# Patient Record
Sex: Female | Born: 2000 | Race: Black or African American | Hispanic: No | Marital: Single | Smoking: Current every day smoker
Health system: Southern US, Community
[De-identification: ages and names within clinical notes are randomized; demographics above are authoritative.]

## PROBLEM LIST (undated history)

## (undated) DIAGNOSIS — S069X9A Unspecified intracranial injury with loss of consciousness of unspecified duration, initial encounter: Secondary | ICD-10-CM

## (undated) DIAGNOSIS — Z8489 Family history of other specified conditions: Secondary | ICD-10-CM

## (undated) DIAGNOSIS — F329 Major depressive disorder, single episode, unspecified: Secondary | ICD-10-CM

## (undated) DIAGNOSIS — N739 Female pelvic inflammatory disease, unspecified: Secondary | ICD-10-CM

## (undated) DIAGNOSIS — D573 Sickle-cell trait: Secondary | ICD-10-CM

## (undated) DIAGNOSIS — R569 Unspecified convulsions: Secondary | ICD-10-CM

## (undated) DIAGNOSIS — J45909 Unspecified asthma, uncomplicated: Secondary | ICD-10-CM

## (undated) DIAGNOSIS — F32A Depression, unspecified: Secondary | ICD-10-CM

## (undated) DIAGNOSIS — F419 Anxiety disorder, unspecified: Secondary | ICD-10-CM

## (undated) DIAGNOSIS — Q620 Congenital hydronephrosis: Secondary | ICD-10-CM

## (undated) DIAGNOSIS — K5900 Constipation, unspecified: Secondary | ICD-10-CM

## (undated) DIAGNOSIS — D649 Anemia, unspecified: Secondary | ICD-10-CM

## (undated) DIAGNOSIS — A6000 Herpesviral infection of urogenital system, unspecified: Secondary | ICD-10-CM

## (undated) DIAGNOSIS — R519 Headache, unspecified: Secondary | ICD-10-CM

## (undated) DIAGNOSIS — T8859XA Other complications of anesthesia, initial encounter: Secondary | ICD-10-CM

## (undated) HISTORY — PX: HERNIA REPAIR: SHX51

## (undated) HISTORY — PX: APPENDECTOMY: SHX54

---
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
## 2019-01-09 ENCOUNTER — Ambulatory Visit (HOSPITAL_COMMUNITY)
Admission: RE | Admit: 2019-01-09 | Discharge: 2019-01-09 | Disposition: A | Discharge status: Home / Self Care | Payer: Medicaid Other | Source: Ambulatory Visit | Attending: Advanced Practice Midwife | Admitting: Advanced Practice Midwife

## 2019-01-09 ENCOUNTER — Other Ambulatory Visit: Payer: Self-pay

## 2019-01-09 ENCOUNTER — Ambulatory Visit (INDEPENDENT_AMBULATORY_CARE_PROVIDER_SITE_OTHER): Payer: Medicaid Other | Admitting: *Deleted

## 2019-01-09 VITALS — BP 107/79 | HR 84 | Temp 98.2°F | Ht 66.0 in | Wt 169.4 lb

## 2019-01-09 DIAGNOSIS — Z712 Person consulting for explanation of examination or test findings: Secondary | ICD-10-CM

## 2019-01-09 DIAGNOSIS — O3680X0 Pregnancy with inconclusive fetal viability, not applicable or unspecified: Secondary | ICD-10-CM | POA: Diagnosis present

## 2019-01-09 NOTE — Progress Notes (Signed)
Pt presents for ultrasound results. Pt went to MAU on 5/11 for abdominal pain and vaginal bleeding. Ultrasound at that time did not sow embryo. Reviewed current ultrasound results and pt history with Dr. Marice Potter.  Pt advised to begin prenatal care and pt states she is already taking a prenatal vitamin.  Pt undecided on where she wants to begin prenatal care.  Pt verbalized understanding.

## 2019-01-10 NOTE — Progress Notes (Signed)
I have reviewed this chart and agree with the RN/CMA assessment and management.    Henri Baumler C Kyen Taite, MD, FACOG Attending Physician, [facility]  

## 2019-02-05 ENCOUNTER — Encounter: Payer: Self-pay | Admitting: Obstetrics & Gynecology

## 2019-02-05 ENCOUNTER — Other Ambulatory Visit (HOSPITAL_COMMUNITY)
Admission: RE | Admit: 2019-02-05 | Discharge: 2019-02-05 | Disposition: A | Discharge status: Home / Self Care | Payer: Medicaid Other | Source: Ambulatory Visit | Attending: Obstetrics & Gynecology | Admitting: Obstetrics & Gynecology

## 2019-02-05 ENCOUNTER — Ambulatory Visit (INDEPENDENT_AMBULATORY_CARE_PROVIDER_SITE_OTHER): Payer: Medicaid Other | Admitting: Obstetrics & Gynecology

## 2019-02-05 ENCOUNTER — Other Ambulatory Visit: Payer: Self-pay

## 2019-02-05 DIAGNOSIS — O0991 Supervision of high risk pregnancy, unspecified, first trimester: Secondary | ICD-10-CM

## 2019-02-05 DIAGNOSIS — O26899 Other specified pregnancy related conditions, unspecified trimester: Secondary | ICD-10-CM

## 2019-02-05 DIAGNOSIS — Z3A10 10 weeks gestation of pregnancy: Secondary | ICD-10-CM | POA: Diagnosis not present

## 2019-02-05 DIAGNOSIS — O26891 Other specified pregnancy related conditions, first trimester: Secondary | ICD-10-CM | POA: Diagnosis not present

## 2019-02-05 DIAGNOSIS — Z6791 Unspecified blood type, Rh negative: Secondary | ICD-10-CM | POA: Diagnosis present

## 2019-02-05 DIAGNOSIS — O09892 Supervision of other high risk pregnancies, second trimester: Secondary | ICD-10-CM | POA: Insufficient documentation

## 2019-02-05 DIAGNOSIS — O360910 Maternal care for other rhesus isoimmunization, first trimester, not applicable or unspecified: Secondary | ICD-10-CM

## 2019-02-05 DIAGNOSIS — O0990 Supervision of high risk pregnancy, unspecified, unspecified trimester: Secondary | ICD-10-CM | POA: Diagnosis present

## 2019-02-05 HISTORY — DX: Other specified pregnancy related conditions, unspecified trimester: O26.899

## 2019-02-05 HISTORY — DX: Unspecified blood type, rh negative: Z67.91

## 2019-02-05 MED ORDER — BLOOD PRESSURE MONITORING KIT: 1.0000 | PACK | 0 refills | Status: DC

## 2019-02-05 NOTE — Patient Instructions (Signed)
First Trimester of Pregnancy  The first trimester of pregnancy is from week 1 until the end of week 13 (months 1 through 3). A week after a sperm fertilizes an egg, the egg will implant on the wall of the uterus. This embryo will begin to develop into a baby. Genes from you and your partner will form the baby. The female genes will determine whether the baby will be a boy or a girl. At 6-8 weeks, the eyes and face will be formed, and the heartbeat can be seen on ultrasound. At the end of 12 weeks, all the baby's organs will be formed.  Now that you are pregnant, you will want to do everything you can to have a healthy baby. Two of the most important things are to get good prenatal care and to follow your health care provider's instructions. Prenatal care is all the medical care you receive before the baby's birth. This care will help prevent, find, and treat any problems during the pregnancy and childbirth.  Body changes during your first trimester  Your body goes through many changes during pregnancy. The changes vary from woman to woman.   You may gain or lose a couple of pounds at first.   You may feel sick to your stomach (nauseous) and you may throw up (vomit). If the vomiting is uncontrollable, call your health care provider.   You may tire easily.   You may develop headaches that can be relieved by medicines. All medicines should be approved by your health care provider.   You may urinate more often. Painful urination may mean you have a bladder infection.   You may develop heartburn as a result of your pregnancy.   You may develop constipation because certain hormones are causing the muscles that push stool through your intestines to slow down.   You may develop hemorrhoids or swollen veins (varicose veins).   Your breasts may begin to grow larger and become tender. Your nipples may stick out more, and the tissue that surrounds them (areola) may become darker.   Your gums may bleed and may be  sensitive to brushing and flossing.   Dark spots or blotches (chloasma, mask of pregnancy) may develop on your face. This will likely fade after the baby is born.   Your menstrual periods will stop.   You may have a loss of appetite.   You may develop cravings for certain kinds of food.   You may have changes in your emotions from day to day, such as being excited to be pregnant or being concerned that something may go wrong with the pregnancy and baby.   You may have more vivid and strange dreams.   You may have changes in your hair. These can include thickening of your hair, rapid growth, and changes in texture. Some women also have hair loss during or after pregnancy, or hair that feels dry or thin. Your hair will most likely return to normal after your baby is born.  What to expect at prenatal visits  During a routine prenatal visit:   You will be weighed to make sure you and the baby are growing normally.   Your blood pressure will be taken.   Your abdomen will be measured to track your baby's growth.   The fetal heartbeat will be listened to between weeks 10 and 14 of your pregnancy.   Test results from any previous visits will be discussed.  Your health care provider may ask you:     How you are feeling.   If you are feeling the baby move.   If you have had any abnormal symptoms, such as leaking fluid, bleeding, severe headaches, or abdominal cramping.   If you are using any tobacco products, including cigarettes, chewing tobacco, and electronic cigarettes.   If you have any questions.  Other tests that may be performed during your first trimester include:   Blood tests to find your blood type and to check for the presence of any previous infections. The tests will also be used to check for low iron levels (anemia) and protein on red blood cells (Rh antibodies). Depending on your risk factors, or if you previously had diabetes during pregnancy, you may have tests to check for high blood sugar  that affects pregnant women (gestational diabetes).   Urine tests to check for infections, diabetes, or protein in the urine.   An ultrasound to confirm the proper growth and development of the baby.   Fetal screens for spinal cord problems (spina bifida) and Down syndrome.   HIV (human immunodeficiency virus) testing. Routine prenatal testing includes screening for HIV, unless you choose not to have this test.   You may need other tests to make sure you and the baby are doing well.  Follow these instructions at home:  Medicines   Follow your health care provider's instructions regarding medicine use. Specific medicines may be either safe or unsafe to take during pregnancy.   Take a prenatal vitamin that contains at least 600 micrograms (mcg) of folic acid.   If you develop constipation, try taking a stool softener if your health care provider approves.  Eating and drinking     Eat a balanced diet that includes fresh fruits and vegetables, whole grains, good sources of protein such as meat, eggs, or tofu, and low-fat dairy. Your health care provider will help you determine the amount of weight gain that is right for you.   Avoid raw meat and uncooked cheese. These carry germs that can cause birth defects in the baby.   Eating four or five small meals rather than three large meals a day may help relieve nausea and vomiting. If you start to feel nauseous, eating a few soda crackers can be helpful. Drinking liquids between meals, instead of during meals, also seems to help ease nausea and vomiting.   Limit foods that are high in fat and processed sugars, such as fried and sweet foods.   To prevent constipation:  ? Eat foods that are high in fiber, such as fresh fruits and vegetables, whole grains, and beans.  ? Drink enough fluid to keep your urine clear or pale yellow.  Activity   Exercise only as directed by your health care provider. Most women can continue their usual exercise routine during  pregnancy. Try to exercise for 30 minutes at least 5 days a week. Exercising will help you:  ? Control your weight.  ? Stay in shape.  ? Be prepared for labor and delivery.   Experiencing pain or cramping in the lower abdomen or lower back is a good sign that you should stop exercising. Check with your health care provider before continuing with normal exercises.   Try to avoid standing for long periods of time. Move your legs often if you must stand in one place for a long time.   Avoid heavy lifting.   Wear low-heeled shoes and practice good posture.   You may continue to have sex unless your health care   provider tells you not to.  Relieving pain and discomfort   Wear a good support bra to relieve breast tenderness.   Take warm sitz baths to soothe any pain or discomfort caused by hemorrhoids. Use hemorrhoid cream if your health care provider approves.   Rest with your legs elevated if you have leg cramps or low back pain.   If you develop varicose veins in your legs, wear support hose. Elevate your feet for 15 minutes, 3-4 times a day. Limit salt in your diet.  Prenatal care   Schedule your prenatal visits by the twelfth week of pregnancy. They are usually scheduled monthly at first, then more often in the last 2 months before delivery.   Write down your questions. Take them to your prenatal visits.   Keep all your prenatal visits as told by your health care provider. This is important.  Safety   Wear your seat belt at all times when driving.   Make a list of emergency phone numbers, including numbers for family, friends, the hospital, and police and fire departments.  General instructions   Ask your health care provider for a referral to a local prenatal education class. Begin classes no later than the beginning of month 6 of your pregnancy.   Ask for help if you have counseling or nutritional needs during pregnancy. Your health care provider can offer advice or refer you to specialists for help  with various needs.   Do not use hot tubs, steam rooms, or saunas.   Do not douche or use tampons or scented sanitary pads.   Do not cross your legs for long periods of time.   Avoid cat litter boxes and soil used by cats. These carry germs that can cause birth defects in the baby and possibly loss of the fetus by miscarriage or stillbirth.   Avoid all smoking, herbs, alcohol, and medicines not prescribed by your health care provider. Chemicals in these products affect the formation and growth of the baby.   Do not use any products that contain nicotine or tobacco, such as cigarettes and e-cigarettes. If you need help quitting, ask your health care provider. You may receive counseling support and other resources to help you quit.   Schedule a dentist appointment. At home, brush your teeth with a soft toothbrush and be gentle when you floss.  Contact a health care provider if:   You have dizziness.   You have mild pelvic cramps, pelvic pressure, or nagging pain in the abdominal area.   You have persistent nausea, vomiting, or diarrhea.   You have a bad smelling vaginal discharge.   You have pain when you urinate.   You notice increased swelling in your face, hands, legs, or ankles.   You are exposed to fifth disease or chickenpox.   You are exposed to German measles (rubella) and have never had it.  Get help right away if:   You have a fever.   You are leaking fluid from your vagina.   You have spotting or bleeding from your vagina.   You have severe abdominal cramping or pain.   You have rapid weight gain or loss.   You vomit blood or material that looks like coffee grounds.   You develop a severe headache.   You have shortness of breath.   You have any kind of trauma, such as from a fall or a car accident.  Summary   The first trimester of pregnancy is from week 1 until   the end of week 13 (months 1 through 3).   Your body goes through many changes during pregnancy. The changes vary from  woman to woman.   You will have routine prenatal visits. During those visits, your health care provider will examine you, discuss any test results you may have, and talk with you about how you are feeling.  This information is not intended to replace advice given to you by your health care provider. Make sure you discuss any questions you have with your health care provider.  Document Released: 08/01/2001 Document Revised: 07/19/2016 Document Reviewed: 07/19/2016  Elsevier Interactive Patient Education  2019 [employer]

## 2019-02-05 NOTE — Progress Notes (Signed)
Subjective: Melissa [name] is a G1P0 at [redacted]w[redacted]d who presents to the [facility] today for ob visit.  Patient reports a history with depression. Patient reports previously receiving treatment with [facility] however she has not been receiving services from [facility] for approximately six months. She is  currently sexually active. Patient reports using nexplanon for birth control. Patient states family and father of baby as her support system.   BP 108/73   Pulse 86   Temp 97.8 F (36.6 C)   Wt 167 lb (75.8 kg)   LMP 11/22/2018 (Exact Date)   BMI 26.95 kg/m   Birth Control History:  nexplanon  MDM Patient counseled on all options for birth control today including LARC. Patient desires PP IUD initiated for birth control.  Assessment:  18 y.o. female considering PP IUDfor birth control  Plan: Patient requested assistance for counseling. Referral sent via fax (02/05/2019) to [facility].   Lynnea Ferrier, LCSW 02/05/2019 11:32 PM

## 2019-02-05 NOTE — Progress Notes (Signed)
Subjective:     Melissa RutherfordBryana M Kellam [name] is a G1P0 7429w5d being seen today for her first obstetrical visit.  Her obstetrical history is significant for first pregnancy, EDC by LMP and 6 week US. Patient does intend to breast feed. Pregnancy history fully reviewed.  Patient reports nausea.  Vitals:   02/05/19 1322  BP: 108/73  Pulse: 86  Temp: 97.8 F (36.6 C)  Weight: 167 lb (75.8 kg)    HISTORY: OB History  Gravida Para Term Preterm AB Living  1            SAB TAB Ectopic Multiple Live Births               # Outcome Date GA Lbr Len/2nd Weight Sex Delivery Anes PTL Lv  1 Current            Past Medical History:  Diagnosis Date  . Anxiety   . Asthma    inhaler. last attack June 2019  . Asthma   . Congenital hydronephrosis 2001  . Constipation   . Depression   . Seizures ([name])    last one in 2015 - on meds  . Seizures ([name])   . TBI (traumatic brain injury) ([name]) 2006   Past Surgical History:  Procedure Laterality Date  . APPENDECTOMY    . HERNIA REPAIR    . INTESTINAL MALROTATION REPAIR  2001   Family History  Problem Relation Age of Onset  . Depression Mother   . Stroke Mother   . Obesity Mother   . Post-traumatic stress disorder Mother   . Anxiety disorder Mother   . Schizophrenia Father      Exam    Uterus:     Pelvic Exam:    Perineum: No Hemorrhoids   Vulva: normal   Vagina:  curdlike discharge   pH:     Cervix: no lesions   Adnexa: normal adnexa   Bony Pelvis: average  System: Breast:  normal appearance, no masses or tenderness   Skin: normal coloration and turgor, no rashes    Neurologic: oriented, normal mood   Extremities: normal strength, tone, and muscle mass   HEENT PERRLA   Mouth/Teeth mucous membranes moist, pharynx normal without lesions and dental hygiene good   Neck supple   Cardiovascular: regular rate and rhythm, no murmurs or gallops   Respiratory:  appears well, vitals normal, no respiratory distress, acyanotic, normal  RR, neck free of mass or lymphadenopathy, chest clear, no wheezing, crepitations, rhonchi, normal symmetric air entry   Abdomen: soft, non-tender; bowel sounds normal; no masses,  no organomegaly   Urinary: urethral meatus normal      Assessment:    Pregnancy: G1P0 Patient Active Problem List   Diagnosis Date Noted  . Rh negative state in antepartum period 02/05/2019  . Supervision of high risk pregnancy, antepartum 02/05/2019  . 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 DMDD (disruptive mood dysregulation disorder) ([name]) 07/21/2016  . 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 Bilateral low back pain without sciatica 03/08/2016  . Acne vulgaris 08/03/2015  . Chronic constipation 08/03/2015  . 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 Migraine without aura and without status migrainosus, not intractable 04/16/2015  . Episodic tension-type headache, not intractable 04/16/2015  . 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 GAD (generalized anxiety disorder) 11/25/2013  . Suicide attempt by drug ingestion ([name]) 11/18/2013        Plan:  Initial labs drawn. Prenatal vitamins. Problem list reviewed and updated. Genetic Screening discussed {Panorama)  Ultrasound discussed; fetal survey: ordered.  Follow up at 19-20 weeks. 50% of 30 min visit spent on counseling and coordination of care.  Babyscripts, MyChart app   Emeterio Reeve 02/05/2019
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
## 2019-02-06 ENCOUNTER — Other Ambulatory Visit: Payer: Self-pay | Admitting: Obstetrics & Gynecology

## 2019-02-06 DIAGNOSIS — O0990 Supervision of high risk pregnancy, unspecified, unspecified trimester: Secondary | ICD-10-CM

## 2019-02-06 LAB — OBSTETRIC PANEL, INCLUDING HIV
Basophils Absolute: 0 10*3/uL (ref 0.0–0.2)
Basos: 0 %
EOS (ABSOLUTE): 0.1 10*3/uL (ref 0.0–0.4)
Eos: 2 %
HIV Screen 4th Generation wRfx: NONREACTIVE
Hematocrit: 38 % (ref 34.0–46.6)
Hemoglobin: 12.9 g/dL (ref 11.1–15.9)
Hepatitis B Surface Ag: NEGATIVE
Immature Grans (Abs): 0 10*3/uL (ref 0.0–0.1)
Immature Granulocytes: 0 %
Lymphocytes Absolute: 1.9 10*3/uL (ref 0.7–3.1)
Lymphs: 34 %
MCH: 27.7 pg (ref 26.6–33.0)
MCHC: 33.9 g/dL (ref 31.5–35.7)
MCV: 82 fL (ref 79–97)
Monocytes Absolute: 0.6 10*3/uL (ref 0.1–0.9)
Monocytes: 12 %
Neutrophils Absolute: 2.8 10*3/uL (ref 1.4–7.0)
Neutrophils: 52 %
Platelets: 201 10*3/uL (ref 150–450)
RBC: 4.65 x10E6/uL (ref 3.77–5.28)
RDW: 18 % — ABNORMAL HIGH (ref 11.7–15.4)
RPR Ser Ql: NONREACTIVE
Rh Factor: NEGATIVE
Rubella Antibodies, IGG: 10.6 index (ref >0.99)
WBC: 5.4 10*3/uL (ref 3.4–10.8)

## 2019-02-06 LAB — AB SCR+ANTIBODY ID: Antibody Screen: POSITIVE — AB

## 2019-02-06 LAB — CERVICOVAGINAL ANCILLARY ONLY
Bacterial vaginitis: POSITIVE — AB
Candida vaginitis: POSITIVE — AB
Chlamydia: NEGATIVE
Neisseria Gonorrhea: NEGATIVE
Trichomonas: NEGATIVE

## 2019-02-06 MED ORDER — FLUCONAZOLE 150 MG PO TABS: 150.0000 mg | ORAL_TABLET | Freq: Once | ORAL | 0 refills | Status: AC

## 2019-02-06 MED ORDER — METRONIDAZOLE 500 MG PO TABS: 500.0000 mg | ORAL_TABLET | Freq: Two times a day (BID) | ORAL | 0 refills | Status: DC

## 2019-02-07 LAB — CULTURE, OB URINE

## 2019-02-07 LAB — URINE CULTURE, OB REFLEX

## 2019-02-12 ENCOUNTER — Encounter: Payer: Self-pay | Admitting: Obstetrics & Gynecology

## 2019-02-13 ENCOUNTER — Encounter: Payer: Self-pay | Admitting: Obstetrics & Gynecology

## 2019-02-13 DIAGNOSIS — D573 Sickle-cell trait: Secondary | ICD-10-CM | POA: Insufficient documentation

## 2019-03-15 ENCOUNTER — Inpatient Hospital Stay (HOSPITAL_COMMUNITY)
Admission: AD | Admit: 2019-03-15 | Discharge: 2019-03-15 | Disposition: A | Discharge status: Home / Self Care | Payer: Medicaid Other | Attending: Obstetrics and Gynecology | Admitting: Obstetrics and Gynecology

## 2019-03-15 ENCOUNTER — Encounter (HOSPITAL_COMMUNITY): Payer: Self-pay | Admitting: *Deleted

## 2019-03-15 ENCOUNTER — Other Ambulatory Visit: Payer: Self-pay

## 2019-03-15 DIAGNOSIS — Z87891 Personal history of nicotine dependence: Secondary | ICD-10-CM | POA: Insufficient documentation

## 2019-03-15 DIAGNOSIS — O99612 Diseases of the digestive system complicating pregnancy, second trimester: Secondary | ICD-10-CM | POA: Diagnosis not present

## 2019-03-15 DIAGNOSIS — R102 Pelvic and perineal pain: Secondary | ICD-10-CM

## 2019-03-15 DIAGNOSIS — R109 Unspecified abdominal pain: Secondary | ICD-10-CM | POA: Diagnosis not present

## 2019-03-15 DIAGNOSIS — K5900 Constipation, unspecified: Secondary | ICD-10-CM | POA: Diagnosis not present

## 2019-03-15 DIAGNOSIS — Z3A16 16 weeks gestation of pregnancy: Secondary | ICD-10-CM | POA: Diagnosis not present

## 2019-03-15 DIAGNOSIS — J45901 Unspecified asthma with (acute) exacerbation: Secondary | ICD-10-CM

## 2019-03-15 DIAGNOSIS — O26892 Other specified pregnancy related conditions, second trimester: Secondary | ICD-10-CM

## 2019-03-15 DIAGNOSIS — O26899 Other specified pregnancy related conditions, unspecified trimester: Secondary | ICD-10-CM

## 2019-03-15 LAB — URINALYSIS, ROUTINE W REFLEX MICROSCOPIC
Bilirubin Urine: NEGATIVE
Glucose, UA: NEGATIVE mg/dL
Hgb urine dipstick: NEGATIVE
Ketones, ur: NEGATIVE mg/dL
Leukocytes,Ua: NEGATIVE
Nitrite: NEGATIVE
Protein, ur: NEGATIVE mg/dL
Specific Gravity, Urine: 1.013 (ref 1.005–1.030)
pH: 7 (ref 5.0–8.0)

## 2019-03-15 MED ORDER — ALBUTEROL SULFATE HFA 108 (90 BASE) MCG/ACT IN AERS: 2.0000 | INHALATION_SPRAY | Freq: Four times a day (QID) | RESPIRATORY_TRACT | 1 refills | Status: DC | PRN

## 2019-03-15 NOTE — Discharge Instructions (Signed)
Round Ligament Pain  The round ligament is a cord of muscle and tissue that helps support the uterus. It can become a source of pain during pregnancy if it becomes stretched or twisted as the baby grows. The pain usually begins in the second trimester (13-28 weeks) of pregnancy, and it can come and go until the baby is delivered. It is not a serious problem, and it does not cause harm to the baby. Round ligament pain is usually a short, sharp, and pinching pain, but it can also be a dull, lingering, and aching pain. The pain is felt in the lower side of the abdomen or in the groin. It usually starts deep in the groin and moves up to the outside of the hip area. The pain may occur when you:  Suddenly change position, such as quickly going from a sitting to standing position.  Roll over in bed.  Cough or sneeze.  Do physical activity. Follow these instructions at home:   Watch your condition for any changes.  When the pain starts, relax. Then try any of these methods to help with the pain: ? Sitting down. ? Flexing your knees up to your abdomen. ? Lying on your side with one pillow under your abdomen and another pillow between your legs. ? Sitting in a warm bath for 15-20 minutes or until the pain goes away.  Take over-the-counter and prescription medicines only as told by your health care provider.  Move slowly when you sit down or stand up.  Avoid long walks if they cause pain.  Stop or reduce your physical activities if they cause pain.  Keep all follow-up visits as told by your health care provider. This is important. Contact a health care provider if:  Your pain does not go away with treatment.  You feel pain in your back that you did not have before.  Your medicine is not helping. Get help right away if:  You have a fever or chills.  You develop uterine contractions.  You have vaginal bleeding.  You have nausea or vomiting.  You have diarrhea.  You have pain  when you urinate. Summary  Round ligament pain is felt in the lower abdomen or groin. It is usually a short, sharp, and pinching pain. It can also be a dull, lingering, and aching pain.  This pain usually begins in the second trimester (13-28 weeks). It occurs because the uterus is stretching with the growing baby, and it is not harmful to the baby.  You may notice the pain when you suddenly change position, when you cough or sneeze, or during physical activity.  Relaxing, flexing your knees to your abdomen, lying on one side, or taking a warm bath may help to get rid of the pain.  Get help from your health care provider if the pain does not go away or if you have vaginal bleeding, nausea, vomiting, diarrhea, or painful urination. This information is not intended to replace advice given to you by your health care provider. Make sure you discuss any questions you have with your health care provider. Document Released: 05/16/2008 Document Revised: 01/23/2018 Document Reviewed: 01/23/2018 Elsevier Patient Education  2020 Elsevier Inc.  

## 2019-03-15 NOTE — MAU Note (Signed)
Melissa [name] is a 18 y.o. at [redacted]w[redacted]d here in MAU reporting:  +abdominal pain between mid and lower right side Intermittent  Sharp and cramping States she was told she does have cyst.  Onset of complaint: 3 days ago Pain score: 8/10 Has tried tylenol at first. Endorses that it did not help.   Vaginal bleeding: denies Vaginal discharge: denies Vitals:   03/15/19 1614  BP: 116/75  Pulse: 97  Resp: 16  Temp: 98.3 F (36.8 C)  SpO2: 100%    FHT: attempted in triage; will retry in patient room where patient can lay down. Patient to room. Lab orders placed from triage: ua

## 2019-03-15 NOTE — MAU Provider Note (Signed)
History     CSN: [id number]  Arrival date and time: 03/15/19 1552   None     Chief Complaint  Patient presents with  . Abdominal Pain   HPI   Melissa [name] is 18 y.o. G1P0 female at 68w1dwho presents for abdominal pain. Patient reports it started 3 days ago. Pain occurs in lower abdominal region/groin area. Occurs on left and right side. Pain is intermittent and usually lasts a few minutes. Describes pain as a sharp, stabbing pain. Has a history of appendectomy. Mostly having normal bowel movements, sometimes becomes constipated based on her diet.    OB History    Gravida  1   Para      Term      Preterm      AB      Living        SAB      TAB      Ectopic      Multiple      Live Births              Past Medical History:  Diagnosis Date  . Anxiety   . Asthma    inhaler. last attack June 2019  . Asthma   . Congenital hydronephrosis 2001  . Constipation   . Depression   . [place]    last one in 2015 - on meds  . Seizures ([facility]   . TBI (traumatic brain injury) ([facility] 2006    Past Surgical History:  Procedure Laterality Date  . APPENDECTOMY    . HERNIA REPAIR    . INTESTINAL MALROTATION REPAIR  2001    Family History  Problem Relation Age of Onset  . Depression Mother   . Stroke Mother   . Obesity Mother   . Post-traumatic stress disorder Mother   . Anxiety disorder Mother   . Schizophrenia Father     Social History   Tobacco Use  . Smoking status: Former SResearch scientist (life sciences) . Smokeless tobacco: Never Used  . Tobacco comment: black and milds, none since + UPT  Substance Use Topics  . Alcohol use: Never    Frequency: Never  . Drug use: Not Currently    Types: Marijuana    Comment: Episodic    Allergies:  Allergies  Allergen Reactions  . Benadryl [Diphenhydramine Hcl] Other (See Comments)    Causes seizures   . Gluten Meal Nausea And Vomiting  . Zithromax [Azithromycin] Anaphylaxis and Swelling  . Azithromycin Other  (See Comments)  . Benadryl [Diphenhydramine] Other (See Comments)  . Lactose Intolerance (Gi) Diarrhea    Medications Prior to Admission  Medication Sig Dispense Refill Last Dose  . albuterol (PROVENTIL) (2.5 MG/3ML) 0.083% nebulizer solution Take 3 mLs (2.5 mg total) by nebulization every 6 (six) hours as needed for wheezing or shortness of breath (IF inhaler is not strong enough). 150 mL 1   . albuterol (VENTOLIN HFA) 108 (90 Base) MCG/ACT inhaler Inhale 2 puffs into the lungs every 6 (six) hours as needed for wheezing or shortness of breath (shortness of breath from reactive airway disease). 1 Inhaler 0   . Blood Pressure Monitoring KIT 1 kit by Does not apply route once a week. 1 kit 0   . cetirizine (ZYRTEC) 10 MG tablet Take 1 tablet (10 mg total) by mouth daily as needed for allergies. (Patient not taking: Reported on 01/09/2019) 30 tablet 11   . ferrous sulfate (IRON SUPPLEMENT) 325 (65 FE) MG tablet Take 325  mg by mouth daily with breakfast.     . fluticasone (FLONASE) 50 MCG/ACT nasal spray Place 2 sprays into both nostrils daily. 16 g 11   . metroNIDAZOLE (FLAGYL) 500 MG tablet Take 1 tablet (500 mg total) by mouth 2 (two) times daily. 14 tablet 0   . Prenatal Vit-Fe Fumarate-FA (PRENATAL MULTIVITAMIN) TABS tablet Take 1 tablet by mouth daily at 12 noon.       Review of Systems  Constitutional: Negative for activity change, appetite change and fever.  HENT: Negative for congestion.   Respiratory: Negative for chest tightness and shortness of breath.   Cardiovascular: Negative for chest pain.  Gastrointestinal: Positive for abdominal pain and constipation. Negative for abdominal distention, diarrhea, nausea and vomiting.  Genitourinary: Negative for dysuria, frequency and hematuria.  Musculoskeletal: Positive for back pain.  Neurological: Negative for light-headedness.   Physical Exam   Blood pressure 116/75, pulse 97, temperature 98.3 F (36.8 C), temperature source Oral,  resp. rate 16, weight 76.7 kg, last menstrual period 11/22/2018, SpO2 100 %.  Physical Exam  Constitutional: She is oriented to person, place, and time. She appears well-developed and well-nourished. No distress.  HENT:  Head: Normocephalic and atraumatic.  Eyes: Conjunctivae and EOM are normal.  Neck: Normal range of motion. Neck supple.  Cardiovascular: Normal rate, regular rhythm and normal heart sounds.  Respiratory: Effort normal and breath sounds normal. No respiratory distress.  GI: Soft. Bowel sounds are normal. She exhibits no distension. There is no rebound and no guarding.   Mild TTP over round ligament region on right side, otherwise no TTP in all 4 quadrants.   Musculoskeletal: Normal range of motion.        General: No edema.  Neurological: She is alert and oriented to person, place, and time.  Skin: Skin is warm and dry. She is not diaphoretic.     FHR by doppler: 153 bpm   MAU Course  Procedures  MDM FHR normal. Exam performed. UA normal.   Assessment and Plan   1. Pain of round ligament during pregnancy   2. Abdominal pain during pregnancy in second trimester   3. Constipation, unspecified constipation type    Etiology of pain likely related to round ligament pain given second trimester of pregnancy, location of pain and description. Patient is well appearing, abdominal exam is benign and vital signs are stable. Discussed supportive care measures for round ligament pain. Also discussed that pregnancy is constipating and constipation can worsen abdominal pain. Encouraged diet rich in fiber and adequate hydration. Return precautions discussed.   Melina Schools 03/15/2019, 4:25 PM

## 2019-03-30 ENCOUNTER — Inpatient Hospital Stay (HOSPITAL_COMMUNITY)
Admission: AD | Admit: 2019-03-30 | Discharge: 2019-03-30 | Disposition: A | Discharge status: Home / Self Care | Payer: Medicaid Other | Attending: Obstetrics and Gynecology | Admitting: Obstetrics and Gynecology

## 2019-03-30 ENCOUNTER — Other Ambulatory Visit: Payer: Self-pay

## 2019-03-30 ENCOUNTER — Encounter (HOSPITAL_COMMUNITY): Payer: Self-pay | Admitting: Student

## 2019-03-30 DIAGNOSIS — R42 Dizziness and giddiness: Secondary | ICD-10-CM | POA: Diagnosis present

## 2019-03-30 DIAGNOSIS — R51 Headache: Secondary | ICD-10-CM | POA: Insufficient documentation

## 2019-03-30 DIAGNOSIS — Z87891 Personal history of nicotine dependence: Secondary | ICD-10-CM | POA: Insufficient documentation

## 2019-03-30 DIAGNOSIS — O26892 Other specified pregnancy related conditions, second trimester: Secondary | ICD-10-CM | POA: Insufficient documentation

## 2019-03-30 DIAGNOSIS — R519 Headache, unspecified: Secondary | ICD-10-CM

## 2019-03-30 DIAGNOSIS — Z3A18 18 weeks gestation of pregnancy: Secondary | ICD-10-CM | POA: Diagnosis not present

## 2019-03-30 LAB — URINALYSIS, ROUTINE W REFLEX MICROSCOPIC
Bilirubin Urine: NEGATIVE
Glucose, UA: NEGATIVE mg/dL
Hgb urine dipstick: NEGATIVE
Ketones, ur: NEGATIVE mg/dL
Leukocytes,Ua: NEGATIVE
Nitrite: NEGATIVE
Protein, ur: NEGATIVE mg/dL
Specific Gravity, Urine: 1.003 — ABNORMAL LOW (ref 1.005–1.030)
pH: 7 (ref 5.0–8.0)

## 2019-03-30 MED ORDER — CYCLOBENZAPRINE HCL 5 MG PO TABS: 5.0000 mg | ORAL_TABLET | Freq: Three times a day (TID) | ORAL | 0 refills | Status: DC | PRN

## 2019-03-30 MED ORDER — CYCLOBENZAPRINE HCL 10 MG PO TABS
10.0000 mg | ORAL_TABLET | Freq: Once | ORAL | Status: AC
  Administered 2019-03-30: 10 mg via ORAL
  Filled 2019-03-30: qty 1

## 2019-03-30 MED ORDER — ACETAMINOPHEN 500 MG PO TABS
1000.0000 mg | ORAL_TABLET | Freq: Once | ORAL | Status: AC
  Administered 2019-03-30: 1000 mg via ORAL
  Filled 2019-03-30: qty 2

## 2019-03-30 NOTE — Discharge Instructions (Signed)
General Headache Without Cause A headache is pain or discomfort felt around the head or neck area. The specific cause of a headache may not be found. There are many causes and types of headaches. A few common ones are:  Tension headaches.  Migraine headaches.  Cluster headaches.  Chronic daily headaches. Follow these instructions at home: Watch your condition for any changes. Let your health care provider know about them. Take these steps to help with your condition: Managing pain      Take over-the-counter and prescription medicines only as told by your health care provider.  Lie down in a dark, quiet room when you have a headache.  If directed, put ice on your head and neck area: ? Put ice in a plastic bag. ? Place a towel between your skin and the bag. ? Leave the ice on for 20 minutes, 2-3 times per day.  If directed, apply heat to the affected area. Use the heat source that your health care provider recommends, such as a moist heat pack or a heating pad. ? Place a towel between your skin and the heat source. ? Leave the heat on for 20-30 minutes. ? Remove the heat if your skin turns bright red. This is especially important if you are unable to feel pain, heat, or cold. You may have a greater risk of getting burned.  Keep lights dim if bright lights bother you or make your headaches worse. Eating and drinking  Eat meals on a regular schedule.  If you drink alcohol: ? Limit how much you use to:  0-1 drink a day for women.  0-2 drinks a day for men. ? Be aware of how much alcohol is in your drink. In the [place] one drink equals one 12 oz bottle of beer (355 mL), one 5 oz glass of wine (148 mL), or one 1 oz glass of hard liquor (44 mL).  Stop drinking caffeine, or decrease the amount of caffeine you drink. General instructions   Keep a headache journal to help find out what may trigger your headaches. For example, write down: ? What you eat and drink. ? How much  sleep you get. ? Any change to your diet or medicines.  Try massage or other relaxation techniques.  Limit stress.  Sit up straight, and do not tense your muscles.  Do not use any products that contain nicotine or tobacco, such as cigarettes, e-cigarettes, and chewing tobacco. If you need help quitting, ask your health care provider.  Exercise regularly as told by your health care provider.  Sleep on a regular schedule. Get 7-9 hours of sleep each night, or the amount recommended by your health care provider.  Keep all follow-up visits as told by your health care provider. This is important. Contact a health care provider if:  Your symptoms are not helped by medicine.  You have a headache that is different from the usual headache.  You have nausea or you vomit.  You have a fever. Get help right away if:  Your headache becomes severe quickly.  Your headache gets worse after moderate to intense physical activity.  You have repeated vomiting.  You have a stiff neck.  You have a loss of vision.  You have problems with speech.  You have pain in the eye or ear.  You have muscular weakness or loss of muscle control.  You lose your balance or have trouble walking.  You feel faint or pass out.  You have confusion.    You have a seizure. Summary  A headache is pain or discomfort felt around the head or neck area.  There are many causes and types of headaches. In some cases, the cause may not be found.  Keep a headache journal to help find out what may trigger your headaches. Watch your condition for any changes. Let your health care provider know about them.  Contact a health care provider if you have a headache that is different from the usual headache, or if your symptoms are not helped by medicine.  Get help right away if your headache becomes severe, you vomit, you have a loss of vision, you lose your balance, or you have a seizure. This information is not  intended to replace advice given to you by your health care provider. Make sure you discuss any questions you have with your health care provider. Document Released: 08/07/2005 Document Revised: 02/25/2018 Document Reviewed: 02/25/2018 Elsevier Patient Education  2020 [employer] Inc.  

## 2019-03-30 NOTE — MAU Note (Signed)
Melissa [name] is a 18 y.o. at [redacted]w[redacted]d here in MAU reporting: has a headache, feeling dizzy, having blurry vision, and having pain in her legs. States her BP has been high for a couple of days, was 119/92. Also states she feels swollen.  Onset of complaint: last week  Pain score: headache 8/10, leg 9/10  Vitals:   03/30/19 1434  BP: 102/63  Pulse: 97  Resp: 18  Temp: 98.1 F (36.7 C)  SpO2: 100%      Lab orders placed from triage: UA

## 2019-03-30 NOTE — MAU Provider Note (Signed)
Chief Complaint: Headache, Dizziness, and Edema   First Provider Initiated Contact with Patient 03/30/19 1512     SUBJECTIVE HPI: Melissa [name] is a 18 y.o. G1P0 at 65w2dwho presents to Maternity Admissions reporting headache, dizziness, & swelling. Symptoms started about 1.5 wks ago.   Reports intermittent headaches. Has been treating with 1 RS tylenol without relief. Last took tylenol this morning. Has some blurred vision with headache. Was worried about preeclampsia due to an elevated BP at home the other day with DBP in the 90s. States this was while walking around her room. No other elevations and no history of hypertension.  Also reports some swelling in her hands and feet. Denies fever/chils, abdominal pain, n/v/d, or vaginal bleeding.   Location: head Quality: aching Severity: 7/10 on pain scale Duration: 1.5 wk Timing: intermittent Modifying factors: sometimes worse with lights. Not improved with tylenol Associated signs and symptoms: blurred vision  Past Medical History:  Diagnosis Date  . Anxiety   . Asthma    inhaler. last attack June 2019  . 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 Constipation   . Depression   . Seizures ([place]    last one in 2015 - on meds  . TBI (traumatic brain injury) ([facility] 2006   OB History  Gravida Para Term Preterm AB Living  1            SAB TAB Ectopic Multiple Live Births               # Outcome Date GA Lbr Len/2nd Weight Sex Delivery Anes PTL Lv  1 Current            Past Surgical History:  Procedure Laterality Date  . APPENDECTOMY    . HERNIA REPAIR    . INTESTINAL MALROTATION REPAIR  22001-03-20  Social History   Socioeconomic History  . Marital status: Unknown    Spouse name: Not on file  . Number of children: Not on file  . Years of education: Not on file  . Highest education level: Not on file  Occupational History  . Not on file  Social Needs  . Financial resource strain: Not on file  . Food insecurity    Worry: Not  on file    Inability: Not on file  . Transportation needs    Medical: Not on file    Non-medical: Not on file  Tobacco Use  . Smoking status: Former SResearch scientist (life sciences) . Smokeless tobacco: Never Used  . Tobacco comment: black and milds, none since + UPT  Substance and Sexual Activity  . Alcohol use: Never    Frequency: Never  . Drug use: Not Currently    Types: Marijuana    Comment: Episodic  . Sexual activity: Yes    Birth control/protection: None  Lifestyle  . Physical activity    Days per week: Not on file    Minutes per session: Not on file  . Stress: Not on file  Relationships  . Social [place] phone: Not on file    Gets together: Not on file    Attends religious service: Not on file    Active member of club or organization: Not on file    Attends meetings of clubs or organizations: Not on file    Relationship status: Not on file  . Intimate partner violence    Fear of current or ex partner: Not on file    Emotionally abused: Not on file  Physically abused: Not on file    Forced sexual activity: Not on file  Other Topics Concern  . 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 She attends [employer].  She lives with her parents, siblings, and grandfather.  She enjoys writing, singing, dancing, cooking, and shopping.   Family History  Problem Relation Age of Onset  . Depression Mother   . Stroke Mother   . Obesity Mother   . Post-traumatic stress disorder Mother   . Anxiety disorder Mother   . Schizophrenia Father    No current facility-administered medications on file prior to encounter.    Current Outpatient Medications on File Prior to Encounter  Medication Sig Dispense Refill  . albuterol (PROVENTIL) (2.5 MG/3ML) 0.083% nebulizer solution Take 3 mLs (2.5 mg total) by nebulization every 6 (six) hours as needed for wheezing or shortness of breath (IF inhaler is not  strong enough). 150 mL 1  . albuterol (VENTOLIN HFA) 108 (90 Base) MCG/ACT inhaler Inhale 2 puffs into the lungs every 6 (six) hours as needed for wheezing or shortness of breath (shortness of breath from reactive airway disease). 6.7 g 1  . Blood Pressure Monitoring KIT 1 kit by Does not apply route once a week. 1 kit 0  . cetirizine (ZYRTEC) 10 MG tablet Take 1 tablet (10 mg total) by mouth daily as needed for allergies. (Patient not taking: Reported on 01/09/2019) 30 tablet 11  . ferrous sulfate (IRON SUPPLEMENT) 325 (65 FE) MG tablet Take 325 mg by mouth daily with breakfast.    . fluticasone (FLONASE) 50 MCG/ACT nasal spray Place 2 sprays into both nostrils daily. 16 g 11  . metroNIDAZOLE (FLAGYL) 500 MG tablet Take 1 tablet (500 mg total) by mouth 2 (two) times daily. 14 tablet 0  . Prenatal Vit-Fe Fumarate-FA (PRENATAL MULTIVITAMIN) TABS tablet Take 1 tablet by mouth daily at 12 noon.     Allergies  Allergen Reactions  . Benadryl [Diphenhydramine Hcl] Other (See Comments)    Causes seizures   . Gluten Meal Nausea And Vomiting  . Zithromax [Azithromycin] Anaphylaxis and Swelling  . Lactose Intolerance (Gi) Diarrhea    I have reviewed patient's Past Medical Hx, Surgical Hx, Family Hx, Social Hx, medications and allergies.   Review of Systems  Constitutional: Negative.   Eyes: Positive for photophobia and visual disturbance.  Cardiovascular: Positive for leg swelling.  Gastrointestinal: Negative.   Genitourinary: Negative.   Neurological: Positive for headaches.    OBJECTIVE Patient Vitals for the past 24 hrs:  BP Temp Temp src Pulse Resp SpO2 Height Weight  03/30/19 1658 109/73 - - 72 18 - - -  03/30/19 1434 102/63 98.1 F (36.7 C) Oral 97 18 100 % - -  03/30/19 1430 - - - - - - 5' 7" (1.702 m) 78.4 kg   Constitutional: Well-developed, well-nourished female in no acute distress.  Cardiovascular: normal rate & rhythm, no murmur Respiratory: normal rate and effort. Lung  sounds clear throughout GI: Abd soft, non-tender, Pos BS x 4. No guarding or rebound tenderness MS: Extremities nontender, no edema, normal ROM Neurologic: Alert and oriented x 4.    LAB RESULTS Results for orders placed or performed during the hospital encounter of 03/30/19 (from the past 24 hour(s))  Urinalysis, Routine w reflex microscopic     Status: Abnormal   Collection Time: 03/30/19  2:36 PM  Result Value Ref Range   Color, Urine STRAW (A) YELLOW   APPearance CLEAR CLEAR   Specific Gravity, Urine 1.003 (L) 1.005 - 1.030   pH 7.0 5.0 - 8.0   Glucose, UA NEGATIVE NEGATIVE mg/dL   Hgb urine dipstick NEGATIVE NEGATIVE   Bilirubin Urine NEGATIVE NEGATIVE   Ketones, ur NEGATIVE NEGATIVE mg/dL   Protein, ur NEGATIVE NEGATIVE mg/dL   Nitrite NEGATIVE NEGATIVE   Leukocytes,Ua NEGATIVE NEGATIVE    IMAGING No results found.  MAU COURSE Orders Placed This Encounter  Procedures  . Urinalysis, Routine w reflex microscopic  . Discharge patient   Meds ordered this encounter  Medications  . acetaminophen (TYLENOL) tablet 1,000 mg  . cyclobenzaprine (FLEXERIL) tablet 10 mg  . cyclobenzaprine (FLEXERIL) 5 MG tablet    Sig: Take 1 tablet (5 mg total) by mouth 3 (three) times daily as needed (headache (take with tylenol)).    Dispense:  20 tablet    Refill:  0    Order Specific Question:   Supervising Provider    Answer:   Arlina Robes L [[id number]]    MDM FHT present via doppler Pt is normotensive. No swelling on exam.  Tylenol & flexeril given for headache. Pt reports resolution in pain. Will send home with rx for flexeril & discussed proper dosing of tylenol.   ASSESSMENT 1. Pregnancy headache in second trimester   2. [redacted] weeks gestation of pregnancy     PLAN Discharge home in stable condition. Discussed reasons to return to MAU Rx flexeril  Follow-up Information    [facility] Follow up.   Specialty: Obstetrics and Gynecology Why: return for  worsening symptoms Contact information: [address] [id number] [place] [phone]         Allergies as of 03/30/2019      Reactions   Benadryl [diphenhydramine Hcl] Other (See Comments)   Causes seizures   Gluten Meal Nausea And Vomiting   Zithromax [azithromycin] Anaphylaxis, Swelling   Lactose Intolerance (gi) Diarrhea      Medication List    STOP taking these medications   cetirizine 10 MG tablet Commonly known as: ZYRTEC   metroNIDAZOLE 500 MG tablet Commonly known as: FLAGYL     TAKE these medications   albuterol (2.5 MG/3ML) 0.083% nebulizer solution Commonly known as: PROVENTIL Take 3 mLs (2.5 mg total) by nebulization every 6 (six) hours as needed for wheezing or shortness of breath (IF inhaler is not strong enough).   albuterol 108 (90 Base) MCG/ACT inhaler Commonly known as: VENTOLIN HFA Inhale 2 puffs into the lungs every 6 (six) hours as needed for wheezing or shortness of breath (shortness of breath from reactive airway disease).   Blood Pressure Monitoring Kit 1 kit by Does not apply route once a week.   cyclobenzaprine 5 MG tablet Commonly known as: FLEXERIL Take 1 tablet (5 mg total) by mouth 3 (three) times daily as needed (headache (take with tylenol)).   fluticasone 50 MCG/ACT nasal spray Commonly known as: FLONASE Place 2 sprays into both nostrils daily.   Iron Supplement 325 (65 FE) MG tablet Generic drug: ferrous sulfate Take 325 mg by mouth daily with breakfast.   prenatal multivitamin Tabs tablet Take 1 tablet by mouth daily at 12 noon.        Jorje Guild, NP 03/30/2019  5:02 PM

## 2019-04-02 ENCOUNTER — Ambulatory Visit (INDEPENDENT_AMBULATORY_CARE_PROVIDER_SITE_OTHER): Payer: Medicaid Other | Admitting: Obstetrics and Gynecology

## 2019-04-02 ENCOUNTER — Other Ambulatory Visit: Payer: Self-pay

## 2019-04-02 VITALS — BP 114/73 | HR 89 | Wt 174.0 lb

## 2019-04-02 DIAGNOSIS — O26899 Other specified pregnancy related conditions, unspecified trimester: Secondary | ICD-10-CM

## 2019-04-02 DIAGNOSIS — O0990 Supervision of high risk pregnancy, unspecified, unspecified trimester: Secondary | ICD-10-CM

## 2019-04-02 DIAGNOSIS — O0992 Supervision of high risk pregnancy, unspecified, second trimester: Secondary | ICD-10-CM

## 2019-04-02 DIAGNOSIS — G40209 Localization-related (focal) (partial) symptomatic epilepsy and epileptic syndromes with complex partial seizures, not intractable, without status epilepticus: Secondary | ICD-10-CM

## 2019-04-02 DIAGNOSIS — Z6791 Unspecified blood type, Rh negative: Secondary | ICD-10-CM

## 2019-04-02 DIAGNOSIS — O26892 Other specified pregnancy related conditions, second trimester: Secondary | ICD-10-CM

## 2019-04-02 DIAGNOSIS — D573 Sickle-cell trait: Secondary | ICD-10-CM

## 2019-04-02 DIAGNOSIS — Z3A18 18 weeks gestation of pregnancy: Secondary | ICD-10-CM

## 2019-04-02 NOTE — Patient Instructions (Signed)

## 2019-04-02 NOTE — Progress Notes (Signed)
Subjective:  Melissa [name] is a 18 y.o. G1P0 at 57w5dbeing seen today for ongoing prenatal care.  She is currently monitored for the following issues for this high-risk pregnancy and has Suicide attempt by drug ingestion ([place]; GAD (generalized anxiety disorder); Mild intellectual disability; Partial epilepsy with impairment of consciousness (HReader; Migraine without aura and without status migrainosus, not intractable; Episodic tension-type headache, not intractable; Acne vulgaris; Chronic constipation; Bilateral low back pain without sciatica; MDD (major depressive disorder), recurrent severe, without psychosis ([place]; DMDD (disruptive mood dysregulation disorder) ([place]; 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  Patient reports tired, sleepy and lightheaded at times.  Contractions: Not present. Vag. Bleeding: None.  Movement: Present. Denies leaking of fluid.   The following portions of the patient's history were reviewed and updated as appropriate: allergies, current medications, past family history, past medical history, past social history, past surgical history and problem list. Problem list updated.  Objective:   Vitals:   04/02/19 1050  BP: 114/73  Pulse: 89  Weight: 174 lb (78.9 kg)    Fetal Status: Fetal Heart Rate (bpm): 150   Movement: Present     General:  Alert, oriented and cooperative. Patient is in no acute distress.  Skin: Skin is warm and dry. No rash noted.   Cardiovascular: Normal heart rate noted  Respiratory: Normal respiratory effort, no problems with respiration noted  Abdomen: Soft, gravid, appropriate for gestational age. Pain/Pressure: Absent     Pelvic:  Cervical exam deferred        Extremities: Normal range of motion.     Mental Status: Normal mood and affect. Normal behavior. Normal judgment and thought content.    Urinalysis:      Assessment and Plan:  Pregnancy: G1P0 at 125w5d1. Supervision of high risk pregnancy, antepartum Complaints reviewed with pt and mother. Reassured. Hydration, diet and excersize reviewed and encouraged Will check additional labs today as well AFP today and reviewed Anatomy scan later this month Dental letter provided - AFP, Serum, Open Spina Bifida - Comp Met (CMET) - CBC  2. Rh negative state in antepartum period Rhogam at 28 weeks  3. Sickle cell trait ([name]) UC at 28 week labs  4. Partial epilepsy with impairment of consciousness (HCAxtellStopped meds over a yr ago. Pt to follow up with her neurology  Preterm labor symptoms and general obstetric precautions including but not limited to vaginal bleeding, contractions, leaking of fluid and fetal movement were reviewed in detail with the patient. Please refer to After Visit Summary for other counseling recommendations.  Return in about 4 weeks (around 04/30/2019) for OB visit, face to face.   ErChancy MilroyMD

## 2019-04-02 NOTE — Progress Notes (Signed)
Pt is having round ligament pain. Pt mother states she has been seen at hospital recently. Pt has been having dizziness and lightheaded when standing and/or sitting for too long.  Pt states she is also very sleepy and tired most of the time.   Pt is staying hydrated, eating well and taking PNV.

## 2019-04-04 ENCOUNTER — Other Ambulatory Visit: Payer: Self-pay

## 2019-04-04 ENCOUNTER — Ambulatory Visit (HOSPITAL_COMMUNITY)
Admission: RE | Admit: 2019-04-04 | Discharge: 2019-04-04 | Disposition: A | Discharge status: Home / Self Care | Payer: Medicaid Other | Source: Ambulatory Visit | Attending: Obstetrics and Gynecology | Admitting: Obstetrics and Gynecology

## 2019-04-04 DIAGNOSIS — O360190 Maternal care for anti-D [Rh] antibodies, unspecified trimester, not applicable or unspecified: Secondary | ICD-10-CM

## 2019-04-04 DIAGNOSIS — O0990 Supervision of high risk pregnancy, unspecified, unspecified trimester: Secondary | ICD-10-CM | POA: Insufficient documentation

## 2019-04-04 DIAGNOSIS — Z3A19 19 weeks gestation of pregnancy: Secondary | ICD-10-CM

## 2019-04-04 DIAGNOSIS — O99352 Diseases of the nervous system complicating pregnancy, second trimester: Secondary | ICD-10-CM | POA: Diagnosis not present

## 2019-04-04 DIAGNOSIS — Z363 Encounter for antenatal screening for malformations: Secondary | ICD-10-CM | POA: Diagnosis not present

## 2019-04-04 DIAGNOSIS — G40909 Epilepsy, unspecified, not intractable, without status epilepticus: Secondary | ICD-10-CM | POA: Diagnosis not present

## 2019-04-09 LAB — CBC
Hematocrit: 36.2 % (ref 34.0–46.6)
Hemoglobin: 12.3 g/dL (ref 11.1–15.9)
MCH: 29.9 pg (ref 26.6–33.0)
MCHC: 34 g/dL (ref 31.5–35.7)
MCV: 88 fL (ref 79–97)
Platelets: 172 10*3/uL (ref 150–450)
RBC: 4.12 x10E6/uL (ref 3.77–5.28)
RDW: 15.3 % (ref 11.7–15.4)
WBC: 5.8 10*3/uL (ref 3.4–10.8)

## 2019-04-09 LAB — COMPREHENSIVE METABOLIC PANEL
ALT: 17 IU/L (ref 0–32)
AST: 17 IU/L (ref 0–40)
Albumin/Globulin Ratio: 1.5 (ref 1.2–2.2)
Albumin: 3.9 g/dL (ref 3.9–5.0)
Alkaline Phosphatase: 54 IU/L (ref 39–117)
BUN/Creatinine Ratio: 7 — ABNORMAL LOW (ref 9–23)
BUN: 4 mg/dL — ABNORMAL LOW (ref 6–20)
Bilirubin Total: 0.3 mg/dL (ref 0.0–1.2)
CO2: 23 mmol/L (ref 20–29)
Calcium: 9.7 mg/dL (ref 8.7–10.2)
Chloride: 102 mmol/L (ref 96–106)
Creatinine, Ser: 0.61 mg/dL (ref 0.57–1.00)
GFR calc Af Amer: 152 mL/min/{1.73_m2} (ref >59)
GFR calc non Af Amer: 132 mL/min/{1.73_m2} (ref >59)
Globulin, Total: 2.6 g/dL (ref 1.5–4.5)
Glucose: 63 mg/dL — ABNORMAL LOW (ref 65–99)
Potassium: 3.9 mmol/L (ref 3.5–5.2)
Sodium: 138 mmol/L (ref 134–144)
Total Protein: 6.5 g/dL (ref 6.0–8.5)

## 2019-04-09 LAB — AFP, SERUM, OPEN SPINA BIFIDA
AFP MoM: 0.96
AFP Value: 45 ng/mL
Gest. Age on Collection Date: 18.7 weeks
Maternal Age At EDD: 20 yr
OSBR Risk 1 IN: 10000
Test Results:: NEGATIVE
Weight: 174 [lb_av]

## 2019-04-27 ENCOUNTER — Encounter (HOSPITAL_COMMUNITY): Payer: Self-pay | Admitting: *Deleted

## 2019-04-27 ENCOUNTER — Other Ambulatory Visit: Payer: Self-pay

## 2019-04-27 ENCOUNTER — Inpatient Hospital Stay (HOSPITAL_COMMUNITY)
Admission: AD | Admit: 2019-04-27 | Discharge: 2019-04-27 | Disposition: A | Discharge status: Home / Self Care | Payer: Medicaid Other | Attending: Obstetrics & Gynecology | Admitting: Obstetrics & Gynecology

## 2019-04-27 DIAGNOSIS — E639 Nutritional deficiency, unspecified: Secondary | ICD-10-CM | POA: Diagnosis present

## 2019-04-27 DIAGNOSIS — M549 Dorsalgia, unspecified: Secondary | ICD-10-CM | POA: Diagnosis present

## 2019-04-27 DIAGNOSIS — Z87891 Personal history of nicotine dependence: Secondary | ICD-10-CM | POA: Diagnosis not present

## 2019-04-27 DIAGNOSIS — R531 Weakness: Secondary | ICD-10-CM | POA: Diagnosis not present

## 2019-04-27 DIAGNOSIS — Z3A22 22 weeks gestation of pregnancy: Secondary | ICD-10-CM | POA: Insufficient documentation

## 2019-04-27 DIAGNOSIS — Z87820 Personal history of traumatic brain injury: Secondary | ICD-10-CM | POA: Insufficient documentation

## 2019-04-27 DIAGNOSIS — O26892 Other specified pregnancy related conditions, second trimester: Secondary | ICD-10-CM | POA: Diagnosis not present

## 2019-04-27 DIAGNOSIS — R638 Other symptoms and signs concerning food and fluid intake: Secondary | ICD-10-CM | POA: Diagnosis present

## 2019-04-27 LAB — CBC WITH DIFFERENTIAL/PLATELET
Abs Immature Granulocytes: 0.03 10*3/uL (ref 0.00–0.07)
Basophils Absolute: 0 10*3/uL (ref 0.0–0.1)
Basophils Relative: 0 %
Eosinophils Absolute: 0.1 10*3/uL (ref 0.0–0.5)
Eosinophils Relative: 2 %
HCT: 32 % — ABNORMAL LOW (ref 36.0–46.0)
Hemoglobin: 11.1 g/dL — ABNORMAL LOW (ref 12.0–15.0)
Immature Granulocytes: 1 %
Lymphocytes Relative: 26 %
Lymphs Abs: 1.7 10*3/uL (ref 0.7–4.0)
MCH: 31 pg (ref 26.0–34.0)
MCHC: 34.7 g/dL (ref 30.0–36.0)
MCV: 89.4 fL (ref 80.0–100.0)
Monocytes Absolute: 0.8 10*3/uL (ref 0.1–1.0)
Monocytes Relative: 13 %
Neutro Abs: 3.8 10*3/uL (ref 1.7–7.7)
Neutrophils Relative %: 58 %
Platelets: 170 10*3/uL (ref 150–400)
RBC: 3.58 MIL/uL — ABNORMAL LOW (ref 3.87–5.11)
RDW: 12.9 % (ref 11.5–15.5)
WBC: 6.4 10*3/uL (ref 4.0–10.5)
nRBC: 0 % (ref 0.0–0.2)

## 2019-04-27 LAB — URINALYSIS, ROUTINE W REFLEX MICROSCOPIC
Bilirubin Urine: NEGATIVE
Glucose, UA: NEGATIVE mg/dL
Hgb urine dipstick: NEGATIVE
Ketones, ur: NEGATIVE mg/dL
Nitrite: NEGATIVE
Protein, ur: NEGATIVE mg/dL
Specific Gravity, Urine: 1.002 — ABNORMAL LOW (ref 1.005–1.030)
pH: 7 (ref 5.0–8.0)

## 2019-04-27 LAB — BASIC METABOLIC PANEL
Anion gap: 9 (ref 5–15)
BUN: 7 mg/dL (ref 6–20)
CO2: 21 mmol/L — ABNORMAL LOW (ref 22–32)
Calcium: 9 mg/dL (ref 8.9–10.3)
Chloride: 104 mmol/L (ref 98–111)
Creatinine, Ser: 0.58 mg/dL (ref 0.44–1.00)
GFR calc Af Amer: >60 mL/min (ref >60)
GFR calc non Af Amer: >60 mL/min (ref >60)
Glucose, Bld: 70 mg/dL (ref 70–99)
Potassium: 3.7 mmol/L (ref 3.5–5.1)
Sodium: 134 mmol/L — ABNORMAL LOW (ref 135–145)

## 2019-04-27 NOTE — MAU Note (Signed)
Melissa [name] is a 18 y.o. at [redacted]w[redacted]d here in MAU reporting: states she just found out her SO has been unfaithful and she has been spotting and having "cottage cheese" discharge since Thursday. No spotting since Friday. States she has been checking BP at home and today it was 88/58. States she feels like something is going on with her heart. Also states she hasnt been feeling baby move as much. Having some back pain since Thursday. States she is also seeing black spots- states she is not eating well at home due to financial reasons, last decent meal was 2 days ago.    Onset of complaint: ongoing  Pain score: 8/10  Vitals:   04/27/19 1250  BP: 112/74  Pulse: 88  Resp: 18  Temp: 98.4 F (36.9 C)  SpO2: 100%     FHT: 156  Lab orders placed from triage: UA

## 2019-04-27 NOTE — Discharge Instructions (Signed)
Please make sure you are trying to eat some protein every 2-3 hours and drinking about 60 ounces of water daily.  Since your vaginal swab was not done today, you can request that it get done at your upcoming appointment.

## 2019-04-27 NOTE — MAU Provider Note (Signed)
History     CSN: [id number]  Arrival date and time: 04/27/19 1225   First Provider Initiated Contact with Patient 04/27/19 1337      Chief Complaint  Patient presents with  . Back Pain  . Vaginal Discharge  . Decreased Fetal Movement  . Hypotension   HPI  Ms.  Melissa [name] is a 18 y.o. year old G58P0 female at 45w2dweeks gestation who presents to MAU with complaints of just finding out that S.O. was unfaithful and she wants to be tested for STIs, cottage cheese vaginal d/c, low BP at home (88/58 - taken while lying down before getting up this morning), something going on with her heart, back pain since 04/24/19, seeing black spots and feeling weak, not eating very much d/t financial problems at home, and DFM. She reports the baby hasn't "moved that much". She reports she hasn't eaten a good meal in 2 days. She denies any UC's, VB or LOF. [facility]  Past Medical History:  Diagnosis Date  . Anxiety   . Asthma    inhaler. last attack June 2019  . Congenital hydronephrosis 2001  . Constipation   . Depression   . Seizures ([place]    last one in 2015 - on meds  . TBI (traumatic brain injury) ([place] 2006    Past Surgical History:  Procedure Laterality Date  . APPENDECTOMY    . HERNIA REPAIR    . INTESTINAL MALROTATION REPAIR  2001    Family History  Problem Relation Age of Onset  . Depression Mother   . Stroke Mother   . Obesity Mother   . Post-traumatic stress disorder Mother   . Anxiety disorder Mother   . Schizophrenia Father     Social History   Tobacco Use  . Smoking status: Former SResearch scientist (life sciences) . Smokeless tobacco: Never Used  . Tobacco comment: black and milds, none since + UPT  Substance Use Topics  . Alcohol use: Never    Frequency: Never  . Drug use: Not Currently    Types: Marijuana    Comment: Episodic    Allergies:  Allergies  Allergen Reactions  . Benadryl [Diphenhydramine Hcl] Other (See Comments)    Causes seizures   .  Gluten Meal Nausea And Vomiting  . Zithromax [Azithromycin] Anaphylaxis and Swelling  . Lactose Intolerance (Gi) Diarrhea    Medications Prior to Admission  Medication Sig Dispense Refill Last Dose  . albuterol (PROVENTIL) (2.5 MG/3ML) 0.083% nebulizer solution Take 3 mLs (2.5 mg total) by nebulization every 6 (six) hours as needed for wheezing or shortness of breath (IF inhaler is not strong enough). 150 mL 1   . albuterol (VENTOLIN HFA) 108 (90 Base) MCG/ACT inhaler Inhale 2 puffs into the lungs every 6 (six) hours as needed for wheezing or shortness of breath (shortness of breath from reactive airway disease). 6.7 g 1   . Blood Pressure Monitoring KIT 1 kit by Does not apply route once a week. 1 kit 0   . cyclobenzaprine (FLEXERIL) 5 MG tablet Take 1 tablet (5 mg total) by mouth 3 (three) times daily as needed (headache (take with tylenol)). 20 tablet 0   . ferrous sulfate (IRON SUPPLEMENT) 325 (65 FE) MG tablet Take 325 mg by mouth daily with breakfast.     . fluticasone (FLONASE) 50 MCG/ACT nasal spray Place 2 sprays into both nostrils daily. 16 g 11   . Prenatal Vit-Fe Fumarate-FA (PRENATAL MULTIVITAMIN) TABS tablet Take 1  tablet by mouth daily at 12 noon.       Review of Systems  Constitutional: Negative.   HENT: Negative.   Eyes: Negative.   Respiratory: Negative.   Cardiovascular: Negative.   Gastrointestinal: Negative.   Endocrine: Negative.   Genitourinary: Positive for vaginal discharge ("like cottage cheese").  Musculoskeletal: Positive for back pain.  Skin: Negative.   Allergic/Immunologic: Negative.   Neurological: Positive for weakness (when standing) and light-headedness (when standing).  Hematological: Negative.   Psychiatric/Behavioral: Negative.    Physical Exam   Blood pressure 116/79, pulse 87, temperature 98.4 F (36.9 C), temperature source Oral, resp. rate 18, height _0  (1.702 m), weight 82 kg, last menstrual period 11/22/2018, SpO2 100 %.  Physical  Exam  Nursing note and vitals reviewed. Constitutional: She is oriented to person, place, and time. She appears well-developed.  HENT:  Head: Normocephalic and atraumatic.  Eyes: Pupils are equal, round, and reactive to light.  Neck: Normal range of motion.  Cardiovascular: Normal rate and regular rhythm.  Respiratory: Effort normal.  GI: Soft. Bowel sounds are decreased.  Genitourinary:    Genitourinary Comments: Uterus: gravid, S=D, SE: cervix is smooth, pink, no lesions, moderate amt of thick, white vaginal d/c -- WP, GC/CT done, closed/long/firm, no CMT or friability, no adnexal tenderness    Musculoskeletal: Normal range of motion.  Neurological: She is alert and oriented to person, place, and time. She has normal reflexes.  Skin: Skin is warm and dry.  Psychiatric: She has a normal mood and affect. Her behavior is normal. Judgment and thought content normal.    FHTs by doppler: 156 bpm  MAU Course  Procedures  MDM [facility] Wet Prep & GC/CT -- not done d/t tube not labelled and specimen getting lost in tube station then rejected by the lab because of the time of the specimen.  Offered pt to have the swab recollected a 3rd time -- declined because she is ready to go home. => orders d/c'd HIV -- pending RPR CBC w/Diff BMP Regular Diet  Results for orders placed or performed during the hospital encounter of 04/27/19 (from the past 24 hour(s))  Urinalysis, Routine w reflex microscopic     Status: Abnormal   Collection Time: 04/27/19  1:13 PM  Result Value Ref Range   Color, Urine STRAW (A) YELLOW   APPearance HAZY (A) CLEAR   Specific Gravity, Urine 1.002 (L) 1.005 - 1.030   pH 7.0 5.0 - 8.0   Glucose, UA NEGATIVE NEGATIVE mg/dL   Hgb urine dipstick NEGATIVE NEGATIVE   Bilirubin Urine NEGATIVE NEGATIVE   Ketones, ur NEGATIVE NEGATIVE mg/dL   Protein, ur NEGATIVE NEGATIVE mg/dL   Nitrite NEGATIVE NEGATIVE   Leukocytes,Ua TRACE (A) NEGATIVE   RBC / HPF 0-5 0 - 5 RBC/hpf    WBC, UA 6-10 0 - 5 WBC/hpf   Bacteria, UA RARE (A) NONE SEEN   Squamous Epithelial / LPF 21-50 0 - 5  CBC with Differential/Platelet     Status: Abnormal   Collection Time: 04/27/19  1:56 PM  Result Value Ref Range   WBC 6.4 4.0 - 10.5 K/uL   RBC 3.58 (L) 3.87 - 5.11 MIL/uL   Hemoglobin 11.1 (L) 12.0 - 15.0 g/dL   HCT 32.0 (L) 36.0 - 46.0 %   MCV 89.4 80.0 - 100.0 fL   MCH 31.0 26.0 - 34.0 pg   MCHC 34.7 30.0 - 36.0 g/dL   RDW 12.9 11.5 - 15.5 %   Platelets 170 150 -  400 K/uL   nRBC 0.0 0.0 - 0.2 %   Neutrophils Relative % 58 %   Neutro Abs 3.8 1.7 - 7.7 K/uL   Lymphocytes Relative 26 %   Lymphs Abs 1.7 0.7 - 4.0 K/uL   Monocytes Relative 13 %   Monocytes Absolute 0.8 0.1 - 1.0 K/uL   Eosinophils Relative 2 %   Eosinophils Absolute 0.1 0.0 - 0.5 K/uL   Basophils Relative 0 %   Basophils Absolute 0.0 0.0 - 0.1 K/uL   Immature Granulocytes 1 %   Abs Immature Granulocytes 0.03 0.00 - 0.07 K/uL  Basic metabolic panel     Status: Abnormal   Collection Time: 04/27/19  1:56 PM  Result Value Ref Range   Sodium 134 (L) 135 - 145 mmol/L   Potassium 3.7 3.5 - 5.1 mmol/L   Chloride 104 98 - 111 mmol/L   CO2 21 (L) 22 - 32 mmol/L   Glucose, Bld 70 70 - 99 mg/dL   BUN 7 6 - 20 mg/dL   Creatinine, Ser 0.58 0.44 - 1.00 mg/dL   Calcium 9.0 8.9 - 10.3 mg/dL   GFR calc non Af Amer >60 >60 mL/min   GFR calc Af Amer >60 >60 mL/min   Anion gap 9 5 - 15     Assessment and Plan  Inadequate oral nutritional intake - Plan: Discharge patient - Information provided on eating plan for pregnancy - Advised to try to attempt to eat protein rich meal at least every 2-3 hours - Advised to drink at least 60 ounces of water daily - Advised to have wet prep done at next OB visit - Keep scheduled appt with [facility] on 04/30/2019 - Patient verbalized an understanding of the plan of care and agrees.   Laury Deep, MSN, CNM 04/27/2019, 1:44 PM

## 2019-04-27 NOTE — MAU Note (Signed)
This RN attempted to discharge pt, pt was not in the room. NT states pt left the unit prior to receiving paperwork.

## 2019-04-28 LAB — RPR: RPR Ser Ql: NONREACTIVE

## 2019-04-29 LAB — HIV ANTIBODY (ROUTINE TESTING W REFLEX): HIV Screen 4th Generation wRfx: NONREACTIVE

## 2019-04-30 ENCOUNTER — Other Ambulatory Visit: Payer: Self-pay

## 2019-04-30 ENCOUNTER — Ambulatory Visit (INDEPENDENT_AMBULATORY_CARE_PROVIDER_SITE_OTHER): Payer: Medicaid Other | Admitting: Obstetrics and Gynecology

## 2019-04-30 ENCOUNTER — Encounter: Payer: Self-pay | Admitting: Obstetrics and Gynecology

## 2019-04-30 ENCOUNTER — Other Ambulatory Visit (HOSPITAL_COMMUNITY)
Admission: RE | Admit: 2019-04-30 | Discharge: 2019-04-30 | Disposition: A | Discharge status: Home / Self Care | Payer: Medicaid Other | Source: Ambulatory Visit | Attending: Obstetrics and Gynecology | Admitting: Obstetrics and Gynecology

## 2019-04-30 VITALS — BP 108/71 | HR 83 | Wt 182.1 lb

## 2019-04-30 DIAGNOSIS — O26892 Other specified pregnancy related conditions, second trimester: Secondary | ICD-10-CM

## 2019-04-30 DIAGNOSIS — O26899 Other specified pregnancy related conditions, unspecified trimester: Secondary | ICD-10-CM

## 2019-04-30 DIAGNOSIS — O99012 Anemia complicating pregnancy, second trimester: Secondary | ICD-10-CM

## 2019-04-30 DIAGNOSIS — O0992 Supervision of high risk pregnancy, unspecified, second trimester: Secondary | ICD-10-CM

## 2019-04-30 DIAGNOSIS — Z3A22 22 weeks gestation of pregnancy: Secondary | ICD-10-CM

## 2019-04-30 DIAGNOSIS — Z6791 Unspecified blood type, Rh negative: Secondary | ICD-10-CM

## 2019-04-30 DIAGNOSIS — G40209 Localization-related (focal) (partial) symptomatic epilepsy and epileptic syndromes with complex partial seizures, not intractable, without status epilepticus: Secondary | ICD-10-CM

## 2019-04-30 DIAGNOSIS — O0990 Supervision of high risk pregnancy, unspecified, unspecified trimester: Secondary | ICD-10-CM

## 2019-04-30 DIAGNOSIS — D573 Sickle-cell trait: Secondary | ICD-10-CM

## 2019-04-30 LAB — GC/CHLAMYDIA PROBE AMP (~~LOC~~) NOT AT ARMC
Chlamydia: NEGATIVE
Neisseria Gonorrhea: NEGATIVE

## 2019-04-30 NOTE — Progress Notes (Signed)
   PRENATAL VISIT NOTE  Subjective:  Melissa [name] is a 18 y.o. G1P0 at [redacted]w[redacted]d being seen today for ongoing prenatal care.  She is currently monitored for the following issues for this high-risk pregnancy and has Suicide attempt by drug ingestion ([place]); GAD (generalized anxiety disorder); Mild intellectual disability; Partial epilepsy with impairment of consciousness ([place]); Migraine without aura and without status migrainosus, not intractable; Episodic tension-type headache, not intractable; Acne vulgaris; Chronic constipation; Bilateral low back pain without sciatica; MDD (major depressive disorder), recurrent severe, without psychosis ([place]); DMDD (disruptive mood dysregulation disorder) ([place]); 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  Patient reports vaginal irritation.  Contractions: Not present. Vag. Bleeding: None.  Movement: Present. Denies leaking of fluid.   The following portions of the patient's history were reviewed and updated as appropriate: allergies, current medications, past family history, past medical history, past social history, past surgical history and problem list.   Objective:   Vitals:   04/30/19 1101  BP: 108/71  Pulse: 83  Weight: 182 lb 1.3 oz (82.6 kg)    Fetal Status: Fetal Heart Rate (bpm): 154   Movement: Present     General:  Alert, oriented and cooperative. Patient is in no acute distress.  Skin: Skin is warm and dry. No rash noted.   Cardiovascular: Normal heart rate noted  Respiratory: Normal respiratory effort, no problems with respiration noted  Abdomen: Soft, gravid, appropriate for gestational age.  Pain/Pressure: Present     Pelvic: Cervical exam deferred        Extremities: Normal range of motion.  Edema: None  Mental Status: Normal mood and affect. Normal behavior.  Normal judgment and thought content.   Assessment and Plan:  Pregnancy: G1P0 at [redacted]w[redacted]d 1. Supervision of high risk pregnancy, antepartum Patient is doing well Vaginal cultures collected today Third trimester labs today  2. Sickle cell trait ([place]) Urine culture at next visit  3. Rh negative state in antepartum period Rhogam at next visit  4. Partial epilepsy with impairment of consciousness [facility]) Patient is planning to schedule an appointment with neurologist No seizure since discontinuation of medication at the age of 6  Preterm labor symptoms and general obstetric precautions including but not limited to vaginal bleeding, contractions, leaking of fluid and fetal movement were reviewed in detail with the patient. Please refer to After Visit Summary for other counseling recommendations.   Return in about 4 weeks (around 05/28/2019) for in person, ROB, 2 hr glucola next visit, Low risk.  No future appointments.  Mora Bellman, MD

## 2019-04-30 NOTE — Progress Notes (Signed)
Pt states that she has been having some vaginal discharge and itching x 2 weeks.  Lecil Tapp l Salisa Broz, CMA

## 2019-05-02 LAB — CERVICOVAGINAL ANCILLARY ONLY
Bacterial vaginitis: NEGATIVE
Candida vaginitis: POSITIVE — AB
Chlamydia: NEGATIVE
Neisseria Gonorrhea: NEGATIVE
Trichomonas: NEGATIVE

## 2019-05-05 MED ORDER — FLUCONAZOLE 150 MG PO TABS: 150.0000 mg | ORAL_TABLET | Freq: Once | ORAL | 0 refills | Status: AC

## 2019-05-05 NOTE — Addendum Note (Signed)
Addended by: Mora Bellman on: 05/05/2019 09:03 AM   Modules accepted: Orders

## 2019-05-07 ENCOUNTER — Ambulatory Visit (HOSPITAL_COMMUNITY): Payer: Medicaid Other | Admitting: *Deleted

## 2019-05-07 ENCOUNTER — Ambulatory Visit (HOSPITAL_COMMUNITY)
Admission: RE | Admit: 2019-05-07 | Discharge: 2019-05-07 | Disposition: A | Discharge status: Home / Self Care | Payer: Medicaid Other | Source: Ambulatory Visit | Attending: Obstetrics and Gynecology | Admitting: Obstetrics and Gynecology

## 2019-05-07 ENCOUNTER — Encounter (HOSPITAL_COMMUNITY): Payer: Self-pay

## 2019-05-07 ENCOUNTER — Other Ambulatory Visit: Payer: Self-pay

## 2019-05-07 DIAGNOSIS — D573 Sickle-cell trait: Secondary | ICD-10-CM | POA: Diagnosis present

## 2019-05-07 DIAGNOSIS — Z362 Encounter for other antenatal screening follow-up: Secondary | ICD-10-CM

## 2019-05-07 DIAGNOSIS — O0990 Supervision of high risk pregnancy, unspecified, unspecified trimester: Secondary | ICD-10-CM

## 2019-05-07 DIAGNOSIS — R638 Other symptoms and signs concerning food and fluid intake: Secondary | ICD-10-CM | POA: Insufficient documentation

## 2019-05-07 DIAGNOSIS — O99352 Diseases of the nervous system complicating pregnancy, second trimester: Secondary | ICD-10-CM | POA: Diagnosis not present

## 2019-05-07 DIAGNOSIS — G40909 Epilepsy, unspecified, not intractable, without status epilepticus: Secondary | ICD-10-CM | POA: Diagnosis not present

## 2019-05-07 DIAGNOSIS — O360120 Maternal care for anti-D [Rh] antibodies, second trimester, not applicable or unspecified: Secondary | ICD-10-CM

## 2019-05-07 DIAGNOSIS — Z3A23 23 weeks gestation of pregnancy: Secondary | ICD-10-CM

## 2019-05-07 DIAGNOSIS — E639 Nutritional deficiency, unspecified: Secondary | ICD-10-CM

## 2019-05-28 ENCOUNTER — Ambulatory Visit (INDEPENDENT_AMBULATORY_CARE_PROVIDER_SITE_OTHER): Payer: Medicaid Other | Admitting: Certified Nurse Midwife

## 2019-05-28 ENCOUNTER — Other Ambulatory Visit: Payer: Medicaid Other

## 2019-05-28 ENCOUNTER — Encounter: Payer: Self-pay | Admitting: Certified Nurse Midwife

## 2019-05-28 ENCOUNTER — Other Ambulatory Visit: Payer: Self-pay

## 2019-05-28 VITALS — BP 106/70 | HR 93 | Wt 187.0 lb

## 2019-05-28 DIAGNOSIS — O26892 Other specified pregnancy related conditions, second trimester: Secondary | ICD-10-CM

## 2019-05-28 DIAGNOSIS — Z6791 Unspecified blood type, Rh negative: Secondary | ICD-10-CM

## 2019-05-28 DIAGNOSIS — O0992 Supervision of high risk pregnancy, unspecified, second trimester: Secondary | ICD-10-CM

## 2019-05-28 DIAGNOSIS — Z3A26 26 weeks gestation of pregnancy: Secondary | ICD-10-CM

## 2019-05-28 DIAGNOSIS — G40209 Localization-related (focal) (partial) symptomatic epilepsy and epileptic syndromes with complex partial seizures, not intractable, without status epilepticus: Secondary | ICD-10-CM

## 2019-05-28 DIAGNOSIS — R519 Headache, unspecified: Secondary | ICD-10-CM

## 2019-05-28 DIAGNOSIS — O0990 Supervision of high risk pregnancy, unspecified, unspecified trimester: Secondary | ICD-10-CM

## 2019-05-28 DIAGNOSIS — K5909 Other constipation: Secondary | ICD-10-CM

## 2019-05-28 DIAGNOSIS — O26893 Other specified pregnancy related conditions, third trimester: Secondary | ICD-10-CM

## 2019-05-28 DIAGNOSIS — G40201 Localization-related (focal) (partial) symptomatic epilepsy and epileptic syndromes with complex partial seizures, not intractable, with status epilepticus: Secondary | ICD-10-CM

## 2019-05-28 DIAGNOSIS — Z23 Encounter for immunization: Secondary | ICD-10-CM

## 2019-05-28 MED ORDER — DOCUSATE SODIUM 100 MG PO CAPS: 100.0000 mg | ORAL_CAPSULE | Freq: Two times a day (BID) | ORAL | 1 refills | Status: DC | PRN

## 2019-05-28 MED ORDER — FAMOTIDINE 20 MG PO TABS: 20.0000 mg | ORAL_TABLET | Freq: Two times a day (BID) | ORAL | 1 refills | Status: DC | PRN

## 2019-05-28 NOTE — Progress Notes (Signed)
error 

## 2019-05-28 NOTE — Patient Instructions (Signed)

## 2019-05-28 NOTE — Progress Notes (Signed)
Patient reports fetal movement with occasional pressure. 

## 2019-05-28 NOTE — Progress Notes (Signed)
PRENATAL VISIT NOTE  Subjective:  Melissa [name] is a 18 y.o. G1P0 at 76w5dbeing seen today for ongoing prenatal care.  She is currently monitored for the following issues for this high-risk pregnancy and has Suicide attempt by drug ingestion ([place]; GAD (generalized anxiety disorder); Mild intellectual disability; Partial epilepsy with impairment of consciousness ([place]; Migraine without aura and without status migrainosus, not intractable; Episodic tension-type headache, not intractable; Acne vulgaris; Chronic constipation; Bilateral low back pain without sciatica; MDD (major depressive disorder), recurrent severe, without psychosis ([place]; DMDD (disruptive mood dysregulation disorder) ([place]; 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  Patient reports constipation, heartburn and occasional headaches.  Contractions: Not present. Vag. Bleeding: None.  Movement: Present. Denies leaking of fluid.   The following portions of the patient's history were reviewed and updated as appropriate: allergies, current medications, past family history, past medical history, past social history, past surgical history and problem list.   Objective:   Vitals:   05/28/19 0823  BP: 106/70  Pulse: 93  Weight: 187 lb (84.8 kg)    Fetal Status: Fetal Heart Rate (bpm): 145 Fundal Height: 24 cm Movement: Present     General:  Alert, oriented and cooperative. Patient is in no acute distress.  Skin: Skin is warm and dry. No rash noted.   Cardiovascular: Normal heart rate noted  Respiratory: Normal respiratory effort, no problems with respiration noted  Abdomen: Soft, gravid, appropriate for gestational age.  Pain/Pressure: Present     Pelvic: Cervical exam deferred        Extremities: Normal range of motion.  Edema: None  Mental  Status: Normal mood and affect. Normal behavior. Normal judgment and thought content.   Assessment and Plan:  Pregnancy: G1P0 at [redacted]w[redacted]d. Supervision of high risk pregnancy, antepartum - Patient reports constipation, heartburn and occasional headaches  - Reports heartburn is worsening in the later stages of pregnancy, currently taking tums but not working- educated and discussed use of pepcid for heartburn  - Anticipatory guidance on upcoming appointments  - Glucose Tolerance, 2 Hours w/1 Hour - RPR - CBC - HIV antibody (with reflex) - Tdap vaccine greater than or equal to 7yo IM - Flu Vaccine QUAD 36+ mos IM (Fluarix, Quad PF) - famotidine (PEPCID) 20 MG tablet; Take 1 tablet (20 mg total) by mouth 2 (two) times daily as needed for heartburn or indigestion.  Dispense: 30 tablet; Refill: 1  2. Rh negative state in antepartum period - Rhogam to be given around 28 weeks at next appointment   3. Partial epilepsy with impairment of consciousness ([place]- No medication, denies having a seizure since 2015  4. Chronic constipation - Educated and discussed use of colace for chronic constipation, increase amount of water consumption and increase amount of daily fiber she consumes  - docusate sodium (COLACE) 100 MG capsule; Take 1 capsule (100 mg total) by mouth 2 (two) times daily as needed.  Dispense: 30 capsule; Refill: 1  5. Headache in pregnancy, antepartum, third trimester - Patient reports left sided HA every couple of days for the past 2-3 weeks, she reports taking tylenol with "some relief" of HA  - Discussed use of [id number] Tylenol for HA, if that does not work then can be prescribed different medication such as Flexeril  - Patient reports occasional dizziness when HA occurs and  reports it occurring prepregnancy as well, most likely migraines but will check for PEC  - No abnormal BP noted, educated and discussed s/s of PEC  - Protein / creatinine ratio, urine - Comp Met (CMET)   Preterm labor symptoms and general obstetric precautions including but not limited to vaginal bleeding, contractions, leaking of fluid and fetal movement were reviewed in detail with the patient. Please refer to After Visit Summary for other counseling recommendations.   Return in about 2 weeks (around 06/11/2019) for ROB/Rhogam injection.  Future Appointments  Date Time Provider [place]  06/11/2019  2:45 PM Woodroe Mode, MD [place] None    Lajean Manes, CNM

## 2019-05-29 ENCOUNTER — Other Ambulatory Visit: Payer: Self-pay

## 2019-05-29 ENCOUNTER — Encounter (HOSPITAL_COMMUNITY): Payer: Self-pay | Admitting: Emergency Medicine

## 2019-05-29 ENCOUNTER — Inpatient Hospital Stay (HOSPITAL_COMMUNITY)
Admission: AD | Admit: 2019-05-29 | Discharge: 2019-05-29 | Disposition: A | Discharge status: Home / Self Care | Payer: Medicaid Other | Attending: Obstetrics & Gynecology | Admitting: Obstetrics & Gynecology

## 2019-05-29 DIAGNOSIS — R102 Pelvic and perineal pain: Secondary | ICD-10-CM | POA: Diagnosis not present

## 2019-05-29 DIAGNOSIS — J45909 Unspecified asthma, uncomplicated: Secondary | ICD-10-CM | POA: Insufficient documentation

## 2019-05-29 DIAGNOSIS — O99891 Other specified diseases and conditions complicating pregnancy: Secondary | ICD-10-CM | POA: Diagnosis not present

## 2019-05-29 DIAGNOSIS — Z87891 Personal history of nicotine dependence: Secondary | ICD-10-CM | POA: Diagnosis not present

## 2019-05-29 DIAGNOSIS — Z569 Unspecified problems related to employment: Secondary | ICD-10-CM | POA: Insufficient documentation

## 2019-05-29 DIAGNOSIS — R519 Headache, unspecified: Secondary | ICD-10-CM | POA: Diagnosis not present

## 2019-05-29 DIAGNOSIS — Z3A26 26 weeks gestation of pregnancy: Secondary | ICD-10-CM | POA: Diagnosis not present

## 2019-05-29 DIAGNOSIS — O99512 Diseases of the respiratory system complicating pregnancy, second trimester: Secondary | ICD-10-CM | POA: Insufficient documentation

## 2019-05-29 DIAGNOSIS — O26892 Other specified pregnancy related conditions, second trimester: Secondary | ICD-10-CM | POA: Diagnosis not present

## 2019-05-29 DIAGNOSIS — Z659 Problem related to unspecified psychosocial circumstances: Secondary | ICD-10-CM

## 2019-05-29 DIAGNOSIS — Z87820 Personal history of traumatic brain injury: Secondary | ICD-10-CM | POA: Diagnosis not present

## 2019-05-29 DIAGNOSIS — R109 Unspecified abdominal pain: Secondary | ICD-10-CM | POA: Diagnosis not present

## 2019-05-29 DIAGNOSIS — M545 Low back pain: Secondary | ICD-10-CM | POA: Insufficient documentation

## 2019-05-29 DIAGNOSIS — O26899 Other specified pregnancy related conditions, unspecified trimester: Secondary | ICD-10-CM

## 2019-05-29 DIAGNOSIS — Z79899 Other long term (current) drug therapy: Secondary | ICD-10-CM | POA: Diagnosis not present

## 2019-05-29 DIAGNOSIS — M549 Dorsalgia, unspecified: Secondary | ICD-10-CM | POA: Diagnosis not present

## 2019-05-29 LAB — URINALYSIS, ROUTINE W REFLEX MICROSCOPIC
Bilirubin Urine: NEGATIVE
Glucose, UA: NEGATIVE mg/dL
Hgb urine dipstick: NEGATIVE
Ketones, ur: NEGATIVE mg/dL
Leukocytes,Ua: NEGATIVE
Nitrite: NEGATIVE
Protein, ur: NEGATIVE mg/dL
Specific Gravity, Urine: 1.005 (ref 1.005–1.030)
pH: 7 (ref 5.0–8.0)

## 2019-05-29 LAB — COMPREHENSIVE METABOLIC PANEL
ALT: 15 IU/L (ref 0–32)
AST: 15 IU/L (ref 0–40)
Albumin/Globulin Ratio: 1.6 (ref 1.2–2.2)
Albumin: 3.6 g/dL — ABNORMAL LOW (ref 3.9–5.0)
Alkaline Phosphatase: 66 IU/L (ref 39–117)
BUN/Creatinine Ratio: 9 (ref 9–23)
BUN: 6 mg/dL (ref 6–20)
Bilirubin Total: 0.3 mg/dL (ref 0.0–1.2)
CO2: 22 mmol/L (ref 20–29)
Calcium: 8.8 mg/dL (ref 8.7–10.2)
Chloride: 106 mmol/L (ref 96–106)
Creatinine, Ser: 0.68 mg/dL (ref 0.57–1.00)
GFR calc Af Amer: 147 mL/min/{1.73_m2} (ref >59)
GFR calc non Af Amer: 127 mL/min/{1.73_m2} (ref >59)
Globulin, Total: 2.2 g/dL (ref 1.5–4.5)
Glucose: 81 mg/dL (ref 65–99)
Potassium: 3.9 mmol/L (ref 3.5–5.2)
Sodium: 139 mmol/L (ref 134–144)
Total Protein: 5.8 g/dL — ABNORMAL LOW (ref 6.0–8.5)

## 2019-05-29 LAB — GLUCOSE TOLERANCE, 2 HOURS W/ 1HR
Glucose, 1 hour: 78 mg/dL (ref 65–179)
Glucose, 2 hour: 85 mg/dL (ref 65–152)
Glucose, Fasting: 78 mg/dL (ref 65–91)

## 2019-05-29 LAB — RPR: RPR Ser Ql: NONREACTIVE

## 2019-05-29 LAB — RAPID URINE DRUG SCREEN, HOSP PERFORMED
Amphetamines: NOT DETECTED
Barbiturates: NOT DETECTED
Benzodiazepines: NOT DETECTED
Cocaine: NOT DETECTED
Opiates: NOT DETECTED
Tetrahydrocannabinol: NOT DETECTED

## 2019-05-29 LAB — CBC
Hematocrit: 34.1 % (ref 34.0–46.6)
Hemoglobin: 11.4 g/dL (ref 11.1–15.9)
MCH: 30.8 pg (ref 26.6–33.0)
MCHC: 33.4 g/dL (ref 31.5–35.7)
MCV: 92 fL (ref 79–97)
Platelets: 172 10*3/uL (ref 150–450)
RBC: 3.7 x10E6/uL — ABNORMAL LOW (ref 3.77–5.28)
RDW: 13.1 % (ref 11.7–15.4)
WBC: 6.7 10*3/uL (ref 3.4–10.8)

## 2019-05-29 LAB — HIV ANTIBODY (ROUTINE TESTING W REFLEX): HIV Screen 4th Generation wRfx: NONREACTIVE

## 2019-05-29 LAB — PROTEIN / CREATININE RATIO, URINE
Creatinine, Urine: 66.8 mg/dL
Protein, Ur: 8.7 mg/dL
Protein/Creat Ratio: 130 mg/g creat (ref 0–200)

## 2019-05-29 MED ORDER — CYCLOBENZAPRINE HCL 10 MG PO TABS
5.0000 mg | ORAL_TABLET | Freq: Once | ORAL | Status: DC
  Filled 2019-05-29: qty 1

## 2019-05-29 NOTE — MAU Note (Signed)
Pt reports to MAU stating she carried a mattress upstairs around 2200 and after she did that and had a argument with her mom she started having clear discharge and back and belly pain. Pt reports the pain comes and goes every 5-10 min. Pt reports +FM.

## 2019-05-29 NOTE — Discharge Instructions (Signed)
Back Injury Prevention Back injuries can be very painful. They can also be difficult to heal. After having one back injury, you are more likely to have another one again. It is important to learn how to avoid injuring or re-injuring your back. The following tips can help you to prevent a back injury. What actions can I take to prevent back injuries? Changes in your diet Talk with your doctor about what to eat. Some foods can make the bones strong.  Talk with your doctor about how much calcium and vitamin D you need each day. These nutrients help to prevent weakening of the bones (osteoporosis).  Eat foods that have calcium. These include: ? Dairy products. ? Green leafy vegetables. ? Food and drinks that have had calcium added to them (fortified).  Eat foods that have vitamin D. These include: ? Milk. ? Food and drinks that have had vitamin D added to them.  Take other supplements and vitamins only as told by your doctor. Physical fitness Physical fitness makes your bones and muscles strong. It also improves your balance and strength.  Exercise for 30 minutes per day on most days of the week, or as told by your doctor. Make sure to: ? Do aerobic exercises, such as walking, jogging, biking, or swimming. ? Do exercises that increase balance and strength, such as tai chi and yoga. ? Do stretching exercises. This helps with flexibility. ? Develop strong belly (abdominal) muscles. Your belly muscles help to support your back.  Stay at a healthy weight. This lowers your risk of a back injury. Good posture        Prevent back injuries by developing and maintaining a good posture. To do this:  Sit up straight and stand up straight. Avoid leaning forward when you sit or hunching over when you stand.  Choose chairs that have good low-back (lumbar) support.  If you work at a desk: ? Sit close to it so you do not need to lean over. ? Keep your chin tucked in. ? Keep your neck drawn  back. ? Keep your elbows bent so that your arms make a corner (right angle).  When you drive: ? Sit high and close to the steering wheel. Add a low-back support to your car seat, if needed. ? Take breaks every hour if you are driving for long periods of time.  Avoid sitting or standing in one position for very long. Take breaks to get up, stretch, and walk around at least once every hour.  Sleep on your side with your knees slightly bent, or sleep on your back with a pillow under your knees.  Lifting, twisting, and reaching   Heavy lifting ? Avoid heavy lifting, especially lifting over and over again. If you must do heavy lifting: ? Stretch before lifting. ? Work slowly. ? Rest between lifts. ? Use a tool such as a cart or a dolly to move objects if one is available. ? Make several small trips instead of carrying one heavy load. ? Ask for help when you need it, especially when moving big objects. ? Follow these steps when lifting: ? Stand with your feet shoulder-width apart. ? Get as close to the object as you can. Do not pick up a heavy object that is far from your body. ? Use handles or lifting straps if they are available. ? Bend at your knees. Squat down, but keep your heels off the floor. ? Keep your shoulders back. Keep your chin tucked in. Keep  your back straight. ? Lift the object slowly while you tighten the muscles in your legs, belly, and bottom. Keep the object as close to the center of your body as possible. ? Follow these steps when putting down a heavy load: ? Stand with your feet shoulder-width apart. ? Lower the object slowly while you tighten the muscles in your legs, belly, and bottom. Keep the object as close to the center of your body as possible. ? Keep your shoulders back. Keep your chin tucked in. Keep your back straight. ? Bend at your knees. Squat down, but keep your heels off the floor. ? Use handles or lifting straps if they are available.  Twisting and  reaching ? Avoid lifting heavy objects above your waist. ? Do not twist at your waist while you are lifting or carrying a load. If you need to turn, move your feet. ? Do not bend over without bending at your knees. ? Avoid reaching over your head, across a table, or for an object on a high surface. Other things to do   Avoid wet floors and icy ground. Keep sidewalks clear of ice to prevent falls.  Do not sleep on a mattress that is too soft or too hard.  Store heavier objects on shelves at waist level.  Store lighter objects on lower or higher shelves.  Find ways to lower your stress, such as: ? Exercise. ? Massage. ? Relaxation techniques.  Talk with your doctor if you feel anxious or depressed. These conditions can make back pain worse.  Wear flat heel shoes with cushioned soles.  Use both shoulder straps when carrying a backpack.  Do not use any products that contain nicotine or tobacco, such as cigarettes and e-cigarettes. If you need help quitting, ask your doctor. Summary  Back injuries can be very painful and difficult to heal.  You can keep your back healthy by making certain changes. These include eating foods that make bones strong, working on being physically fit, developing a good posture, and lifting heavy objects in a safe way. This information is not intended to replace advice given to you by your health care provider. Make sure you discuss any questions you have with your health care provider. Document Released: 01/24/2008 Document Revised: 09/28/2017 Document Reviewed: 09/28/2017 Elsevier Patient Education  [place].   Abdominal Pain During Pregnancy  Abdominal pain is common during pregnancy, and has many possible causes. Some causes are more serious than others, and sometimes the cause is not known. Abdominal pain can be a sign that labor is starting. It can also be caused by normal growth and stretching of muscles and ligaments during pregnancy.  Always tell your health care provider if you have any abdominal pain. Follow these instructions at home:  Do not have sex or put anything in your vagina until your pain goes away completely.  Get plenty of rest until your pain improves.  Drink enough fluid to keep your urine pale yellow.  Take over-the-counter and prescription medicines only as told by your health care provider.  Keep all follow-up visits as told by your health care provider. This is important. Contact a health care provider if:  Your pain continues or gets worse after resting.  You have lower abdominal pain that: ? Comes and goes at regular intervals. ? Spreads to your back. ? Is similar to menstrual cramps.  You have pain or burning when you urinate. Get help right away if:  You have a fever or  chills.  You have vaginal bleeding.  You are leaking fluid from your vagina.  You are passing tissue from your vagina.  You have vomiting or diarrhea that lasts for more than 24 hours.  Your baby is moving less than usual.  You feel very weak or faint.  You have shortness of breath.  You develop severe pain in your upper abdomen. Summary  Abdominal pain is common during pregnancy, and has many possible causes.  If you experience abdominal pain during pregnancy, tell your health care provider right away.  Follow your health care provider's home care instructions and keep all follow-up visits as directed. This information is not intended to replace advice given to you by your health care provider. Make sure you discuss any questions you have with your health care provider. Document Released: 08/07/2005 Document Revised: 11/25/2018 Document Reviewed: 11/09/2016 Elsevier Patient Education  2020 [employer].

## 2019-05-29 NOTE — MAU Note (Addendum)
Pc to [employer] (Education officer, museum) @ [phone]. Left message on voicemail to make her aware of social work consult ordered for pt. See consult order for further details.     Gilmer Mor RN
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
## 2019-05-29 NOTE — ED Notes (Signed)
Report to [name] in MAU.  Transporter called to take pt to MAU.
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
## 2019-05-29 NOTE — MAU Provider Note (Signed)
Chief Complaint:  Back Pain   First Provider Initiated Contact with Patient 05/29/19 0124      HPI: Melissa [name] is a 18 y.o. G1P0 at 60w6dho presents to maternity admissions reporting low back pain and pelvic cramping after carrying a bed up the stairs.  But she states she thinks the back pain is solely "from stressing" and not from lifting.  ALso has a headache.  Reports lots of stress in family home. She reports good fetal movement, denies LOF, vaginal bleeding, vaginal itching/burning, urinary symptoms,  dizziness, n/v, diarrhea, constipation or fever/chills.   RN note: Pt reports to MAU stating she carried a mattress upstairs around 2200 and after she did that and had a argument with her mom she started having clear discharge and back and belly pain. Pt reports the pain comes and goes every 5-10 min. Pt reports +FM.   Past Medical History: Past Medical History:  Diagnosis Date  . Anxiety   . Asthma    inhaler. last attack June 2019  . Congenital hydronephrosis 2001  . Constipation   . Depression   . Seizures ([place]    last one in 2015 - on meds  . TBI (traumatic brain injury) ([place] 2006    Past obstetric history: OB History  Gravida Para Term Preterm AB Living  1         0  SAB TAB Ectopic Multiple Live Births               # Outcome Date GA Lbr Len/2nd Weight Sex Delivery Anes PTL Lv  1 Current             Past Surgical History: Past Surgical History:  Procedure Laterality Date  . APPENDECTOMY    . HERNIA REPAIR    . INTESTINAL MALROTATION REPAIR  2001    Family History: Family History  Problem Relation Age of Onset  . Depression Mother   . Stroke Mother   . Obesity Mother   . Post-traumatic stress disorder Mother   . Anxiety disorder Mother   . Schizophrenia Father     Social History: Social History   Tobacco Use  . Smoking status: Former SResearch scientist (life sciences) . Smokeless tobacco: Never Used  . Tobacco comment: black and milds, none since + UPT   Substance Use Topics  . Alcohol use: Never    Frequency: Never  . Drug use: Not Currently    Types: Marijuana    Comment: Episodic    Allergies:  Allergies  Allergen Reactions  . Benadryl [Diphenhydramine Hcl] Other (See Comments)    Causes seizures   . Gluten Meal Nausea And Vomiting  . Zithromax [Azithromycin] Anaphylaxis and Swelling  . Lactose Intolerance (Gi) Diarrhea    Meds:  Medications Prior to Admission  Medication Sig Dispense Refill Last Dose  . albuterol (PROVENTIL) (2.5 MG/3ML) 0.083% nebulizer solution Take 3 mLs (2.5 mg total) by nebulization every 6 (six) hours as needed for wheezing or shortness of breath (IF inhaler is not strong enough). 150 mL 1 Past Week at Unknown time  . albuterol (VENTOLIN HFA) 108 (90 Base) MCG/ACT inhaler Inhale 2 puffs into the lungs every 6 (six) hours as needed for wheezing or shortness of breath (shortness of breath from reactive airway disease). 6.7 g 1 Past Week at Unknown time  . docusate sodium (COLACE) 100 MG capsule Take 1 capsule (100 mg total) by mouth 2 (two) times daily as needed. 30 capsule 1 05/28/2019 at Unknown time  .  famotidine (PEPCID) 20 MG tablet Take 1 tablet (20 mg total) by mouth 2 (two) times daily as needed for heartburn or indigestion. 30 tablet 1 05/28/2019 at Unknown time  . ferrous sulfate (IRON SUPPLEMENT) 325 (65 FE) MG tablet Take 325 mg by mouth daily with breakfast.   05/28/2019 at Unknown time  . Prenatal Vit-Fe Fumarate-FA (PRENATAL MULTIVITAMIN) TABS tablet Take 1 tablet by mouth daily at 12 noon.   05/28/2019 at Unknown time  . Blood Pressure Monitoring KIT 1 kit by Does not apply route once a week. 1 kit 0 Unknown at Unknown time  . cyclobenzaprine (FLEXERIL) 5 MG tablet Take 1 tablet (5 mg total) by mouth 3 (three) times daily as needed (headache (take with tylenol)). (Patient not taking: Reported on 04/30/2019) 20 tablet 0   . fluticasone (FLONASE) 50 MCG/ACT nasal spray Place 2 sprays into both nostrils  daily. 16 g 11 Unknown at Unknown time    I have reviewed patient's Past Medical Hx, Surgical Hx, Family Hx, Social Hx, medications and allergies.   ROS:  Review of Systems  Constitutional: Negative for chills and fever.  Respiratory: Negative for shortness of breath.   Gastrointestinal: Negative for constipation, diarrhea and nausea.  Genitourinary: Positive for pelvic pain. Negative for vaginal bleeding.  Musculoskeletal: Positive for back pain.   Other systems negative  Physical Exam   Patient Vitals for the past 24 hrs:  BP Temp Temp src Pulse Resp SpO2  05/29/19 0034 110/69 98.5 F (36.9 C) Oral 94 16 100 %   Constitutional: Well-developed, well-nourished female in no acute distress.  Cardiovascular: normal rate and rhythm Respiratory: normal effort, clear to auscultation bilaterally GI: Abd soft, non-tender, gravid appropriate for gestational age.   No rebound or guarding. MS: Extremities nontender, no edema, normal ROM  Some tenderness over lumbar area Neurologic: Alert and oriented x 4.  GU: Neg CVAT.  PELVIC EXAM:  cervix long and closed  FHT:  Baseline 145 , moderate variability, small accelerations present, no decelerations Contractions:  Rare   Labs: Results for orders placed or performed during the hospital encounter of 05/29/19 (from the past 24 hour(s))  Urinalysis, Routine w reflex microscopic     Status: Abnormal   Collection Time: 05/29/19  1:49 AM  Result Value Ref Range   Color, Urine STRAW (A) YELLOW   APPearance CLEAR CLEAR   Specific Gravity, Urine 1.005 1.005 - 1.030   pH 7.0 5.0 - 8.0   Glucose, UA NEGATIVE NEGATIVE mg/dL   Hgb urine dipstick NEGATIVE NEGATIVE   Bilirubin Urine NEGATIVE NEGATIVE   Ketones, ur NEGATIVE NEGATIVE mg/dL   Protein, ur NEGATIVE NEGATIVE mg/dL   Nitrite NEGATIVE NEGATIVE   Leukocytes,Ua NEGATIVE NEGATIVE  Urine rapid drug screen (hosp performed)     Status: None   Collection Time: 05/29/19  1:49 AM  Result Value  Ref Range   Opiates NONE DETECTED NONE DETECTED   Cocaine NONE DETECTED NONE DETECTED   Benzodiazepines NONE DETECTED NONE DETECTED   Amphetamines NONE DETECTED NONE DETECTED   Tetrahydrocannabinol NONE DETECTED NONE DETECTED   Barbiturates NONE DETECTED NONE DETECTED    O/Negative/-- (06/17 1354)  Imaging:    MAU Course/MDM: I have ordered labs and reviewed results. These are negative.  RN felt that patient was very somnolent as time went on so we did the drug screen.   NST reviewed, reassuring Consult Chaplain for help with social stress.  She recommends followup with social worker Treatments in MAU included Flexeril which  was refused by patient.  Pain totally resolved on its own. .    Assessment: Single iUP at 33w6dLow back pain post lifting mattress Pelvic cramping, resolved Headache, resolved Social stress  Plan: Discharge home Reduced lifting Preterm Labor precautions and fetal kick counts Follow up in Office for prenatal visits and recheck of symptoms consult social work Encouraged to return here or to other Urgent Care/ED if she develops worsening of symptoms, increase in pain, fever, or other concerning symptoms.   Pt stable at time of discharge.  MHansel FeinsteinCNM, MSN Certified Nurse-Midwife 05/29/2019 1:24 AM

## 2019-06-11 ENCOUNTER — Ambulatory Visit (INDEPENDENT_AMBULATORY_CARE_PROVIDER_SITE_OTHER): Payer: Medicaid Other | Admitting: Obstetrics & Gynecology

## 2019-06-11 ENCOUNTER — Other Ambulatory Visit: Payer: Self-pay

## 2019-06-11 DIAGNOSIS — O360130 Maternal care for anti-D [Rh] antibodies, third trimester, not applicable or unspecified: Secondary | ICD-10-CM | POA: Diagnosis not present

## 2019-06-11 DIAGNOSIS — O0993 Supervision of high risk pregnancy, unspecified, third trimester: Secondary | ICD-10-CM

## 2019-06-11 DIAGNOSIS — Z3A28 28 weeks gestation of pregnancy: Secondary | ICD-10-CM | POA: Diagnosis not present

## 2019-06-11 DIAGNOSIS — O0990 Supervision of high risk pregnancy, unspecified, unspecified trimester: Secondary | ICD-10-CM

## 2019-06-11 MED ORDER — RHO D IMMUNE GLOBULIN 1500 UNIT/2ML IJ SOSY
300.0000 ug | PREFILLED_SYRINGE | Freq: Once | INTRAMUSCULAR | Status: AC
  Administered 2019-06-11: 300 ug via INTRAMUSCULAR

## 2019-06-11 MED ORDER — FLUCONAZOLE 150 MG PO TABS: 150.0000 mg | ORAL_TABLET | Freq: Once | ORAL | 0 refills | Status: AC

## 2019-06-11 NOTE — Progress Notes (Signed)
   PRENATAL VISIT NOTE  Subjective:  Melissa [name] is a 18 y.o. G1P0 at [redacted]w[redacted]d being seen today for ongoing prenatal care.  She is currently monitored for the following issues for this high-risk pregnancy and has Suicide attempt by drug ingestion ([place]); GAD (generalized anxiety disorder); Mild intellectual disability; Partial epilepsy with impairment of consciousness ([place]); Migraine without aura and without status migrainosus, not intractable; Episodic tension-type headache, not intractable; Acne vulgaris; Chronic constipation; Bilateral low back pain without sciatica; MDD (major depressive disorder), recurrent severe, without psychosis ([place]); DMDD (disruptive mood dysregulation disorder) ([place]); 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  Patient reports vaginal irritation.  Contractions: Not present.  .  Movement: Present. Denies leaking of fluid.   The following portions of the patient's history were reviewed and updated as appropriate: allergies, current medications, past family history, past medical history, past social history, past surgical history and problem list.   Objective:   Vitals:   06/11/19 1438  BP: 106/69  Pulse: 94  Weight: 192 lb (87.1 kg)    Fetal Status: Fetal Heart Rate (bpm): 145 Fundal Height: 29 cm Movement: Present     General:  Alert, oriented and cooperative. Patient is in no acute distress.  Skin: Skin is warm and dry. No rash noted.   Cardiovascular: Normal heart rate noted  Respiratory: Normal respiratory effort, no problems with respiration noted  Abdomen: Soft, gravid, appropriate for gestational age.  Pain/Pressure: Present     Pelvic: Cervical exam deferred        Extremities: Normal range of motion.  Edema: Trace  Mental Status: Normal mood and affect. Normal behavior.  Normal judgment and thought content.   Assessment and Plan:  Pregnancy: G1P0 at [redacted]w[redacted]d 1. Supervision of high risk pregnancy, antepartum Requests refill - fluconazole (DIFLUCAN) 150 MG tablet; Take 1 tablet (150 mg total) by mouth once for 1 dose. Repeat dose in 3 days  Dispense: 2 tablet; Refill: 0  Preterm labor symptoms and general obstetric precautions including but not limited to vaginal bleeding, contractions, leaking of fluid and fetal movement were reviewed in detail with the patient. Please refer to After Visit Summary for other counseling recommendations.   Return in about 2 weeks (around 06/25/2019).  Future Appointments  Date Time Provider [place]  06/25/2019  4:00 PM Sloan Leiter, MD [facility] None    Emeterio Reeve, MD

## 2019-06-11 NOTE — Patient Instructions (Signed)

## 2019-06-11 NOTE — Progress Notes (Addendum)
ROB. Rhogam given in [place], tolerated well.

## 2019-06-18 ENCOUNTER — Inpatient Hospital Stay (HOSPITAL_COMMUNITY)
Admission: AD | Admit: 2019-06-18 | Discharge: 2019-06-18 | Disposition: A | Discharge status: Home / Self Care | Payer: Medicaid Other | Attending: Family Medicine | Admitting: Family Medicine

## 2019-06-18 ENCOUNTER — Other Ambulatory Visit: Payer: Self-pay

## 2019-06-18 ENCOUNTER — Encounter (HOSPITAL_COMMUNITY): Payer: Self-pay | Admitting: *Deleted

## 2019-06-18 DIAGNOSIS — Z881 Allergy status to other antibiotic agents status: Secondary | ICD-10-CM | POA: Insufficient documentation

## 2019-06-18 DIAGNOSIS — O26893 Other specified pregnancy related conditions, third trimester: Secondary | ICD-10-CM | POA: Diagnosis not present

## 2019-06-18 DIAGNOSIS — Z79899 Other long term (current) drug therapy: Secondary | ICD-10-CM | POA: Diagnosis not present

## 2019-06-18 DIAGNOSIS — R519 Headache, unspecified: Secondary | ICD-10-CM | POA: Diagnosis not present

## 2019-06-18 DIAGNOSIS — Z888 Allergy status to other drugs, medicaments and biological substances status: Secondary | ICD-10-CM | POA: Diagnosis not present

## 2019-06-18 DIAGNOSIS — Z3A29 29 weeks gestation of pregnancy: Secondary | ICD-10-CM | POA: Insufficient documentation

## 2019-06-18 DIAGNOSIS — Z87891 Personal history of nicotine dependence: Secondary | ICD-10-CM | POA: Insufficient documentation

## 2019-06-18 DIAGNOSIS — Z87820 Personal history of traumatic brain injury: Secondary | ICD-10-CM | POA: Diagnosis not present

## 2019-06-18 DIAGNOSIS — M542 Cervicalgia: Secondary | ICD-10-CM | POA: Insufficient documentation

## 2019-06-18 LAB — COMPREHENSIVE METABOLIC PANEL
ALT: 14 U/L (ref 0–44)
AST: 14 U/L — ABNORMAL LOW (ref 15–41)
Albumin: 2.7 g/dL — ABNORMAL LOW (ref 3.5–5.0)
Alkaline Phosphatase: 68 U/L (ref 38–126)
Anion gap: 7 (ref 5–15)
BUN: 6 mg/dL (ref 6–20)
CO2: 22 mmol/L (ref 22–32)
Calcium: 8.7 mg/dL — ABNORMAL LOW (ref 8.9–10.3)
Chloride: 103 mmol/L (ref 98–111)
Creatinine, Ser: 0.58 mg/dL (ref 0.44–1.00)
GFR calc Af Amer: >60 mL/min (ref >60)
GFR calc non Af Amer: >60 mL/min (ref >60)
Glucose, Bld: 90 mg/dL (ref 70–99)
Potassium: 3.6 mmol/L (ref 3.5–5.1)
Sodium: 132 mmol/L — ABNORMAL LOW (ref 135–145)
Total Bilirubin: 0.1 mg/dL — ABNORMAL LOW (ref 0.3–1.2)
Total Protein: 5.8 g/dL — ABNORMAL LOW (ref 6.5–8.1)

## 2019-06-18 LAB — CBC
HCT: 32.1 % — ABNORMAL LOW (ref 36.0–46.0)
Hemoglobin: 10.9 g/dL — ABNORMAL LOW (ref 12.0–15.0)
MCH: 30.3 pg (ref 26.0–34.0)
MCHC: 34 g/dL (ref 30.0–36.0)
MCV: 89.2 fL (ref 80.0–100.0)
Platelets: 162 10*3/uL (ref 150–400)
RBC: 3.6 MIL/uL — ABNORMAL LOW (ref 3.87–5.11)
RDW: 12.3 % (ref 11.5–15.5)
WBC: 6.8 10*3/uL (ref 4.0–10.5)
nRBC: 0 % (ref 0.0–0.2)

## 2019-06-18 LAB — PROTEIN / CREATININE RATIO, URINE
Creatinine, Urine: 14.38 mg/dL
Total Protein, Urine: <6 mg/dL

## 2019-06-18 MED ORDER — ACETAMINOPHEN 500 MG PO TABS
1000.0000 mg | ORAL_TABLET | Freq: Once | ORAL | Status: AC
  Administered 2019-06-18: 1000 mg via ORAL
  Filled 2019-06-18: qty 2

## 2019-06-18 MED ORDER — CYCLOBENZAPRINE HCL 5 MG PO TABS
7.5000 mg | ORAL_TABLET | Freq: Once | ORAL | Status: AC
  Administered 2019-06-18: 7.5 mg via ORAL
  Filled 2019-06-18: qty 1.5

## 2019-06-18 MED ORDER — CYCLOBENZAPRINE HCL 5 MG PO TABS: 5.0000 mg | ORAL_TABLET | Freq: Three times a day (TID) | ORAL | 0 refills | Status: DC | PRN

## 2019-06-18 NOTE — MAU Provider Note (Signed)
Chief Complaint:  Headache, Hypertension, and Neck Pain   First Provider Initiated Contact with Patient 06/18/19 2028      HPI: Melissa [name] is a 18 y.o. G1P0 at 41w5dby who presents to maternity admissions reporting headache and blurred vision along with high BP. She was brought in by EMS. Prior to calling EMS she reports SBP in 140's and also in 140's in ambulance. Headache present for last 3 days. She does have a history of migraines and reports a headache with spots in her vision a few weeks ago. She has not yet taken anything for pain. Some nausea without vomiting. She feels she is tolerating PO well. She has been seeing spots in her visions. Denies abdominal pain, SOB, chest pain. Reports bilateral neck pain over past couple of days. Headache worsened by lights. She has also not been wearing her eye glasses. Headache rated 7/10. She reports good fetal movement, denies LOF, vaginal bleeding, vaginal itching/burning, urinary symptoms, vomiting, or fever/chills.    Past Medical History: Past Medical History:  Diagnosis Date  . Anxiety   . Asthma    inhaler. last attack June 2019  . Congenital hydronephrosis 2001  . Constipation   . Depression   . Seizures ([place]    last one in 2015 - on meds  . TBI (traumatic brain injury) ([facility] 2006    Past obstetric history: OB History  Gravida Para Term Preterm AB Living  1         0  SAB TAB Ectopic Multiple Live Births               # Outcome Date GA Lbr Len/2nd Weight Sex Delivery Anes PTL Lv  1 Current             Past Surgical History: Past Surgical History:  Procedure Laterality Date  . APPENDECTOMY    . HERNIA REPAIR    . INTESTINAL MALROTATION REPAIR  2001    Family History: Family History  Problem Relation Age of Onset  . Depression Mother   . Stroke Mother   . Obesity Mother   . Post-traumatic stress disorder Mother   . Anxiety disorder Mother   . Schizophrenia Father     Social History: Social History    Tobacco Use  . Smoking status: Former SResearch scientist (life sciences) . Smokeless tobacco: Never Used  . Tobacco comment: black and milds, none since + UPT  Substance Use Topics  . Alcohol use: Never    Frequency: Never  . Drug use: Not Currently    Types: Marijuana    Comment: Episodic    Allergies:  Allergies  Allergen Reactions  . Benadryl [Diphenhydramine Hcl] Other (See Comments)    Causes seizures   . Gluten Meal Nausea And Vomiting  . Zithromax [Azithromycin] Anaphylaxis and Swelling  . Lactose Intolerance (Gi) Diarrhea    Meds:  Medications Prior to Admission  Medication Sig Dispense Refill Last Dose  . albuterol (VENTOLIN HFA) 108 (90 Base) MCG/ACT inhaler Inhale 2 puffs into the lungs every 6 (six) hours as needed for wheezing or shortness of breath (shortness of breath from reactive airway disease). 6.7 g 1 06/17/2019 at Unknown time  . docusate sodium (COLACE) 100 MG capsule Take 1 capsule (100 mg total) by mouth 2 (two) times daily as needed. 30 capsule 1 Past Week at Unknown time  . famotidine (PEPCID) 20 MG tablet Take 1 tablet (20 mg total) by mouth 2 (two) times daily as needed for heartburn  or indigestion. 30 tablet 1 Past Week at Unknown time  . ferrous sulfate (IRON SUPPLEMENT) 325 (65 FE) MG tablet Take 325 mg by mouth daily with breakfast.   Past Week at Unknown time  . fluticasone (FLONASE) 50 MCG/ACT nasal spray Place 2 sprays into both nostrils daily. 16 g 11 Past Month at Unknown time  . Prenatal Vit-Fe Fumarate-FA (PRENATAL MULTIVITAMIN) TABS tablet Take 1 tablet by mouth daily at 12 noon.   06/17/2019 at Unknown time  . albuterol (PROVENTIL) (2.5 MG/3ML) 0.083% nebulizer solution Take 3 mLs (2.5 mg total) by nebulization every 6 (six) hours as needed for wheezing or shortness of breath (IF inhaler is not strong enough). 150 mL 1   . Blood Pressure Monitoring KIT 1 kit by Does not apply route once a week. 1 kit 0   . [DISCONTINUED] cyclobenzaprine (FLEXERIL) 5 MG tablet Take  1 tablet (5 mg total) by mouth 3 (three) times daily as needed (headache (take with tylenol)). (Patient not taking: Reported on 04/30/2019) 20 tablet 0     ROS:  Review of Systems All other systems negative unless noted above in HPI.   I have reviewed patient's Past Medical Hx, Surgical Hx, Family Hx, Social Hx, medications and allergies.   Physical Exam   Patient Vitals for the past 24 hrs:  BP Temp Temp src Pulse Resp SpO2  06/18/19 2203 107/72 - - 80 - -  06/18/19 2120 110/66 - - 78 - -  06/18/19 2100 109/72 - - 80 - -  06/18/19 2040 112/71 - - 78 - -  06/18/19 2021 111/74 - - 84 - -  06/18/19 2010 113/72 - - 85 - -  06/18/19 2006 110/70 - - 87 - -  06/18/19 2005 109/70 - - 81 - -  06/18/19 1931 114/74 (!) 97.4 F (36.3 C) Oral 90 20 100 %  06/18/19 1930 114/74 - - 91 - -   Constitutional: Well-developed, well-nourished female in no acute distress.  Cardiovascular: normal rate Respiratory: normal effort GI: Abd soft, non-tender, gravid appropriate for gestational age.  MS: Extremities nontender, no edema, normal ROM Neurologic: Alert and oriented x 4.  Ext: No LE edema appreciated. Psych: Normal mood and affect.     FHT:  Baseline 140 , moderate variability, accelerations present, no decelerations Contractions: None   Labs: Results for orders placed or performed during the hospital encounter of 06/18/19 (from the past 24 hour(s))  Protein / creatinine ratio, urine     Status: None   Collection Time: 06/18/19  8:11 PM  Result Value Ref Range   Creatinine, Urine 14.38 mg/dL   Total Protein, Urine <6 mg/dL   Protein Creatinine Ratio        0.00 - 0.15 mg/mg[Cre]  Comprehensive metabolic panel     Status: Abnormal   Collection Time: 06/18/19  8:25 PM  Result Value Ref Range   Sodium 132 (L) 135 - 145 mmol/L   Potassium 3.6 3.5 - 5.1 mmol/L   Chloride 103 98 - 111 mmol/L   CO2 22 22 - 32 mmol/L   Glucose, Bld 90 70 - 99 mg/dL   BUN 6 6 - 20 mg/dL   Creatinine, Ser  0.58 0.44 - 1.00 mg/dL   Calcium 8.7 (L) 8.9 - 10.3 mg/dL   Total Protein 5.8 (L) 6.5 - 8.1 g/dL   Albumin 2.7 (L) 3.5 - 5.0 g/dL   AST 14 (L) 15 - 41 U/L   ALT 14 0 - 44 U/L  Alkaline Phosphatase 68 38 - 126 U/L   Total Bilirubin 0.1 (L) 0.3 - 1.2 mg/dL   GFR calc non Af Amer >60 >60 mL/min   GFR calc Af Amer >60 >60 mL/min   Anion gap 7 5 - 15  CBC     Status: Abnormal   Collection Time: 06/18/19  8:25 PM  Result Value Ref Range   WBC 6.8 4.0 - 10.5 K/uL   RBC 3.60 (L) 3.87 - 5.11 MIL/uL   Hemoglobin 10.9 (L) 12.0 - 15.0 g/dL   HCT 32.1 (L) 36.0 - 46.0 %   MCV 89.2 80.0 - 100.0 fL   MCH 30.3 26.0 - 34.0 pg   MCHC 34.0 30.0 - 36.0 g/dL   RDW 12.3 11.5 - 15.5 %   Platelets 162 150 - 400 K/uL   nRBC 0.0 0.0 - 0.2 %   O/Negative/-- (06/17 1354)  Imaging:  No results found.  MAU Course/MDM: Orders Placed This Encounter  Procedures  . Comprehensive metabolic panel  . CBC  . Protein / creatinine ratio, urine  . Discharge patient Discharge disposition: 01-Home or Self Care; Discharge patient date: 06/18/2019    Meds ordered this encounter  Medications  . acetaminophen (TYLENOL) tablet 1,000 mg  . cyclobenzaprine (FLEXERIL) tablet 7.5 mg  . cyclobenzaprine (FLEXERIL) 5 MG tablet    Sig: Take 1 tablet (5 mg total) by mouth 3 (three) times daily as needed (headache (take with tylenol)).    Dispense:  20 tablet    Refill:  0     NST reviewed: Appropriate for gestational age CBC, CMP and Pr/Cr WNL. BP's WNL. No evidence of Pre-E. Headache likely related to migraine hx with symptoms of phonophobia and photophobia.  Treatments in MAU included Tylenol and Flexeril which improved symptoms to 3-4/10 and patient felt comfortable discharging home. Flexeril prescribed on discharge to take as needed for neck pain or headaches. Patient verbalized understanding of discharge and follow-up instructions and was ambulating without assistance upon discharge. Pt discharge with strict return  precautions.  Assessment: 1. Acute nonintractable headache, unspecified headache type     Plan: Discharge home Labor precautions and fetal kick counts  Allergies as of 06/18/2019      Reactions   Benadryl [diphenhydramine Hcl] Other (See Comments)   Causes seizures   Gluten Meal Nausea And Vomiting   Zithromax [azithromycin] Anaphylaxis, Swelling   Lactose Intolerance (gi) Diarrhea      Medication List    TAKE these medications   albuterol (2.5 MG/3ML) 0.083% nebulizer solution Commonly known as: PROVENTIL Take 3 mLs (2.5 mg total) by nebulization every 6 (six) hours as needed for wheezing or shortness of breath (IF inhaler is not strong enough).   albuterol 108 (90 Base) MCG/ACT inhaler Commonly known as: VENTOLIN HFA Inhale 2 puffs into the lungs every 6 (six) hours as needed for wheezing or shortness of breath (shortness of breath from reactive airway disease).   Blood Pressure Monitoring Kit 1 kit by Does not apply route once a week.   cyclobenzaprine 5 MG tablet Commonly known as: FLEXERIL Take 1 tablet (5 mg total) by mouth 3 (three) times daily as needed (headache (take with tylenol)).   docusate sodium 100 MG capsule Commonly known as: COLACE Take 1 capsule (100 mg total) by mouth 2 (two) times daily as needed.   famotidine 20 MG tablet Commonly known as: Pepcid Take 1 tablet (20 mg total) by mouth 2 (two) times daily as needed for heartburn or indigestion.  fluticasone 50 MCG/ACT nasal spray Commonly known as: FLONASE Place 2 sprays into both nostrils daily.   Iron Supplement 325 (65 FE) MG tablet Generic drug: ferrous sulfate Take 325 mg by mouth daily with breakfast.   prenatal multivitamin Tabs tablet Take 1 tablet by mouth daily at 12 noon.       Barrington Ellison, MD [facility] Fellow, [facility] for [facility] 06/18/2019 10:13 PM

## 2019-06-18 NOTE — MAU Note (Addendum)
Have had h/a since becoming pregnant. For last wk when I look up my neck hurts and then if I look down my vision is blurry. When I urinate or have BM my neck hurts on both sides. Symptoms worse today so that is why I came in. Denies vag d/c or bleeding. Baby moving well. My B/P has been up and down this pregnancy. Pt came by EMS and walked in with EMS personnal.

## 2019-06-25 ENCOUNTER — Other Ambulatory Visit: Payer: Self-pay

## 2019-06-25 ENCOUNTER — Encounter: Payer: Self-pay | Admitting: Obstetrics and Gynecology

## 2019-06-25 ENCOUNTER — Ambulatory Visit (INDEPENDENT_AMBULATORY_CARE_PROVIDER_SITE_OTHER): Payer: Medicaid Other | Admitting: Obstetrics and Gynecology

## 2019-06-25 VITALS — BP 105/68 | HR 91 | Wt 193.0 lb

## 2019-06-25 DIAGNOSIS — O0990 Supervision of high risk pregnancy, unspecified, unspecified trimester: Secondary | ICD-10-CM

## 2019-06-25 DIAGNOSIS — O0993 Supervision of high risk pregnancy, unspecified, third trimester: Secondary | ICD-10-CM

## 2019-06-25 DIAGNOSIS — Z3A30 30 weeks gestation of pregnancy: Secondary | ICD-10-CM

## 2019-06-25 DIAGNOSIS — O26893 Other specified pregnancy related conditions, third trimester: Secondary | ICD-10-CM

## 2019-06-25 DIAGNOSIS — R569 Unspecified convulsions: Secondary | ICD-10-CM

## 2019-06-25 DIAGNOSIS — O99013 Anemia complicating pregnancy, third trimester: Secondary | ICD-10-CM

## 2019-06-25 DIAGNOSIS — O360130 Maternal care for anti-D [Rh] antibodies, third trimester, not applicable or unspecified: Secondary | ICD-10-CM

## 2019-06-25 DIAGNOSIS — Z6791 Unspecified blood type, Rh negative: Secondary | ICD-10-CM

## 2019-06-25 DIAGNOSIS — D573 Sickle-cell trait: Secondary | ICD-10-CM
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
## 2019-07-09 ENCOUNTER — Encounter: Payer: Medicaid Other | Admitting: Family Medicine

## 2019-07-11 ENCOUNTER — Ambulatory Visit (INDEPENDENT_AMBULATORY_CARE_PROVIDER_SITE_OTHER): Payer: Medicaid Other | Admitting: Obstetrics and Gynecology

## 2019-07-11 ENCOUNTER — Encounter: Payer: Self-pay | Admitting: Obstetrics and Gynecology

## 2019-07-11 DIAGNOSIS — Z3A33 33 weeks gestation of pregnancy: Secondary | ICD-10-CM

## 2019-07-11 DIAGNOSIS — D573 Sickle-cell trait: Secondary | ICD-10-CM

## 2019-07-11 DIAGNOSIS — O0993 Supervision of high risk pregnancy, unspecified, third trimester: Secondary | ICD-10-CM

## 2019-07-11 DIAGNOSIS — O26893 Other specified pregnancy related conditions, third trimester: Secondary | ICD-10-CM

## 2019-07-11 DIAGNOSIS — O0990 Supervision of high risk pregnancy, unspecified, unspecified trimester: Secondary | ICD-10-CM

## 2019-07-11 DIAGNOSIS — R569 Unspecified convulsions: Secondary | ICD-10-CM

## 2019-07-11 DIAGNOSIS — Z6791 Unspecified blood type, Rh negative: Secondary | ICD-10-CM

## 2019-07-11 NOTE — Progress Notes (Signed)
   TELEHEALTH VIRTUAL OBSTETRICS VISIT ENCOUNTER NOTE  I connected with [name] on 07/11/19 at 10:15 AM EST by telephone at home and verified that I am speaking with the correct person using two identifiers.   I discussed the limitations, risks, security and privacy concerns of performing an evaluation and management service by telephone and the availability of in person appointments. I also discussed with the patient that there may be a patient responsible charge related to this service. The patient expressed understanding and agreed to proceed.  Subjective:  Melissa [name] is a 18 y.o. G1P0 at [redacted]w[redacted]d being followed for ongoing prenatal care.  She is currently monitored for the following issues for this high-risk pregnancy and has Suicide attempt by drug ingestion ([place]); GAD (generalized anxiety disorder); Mild intellectual disability; Partial epilepsy with impairment of consciousness ([place]); Migraine without aura and without status migrainosus, not intractable; Episodic tension-type headache, not intractable; Acne vulgaris; Chronic constipation; Bilateral low back pain without sciatica; MDD (major depressive disorder), recurrent severe, without psychosis ([place]); DMDD (disruptive mood dysregulation disorder) ([place]); 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  Patient reports no complaints. Reports fetal movement. Denies any contractions, bleeding or leaking of fluid.   The following portions of the patient's history were reviewed and updated as appropriate: allergies, current medications, past family history, past medical history, past social history, past surgical history and problem list.   Objective:   General:  Alert, oriented and cooperative.   Mental Status: Normal mood and affect  perceived. Normal judgment and thought content.  Rest of physical exam deferred due to type of encounter  Assessment and Plan:  Pregnancy: G1P0 at [redacted]w[redacted]d 1. Supervision of high risk pregnancy, antepartum Stable GBS at next visit  2. Rh negative state in antepartum period S/P [place]  3. Seizures ([place]) Stable Has not followed up with neurology, pt encouraged to do so  4. Sickle cell trait ([name]) Stable  Preterm labor symptoms and general obstetric precautions including but not limited to vaginal bleeding, contractions, leaking of fluid and fetal movement were reviewed in detail with the patient.  I discussed the assessment and treatment plan with the patient. The patient was provided an opportunity to ask questions and all were answered. The patient agreed with the plan and demonstrated an understanding of the instructions. The patient was advised to call back or seek an in-person office evaluation/go to MAU at [facility] for any urgent or concerning symptoms. Please refer to After Visit Summary for other counseling recommendations.   I provided 8 minutes of non-face-to-face time during this encounter.  Return in about 3 weeks (around 08/01/2019) for OB visit, face to face for GBS.  No future appointments.  Chancy Milroy, MD [facility] [place], [place]

## 2019-07-24 ENCOUNTER — Other Ambulatory Visit: Payer: Self-pay

## 2019-07-24 ENCOUNTER — Encounter (HOSPITAL_COMMUNITY): Payer: Self-pay

## 2019-07-24 ENCOUNTER — Inpatient Hospital Stay (HOSPITAL_COMMUNITY)
Admission: AD | Admit: 2019-07-24 | Discharge: 2019-07-24 | Disposition: A | Discharge status: Home / Self Care | Payer: Medicaid Other | Attending: Obstetrics & Gynecology | Admitting: Obstetrics & Gynecology

## 2019-07-24 DIAGNOSIS — O99891 Other specified diseases and conditions complicating pregnancy: Secondary | ICD-10-CM

## 2019-07-24 DIAGNOSIS — E639 Nutritional deficiency, unspecified: Secondary | ICD-10-CM

## 2019-07-24 DIAGNOSIS — R638 Other symptoms and signs concerning food and fluid intake: Secondary | ICD-10-CM

## 2019-07-24 DIAGNOSIS — M549 Dorsalgia, unspecified: Secondary | ICD-10-CM | POA: Insufficient documentation

## 2019-07-24 DIAGNOSIS — D573 Sickle-cell trait: Secondary | ICD-10-CM

## 2019-07-24 DIAGNOSIS — R1030 Lower abdominal pain, unspecified: Secondary | ICD-10-CM | POA: Insufficient documentation

## 2019-07-24 DIAGNOSIS — O4703 False labor before 37 completed weeks of gestation, third trimester: Secondary | ICD-10-CM | POA: Diagnosis not present

## 2019-07-24 DIAGNOSIS — N898 Other specified noninflammatory disorders of vagina: Secondary | ICD-10-CM | POA: Diagnosis not present

## 2019-07-24 DIAGNOSIS — Z3689 Encounter for other specified antenatal screening: Secondary | ICD-10-CM

## 2019-07-24 DIAGNOSIS — R109 Unspecified abdominal pain: Secondary | ICD-10-CM | POA: Diagnosis not present

## 2019-07-24 DIAGNOSIS — Z3A34 34 weeks gestation of pregnancy: Secondary | ICD-10-CM | POA: Diagnosis not present

## 2019-07-24 DIAGNOSIS — O26893 Other specified pregnancy related conditions, third trimester: Secondary | ICD-10-CM | POA: Insufficient documentation

## 2019-07-24 DIAGNOSIS — Z87891 Personal history of nicotine dependence: Secondary | ICD-10-CM | POA: Diagnosis not present

## 2019-07-24 LAB — URINALYSIS, ROUTINE W REFLEX MICROSCOPIC
Bilirubin Urine: NEGATIVE
Glucose, UA: NEGATIVE mg/dL
Hgb urine dipstick: NEGATIVE
Ketones, ur: NEGATIVE mg/dL
Leukocytes,Ua: NEGATIVE
Nitrite: NEGATIVE
Protein, ur: NEGATIVE mg/dL
Specific Gravity, Urine: 1.008 (ref 1.005–1.030)
pH: 6 (ref 5.0–8.0)

## 2019-07-24 LAB — WET PREP, GENITAL
Clue Cells Wet Prep HPF POC: NONE SEEN
Sperm: NONE SEEN
Trich, Wet Prep: NONE SEEN
WBC, Wet Prep HPF POC: NONE SEEN
Yeast Wet Prep HPF POC: NONE SEEN

## 2019-07-24 MED ORDER — ACETAMINOPHEN 500 MG PO TABS
1000.0000 mg | ORAL_TABLET | Freq: Once | ORAL | Status: AC
  Administered 2019-07-24: 1000 mg via ORAL
  Filled 2019-07-24: qty 2

## 2019-07-24 MED ORDER — CYCLOBENZAPRINE HCL 10 MG PO TABS
10.0000 mg | ORAL_TABLET | Freq: Once | ORAL | Status: AC
  Administered 2019-07-24: 22:00:00 10 mg via ORAL
  Filled 2019-07-24: qty 1

## 2019-07-24 MED ORDER — BETAMETHASONE SOD PHOS & ACET 6 (3-3) MG/ML IJ SUSP
12.0000 mg | Freq: Once | INTRAMUSCULAR | Status: AC
  Administered 2019-07-24: 12 mg via INTRAMUSCULAR
  Filled 2019-07-24: qty 5

## 2019-07-24 NOTE — MAU Note (Signed)
Having ctxs for few days but been able to deal with it. Ctxs stronger today and having lower back pain. Denies vag bleeding. White/clear d/c has increased

## 2019-07-24 NOTE — Progress Notes (Signed)
Provider aware

## 2019-07-24 NOTE — MAU Provider Note (Signed)
History     CSN: [id number]  Arrival date and time: 07/24/19 2026   First Provider Initiated Contact with Patient 07/24/19 2117      Chief Complaint  Patient presents with  . Back Pain   Melissa [name] is a 18 y.o. G1P0 at 50w6dwho receives care at [facility].  She presents today for Back Pain, Contractions, and Abdominal Pain.  She reports the back pain has been present for a week and is intermittent in nature.  She reports that she tries to lay down and gets relief, but starts having pain with movement.  She reports she has been having sharp lower abdominal pain that started Monday and has been intermittent.  She states it has gotten worse today and she has noticed that her stomach is getting hard.  She denies aggravating or relieving factors for her pain and states she has taken tylenol with no relief.  She denies sexual activity in the last 3 days. She reports drinking about a half a gallon of water today.  She endorses fetal movement, contractions, and vaginal discharge, but denies vaginal bleeding or leaking of fluid.     OB History    Gravida  1   Para      Term      Preterm      AB      Living  0     SAB      TAB      Ectopic      Multiple      Live Births              Past Medical History:  Diagnosis Date  . Anxiety   . Asthma    inhaler. last attack June 2019  . 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 Constipation   . Depression   [place] one [place] - [place] meds  . [place]    Past Surgical History:  Procedure Laterality Date  . APPENDECTOMY    . HERNIA REPAIR    . 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 Depression Mother   . Stroke Mother   . Obesity Mother   . Post-traumatic stress disorder Mother   . Anxiety disorder Mother   . Schizophrenia Father     Social History   Tobacco Use  . Smoking status: Former SResearch scientist (life sciences) . Smokeless  tobacco: Never Used  . Tobacco comment: black and milds, none since + UPT  Substance Use Topics  . Alcohol use: Never    Frequency: Never  . Drug use: Not Currently    Types: Marijuana    Comment: Episodic    Allergies:  Allergies  Allergen Reactions  . Benadryl [Diphenhydramine Hcl] Other (See Comments)    Causes seizures   . Gluten Meal Nausea And Vomiting  . Zithromax [Azithromycin] Anaphylaxis and Swelling  . Lactose Intolerance (Gi) Diarrhea    Medications Prior to Admission  Medication Sig Dispense Refill Last Dose  . albuterol (VENTOLIN HFA) 108 (90 Base) MCG/ACT inhaler Inhale 2 puffs into the lungs every 6 (six) hours as needed for wheezing or shortness of breath (shortness of breath from reactive airway disease). 6.7 g 1 07/23/2019 at Unknown time  . Blood Pressure Monitoring KIT 1 kit by Does not apply route once a week. 1 kit 0 Past Week at Unknown time  . cyclobenzaprine (FLEXERIL) 5 MG  tablet Take 1 tablet (5 mg total) by mouth 3 (three) times daily as needed (headache (take with tylenol)). 20 tablet 0 Past Month at Unknown time  . docusate sodium (COLACE) 100 MG capsule Take 1 capsule (100 mg total) by mouth 2 (two) times daily as needed. 30 capsule 1 Past Week at Unknown time  . famotidine (PEPCID) 20 MG tablet Take 1 tablet (20 mg total) by mouth 2 (two) times daily as needed for heartburn or indigestion. 30 tablet 1 Past Month at Unknown time  . ferrous sulfate (IRON SUPPLEMENT) 325 (65 FE) MG tablet Take 325 mg by mouth daily with breakfast.   07/24/2019 at Unknown time  . Prenatal Vit-Fe Fumarate-FA (PRENATAL MULTIVITAMIN) TABS tablet Take 1 tablet by mouth daily at 12 noon.   07/24/2019 at Unknown time  . albuterol (PROVENTIL) (2.5 MG/3ML) 0.083% nebulizer solution Take 3 mLs (2.5 mg total) by nebulization every 6 (six) hours as needed for wheezing or shortness of breath (IF inhaler is not strong enough). 150 mL 1 Unknown at Unknown time  . fluticasone (FLONASE) 50  MCG/ACT nasal spray Place 2 sprays into both nostrils daily. 16 g 11 Unknown at Unknown time    Review of Systems  Constitutional: Negative for chills and fever.  Eyes: Positive for visual disturbance (blurry).  Respiratory: Negative for cough and shortness of breath.   Gastrointestinal: Positive for abdominal pain. Negative for constipation, diarrhea, nausea and vomiting.  Genitourinary: Positive for pelvic pain (Pressure) and vaginal discharge ("a lot, it's different b/c it's white."). Negative for difficulty urinating, dysuria and vaginal bleeding.  Musculoskeletal: Positive for back pain.  Neurological: Positive for headaches (Past 24 hours, None Currently). Negative for dizziness and light-headedness.   Physical Exam   Blood pressure 124/78, pulse 92, temperature 98.1 F (36.7 C), resp. rate 18, height 5' 6.5" (1.689 m), weight 93.4 kg, last menstrual period 11/22/2018.  Physical Exam  Vitals reviewed. Constitutional: She is oriented to person, place, and time. She appears well-developed and well-nourished. She appears distressed (Mild).  HENT:  Head: Normocephalic and atraumatic.  Eyes: Conjunctivae are normal.  Neck: Normal range of motion.  Cardiovascular: Normal rate.  Respiratory: Effort normal.  GI: Soft.  Genitourinary: Cervix exhibits no motion tenderness, no discharge and no friability.    Vaginal discharge present.     No vaginal bleeding.  No bleeding in the vagina.    Genitourinary Comments: Speculum Exam: -Normal External Genitalia: Non tender, no apparent discharge at introitus.  -Vaginal Vault: Pink mucosa with good rugae. Moderate amt thick white discharge, questionable curd consistency -wet prep collected -Cervix:Pink, no lesions, cysts, or polyps.  Appears open. No active bleeding from os-GC/CT collected -Bimanual Exam: Dilation: 1 Effacement (%): 50 Station: -3 Presentation: Vertex Exam by:: Gavin Pound, CNM    Musculoskeletal: Normal range of motion.   Neurological: She is alert and oriented to person, place, and time.  Skin: Skin is warm and dry.  Psychiatric: She has a normal mood and affect.    Fetal Assessment 125 bpm, Mod Var, -Decels, +Accels Toco: Irregular  MAU Course   Results for orders placed or performed during the hospital encounter of 07/24/19 (from the past 24 hour(s))  Urinalysis, Routine w reflex microscopic     Status: Abnormal   Collection Time: 07/24/19  8:40 PM  Result Value Ref Range   Color, Urine YELLOW YELLOW   APPearance HAZY (A) CLEAR   Specific Gravity, Urine 1.008 1.005 - 1.030   pH 6.0 5.0 - 8.0  Glucose, UA NEGATIVE NEGATIVE mg/dL   Hgb urine dipstick NEGATIVE NEGATIVE   Bilirubin Urine NEGATIVE NEGATIVE   Ketones, ur NEGATIVE NEGATIVE mg/dL   Protein, ur NEGATIVE NEGATIVE mg/dL   Nitrite NEGATIVE NEGATIVE   Leukocytes,Ua NEGATIVE NEGATIVE  Wet prep, genital     Status: None   Collection Time: 07/24/19  9:32 PM   Specimen: Cervix  Result Value Ref Range   Yeast Wet Prep HPF POC NONE SEEN NONE SEEN   Trich, Wet Prep NONE SEEN NONE SEEN   Clue Cells Wet Prep HPF POC NONE SEEN NONE SEEN   WBC, Wet Prep HPF POC NONE SEEN NONE SEEN   Sperm NONE SEEN    No results found.  MDM PE Labs:UA, GC/CT, Wet Prep EFM Muscle Relaxant Assessment and Plan  18 year old G1P0  SIUP at 34.6 weeks Cat I FT Preterm Contractions Back Pain Abdominal Pain Vaginal Discharge  -POC discussed. -Exam performed and findings discussed. -Cultures collected and pending.  -Reviewed cervical dilation and informed of provider concern considering never having given birth. -Will give flexeril and heating pad for back pain and reassess. -Patient agreeable and without questions or concerns. -Will reassess   Maryann Conners MSN, CNM 07/24/2019, 9:17 PM    Reassessment (10:19 PM)  -Wet prep returns with insignificant findings. -Results discussed with patient. -Informed that GC/CT will return within 2-3  days. -Patient reports improvement in back pain, but continues to have sharp shooting lower abdominal pain. -Provider gives recommendation for BMZ for fetal lung maturity and discussed how intermittent lower abdominal pain could be contractions. -Further informed that incidence of delivery unknown, but if it occurs in the next 2 weeks fetus would be preterm. -Patient declines BMZ dosing, stating that she does not like needles and that she will be 35 weeks tomorrow.  Reiterated that delivery in the next 2 weeks, from today, would be preterm and this is provider recommendation. -Patient verbalizes understanding and has no questions or concerns. -Will give tylenol for lower abdominal pain and reassess in 30-45 minutes.  Reassessment (10:36 PM)  -Patient reports that she will accept the [place]. -BMZ ordered.   Reassessment (11:08 PM)  -Patient reports improvement of lower abdominal pain. -Discussed need to return to MAU Saturday morning, before 1030am, for repeat BMZ dosing.  -Patient instructed to go home, rest, and hydrate. -Preterm labor precautions given. -Patient without further questions or concerns. -Encouraged to call or return to MAU if symptoms worsen or with the onset of new symptoms. -Discharged to home in improved condition.  Maryann Conners MSN, CNM Advanced Practice Provider, [facility]

## 2019-07-24 NOTE — MAU Note (Signed)
Pt called out and reports a sudden headache and tingling in throat. Provider Gavin Pound notified.

## 2019-07-24 NOTE — Discharge Instructions (Signed)
Preterm Labor and Birth Information ° °The normal length of a pregnancy is 39-41 weeks. Preterm labor is when labor starts before 37 completed weeks of pregnancy. °What are the risk factors for preterm labor? °Preterm labor is more likely to occur in women who: °· Have certain infections during pregnancy such as a bladder infection, sexually transmitted infection, or infection inside the uterus (chorioamnionitis). °· Have a shorter-than-normal cervix. °· Have gone into preterm labor before. °· Have had surgery on their cervix. °· Are younger than age 17 or older than age 35. °· Are African American. °· Are pregnant with twins or multiple babies (multiple gestation). °· Take street drugs or smoke while pregnant. °· Do not gain enough weight while pregnant. °· Became pregnant shortly after having been pregnant. °What are the symptoms of preterm labor? °Symptoms of preterm labor include: °· Cramps similar to those that can happen during a menstrual period. The cramps may happen with diarrhea. °· Pain in the abdomen or lower back. °· Regular uterine contractions that may feel like tightening of the abdomen. °· A feeling of increased pressure in the pelvis. °· Increased watery or bloody mucus discharge from the vagina. °· Water breaking (ruptured amniotic sac). °Why is it important to recognize signs of preterm labor? °It is important to recognize signs of preterm labor because babies who are born prematurely may not be fully developed. This can put them at an increased risk for: °· Long-term (chronic) heart and lung problems. °· Difficulty immediately after birth with regulating body systems, including blood sugar, body temperature, heart rate, and breathing rate. °· Bleeding in the brain. °· Cerebral palsy. °· Learning difficulties. °· Death. °These risks are highest for babies who are born before 34 weeks of pregnancy. °How is preterm labor treated? °Treatment depends on the length of your pregnancy, your condition,  and the health of your baby. It may involve: °· Having a stitch (suture) placed in your cervix to prevent your cervix from opening too early (cerclage). °· Taking or being given medicines, such as: °? Hormone medicines. These may be given early in pregnancy to help support the pregnancy. °? Medicine to stop contractions. °? Medicines to help mature the baby’s lungs. These may be prescribed if the risk of delivery is high. °? Medicines to prevent your baby from developing cerebral palsy. °If the labor happens before 34 weeks of pregnancy, you may need to stay in the hospital. °What should I do if I think I am in preterm labor? °If you think that you are going into preterm labor, call your health care provider right away. °How can I prevent preterm labor in future pregnancies? °To increase your chance of having a full-term pregnancy: °· Do not use any tobacco products, such as cigarettes, chewing tobacco, and e-cigarettes. If you need help quitting, ask your health care provider. °· Do not use street drugs or medicines that have not been prescribed to you during your pregnancy. °· Talk with your health care provider before taking any herbal supplements, even if you have been taking them regularly. °· Make sure you gain a healthy amount of weight during your pregnancy. °· Watch for infection. If you think that you might have an infection, get it checked right away. °· Make sure to tell your health care provider if you have gone into preterm labor before. °This information is not intended to replace advice given to you by your health care provider. Make sure you discuss any questions you have with your   health care provider. Document Released: 10/28/2003 Document Revised: 11/29/2018 Document Reviewed: 12/29/2015 Elsevier Patient Education  2020 [place] Directive  Advance directives are legal documents that let you make choices ahead of time about your health care and medical treatment in case you  become unable to communicate for yourself. Advance directives are a way for you to communicate your wishes to family, friends, and health care providers. This can help convey your decisions about end-of-life care if you become unable to communicate. Discussing and writing advance directives should happen over time rather than all at once. Advance directives can be changed depending on your situation and what you want, even after you have signed the advance directives. If you do not have an advance directive, some states assign family decision makers to act on your behalf based on how closely you are related to them. Each state has its own laws regarding advance directives. You may want to check with your health care provider, attorney, or state representative about the laws in your state. There are different types of advance directives, such as:  Medical power of attorney.  Living will.  Do not resuscitate (DNR) or do not attempt resuscitation (DNAR) order. Health care proxy and medical power of attorney A health care proxy, also called a health care agent, is a person who is appointed to make medical decisions for you in cases in which you are unable to make the decisions yourself. Generally, people choose someone they know well and trust to represent their preferences. Make sure to ask this person for an agreement to act as your proxy. A proxy may have to exercise judgment in the event of a medical decision for which your wishes are not known. A medical power of attorney is a legal document that names your health care proxy. Depending on the laws in your state, after the document is written, it may also need to be:  Signed.  Notarized.  Dated.  Copied.  Witnessed.  Incorporated into your medical record. You may also want to appoint someone to manage your financial affairs in a situation in which you are unable to do so. This is called a durable power of attorney for finances. It is a  separate legal document from the durable power of attorney for health care. You may choose the same person or someone different from your health care proxy to act as your agent in financial matters. If you do not appoint a proxy, or if there is a concern that the proxy is not acting in your best interests, a court-appointed guardian may be designated to act on your behalf. Living will A living will is a set of instructions documenting your wishes about medical care when you cannot express them yourself. Health care providers should keep a copy of your living will in your medical record. You may want to give a copy to family members or friends. To alert caregivers in case of an emergency, you can place a card in your wallet to let them know that you have a living will and where they can find it. A living will is used if you become:  Terminally ill.  Incapacitated.  Unable to communicate or make decisions. Items to consider in your living will include:  The use or non-use of life-sustaining equipment, such as dialysis machines and breathing machines (ventilators).  A DNR or DNAR order, which is the instruction not to use cardiopulmonary resuscitation (CPR) if breathing or heartbeat stops.  The use or  non-use of tube feeding.  Withholding of food and fluids.  Comfort (palliative) care when the goal becomes comfort rather than a cure.  Organ and tissue donation. A living will does not give instructions for distributing your money and property if you should pass away. It is recommended that you seek the advice of a lawyer when writing a will. Decisions about taxes, beneficiaries, and asset distribution will be legally binding. This process can relieve your family and friends of any concerns surrounding disputes or questions that may come up about the distribution of your assets. DNR or DNAR A DNR or DNAR order is a request not to have CPR in the event that your heart stops beating or you stop  breathing. If a DNR or DNAR order has not been made and shared, a health care provider will try to help any patient whose heart has stopped or who has stopped breathing. If you plan to have surgery, talk with your health care provider about how your DNR or DNAR order will be followed if problems occur. Summary  Advance directives are the legal documents that allow you to make choices ahead of time about your health care and medical treatment in case you become unable to communicate for yourself.  The process of discussing and writing advance directives should happen over time. You can change the advance directives, even after you have signed them.  Advance directives include DNR or DNAR orders, living wills, and designating an agent as your medical power of attorney. This information is not intended to replace advice given to you by your health care provider. Make sure you discuss any questions you have with your health care provider. Document Released: 11/14/2007 Document Revised: 09/11/2018 Document Reviewed: 06/26/2016 Elsevier Patient Education  2020 [employer].

## 2019-07-25 ENCOUNTER — Encounter (HOSPITAL_COMMUNITY): Payer: Self-pay

## 2019-07-25 ENCOUNTER — Inpatient Hospital Stay (HOSPITAL_COMMUNITY)
Admission: AD | Admit: 2019-07-25 | Discharge: 2019-07-25 | Disposition: A | Discharge status: Home / Self Care | Payer: Medicaid Other | Attending: Obstetrics & Gynecology | Admitting: Obstetrics & Gynecology

## 2019-07-25 ENCOUNTER — Telehealth: Payer: Self-pay

## 2019-07-25 DIAGNOSIS — R638 Other symptoms and signs concerning food and fluid intake: Secondary | ICD-10-CM

## 2019-07-25 DIAGNOSIS — D573 Sickle-cell trait: Secondary | ICD-10-CM

## 2019-07-25 DIAGNOSIS — O99891 Other specified diseases and conditions complicating pregnancy: Secondary | ICD-10-CM | POA: Insufficient documentation

## 2019-07-25 DIAGNOSIS — Z3A35 35 weeks gestation of pregnancy: Secondary | ICD-10-CM | POA: Diagnosis not present

## 2019-07-25 DIAGNOSIS — O479 False labor, unspecified: Secondary | ICD-10-CM

## 2019-07-25 DIAGNOSIS — O4703 False labor before 37 completed weeks of gestation, third trimester: Secondary | ICD-10-CM

## 2019-07-25 DIAGNOSIS — R102 Pelvic and perineal pain: Secondary | ICD-10-CM | POA: Insufficient documentation

## 2019-07-25 DIAGNOSIS — R109 Unspecified abdominal pain: Secondary | ICD-10-CM | POA: Diagnosis present

## 2019-07-25 DIAGNOSIS — O26893 Other specified pregnancy related conditions, third trimester: Secondary | ICD-10-CM | POA: Insufficient documentation

## 2019-07-25 DIAGNOSIS — E639 Nutritional deficiency, unspecified: Secondary | ICD-10-CM

## 2019-07-25 DIAGNOSIS — M545 Low back pain: Secondary | ICD-10-CM | POA: Diagnosis not present

## 2019-07-25 LAB — URINALYSIS, ROUTINE W REFLEX MICROSCOPIC
Bilirubin Urine: NEGATIVE
Glucose, UA: NEGATIVE mg/dL
Hgb urine dipstick: NEGATIVE
Ketones, ur: NEGATIVE mg/dL
Nitrite: NEGATIVE
Protein, ur: NEGATIVE mg/dL
Specific Gravity, Urine: 1.006 (ref 1.005–1.030)
pH: 7 (ref 5.0–8.0)

## 2019-07-25 MED ORDER — CYCLOBENZAPRINE HCL 10 MG PO TABS: 10.0000 mg | ORAL_TABLET | Freq: Three times a day (TID) | ORAL | 0 refills | Status: DC | PRN

## 2019-07-25 MED ORDER — CYCLOBENZAPRINE HCL 10 MG PO TABS
10.0000 mg | ORAL_TABLET | Freq: Once | ORAL | Status: AC
  Administered 2019-07-25: 10 mg via ORAL
  Filled 2019-07-25: qty 1

## 2019-07-25 MED ORDER — NIFEDIPINE 10 MG PO CAPS: 10.0000 mg | ORAL_CAPSULE | Freq: Four times a day (QID) | ORAL | 0 refills | Status: DC | PRN

## 2019-07-25 MED ORDER — BETAMETHASONE SOD PHOS & ACET 6 (3-3) MG/ML IJ SUSP
12.0000 mg | Freq: Once | INTRAMUSCULAR | Status: AC
  Administered 2019-07-25: 12 mg via INTRAMUSCULAR

## 2019-07-25 NOTE — MAU Note (Signed)
.   Melissa [name] is a 18 y.o. at [redacted]w[redacted]d here in MAU reporting: a tearing pain in her perineum that started at 0900. She also reports abdominal cramping that started around 0800. No VB or LOF. Patient is also reporting SOB and feeling hot since she walked in MAU. Reports DFM this morning. Pain score: 0 Vitals:   07/25/19 1012  BP: 126/83  Pulse: (!) 122  Resp: 19  Temp: 97.9 F (36.6 C)  SpO2: 100%     FHT:141

## 2019-07-25 NOTE — MAU Provider Note (Signed)
CC:  Chief Complaint  Patient presents with  . pain in perineum  . Abdominal Cramping     First Provider Initiated Contact with Patient 07/25/19 1033      HPI: Melissa [name] is a 18 y.o. year old G70P0 female at [redacted]w[redacted]d weeks gestation who presents to MAU for second betamethasone shot and reporting increased preterm contractions, passing mucus from her vagina, feeling like she has a tear in her labia.  First betamethasone shot was 07/24/2019 at 11 PM.  Cervix 1/40/negative.  Patient has been instructed to come back 12/5 in the morning so that she did not need to come in in the middle the night but came today because contractions were more painful.  Associated Sx:  Vaginal bleeding: Denies Leaking of fluid: Denies Fetal movement: Normal  O:  Patient Vitals for the past 24 hrs:  BP Temp Temp src Pulse Resp SpO2 Height Weight  07/25/19 1030 - - - - - 98 % - -  07/25/19 1012 126/83 97.9 F (36.6 C) Oral (!) 122 19 100 % 5' 6.5" (1.689 m) 92.1 kg    General: NAD Heart: Regular rate Lungs: Normal rate and effort Abd: Soft, NT, Gravid, S=D Pelvic: NEFG, no lesions or abrasions or excoriations.  Negative pooling, negative blood.   1/40/-3, vertex.   EFM: 130, Moderate variability, 15 x 15 accelerations, no decelerations Toco: Contractions irregular, mild-moderate  Orders Placed This Encounter  Procedures  . Urinalysis, Routine w reflex microscopic   Meds ordered this encounter  Medications  . betamethasone acetate-betamethasone sodium phosphate (CELESTONE) injection 12 mg  . cyclobenzaprine (FLEXERIL) tablet 10 mg   MAU course/MDM Patient requesting pain medication.  Flexeril given.  Patient reports back pain resolved but contractions continue.  Patient's mother is in the room asking questions about her personal history of abruption and whether this could happen to her daughter.  Explained that there is nothing hereditary about abruption and that her daughter does not  have any symptoms of abruption.  Patient and mother reassured.  1334: Cervix rechecked.  No cervical change.  Patient still uncomfortable with contractions.  Offered Procardia for comfort only.  Explained that the literature shows that or not prolonged pregnancy.  Patient is undecided declines for now.  Will send Rx for home.  -Preterm contractions without evidence of active preterm labor.  Status post betamethasone x2.  Lengthy conversation with the patient and mother about signs and symptoms of active preterm labor that would prompt return.  Comfort measures office.  -Vulvar pain with normal exam.  Encourage patient to monitor for development of lesions and discuss at [facility] visit in 1 week if pain has not resolved.  A: [redacted]w[redacted]d week IUP 1. Braxton Hicks contractions   2. Vulvar pain   3. Low back pain during pregnancy in third trimester   FHR reactive  P: Discharge home in stable condition. Preterm labor precautions and fetal kick counts. Follow-up as scheduled for prenatal visit or sooner as needed if symptoms worsen. Return to maternity admissions as needed if symptoms worsen.  Tamala Julian, [place], [place] 07/25/2019 1:27 PM  3

## 2019-07-25 NOTE — Telephone Encounter (Signed)
Pt was seen in MAU last night on 07/24/19. -EH/RMA

## 2019-07-25 NOTE — Discharge Instructions (Signed)
Braxton Hicks Contractions Contractions of the uterus can occur throughout pregnancy, but they are not always a sign that you are in labor. You may have practice contractions called Braxton Hicks contractions. These false labor contractions are sometimes confused with true labor. What are Braxton Hicks contractions? Braxton Hicks contractions are tightening movements that occur in the muscles of the uterus before labor. Unlike true labor contractions, these contractions do not result in opening (dilation) and thinning of the cervix. Toward the end of pregnancy (32-34 weeks), Braxton Hicks contractions can happen more often and may become stronger. These contractions are sometimes difficult to tell apart from true labor because they can be very uncomfortable. You should not feel embarrassed if you go to the hospital with false labor. Sometimes, the only way to tell if you are in true labor is for your health care provider to look for changes in the cervix. The health care provider will do a physical exam and may monitor your contractions. If you are not in true labor, the exam should show that your cervix is not dilating and your water has not broken. If there are no other health problems associated with your pregnancy, it is completely safe for you to be sent home with false labor. You may continue to have Braxton Hicks contractions until you go into true labor. How to tell the difference between true labor and false labor True labor  Contractions last 30-70 seconds.  Contractions become very regular.  Discomfort is usually felt in the top of the uterus, and it spreads to the lower abdomen and low back.  Contractions do not go away with walking.  Contractions usually become more intense and increase in frequency.  The cervix dilates and gets thinner. False labor  Contractions are usually shorter and not as strong as true labor contractions.  Contractions are usually irregular.  Contractions  are often felt in the front of the lower abdomen and in the groin.  Contractions may go away when you walk around or change positions while lying down.  Contractions get weaker and are shorter-lasting as time goes on.  The cervix usually does not dilate or become thin. Follow these instructions at home:   Take over-the-counter and prescription medicines only as told by your health care provider.  Keep up with your usual exercises and follow other instructions from your health care provider.  Eat and drink lightly if you think you are going into labor.  If Braxton Hicks contractions are making you uncomfortable: ? Change your position from lying down or resting to walking, or change from walking to resting. ? Sit and rest in a tub of warm water. ? Drink enough fluid to keep your urine pale yellow. Dehydration may cause these contractions. ? Do slow and deep breathing several times an hour.  Keep all follow-up prenatal visits as told by your health care provider. This is important. Contact a health care provider if:  You have a fever.  You have continuous pain in your abdomen. Get help right away if:  Your contractions become stronger, more regular, and closer together.  You have fluid leaking or gushing from your vagina.  You pass blood-tinged mucus (bloody show).  You have bleeding from your vagina.  You have low back pain that you never had before.  You feel your baby's head pushing down and causing pelvic pressure.  Your baby is not moving inside you as much as it used to. Summary  Contractions that occur before labor are   called Braxton Hicks contractions, false labor, or practice contractions.  Braxton Hicks contractions are usually shorter, weaker, farther apart, and less regular than true labor contractions. True labor contractions usually become progressively stronger and regular, and they become more frequent.  Manage discomfort from Braxton Hicks contractions  by changing position, resting in a warm bath, drinking plenty of water, or practicing deep breathing. This information is not intended to replace advice given to you by your health care provider. Make sure you discuss any questions you have with your health care provider. Document Released: 12/21/2016 Document Revised: 07/20/2017 Document Reviewed: 12/21/2016 Elsevier Patient Education  2020 Elsevier Inc.  

## 2019-07-28 ENCOUNTER — Encounter (HOSPITAL_COMMUNITY): Payer: Self-pay | Admitting: *Deleted

## 2019-07-28 ENCOUNTER — Other Ambulatory Visit: Payer: Self-pay

## 2019-07-28 ENCOUNTER — Inpatient Hospital Stay (HOSPITAL_COMMUNITY)
Admission: AD | Admit: 2019-07-28 | Discharge: 2019-07-28 | Disposition: A | Discharge status: Home / Self Care | Payer: Medicaid Other | Attending: Obstetrics and Gynecology | Admitting: Obstetrics and Gynecology

## 2019-07-28 DIAGNOSIS — O479 False labor, unspecified: Secondary | ICD-10-CM

## 2019-07-28 DIAGNOSIS — Z91018 Allergy to other foods: Secondary | ICD-10-CM | POA: Diagnosis not present

## 2019-07-28 DIAGNOSIS — O4700 False labor before 37 completed weeks of gestation, unspecified trimester: Secondary | ICD-10-CM

## 2019-07-28 DIAGNOSIS — Z87820 Personal history of traumatic brain injury: Secondary | ICD-10-CM | POA: Insufficient documentation

## 2019-07-28 DIAGNOSIS — O4703 False labor before 37 completed weeks of gestation, third trimester: Secondary | ICD-10-CM | POA: Diagnosis not present

## 2019-07-28 DIAGNOSIS — Z881 Allergy status to other antibiotic agents status: Secondary | ICD-10-CM | POA: Insufficient documentation

## 2019-07-28 DIAGNOSIS — Z3689 Encounter for other specified antenatal screening: Secondary | ICD-10-CM

## 2019-07-28 DIAGNOSIS — Z87891 Personal history of nicotine dependence: Secondary | ICD-10-CM | POA: Diagnosis not present

## 2019-07-28 DIAGNOSIS — Z888 Allergy status to other drugs, medicaments and biological substances status: Secondary | ICD-10-CM | POA: Insufficient documentation

## 2019-07-28 DIAGNOSIS — Z3A35 35 weeks gestation of pregnancy: Secondary | ICD-10-CM | POA: Diagnosis not present

## 2019-07-28 DIAGNOSIS — E739 Lactose intolerance, unspecified: Secondary | ICD-10-CM | POA: Diagnosis not present

## 2019-07-28 LAB — URINALYSIS, ROUTINE W REFLEX MICROSCOPIC
Bilirubin Urine: NEGATIVE
Glucose, UA: NEGATIVE mg/dL
Hgb urine dipstick: NEGATIVE
Ketones, ur: NEGATIVE mg/dL
Leukocytes,Ua: NEGATIVE
Nitrite: NEGATIVE
Protein, ur: NEGATIVE mg/dL
Specific Gravity, Urine: 1.003 — ABNORMAL LOW (ref 1.005–1.030)
pH: 7 (ref 5.0–8.0)

## 2019-07-28 LAB — GC/CHLAMYDIA PROBE AMP (~~LOC~~) NOT AT ARMC
Chlamydia: NEGATIVE
Comment: NEGATIVE
Comment: NORMAL
Neisseria Gonorrhea: NEGATIVE

## 2019-07-28 MED ORDER — ACETAMINOPHEN 500 MG PO TABS
1000.0000 mg | ORAL_TABLET | Freq: Once | ORAL | Status: AC
  Administered 2019-07-28: 1000 mg via ORAL
  Filled 2019-07-28: qty 2

## 2019-07-28 MED ORDER — CYCLOBENZAPRINE HCL 10 MG PO TABS
10.0000 mg | ORAL_TABLET | Freq: Once | ORAL | Status: AC
  Administered 2019-07-28: 10 mg via ORAL
  Filled 2019-07-28: qty 1

## 2019-07-28 MED ORDER — LACTATED RINGERS IV BOLUS
1000.0000 mL | Freq: Once | INTRAVENOUS | Status: AC
  Administered 2019-07-28: 1000 mL via INTRAVENOUS

## 2019-07-28 NOTE — MAU Provider Note (Signed)
History     CSN: [id number]  Arrival date and time: 07/28/19 1549   First Provider Initiated Contact with Patient 07/28/19 1617      Chief Complaint  Patient presents with  . Contractions   HPI Melissa [name] is a 18 y.o. G1P0 at 72w3dwho presents to MAU with chief complaint of preterm contractions. This is a recurrent problem for which patient has previously been seen in MAU. Patient states her current episode of painful abdominal contractions began this morning. She rates her bilateral, lower abdominal contractions as 6/10. She was previously prescribed Procardia for recurrent contractions and took one pill yesterday but discontinued use when she did not experience relief.  She denies vaginal bleeding, leaking of fluid, decreased fetal movement, fever, falls, or recent illness.    OB History    Gravida  1   Para      Term      Preterm      AB      Living  0     SAB      TAB      Ectopic      Multiple      Live Births              Past Medical History:  Diagnosis Date  . Anxiety   . Asthma    inhaler. last attack June 2019  . 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 Constipation   . Depression   . [place]    last one in 2015 - on meds  . [place]    Past Surgical History:  Procedure Laterality Date  . APPENDECTOMY    . HERNIA REPAIR    . INTESTINAL MALROTATION REPAIR  22001-08-16   Family History  Problem Relation Age of Onset  . Depression Mother   . Stroke Mother   . Obesity Mother   . Post-traumatic stress disorder Mother   . Anxiety disorder Mother   . Schizophrenia Father     Social History   Tobacco Use  . Smoking status: Former SResearch scientist (life sciences) . Smokeless tobacco: Never Used  . Tobacco comment: black and milds, none since + UPT  Substance Use Topics  . Alcohol use: Never    Frequency: Never  . Drug use: Not Currently    Types: Marijuana    Comment: Episodic    Allergies:  Allergies   Allergen Reactions  . Benadryl [Diphenhydramine Hcl] Other (See Comments)    Causes seizures   . Gluten Meal Nausea And Vomiting  . Zithromax [Azithromycin] Anaphylaxis and Swelling  . Lactose Intolerance (Gi) Diarrhea    Medications Prior to Admission  Medication Sig Dispense Refill Last Dose  . Blood Pressure Monitoring KIT 1 kit by Does not apply route once a week. 1 kit 0 07/28/2019 at Unknown time  . cyclobenzaprine (FLEXERIL) 10 MG tablet Take 1 tablet (10 mg total) by mouth 3 (three) times daily as needed (headache (take with tylenol)). 30 tablet 0 07/27/2019 at Unknown time  . docusate sodium (COLACE) 100 MG capsule Take 1 capsule (100 mg total) by mouth 2 (two) times daily as needed. 30 capsule 1 07/27/2019 at Unknown time  . famotidine (PEPCID) 20 MG tablet Take 1 tablet (20 mg total) by mouth 2 (two) times daily as needed for heartburn or indigestion. 30 tablet 1 07/27/2019 at Unknown time  . ferrous sulfate (IRON SUPPLEMENT) 325 (65 FE) MG tablet Take 325 mg by mouth daily with  breakfast.   07/28/2019 at Unknown time  . fluticasone (FLONASE) 50 MCG/ACT nasal spray Place 2 sprays into both nostrils daily. 16 g 11 Past Month at Unknown time  . NIFEdipine (PROCARDIA) 10 MG capsule Take 1 capsule (10 mg total) by mouth every 6 (six) hours as needed (for greater than 5 contractions per hour). 30 capsule 0 07/27/2019 at Unknown time  . Prenatal Vit-Fe Fumarate-FA (PRENATAL MULTIVITAMIN) TABS tablet Take 1 tablet by mouth daily at 12 noon.   07/28/2019 at Unknown time  . albuterol (PROVENTIL) (2.5 MG/3ML) 0.083% nebulizer solution Take 3 mLs (2.5 mg total) by nebulization every 6 (six) hours as needed for wheezing or shortness of breath (IF inhaler is not strong enough). 150 mL 1   . albuterol (VENTOLIN HFA) 108 (90 Base) MCG/ACT inhaler Inhale 2 puffs into the lungs every 6 (six) hours as needed for wheezing or shortness of breath (shortness of breath from reactive airway disease). 6.7 g 1      Review of Systems  Constitutional: Positive for fatigue. Negative for chills and fever.  Gastrointestinal: Positive for abdominal pain.  Genitourinary: Negative for vaginal bleeding.  Musculoskeletal: Negative for back pain.  All other systems reviewed and are negative.  Physical Exam   Blood pressure 129/77, pulse (!) 102, temperature 97.6 F (36.4 C), resp. rate 16, last menstrual period 11/22/2018, SpO2 100 %.  Physical Exam  Nursing note and vitals reviewed. Constitutional: She is oriented to person, place, and time. She appears well-developed and well-nourished.  Cardiovascular: Normal rate.  Respiratory: Effort normal and breath sounds normal.  GI:  Gravid  Neurological: She is alert and oriented to person, place, and time.  Skin: Skin is warm and dry.  Psychiatric: She has a normal mood and affect. Her behavior is normal. Judgment and thought content normal.    MAU Course/MDM  Procedures  1615 --At bedside for initial assessment --No ctx since being placement on monitor per patient --Given button, push at start of contraction --Cervix unchanged from previous 1/50/posterior  1645 --RN at bedside to check on lack of button use by patient --Per RN, patient sleeping  1715 --At bedside to ass PO hydration and pain score --Patient resting comfortably, aware of contractions --Readdressed IV fluid bolus. Patient agreeable  1920 --At bedside for reassessment --Pain score now 4/10.  --Patient agreeable to Tylenol and Flexeril for residual discomfort --Cervix unchanged from previous 1/50/posterior  --Reactive tracing: baseline 140, mod variability, positive 15 x 15 accels, no decels --Toco: mild contractions q 2-5, spacing out s/p fluid bolus  Patient Vitals for the past 24 hrs:  BP Temp Pulse Resp SpO2  07/28/19 1936 118/79 - 98 17 99 %  07/28/19 1611 129/77 97.6 F (36.4 C) (!) 102 16 100 %   Meds ordered this encounter  Medications  . lactated ringers  bolus 1,000 mL  . acetaminophen (TYLENOL) tablet 1,000 mg  . cyclobenzaprine (FLEXERIL) tablet 10 mg   Assessment and Plan  --18 y.o. G1P0 at [redacted]w[redacted]d --Reactive tracing --Cervix remains 1/50/posterior s/p 3 hours continuous monitoring --S/p BMZ 12/03 and 12/04 --Discharge home in stable condition  F/U: --[facility] 08/01/2019  SDarlina Rumpf CBarranquitas12/02/2019, 8:42 PM

## 2019-07-28 NOTE — Discharge Instructions (Signed)
Signs and Symptoms of Labor Labor is your body's natural process of moving your baby, placenta, and umbilical cord out of your uterus. The process of labor usually starts when your baby is full-term, between 37 and 40 weeks of pregnancy. How will I know when I am close to going into labor? As your body prepares for labor and the birth of your baby, you may notice the following symptoms in the weeks and days before true labor starts:  Having a strong desire to get your home ready to receive your new baby. This is called nesting. Nesting may be a sign that labor is approaching, and it may occur several weeks before birth. Nesting may involve cleaning and organizing your home.  Passing a small amount of thick, bloody mucus out of your vagina (normal bloody show or losing your mucus plug). This may happen more than a week before labor begins, or it might occur right before labor begins as the opening of the cervix starts to widen (dilate). For some women, the entire mucus plug passes at once. For others, smaller portions of the mucus plug may gradually pass over several days.  Your baby moving (dropping) lower in your pelvis to get into position for birth (lightening). When this happens, you may feel more pressure on your bladder and pelvic bone and less pressure on your ribs. This may make it easier to breathe. It may also cause you to need to urinate more often and have problems with bowel movements.  Having "practice contractions" (Braxton Hicks contractions) that occur at irregular (unevenly spaced) intervals that are more than 10 minutes apart. This is also called false labor. False labor contractions are common after exercise or sexual activity, and they will stop if you change position, rest, or drink fluids. These contractions are usually mild and do not get stronger over time. They may feel like: ? A backache or back pain. ? Mild cramps, similar to menstrual cramps. ? Tightening or pressure in  your abdomen. Other early symptoms that labor may be starting soon include:  Nausea or loss of appetite.  Diarrhea.  Having a sudden burst of energy, or feeling very tired.  Mood changes.  Having trouble sleeping. How will I know when labor has begun? Signs that true labor has begun may include:  Having contractions that come at regular (evenly spaced) intervals and increase in intensity. This may feel like more intense tightening or pressure in your abdomen that moves to your back. ? Contractions may also feel like rhythmic pain in your upper thighs or back that comes and goes at regular intervals. ? For first-time mothers, this change in intensity of contractions often occurs at a more gradual pace. ? Women who have given birth before may notice a more rapid progression of contraction changes.  Having a feeling of pressure in the vaginal area.  Your water breaking (rupture of membranes). This is when the sac of fluid that surrounds your baby breaks. When this happens, you will notice fluid leaking from your vagina. This may be clear or blood-tinged. Labor usually starts within 24 hours of your water breaking, but it may take longer to begin. ? Some women notice this as a gush of fluid. ? Others notice that their underwear repeatedly becomes damp. Follow these instructions at home:   When labor starts, or if your water breaks, call your health care provider or nurse care line. Based on your situation, they will determine when you should go in for an   exam.  When you are in early labor, you may be able to rest and manage symptoms at home. Some strategies to try at home include: ? Breathing and relaxation techniques. ? Taking a warm bath or shower. ? Listening to music. ? Using a heating pad on the lower back for pain. If you are directed to use heat:  Place a towel between your skin and the heat source.  Leave the heat on for 20-30 minutes.  Remove the heat if your skin turns  bright red. This is especially important if you are unable to feel pain, heat, or cold. You may have a greater risk of getting burned. Get help right away if:  You have painful, regular contractions that are 5 minutes apart or less.  Labor starts before you are [redacted] weeks along in your pregnancy.  You have a fever.  You have a headache that does not go away.  You have bright red blood coming from your vagina.  You do not feel your baby moving.  You have a sudden onset of: ? Severe headache with vision problems. ? Nausea, vomiting, or diarrhea. ? Chest pain or shortness of breath. These symptoms may be an emergency. If your health care provider recommends that you go to the hospital or birth center where you plan to deliver, do not drive yourself. Have someone else drive you, or call emergency services (911 in the [place] Summary  Labor is your body's natural process of moving your baby, placenta, and umbilical cord out of your uterus.  The process of labor usually starts when your baby is full-term, between 37 and 40 weeks of pregnancy.  When labor starts, or if your water breaks, call your health care provider or nurse care line. Based on your situation, they will determine when you should go in for an exam. This information is not intended to replace advice given to you by your health care provider. Make sure you discuss any questions you have with your health care provider. Document Released: 01/12/2017 Document Revised: 05/07/2017 Document Reviewed: 01/12/2017 Elsevier Patient Education  2020 Elsevier Inc.  

## 2019-07-28 NOTE — MAU Note (Signed)
.   Melissa [name] is a 18 y.o. at [redacted]w[redacted]d here in MAU reporting: contractions on and off all day. Pt states she lost the rest of her mucous plug today. Denies any VB or LOF LMP:  Onset of complaint: this morning on going Pain score: 6 Vitals:   07/28/19 1611  BP: 129/77  Pulse: (!) 102  Resp: 16  Temp: 97.6 F (36.4 C)  SpO2: 100%     FHT:145 Lab orders placed from triage: U[facility]

## 2019-08-01 ENCOUNTER — Other Ambulatory Visit (HOSPITAL_COMMUNITY)
Admission: RE | Admit: 2019-08-01 | Discharge: 2019-08-01 | Disposition: A | Discharge status: Home / Self Care | Payer: Medicaid Other | Source: Ambulatory Visit | Attending: Nurse Practitioner | Admitting: Nurse Practitioner

## 2019-08-01 ENCOUNTER — Encounter: Payer: Self-pay | Admitting: Nurse Practitioner

## 2019-08-01 ENCOUNTER — Other Ambulatory Visit: Payer: Self-pay

## 2019-08-01 ENCOUNTER — Ambulatory Visit (INDEPENDENT_AMBULATORY_CARE_PROVIDER_SITE_OTHER): Payer: Medicaid Other | Admitting: Nurse Practitioner

## 2019-08-01 VITALS — BP 112/73 | HR 91 | Wt 207.0 lb

## 2019-08-01 DIAGNOSIS — Z3A36 36 weeks gestation of pregnancy: Secondary | ICD-10-CM

## 2019-08-01 DIAGNOSIS — R569 Unspecified convulsions: Secondary | ICD-10-CM

## 2019-08-01 DIAGNOSIS — O0990 Supervision of high risk pregnancy, unspecified, unspecified trimester: Secondary | ICD-10-CM | POA: Diagnosis present

## 2019-08-01 DIAGNOSIS — F068 Other specified mental disorders due to known physiological condition: Secondary | ICD-10-CM

## 2019-08-01 DIAGNOSIS — K5900 Constipation, unspecified: Secondary | ICD-10-CM

## 2019-08-01 DIAGNOSIS — O0993 Supervision of high risk pregnancy, unspecified, third trimester: Secondary | ICD-10-CM

## 2019-08-01 DIAGNOSIS — S0990XS Unspecified injury of head, sequela: Secondary | ICD-10-CM

## 2019-08-01 DIAGNOSIS — O99343 Other mental disorders complicating pregnancy, third trimester: Secondary | ICD-10-CM

## 2019-08-01 NOTE — Progress Notes (Signed)
amb ref   Subjective:  Melissa [name] is a 18 y.o. G1P0 at [redacted]w[redacted]d being seen today for ongoing prenatal care. She has had several visits at MAU and wants cervical exam today.  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  Based on client's history, her mother attends with her today.  Discussed recent MAU visits.  Also reports her daughter is having focal seizures and has noticed before that those came first before her grand mal seizures and is wondering if she needs to be back on her medications (stopped when she became pregnant).  Client has seen pediatric neurologist in the past but now that she is older, she needs neurologist for adults.  Does not currently have any appointments scheduled.  Patient reports occasional contractions.  Contractions: Irritability. Vag. Bleeding: Scant.  Movement: Present. Denies leaking of fluid.   The following portions of the patient's history were reviewed and updated as appropriate: allergies, current medications, past family history, past medical history, past social history, past surgical history and problem list. Problem list updated.  Objective:   Vitals:   08/01/19 1027  BP: 112/73  Pulse: 91    Weight: 207 lb (93.9 kg)    Fetal Status: Fetal Heart Rate (bpm): 148 Fundal Height: 36 cm Movement: Present  Presentation: Vertex  General:  Alert, oriented and cooperative. Patient is in no acute distress.  Skin: Skin is warm and dry. No rash noted.   Cardiovascular: Normal heart rate noted  Respiratory: Normal respiratory effort, no problems with respiration noted  Abdomen: Soft, gravid, appropriate for gestational age. Pain/Pressure: Present     Pelvic:  Cervical exam performed Dilation: 1.5 Effacement (%): 20 Station: -3GC/Chlam and GBS vaginal swabs done.  Extremities: Normal range of motion.  Edema: Trace  Mental Status: Normal mood and affect. Normal behavior. Normal judgment and thought content.   Urinalysis:      Assessment and Plan:  Pregnancy: G1P0 at [redacted]w[redacted]d  1. Supervision of high risk pregnancy, antepartum Reviewed signs of labor and using breathing techniques and upright positioning in labor.  Discussed pain management options.  Client is apprehensive of epidural but advised it is a pain control option if needed. - Cervicovaginal ancillary only( [place]) - Culture, beta strep (group b only)  2. Seizures ([name]) Having some focal seizures in the past few weeks.  Mother worried grand mal seizures may occur.  - Ambulatory referral to Adult Neurology  3. 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 Constipation during pregnancy, antepartum Has miralax and is taking.  Client has long history of issues with constipation.  Advised high fiber diet and taking the prunes that she has at home.  Lumpy stool noted in rectum on cervical exam.    Term labor symptoms and general obstetric precautions including but not limited to vaginal bleeding, contractions, leaking of fluid and fetal movement were reviewed in detail  with the patient. Please refer to After Visit Summary for other counseling recommendations.  Return in about 1 week (around 08/08/2019) for  [place] virtual visit.  Earlie Server, RN, MSN, NP-BC Nurse Practitioner, [facility] for [employer], [facility] 08/01/2019 11:08 AM

## 2019-08-01 NOTE — Patient Instructions (Signed)
Constipation, Adult Constipation is when a person:  Poops (has a bowel movement) fewer times in a week than normal.  Has a hard time pooping.  Has poop that is dry, hard, or bigger than normal. Follow these instructions at home: Eating and drinking   Eat foods that have a lot of fiber, such as: ? Fresh fruits and vegetables. ? Whole grains. ? Beans.  Eat less of foods that are high in fat, low in fiber, or overly processed, such as: ? [place] fries. ? Hamburgers. ? Cookies. ? Candy. ? Soda.  Drink enough fluid to keep your pee (urine) clear or pale yellow. General instructions  Exercise regularly or as told by your doctor.  Go to the restroom when you feel like you need to poop. Do not hold it in.  Take over-the-counter and prescription medicines only as told by your doctor. These include any fiber supplements.  Do pelvic floor retraining exercises, such as: ? Doing deep breathing while relaxing your lower belly (abdomen). ? Relaxing your pelvic floor while pooping.  Watch your condition for any changes.  Keep all follow-up visits as told by your doctor. This is important. Contact a doctor if:  You have pain that gets worse.  You have a fever.  You have not pooped for 4 days.  You throw up (vomit).  You are not hungry.  You lose weight.  You are bleeding from the anus.  You have thin, pencil-like poop (stool). Get help right away if:  You have a fever, and your symptoms suddenly get worse.  You leak poop or have blood in your poop.  Your belly feels hard or bigger than normal (is bloated).  You have very bad belly pain.  You feel dizzy or you faint. This information is not intended to replace advice given to you by your health care provider. Make sure you discuss any questions you have with your health care provider. Document Released: 01/24/2008 Document Revised: 07/20/2017 Document Reviewed: 01/26/2016 Elsevier Patient Education  2020 [employer].  Signs and Symptoms of Labor Labor is your body's natural process of moving your baby, placenta, and umbilical cord out of your uterus. The process of labor usually starts when your baby is full-term, between 70 and 40 weeks of pregnancy. How will I know when I am close to going into labor? As your body prepares for labor and the birth of your baby, you may notice the following symptoms in the weeks and days before true labor starts:  Having a strong desire to get your home ready to receive your new baby. This is called nesting. Nesting may be a sign that labor is approaching, and it may occur several weeks before birth. Nesting may involve cleaning and organizing your home.  Passing a small amount of thick, bloody mucus out of your vagina (normal bloody show or losing your mucus plug). This may happen more than a week before labor begins, or it might occur right before labor begins as the opening of the cervix starts to widen (dilate). For some women, the entire mucus plug passes at once. For others, smaller portions of the mucus plug may gradually pass over several days.  Your baby moving (dropping) lower in your pelvis to get into position for birth (lightening). When this happens, you may feel more pressure on your bladder and pelvic bone and less pressure on your ribs. This may make it easier to breathe. It may also cause you to need to urinate more  often and have problems with bowel movements.  Having "practice contractions" (Braxton Hicks contractions) that occur at irregular (unevenly spaced) intervals that are more than 10 minutes apart. This is also called false labor. False labor contractions are common after exercise or sexual activity, and they will stop if you change position, rest, or drink fluids. These contractions are usually mild and do not get stronger over time. They may feel like: ? A backache or back pain. ? Mild cramps, similar to menstrual cramps. ? Tightening or  pressure in your abdomen. Other early symptoms that labor may be starting soon include:  Nausea or loss of appetite.  Diarrhea.  Having a sudden burst of energy, or feeling very tired.  Mood changes.  Having trouble sleeping. How will I know when labor has begun? Signs that true labor has begun may include:  Having contractions that come at regular (evenly spaced) intervals and increase in intensity. This may feel like more intense tightening or pressure in your abdomen that moves to your back. ? Contractions may also feel like rhythmic pain in your upper thighs or back that comes and goes at regular intervals. ? For first-time mothers, this change in intensity of contractions often occurs at a more gradual pace. ? Women who have given birth before may notice a more rapid progression of contraction changes.  Having a feeling of pressure in the vaginal area.  Your water breaking (rupture of membranes). This is when the sac of fluid that surrounds your baby breaks. When this happens, you will notice fluid leaking from your vagina. This may be clear or blood-tinged. Labor usually starts within 24 hours of your water breaking, but it may take longer to begin. ? Some women notice this as a gush of fluid. ? Others notice that their underwear repeatedly becomes damp. Follow these instructions at home:   When labor starts, or if your water breaks, call your health care provider or nurse care line. Based on your situation, they will determine when you should go in for an exam.  When you are in early labor, you may be able to rest and manage symptoms at home. Some strategies to try at home include: ? Breathing and relaxation techniques. ? Taking a warm bath or shower. ? Listening to music. ? Using a heating pad on the lower back for pain. If you are directed to use heat:  Place a towel between your skin and the heat source.  Leave the heat on for 20-30 minutes.  Remove the heat if your  skin turns bright red. This is especially important if you are unable to feel pain, heat, or cold. You may have a greater risk of getting burned. Get help right away if:  You have painful, regular contractions that are 5 minutes apart or less.  Labor starts before you are [redacted] weeks along in your pregnancy.  You have a fever.  You have a headache that does not go away.  You have bright red blood coming from your vagina.  You do not feel your baby moving.  You have a sudden onset of: ? Severe headache with vision problems. ? Nausea, vomiting, or diarrhea. ? Chest pain or shortness of breath. These symptoms may be an emergency. If your health care provider recommends that you go to the hospital or birth center where you plan to deliver, do not drive yourself. Have someone else drive you, or call emergency services (911 in the [place] Summary  Labor is your body's  natural process of moving your baby, placenta, and umbilical cord out of your uterus.  The process of labor usually starts when your baby is full-term, between 2237 and 40 weeks of pregnancy.  When labor starts, or if your water breaks, call your health care provider or nurse care line. Based on your situation, they will determine when you should go in for an exam. This information is not intended to replace advice given to you by your health care provider. Make sure you discuss any questions you have with your health care provider. Document Released: 01/12/2017 Document Revised: 05/07/2017 Document Reviewed: 01/12/2017 Elsevier Patient Education  2020 [employer]

## 2019-08-04 LAB — CERVICOVAGINAL ANCILLARY ONLY
Bacterial Vaginitis (gardnerella): NEGATIVE
Candida Glabrata: NEGATIVE
Candida Vaginitis: NEGATIVE
Chlamydia: NEGATIVE
Comment: NEGATIVE
Comment: NEGATIVE
Comment: NEGATIVE
Comment: NEGATIVE
Comment: NEGATIVE
Comment: NORMAL
Neisseria Gonorrhea: NEGATIVE
Trichomonas: NEGATIVE

## 2019-08-05 LAB — CULTURE, BETA STREP (GROUP B ONLY): Strep Gp B Culture: NEGATIVE

## 2019-08-08 ENCOUNTER — Ambulatory Visit (INDEPENDENT_AMBULATORY_CARE_PROVIDER_SITE_OTHER): Payer: Medicaid Other | Admitting: Nurse Practitioner

## 2019-08-08 ENCOUNTER — Encounter: Payer: Self-pay | Admitting: Nurse Practitioner

## 2019-08-08 VITALS — BP 107/59 | HR 97 | Wt 210.0 lb

## 2019-08-08 DIAGNOSIS — R569 Unspecified convulsions: Secondary | ICD-10-CM

## 2019-08-08 DIAGNOSIS — Z3A37 37 weeks gestation of pregnancy: Secondary | ICD-10-CM

## 2019-08-08 DIAGNOSIS — O0993 Supervision of high risk pregnancy, unspecified, third trimester: Secondary | ICD-10-CM

## 2019-08-08 DIAGNOSIS — O0990 Supervision of high risk pregnancy, unspecified, unspecified trimester: Secondary | ICD-10-CM

## 2019-08-08 NOTE — Progress Notes (Signed)
Virtual Visit via Telephone Note  I connected with [name] on 08/08/19 at 10:15 AM EST by telephone and verified that I am speaking with the correct person using two identifiers.  ROB c/o irregular contractions > 5 mins apart, denies LOF, VB Stopped taking Procardia d/t low BP.

## 2019-08-08 NOTE — Progress Notes (Signed)
I connected with@ on 08/08/19 at 10:15 AM EST by: MyChart and verified that I am speaking with the correct person using two identifiers.  Patient is located at home and provider is located at [place].     The purpose of this virtual visit is to provide medical care while limiting exposure to the novel coronavirus. I discussed the limitations, risks, security and privacy concerns of performing an evaluation and management service by Earlie Server, NP and the availability of in person appointments. I also discussed with the patient that there may be a patient responsible charge related to this service. By engaging in this virtual visit, you consent to the provision of healthcare.  Additionally, you authorize for your insurance to be billed for the services provided during this visit.  The patient expressed understanding and agreed to proceed.  The following staff members participated in the virtual visit:  Wilhemina Bonito, CMA and Earlie Server, NP    PRENATAL VISIT NOTE  Subjective:  Melissa [name] is a 18 y.o. G1P0 at [redacted]w[redacted]d  for phone visit for ongoing prenatal care.  She is currently monitored for the following issues for this high-risk pregnancy and has Suicide attempt by drug ingestion ([place]); GAD (generalized anxiety disorder); Mild intellectual disability; Partial epilepsy with impairment of consciousness ([place]); Migraine without aura and without status migrainosus, not intractable; Episodic tension-type headache, not intractable; Acne vulgaris; Chronic constipation; Bilateral low back pain without sciatica; MDD (major depressive disorder), recurrent severe, without psychosis ([place]); DMDD (disruptive mood dysregulation disorder) ([place]); 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  Patient reports headaches.   Contractions: Irritability. Vag. Bleeding: None.  Movement: Present. Denies leaking of fluid.   The following portions of the patient's history were reviewed and updated as appropriate: allergies, current medications, past family history, past medical history, past social history, past surgical history and problem list.   Objective:   Vitals:   08/08/19 1018  BP: (!) 107/59  Pulse: 97  Weight: 210 lb (95.3 kg)   Self-Obtained  Fetal Status:     Movement: Present     Assessment and Plan:  Pregnancy: G1P0 at [redacted]w[redacted]d 1. Supervision of high risk pregnancy, antepartum Having some contractions.  Expecting labor.  Has stopped procardia.  Baby is moving well.  2.  Headache Reports awakening at night with a headache but it has resolved today.  Is not taking Tylenol.  BP is normal.  Having some edema.  No visual changes.  No RUQ pain.  3.  Seizures  Admin staff still trying to get neurology appointment scheduled prior to [facility] exam.  Called and Monday and will call again today as neurology office has not returned the call.  Client reports she has had some arm twitching and prior to that was having focal seizures.  Stopped her seizure medications herself upon learning she was pregnant.  Term labor symptoms and general obstetric precautions including but not limited to vaginal bleeding, contractions, leaking of fluid and fetal movement were reviewed in detail with the patient.  Return in about 1 week (around 08/15/2019) for Virtual visit.  Future Appointments  Date Time Provider [place]  08/11/2019 11:30 AM Hickling, Princess Bruins, MD [facility] None     Time spent on virtual visit:  10  minutes  Virginia Rochester, NP

## 2019-08-09 ENCOUNTER — Inpatient Hospital Stay (HOSPITAL_COMMUNITY)
Admission: AD | Admit: 2019-08-09 | Discharge: 2019-08-10 | Disposition: A | Discharge status: Home / Self Care | Payer: Medicaid Other | Attending: Obstetrics & Gynecology | Admitting: Obstetrics & Gynecology

## 2019-08-09 ENCOUNTER — Other Ambulatory Visit: Payer: Self-pay

## 2019-08-09 ENCOUNTER — Inpatient Hospital Stay (HOSPITAL_BASED_OUTPATIENT_CLINIC_OR_DEPARTMENT_OTHER): Payer: Medicaid Other

## 2019-08-09 ENCOUNTER — Encounter (HOSPITAL_COMMUNITY): Payer: Self-pay | Admitting: Obstetrics & Gynecology

## 2019-08-09 DIAGNOSIS — Z87820 Personal history of traumatic brain injury: Secondary | ICD-10-CM | POA: Insufficient documentation

## 2019-08-09 DIAGNOSIS — J45909 Unspecified asthma, uncomplicated: Secondary | ICD-10-CM | POA: Insufficient documentation

## 2019-08-09 DIAGNOSIS — O99513 Diseases of the respiratory system complicating pregnancy, third trimester: Secondary | ICD-10-CM | POA: Diagnosis not present

## 2019-08-09 DIAGNOSIS — O368390 Maternal care for abnormalities of the fetal heart rate or rhythm, unspecified trimester, not applicable or unspecified: Secondary | ICD-10-CM

## 2019-08-09 DIAGNOSIS — Z7951 Long term (current) use of inhaled steroids: Secondary | ICD-10-CM | POA: Insufficient documentation

## 2019-08-09 DIAGNOSIS — Z79899 Other long term (current) drug therapy: Secondary | ICD-10-CM | POA: Insufficient documentation

## 2019-08-09 DIAGNOSIS — O368930 Maternal care for other specified fetal problems, third trimester, not applicable or unspecified: Secondary | ICD-10-CM | POA: Diagnosis not present

## 2019-08-09 DIAGNOSIS — O99353 Diseases of the nervous system complicating pregnancy, third trimester: Secondary | ICD-10-CM | POA: Diagnosis not present

## 2019-08-09 DIAGNOSIS — R569 Unspecified convulsions: Secondary | ICD-10-CM | POA: Insufficient documentation

## 2019-08-09 DIAGNOSIS — O368330 Maternal care for abnormalities of the fetal heart rate or rhythm, third trimester, not applicable or unspecified: Secondary | ICD-10-CM | POA: Diagnosis not present

## 2019-08-09 DIAGNOSIS — Z3689 Encounter for other specified antenatal screening: Secondary | ICD-10-CM

## 2019-08-09 DIAGNOSIS — Z3A37 37 weeks gestation of pregnancy: Secondary | ICD-10-CM

## 2019-08-09 DIAGNOSIS — R319 Hematuria, unspecified: Secondary | ICD-10-CM | POA: Diagnosis present

## 2019-08-09 DIAGNOSIS — M7918 Myalgia, other site: Secondary | ICD-10-CM | POA: Insufficient documentation

## 2019-08-09 DIAGNOSIS — Z87891 Personal history of nicotine dependence: Secondary | ICD-10-CM | POA: Insufficient documentation

## 2019-08-09 DIAGNOSIS — O26893 Other specified pregnancy related conditions, third trimester: Secondary | ICD-10-CM | POA: Insufficient documentation

## 2019-08-09 LAB — BASIC METABOLIC PANEL
Anion gap: 10 (ref 5–15)
BUN: 9 mg/dL (ref 6–20)
CO2: 23 mmol/L (ref 22–32)
Calcium: 9.3 mg/dL (ref 8.9–10.3)
Chloride: 103 mmol/L (ref 98–111)
Creatinine, Ser: 0.65 mg/dL (ref 0.44–1.00)
GFR calc Af Amer: >60 mL/min (ref >60)
GFR calc non Af Amer: >60 mL/min (ref >60)
Glucose, Bld: 83 mg/dL (ref 70–99)
Potassium: 3.8 mmol/L (ref 3.5–5.1)
Sodium: 136 mmol/L (ref 135–145)

## 2019-08-09 LAB — CBC
HCT: 34.4 % — ABNORMAL LOW (ref 36.0–46.0)
Hemoglobin: 11.7 g/dL — ABNORMAL LOW (ref 12.0–15.0)
MCH: 29.6 pg (ref 26.0–34.0)
MCHC: 34 g/dL (ref 30.0–36.0)
MCV: 87.1 fL (ref 80.0–100.0)
Platelets: 164 10*3/uL (ref 150–400)
RBC: 3.95 MIL/uL (ref 3.87–5.11)
RDW: 14.1 % (ref 11.5–15.5)
WBC: 7.3 10*3/uL (ref 4.0–10.5)
nRBC: 0 % (ref 0.0–0.2)

## 2019-08-09 LAB — URINALYSIS, ROUTINE W REFLEX MICROSCOPIC
Bacteria, UA: NONE SEEN
Bilirubin Urine: NEGATIVE
Glucose, UA: NEGATIVE mg/dL
Ketones, ur: NEGATIVE mg/dL
Leukocytes,Ua: NEGATIVE
Nitrite: NEGATIVE
Protein, ur: 100 mg/dL — AB
RBC / HPF: >50 RBC/hpf — ABNORMAL HIGH (ref 0–5)
Specific Gravity, Urine: 1.011 (ref 1.005–1.030)
pH: 7 (ref 5.0–8.0)

## 2019-08-09 NOTE — Discharge Instructions (Signed)
Hematuria, Adult Hematuria is blood in the urine. Blood may be visible in the urine, or it may be identified with a test. This condition can be caused by infections of the bladder, urethra, kidney, or prostate. Other possible causes include:  Kidney stones.  Cancer of the urinary tract.  Too much calcium in the urine.  Conditions that are passed from parent to child (inherited conditions).  Exercise that requires a lot of energy. Infections can usually be treated with medicine, and a kidney stone usually will pass through your urine. If neither of these is the cause of your hematuria, more tests may be needed to identify the cause of your symptoms. It is very important to tell your health care provider about any blood in your urine, even if it is painless or the blood stops without treatment. Blood in the urine, when it happens and then stops and then happens again, can be a symptom of a very serious condition, including cancer. There is no pain in the initial stages of many urinary cancers. Follow these instructions at home: Medicines  Take over-the-counter and prescription medicines only as told by your health care provider.  If you were prescribed an antibiotic medicine, take it as told by your health care provider. Do not stop taking the antibiotic even if you start to feel better. Eating and drinking  Drink enough fluid to keep your urine clear or pale yellow. It is recommended that you drink 3-4 quarts (2.8-3.8 L) a day. If you have been diagnosed with an infection, it is recommended that you drink cranberry juice in addition to large amounts of water.  Avoid caffeine, tea, and carbonated beverages. These tend to irritate the bladder.  Avoid alcohol because it may irritate the prostate (men). General instructions  If you have been diagnosed with a kidney stone, follow your health care provider's instructions about straining your urine to catch the stone.  Empty your bladder  often. Avoid holding urine for long periods of time.  If you are female: ? After a bowel movement, wipe from front to back and use each piece of toilet paper only once. ? Empty your bladder before and after sex.  Pay attention to any changes in your symptoms. Tell your health care provider about any changes or any new symptoms.  It is your responsibility to get your test results. Ask your health care provider, or the department performing the test, when your results will be ready.  Keep all follow-up visits as told by your health care provider. This is important. Contact a health care provider if:  You develop back pain.  You have a fever.  You have nausea or vomiting.  Your symptoms do not improve after 3 days.  Your symptoms get worse. Get help right away if:  You develop severe vomiting and are unable take medicine without vomiting.  You develop severe pain in your back or abdomen even though you are taking medicine.  You pass a large amount of blood in your urine.  You pass blood clots in your urine.  You feel very weak or like you might faint.  You faint. Summary  Hematuria is blood in the urine. It has many possible causes.  It is very important that you tell your health care provider about any blood in your urine, even if it is painless or the blood stops without treatment.  Take over-the-counter and prescription medicines only as told by your health care provider.  Drink enough fluid to keep   your urine clear or pale yellow. This information is not intended to replace advice given to you by your health care provider. Make sure you discuss any questions you have with your health care provider. Document Released: 08/07/2005 Document Revised: 07/20/2017 Document Reviewed: 09/09/2016 Elsevier Patient Education  2020 Elsevier Inc.  

## 2019-08-09 NOTE — MAU Note (Signed)
Pt reports she thinks the rest of her mucus plug came out this morning. Was having some mild cramping but able to take a nap this afternoon. When she woke up she had some vag bleeding when wiping and had lower back and neck pain. Good fetal movement reported.

## 2019-08-10 DIAGNOSIS — Z3A37 37 weeks gestation of pregnancy: Secondary | ICD-10-CM

## 2019-08-10 DIAGNOSIS — O368330 Maternal care for abnormalities of the fetal heart rate or rhythm, third trimester, not applicable or unspecified: Secondary | ICD-10-CM

## 2019-08-10 NOTE — MAU Provider Note (Addendum)
History     CSN: [id number]  Arrival date and time: 08/09/19 2116   First Provider Initiated Contact with Patient 08/10/19 0013      Chief Complaint  Patient presents with  . Vaginal Bleeding   HPI  Melissa [name] is a 18 y.o. G1P0 at 68w2dwho presents to MAU with chief complaints of back pain and abdominal pain. These are recurrent problems, onset a few weeks ago. Patient states her current episode of pain began this morning. She woke up to void, noticed severe bilateral low back pain at her SI joint which radiated to her lower abdomen. Her pain continued throughout the day and was not associated with episodes of voiding.  Patient and her mother are both concerned that she has had "kidney problems" since birth and kidney failure runs on the patient's father's side of the family.  She denies vaginal bleeding, leaking of fluid, decreased fetal movement, fever, falls, or recent illness. She denies flank pain, dysuria, foul-smelling urine.  Patient declines pain medicine in MAU. She is accompanied by her mother.   OB History    Gravida  1   Para      Term      Preterm      AB      Living  0     SAB      TAB      Ectopic      Multiple      Live Births              Past Medical History:  Diagnosis Date  . Anxiety   . Asthma    inhaler. last attack June 2019  . Congenital hydronephrosis 2001  . Constipation   . Depression   . [place]iz[place]    last one in 2015 - on meds  . TBI (traumatic brain injury) ([place] 2006    Past Surgical History:  Procedure Laterality Date  . APPENDECTOMY    . HERNIA REPAIR    . INTESTINAL MALROTATION REPAIR  2001    Family History  Problem Relation Age of Onset  . Depression Mother   . Stroke Mother   . Obesity Mother   . Post-traumatic stress disorder Mother   . Anxiety disorder Mother   . Schizophrenia Father     Social History   Tobacco Use  . Smoking status: Former SResearch scientist (life sciences) . Smokeless tobacco:  Never Used  . Tobacco comment: black and milds, none since + UPT  Substance Use Topics  . Alcohol use: Never  . Drug use: Not Currently    Types: Marijuana    Comment: Episodic    Allergies:  Allergies  Allergen Reactions  . Benadryl [Diphenhydramine Hcl] Other (See Comments)    Causes seizures   . Gluten Meal Nausea And Vomiting  . Zithromax [Azithromycin] Anaphylaxis and Swelling  . Lactose Intolerance (Gi) Diarrhea    Medications Prior to Admission  Medication Sig Dispense Refill Last Dose  . cyclobenzaprine (FLEXERIL) 10 MG tablet Take 1 tablet (10 mg total) by mouth 3 (three) times daily as needed (headache (take with tylenol)). 30 tablet 0 Past Week at Unknown time  . famotidine (PEPCID) 20 MG tablet Take 1 tablet (20 mg total) by mouth 2 (two) times daily as needed for heartburn or indigestion. 30 tablet 1 08/08/2019 at Unknown time  . ferrous sulfate (IRON SUPPLEMENT) 325 (65 FE) MG tablet Take 325 mg by mouth daily with breakfast.   08/09/2019 at Unknown time  .  fluticasone (FLONASE) 50 MCG/ACT nasal spray Place 2 sprays into both nostrils daily. 16 g 11 08/08/2019 at Unknown time  . Prenatal Vit-Fe Fumarate-FA (PRENATAL MULTIVITAMIN) TABS tablet Take 1 tablet by mouth daily at 12 noon.   08/09/2019 at Unknown time  . albuterol (PROVENTIL) (2.5 MG/3ML) 0.083% nebulizer solution Take 3 mLs (2.5 mg total) by nebulization every 6 (six) hours as needed for wheezing or shortness of breath (IF inhaler is not strong enough). 150 mL 1   . albuterol (VENTOLIN HFA) 108 (90 Base) MCG/ACT inhaler Inhale 2 puffs into the lungs every 6 (six) hours as needed for wheezing or shortness of breath (shortness of breath from reactive airway disease). 6.7 g 1   . Blood Pressure Monitoring KIT 1 kit by Does not apply route once a week. 1 kit 0   . docusate sodium (COLACE) 100 MG capsule Take 1 capsule (100 mg total) by mouth 2 (two) times daily as needed. (Patient not taking: Reported on 08/08/2019)  30 capsule 1   . NIFEdipine (PROCARDIA) 10 MG capsule Take 1 capsule (10 mg total) by mouth every 6 (six) hours as needed (for greater than 5 contractions per hour). (Patient not taking: Reported on 08/08/2019) 30 capsule 0     Review of Systems  Constitutional: Negative for chills, fatigue and fever.  Gastrointestinal: Positive for abdominal pain.  Genitourinary: Positive for hematuria. Negative for difficulty urinating, dysuria, flank pain, vaginal bleeding, vaginal discharge and vaginal pain.  Musculoskeletal: Positive for back pain.  All other systems reviewed and are negative.  Physical Exam   Blood pressure 113/72, pulse 89, temperature 98.1 F (36.7 C), resp. rate 18, last menstrual period 11/22/2018.  Physical Exam  Nursing note and vitals reviewed. Constitutional: She is oriented to person, place, and time. She appears well-developed and well-nourished.  Cardiovascular: Normal rate.  Respiratory: Effort normal and breath sounds normal. She has no decreased breath sounds.  GI: Soft. She exhibits no distension. There is no abdominal tenderness. There is no rebound, no guarding and no CVA tenderness.  Gravid  Neurological: She is alert and oriented to person, place, and time.  Skin: Skin is warm and dry.  Psychiatric: She has a normal mood and affect. Her behavior is normal. Judgment and thought content normal.    MAU Course  Procedures  --Hematuria noted, otherwise unremarkable exam --Discussed typical presentation of back pain and abnormal lab work indicating kidney issues vs. back pain in this trimester --Hematuria noted. Pertinent negatives: dysuria, flank pain, hx UTIs or kidney stones, CVAT, fever --Reassuring fetal tracing. Baseline 140, moderate variability, positive accels, RN voiced concern for decel x 2 at 2318 and 2323 when she was at bedside repositioning patient. No other events on fetal tracing. --BPP 8/8 with annotation that cord was draped across face and  head.  Patient Vitals for the past 24 hrs:  BP Temp Pulse Resp  08/10/19 0022 113/72 -- 89 18  08/09/19 2135 106/71 98.1 F (36.7 C) 96 18   Results for orders placed or performed during the hospital encounter of 08/09/19 (from the past 24 hour(s))  Urinalysis, Routine w reflex microscopic     Status: Abnormal   Collection Time: 08/09/19  9:50 PM  Result Value Ref Range   Color, Urine YELLOW YELLOW   APPearance HAZY (A) CLEAR   Specific Gravity, Urine 1.011 1.005 - 1.030   pH 7.0 5.0 - 8.0   Glucose, UA NEGATIVE NEGATIVE mg/dL   Hgb urine dipstick LARGE (A) NEGATIVE  Bilirubin Urine NEGATIVE NEGATIVE   Ketones, ur NEGATIVE NEGATIVE mg/dL   Protein, ur 100 (A) NEGATIVE mg/dL   Nitrite NEGATIVE NEGATIVE   Leukocytes,Ua NEGATIVE NEGATIVE   RBC / HPF >50 (H) 0 - 5 RBC/hpf   WBC, UA 0-5 0 - 5 WBC/hpf   Bacteria, UA NONE SEEN NONE SEEN   Squamous Epithelial / LPF 0-5 0 - 5   Mucus PRESENT   CBC     Status: Abnormal   Collection Time: 08/09/19 10:35 PM  Result Value Ref Range   WBC 7.3 4.0 - 10.5 K/uL   RBC 3.95 3.87 - 5.11 MIL/uL   Hemoglobin 11.7 (L) 12.0 - 15.0 g/dL   HCT 34.4 (L) 36.0 - 46.0 %   MCV 87.1 80.0 - 100.0 fL   MCH 29.6 26.0 - 34.0 pg   MCHC 34.0 30.0 - 36.0 g/dL   RDW 14.1 11.5 - 15.5 %   Platelets 164 150 - 400 K/uL   nRBC 0.0 0.0 - 0.2 %  Basic metabolic panel     Status: None   Collection Time: 08/09/19 10:35 PM  Result Value Ref Range   Sodium 136 135 - 145 mmol/L   Potassium 3.8 3.5 - 5.1 mmol/L   Chloride 103 98 - 111 mmol/L   CO2 23 22 - 32 mmol/L   Glucose, Bld 83 70 - 99 mg/dL   BUN 9 6 - 20 mg/dL   Creatinine, Ser 0.65 0.44 - 1.00 mg/dL   Calcium 9.3 8.9 - 10.3 mg/dL   GFR calc non Af Amer >60 >60 mL/min   GFR calc Af Amer >60 >60 mL/min   Anion gap 10 5 - 15   Assessment and Plan  --18 y.o. G1P0 at [redacted]w[redacted]d --Hematuria x 1 episode. Normal CBC and BMET. Continue to monitor --Reactive tracing, BPP 8/8 --Musculoskeletal pain in  pregnancy --Discharge home in stable condition with labor precautions  SDarlina Rumpf CNM 08/10/2019, 7:56 AM

## 2019-08-11 ENCOUNTER — Ambulatory Visit (INDEPENDENT_AMBULATORY_CARE_PROVIDER_SITE_OTHER): Payer: Medicaid Other | Admitting: Pediatrics

## 2019-08-11 ENCOUNTER — Other Ambulatory Visit: Payer: Self-pay

## 2019-08-11 ENCOUNTER — Encounter (INDEPENDENT_AMBULATORY_CARE_PROVIDER_SITE_OTHER): Payer: Self-pay | Admitting: Pediatrics

## 2019-08-11 VITALS — BP 110/90 | HR 104 | Ht 67.0 in | Wt 208.8 lb

## 2019-08-11 DIAGNOSIS — G43009 Migraine without aura, not intractable, without status migrainosus: Secondary | ICD-10-CM | POA: Diagnosis not present

## 2019-08-11 DIAGNOSIS — O0001 Abdominal pregnancy with intrauterine pregnancy: Secondary | ICD-10-CM | POA: Insufficient documentation

## 2019-08-11 DIAGNOSIS — G43109 Migraine with aura, not intractable, without status migrainosus: Secondary | ICD-10-CM | POA: Diagnosis not present

## 2019-08-11 DIAGNOSIS — G40209 Localization-related (focal) (partial) symptomatic epilepsy and epileptic syndromes with complex partial seizures, not intractable, without status epilepticus: Secondary | ICD-10-CM | POA: Diagnosis not present

## 2019-08-11 NOTE — Patient Instructions (Signed)
Congratulations.  I am glad that your seizures are not frequent, but because they are persistent, we need to put you back on Tegretol (carbamazepine) after you deliver and after you stop breast-feeding.  I will gradually introduce carbamazepine at that time.  Please use MyChart to get up with me and to let me know how you are doing and what you decided to do about breast-feeding.  If you have questions, please use MyChart to ask them.
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
## 2019-08-12 DIAGNOSIS — G43109 Migraine with aura, not intractable, without status migrainosus: Secondary | ICD-10-CM | POA: Insufficient documentation

## 2019-08-18 ENCOUNTER — Encounter: Payer: Self-pay | Admitting: Obstetrics & Gynecology

## 2019-08-18 ENCOUNTER — Telehealth (INDEPENDENT_AMBULATORY_CARE_PROVIDER_SITE_OTHER): Payer: Medicaid Other | Admitting: Obstetrics & Gynecology

## 2019-08-18 DIAGNOSIS — O0993 Supervision of high risk pregnancy, unspecified, third trimester: Secondary | ICD-10-CM

## 2019-08-18 DIAGNOSIS — Z3A38 38 weeks gestation of pregnancy: Secondary | ICD-10-CM

## 2019-08-18 DIAGNOSIS — E639 Nutritional deficiency, unspecified: Secondary | ICD-10-CM

## 2019-08-18 DIAGNOSIS — R638 Other symptoms and signs concerning food and fluid intake: Secondary | ICD-10-CM

## 2019-08-18 DIAGNOSIS — D573 Sickle-cell trait: Secondary | ICD-10-CM

## 2019-08-18 DIAGNOSIS — O0990 Supervision of high risk pregnancy, unspecified, unspecified trimester: Secondary | ICD-10-CM

## 2019-08-18 NOTE — Patient Instructions (Signed)

## 2019-08-18 NOTE — Progress Notes (Signed)
TELEHEALTH VIRTUAL OBSTETRICS VISIT ENCOUNTER NOTE  I connected with [name] on 08/18/19 at  3:45 PM EST by telephone at home and verified that I am speaking with the correct person using two identifiers.   I discussed the limitations, risks, security and privacy concerns of performing an evaluation and management service by telephone and the availability of in person appointments. I also discussed with the patient that there may be a patient responsible charge related to this service. The patient expressed understanding and agreed to proceed.  Subjective:  Melissa [name] is a 18 y.o. G1P0 at [redacted]w[redacted]d being followed for ongoing prenatal care.  She is currently monitored for the following issues for this low-risk pregnancy and has Suicide attempt by drug ingestion ([place]); GAD (generalized anxiety disorder); Mild intellectual disability; Partial epilepsy with impairment of consciousness ([place]); Migraine without aura and without status migrainosus, not intractable; Episodic tension-type headache, not intractable; Acne vulgaris; Chronic constipation; Bilateral low back pain without sciatica; MDD (major depressive disorder), recurrent severe, without psychosis ([place]); DMDD (disruptive mood dysregulation disorder) ([place]); 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  Patient reports occasional contractions. Reports fetal movement. Denies any contractions, bleeding or leaking of fluid.   The following portions of the patient's history were reviewed and updated as appropriate: allergies, current medications, past family history, past medical history, past social history, past surgical history  and problem list.   Objective:   General:  Alert, oriented and cooperative.   Mental Status: Normal mood and affect perceived. Normal judgment and thought content.  Rest of physical exam deferred due to type of encounter  Assessment and Plan:  Pregnancy: G1P0 at [redacted]w[redacted]d 1. Inadequate oral ntritional intake   2. Sickle cell trait [facility]) Patient Active Problem List   Diagnosis Date Noted  . Migraine with aura and without status migrainosus, not intractable 08/12/2019  . Abdominal pregnancy with intrauterine pregnancy 08/11/2019  . Seizures ([place])   . Inadequate oral nutritional intake 04/27/2019  . Sickle cell trait ([place]) 02/13/2019  . Rh negative state in antepartum period 02/05/2019  . Supervision of high risk pregnancy, antepartum 02/05/2019  . 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 DMDD (disruptive mood dysregulation disorder) ([place]) 07/21/2016  . MDD (major depressive disorder), recurrent severe, without psychosis ([place]) 07/17/2016  . Bilateral low back pain without sciatica 03/08/2016  . Acne vulgaris 08/03/2015  . Chronic constipation 08/03/2015  . Partial epilepsy with impairment of consciousness ([place]) 04/16/2015  . Migraine without aura and without status migrainosus, not intractable 04/16/2015  . Episodic tension-type headache, not intractable 04/16/2015  . 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 GAD (generalized anxiety disorder) 11/25/2013  . Suicide attempt by drug ingestion ([place]) 11/18/2013     Term labor symptoms and general obstetric precautions including but not limited to vaginal bleeding, contractions, leaking of fluid and fetal movement were reviewed in detail with the patient.  I discussed the assessment and treatment plan with the patient. The patient was provided an opportunity to ask questions and all were answered. The patient agreed with the plan and demonstrated an understanding of the instructions. The patient was advised to call back or  seek an in-person office evaluation/go to MAU at [place] for  any urgent or concerning symptoms. Please refer to After Visit Summary for other counseling recommendations.   I provided 10 minutes of non-face-to-face time during this encounter.  Return in about 1 week (around 08/25/2019) for in person.  Future Appointments  Date Time Provider Department Center  12/10/2019  3:15 PM Hickling, Deanna Artis, MD [facility] None    Scheryl Darter, MD [facility]

## 2019-08-18 NOTE — Progress Notes (Signed)
Virtual Rob   CC: On/Off contractions   Pt was able to check B/P while on the phone.    B/P:99/66  P: 87

## 2019-08-19 ENCOUNTER — Inpatient Hospital Stay (HOSPITAL_COMMUNITY)
Admission: AD | Admit: 2019-08-19 | Discharge: 2019-08-22 | DRG: 806 | Disposition: A | Discharge status: Home / Self Care | Payer: Medicaid Other | Source: Ambulatory Visit | Attending: Obstetrics and Gynecology | Admitting: Obstetrics and Gynecology

## 2019-08-19 ENCOUNTER — Encounter (HOSPITAL_COMMUNITY): Payer: Self-pay | Admitting: Family Medicine

## 2019-08-19 ENCOUNTER — Other Ambulatory Visit: Payer: Self-pay

## 2019-08-19 DIAGNOSIS — Z6791 Unspecified blood type, Rh negative: Secondary | ICD-10-CM

## 2019-08-19 DIAGNOSIS — O99354 Diseases of the nervous system complicating childbirth: Secondary | ICD-10-CM | POA: Diagnosis present

## 2019-08-19 DIAGNOSIS — D573 Sickle-cell trait: Secondary | ICD-10-CM

## 2019-08-19 DIAGNOSIS — R569 Unspecified convulsions: Secondary | ICD-10-CM

## 2019-08-19 DIAGNOSIS — R638 Other symptoms and signs concerning food and fluid intake: Secondary | ICD-10-CM

## 2019-08-19 DIAGNOSIS — Z87891 Personal history of nicotine dependence: Secondary | ICD-10-CM

## 2019-08-19 DIAGNOSIS — O26893 Other specified pregnancy related conditions, third trimester: Secondary | ICD-10-CM | POA: Diagnosis present

## 2019-08-19 DIAGNOSIS — O99214 Obesity complicating childbirth: Secondary | ICD-10-CM | POA: Diagnosis present

## 2019-08-19 DIAGNOSIS — F332 Major depressive disorder, recurrent severe without psychotic features: Secondary | ICD-10-CM | POA: Diagnosis present

## 2019-08-19 DIAGNOSIS — O9952 Diseases of the respiratory system complicating childbirth: Secondary | ICD-10-CM | POA: Diagnosis present

## 2019-08-19 DIAGNOSIS — O368390 Maternal care for abnormalities of the fetal heart rate or rhythm, unspecified trimester, not applicable or unspecified: Secondary | ICD-10-CM

## 2019-08-19 DIAGNOSIS — F3160 Bipolar disorder, current episode mixed, unspecified: Secondary | ICD-10-CM | POA: Diagnosis present

## 2019-08-19 DIAGNOSIS — G40109 Localization-related (focal) (partial) symptomatic epilepsy and epileptic syndromes with simple partial seizures, not intractable, without status epilepticus: Secondary | ICD-10-CM | POA: Diagnosis present

## 2019-08-19 DIAGNOSIS — F411 Generalized anxiety disorder: Secondary | ICD-10-CM | POA: Diagnosis present

## 2019-08-19 DIAGNOSIS — G40209 Localization-related (focal) (partial) symptomatic epilepsy and epileptic syndromes with complex partial seizures, not intractable, without status epilepticus: Secondary | ICD-10-CM | POA: Diagnosis present

## 2019-08-19 DIAGNOSIS — E639 Nutritional deficiency, unspecified: Secondary | ICD-10-CM

## 2019-08-19 DIAGNOSIS — O99344 Other mental disorders complicating childbirth: Secondary | ICD-10-CM | POA: Diagnosis present

## 2019-08-19 DIAGNOSIS — O9902 Anemia complicating childbirth: Secondary | ICD-10-CM | POA: Diagnosis present

## 2019-08-19 DIAGNOSIS — O368130 Decreased fetal movements, third trimester, not applicable or unspecified: Principal | ICD-10-CM | POA: Diagnosis present

## 2019-08-19 DIAGNOSIS — D649 Anemia, unspecified: Secondary | ICD-10-CM | POA: Diagnosis present

## 2019-08-19 DIAGNOSIS — Z3A38 38 weeks gestation of pregnancy: Secondary | ICD-10-CM

## 2019-08-19 DIAGNOSIS — J45909 Unspecified asthma, uncomplicated: Secondary | ICD-10-CM | POA: Diagnosis present

## 2019-08-19 DIAGNOSIS — Z20822 Contact with and (suspected) exposure to covid-19: Secondary | ICD-10-CM | POA: Diagnosis present

## 2019-08-19 DIAGNOSIS — F70 Mild intellectual disabilities: Secondary | ICD-10-CM | POA: Diagnosis present

## 2019-08-19 HISTORY — DX: Headache, unspecified: R51.9

## 2019-08-19 HISTORY — DX: Anemia, unspecified: D64.9

## 2019-08-19 NOTE — MAU Note (Signed)
Contractions that feel back to back-started around 2100.  No LOF-just losing her mucous plug.  No current VB-had some spotting 3 days ago.  Reports not feeling the baby move as much-going on since Sunday.  Also reports chest pain that is intermittent and sharp-started on the way to the hospital.

## 2019-08-20 ENCOUNTER — Encounter (HOSPITAL_COMMUNITY): Payer: Self-pay | Admitting: Anesthesiology

## 2019-08-20 ENCOUNTER — Inpatient Hospital Stay (HOSPITAL_COMMUNITY): Payer: Medicaid Other | Admitting: Anesthesiology

## 2019-08-20 ENCOUNTER — Encounter (HOSPITAL_COMMUNITY): Payer: Self-pay | Admitting: Family Medicine

## 2019-08-20 ENCOUNTER — Other Ambulatory Visit: Payer: Self-pay

## 2019-08-20 DIAGNOSIS — Z20822 Contact with and (suspected) exposure to covid-19: Secondary | ICD-10-CM | POA: Diagnosis present

## 2019-08-20 DIAGNOSIS — F70 Mild intellectual disabilities: Secondary | ICD-10-CM | POA: Diagnosis present

## 2019-08-20 DIAGNOSIS — O9902 Anemia complicating childbirth: Secondary | ICD-10-CM | POA: Diagnosis present

## 2019-08-20 DIAGNOSIS — O368130 Decreased fetal movements, third trimester, not applicable or unspecified: Secondary | ICD-10-CM | POA: Diagnosis present

## 2019-08-20 DIAGNOSIS — O26893 Other specified pregnancy related conditions, third trimester: Secondary | ICD-10-CM | POA: Diagnosis present

## 2019-08-20 DIAGNOSIS — Z87891 Personal history of nicotine dependence: Secondary | ICD-10-CM | POA: Diagnosis not present

## 2019-08-20 DIAGNOSIS — O99214 Obesity complicating childbirth: Secondary | ICD-10-CM | POA: Diagnosis present

## 2019-08-20 DIAGNOSIS — F411 Generalized anxiety disorder: Secondary | ICD-10-CM | POA: Diagnosis present

## 2019-08-20 DIAGNOSIS — F332 Major depressive disorder, recurrent severe without psychotic features: Secondary | ICD-10-CM | POA: Diagnosis present

## 2019-08-20 DIAGNOSIS — Z6791 Unspecified blood type, Rh negative: Secondary | ICD-10-CM | POA: Diagnosis not present

## 2019-08-20 DIAGNOSIS — J45909 Unspecified asthma, uncomplicated: Secondary | ICD-10-CM | POA: Diagnosis present

## 2019-08-20 DIAGNOSIS — O9952 Diseases of the respiratory system complicating childbirth: Secondary | ICD-10-CM | POA: Diagnosis present

## 2019-08-20 DIAGNOSIS — O99344 Other mental disorders complicating childbirth: Secondary | ICD-10-CM | POA: Diagnosis present

## 2019-08-20 DIAGNOSIS — O99354 Diseases of the nervous system complicating childbirth: Secondary | ICD-10-CM | POA: Diagnosis present

## 2019-08-20 DIAGNOSIS — D649 Anemia, unspecified: Secondary | ICD-10-CM | POA: Diagnosis present

## 2019-08-20 DIAGNOSIS — Z3A38 38 weeks gestation of pregnancy: Secondary | ICD-10-CM | POA: Diagnosis not present

## 2019-08-20 DIAGNOSIS — G40109 Localization-related (focal) (partial) symptomatic epilepsy and epileptic syndromes with simple partial seizures, not intractable, without status epilepticus: Secondary | ICD-10-CM | POA: Diagnosis present

## 2019-08-20 LAB — CBC
HCT: 35.1 % — ABNORMAL LOW (ref 36.0–46.0)
Hemoglobin: 11.8 g/dL — ABNORMAL LOW (ref 12.0–15.0)
MCH: 29.6 pg (ref 26.0–34.0)
MCHC: 33.6 g/dL (ref 30.0–36.0)
MCV: 88.2 fL (ref 80.0–100.0)
Platelets: 165 10*3/uL (ref 150–400)
RBC: 3.98 MIL/uL (ref 3.87–5.11)
RDW: 14.9 % (ref 11.5–15.5)
WBC: 8.1 10*3/uL (ref 4.0–10.5)
nRBC: 0.4 % — ABNORMAL HIGH (ref 0.0–0.2)

## 2019-08-20 LAB — SARS CORONAVIRUS 2 (TAT 6-24 HRS): SARS Coronavirus 2: NEGATIVE

## 2019-08-20 LAB — RPR: RPR Ser Ql: NONREACTIVE

## 2019-08-20 MED ORDER — WITCH HAZEL-GLYCERIN EX PADS
1.0000 "application " | MEDICATED_PAD | CUTANEOUS | Status: DC | PRN
  Administered 2019-08-21: 1 via TOPICAL

## 2019-08-20 MED ORDER — LIDOCAINE HCL (PF) 1 % IJ SOLN
INTRAMUSCULAR | Status: DC | PRN
  Administered 2019-08-20: 6 mL via EPIDURAL

## 2019-08-20 MED ORDER — LACTATED RINGERS IV SOLN
500.0000 mL | Freq: Once | INTRAVENOUS | Status: AC
  Administered 2019-08-20: 500 mL via INTRAVENOUS

## 2019-08-20 MED ORDER — PHENYLEPHRINE 40 MCG/ML (10ML) SYRINGE FOR IV PUSH (FOR BLOOD PRESSURE SUPPORT): 80.0000 ug | PREFILLED_SYRINGE | INTRAVENOUS | Status: DC | PRN

## 2019-08-20 MED ORDER — OXYTOCIN 40 UNITS IN NORMAL SALINE INFUSION - SIMPLE MED
1.0000 m[IU]/min | INTRAVENOUS | Status: DC
  Administered 2019-08-20: 05:00:00 2 m[IU]/min via INTRAVENOUS
  Filled 2019-08-20: qty 1000

## 2019-08-20 MED ORDER — FENTANYL-BUPIVACAINE-NACL 0.5-0.125-0.9 MG/250ML-% EP SOLN: 12.0000 mL/h | EPIDURAL | Status: DC | PRN

## 2019-08-20 MED ORDER — FENTANYL-BUPIVACAINE-NACL 0.5-0.125-0.9 MG/250ML-% EP SOLN
12.0000 mL/h | EPIDURAL | Status: DC | PRN
  Filled 2019-08-20: qty 250

## 2019-08-20 MED ORDER — ONDANSETRON HCL 4 MG/2ML IJ SOLN: 4.0000 mg | Freq: Four times a day (QID) | INTRAMUSCULAR | Status: DC | PRN

## 2019-08-20 MED ORDER — EPHEDRINE 5 MG/ML INJ: 10.0000 mg | INTRAVENOUS | Status: DC | PRN

## 2019-08-20 MED ORDER — FLEET ENEMA 7-19 GM/118ML RE ENEM: 1.0000 | ENEMA | RECTAL | Status: DC | PRN

## 2019-08-20 MED ORDER — LACTATED RINGERS IV SOLN: 500.0000 mL | INTRAVENOUS | Status: DC | PRN

## 2019-08-20 MED ORDER — COCONUT OIL OIL: 1.0000 "application " | TOPICAL_OIL | Status: DC | PRN

## 2019-08-20 MED ORDER — PRENATAL MULTIVITAMIN CH
1.0000 | ORAL_TABLET | Freq: Every day | ORAL | Status: DC
  Administered 2019-08-21: 11:00:00 1 via ORAL
  Filled 2019-08-20: qty 1

## 2019-08-20 MED ORDER — DIBUCAINE (PERIANAL) 1 % EX OINT
1.0000 "application " | TOPICAL_OINTMENT | CUTANEOUS | Status: DC | PRN
  Administered 2019-08-21: 1 via RECTAL
  Filled 2019-08-20: qty 28

## 2019-08-20 MED ORDER — FENTANYL CITRATE (PF) 100 MCG/2ML IJ SOLN
100.0000 ug | INTRAMUSCULAR | Status: DC | PRN
  Administered 2019-08-20 (×5): 100 ug via INTRAVENOUS
  Filled 2019-08-20 (×5): qty 2

## 2019-08-20 MED ORDER — ACETAMINOPHEN 325 MG PO TABS
650.0000 mg | ORAL_TABLET | ORAL | Status: DC | PRN
  Administered 2019-08-20 – 2019-08-21 (×2): 650 mg via ORAL
  Filled 2019-08-20 (×2): qty 2

## 2019-08-20 MED ORDER — BENZOCAINE-MENTHOL 20-0.5 % EX AERO
1.0000 "application " | INHALATION_SPRAY | CUTANEOUS | Status: DC | PRN
  Administered 2019-08-20: 1 via TOPICAL
  Filled 2019-08-20: qty 56

## 2019-08-20 MED ORDER — ZOLPIDEM TARTRATE 5 MG PO TABS: 5.0000 mg | ORAL_TABLET | Freq: Every evening | ORAL | Status: DC | PRN

## 2019-08-20 MED ORDER — TERBUTALINE SULFATE 1 MG/ML IJ SOLN: 0.2500 mg | Freq: Once | INTRAMUSCULAR | Status: DC | PRN

## 2019-08-20 MED ORDER — SOD CITRATE-CITRIC ACID 500-334 MG/5ML PO SOLN: 30.0000 mL | ORAL | Status: DC | PRN

## 2019-08-20 MED ORDER — DIPHENHYDRAMINE HCL 50 MG/ML IJ SOLN: 12.5000 mg | INTRAMUSCULAR | Status: DC | PRN

## 2019-08-20 MED ORDER — ONDANSETRON HCL 4 MG/2ML IJ SOLN: 4.0000 mg | INTRAMUSCULAR | Status: DC | PRN

## 2019-08-20 MED ORDER — LACTATED RINGERS AMNIOINFUSION: INTRAVENOUS | Status: DC

## 2019-08-20 MED ORDER — ACETAMINOPHEN 325 MG PO TABS: 650.0000 mg | ORAL_TABLET | ORAL | Status: DC | PRN

## 2019-08-20 MED ORDER — LACTATED RINGERS IV SOLN: INTRAVENOUS | Status: DC

## 2019-08-20 MED ORDER — HYDROXYZINE HCL 50 MG PO TABS
50.0000 mg | ORAL_TABLET | Freq: Four times a day (QID) | ORAL | Status: DC | PRN
  Administered 2019-08-20: 13:00:00 50 mg via ORAL
  Filled 2019-08-20: qty 1

## 2019-08-20 MED ORDER — SENNOSIDES-DOCUSATE SODIUM 8.6-50 MG PO TABS
2.0000 | ORAL_TABLET | ORAL | Status: DC
  Administered 2019-08-20 – 2019-08-22 (×3): 2 via ORAL
  Filled 2019-08-20 (×3): qty 2

## 2019-08-20 MED ORDER — SIMETHICONE 80 MG PO CHEW: 80.0000 mg | CHEWABLE_TABLET | ORAL | Status: DC | PRN

## 2019-08-20 MED ORDER — TETANUS-DIPHTH-ACELL PERTUSSIS 5-2.5-18.5 LF-MCG/0.5 IM SUSP: 0.5000 mL | Freq: Once | INTRAMUSCULAR | Status: DC

## 2019-08-20 MED ORDER — LIDOCAINE HCL (PF) 1 % IJ SOLN: 30.0000 mL | INTRAMUSCULAR | Status: DC | PRN

## 2019-08-20 MED ORDER — ONDANSETRON HCL 4 MG PO TABS: 4.0000 mg | ORAL_TABLET | ORAL | Status: DC | PRN

## 2019-08-20 MED ORDER — OXYTOCIN BOLUS FROM INFUSION
500.0000 mL | Freq: Once | INTRAVENOUS | Status: AC
  Administered 2019-08-20: 16:00:00 500 mL via INTRAVENOUS

## 2019-08-20 MED ORDER — OXYTOCIN 40 UNITS IN NORMAL SALINE INFUSION - SIMPLE MED: 2.5000 [IU]/h | INTRAVENOUS | Status: DC

## 2019-08-20 MED ORDER — OXYCODONE-ACETAMINOPHEN 5-325 MG PO TABS: 1.0000 | ORAL_TABLET | ORAL | Status: DC | PRN

## 2019-08-20 MED ORDER — HYDROXYZINE HCL 50 MG PO TABS: 50.0000 mg | ORAL_TABLET | Freq: Four times a day (QID) | ORAL | Status: DC | PRN

## 2019-08-20 MED ORDER — OXYCODONE-ACETAMINOPHEN 5-325 MG PO TABS: 2.0000 | ORAL_TABLET | ORAL | Status: DC | PRN

## 2019-08-20 MED ORDER — IBUPROFEN 600 MG PO TABS
600.0000 mg | ORAL_TABLET | Freq: Four times a day (QID) | ORAL | Status: DC
  Administered 2019-08-20 – 2019-08-22 (×6): 600 mg via ORAL
  Filled 2019-08-20 (×6): qty 1

## 2019-08-20 MED ORDER — SODIUM CHLORIDE (PF) 0.9 % IJ SOLN
INTRAMUSCULAR | Status: DC | PRN
  Administered 2019-08-20: 12 mL/h via EPIDURAL

## 2019-08-20 NOTE — Discharge Instructions (Signed)

## 2019-08-20 NOTE — Progress Notes (Signed)
Patient ID: Melissa [name], female   DOB: Jul 16, 2001, 18 y.o.   MRN: [id number] Melissa [name] is a 18 y.o. G1P0 at [redacted]w[redacted]d admitted for early labor, variables  Subjective: Uncomfortable w/ uc's, wants pain meds  Objective: BP 106/66   Pulse 79   Temp 98 F (36.7 C) (Oral)   Resp 16   Ht 5\' 7"  (1.702 m)   Wt 98.7 kg   LMP 11/22/2018 (Exact Date)   SpO2 100%   BMI 34.07 kg/m  No intake/output data recorded.  FHT:  FHR: 130 bpm, variability: moderate,  accelerations:  Present,  decelerations:  Absent UC:   regular, every 2-4 minutes  SVE:   Dilation: 4.5 Effacement (%): 50 Station: -2 Exam by:: Rexford Prevo CNM  Pitocin @ 8 mu/min AROM no fluid, small amt mod MSF on glove, able to feel hair  Labs: Lab Results  Component Value Date   WBC 8.1 08/20/2019   HGB 11.8 (L) 08/20/2019   HCT 35.1 (L) 08/20/2019   MCV 88.2 08/20/2019   PLT 165 08/20/2019    Assessment / Plan: Labor, augmenting w/ pitocin, now AROM'd scant mod MSF  Labor: Progressing normally Fetal Wellbeing:  Category I Pain Control:  IV pain meds Pre-eclampsia: n/a I/D:  GBS neg Anticipated MOD:  NSVD  Melissa [name] CNM, WHNP-BC 08/20/2019, 7:58 AM

## 2019-08-20 NOTE — Anesthesia Postprocedure Evaluation (Signed)
Anesthesia Post Note  Patient: Melissa [name]  Procedure(s) Performed: AN AD [place]     Patient location during evaluation: Mother Baby Anesthesia Type: Epidural Level of consciousness: awake and alert Pain management: pain level controlled Vital Signs Assessment: post-procedure vital signs reviewed and stable Respiratory status: spontaneous breathing, nonlabored ventilation and respiratory function stable Cardiovascular status: stable Postop Assessment: no headache, no backache and epidural receding Anesthetic complications: no    Last Vitals:  Vitals:   08/20/19 1645 08/20/19 1700  BP: (!) 121/92 115/83  Pulse: 87 81  Resp: 18 18  Temp:    SpO2:      Last Pain:  Vitals:   08/20/19 1700  TempSrc:   PainSc: 0-No pain   Pain Goal: Patients Stated Pain Goal: 3 (08/20/19 1500)              Epidural/Spinal Function Cutaneous sensation: Tingles (08/20/19 1700), Patient able to flex knees: Yes (08/20/19 1700), Patient able to lift hips off bed: Yes (08/20/19 1700), Back pain beyond tenderness at insertion site: No (08/20/19 1700), Progressively worsening motor and/or sensory loss: No (08/20/19 1700), Bowel and/or bladder incontinence post epidural: No (08/20/19 1700)  [name]

## 2019-08-20 NOTE — Progress Notes (Signed)
Melissa [name] is a 18 y.o. G1P0 at [redacted]w[redacted]d admitted for early labor, variables on FHT  Subjective: Breathing through contractions, using IV fentanyl. Mom at bedside and supportive.  Objective: BP 110/62   Pulse 73   Temp 98.5 F (36.9 C) (Oral)   Resp 20   Ht 5\' 7"  (1.702 m)   Wt 98.7 kg   LMP 11/22/2018 (Exact Date)   SpO2 100%   BMI 34.07 kg/m  No intake/output data recorded.  FHT:  FHR: 125 bpm, variability: moderate,  accelerations:  Present,  decelerations:  Present variables UC:   regular, every 2-3 minutes  SVE:   Dilation: 5 Effacement (%): 80 Station: -2 Exam by:: Dr. Darene Lamer  Pitocin @ 8 mu/min  Labs: Lab Results  Component Value Date   WBC 8.1 08/20/2019   HGB 11.8 (L) 08/20/2019   HCT 35.1 (L) 08/20/2019   MCV 88.2 08/20/2019   PLT 165 08/20/2019    Assessment / Plan: 18 yo G1P0 at 38.5 EGA admitted in early labor, variables on FHT  Labor: S/p AROM, augmentation with pitocin (pit at 8). IUPC placed without difficulty, amnioinfusion started due to recurrent variables. Fetal Wellbeing:  Category I with recurrent variables. IUPC placed and amnioinfusion begun. Meconium noted on flashback from IUPC. Pain Control:  IV pain meds, considering epidural Pre-eclampsia: n/a I/D:  GBS neg Anticipated MOD:  vaginal, CS as appropriate  Merilyn Baba DO OB Fellow, Faculty Practice 08/20/2019, 10:20 AM

## 2019-08-20 NOTE — Progress Notes (Signed)
Melissa [name] is a 18 y.o. G1P0 at [redacted]w[redacted]d admitted for early labor, variables on FHT  Subjective: Comfortable with epidural. Contractions not adequate for cervical change  Objective: BP 105/60   Pulse 68   Temp 98.3 F (36.8 C) (Oral)   Resp 16   Ht 5\' 7"  (1.702 m)   Wt 98.7 kg   LMP 11/22/2018 (Exact Date)   SpO2 98%   BMI 34.07 kg/m  No intake/output data recorded.  FHT:  FHR: 120 bpm, variability: moderate,  accelerations:  Present,  decelerations:  Present occasional variable UC:   regular, every 2-3 minutes  SVE:   Dilation: 5 Effacement (%): 80 Station: -2 Exam by:: Dr. Darene Lamer  Pitocin @ 14 mu/min  Labs: Lab Results  Component Value Date   WBC 8.1 08/20/2019   HGB 11.8 (L) 08/20/2019   HCT 35.1 (L) 08/20/2019   MCV 88.2 08/20/2019   PLT 165 08/20/2019    Assessment / Plan: 18 yo G1P0 at 38.5 EGA admitted in early labor, variables on FHT  Labor:S/p AROM, augmentation with pitocin (pit at 14). IUPC placed without difficulty, amnioinfusion started due to recurrent variables-improved. Will continue to titrate pitocin until MVUs adequate Fetal Wellbeing:Category I with occasional variables. S/p IUPC placed and amnioinfusion.  Pain Control:Epidural Pre-eclampsia:n/a I/D:GBS neg Anticipated EFE:[id number], CS as appropriate  Merilyn Baba DO OB Fellow, Faculty Practice 08/20/2019, 1:06 PM

## 2019-08-20 NOTE — Anesthesia Preprocedure Evaluation (Signed)
Anesthesia Evaluation  Patient identified by MRN, date of birth, ID band Patient awake    Reviewed: Allergy & Precautions, H&P , NPO status , Patient's Chart, lab work & pertinent test results, reviewed documented beta blocker date and time   Airway Mallampati: II  TM Distance: >3 FB Neck ROM: full    Dental no notable dental hx. (+) Teeth Intact, Dental Advisory Given   Pulmonary asthma , former smoker,    Pulmonary exam normal breath sounds clear to auscultation       Cardiovascular negative cardio ROS Normal cardiovascular exam Rhythm:regular Rate:Normal     Neuro/Psych  Headaches, Seizures -,  PSYCHIATRIC DISORDERS Anxiety Depression Appears mentally challenged secondary to TBI   GI/Hepatic negative GI ROS, Neg liver ROS,   Endo/Other  Morbid obesity  Renal/GU negative Renal ROS  negative genitourinary   Musculoskeletal   Abdominal (+) + obese,   Peds  Hematology  (+) Blood dyscrasia, anemia ,   Anesthesia Other Findings   Reproductive/Obstetrics (+) Pregnancy                             Anesthesia Physical Anesthesia Plan  ASA: III  Anesthesia Plan: Epidural   Post-op Pain Management:    Induction:   PONV Risk Score and Plan:   Airway Management Planned:   Additional Equipment:   Intra-op Plan:   Post-operative Plan:   Informed Consent: I have reviewed the patients History and Physical, chart, labs and discussed the procedure including the risks, benefits and alternatives for the proposed anesthesia with the patient or authorized representative who has indicated his/her understanding and acceptance.     Dental Advisory Given  Plan Discussed with: Anesthesiologist and CRNA  Anesthesia Plan Comments: (Labs checked- platelets confirmed with RN in room. Fetal heart tracing, per RN, reported to be stable enough for sitting procedure. Discussed epidural, and patient  consents to the procedure:  included risk of possible headache,backache, failed block, allergic reaction, and nerve injury. This patient was asked if she had any questions or concerns before the procedure started.)        Anesthesia Quick Evaluation

## 2019-08-20 NOTE — Discharge Summary (Signed)
Postpartum Discharge Summary     Patient Name: Melissa [name] DOB: 2000/09/13 MRN: [id number]  Date of admission: 08/19/2019 Delivering Provider: Merilyn Baba   Date of discharge: 08/22/2019  Admitting diagnosis: Normal labor [O80, Z37.9] Intrauterine pregnancy: [redacted]w[redacted]d    Secondary diagnosis:  Active Problems:   GAD (generalized anxiety disorder)   Mild intellectual disability   Partial epilepsy with impairment of consciousness ([place]   MDD (major depressive disorder), recurrent severe, without psychosis ([place]   Seizures ([place]   Normal labor  Additional problems: Anxiety/Depression, Epilepsy, H/o brain injury     Discharge diagnosis: Term Pregnancy Delivered                                                                                                Post partum procedures:None  Augmentation: AROM and Pitocin  Complications: None  Hospital course:  Onset of Labor With Vaginal Delivery     18y.o. yo G1P0 at 348w5das admitted in Latent Labor on 08/19/2019. Patient had an uncomplicated labor course as follows:   Labor was augmented with Pitocin. She was AROMed and progressed to complete.  Membrane Rupture Time/Date: 7:52 AM ,08/20/2019   Intrapartum Procedures: Episiotomy: None [1]                                         Lacerations:  Periurethral [8];Labial [10]  Patient had a delivery of a Viable infant. 08/20/2019  Information for the patient's newborn:  KeKem KaysGirl BrKinberly0[[id number]]     Pateint had an uncomplicated postpartum course. She has f/u appt with Neuro to restart epilepsy medication. SW met with patient without barriers to discharge. She is ambulating, tolerating a regular diet, passing flatus, and urinating well. Patient is discharged home in stable condition on 08/22/19.  Delivery time: 4:14 PM    Magnesium Sulfate received: No BMZ received: No Rhophylac:No; baby O neg MMR:N/A Transfusion:No  Physical exam  Vitals:    08/21/19 0529 08/21/19 0846 08/21/19 2100 08/22/19 0604  BP: 111/68 100/69 102/72 106/67  Pulse: 98 90 100 94  Resp: '18  18 18  ' Temp: 97.7 F (36.5 C) 97.9 F (36.6 C) 98.8 F (37.1 C) 98.5 F (36.9 C)  TempSrc: Oral Oral Oral Oral  SpO2: 100% 99% 99% 100%  Weight:      Height:       General: alert, cooperative and no distress Lochia: appropriate Uterine Fundus: firm DVT Evaluation: No evidence of DVT seen on physical exam. Labs: Lab Results  Component Value Date   WBC 8.1 08/20/2019   HGB 11.8 (L) 08/20/2019   HCT 35.1 (L) 08/20/2019   MCV 88.2 08/20/2019   PLT 165 08/20/2019   CMP Latest Ref Rng & Units 08/09/2019  Glucose 70 - 99 mg/dL 83  BUN 6 - 20 mg/dL 9  Creatinine 0.44 - 1.00 mg/dL 0.65  Sodium 135 - 145 mmol/L 136  Potassium 3.5 - 5.1 mmol/L 3.8  Chloride 98 - 111  mmol/L 103  CO2 22 - 32 mmol/L 23  Calcium 8.9 - 10.3 mg/dL 9.3  Total Protein 6.5 - 8.1 g/dL -  Total Bilirubin 0.3 - 1.2 mg/dL -  Alkaline Phos 38 - 126 U/L -  AST 15 - 41 U/L -  ALT 0 - 44 U/L -    Discharge instruction: per After Visit Summary and "Baby and Me Booklet".  After visit meds:  Allergies as of 08/22/2019      Reactions   Benadryl [diphenhydramine Hcl] Other (See Comments)   Causes seizures   Gluten Meal Nausea And Vomiting   Zithromax [azithromycin] Anaphylaxis, Swelling   Lactose Intolerance (gi) Diarrhea      Medication List    TAKE these medications   acetaminophen 325 MG tablet Commonly known as: Tylenol Take 2 tablets (650 mg total) by mouth every 6 (six) hours as needed (for pain scale < 4). What changed: reasons to take this   albuterol (2.5 MG/3ML) 0.083% nebulizer solution Commonly known as: PROVENTIL Take 3 mLs (2.5 mg total) by nebulization every 6 (six) hours as needed for wheezing or shortness of breath (IF inhaler is not strong enough).   albuterol 108 (90 Base) MCG/ACT inhaler Commonly known as: VENTOLIN HFA Inhale 2 puffs into the lungs every 6  (six) hours as needed for wheezing or shortness of breath (shortness of breath from reactive airway disease).   cyclobenzaprine 10 MG tablet Commonly known as: FLEXERIL Take 10 mg by mouth 3 (three) times daily as needed for muscle spasms.   famotidine 20 MG tablet Commonly known as: Pepcid Take 1 tablet (20 mg total) by mouth 2 (two) times daily as needed for heartburn or indigestion.   fluticasone 50 MCG/ACT nasal spray Commonly known as: FLONASE Place 2 sprays into both nostrils daily.   ibuprofen 600 MG tablet Commonly known as: ADVIL Take 1 tablet (600 mg total) by mouth every 6 (six) hours.   Iron Supplement 325 (65 FE) MG tablet Generic drug: ferrous sulfate Take 325 mg by mouth daily with breakfast.   prenatal multivitamin Tabs tablet Take 1 tablet by mouth daily at 12 noon.       Diet: routine diet  Activity: Advance as tolerated. Pelvic rest for 6 weeks.   Outpatient follow up:4 weeks Follow up Appt: Future Appointments  Date Time Provider [place]  09/17/2019  1:30 PM Lajean Manes, CNM [facility] None  12/10/2019  3:15 PM Hickling, Princess Bruins, MD [facility] None   Follow up Visit: Please schedule this patient for Postpartum visit in: 4 weeks with the following provider: MD For C/S patients schedule nurse incision check in weeks 2 weeks: no High risk pregnancy complicated by: epilepsy, h/o brain injury, dep/anxiety Delivery mode:  SVD Anticipated Birth Control:  IUD at postpartum visit (considering [place]. [place]) PP Procedures needed: IUD insertion  Schedule Integrated [place] visit: yes  Newborn Data: Live born female  Birth Weight:  3249g APGAR: 41, 9  Newborn Delivery   Birth date/time: 08/20/2019 16:14:00 Delivery type: Vaginal, Spontaneous      Baby Feeding: Bottle and Breast Disposition:home with mother   08/22/2019 Chauncey Mann, MD

## 2019-08-20 NOTE — Lactation Note (Signed)
This note was copied from a baby's chart. Lactation Consultation Note  Patient Name: Melissa [name] EAVWU'[id number] Date: 08/20/2019 Reason for consult: Initial assessment;Early term 37-38.6wks;Other (Comment)(mom and grandmother desire to bottlefeed breastmilk in addition to formula)  Mom and maternal grandmother in room when [facility] entered.  Grandmother tells [place] she would like to have the pump set up for mom when [facility] inquires about moms goal/desires for feeding her infant.  Mom says she will go to school and work so infant will have to take a bottle.  She has given infant formula.  She does not wish to put infant to breast, but does want to give baby some breast milk.    LC offers to work with mom to hand express, then set pump up for mom.  Grandmother excuses herself and says she will be back.  Prior to leaving the room, LC did provided family with lactation brochure and informed them of OP services, phone line and support groups.  Hand exp. Taught.  Mom returned demo. And several drops of colostrum collected in container.  LC encouraged mom to feed to infant when infant wakes to feed again.    LC reviewed pump parts, set up, milk storage, and cleaning.  Mom asks to pump now.  LC started with 27 flange; noticed rubbing, and increased to 30 size.  Mom feels more comfort with 30.    Warm soapy water filled for washing parts.  LC encourage mom to call back if needed for review of pumping, parts, cleaning, ect. So staff can review with grandmother as well.  Infant sleeping in bassinet.  After 10 minutes of pumping, while LC reviewed frequency of pumping, 8 times in 24 hours, mom told LC she needed to use the restroom.  [place] left room to notify MB staff.    LC encouraged mom to call out for assistance feeding EBM back to infant.    Maternal Data Has patient been taught Hand Expression?: Yes Does the patient have breastfeeding experience prior to this delivery?: No  Feeding Feeding Type:  Bottle Fed - Formula Nipple Type: Slow - flow  LATCH Score                   Interventions Interventions: DEBP  Lactation Tools Discussed/Used Tools: Pump WIC Program: Yes Pump Review: Setup, frequency, and cleaning;Milk Storage(grandmother left prior to pump setup; so LC encouraged mom to call for questions) Initiated by:: Rhea Bleacher Date initiated:: 08/21/19   Consult Status Consult Status: Follow-up Date: 08/21/19 Follow-up type: In-patient    [place] 08/20/2019, 9:30 PM

## 2019-08-20 NOTE — H&P (Addendum)
Melissa [name] is a 18 y.o. G1P0 female at [redacted]w[redacted]d by LMP c/w 6wk u/s, presenting for contractions. Have been irregular for past few days, more regular and painful tonight since about 2100. Cx 3.5/50/-2 on arrival, now 4/50/-2 w/ variables noted on efm. Sharp chest pain x 1 with contraction on way here, none since, no sob/dizziness, n/v, other sx. EKG here normal. Has cardiologist she sees for heart murmur.  Reports decreased  fetal movement for past few days- feels lots of fm while on efm here, contractions: regular, vaginal bleeding: none, membranes: intact.  Initiated prenatal care at [facility] at 10 wks.   Most recent u/s bpp 12/19 [facility] visit d/t variables, u/s report states cord draped around face/head, bpp 8/8  This pregnancy complicated by: -SCT -Epilepsy d/t head injury when 18yo, off tegretol since beginning of pregnancy, neuro plans to restart tegretol after delivery/finished breastfeeding, last saw Dr.  [name] 08/11/19 d/t episodes of staring off/?seizure activity- last observed about 2-3wks ago (stares off) -Dep/anx- no meds, h/o OD on tegretol -Asthma- inhaler, prn neb   Prenatal History/Complications:  G1  Past Medical History: Past Medical History:  Diagnosis Date  . Anemia   . Anxiety   . Asthma    inhaler. last attack June 2019  . Congenital hydronephrosis 2001  . Constipation   . Depression   . Headache    migraines  . Seizures ([place])    last one in 2015 - on meds  . TBI (traumatic brain injury) ([place]) 2006    Past Surgical History: Past Surgical History:  Procedure Laterality Date  . APPENDECTOMY    . HERNIA REPAIR    . INTESTINAL MALROTATION REPAIR  2001    Obstetrical History: OB History    Gravida  1   Para      Term      Preterm      AB      Living  0     SAB      TAB      Ectopic      Multiple      Live Births              Social History: Social History   Socioeconomic History  . Marital status: Unknown    Spouse  name: Not on file  . Number of children: Not on file  . Years of education: Not on file  . Highest education level: Not on file  Occupational History  . Not on file  Tobacco Use  . Smoking status: Former Research scientist (life sciences)  . Smokeless tobacco: Never Used  . Tobacco comment: black and milds, none since + UPT  Substance and Sexual Activity  . Alcohol use: Never  . Drug use: Not Currently    Types: Marijuana    Comment: Episodic  . Sexual activity: Yes    Birth control/protection: None  Other Topics Concern  . 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   She attended [employer].    She lives with her parents, siblings, and grandfather.    She enjoys writing, singing, dancing, cooking, and shopping.   She will attend [employer] in the fall.   Social Determinants of Health   Financial Resource Strain:   . Difficulty of Paying Living Expenses: Not on file  Food Insecurity:   . Worried About Charity fundraiser in the Last Year: Not on file  . Ran Out of Food  in the Last Year: Not on file  Transportation Needs:   . Lack of Transportation (Medical): Not on file  . Lack of Transportation (Non-Medical): Not on file  Physical Activity:   . Days of Exercise per Week: Not on file  . Minutes of Exercise per Session: Not on file  Stress:   . Feeling of Stress : Not on file  Social Connections:   . Frequency of Communication with Friends and Family: Not on file  . Frequency of Social Gatherings with Friends and Family: Not on file  . Attends Religious Services: Not on file  . Active Member of Clubs or Organizations: Not on file  . Attends Banker Meetings: Not on file  . Marital Status: Not on file    Family History: Family History  Problem Relation Age of Onset  . Depression Mother   . Stroke Mother   . Obesity Mother   . Post-traumatic stress disorder Mother   . Anxiety disorder Mother   . Schizophrenia Father      Allergies: Allergies  Allergen Reactions  . Benadryl [Diphenhydramine Hcl] Other (See Comments)    Causes seizures   . Gluten Meal Nausea And Vomiting  . Zithromax [Azithromycin] Anaphylaxis and Swelling  . Lactose Intolerance (Gi) Diarrhea    Medications Prior to Admission  Medication Sig Dispense Refill Last Dose  . acetaminophen (TYLENOL) 325 MG tablet Take 650 mg by mouth every 6 (six) hours as needed.   08/19/2019 at Unknown time  . albuterol (VENTOLIN HFA) 108 (90 Base) MCG/ACT inhaler Inhale 2 puffs into the lungs every 6 (six) hours as needed for wheezing or shortness of breath (shortness of breath from reactive airway disease). 6.7 g 1 08/18/2019 at Unknown time  . cyclobenzaprine (FLEXERIL) 10 MG tablet Take 10 mg by mouth 3 (three) times daily as needed for muscle spasms.   08/19/2019 at Unknown time  . ferrous sulfate (IRON SUPPLEMENT) 325 (65 FE) MG tablet Take 325 mg by mouth daily with breakfast.   08/19/2019 at Unknown time  . Prenatal Vit-Fe Fumarate-FA (PRENATAL MULTIVITAMIN) TABS tablet Take 1 tablet by mouth daily at 12 noon.   08/19/2019 at Unknown time  . albuterol (PROVENTIL) (2.5 MG/3ML) 0.083% nebulizer solution Take 3 mLs (2.5 mg total) by nebulization every 6 (six) hours as needed for wheezing or shortness of breath (IF inhaler is not strong enough). 150 mL 1 More than a month at Unknown time  . famotidine (PEPCID) 20 MG tablet Take 1 tablet (20 mg total) by mouth 2 (two) times daily as needed for heartburn or indigestion. 30 tablet 1   . fluticasone (FLONASE) 50 MCG/ACT nasal spray Place 2 sprays into both nostrils daily. 16 g 11     Review of Systems  Pertinent pos/neg as indicated in HPI  Blood pressure 106/60, pulse 84, temperature 98.1 F (36.7 C), resp. rate 18, height 5\' 7"  (1.702 m), weight 98.7 kg, last menstrual period 11/22/2018, SpO2 100 %. General appearance: alert, cooperative and no distress Lungs: clear to auscultation bilaterally Heart:  regular rate and rhythm Abdomen: gravid, soft, non-tender Extremities: tr edema DTR's 2+  Fetal monitoring: FHR: 150 bpm, variability: moderate,  Accelerations: Present,  decelerations:  Present frequent variables, not all trace well, but are audible when sitting in room speaking w/ pt Uterine activity: irregular Dilation: 4 Effacement (%): 50 Station: -2 Exam by:: K. Elveta Rape,CNM Presentation: cephalic   Prenatal labs: ABO, Rh: O/Negative/-- (06/17 1354) Antibody: Positive, See Final Results (06/17 1354)  Rubella: 10.60 (06/17 1354) RPR: Non Reactive (10/07 0904)  HBsAg: Negative (06/17 1354)  HIV: Non Reactive (10/07 0904)  GBS: Negative/-- (12/11 1136)  2hr GTT: 78/78/85 No results found for this or any previous visit (from the past 24 hour(s)).   Assessment:  1360w5d SIUP  G1P0  Early labor  Variables  Cat 2 FHR  GBS Negative/-- (12/11 1136)   Epilepsy- off of tegretol during pregnancy  H/O head injury  H/O dep/anx w/ suicide attempt by OD 2015-currently off meds  Plan:  Admit to L&D  IV pain meds/epidural prn active labor  Expectant management  Anticipate NSVB   Plans to breast & bottlefeed  Contraception: thinking about postplacental IUD, discussed  Circumcision: n/a  SW consult pp d/t dep/anx, h/o suicide attempt, not currently on meds  Neuro wants to see pt to restart tegretol after delivery/finished breastfeeding  Cheral MarkerKimberly R Aesha Agrawal CNM, WHNP-BC 08/20/2019, 2:46 AM

## 2019-08-21 NOTE — Lactation Note (Signed)
This note was copied from a baby's chart. Lactation Consultation Note  Patient Name: Melissa [name] Today's Date: 08/21/2019   I returned to [place] room as I had said I would. Mom was recently given IB for pain/discomfort & said she preferred to rest at this time. I let Mom know that RN could assist with pumping, but I also briefly discussed adjusting suction strength on pump.   Matthias Hughs [facility] 08/21/2019, 11:40 AM

## 2019-08-21 NOTE — Anesthesia Postprocedure Evaluation (Signed)
Anesthesia Post Note  Patient: Melissa [name]  Procedure(s) Performed: AN AD [place]     Patient location during evaluation: Mother Baby Anesthesia Type: Epidural Level of consciousness: awake Pain management: satisfactory to patient Vital Signs Assessment: post-procedure vital signs reviewed and stable Respiratory status: spontaneous breathing Cardiovascular status: stable Anesthetic complications: no    Last Vitals:  Vitals:   08/21/19 0340 08/21/19 0529  BP: 98/61 111/68  Pulse: (!) 102 98  Resp: 18 18  Temp: (!) 36.3 C 36.5 C  SpO2: 100% 100%    Last Pain:  Vitals:   08/21/19 0804  TempSrc:   PainSc: Asleep   Pain Goal: Patients Stated Pain Goal: 3 (08/20/19 1500) Report from Mother who was present for her Labor and Delivery                [employer]

## 2019-08-21 NOTE — Clinical Social Work Maternal (Signed)
CLINICAL SOCIAL WORK MATERNAL/CHILD NOTE  Patient Details  Name: Mariena Meares MRN: [id number] Date of Birth: 06/19/2001  Date:  04/04/2019  Clinical Social Worker Initiating Note:  Hortencia Pilar, LCSW Date/Time: Initiated:  08/21/19/0900     Child's Name:  Bernell List   Biological Parents:  Mother   Need for Interpreter:  None   Reason for Referral:  Behavioral Health Concerns   Address:  [address] [place] [address]    Phone number:  [phone] (home)     Additional phone number: none   Household Members/Support Persons (HM/SP):       HM/SP Name Relationship DOB or Age  HM/SP -1  Brittany Shaune Spittle  MOB   18  HM/SP -2  Crystal Kellam  Sister  16  HM/SP -3 Ryana Kellam  sister 34  HM/SP -4 Villa Herb Brother  11  HM/SP -5 Lavena Bullion brother 30  HM/SP -6 Dareen Piano grandfather    HM/SP -7 Marciano Sequin MGM    HM/SP -8  Lawerance Sabal  MGF      Natural Supports (not living in the home):      Professional Supports: None(MOB wants to get back into Counseling with [employer] [facility] or [facility].)   Employment:  unemployeed   Type of Work:   none   Education:   high school graduate   Homebound arranged:  n/a  Surveyor, quantity Resources:  Medicaid   Other Resources:    plans to apply for [employer] per [employer] report.   Cultural/Religious Considerations Which May Impact Care:  none reported.   Strengths:  Ability to meet basic needs , Home prepared for child , Pediatrician chosen, Compliance with medical plan    Psychotropic Medications:    none at this time per MOB's report. MOB was on Tegretol in the past.      Pediatrician:    [place]  Pediatrician List:   [facility]  [facility]      Pediatrician Fax Number:    Risk Factors/Current Problems:  Intellectual Development Disorder     Cognitive State:  Able to Concentrate , Alert , Insightful    Mood/Affect:  Calm , Relaxed , Interested    CSW Assessment: CSW consulted as MOB has a history of MDD, MMD, SI and anxiety. CSW performed chart review and it is noted that MOB had THC use during her pregnancy. CSW went to speak with MOB at bedside to address further concerns and needs.   Upon entering the room CSW observed that MOB was standing over basinet looking at infant. CSW asked MOB if it was a good time for CSW to come and speak with her. MOB reported that she was about to take get her self together but it was okay for CSW to remain in the room and speak with her. CSW advised MOB of CSW's role and the reason that CSW had come to speak with her. MOB reported that she ws diagnosed with depression and her mood disorder in 2015. Per MOB, she was on medications in the past for her mental health, however MOB reported that "they stopped working". MOB expressed that she is not taking any mediations at this time. CSW inquired from [facility] on if she is in therapy currently. MOB reported that she was seeing a therapist with [facility]  however she is not seeing anyone at this time. MOB reported a desire to restart her therapy at [facility]. CSW provided MOB with information that she would need in order to call and schedule and appointment. CSW offered MOB other therapy resources in the event that Beverly Sessions is unable to see MOB. MOB reported that in 2015 and in 2019 she attempted Suicide. MOB reported that she was very depressed as she was getting bullied in school a lot. MOB informed CSW that she has had a number of [facility] admissions over the years due to her depression. CSW inquired form MOB on how she has been feeling since she have birth. MOB reported that she isn't feeling SI or HI and denies being in a domestic violence relationship at this time.   CSW inquired from [facility] on who her supports are. MOB reported that her mom and  dad are her primary supports. MOB reported that FOB wants to be involved in infants life but MOB not sure what is going on at this time. CSW advised MOB that in looking through charts, CSW observed that there is documented THC use during pregnancy. CSW was advised by MOB that she did use THC in April. MOB reported that she was out with some friends and they were also smoking. CSW asked for clarity on this. MOB expressed that she used THC right before she confirmed her pregnancy. MOB expressed that her mom mentioned to her not to use drugs or be around drugs, but MOB used THC. CSW very understanding and advised MOB of the hospital drug screen policy. CSW advised MOB that CS Would monitor CDS for infant and make report if needed. MOB reported that she understood and expressed no other questions over drug screen policy.   CSW took time to educate MOB on PPD and SIDS education. MOB was given PPD Checklist in order to keep track of feelings as they relate to PPD. MOB reported knowledge and understanding of SIDS.  MOB reported needing clothes for infant and no other needs.   CSW Plan/Description:  No Further Intervention Required/No Barriers to Discharge, Perinatal Mood and Anxiety Disorder (PMADs) Education, Sudden Infant Death Syndrome (SIDS) Education, [place], CSW Will Continue to Monitor Umbilical Cord Tissue Drug Screen Results and Make Report if Mardene Sayer, [place] 03/10/2019, 9:35 AM

## 2019-08-21 NOTE — Progress Notes (Signed)
CSW went to see if MOB's mom had return to bedside at this time. CSW spoke with MOB and MOB reports that her mom hasn't been back yet and that she would try and call her. CSW advised MOB that it wasn't an urgent matter for CSW to speak with MGM, however CSW wanted to confirm supports for MOB and infant. MOB reported understanding.   While in room, MOB reported that she was hungry, and asked for ice cream. CSW provided MOB with strawberry popsicle after confirming with staff that it was okay for MOB to have this. CSW also assisted MOB in ordering lunch as MOB reported that she didn't know that she could order food. CSW advised MOB that MOB gets three meals and two snacks and can order them when she is ready to eat. MOB understanding and reported "I have a gluten allergy and am lactose intolerant . CSW updated dinning services of this to ensure that meals are gluten free for MOB.   MOB expressed no needs at this time. CSW willcheck back later to see if MOB's mother has returned to bedside.      Maral Lampe S. Terrilyn Tyner, MSW, LCSW [facility] [phone]   

## 2019-08-21 NOTE — Addendum Note (Signed)
Addendum  created 08/21/19 7353 by Asher Muir, CRNA   Clinical Note Signed

## 2019-08-21 NOTE — Progress Notes (Addendum)
Post Partum Day 1 Subjective: Patient reports feeling well. She is tolerating PO. Ambulating and urinating without difficulty. Lochia minimal. Reports feeling tired but denies other concerns. Talked with patient's mother at bedside as well. Concerned about anemia due to hx but reports bleeding has been okay.   Objective: Blood pressure 111/68, pulse 98, temperature 97.7 F (36.5 C), temperature source Oral, resp. rate 18, height 5\' 7"  (1.702 m), weight 98.7 kg, last menstrual period 11/22/2018, SpO2 100 %, unknown if currently breastfeeding.  Physical Exam:  General: alert, cooperative, appears stated age and no distress Lochia: appropriate Uterine Fundus: firm DVT Evaluation: No evidence of DVT seen on physical exam.  Recent Labs    08/20/19 0300  HGB 11.8*  HCT 35.1*    Assessment/Plan: Plan for discharge tomorrow  SW consult due to hx of depression/anxiety/hx of SI Hx of Epilepsy - has appt scheduled with Neuro and plans to restart Tegretol after one month of breastfeeding Plans for IUD outpatient Mom Rh neg, Baby O neg; Rhogam not indicated CBC cancelled due to minimal EBL and appropriate lochia    LOS: 1 day   Melissa [name] N Melissa [name] 08/21/2019, 5:36 AM

## 2019-08-21 NOTE — Lactation Note (Signed)
This note was copied from a baby's chart. Lactation Consultation Note  Patient Name: Melissa [name] [id number] Date: 08/21/2019   Mom wants to provide EBM w/a bottle. She has not pumped since yesterday. I explained about stimulating milk production whenever infant receives formula. Mom is willing to pump for the 1st time today, but is currently doing skin-to-skin with her infant s/p bath. LC will return to assist with pumping (maternal request).   Matthias Hughs [facility] 08/21/2019, 11:19 AM

## 2019-08-22 MED ORDER — IBUPROFEN 600 MG PO TABS: 600.0000 mg | ORAL_TABLET | Freq: Four times a day (QID) | ORAL | 0 refills | Status: DC

## 2019-08-22 MED ORDER — ACETAMINOPHEN 325 MG PO TABS: 650.0000 mg | ORAL_TABLET | Freq: Four times a day (QID) | ORAL | 0 refills | Status: DC | PRN

## 2019-08-22 MED ORDER — POLYETHYLENE GLYCOL 3350 17 G PO PACK
17.0000 g | PACK | Freq: Every day | ORAL | Status: DC
  Administered 2019-08-22: 12:00:00 17 g via ORAL
  Filled 2019-08-22: qty 1

## 2019-08-22 NOTE — Lactation Note (Signed)
This note was copied from a baby's chart. Lactation Consultation Note  Patient Name: Girl Neah Sporrer [id number] Date: 08/22/2019 Reason for consult: Follow-up assessment  P1 mother whose infant is now 21 hours old.  This is an ETI at 38+5 weeks.  Mother's feeding preference is breast/bottle.  Currently she only desires to pump and bottle feed but also wishes to give formula.  Baby was asleep in the bassinet when I arrived.  Upon chart review I noticed that mother has been using all formula since [facility] visit yesterday.  Mother has only pumped one time.  Questioned mother's goals so I could appropriately assist her and she stated that she is planning to still "try" to pump.  Further education completed on milk coming to volume, breast stimulation and frequency of pumping.  I encouraged her to pump every 3 hours at a minimum for 15-20 minutes.  Mother verbalized understanding.   Discussed increasing volume amounts for feedings.  Engorgement prevention/treatment reviewed.  Manual pump provided.  Also suggested coconut oil for comfort.  Mother did not wish to review the manual pump or set up.  She has our OP phone number for questions after discharge.  Mother has a DEBP for home use.  No support person present at this time.   Maternal Data    Feeding Feeding Type: Bottle Fed - Formula Nipple Type: Slow - flow  LATCH Score                   Interventions    Lactation Tools Discussed/Used     Consult Status Consult Status: Complete Date: 08/23/19 Follow-up type: In-patient    Elleigh Cassetta R Tanyika Barros 08/22/2019, 9:04 AM

## 2019-08-23 LAB — BPAM RBC
Blood Product Expiration Date: 202102012359
Blood Product Expiration Date: 202102012359
Unit Type and Rh: 9500
Unit Type and Rh: 9500

## 2019-08-23 LAB — TYPE AND SCREEN
ABO/RH(D): O NEG
Antibody Screen: POSITIVE
Unit division: 0
Unit division: 0

## 2019-09-17 ENCOUNTER — Ambulatory Visit: Payer: Medicaid Other | Admitting: Certified Nurse Midwife

## 2019-10-14 ENCOUNTER — Encounter: Payer: Self-pay | Admitting: Certified Nurse Midwife

## 2019-10-25 ENCOUNTER — Encounter (HOSPITAL_COMMUNITY): Payer: Self-pay | Admitting: Emergency Medicine

## 2019-10-25 ENCOUNTER — Other Ambulatory Visit: Payer: Self-pay

## 2019-10-25 ENCOUNTER — Emergency Department (HOSPITAL_COMMUNITY)
Admission: EM | Admit: 2019-10-25 | Discharge: 2019-10-25 | Disposition: A | Discharge status: Home / Self Care | Payer: Medicaid Other | Attending: Emergency Medicine | Admitting: Emergency Medicine

## 2019-10-25 DIAGNOSIS — D573 Sickle-cell trait: Secondary | ICD-10-CM | POA: Insufficient documentation

## 2019-10-25 DIAGNOSIS — J45909 Unspecified asthma, uncomplicated: Secondary | ICD-10-CM | POA: Diagnosis not present

## 2019-10-25 DIAGNOSIS — Z87891 Personal history of nicotine dependence: Secondary | ICD-10-CM | POA: Diagnosis not present

## 2019-10-25 DIAGNOSIS — R1084 Generalized abdominal pain: Secondary | ICD-10-CM | POA: Diagnosis present

## 2019-10-25 DIAGNOSIS — Z79899 Other long term (current) drug therapy: Secondary | ICD-10-CM | POA: Diagnosis not present

## 2019-10-25 DIAGNOSIS — N898 Other specified noninflammatory disorders of vagina: Secondary | ICD-10-CM | POA: Insufficient documentation

## 2019-10-25 DIAGNOSIS — R1012 Left upper quadrant pain: Secondary | ICD-10-CM | POA: Insufficient documentation

## 2019-10-25 DIAGNOSIS — R1013 Epigastric pain: Secondary | ICD-10-CM | POA: Diagnosis not present

## 2019-10-25 DIAGNOSIS — R109 Unspecified abdominal pain: Secondary | ICD-10-CM

## 2019-10-25 DIAGNOSIS — F70 Mild intellectual disabilities: Secondary | ICD-10-CM | POA: Diagnosis not present

## 2019-10-25 LAB — BASIC METABOLIC PANEL
Anion gap: 6 (ref 5–15)
BUN: 5 mg/dL — ABNORMAL LOW (ref 6–20)
CO2: 28 mmol/L (ref 22–32)
Calcium: 9.5 mg/dL (ref 8.9–10.3)
Chloride: 105 mmol/L (ref 98–111)
Creatinine, Ser: 0.8 mg/dL (ref 0.44–1.00)
GFR calc Af Amer: >60 mL/min (ref >60)
GFR calc non Af Amer: >60 mL/min (ref >60)
Glucose, Bld: 94 mg/dL (ref 70–99)
Potassium: 3.7 mmol/L (ref 3.5–5.1)
Sodium: 139 mmol/L (ref 135–145)

## 2019-10-25 LAB — HEPATIC FUNCTION PANEL
ALT: 17 U/L (ref 0–44)
AST: 26 U/L (ref 15–41)
Albumin: 3.6 g/dL (ref 3.5–5.0)
Alkaline Phosphatase: 66 U/L (ref 38–126)
Bilirubin, Direct: 0.2 mg/dL (ref 0.0–0.2)
Indirect Bilirubin: 0.5 mg/dL (ref 0.3–0.9)
Total Bilirubin: 0.7 mg/dL (ref 0.3–1.2)
Total Protein: 6.8 g/dL (ref 6.5–8.1)

## 2019-10-25 LAB — URINALYSIS, ROUTINE W REFLEX MICROSCOPIC
Bilirubin Urine: NEGATIVE
Glucose, UA: NEGATIVE mg/dL
Hgb urine dipstick: NEGATIVE
Ketones, ur: NEGATIVE mg/dL
Leukocytes,Ua: NEGATIVE
Nitrite: NEGATIVE
Protein, ur: NEGATIVE mg/dL
Specific Gravity, Urine: 1.005 (ref 1.005–1.030)
pH: 7 (ref 5.0–8.0)

## 2019-10-25 LAB — I-STAT BETA HCG BLOOD, ED (MC, WL, AP ONLY): I-stat hCG, quantitative: <5 m[IU]/mL (ref <5)

## 2019-10-25 LAB — CBC
HCT: 39 % (ref 36.0–46.0)
Hemoglobin: 12.8 g/dL (ref 12.0–15.0)
MCH: 29 pg (ref 26.0–34.0)
MCHC: 32.8 g/dL (ref 30.0–36.0)
MCV: 88.2 fL (ref 80.0–100.0)
Platelets: 227 10*3/uL (ref 150–400)
RBC: 4.42 MIL/uL (ref 3.87–5.11)
RDW: 13.7 % (ref 11.5–15.5)
WBC: 6.1 10*3/uL (ref 4.0–10.5)
nRBC: 0 % (ref 0.0–0.2)

## 2019-10-25 LAB — LIPASE, BLOOD: Lipase: 29 U/L (ref 11–51)

## 2019-10-25 MED ORDER — ALUM & MAG HYDROXIDE-SIMETH 200-200-20 MG/5ML PO SUSP: 30.0000 mL | Freq: Once | ORAL | Status: DC

## 2019-10-25 MED ORDER — DICYCLOMINE HCL 10 MG PO CAPS: 10.0000 mg | ORAL_CAPSULE | Freq: Once | ORAL | Status: DC

## 2019-10-25 NOTE — ED Triage Notes (Signed)
Patient reports lower abdominal pain that began 2 days ago, she reports urinary frequency as well as lower back pain. States pain began on the R and is now all the way across lower abdomen, hx of appendectomy in the past. Denies n/v/d/

## 2019-10-25 NOTE — ED Notes (Signed)
Pelvic cart at bedside. 

## 2019-10-25 NOTE — ED Notes (Signed)
This nurse to pt room, noted that pt was not in room, not able to find patient on the unit, appears pt eloped prior to pelvic exam- this nurse notified EDP of pt leaving .

## 2019-10-25 NOTE — ED Provider Notes (Signed)
[facility] EMERGENCY DEPARTMENT Provider Note  CSN: [id number] Arrival date & time: 10/25/19  1401     History Chief Complaint  Patient presents with  . Abdominal Pain   Melissa [name] is a 19 y.o. female.  The history is provided by the patient.  Abdominal Pain Pain location:  Generalized Pain quality: cramping and sharp   Pain radiates to:  Does not radiate Pain severity:  Moderate Onset quality:  Gradual Duration:  2 days Timing:  Intermittent Progression:  Waxing and waning Chronicity:  New Context: recent sexual activity   Context: not retching and not trauma   Relieved by:  Nothing Worsened by:  Deep breathing Associated symptoms: constipation, nausea and vaginal discharge   Associated symptoms: no chest pain, no cough, no diarrhea, no dysuria, no fever, no hematemesis, no hematochezia, no hematuria, no shortness of breath, no vaginal bleeding and no vomiting   Risk factors: multiple surgeries   Risk factors: not pregnant       Past Medical History:  Diagnosis Date  . Anemia   . Anxiety   . Asthma    inhaler. last attack June 2019  . Congenital hydronephrosis 2001  . Constipation   . Depression   . Headache    migraines  . Seizures ([place])    last one in 2015 - on meds  . TBI (traumatic brain injury) [facility]) 2006    Patient Active Problem List   Diagnosis Date Noted  . Normal labor 08/20/2019  . Migraine with aura and without status migrainosus, not intractable 08/12/2019  . Seizures ([place])   . Inadequate oral nutritional intake 04/27/2019  . Sickle cell trait ([place]) 02/13/2019  . Rh negative state in antepartum period 02/05/2019  . Supervision of high risk pregnancy, antepartum 02/05/2019  . 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 DMDD (disruptive mood dysregulation disorder) ([place]) 07/21/2016  . 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 Bilateral low back pain  without sciatica 03/08/2016  . Acne vulgaris 08/03/2015  . Chronic constipation 08/03/2015  . Partial epilepsy with impairment of consciousness ([place]) 04/16/2015  . Migraine without aura and without status migrainosus, not intractable 04/16/2015  . Episodic tension-type headache, not intractable 04/16/2015  . 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 GAD (generalized anxiety disorder) 11/25/2013  . Suicide attempt by drug ingestion ([place]) 11/18/2013    Past Surgical History:  Procedure Laterality Date  . APPENDECTOMY    . HERNIA REPAIR    . INTESTINAL MALROTATION REPAIR  2001     OB History    Gravida  1   Para  1   Term  1   Preterm      AB      Living  1     SAB      TAB      Ectopic      Multiple  0   Live Births  1           Family History  Problem Relation Age of Onset  . Depression Mother   . Stroke Mother   . Obesity Mother   . Post-traumatic stress disorder Mother   . Anxiety disorder Mother   . Schizophrenia Father     Social History   Tobacco Use  . Smoking status: Former Research scientist (life sciences)  . Smokeless tobacco: Never Used  . Tobacco comment: black and milds, none since + UPT  Substance Use Topics  . Alcohol use:  Never  . Drug use: Not Currently    Types: Marijuana    Comment: Episodic    Home Medications Prior to Admission medications   Medication Sig Start Date End Date Taking? Authorizing Provider  albuterol (VENTOLIN HFA) 108 (90 Base) MCG/ACT inhaler Inhale 2 puffs into the lungs every 6 (six) hours as needed for wheezing or shortness of breath (shortness of breath from reactive airway disease). 03/15/19  Yes Arvilla Market, DO  ferrous sulfate (IRON SUPPLEMENT) 325 (65 FE) MG tablet Take 325 mg by mouth daily with breakfast.   Yes [provider]  ibuprofen (ADVIL) 600 MG tablet Take 1 tablet (600 mg total) by mouth every 6 (six) hours. Patient taking differently: Take 600 mg by mouth every 6 (six) hours as needed  (pain).  08/22/19  Yes Fair, Hoyle Sauer, MD  acetaminophen (TYLENOL) 325 MG tablet Take 2 tablets (650 mg total) by mouth every 6 (six) hours as needed (for pain scale < 4). Patient not taking: Reported on 10/25/2019 08/22/19   Joselyn Arrow, MD  albuterol (PROVENTIL) (2.5 MG/3ML) 0.083% nebulizer solution Take 3 mLs (2.5 mg total) by nebulization every 6 (six) hours as needed for wheezing or shortness of breath (IF inhaler is not strong enough). Patient not taking: Reported on 10/25/2019 12/26/18   Beau Fanny, FNP  famotidine (PEPCID) 20 MG tablet Take 1 tablet (20 mg total) by mouth 2 (two) times daily as needed for heartburn or indigestion. Patient not taking: Reported on 10/25/2019 05/28/19   Sharyon Cable, CNM  fluticasone [facility]) 50 MCG/ACT nasal spray Place 2 sprays into both nostrils daily. Patient not taking: Reported on 10/25/2019 12/26/18   Ettefagh, Aron Baba, MD    Allergies    Benadryl [diphenhydramine hcl], Gluten meal, Zithromax [azithromycin], and Lactose intolerance (gi)  Review of Systems   Review of Systems  Constitutional: Negative for fever.  Respiratory: Negative for cough and shortness of breath.   Cardiovascular: Negative for chest pain.  Gastrointestinal: Positive for abdominal pain, constipation and nausea. Negative for diarrhea, hematemesis, hematochezia and vomiting.  Genitourinary: Positive for vaginal discharge. Negative for dysuria, hematuria and vaginal bleeding.  All other systems reviewed and are negative.   Physical Exam Updated Vital Signs BP 118/84   Pulse 78   Temp 97.8 F (36.6 C) (Oral)   Resp 20   LMP 10/16/2019 (Exact Date)   SpO2 100%   Physical Exam Vitals and nursing note reviewed.  Constitutional:      Appearance: She is well-developed. She is not toxic-appearing or diaphoretic.  HENT:     Head: Normocephalic and atraumatic.     Mouth/Throat:     Mouth: Mucous membranes are moist.     Pharynx: Oropharynx is clear.  Eyes:      General: No scleral icterus.    Conjunctiva/sclera: Conjunctivae normal.  Cardiovascular:     Rate and Rhythm: Normal rate and regular rhythm.     Heart sounds: Normal heart sounds. No murmur.  Pulmonary:     Effort: Pulmonary effort is normal. No respiratory distress.     Breath sounds: Normal breath sounds.  Abdominal:     General: There is no distension.     Palpations: Abdomen is soft.     Tenderness: There is abdominal tenderness (mild) in the epigastric area and left upper quadrant. There is no right CVA tenderness, left CVA tenderness, guarding or rebound. Negative signs include Murphy's sign, Rovsing's sign, McBurney's sign and psoas sign.  Musculoskeletal:  Cervical back: Neck supple.  Skin:    General: Skin is warm and dry.  Neurological:     General: No focal deficit present.     Mental Status: She is alert.  Psychiatric:        Mood and Affect: Mood normal.        Behavior: Behavior normal.     ED Results / Procedures / Treatments   Labs (all labs ordered are listed, but only abnormal results are displayed) Labs Reviewed  URINALYSIS, ROUTINE W REFLEX MICROSCOPIC - Abnormal; Notable for the following components:      Result Value   Color, Urine STRAW (*)    All other components within normal limits  BASIC METABOLIC PANEL - Abnormal; Notable for the following components:   BUN 5 (*)    All other components within normal limits  URINE CULTURE  CBC  HEPATIC FUNCTION PANEL  LIPASE, BLOOD  I-STAT BETA HCG BLOOD, ED ([facility], [facility], [facility] ONLY)  GC/CHLAMYDIA PROBE AMP ([place]) NOT AT [facility]  WET PREP  (BD AFFIRM) ([place])    EKG None  Radiology No results found.  Procedures Procedures (including critical care time)  Medications Ordered in ED Medications - No data to display  ED Course  I have reviewed the triage vital signs and the nursing notes.  Pertinent labs & imaging results that were available during my care of the patient were reviewed by me and  considered in my medical decision making (see chart for details).    MDM Rules/Calculators/A&P                      Here for generalized abd pain.  Upper abd TTP on exam, denies pelvic pain.  S/P appendectomy.  Reports mild vaginal discharge, no pelvic pain.    DDx includes gastritis, PUD, pyelonephritis, PID, GC/Chl, Torsion, ectopic.  Labs reassuring without emergent changes, UA negative for infection.  Pregnancy test negative.  Lower suspicion of pelvic pathology at this time as her symptoms are primarily the upper abd, she has no pelvic TTP on abd exam, however as she had recent unprotected sex and some discharge will perform pelvic exam.  Pt eloped from the ED prior to pelvic exam.    Final Clinical Impression(s) / ED Diagnoses Final diagnoses:  Abdominal pain, unspecified abdominal location    Rx / DC Orders ED Discharge Orders    None       Aqsa Sensabaugh, [place], MD 10/25/19 3154    Blane Ohara, MD 10/27/19 0009

## 2019-10-26 LAB — URINE CULTURE: Culture: <10000 — AB

## 2019-11-19 ENCOUNTER — Ambulatory Visit: Payer: Medicaid Other | Admitting: Obstetrics & Gynecology

## 2019-11-25 ENCOUNTER — Other Ambulatory Visit (HOSPITAL_COMMUNITY)
Admission: RE | Admit: 2019-11-25 | Discharge: 2019-11-25 | Disposition: A | Discharge status: Home / Self Care | Payer: Medicaid Other | Source: Ambulatory Visit | Attending: Obstetrics | Admitting: Obstetrics

## 2019-11-25 ENCOUNTER — Other Ambulatory Visit: Payer: Self-pay

## 2019-11-25 ENCOUNTER — Ambulatory Visit (INDEPENDENT_AMBULATORY_CARE_PROVIDER_SITE_OTHER): Payer: Medicaid Other | Admitting: Obstetrics

## 2019-11-25 ENCOUNTER — Encounter: Payer: Self-pay | Admitting: Obstetrics

## 2019-11-25 VITALS — BP 116/77 | HR 80 | Wt 204.2 lb

## 2019-11-25 DIAGNOSIS — N9412 Deep dyspareunia: Secondary | ICD-10-CM

## 2019-11-25 DIAGNOSIS — B3731 Acute candidiasis of vulva and vagina: Secondary | ICD-10-CM

## 2019-11-25 DIAGNOSIS — Z30016 Encounter for initial prescription of transdermal patch hormonal contraceptive device: Secondary | ICD-10-CM

## 2019-11-25 DIAGNOSIS — Z113 Encounter for screening for infections with a predominantly sexual mode of transmission: Secondary | ICD-10-CM | POA: Diagnosis not present

## 2019-11-25 DIAGNOSIS — Z01419 Encounter for gynecological examination (general) (routine) without abnormal findings: Secondary | ICD-10-CM | POA: Diagnosis not present

## 2019-11-25 DIAGNOSIS — N898 Other specified noninflammatory disorders of vagina: Secondary | ICD-10-CM | POA: Insufficient documentation

## 2019-11-25 DIAGNOSIS — N760 Acute vaginitis: Secondary | ICD-10-CM

## 2019-11-25 DIAGNOSIS — B373 Candidiasis of vulva and vagina: Secondary | ICD-10-CM | POA: Diagnosis not present

## 2019-11-25 DIAGNOSIS — Z3009 Encounter for other general counseling and advice on contraception: Secondary | ICD-10-CM

## 2019-11-25 DIAGNOSIS — B9689 Other specified bacterial agents as the cause of diseases classified elsewhere: Secondary | ICD-10-CM

## 2019-11-25 LAB — POCT URINE PREGNANCY: Preg Test, Ur: NEGATIVE

## 2019-11-25 MED ORDER — XULANE 150-35 MCG/24HR TD PTWK: 1.0000 | MEDICATED_PATCH | TRANSDERMAL | 12 refills | Status: DC

## 2019-11-25 MED ORDER — FLUCONAZOLE 150 MG PO TABS: 150.0000 mg | ORAL_TABLET | Freq: Once | ORAL | 0 refills | Status: AC

## 2019-11-25 MED ORDER — SOLOSEC 2 G PO PACK: 1.0000 | PACK | Freq: Once | ORAL | 2 refills | Status: AC

## 2019-11-25 NOTE — Progress Notes (Signed)
Pt is here with c/o vaginal burning and itching. Pt reports she took a Plan B 3 days ago. Pt is currently SA, interested in trying the patch for contraception.

## 2019-11-25 NOTE — Progress Notes (Signed)
Patient ID: Melissa [name], female   DOB: 2001-01-03, 19 y.o.   MRN: [id number]  Chief Complaint  Patient presents with  . Gynecologic Exam    HPI Melissa [name] is a 19 y.o. female.  Complains of vaginal discharge, burning and itching.  Also has vaginal pain with intercourse.  She did have a repair of a perineal laceration. HPI  Past Medical History:  Diagnosis Date  . Anemia   . Anxiety   . Asthma    inhaler. last attack June 2019  . Congenital hydronephrosis 2001  . Constipation   . Depression   . Headache    migraines  . Seizures ([place])    last one in 2015 - on meds  . TBI (traumatic brain injury) ([place]) 2006    Past Surgical History:  Procedure Laterality Date  . APPENDECTOMY    . HERNIA REPAIR    . INTESTINAL MALROTATION REPAIR  2001    Family History  Problem Relation Age of Onset  . Depression Mother   . Stroke Mother   . Obesity Mother   . Post-traumatic stress disorder Mother   . Anxiety disorder Mother   . Schizophrenia Father     Social History Social History   Tobacco Use  . Smoking status: Former Research scientist (life sciences)  . Smokeless tobacco: Never Used  . Tobacco comment: black and milds, none since + UPT  Substance Use Topics  . Alcohol use: Never  . Drug use: Not Currently    Types: Marijuana    Comment: Episodic    Allergies  Allergen Reactions  . Benadryl [Diphenhydramine Hcl] Other (See Comments)    Causes seizures   . Gluten Meal Nausea And Vomiting  . Zithromax [Azithromycin] Anaphylaxis and Swelling  . Lactose Intolerance (Gi) Diarrhea    Current Outpatient Medications  Medication Sig Dispense Refill  . acetaminophen (TYLENOL) 325 MG tablet Take 2 tablets (650 mg total) by mouth every 6 (six) hours as needed (for pain scale < 4). (Patient not taking: Reported on 10/25/2019) 30 tablet 0  . albuterol (PROVENTIL) (2.5 MG/3ML) 0.083% nebulizer solution Take 3 mLs (2.5 mg total) by nebulization every 6 (six) hours as needed for  wheezing or shortness of breath (IF inhaler is not strong enough). (Patient not taking: Reported on 10/25/2019) 150 mL 1  . albuterol (VENTOLIN HFA) 108 (90 Base) MCG/ACT inhaler Inhale 2 puffs into the lungs every 6 (six) hours as needed for wheezing or shortness of breath (shortness of breath from reactive airway disease). (Patient not taking: Reported on 11/25/2019) 6.7 g 1  . famotidine (PEPCID) 20 MG tablet Take 1 tablet (20 mg total) by mouth 2 (two) times daily as needed for heartburn or indigestion. (Patient not taking: Reported on 10/25/2019) 30 tablet 1  . ferrous sulfate (IRON SUPPLEMENT) 325 (65 FE) MG tablet Take 325 mg by mouth daily with breakfast.    . fluconazole (DIFLUCAN) 150 MG tablet Take 1 tablet (150 mg total) by mouth once for 1 dose. 1 tablet 0  . fluticasone (FLONASE) 50 MCG/ACT nasal spray Place 2 sprays into both nostrils daily. (Patient not taking: Reported on 10/25/2019) 16 g 11  . ibuprofen (ADVIL) 600 MG tablet Take 1 tablet (600 mg total) by mouth every 6 (six) hours. (Patient not taking: Reported on 11/25/2019) 30 tablet 0  . norelgestromin-ethinyl estradiol Marilu Favre) 150-35 MCG/24HR transdermal patch Place 1 patch onto the skin once a week. 3 patch 12  . Secnidazole (SOLOSEC) 2 g PACK Take  1 packet by mouth once for 1 dose. Mix as directed with yogurt, pudding, applesauce, etc. 1 each 2   No current facility-administered medications for this visit.    Review of Systems Review of Systems Constitutional: negative for fatigue and weight loss Respiratory: negative for cough and wheezing Cardiovascular: negative for chest pain, fatigue and palpitations Gastrointestinal: negative for abdominal pain and change in bowel habits Genitourinary:negative Integument/breast: negative for nipple discharge Musculoskeletal:negative for myalgias Neurological: negative for gait problems and tremors Behavioral/Psych: negative for abusive relationship, depression Endocrine: negative for  temperature intolerance      Blood pressure 116/77, pulse 80, weight 204 lb 3.2 oz (92.6 kg), last menstrual period 11/11/2019, not currently breastfeeding.  Physical Exam Physical Exam General:   alert  Skin:   no rash or abnormalities  Lungs:   clear to auscultation bilaterally  Heart:   regular rate and rhythm, S1, S2 normal, no murmur, click, rub or gallop  Breasts:   normal without suspicious masses, skin or nipple changes or axillary nodes  Abdomen:  normal findings: no organomegaly, soft, non-tender and no hernia  Pelvis:  External genitalia: normal general appearance Urinary system: urethral meatus normal and bladder without fullness, nontender Vaginal: normal without tenderness, induration or masses Cervix: normal appearance Adnexa: normal bimanual exam Uterus: anteverted and non-tender, normal size    50% of 20 min visit spent on counseling and coordination of care.   Data Reviewed Labs Delivery Note  Assessment     1. Encounter for gynecological examination  2. Vaginal discharge Rx: - Cervicovaginal ancillary only( [place])  3. Screen for STD (sexually transmitted disease) Rx: - Hepatitis B surface antigen - Hepatitis C antibody - HIV Antibody (routine testing w rflx) - RPR  4. BV (bacterial vaginosis) Rx: - Secnidazole (SOLOSEC) 2 g PACK; Take 1 packet by mouth once for 1 dose. Mix as directed with yogurt, pudding, applesauce, etc.  Dispense: 1 each; Refill: 2  5. Candida vaginitis Rx: - fluconazole (DIFLUCAN) 150 MG tablet; Take 1 tablet (150 mg total) by mouth once for 1 dose.  Dispense: 1 tablet; Refill: 0  6. Deep dyspareunia in female - will follow clinically  7. Encounter for other general counseling and advice on contraception Rx: - POCT urine pregnancy  8. Encounter for initial prescription of transdermal patch hormonal contraceptive device Rx: - norelgestromin-ethinyl estradiol Burr Medico) 150-35 MCG/24HR transdermal patch; Place 1 patch  onto the skin once a week.  Dispense: 3 patch; Refill: 12    Plan    Follow up prn   Orders Placed This Encounter  Procedures  . Hepatitis B surface antigen  . Hepatitis C antibody  . HIV Antibody (routine testing w rflx)  . RPR  . POCT urine pregnancy   Meds ordered this encounter  Medications  . Secnidazole (SOLOSEC) 2 g PACK    Sig: Take 1 packet by mouth once for 1 dose. Mix as directed with yogurt, pudding, applesauce, etc.    Dispense:  1 each    Refill:  2  . norelgestromin-ethinyl estradiol Burr Medico) 150-35 MCG/24HR transdermal patch    Sig: Place 1 patch onto the skin once a week.    Dispense:  3 patch    Refill:  12  . fluconazole (DIFLUCAN) 150 MG tablet    Sig: Take 1 tablet (150 mg total) by mouth once for 1 dose.    Dispense:  1 tablet    Refill:  0     Brock Bad, MD 11/25/2019 2:54  PM

## 2019-11-26 ENCOUNTER — Other Ambulatory Visit: Payer: Self-pay | Admitting: Obstetrics

## 2019-11-26 DIAGNOSIS — B9689 Other specified bacterial agents as the cause of diseases classified elsewhere: Secondary | ICD-10-CM

## 2019-11-26 DIAGNOSIS — N760 Acute vaginitis: Secondary | ICD-10-CM

## 2019-11-26 LAB — HEPATITIS C ANTIBODY: Hep C Virus Ab: <0.1 s/co ratio (ref 0.0–0.9)

## 2019-11-26 LAB — RPR: RPR Ser Ql: NONREACTIVE

## 2019-11-26 LAB — CERVICOVAGINAL ANCILLARY ONLY
Bacterial Vaginitis (gardnerella): POSITIVE — AB
Candida Glabrata: NEGATIVE
Candida Vaginitis: NEGATIVE
Chlamydia: NEGATIVE
Comment: NEGATIVE
Comment: NEGATIVE
Comment: NEGATIVE
Comment: NEGATIVE
Comment: NEGATIVE
Comment: NORMAL
Neisseria Gonorrhea: NEGATIVE
Trichomonas: NEGATIVE

## 2019-11-26 LAB — HIV ANTIBODY (ROUTINE TESTING W REFLEX): HIV Screen 4th Generation wRfx: NONREACTIVE

## 2019-11-26 LAB — HEPATITIS B SURFACE ANTIGEN: Hepatitis B Surface Ag: NEGATIVE

## 2019-11-26 MED ORDER — SOLOSEC 2 G PO PACK: 1.0000 | PACK | Freq: Once | ORAL | 2 refills | Status: AC

## 2019-12-10 ENCOUNTER — Ambulatory Visit (INDEPENDENT_AMBULATORY_CARE_PROVIDER_SITE_OTHER): Payer: Medicaid Other | Admitting: Pediatrics

## 2019-12-11 ENCOUNTER — Ambulatory Visit (INDEPENDENT_AMBULATORY_CARE_PROVIDER_SITE_OTHER): Payer: Medicaid Other | Admitting: Pediatrics

## 2019-12-18 ENCOUNTER — Emergency Department (HOSPITAL_COMMUNITY)
Admission: EM | Admit: 2019-12-18 | Discharge: 2019-12-18 | Disposition: A | Discharge status: Home / Self Care | Payer: Medicaid Other | Attending: Emergency Medicine | Admitting: Emergency Medicine

## 2019-12-18 ENCOUNTER — Encounter (HOSPITAL_COMMUNITY): Payer: Self-pay | Admitting: Emergency Medicine

## 2019-12-18 ENCOUNTER — Other Ambulatory Visit: Payer: Self-pay

## 2019-12-18 DIAGNOSIS — Z79899 Other long term (current) drug therapy: Secondary | ICD-10-CM | POA: Insufficient documentation

## 2019-12-18 DIAGNOSIS — J45909 Unspecified asthma, uncomplicated: Secondary | ICD-10-CM | POA: Diagnosis not present

## 2019-12-18 DIAGNOSIS — F419 Anxiety disorder, unspecified: Secondary | ICD-10-CM | POA: Diagnosis not present

## 2019-12-18 DIAGNOSIS — T50905A Adverse effect of unspecified drugs, medicaments and biological substances, initial encounter: Secondary | ICD-10-CM | POA: Insufficient documentation

## 2019-12-18 DIAGNOSIS — Z87891 Personal history of nicotine dependence: Secondary | ICD-10-CM | POA: Diagnosis not present

## 2019-12-18 DIAGNOSIS — D573 Sickle-cell trait: Secondary | ICD-10-CM | POA: Diagnosis not present

## 2019-12-18 DIAGNOSIS — F70 Mild intellectual disabilities: Secondary | ICD-10-CM | POA: Insufficient documentation

## 2019-12-18 LAB — RAPID URINE DRUG SCREEN, HOSP PERFORMED
Amphetamines: NOT DETECTED
Barbiturates: NOT DETECTED
Benzodiazepines: NOT DETECTED
Cocaine: NOT DETECTED
Opiates: NOT DETECTED
Tetrahydrocannabinol: NOT DETECTED

## 2019-12-18 LAB — I-STAT BETA HCG BLOOD, ED (MC, WL, AP ONLY): I-stat hCG, quantitative: <5 m[IU]/mL (ref <5)

## 2019-12-18 LAB — CBG MONITORING, ED: Glucose-Capillary: 114 mg/dL — ABNORMAL HIGH (ref 70–99)

## 2019-12-18 NOTE — ED Triage Notes (Signed)
Patient states she was at a party dancing and she consumed an edible and she hasn't felt right since, jittery and increased anxiety. Present to ED obviously intoxicated.

## 2019-12-18 NOTE — ED Notes (Signed)
PT able to ambulate to and from restroom w/o assist

## 2019-12-18 NOTE — ED Provider Notes (Signed)
[facility]-EMERGENCY DEPT Provider Note   CSN: [id number] Arrival date & time: 12/18/19  0408     History Chief Complaint  Patient presents with  . Drug / Alcohol Assessment    Consumed edibles    Melissa [name] is a 19 y.o. female.  The history is provided by the patient.  Ingestion This is a new problem. The current episode started 1 to 2 hours ago. The problem occurs constantly. The problem has not changed since onset.Pertinent negatives include no chest pain, no abdominal pain and no shortness of breath. Nothing aggravates the symptoms. Nothing relieves the symptoms. She has tried nothing for the symptoms. The treatment provided no relief.       Past Medical History:  Diagnosis Date  . Anemia   . Anxiety   . Asthma    inhaler. last attack June 2019  . Congenital hydronephrosis 2001  . Constipation   . Depression   . Headache    migraines  . Seizures ([name])    last one in 2015 - on meds  . TBI (traumatic brain injury) [facility]) 2006    Patient Active Problem List   Diagnosis Date Noted  . Normal labor 08/20/2019  . Migraine with aura and without status migrainosus, not intractable 08/12/2019  . Seizures ([name])   . Inadequate oral nutritional intake 04/27/2019  . Sickle cell trait ([name]) 02/13/2019  . Rh negative state in antepartum period 02/05/2019  . Supervision of high risk pregnancy, antepartum 02/05/2019  . 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 DMDD (disruptive mood dysregulation disorder) ([name]) 07/21/2016  . 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 Bilateral low back pain without sciatica 03/08/2016  . Acne vulgaris 08/03/2015  . Chronic constipation 08/03/2015  . 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 Migraine without aura and without status migrainosus, not intractable 04/16/2015  . Episodic tension-type headache, not intractable  04/16/2015  . 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 GAD (generalized anxiety disorder) 11/25/2013  . Suicide attempt by drug ingestion ([name]) 11/18/2013    Past Surgical History:  Procedure Laterality Date  . APPENDECTOMY    . HERNIA REPAIR    . INTESTINAL MALROTATION REPAIR  2001     OB History    Gravida  1   Para  1   Term  1   Preterm      AB      Living  1     SAB      TAB      Ectopic      Multiple  0   Live Births  1           Family History  Problem Relation Age of Onset  . Depression Mother   . Stroke Mother   . Obesity Mother   . Post-traumatic stress disorder Mother   . Anxiety disorder Mother   . Schizophrenia Father     Social History   Tobacco Use  . Smoking status: Former Games developer  . Smokeless tobacco: Never Used  . Tobacco comment: black and milds, none since + UPT  Substance Use Topics  . Alcohol use: Never  . Drug use: Not Currently    Types: Marijuana    Comment: Episodic    Home Medications Prior to Admission medications   Medication Sig Start Date End Date Taking? Authorizing Provider  acetaminophen (TYLENOL) 325 MG tablet Take 2 tablets (650 mg total)  by mouth every 6 (six) hours as needed (for pain scale < 4). Patient not taking: Reported on 10/25/2019 08/22/19   Joselyn Arrow, MD  albuterol (PROVENTIL) (2.5 MG/3ML) 0.083% nebulizer solution Take 3 mLs (2.5 mg total) by nebulization every 6 (six) hours as needed for wheezing or shortness of breath (IF inhaler is not strong enough). Patient not taking: Reported on 10/25/2019 12/26/18   Beau Fanny, FNP  albuterol (VENTOLIN HFA) 108 (90 Base) MCG/ACT inhaler Inhale 2 puffs into the lungs every 6 (six) hours as needed for wheezing or shortness of breath (shortness of breath from reactive airway disease). Patient not taking: Reported on 11/25/2019 03/15/19   Arvilla Market, DO  famotidine (PEPCID) 20 MG tablet Take 1 tablet (20 mg total) by mouth 2 (two) times daily  as needed for heartburn or indigestion. Patient not taking: Reported on 10/25/2019 05/28/19   Sharyon Cable, CNM  ferrous sulfate (IRON SUPPLEMENT) 325 (65 FE) MG tablet Take 325 mg by mouth daily with breakfast.    [provider]  fluticasone (FLONASE) 50 MCG/ACT nasal spray Place 2 sprays into both nostrils daily. Patient not taking: Reported on 10/25/2019 12/26/18   Ettefagh, Aron Baba, MD  ibuprofen (ADVIL) 600 MG tablet Take 1 tablet (600 mg total) by mouth every 6 (six) hours. Patient not taking: Reported on 11/25/2019 08/22/19   Joselyn Arrow, MD  norelgestromin-ethinyl estradiol [facility]) 150-35 MCG/24HR transdermal patch Place 1 patch onto the skin once a week. 11/25/19   Brock Bad, MD    Allergies    Benadryl [diphenhydramine hcl], Gluten meal, Zithromax [azithromycin], and Lactose intolerance (gi)  Review of Systems   Review of Systems  Constitutional: Negative for fever.  HENT: Negative for congestion.   Eyes: Negative for visual disturbance.  Respiratory: Negative for shortness of breath.   Cardiovascular: Negative for chest pain and palpitations.  Gastrointestinal: Negative for abdominal pain.  Musculoskeletal: Negative for arthralgias.  Neurological: Negative for dizziness.  Psychiatric/Behavioral: Negative for agitation. The patient is nervous/anxious.   All other systems reviewed and are negative.   Physical Exam Updated Vital Signs BP 138/89   Pulse (!) 113   Temp 97.8 F (36.6 C)   Resp 18   SpO2 100%   Physical Exam Vitals and nursing note reviewed.  Constitutional:      General: She is not in acute distress.    Appearance: Normal appearance.  HENT:     Head: Normocephalic and atraumatic.     Nose: Nose normal.  Eyes:     Conjunctiva/sclera: Conjunctivae normal.     Pupils: Pupils are equal, round, and reactive to light.  Cardiovascular:     Rate and Rhythm: Normal rate and regular rhythm.     Pulses: Normal pulses.     Heart sounds:  Normal heart sounds.  Pulmonary:     Effort: Pulmonary effort is normal.     Breath sounds: Normal breath sounds.  Abdominal:     General: Abdomen is flat. Bowel sounds are normal.     Tenderness: There is no abdominal tenderness. There is no guarding or rebound.  Musculoskeletal:        General: Normal range of motion.     Cervical back: Normal range of motion and neck supple.  Skin:    General: Skin is warm and dry.     Capillary Refill: Capillary refill takes less than 2 seconds.  Neurological:     General: No focal deficit present.  Mental Status: She is alert and oriented to person, place, and time.     Deep Tendon Reflexes: Reflexes normal.  Psychiatric:        Mood and Affect: Mood is anxious.     ED Results / Procedures / Treatments   Labs (all labs ordered are listed, but only abnormal results are displayed) Results for orders placed or performed during the hospital encounter of 12/18/19  Rapid urine drug screen (hospital performed)  Result Value Ref Range   Opiates NONE DETECTED NONE DETECTED   Cocaine NONE DETECTED NONE DETECTED   Benzodiazepines NONE DETECTED NONE DETECTED   Amphetamines NONE DETECTED NONE DETECTED   Tetrahydrocannabinol NONE DETECTED NONE DETECTED   Barbiturates NONE DETECTED NONE DETECTED  I-Stat Beta hCG blood, ED ([facility], [facility], [facility] only)  Result Value Ref Range   I-stat hCG, quantitative <5.0 <5 mIU/mL   Comment 3          POC CBG, ED  Result Value Ref Range   Glucose-Capillary 114 (H) 70 - 99 mg/dL   No results found.  Radiology No results found.  Procedures Procedures (including critical care time)  Medications Ordered in ED Medications - No data to display  ED Course  I have reviewed the triage vital signs and the nursing notes.  Pertinent labs & imaging results that were available during my care of the patient were reviewed by me and considered in my medical decision making (see chart for details).    Well appearing.  Po  challenged and ambulated about the department successfully.  Patient counseled on not using drugs or alcohol as this is a know side effect of marijuana derivatives.  Patient verbalizes understanding and agrees to follow up.  Melissa [name] was evaluated in Emergency Department on 12/18/2019 for the symptoms described in the history of present illness. She was evaluated in the context of the global COVID-19 pandemic, which necessitated consideration that the patient might be at risk for infection with the SARS-CoV-2 virus that causes COVID-19. Institutional protocols and algorithms that pertain to the evaluation of patients at risk for COVID-19 are in a state of rapid change based on information released by regulatory bodies including the CDC and federal and state organizations. These policies and algorithms were followed during the patient's care in the ED.  Final Clinical Impression(s) / ED Diagnoses Return for weakness, numbness, changes in vision or speech, fevers >100.4 unrelieved by medication, shortness of breath, intractable vomiting, or diarrhea, abdominal pain, Inability to tolerate liquids or food, cough, altered mental status or any concerns. No signs of systemic illness or infection. The patient is nontoxic-appearing on exam and vital signs are within normal limits.   I have reviewed the triage vital signs and the nursing notes. Pertinent labs &imaging results that were available during my care of the patient were reviewed by me and considered in my medical decision making (see chart for details).  After history, exam, and medical workup I feel the patient has been appropriately medically screened and is safe for discharge home. Pertinent diagnoses were discussed with the patient. Patient was givenstrictreturn precautions.   Raeford Brandenburg, MD 12/18/19 [id number]

## 2019-12-18 NOTE — ED Notes (Signed)
Water given  

## 2019-12-24 ENCOUNTER — Ambulatory Visit (INDEPENDENT_AMBULATORY_CARE_PROVIDER_SITE_OTHER): Payer: Medicaid Other | Admitting: Pediatrics

## 2019-12-25 ENCOUNTER — Encounter (HOSPITAL_COMMUNITY): Payer: Self-pay | Admitting: Emergency Medicine

## 2019-12-25 ENCOUNTER — Other Ambulatory Visit: Payer: Self-pay

## 2019-12-25 ENCOUNTER — Emergency Department (HOSPITAL_COMMUNITY)
Admission: EM | Admit: 2019-12-25 | Discharge: 2019-12-26 | Disposition: A | Discharge status: Home / Self Care | Payer: Medicaid Other | Attending: Emergency Medicine | Admitting: Emergency Medicine

## 2019-12-25 ENCOUNTER — Emergency Department (HOSPITAL_COMMUNITY)
Admission: EM | Admit: 2019-12-25 | Discharge: 2019-12-25 | Disposition: A | Discharge status: Home / Self Care | Payer: Medicaid Other | Source: Home / Self Care

## 2019-12-25 DIAGNOSIS — Y999 Unspecified external cause status: Secondary | ICD-10-CM | POA: Insufficient documentation

## 2019-12-25 DIAGNOSIS — J45909 Unspecified asthma, uncomplicated: Secondary | ICD-10-CM | POA: Insufficient documentation

## 2019-12-25 DIAGNOSIS — Y939 Activity, unspecified: Secondary | ICD-10-CM | POA: Insufficient documentation

## 2019-12-25 DIAGNOSIS — Z87891 Personal history of nicotine dependence: Secondary | ICD-10-CM | POA: Insufficient documentation

## 2019-12-25 DIAGNOSIS — Z79899 Other long term (current) drug therapy: Secondary | ICD-10-CM | POA: Diagnosis not present

## 2019-12-25 DIAGNOSIS — Z5321 Procedure and treatment not carried out due to patient leaving prior to being seen by health care provider: Secondary | ICD-10-CM | POA: Insufficient documentation

## 2019-12-25 DIAGNOSIS — S01111A Laceration without foreign body of right eyelid and periocular area, initial encounter: Secondary | ICD-10-CM | POA: Insufficient documentation

## 2019-12-25 DIAGNOSIS — S0181XA Laceration without foreign body of other part of head, initial encounter: Secondary | ICD-10-CM

## 2019-12-25 DIAGNOSIS — Y929 Unspecified place or not applicable: Secondary | ICD-10-CM | POA: Insufficient documentation

## 2019-12-25 DIAGNOSIS — H1131 Conjunctival hemorrhage, right eye: Secondary | ICD-10-CM | POA: Diagnosis not present
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
## 2019-12-26 MED ORDER — FLUORESCEIN SODIUM 1 MG OP STRP
1.0000 | ORAL_STRIP | Freq: Once | OPHTHALMIC | Status: AC
  Administered 2019-12-26: 1 via OPHTHALMIC
  Filled 2019-12-26: qty 1

## 2019-12-26 MED ORDER — LIDOCAINE-EPINEPHRINE (PF) 2 %-1:200000 IJ SOLN
20.0000 mL | Freq: Once | INTRAMUSCULAR | Status: AC
  Administered 2019-12-26: 20 mL via INTRADERMAL
  Filled 2019-12-26: qty 20
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
## 2019-12-26 NOTE — ED Notes (Signed)
Discharge instructions discussed with pt. Pt verbalized understanding with no questions at this time. Pt to go home with family at bedside 

## 2019-12-26 NOTE — Discharge Instructions (Signed)
Sutures that were placed will dissolve on their own.  Please do not get the wound wet for the next 48 hours.  After that you may allow soapy water to run down the wound.  Do not submerge for 2 weeks.

## 2020-01-22 ENCOUNTER — Emergency Department (HOSPITAL_COMMUNITY)
Admission: EM | Admit: 2020-01-22 | Discharge: 2020-01-23 | Disposition: A | Discharge status: Home / Self Care | Payer: Medicaid Other | Attending: Emergency Medicine | Admitting: Emergency Medicine

## 2020-01-22 ENCOUNTER — Emergency Department (HOSPITAL_COMMUNITY): Payer: Medicaid Other

## 2020-01-22 ENCOUNTER — Encounter (HOSPITAL_COMMUNITY): Payer: Self-pay

## 2020-01-22 DIAGNOSIS — R0981 Nasal congestion: Secondary | ICD-10-CM | POA: Diagnosis not present

## 2020-01-22 DIAGNOSIS — Z79899 Other long term (current) drug therapy: Secondary | ICD-10-CM | POA: Diagnosis not present

## 2020-01-22 DIAGNOSIS — Z20822 Contact with and (suspected) exposure to covid-19: Secondary | ICD-10-CM | POA: Diagnosis not present

## 2020-01-22 DIAGNOSIS — F70 Mild intellectual disabilities: Secondary | ICD-10-CM | POA: Diagnosis not present

## 2020-01-22 DIAGNOSIS — R059 Cough, unspecified: Secondary | ICD-10-CM

## 2020-01-22 DIAGNOSIS — Z87891 Personal history of nicotine dependence: Secondary | ICD-10-CM | POA: Diagnosis not present

## 2020-01-22 DIAGNOSIS — R05 Cough: Secondary | ICD-10-CM | POA: Insufficient documentation

## 2020-01-22 DIAGNOSIS — J45909 Unspecified asthma, uncomplicated: Secondary | ICD-10-CM | POA: Insufficient documentation

## 2020-01-22 DIAGNOSIS — R0789 Other chest pain: Secondary | ICD-10-CM | POA: Insufficient documentation

## 2020-01-22 LAB — POC SARS CORONAVIRUS 2 AG -  ED: SARS Coronavirus 2 Ag: NEGATIVE

## 2020-01-22 LAB — CBC
HCT: 40 % (ref 36.0–46.0)
Hemoglobin: 13.3 g/dL (ref 12.0–15.0)
MCH: 27.9 pg (ref 26.0–34.0)
MCHC: 33.3 g/dL (ref 30.0–36.0)
MCV: 84 fL (ref 80.0–100.0)
Platelets: 206 10*3/uL (ref 150–400)
RBC: 4.76 MIL/uL (ref 3.87–5.11)
RDW: 13.6 % (ref 11.5–15.5)
WBC: 6.1 10*3/uL (ref 4.0–10.5)
nRBC: 0 % (ref 0.0–0.2)

## 2020-01-22 LAB — BASIC METABOLIC PANEL
Anion gap: 8 (ref 5–15)
BUN: 8 mg/dL (ref 6–20)
CO2: 24 mmol/L (ref 22–32)
Calcium: 9.3 mg/dL (ref 8.9–10.3)
Chloride: 105 mmol/L (ref 98–111)
Creatinine, Ser: 0.85 mg/dL (ref 0.44–1.00)
GFR calc Af Amer: >60 mL/min (ref >60)
GFR calc non Af Amer: >60 mL/min (ref >60)
Glucose, Bld: 90 mg/dL (ref 70–99)
Potassium: 3.8 mmol/L (ref 3.5–5.1)
Sodium: 137 mmol/L (ref 135–145)

## 2020-01-22 LAB — I-STAT BETA HCG BLOOD, ED (MC, WL, AP ONLY): I-stat hCG, quantitative: <5 m[IU]/mL (ref <5)

## 2020-01-22 LAB — TROPONIN I (HIGH SENSITIVITY)
Troponin I (High Sensitivity): <2 ng/L (ref <18)
Troponin I (High Sensitivity): <2 ng/L (ref <18)

## 2020-01-22 MED ORDER — SODIUM CHLORIDE 0.9% FLUSH: 3.0000 mL | Freq: Once | INTRAVENOUS | Status: DC

## 2020-01-22 NOTE — ED Triage Notes (Signed)
Pt arrives to ED w/ c/o 8/10 chest pain and sob. Pt also c/o congestion, cough, and intermittent fever.

## 2020-01-23 ENCOUNTER — Other Ambulatory Visit: Payer: Self-pay | Admitting: Advanced Practice Midwife

## 2020-01-23 ENCOUNTER — Other Ambulatory Visit: Payer: Self-pay | Admitting: Pediatrics

## 2020-01-23 ENCOUNTER — Encounter (HOSPITAL_COMMUNITY): Payer: Self-pay | Admitting: Emergency Medicine

## 2020-01-23 DIAGNOSIS — J301 Allergic rhinitis due to pollen: Secondary | ICD-10-CM

## 2020-01-23 LAB — SARS CORONAVIRUS 2 (TAT 6-24 HRS): SARS Coronavirus 2: NEGATIVE

## 2020-01-23 MED ORDER — GUAIFENESIN ER 600 MG PO TB12
600.0000 mg | ORAL_TABLET | Freq: Two times a day (BID) | ORAL | Status: DC
  Administered 2020-01-23: 600 mg via ORAL
  Filled 2020-01-23: qty 1

## 2020-01-23 MED ORDER — ACETAMINOPHEN 500 MG PO TABS
1000.0000 mg | ORAL_TABLET | Freq: Once | ORAL | Status: AC
  Administered 2020-01-23: 1000 mg via ORAL
  Filled 2020-01-23: qty 2

## 2020-01-23 MED ORDER — LORATADINE 10 MG PO TABS: 10.0000 mg | ORAL_TABLET | Freq: Once | ORAL | Status: DC

## 2020-01-23 MED ORDER — MUCINEX 600 MG PO TB12
600.0000 mg | ORAL_TABLET | Freq: Two times a day (BID) | ORAL | 0 refills | Status: DC | PRN
Start: 2020-01-23 — End: 2020-05-07

## 2020-01-23 NOTE — Discharge Instructions (Addendum)
Person Under Monitoring Name: Melissa [name]  Location: [address] [place] [address]   Infection Prevention Recommendations for Individuals Confirmed to have, or Being Evaluated for, 2019 Novel Coronavirus (COVID-19) Infection Who Receive Care at Home  Individuals who are confirmed to have, or are being evaluated for, COVID-19 should follow the prevention steps below until a healthcare provider or local or state health department says they can return to normal activities.  Stay home except to get medical care You should restrict activities outside your home, except for getting medical care. Do not go to work, school, or public areas, and do not use public transportation or taxis.  Call ahead before visiting your doctor Before your medical appointment, call the healthcare provider and tell them that you have, or are being evaluated for, COVID-19 infection. This will help the healthcare providers office take steps to keep other people from getting infected. Ask your healthcare provider to call the local or state health department.  Monitor your symptoms Seek prompt medical attention if your illness is worsening (e.g., difficulty breathing). Before going to your medical appointment, call the healthcare provider and tell them that you have, or are being evaluated for, COVID-19 infection. Ask your healthcare provider to call the local or state health department.  Wear a facemask You should wear a facemask that covers your nose and mouth when you are in the same room with other people and when you visit a healthcare provider. People who live with or visit you should also wear a facemask while they are in the same room with you.  Separate yourself from other people in your home As much as possible, you should stay in a different room from other people in your home. Also, you should use a separate bathroom, if available.  Avoid sharing household items You  should not share dishes, drinking glasses, cups, eating utensils, towels, bedding, or other items with other people in your home. After using these items, you should wash them thoroughly with soap and water.  Cover your coughs and sneezes Cover your mouth and nose with a tissue when you cough or sneeze, or you can cough or sneeze into your sleeve. Throw used tissues in a lined trash can, and immediately wash your hands with soap and water for at least 20 seconds or use an alcohol-based hand rub.  Wash your [employer] your hands often and thoroughly with soap and water for at least 20 seconds. You can use an alcohol-based hand sanitizer if soap and water are not available and if your hands are not visibly dirty. Avoid touching your eyes, nose, and mouth with unwashed hands.   Prevention Steps for Caregivers and Household Members of Individuals Confirmed to have, or Being Evaluated for, COVID-19 Infection Being Cared for in the Home  If you live with, or provide care at home for, a person confirmed to have, or being evaluated for, COVID-19 infection please follow these guidelines to prevent infection:  Follow healthcare providers instructions Make sure that you understand and can help the patient follow any healthcare provider instructions for all care.  Provide for the patients basic needs You should help the patient with basic needs in the home and provide support for getting groceries, prescriptions, and other personal needs.  Monitor the patients symptoms If they are getting sicker, call his or her medical provider and tell them that the patient has, or is being evaluated for, COVID-19 infection. This will help the healthcare  providers office take steps to keep other people from getting infected. Ask the healthcare provider to call the local or state health department.  Limit the number of people who have contact with the patient If possible, have only one caregiver for the  patient. Other household members should stay in another home or place of residence. If this is not possible, they should stay in another room, or be separated from the patient as much as possible. Use a separate bathroom, if available. Restrict visitors who do not have an essential need to be in the home.  Keep older adults, very young children, and other sick people away from the patient Keep older adults, very young children, and those who have compromised immune systems or chronic health conditions away from the patient. This includes people with chronic heart, lung, or kidney conditions, diabetes, and cancer.  Ensure good ventilation Make sure that shared spaces in the home have good air flow, such as from an air conditioner or an opened window, weather permitting.  Wash your hands often Wash your hands often and thoroughly with soap and water for at least 20 seconds. You can use an alcohol based hand sanitizer if soap and water are not available and if your hands are not visibly dirty. Avoid touching your eyes, nose, and mouth with unwashed hands. Use disposable paper towels to dry your hands. If not available, use dedicated cloth towels and replace them when they become wet.  Wear a facemask and gloves Wear a disposable facemask at all times in the room and gloves when you touch or have contact with the patients blood, body fluids, and/or secretions or excretions, such as sweat, saliva, sputum, nasal mucus, vomit, urine, or feces.  Ensure the mask fits over your nose and mouth tightly, and do not touch it during use. Throw out disposable facemasks and gloves after using them. Do not reuse. Wash your hands immediately after removing your facemask and gloves. If your personal clothing becomes contaminated, carefully remove clothing and launder. Wash your hands after handling contaminated clothing. Place all used disposable facemasks, gloves, and other waste in a lined container before  disposing them with other household waste. Remove gloves and wash your hands immediately after handling these items.  Do not share dishes, glasses, or other household items with the patient Avoid sharing household items. You should not share dishes, drinking glasses, cups, eating utensils, towels, bedding, or other items with a patient who is confirmed to have, or being evaluated for, COVID-19 infection. After the person uses these items, you should wash them thoroughly with soap and water.  Wash laundry thoroughly Immediately remove and wash clothes or bedding that have blood, body fluids, and/or secretions or excretions, such as sweat, saliva, sputum, nasal mucus, vomit, urine, or feces, on them. Wear gloves when handling laundry from the patient. Read and follow directions on labels of laundry or clothing items and detergent. In general, wash and dry with the warmest temperatures recommended on the label.  Clean all areas the individual has used often Clean all touchable surfaces, such as counters, tabletops, doorknobs, bathroom fixtures, toilets, phones, keyboards, tablets, and bedside tables, every day. Also, clean any surfaces that may have blood, body fluids, and/or secretions or excretions on them. Wear gloves when cleaning surfaces the patient has come in contact with. Use a diluted bleach solution (e.g., dilute bleach with 1 part bleach and 10 parts water) or a household disinfectant with a label that says EPA-registered for coronaviruses. To make  a bleach solution at home, add 1 tablespoon of bleach to 1 quart (4 cups) of water. For a larger supply, add  cup of bleach to 1 gallon (16 cups) of water. Read labels of cleaning products and follow recommendations provided on product labels. Labels contain instructions for safe and effective use of the cleaning product including precautions you should take when applying the product, such as wearing gloves or eye protection and making sure you  have good ventilation during use of the product. Remove gloves and wash hands immediately after cleaning.  Monitor yourself for signs and symptoms of illness Caregivers and household members are considered close contacts, should monitor their health, and will be asked to limit movement outside of the home to the extent possible. Follow the monitoring steps for close contacts listed on the symptom monitoring form.   ? If you have additional questions, contact your local health department or call the epidemiologist on call at [phone] (available 24/7). ? This guidance is subject to change. For the most up-to-date guidance from [facility], please refer to their website: [email or web address]

## 2020-01-23 NOTE — ED Notes (Signed)
Patient verbalizes understanding of discharge instructions. Opportunity for questioning and answers were provided. Armband removed by staff, pt discharged from ED.  

## 2020-01-23 NOTE — ED Provider Notes (Addendum)
[facility] EMERGENCY DEPARTMENT Provider Note   CSN: [id number] Arrival date & time: 01/22/20  1628     History Chief Complaint  Patient presents with   Chest Pain    Melissa [name] is a 19 y.o. female.  The history is provided by the patient.  Chest Pain Chest pain location: entire chest with coughing  Pain quality: dull   Pain radiates to:  Does not radiate Pain severity:  Mild Onset quality:  Gradual Timing:  Sporadic Progression:  Unchanged Chronicity:  New Context comment:  Wheezing and coughing Relieved by:  Nothing Worsened by:  Nothing Ineffective treatments:  None tried Associated symptoms: cough   Associated symptoms: no abdominal pain, no AICD problem, no altered mental status, no anorexia, no anxiety, no back pain, no claudication, no diaphoresis, no dizziness, no dysphagia, no fatigue, no fever, no headache, no heartburn, no lower extremity edema, no nausea, no near-syncope, no numbness, no orthopnea, no palpitations, no PND, no shortness of breath, no syncope, no vomiting and no weakness   Associated symptoms comment:  Nasal congestion and wheezing Risk factors: no birth control, not pregnant and no prior DVT/PE   Pain with coughing and wheezing.  No travel.  No leg pain or swelling.  No OCP.  Has allergies but does not take anything.       Past Medical History:  Diagnosis Date   Anemia    Anxiety    Asthma    inhaler. last attack June 2019   Congenital hydronephrosis 2001   Constipation    Depression    Headache    migraines   Seizures ([place])    last one in 2015 - on meds   TBI (traumatic brain injury) ([place]) 2006    Patient Active Problem List   Diagnosis Date Noted   Normal labor 08/20/2019   Migraine with aura and without status migrainosus, not intractable 08/12/2019   Seizures ([place])    Inadequate oral nutritional intake 04/27/2019   Sickle cell trait ([place]) 02/13/2019   Rh negative state in  antepartum period 02/05/2019   Supervision of high risk pregnancy, antepartum 02/05/2019   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   DMDD (disruptive mood dysregulation disorder) ([place]) 07/21/2016   MDD (major depressive disorder), recurrent severe, 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   Bilateral low back pain without sciatica 03/08/2016   Acne vulgaris 08/03/2015   Chronic constipation 08/03/2015   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/16/2015   Migraine without aura and without status migrainosus, not intractable 04/16/2015   Episodic tension-type headache, not intractable 04/16/2015   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   Suicide attempt by drug ingestion ([place]) 11/18/2013    Past Surgical History:  Procedure Laterality Date   APPENDECTOMY     HERNIA REPAIR     INTESTINAL MALROTATION REPAIR  2001     OB History    Gravida  1   Para  1   Term  1   Preterm      AB      Living  1     SAB      TAB      Ectopic      Multiple  0   Live Births  1           Family History  Problem Relation Age of Onset   Depression Mother    Stroke Mother    Obesity Mother  Post-traumatic stress disorder Mother    Anxiety disorder Mother    Schizophrenia Father     Social History   Tobacco Use   Smoking status: Former Smoker   Smokeless tobacco: Never Used   Tobacco comment: black and milds, none since + UPT  Substance Use Topics   Alcohol use: Never   Drug use: Not Currently    Types: Marijuana    Comment: Episodic    Home Medications Prior to Admission medications   Medication Sig Start Date End Date Taking? Authorizing Provider  acetaminophen (TYLENOL) 325 MG tablet Take 2 tablets (650 mg total) by mouth every 6 (six) hours as needed (for pain scale < 4). 08/22/19   Fair, Marin Shutter, MD  albuterol (PROVENTIL) (2.5 MG/3ML) 0.083% nebulizer solution  Take 3 mLs (2.5 mg total) by nebulization every 6 (six) hours as needed for wheezing or shortness of breath (IF inhaler is not strong enough). 12/26/18   Withrow, Elyse Jarvis, FNP  albuterol (VENTOLIN HFA) 108 (90 Base) MCG/ACT inhaler Inhale 2 puffs into the lungs every 6 (six) hours as needed for wheezing or shortness of breath (shortness of breath from reactive airway disease). 03/15/19   Nicolette Bang, DO  famotidine (PEPCID) 20 MG tablet Take 1 tablet (20 mg total) by mouth 2 (two) times daily as needed for heartburn or indigestion. Patient not taking: Reported on 10/25/2019 05/28/19   Lajean Manes, CNM  ferrous sulfate (IRON SUPPLEMENT) 325 (65 FE) MG tablet Take 325 mg by mouth daily with breakfast.    [provider]  fluticasone (FLONASE) 50 MCG/ACT nasal spray Place 2 sprays into both nostrils daily. Patient not taking: Reported on 10/25/2019 12/26/18   Ettefagh, Paul Dykes, MD  ibuprofen (ADVIL) 600 MG tablet Take 1 tablet (600 mg total) by mouth every 6 (six) hours. Patient not taking: Reported on 11/25/2019 08/22/19   Chauncey Mann, MD  norelgestromin-ethinyl estradiol Marilu Favre) 150-35 MCG/24HR transdermal patch Place 1 patch onto the skin once a week. Patient not taking: Reported on 12/26/2019 11/25/19   Shelly Bombard, MD    Allergies    Benadryl [diphenhydramine hcl], Gluten meal, Zithromax [azithromycin], and Lactose intolerance (gi)  Review of Systems   Review of Systems  Constitutional: Negative for diaphoresis, fatigue and fever.  HENT: Positive for congestion. Negative for trouble swallowing.   Eyes: Negative for visual disturbance.  Respiratory: Positive for cough. Negative for shortness of breath.   Cardiovascular: Negative for palpitations, orthopnea, claudication, leg swelling, syncope, PND and near-syncope.  Gastrointestinal: Negative for abdominal pain, anorexia, heartburn, nausea and vomiting.  Genitourinary: Negative for difficulty urinating.   Musculoskeletal: Negative for back pain and joint swelling.  Skin: Negative for rash.  Neurological: Negative for dizziness, weakness, numbness and headaches.  Psychiatric/Behavioral: Negative for agitation.    Physical Exam Updated Vital Signs BP 120/81    Pulse 97    Temp 99 F (37.2 C) (Oral)    Resp 16    SpO2 99%   Physical Exam Vitals and nursing note reviewed.  Constitutional:      General: She is not in acute distress.    Appearance: Normal appearance.  HENT:     Head: Normocephalic and atraumatic.     Nose: Nose normal.  Eyes:     Conjunctiva/sclera: Conjunctivae normal.     Pupils: Pupils are equal, round, and reactive to light.  Cardiovascular:     Rate and Rhythm: Normal rate and regular rhythm.     Pulses: Normal  pulses.     Heart sounds: Normal heart sounds.  Pulmonary:     Effort: Pulmonary effort is normal.     Breath sounds: Normal breath sounds.  Abdominal:     General: Abdomen is flat. Bowel sounds are normal.     Tenderness: There is no abdominal tenderness. There is no guarding.  Musculoskeletal:        General: Normal range of motion.     Cervical back: Normal range of motion and neck supple.  Skin:    General: Skin is warm and dry.     Capillary Refill: Capillary refill takes less than 2 seconds.  Neurological:     General: No focal deficit present.     Mental Status: She is alert and oriented to person, place, and time.     Deep Tendon Reflexes: Reflexes normal.  Psychiatric:        Mood and Affect: Mood normal.        Behavior: Behavior normal.     ED Results / Procedures / Treatments   Labs (all labs ordered are listed, but only abnormal results are displayed) Results for orders placed or performed during the hospital encounter of 39/76/73  Basic metabolic panel  Result Value Ref Range   Sodium 137 135 - 145 mmol/L   Potassium 3.8 3.5 - 5.1 mmol/L   Chloride 105 98 - 111 mmol/L   CO2 24 22 - 32 mmol/L   Glucose, Bld 90 70 - 99  mg/dL   BUN 8 6 - 20 mg/dL   Creatinine, Ser 0.85 0.44 - 1.00 mg/dL   Calcium 9.3 8.9 - 10.3 mg/dL   GFR calc non Af Amer >60 >60 mL/min   GFR calc Af Amer >60 >60 mL/min   Anion gap 8 5 - 15  CBC  Result Value Ref Range   WBC 6.1 4.0 - 10.5 K/uL   RBC 4.76 3.87 - 5.11 MIL/uL   Hemoglobin 13.3 12.0 - 15.0 g/dL   HCT 40.0 36.0 - 46.0 %   MCV 84.0 80.0 - 100.0 fL   MCH 27.9 26.0 - 34.0 pg   MCHC 33.3 30.0 - 36.0 g/dL   RDW 13.6 11.5 - 15.5 %   Platelets 206 150 - 400 K/uL   nRBC 0.0 0.0 - 0.2 %  I-Stat beta hCG blood, ED  Result Value Ref Range   I-stat hCG, quantitative <5.0 <5 mIU/mL   Comment 3          POC SARS Coronavirus 2 Ag-ED - Nasal Swab (BD Veritor Kit)  Result Value Ref Range   SARS Coronavirus 2 Ag NEGATIVE NEGATIVE  Troponin I (High Sensitivity)  Result Value Ref Range   Troponin I (High Sensitivity) <2 <18 ng/L  Troponin I (High Sensitivity)  Result Value Ref Range   Troponin I (High Sensitivity) <2 <18 ng/L   DG Chest 2 View  Result Date: 01/22/2020 CLINICAL DATA:  Chest pain, shortness of breath EXAM: CHEST - 2 VIEW COMPARISON:  11/22/2017 FINDINGS: The heart size and mediastinal contours are within normal limits. Both lungs are clear. The visualized skeletal structures are unremarkable. IMPRESSION: No active cardiopulmonary disease. Electronically Signed   By: Davina Poke D.O.   On: 01/22/2020 17:31    EKG  EKG Interpretation  Date/Time:  Thursday January 22 2020 16:49:49 EDT Ventricular Rate:  97 PR Interval:  144 QRS Duration: 86 QT Interval:  340 QTC Calculation: 431 R Axis:   64 Text Interpretation: Normal sinus rhythm Confirmed  by Veatrice Kells [phone]) on 01/23/2020 1:10:26 AM       Radiology DG Chest 2 View  Result Date: 01/22/2020 CLINICAL DATA:  Chest pain, shortness of breath EXAM: CHEST - 2 VIEW COMPARISON:  11/22/2017 FINDINGS: The heart size and mediastinal contours are within normal limits. Both lungs are clear. The visualized  skeletal structures are unremarkable. IMPRESSION: No active cardiopulmonary disease. Electronically Signed   By: Davina Poke D.O.   On: 01/22/2020 17:31    Procedures Procedures (including critical care time)  Medications Ordered in ED Medications  sodium chloride flush (NS) 0.9 % injection 3 mL (has no administration in time range)  acetaminophen (TYLENOL) tablet 1,000 mg (has no administration in time range)  loratadine (CLARITIN) tablet 10 mg (has no administration in time range)    ED Course  I have reviewed the triage vital signs and the nursing notes.  Pertinent labs & imaging results that were available during my care of the patient were reviewed by me and considered in my medical decision making (see chart for details).    PERC negative wells 0 highly doubt PE in this low risk patient.  Ruled out for MI in the ED.  Heart score is 1.  Very low risk for mace. This is pain with coughing.  Will send covid swab as patient has been out unmasked.  Will place in home isolation pending outcome of swab.      Melissa [name] was evaluated in Emergency Department on 01/23/2020 for the symptoms described in the history of present illness. She was evaluated in the context of the global COVID-19 pandemic, which necessitated consideration that the patient might be at risk for infection with the SARS-CoV-2 virus that causes COVID-19. Institutional protocols and algorithms that pertain to the evaluation of patients at risk for COVID-19 are in a state of rapid change based on information released by regulatory bodies including the CDC and federal and state organizations. These policies and algorithms were followed during the patient's care in the ED.   Final Clinical Impression(s) / ED Diagnoses Return for intractable cough, coughing up blood,fevers >100.4 unrelieved by medication, shortness of breath, intractable vomiting, chest pain, shortness of breath, weakness,numbness, changes in  speech, facial asymmetry,abdominal pain, passing out,Inability to tolerate liquids or food, cough, altered mental status or any concerns. No signs of systemic illness or infection. The patient is nontoxic-appearing on exam and vital signs are within normal limits.   I have reviewed the triage vital signs and the nursing notes. Pertinent labs &imaging results that were available during my care of the patient were reviewed by me and considered in my medical decision making (see chart for details).After history, exam, and medical workup I feel the patient has beenappropriately medically screened and is safe for discharge home. Pertinent diagnoses were discussed with the patient. Patient was given return precautions.   Melissa Mcnamara, MD 01/23/20 Dennie Maizes, Melissa Gaccione, MD 01/23/20 0111

## 2020-02-11 ENCOUNTER — Telehealth: Payer: Self-pay

## 2020-02-11 NOTE — Telephone Encounter (Signed)
Pt called stating that she is having white-ish clear discharge for about 1 week.  There is some vaginal itching.  Flagyl and Diflucan were sent to her pharmacy.  Pt notified that if this treatment does not work then she needs to come in the office for a self swab. Pt verbalized understanding. -EH/RMA

## 2020-02-28 ENCOUNTER — Other Ambulatory Visit: Payer: Self-pay

## 2020-02-28 ENCOUNTER — Emergency Department (HOSPITAL_COMMUNITY)
Admission: EM | Admit: 2020-02-28 | Discharge: 2020-02-28 | Disposition: A | Discharge status: Home / Self Care | Payer: Medicaid Other | Attending: Emergency Medicine | Admitting: Emergency Medicine

## 2020-02-28 DIAGNOSIS — Z5321 Procedure and treatment not carried out due to patient leaving prior to being seen by health care provider: Secondary | ICD-10-CM | POA: Insufficient documentation

## 2020-02-28 DIAGNOSIS — R0989 Other specified symptoms and signs involving the circulatory and respiratory systems: Secondary | ICD-10-CM | POA: Diagnosis present

## 2020-02-28 NOTE — ED Notes (Signed)
Pt called 3x for triage. No answer. Pt not seen in waiting room, waiting room bathrooms, or outside lobby doors.

## 2020-02-29 ENCOUNTER — Ambulatory Visit (HOSPITAL_COMMUNITY)
Admission: EM | Admit: 2020-02-29 | Discharge: 2020-02-29 | Disposition: A | Discharge status: Home / Self Care | Payer: Medicaid Other | Attending: Family Medicine | Admitting: Family Medicine

## 2020-02-29 ENCOUNTER — Other Ambulatory Visit: Payer: Self-pay

## 2020-02-29 ENCOUNTER — Encounter (HOSPITAL_COMMUNITY): Payer: Self-pay

## 2020-02-29 DIAGNOSIS — R300 Dysuria: Secondary | ICD-10-CM | POA: Insufficient documentation

## 2020-02-29 DIAGNOSIS — Z888 Allergy status to other drugs, medicaments and biological substances status: Secondary | ICD-10-CM | POA: Diagnosis not present

## 2020-02-29 DIAGNOSIS — Z3202 Encounter for pregnancy test, result negative: Secondary | ICD-10-CM | POA: Diagnosis not present

## 2020-02-29 DIAGNOSIS — Z881 Allergy status to other antibiotic agents status: Secondary | ICD-10-CM | POA: Diagnosis not present

## 2020-02-29 DIAGNOSIS — Z113 Encounter for screening for infections with a predominantly sexual mode of transmission: Secondary | ICD-10-CM | POA: Diagnosis not present

## 2020-02-29 DIAGNOSIS — Z20822 Contact with and (suspected) exposure to covid-19: Secondary | ICD-10-CM | POA: Diagnosis not present

## 2020-02-29 DIAGNOSIS — Z79899 Other long term (current) drug therapy: Secondary | ICD-10-CM | POA: Diagnosis not present

## 2020-02-29 DIAGNOSIS — Z87891 Personal history of nicotine dependence: Secondary | ICD-10-CM | POA: Insufficient documentation

## 2020-02-29 DIAGNOSIS — J45909 Unspecified asthma, uncomplicated: Secondary | ICD-10-CM | POA: Insufficient documentation

## 2020-02-29 DIAGNOSIS — Z791 Long term (current) use of non-steroidal anti-inflammatories (NSAID): Secondary | ICD-10-CM | POA: Insufficient documentation

## 2020-02-29 DIAGNOSIS — M545 Low back pain, unspecified: Secondary | ICD-10-CM

## 2020-02-29 DIAGNOSIS — J029 Acute pharyngitis, unspecified: Secondary | ICD-10-CM | POA: Diagnosis not present

## 2020-02-29 DIAGNOSIS — Z87820 Personal history of traumatic brain injury: Secondary | ICD-10-CM | POA: Insufficient documentation

## 2020-02-29 LAB — POCT URINALYSIS DIP (DEVICE)
Bilirubin Urine: NEGATIVE
Glucose, UA: NEGATIVE mg/dL
Ketones, ur: NEGATIVE mg/dL
Nitrite: NEGATIVE
Protein, ur: NEGATIVE mg/dL
Specific Gravity, Urine: 1.015 (ref 1.005–1.030)
Urobilinogen, UA: 1 mg/dL (ref 0.0–1.0)
pH: 7.5 (ref 5.0–8.0)

## 2020-02-29 LAB — POC URINE PREG, ED: Preg Test, Ur: NEGATIVE

## 2020-02-29 LAB — SARS CORONAVIRUS 2 (TAT 6-24 HRS): SARS Coronavirus 2: NEGATIVE

## 2020-02-29 LAB — HIV ANTIBODY (ROUTINE TESTING W REFLEX): HIV Screen 4th Generation wRfx: NONREACTIVE

## 2020-02-29 NOTE — ED Triage Notes (Addendum)
Pt presents to UC for sore throat x2 weeks. Pt endorsing tmax 99.3, chills, subjective fevers at home. Pt denies n/v/d, body aches, head aches, cough, runny nose, loss of taste or smell.   Of note pt also requesting STD and pregnancy testing.   Pt also requesting COVID testing

## 2020-02-29 NOTE — ED Notes (Signed)
No answer when called for intake/triage.  

## 2020-02-29 NOTE — ED Provider Notes (Signed)
[facility]   [id number] 02/29/20 Arrival Time: 1512   CC: CONCERN FOR STD  SUBJECTIVE:  Melissa [name] is a 19 y.o. female who presents requesting STI screening.  Reports that she is having some burning with urination, but no vaginal discharge.  Reports that she is having unprotected sex with a new female partner.  Reports that partner states he is asymptomatic.  Also reports that she has been experiencing a sore throat and just "feeling bad."  Denies sick contacts.  Denies similar symptoms in the past.  LMP started yesterday.  Denies fever, chills, nausea, vomiting, abdominal or pelvic pain, urinary symptoms, vaginal itching, vaginal odor, vaginal bleeding, dyspareunia, vaginal rashes or lesions.    ROS: As per HPI.  All other pertinent ROS negative.     Past Medical History:  Diagnosis Date  . Anemia   . Anxiety   . Asthma    inhaler. last attack June 2019  . Congenital hydronephrosis 2001  . Constipation   . Depression   . Headache    migraines  . Seizures ([name])    last one in 2015 - on meds  . TBI (traumatic brain injury) ([name]) 2006   Past Surgical History:  Procedure Laterality Date  . APPENDECTOMY    . HERNIA REPAIR    . INTESTINAL MALROTATION REPAIR  2001   Allergies  Allergen Reactions  . Benadryl [Diphenhydramine Hcl] Other (See Comments)    Causes seizures   . Gluten Meal Nausea And Vomiting  . Zithromax [Azithromycin] Anaphylaxis and Swelling  . Lactose Intolerance (Gi) Diarrhea   No current facility-administered medications on file prior to encounter.   Current Outpatient Medications on File Prior to Encounter  Medication Sig Dispense Refill  . ferrous sulfate (IRON SUPPLEMENT) 325 (65 FE) MG tablet Take 325 mg by mouth daily with breakfast.    . acetaminophen (TYLENOL) 325 MG tablet Take 2 tablets (650 mg total) by mouth every 6 (six) hours as needed (for pain scale < 4). 30 tablet 0  . albuterol (PROVENTIL) (2.5 MG/3ML) 0.083%  nebulizer solution Take 3 mLs (2.5 mg total) by nebulization every 6 (six) hours as needed for wheezing or shortness of breath (IF inhaler is not strong enough). 150 mL 1  . albuterol (VENTOLIN HFA) 108 (90 Base) MCG/ACT inhaler Inhale 2 puffs into the lungs every 6 (six) hours as needed for wheezing or shortness of breath (shortness of breath from reactive airway disease). 6.7 g 1  . cetirizine (ZYRTEC) 10 MG tablet TAKE 1 TABLET BY MOUTH EVERY DAY AS NEEDED FOR ALLERGY 30 tablet 11  . famotidine (PEPCID) 20 MG tablet Take 1 tablet (20 mg total) by mouth 2 (two) times daily as needed for heartburn or indigestion. (Patient not taking: Reported on 10/25/2019) 30 tablet 1  . fluticasone (FLONASE) 50 MCG/ACT nasal spray Place 2 sprays into both nostrils daily. (Patient not taking: Reported on 10/25/2019) 16 g 11  . guaiFENesin (MUCINEX) 600 MG 12 hr tablet Take 1 tablet (600 mg total) by mouth 2 (two) times daily as needed. 8 tablet 0  . ibuprofen (ADVIL) 600 MG tablet Take 1 tablet (600 mg total) by mouth every 6 (six) hours. (Patient not taking: Reported on 11/25/2019) 30 tablet 0  . norelgestromin-ethinyl estradiol [employer]) 150-35 MCG/24HR transdermal patch Place 1 patch onto the skin once a week. (Patient not taking: Reported on 12/26/2019) 3 patch 12   Social History   Socioeconomic History  . Marital status: Unknown  Spouse name: Not on file  . Number of children: Not on file  . Years of education: Not on file  . Highest education level: Not on file  Occupational History  . Not on file  Tobacco Use  . Smoking status: Former Games developer  . Smokeless tobacco: Never Used  . Tobacco comment: black and milds, none since + UPT  Vaping Use  . Vaping Use: Never used  Substance and Sexual Activity  . Alcohol use: Never  . Drug use: Not Currently    Types: Marijuana    Comment: Episodic  . Sexual activity: Yes    Birth control/protection: None  Other Topics Concern  . 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   She attended [employer].    She lives with her parents, siblings, and grandfather.    She enjoys writing, singing, dancing, cooking, and shopping.   She will attend [employer] in the fall.   Social Determinants of Health   Financial Resource Strain:   . Difficulty of Paying Living Expenses:   Food Insecurity:   . Worried About Programme researcher, broadcasting/film/video in the Last Year:   . Barista in the Last Year:   Transportation Needs:   . Freight forwarder (Medical):   Marland Kitchen Lack of Transportation (Non-Medical):   Physical Activity:   . Days of Exercise per Week:   . Minutes of Exercise per Session:   Stress:   . Feeling of Stress :   Social Connections:   . Frequency of Communication with Friends and Family:   . Frequency of Social Gatherings with Friends and Family:   . Attends Religious Services:   . Active Member of Clubs or Organizations:   . Attends Banker Meetings:   Marland Kitchen Marital Status:   Intimate Partner Violence:   . Fear of Current or Ex-Partner:   . Emotionally Abused:   Marland Kitchen Physically Abused:   . Sexually Abused:    Family History  Problem Relation Age of Onset  . Depression Mother   . Stroke Mother   . Obesity Mother   . Post-traumatic stress disorder Mother   . Anxiety disorder Mother   . Schizophrenia Father     OBJECTIVE:  Vitals:   02/29/20 1621  BP: 122/88  Pulse: 78  Resp: 16  Temp: 98.5 F (36.9 C)  TempSrc: Oral  SpO2: 100%     General appearance: alert, NAD, appears stated age Head: NCAT Throat: lips, mucosa, and tongue normal; teeth and gums normal Lungs: CTA bilaterally without adventitious breath sounds Heart: regular rate and rhythm.  Radial pulses 2+ symmetrical bilaterally Back: no CVA tenderness Abdomen: soft, suprapubic tenderness, bowel sounds normal; no masses or organomegaly; no guarding or rebound tenderness GU: deferred Skin: warm and dry Psychological:   Alert and cooperative. Normal mood and affect.  LABS:  Results for orders placed or performed during the hospital encounter of 02/29/20  SARS CORONAVIRUS 2 (TAT 6-24 HRS) Nasopharyngeal Nasopharyngeal Swab   Specimen: Nasopharyngeal Swab  Result Value Ref Range   SARS Coronavirus 2 NEGATIVE NEGATIVE  Urine culture   Specimen: Urine, Random  Result Value Ref Range   Specimen Description URINE, RANDOM    Special Requests NONE    Culture (A)     <10,000 COLONIES/mL INSIGNIFICANT GROWTH Performed at [facility], [address] [place], [place] [address]    Report Status 03/01/2020 FINAL  RPR  Result Value Ref Range   RPR Ser Ql NON REACTIVE NON REACTIVE  HIV Antibody (routine testing w rflx)  Result Value Ref Range   HIV Screen 4th Generation wRfx Non Reactive Non Reactive  POC urine pregnancy  Result Value Ref Range   Preg Test, Ur NEGATIVE NEGATIVE  POCT urinalysis dip (device)  Result Value Ref Range   Glucose, UA NEGATIVE NEGATIVE mg/dL   Bilirubin Urine NEGATIVE NEGATIVE   Ketones, ur NEGATIVE NEGATIVE mg/dL   Specific Gravity, Urine 1.015 1.005 - 1.030   Hgb urine dipstick LARGE (A) NEGATIVE   pH 7.5 5.0 - 8.0   Protein, ur NEGATIVE NEGATIVE mg/dL   Urobilinogen, UA 1.0 0.0 - 1.0 mg/dL   Nitrite NEGATIVE NEGATIVE   Leukocytes,Ua SMALL (A) NEGATIVE  Cervicovaginal ancillary only  Result Value Ref Range   Neisseria Gonorrhea Negative    Chlamydia Negative    Trichomonas Positive (A)    Bacterial Vaginitis (gardnerella) Positive (A)    Candida Vaginitis Negative    Candida Glabrata Negative    Comment      Normal Reference Range Bacterial Vaginosis - Negative   Comment Normal Reference Range Candida Species - Negative    Comment Normal Reference Range Candida Galbrata - Negative    Comment Normal Reference Range Trichomonas - Negative    Comment Normal Reference Ranger Chlamydia - Negative    Comment      Normal Reference Range Neisseria Gonorrhea -  Negative    Labs Reviewed  URINE CULTURE - Abnormal; Notable for the following components:      Result Value   Culture   (*)    Value: <10,000 COLONIES/mL INSIGNIFICANT GROWTH Performed at [facility], [address], [place], [place] [address]    All other components within normal limits  POCT URINALYSIS DIP (DEVICE) - Abnormal; Notable for the following components:   Hgb urine dipstick LARGE (*)    Leukocytes,Ua SMALL (*)    All other components within normal limits  CERVICOVAGINAL ANCILLARY ONLY - Abnormal; Notable for the following components:   Trichomonas Positive (*)    Bacterial Vaginitis (gardnerella) Positive (*)    All other components within normal limits  SARS CORONAVIRUS 2 (TAT 6-24 HRS)  RPR  HIV ANTIBODY (ROUTINE TESTING W REFLEX)  POC URINE PREG, ED    ASSESSMENT & PLAN:  1. Viral pharyngitis   2. Acute bilateral low back pain without sciatica   3. Dysuria   4. Screen for STD (sexually transmitted disease)     No orders of the defined types were placed in this encounter.   Pending: Labs Reviewed  URINE CULTURE - Abnormal; Notable for the following components:      Result Value   Culture   (*)    Value: <10,000 COLONIES/mL INSIGNIFICANT GROWTH Performed at [facility], [address], [place], [place] [address]    All other components within normal limits  POCT URINALYSIS DIP (DEVICE) - Abnormal; Notable for the following components:   Hgb urine dipstick LARGE (*)    Leukocytes,Ua SMALL (*)    All other components within normal limits  CERVICOVAGINAL ANCILLARY ONLY - Abnormal; Notable for the following components:   Trichomonas Positive (*)    Bacterial Vaginitis (gardnerella) Positive (*)    All other components within normal limits  SARS CORONAVIRUS 2 (TAT 6-24 HRS)  RPR  HIV ANTIBODY (ROUTINE TESTING W REFLEX)  POC URINE PREG, ED    Urine pregnancy negative  today UA not concerning for infection Strep test negative Will  culture and inform of positive results that need further treatment Covid test ordered  Vaginal self swab obtained HIV/ syphilis testing today We will be in contact if anything is positive and treatment is needed We will follow up with you regarding the results of your test If tests are positive, please abstain from sexual activity until you and your partner(s) are treated Follow up with PCP or [facility] if symptoms persists Return here or go to ER if you have any new or worsening symptoms    Covid swab obtained in office today.  Patient instructed to quarantine until results are back and negative.  If results are negative, patient may resume daily schedule as tolerated once they are fever free for 24 hours without the use of antipyretic medications.  If results are positive, patient instructed to quarantine 10 days from today.  Patient instructed to follow-up with primary care with this office as needed.  Patient instructed to follow-up in the ER for trouble swallowing, trouble breathing, other concerning symptoms.   Reviewed expectations re: course of current medical issues. Questions answered. Outlined signs and symptoms indicating need for more acute intervention. Patient verbalized understanding. After Visit Summary given.        Moshe CiproMatthews, Jaramie Bastos, NP 03/02/20 [phone]

## 2020-02-29 NOTE — Discharge Instructions (Addendum)
You may have a urinary tract infection. We are going to culture your urine and will call you as soon as we have the results.   Drink plenty of water, 8-10 glasses per day.   You may take AZO over the counter for painful urination.  Follow up with your primary care provider as needed.   Go to the Emergency Department if you experience severe pain, shortness of breath, high fever, or other concerns.   Your COVID test is pending.  You should self quarantine until the test result is back.    Take Tylenol as needed for fever or discomfort.  Rest and keep yourself hydrated.    Go to the emergency department if you develop shortness of breath, severe diarrhea, high fever not relieved by Tylenol or ibuprofen, or other concerning symptoms.    Your blood test will be back likely by tomorrow  Your swab test will be back in about 2 days  Follow-up with this office as needed

## 2020-03-01 ENCOUNTER — Emergency Department (HOSPITAL_COMMUNITY)
Admission: EM | Admit: 2020-03-01 | Discharge: 2020-03-01 | Disposition: A | Discharge status: Home / Self Care | Payer: Medicaid Other | Attending: Emergency Medicine | Admitting: Emergency Medicine

## 2020-03-01 ENCOUNTER — Other Ambulatory Visit: Payer: Self-pay

## 2020-03-01 ENCOUNTER — Encounter (HOSPITAL_COMMUNITY): Payer: Self-pay | Admitting: Emergency Medicine

## 2020-03-01 DIAGNOSIS — N899 Noninflammatory disorder of vagina, unspecified: Secondary | ICD-10-CM | POA: Diagnosis present

## 2020-03-01 DIAGNOSIS — Z5321 Procedure and treatment not carried out due to patient leaving prior to being seen by health care provider: Secondary | ICD-10-CM | POA: Insufficient documentation

## 2020-03-01 LAB — CERVICOVAGINAL ANCILLARY ONLY
Bacterial Vaginitis (gardnerella): POSITIVE — AB
Candida Glabrata: NEGATIVE
Candida Vaginitis: NEGATIVE
Chlamydia: NEGATIVE
Comment: NEGATIVE
Comment: NEGATIVE
Comment: NEGATIVE
Comment: NEGATIVE
Comment: NEGATIVE
Comment: NORMAL
Neisseria Gonorrhea: NEGATIVE
Trichomonas: POSITIVE — AB

## 2020-03-01 LAB — RPR: RPR Ser Ql: NONREACTIVE

## 2020-03-01 LAB — URINE CULTURE: Culture: <10000 — AB

## 2020-03-01 NOTE — ED Notes (Signed)
Called x 2 no answer

## 2020-03-01 NOTE — ED Triage Notes (Signed)
Pt states she was tested positive for trichomonas and vaginal infection a week ago and she never got treatment for it.

## 2020-03-02 ENCOUNTER — Telehealth (HOSPITAL_COMMUNITY): Payer: Self-pay

## 2020-03-02 MED ORDER — METRONIDAZOLE 500 MG PO TABS: 500.0000 mg | ORAL_TABLET | Freq: Two times a day (BID) | ORAL | 0 refills | Status: DC

## 2020-04-12 ENCOUNTER — Emergency Department (HOSPITAL_COMMUNITY)
Admission: AD | Admit: 2020-04-12 | Discharge: 2020-04-13 | Disposition: A | Discharge status: Home / Self Care | Payer: Medicaid Other | Attending: Emergency Medicine | Admitting: Emergency Medicine

## 2020-04-12 ENCOUNTER — Other Ambulatory Visit: Payer: Self-pay

## 2020-04-12 DIAGNOSIS — R519 Headache, unspecified: Secondary | ICD-10-CM | POA: Diagnosis not present

## 2020-04-12 DIAGNOSIS — M549 Dorsalgia, unspecified: Secondary | ICD-10-CM | POA: Insufficient documentation

## 2020-04-12 DIAGNOSIS — Z5321 Procedure and treatment not carried out due to patient leaving prior to being seen by health care provider: Secondary | ICD-10-CM | POA: Insufficient documentation

## 2020-04-12 DIAGNOSIS — M545 Low back pain: Secondary | ICD-10-CM

## 2020-04-12 DIAGNOSIS — R531 Weakness: Secondary | ICD-10-CM | POA: Diagnosis present

## 2020-04-12 DIAGNOSIS — Z3202 Encounter for pregnancy test, result negative: Secondary | ICD-10-CM

## 2020-04-12 DIAGNOSIS — Z87820 Personal history of traumatic brain injury: Secondary | ICD-10-CM | POA: Insufficient documentation

## 2020-04-12 LAB — POCT PREGNANCY, URINE: Preg Test, Ur: NEGATIVE

## 2020-04-12 LAB — URINALYSIS, ROUTINE W REFLEX MICROSCOPIC
Bilirubin Urine: NEGATIVE
Glucose, UA: NEGATIVE mg/dL
Ketones, ur: NEGATIVE mg/dL
Nitrite: NEGATIVE
Protein, ur: NEGATIVE mg/dL
RBC / HPF: >50 RBC/hpf — ABNORMAL HIGH (ref 0–5)
Specific Gravity, Urine: 1.017 (ref 1.005–1.030)
pH: 8 (ref 5.0–8.0)

## 2020-04-12 NOTE — MAU Note (Signed)
Dr. Jacqulyn Bath notified by Mindi Junker NP and he accepted the patient. [facility] charge nurse notified. Transport called.

## 2020-04-12 NOTE — ED Notes (Signed)
Pt called for VS, no response.  °

## 2020-04-12 NOTE — MAU Note (Addendum)
Pt reports to MAU stating she is unsure if she is pregnant because she took one test and it looked positive but the next one she took was negative. Pt reports she thinks she started her period today at first it was light spotting and it has since then picked up and she passed a clot. Pt reports she is having weakness and back pain. Pt reports she has been having HA since last week. Pt states she has a hx of a brain injury and her last seizure was in 2015. Pt reports she does not think that is what is going on.   Pt reports her weakness started at 1100 am. Pt reports she is constantly having to move.

## 2020-04-12 NOTE — MAU Provider Note (Signed)
First Provider Initiated Contact with Patient 04/12/20 1942      S [name] is a 19 y.o. [id number] non-pregnant female who presents to MAU today with complaint of low back pain, clots during her period and a HA. Patient did not endorse weakness to provider. Patient reports one home UPT with faint pink line, but that was read after several hours. Patient reports second UPT at home was negative when read at appropriate time. UPT in MAU negative.  O BP 128/75 (BP Location: Right Arm)   Pulse 91   Temp 98.5 F (36.9 C) (Oral)   Resp 19   Wt 82.1 kg   LMP 04/12/2020   BMI 28.36 kg/m    Patient Vitals for the past 24 hrs:  BP Temp Temp src Pulse Resp Weight  04/12/20 1921 128/75 98.5 F (36.9 C) Oral 91 19 82.1 kg   Physical Exam Vitals and nursing note reviewed.  Constitutional:      General: She is not in acute distress.    Appearance: Normal appearance. She is not ill-appearing, toxic-appearing or diaphoretic.  HENT:     Head: Normocephalic and atraumatic.  Pulmonary:     Effort: Pulmonary effort is normal.  Neurological:     Mental Status: She is alert and oriented to person, place, and time.  Psychiatric:        Mood and Affect: Mood normal.        Behavior: Behavior normal.        Thought Content: Thought content normal.        Judgment: Judgment normal.    A Non pregnant female Medical screening exam complete UPT negative  P Discharge from MAU in stable condition Patient given the option of transfer to  [facility] for further evaluation or seek care in outpatient facility of choice, pt elects transfer to ED Report called and given to Dr. Jacqulyn Bath, who accepts transfer List of options for follow-up given  Warning signs for worsening condition that would warrant emergency follow-up discussed Patient may return to MAU as needed for pregnancy related complaints  Katlynne Mckercher, Odie Sera, NP 04/12/2020 8:10 PM

## 2020-05-07 ENCOUNTER — Other Ambulatory Visit: Payer: Self-pay

## 2020-05-07 ENCOUNTER — Ambulatory Visit (INDEPENDENT_AMBULATORY_CARE_PROVIDER_SITE_OTHER): Payer: Medicaid Other | Admitting: Pediatrics

## 2020-05-07 ENCOUNTER — Encounter (INDEPENDENT_AMBULATORY_CARE_PROVIDER_SITE_OTHER): Payer: Self-pay | Admitting: Pediatrics

## 2020-05-07 VITALS — BP 100/78 | HR 84 | Ht 67.0 in | Wt 175.0 lb

## 2020-05-07 DIAGNOSIS — F411 Generalized anxiety disorder: Secondary | ICD-10-CM | POA: Diagnosis not present

## 2020-05-07 DIAGNOSIS — G43009 Migraine without aura, not intractable, without status migrainosus: Secondary | ICD-10-CM

## 2020-05-07 DIAGNOSIS — G44219 Episodic tension-type headache, not intractable: Secondary | ICD-10-CM

## 2020-05-07 DIAGNOSIS — Z87898 Personal history of other specified conditions: Secondary | ICD-10-CM

## 2020-05-07 MED ORDER — RIZATRIPTAN BENZOATE 10 MG PO TBDP: ORAL_TABLET | ORAL | 5 refills | Status: DC

## 2020-05-07 NOTE — Patient Instructions (Addendum)
[facility] [phone]; [phone] [address] [place]  It was great to see you today.  I am glad that you are not having any seizures.  I will fill out her DMV forms and send them in.  We will mail copies to your home.  I want you to keep calendar and send it to me through MyChart.  I am going to give you a medicine called rizatriptan to help treat your migraines along with 2 ibuprofen.  You should only take this when you are having a migraine.  We will use your calendar to decide whether or not you need to be on another medicine to control your migraines.  I hope the community and wellness center will build to help you with the anxiety issue.  As I told you as we move towards May 20, 2021 going to need to get you an adult neurologist.  In part that will depend upon whether we are just dealing with migraines or if unfortunately the seizures come back.
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
## 2020-05-11 ENCOUNTER — Telehealth (INDEPENDENT_AMBULATORY_CARE_PROVIDER_SITE_OTHER): Payer: Self-pay | Admitting: Pediatrics

## 2020-05-11 NOTE — Telephone Encounter (Signed)
°  Who's calling (name and relationship to patient) : Colin Mulders ( self)  Best contact number: [phone]  Provider they see: Dr. Sharene Skeans  Reason for call: Patient dropped off the [facility] paperwork needed. It is in the Dr Box     PRESCRIPTION REFILL ONLY  Name of prescription:  Pharmacy:

## 2020-05-12 NOTE — Telephone Encounter (Signed)
Paperwork is complete.  The patient did not fill out your sign page 1.  You need to call her in to do that before we can send the form out to [facility].  After she fills out page 1 you can make a copy of it for her but hold onto the original scan it into the chart (at least pages 1 2 and 7) and mail the original.

## 2020-05-21 ENCOUNTER — Encounter (HOSPITAL_COMMUNITY): Payer: Self-pay

## 2020-05-21 ENCOUNTER — Other Ambulatory Visit: Payer: Self-pay

## 2020-05-21 ENCOUNTER — Ambulatory Visit (HOSPITAL_COMMUNITY)
Admission: EM | Admit: 2020-05-21 | Discharge: 2020-05-21 | Disposition: A | Discharge status: Home / Self Care | Payer: Medicaid Other | Attending: Family Medicine | Admitting: Family Medicine

## 2020-05-21 DIAGNOSIS — Z113 Encounter for screening for infections with a predominantly sexual mode of transmission: Secondary | ICD-10-CM | POA: Insufficient documentation

## 2020-05-21 DIAGNOSIS — R109 Unspecified abdominal pain: Secondary | ICD-10-CM

## 2020-05-21 DIAGNOSIS — Z3202 Encounter for pregnancy test, result negative: Secondary | ICD-10-CM | POA: Diagnosis not present

## 2020-05-21 DIAGNOSIS — N898 Other specified noninflammatory disorders of vagina: Secondary | ICD-10-CM | POA: Diagnosis not present

## 2020-05-21 DIAGNOSIS — N730 Acute parametritis and pelvic cellulitis: Secondary | ICD-10-CM | POA: Diagnosis present

## 2020-05-21 LAB — POCT URINALYSIS DIPSTICK, ED / UC
Bilirubin Urine: NEGATIVE
Glucose, UA: NEGATIVE mg/dL
Hgb urine dipstick: NEGATIVE
Ketones, ur: NEGATIVE mg/dL
Leukocytes,Ua: NEGATIVE
Nitrite: NEGATIVE
Protein, ur: NEGATIVE mg/dL
Specific Gravity, Urine: 1.02 (ref 1.005–1.030)
Urobilinogen, UA: 0.2 mg/dL (ref 0.0–1.0)
pH: 7 (ref 5.0–8.0)

## 2020-05-21 LAB — POC URINE PREG, ED: Preg Test, Ur: NEGATIVE

## 2020-05-21 MED ORDER — CEFTRIAXONE SODIUM 500 MG IJ SOLR
INTRAMUSCULAR | Status: AC
  Filled 2020-05-21: qty 500

## 2020-05-21 MED ORDER — CEFTRIAXONE SODIUM 500 MG IJ SOLR
500.0000 mg | Freq: Once | INTRAMUSCULAR | Status: AC
  Administered 2020-05-21: 500 mg via INTRAMUSCULAR

## 2020-05-21 MED ORDER — DOXYCYCLINE HYCLATE 100 MG PO TABS: 100.0000 mg | ORAL_TABLET | Freq: Two times a day (BID) | ORAL | 0 refills | Status: AC

## 2020-05-21 MED ORDER — STERILE WATER FOR INJECTION IJ SOLN
INTRAMUSCULAR | Status: AC
  Filled 2020-05-21: qty 10
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
## 2020-05-21 NOTE — ED Triage Notes (Signed)
Pt present vaginal discharge with abdominal pain. The discharge is yellow. Symptoms started 3 days ago.

## 2020-05-21 NOTE — Discharge Instructions (Addendum)
You have been given the following today for treatment of suspected gonorrhea and/or chlamydia:   Ceftriaxone (Rocephin) Injection 500mg   Please pick up your prescription for doxycycline 100mg  ad begin taking twice daily for the next 14 days.   Even though we have treated you today, we have sent testing for sexually transmitted infections. We will notify you of any positive results once they are received. If required, we will prescribe any medication you might need.   Please refrain from all sexual activity for at least the next 14 days.

## 2020-05-24 LAB — CERVICOVAGINAL ANCILLARY ONLY
Bacterial Vaginitis (gardnerella): POSITIVE — AB
Chlamydia: NEGATIVE
Comment: NEGATIVE
Comment: NEGATIVE
Comment: NEGATIVE
Comment: NORMAL
Neisseria Gonorrhea: NEGATIVE
Trichomonas: POSITIVE — AB

## 2020-05-25 ENCOUNTER — Telehealth (HOSPITAL_COMMUNITY): Payer: Self-pay | Admitting: Emergency Medicine

## 2020-05-25 MED ORDER — METRONIDAZOLE 500 MG PO TABS: 500.0000 mg | ORAL_TABLET | Freq: Two times a day (BID) | ORAL | 0 refills | Status: DC

## 2020-06-09 ENCOUNTER — Other Ambulatory Visit: Payer: Self-pay

## 2020-06-09 ENCOUNTER — Ambulatory Visit (HOSPITAL_COMMUNITY)
Admission: EM | Admit: 2020-06-09 | Discharge: 2020-06-09 | Disposition: A | Discharge status: Home / Self Care | Payer: Medicaid Other | Attending: Family Medicine | Admitting: Family Medicine

## 2020-06-09 ENCOUNTER — Encounter (HOSPITAL_COMMUNITY): Payer: Self-pay | Admitting: Emergency Medicine

## 2020-06-09 DIAGNOSIS — Z87820 Personal history of traumatic brain injury: Secondary | ICD-10-CM | POA: Insufficient documentation

## 2020-06-09 DIAGNOSIS — Z87891 Personal history of nicotine dependence: Secondary | ICD-10-CM | POA: Insufficient documentation

## 2020-06-09 DIAGNOSIS — M542 Cervicalgia: Secondary | ICD-10-CM | POA: Diagnosis not present

## 2020-06-09 DIAGNOSIS — R059 Cough, unspecified: Secondary | ICD-10-CM | POA: Diagnosis present

## 2020-06-09 DIAGNOSIS — J069 Acute upper respiratory infection, unspecified: Secondary | ICD-10-CM | POA: Diagnosis not present

## 2020-06-09 DIAGNOSIS — Z20822 Contact with and (suspected) exposure to covid-19: Secondary | ICD-10-CM | POA: Insufficient documentation

## 2020-06-09 LAB — HIV ANTIBODY (ROUTINE TESTING W REFLEX): HIV Screen 4th Generation wRfx: NONREACTIVE

## 2020-06-09 MED ORDER — PSEUDOEPH-BROMPHEN-DM 30-2-10 MG/5ML PO SYRP
5.0000 mL | ORAL_SOLUTION | Freq: Four times a day (QID) | ORAL | 0 refills | Status: DC | PRN
Start: 2020-06-09 — End: 2020-07-03

## 2020-06-09 NOTE — Discharge Instructions (Signed)
You have been tested for COVID-19 today. °If your test returns positive, you will receive a phone call from [facility] regarding your results. °Negative test results are not called. °Both positive and negative results area always visible on MyChart. °If you do not have a MyChart account, sign up instructions are provided in your discharge papers. °Please do not hesitate to contact us should you have questions or concerns. ° °

## 2020-06-09 NOTE — ED Triage Notes (Signed)
Patient presents to urgent care today with symptoms of nasal congestion, cough, and neck pain. Symptoms began on 3 days ago.  She states her mucous was green and then kind of Yousof Alderman. She took a covid test at onset of symptoms and it was negative. They have tried Mucinex  with some relief of symptoms.

## 2020-06-10 LAB — SARS CORONAVIRUS 2 (TAT 6-24 HRS): SARS Coronavirus 2: NEGATIVE

## 2020-06-12 NOTE — ED Provider Notes (Signed)
[facility]ENTER   [id number] 06/09/20 Arrival Time: 1755  ASSESSMENT & PLAN:  1. Upper respiratory tract infection, unspecified type     COVID-19 testing sent. See letter/work note on file for self-isolation guidelines. OTC symptom care as needed.  Meds ordered this encounter  Medications  . brompheniramine-pseudoephedrine-DM 30-2-10 MG/5ML syrup    Sig: Take 5 mLs by mouth 4 (four) times daily as needed.    Dispense:  120 mL    Refill:  0      Reviewed expectations re: course of current medical issues. Questions answered. Outlined signs and symptoms indicating need for more acute intervention. Understanding verbalized. After Visit Summary given.   SUBJECTIVE: History from: patient. Melissa [name] is a 19 y.o. female who reports nasal congestion, cough, neck soreness. 2-3 days. Thick mucous now. Mucinex with some relief. Afebrile. No SOB. Denies: headache. Normal PO intake without n/v/d. Also requests HIV testing.   OBJECTIVE:  Vitals:   06/09/20 1853  BP: 111/72  Pulse: (!) 102  Resp: 16  Temp: 98.5 F (36.9 C)  TempSrc: Oral  SpO2: 100%    General appearance: alert; no distress Eyes: PERRLA; EOMI; conjunctiva normal HENT: [place]; AT; with nasal congestion Neck: supple  Lungs: speaks full sentences without difficulty; unlabored; dry cough Extremities: no edema Skin: warm and dry Neurologic: normal gait Psychological: alert and cooperative; normal mood and affect  Labs:  Labs Reviewed  SARS CORONAVIRUS 2 (TAT 6-24 HRS)  HIV ANTIBODY (ROUTINE TESTING W REFLEX)     Allergies  Allergen Reactions  . Benadryl [Diphenhydramine Hcl] Other (See Comments)    Causes seizures   . Gluten Meal Nausea And Vomiting  . Zithromax [Azithromycin] Anaphylaxis and Swelling  . Lactose Intolerance (Gi) Diarrhea    Past Medical History:  Diagnosis Date  . Anemia   . Anxiety   . Asthma    inhaler. last attack June 2019  . Congenital hydronephrosis  2001  . Constipation   . Depression   . Headache    migraines  . Seizures ([name])    last one in 2015 - on meds  . TBI (traumatic brain injury) ([name]) 2006   Social History   Socioeconomic History  . Marital status: Single    Spouse name: Not on file  . Number of children: Not on file  . Years of education: Not on file  . Highest education level: Not on file  Occupational History  . Not on file  Tobacco Use  . Smoking status: Former Games developer  . Smokeless tobacco: Never Used  . Tobacco comment: black and milds, none since + UPT  Vaping Use  . Vaping Use: Never used  Substance and Sexual Activity  . Alcohol use: Never  . Drug use: Not Currently    Types: Marijuana    Comment: Episodic  . Sexual activity: Yes    Birth control/protection: None  Other Topics Concern  . 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   She attended [employer].    She lives with her parents, siblings, and grandfather.    She enjoys writing, singing, dancing, cooking, and shopping.   She attends [employer].   Social Determinants of Health   Financial Resource Strain:   . Difficulty of Paying Living Expenses: Not on file  Food Insecurity:   . Worried About Programme researcher, broadcasting/film/video in the Last Year: Not on file  . Ran Out of Food in  the Last Year: Not on file  Transportation Needs:   . Lack of Transportation (Medical): Not on file  . Lack of Transportation (Non-Medical): Not on file  Physical Activity:   . Days of Exercise per Week: Not on file  . Minutes of Exercise per Session: Not on file  Stress:   . Feeling of Stress : Not on file  Social Connections:   . Frequency of Communication with Friends and Family: Not on file  . Frequency of Social Gatherings with Friends and Family: Not on file  . Attends Religious Services: Not on file  . Active Member of Clubs or Organizations: Not on file  . Attends Banker Meetings: Not on file  . Marital  Status: Not on file  Intimate Partner Violence:   . Fear of Current or Ex-Partner: Not on file  . Emotionally Abused: Not on file  . Physically Abused: Not on file  . Sexually Abused: Not on file   Family History  Problem Relation Age of Onset  . Depression Mother   . Stroke Mother   . Obesity Mother   . Post-traumatic stress disorder Mother   . Anxiety disorder Mother   . Schizophrenia Father    Past Surgical History:  Procedure Laterality Date  . APPENDECTOMY    . HERNIA REPAIR    . INTESTINAL MALROTATION REPAIR  2001     Mardella Layman, MD 06/12/20 [phone]

## 2020-06-18 ENCOUNTER — Inpatient Hospital Stay (HOSPITAL_COMMUNITY)
Admission: EM | Admit: 2020-06-18 | Discharge: 2020-06-19 | Disposition: A | Discharge status: Home / Self Care | Payer: Medicaid Other | Attending: Obstetrics and Gynecology | Admitting: Obstetrics and Gynecology

## 2020-06-18 ENCOUNTER — Other Ambulatory Visit: Payer: Self-pay

## 2020-06-18 ENCOUNTER — Encounter (HOSPITAL_COMMUNITY): Payer: Self-pay | Admitting: *Deleted

## 2020-06-18 DIAGNOSIS — K5909 Other constipation: Secondary | ICD-10-CM | POA: Diagnosis not present

## 2020-06-18 DIAGNOSIS — Z3A01 Less than 8 weeks gestation of pregnancy: Secondary | ICD-10-CM | POA: Diagnosis not present

## 2020-06-18 DIAGNOSIS — Z87891 Personal history of nicotine dependence: Secondary | ICD-10-CM | POA: Insufficient documentation

## 2020-06-18 DIAGNOSIS — O26891 Other specified pregnancy related conditions, first trimester: Secondary | ICD-10-CM | POA: Insufficient documentation

## 2020-06-18 DIAGNOSIS — O99611 Diseases of the digestive system complicating pregnancy, first trimester: Secondary | ICD-10-CM | POA: Insufficient documentation

## 2020-06-18 DIAGNOSIS — K5900 Constipation, unspecified: Secondary | ICD-10-CM

## 2020-06-18 DIAGNOSIS — R1030 Lower abdominal pain, unspecified: Secondary | ICD-10-CM | POA: Insufficient documentation

## 2020-06-18 LAB — URINALYSIS, ROUTINE W REFLEX MICROSCOPIC
Bilirubin Urine: NEGATIVE
Glucose, UA: NEGATIVE mg/dL
Hgb urine dipstick: NEGATIVE
Ketones, ur: NEGATIVE mg/dL
Nitrite: NEGATIVE
Protein, ur: NEGATIVE mg/dL
Specific Gravity, Urine: 1.005 (ref 1.005–1.030)
pH: 7 (ref 5.0–8.0)

## 2020-06-18 LAB — CBC
HCT: 36.8 % (ref 36.0–46.0)
Hemoglobin: 12 g/dL (ref 12.0–15.0)
MCH: 27.1 pg (ref 26.0–34.0)
MCHC: 32.6 g/dL (ref 30.0–36.0)
MCV: 83.1 fL (ref 80.0–100.0)
Platelets: 256 10*3/uL (ref 150–400)
RBC: 4.43 MIL/uL (ref 3.87–5.11)
RDW: 14.3 % (ref 11.5–15.5)
WBC: 7.3 10*3/uL (ref 4.0–10.5)
nRBC: 0 % (ref 0.0–0.2)

## 2020-06-18 NOTE — ED Triage Notes (Signed)
The pt came in ambulatory from ems.  C/o abd pain for 2 days  Nauseated  The paramedic reports that she is here to confirm her home pregnancy test that was positive  lmp sept  1st

## 2020-06-19 ENCOUNTER — Encounter (HOSPITAL_COMMUNITY): Payer: Self-pay | Admitting: Obstetrics and Gynecology

## 2020-06-19 ENCOUNTER — Other Ambulatory Visit: Payer: Self-pay

## 2020-06-19 DIAGNOSIS — K5900 Constipation, unspecified: Secondary | ICD-10-CM

## 2020-06-19 DIAGNOSIS — Z3A01 Less than 8 weeks gestation of pregnancy: Secondary | ICD-10-CM

## 2020-06-19 DIAGNOSIS — O99611 Diseases of the digestive system complicating pregnancy, first trimester: Secondary | ICD-10-CM

## 2020-06-19 LAB — COMPREHENSIVE METABOLIC PANEL
ALT: 11 U/L (ref 0–44)
AST: 13 U/L — ABNORMAL LOW (ref 15–41)
Albumin: 3.7 g/dL (ref 3.5–5.0)
Alkaline Phosphatase: 57 U/L (ref 38–126)
Anion gap: 10 (ref 5–15)
BUN: 10 mg/dL (ref 6–20)
CO2: 23 mmol/L (ref 22–32)
Calcium: 9.4 mg/dL (ref 8.9–10.3)
Chloride: 103 mmol/L (ref 98–111)
Creatinine, Ser: 0.7 mg/dL (ref 0.44–1.00)
GFR, Estimated: >60 mL/min (ref >60)
Glucose, Bld: 79 mg/dL (ref 70–99)
Potassium: 4 mmol/L (ref 3.5–5.1)
Sodium: 136 mmol/L (ref 135–145)
Total Bilirubin: 0.6 mg/dL (ref 0.3–1.2)
Total Protein: 7 g/dL (ref 6.5–8.1)

## 2020-06-19 LAB — LIPASE, BLOOD: Lipase: 36 U/L (ref 11–51)

## 2020-06-19 LAB — I-STAT BETA HCG BLOOD, ED (MC, WL, AP ONLY): I-stat hCG, quantitative: >2000 m[IU]/mL — ABNORMAL HIGH (ref <5)

## 2020-06-19 NOTE — ED Notes (Signed)
Transported to MAU 

## 2020-06-19 NOTE — MAU Note (Addendum)
PT SAYS HAD CYCLE 9-29, 8-22, 7-9. Marland Kitchen  PT CAME TO [place] -AFTER  WORKING AT [employer] -  TONIGHT BC PAIN MIDDLE OF ABD  AND WHEN WALKS - SHE FEELS RIGHT LOWER ABD CRAMPS . LAST SEX- Tuesday. NO BC.

## 2020-06-19 NOTE — MAU Provider Note (Signed)
History     CSN: [id number]  Arrival date and time: 06/18/20 2318   None     Chief Complaint  Patient presents with  . Abdominal Pain   HPI  Patient is a G2P1 at approx ~4wk by LMP who presents as an ER transfer with abdominal cramping. Recently found out she was pregnant 3 days ago. She denies vaginal bleeding. Reports mild intermittent cramping that is generalized. It is mild and has improved since getting to ER. Has not taken medications for it. Reports she has not had a bowel movement in 3-4 days. Passing gas. No dysuria. No back pain. No fevers, chills.    OB History    Gravida  2   Para  1   Term  1   Preterm      AB      Living  1     SAB      TAB      Ectopic      Multiple  0   Live Births  1           Past Medical History:  Diagnosis Date  . Anemia   . Anxiety   . Asthma    inhaler. last attack June 2019  . Congenital hydronephrosis 2001  . Constipation   . Depression   . Headache    migraines  . Seizures ([name])    last one in 2015 - on meds  . TBI (traumatic brain injury) ([name]) 2006    Past Surgical History:  Procedure Laterality Date  . APPENDECTOMY    . HERNIA REPAIR    . INTESTINAL MALROTATION REPAIR  2001    Family History  Problem Relation Age of Onset  . Depression Mother   . Stroke Mother   . Obesity Mother   . Post-traumatic stress disorder Mother   . Anxiety disorder Mother   . Schizophrenia Father     Social History   Tobacco Use  . Smoking status: Former Games developer  . Smokeless tobacco: Never Used  . Tobacco comment: black and milds, none since + UPT  Vaping Use  . Vaping Use: Never used  Substance Use Topics  . Alcohol use: Never  . Drug use: Not Currently    Types: Marijuana    Comment: last smoked  IN AUG    Allergies:  Allergies  Allergen Reactions  . Benadryl [Diphenhydramine Hcl] Other (See Comments)    Causes seizures   . Gluten Meal Nausea And Vomiting  . Zithromax [Azithromycin]  Anaphylaxis and Swelling  . Lactose Intolerance (Gi) Diarrhea    No medications prior to admission.    Review of Systems  Constitutional: Negative for chills, fatigue and fever.  Cardiovascular: Negative for chest pain.  Gastrointestinal: Positive for constipation. Negative for diarrhea, nausea and vomiting.  Genitourinary: Negative for dysuria, flank pain, frequency and pelvic pain.  Musculoskeletal: Negative for back pain.  Neurological: Negative for dizziness.   Physical Exam   Blood pressure 116/72, pulse 84, temperature 98.4 F (36.9 C), temperature source Oral, resp. rate 20, height 5\' 7"  (1.702 m), weight 80.3 kg, last menstrual period 05/19/2020, SpO2 100 %, not currently breastfeeding.  Physical Exam Vitals and nursing note reviewed.  Constitutional:      Appearance: She is well-developed.  Cardiovascular:     Rate and Rhythm: Normal rate and regular rhythm.  Abdominal:     General: Bowel sounds are normal.     Tenderness: There is no abdominal tenderness. There is no  guarding or rebound.     Comments: nontender to palpation. She points to umbilical region as where she is feeling cramps   Skin:    General: Skin is warm and dry.  Neurological:     Mental Status: She is alert.     MAU Course  Procedures  MDM Pt transferred from ER, where she already had workup including CBC, CMP, HCG, UA.  Labs notable for normal CMP, HCG 2000, UA neg nitrites, neg Leukocytes.  VSS, unremarkable Physical Exam Dc home   Assessment and Plan   Constipation, early pregnancy -discussed hydration, stool softeners, fiber -provided return precautions -stable for d/c home   Gita Kudo 06/19/2020, 2:54 AM

## 2020-06-19 NOTE — ED Provider Notes (Signed)
[facility] EMERGENCY DEPARTMENT Provider Note   CSN: [id number] Arrival date & time: 06/18/20  2318     History Chief Complaint  Patient presents with  . Abdominal Pain    Melissa [name] is a 19 y.o. female.  The history is provided by the patient and medical records.   19 y.o. G1P1 with LMP first week of September, hx of anemia, depression, anxiety, headaches, presenting to the ED with lower abdominal cramping.  States she took a home UPT that was + 3 days ago.  She denies vaginal bleeding but has noticed some discharge.  Has has nausea but denies vomiting.  Is eating/drinking in small amounts.  Denies fever/chills/rash.  States no complications with first pregnancy until delivery (cord issues).  She is not currently established with OB-GYN.    Past Medical History:  Diagnosis Date  . Anemia   . Anxiety   . Asthma    inhaler. last attack June 2019  . Congenital hydronephrosis 2001  . Constipation   . Depression   . Headache    migraines  . Seizures ([name])    last one in 2015 - on meds  . TBI (traumatic brain injury) [facility]) 2006    Patient Active Problem List   Diagnosis Date Noted  . History of seizures 05/07/2020  . Normal labor 08/20/2019  . Migraine with aura and without status migrainosus, not intractable 08/12/2019  . Seizures ([name])   . Inadequate oral nutritional intake 04/27/2019  . Sickle cell trait ([name]) 02/13/2019  . Rh negative state in antepartum period 02/05/2019  . Supervision of high risk pregnancy, antepartum 02/05/2019  . 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 DMDD (disruptive mood dysregulation disorder) ([name]) 07/21/2016  . 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 Bilateral low back pain without sciatica 03/08/2016  . Acne vulgaris 08/03/2015  . Chronic constipation 08/03/2015  . 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  Migraine without aura and without status migrainosus, not intractable 04/16/2015  . Episodic tension-type headache, not intractable 04/16/2015  . 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 GAD (generalized anxiety disorder) 11/25/2013  . Suicide attempt by drug ingestion ([name]) 11/18/2013    Past Surgical History:  Procedure Laterality Date  . APPENDECTOMY    . HERNIA REPAIR    . INTESTINAL MALROTATION REPAIR  2001     OB History    Gravida  1   Para  1   Term  1   Preterm      AB      Living  1     SAB      TAB      Ectopic      Multiple  0   Live Births  1           Family History  Problem Relation Age of Onset  . Depression Mother   . Stroke Mother   . Obesity Mother   . Post-traumatic stress disorder Mother   . Anxiety disorder Mother   . Schizophrenia Father     Social History   Tobacco Use  . Smoking status: Former Games developer  . Smokeless tobacco: Never Used  . Tobacco comment: black and milds, none since + UPT  Vaping Use  . Vaping Use: Never used  Substance Use Topics  . Alcohol use: Never  . Drug use: Not Currently    Types: Marijuana    Comment: Episodic  Home Medications Prior to Admission medications   Medication Sig Start Date End Date Taking? Authorizing Provider  brompheniramine-pseudoephedrine-DM 30-2-10 MG/5ML syrup Take 5 mLs by mouth 4 (four) times daily as needed. 06/09/20   Mardella Layman, MD  cetirizine (ZYRTEC) 10 MG tablet TAKE 1 TABLET BY MOUTH EVERY DAY AS NEEDED FOR ALLERGY 01/23/20   Florestine Avers, [place], MD  ferrous sulfate (IRON SUPPLEMENT) 325 (65 FE) MG tablet Take 325 mg by mouth daily with breakfast.    [provider]  rizatriptan (MAXALT-MLT) 10 MG disintegrating tablet Take 1 tablet at onset of migraine with 2 ibuprofen may repeat an additional tablet in 2 hours if needed 05/07/20 06/09/20  Deetta Perla, MD    Allergies    Benadryl [diphenhydramine hcl], Gluten meal, Zithromax [azithromycin], and  Lactose intolerance (gi)  Review of Systems   Review of Systems  Gastrointestinal: Positive for abdominal pain (cramping).  Genitourinary: Positive for vaginal discharge.  All other systems reviewed and are negative.   Physical Exam Updated Vital Signs BP 128/72 (BP Location: Right Arm)   Pulse 85   Temp 98.4 F (36.9 C) (Oral)   Resp 17   Ht 5\' 7"  (1.702 m)   Wt 78 kg   LMP 05/19/2020   SpO2 100%   BMI 26.93 kg/m   Physical Exam Vitals and nursing note reviewed.  Constitutional:      Appearance: She is well-developed.  HENT:     Head: Normocephalic and atraumatic.  Eyes:     Conjunctiva/sclera: Conjunctivae normal.     Pupils: Pupils are equal, round, and reactive to light.  Cardiovascular:     Rate and Rhythm: Normal rate and regular rhythm.     Heart sounds: Normal heart sounds.  Pulmonary:     Effort: Pulmonary effort is normal.     Breath sounds: Normal breath sounds.  Abdominal:     General: Bowel sounds are normal.     Palpations: Abdomen is soft.     Comments: Soft, grossly non-tender  Musculoskeletal:        General: Normal range of motion.     Cervical back: Normal range of motion.  Skin:    General: Skin is warm and dry.  Neurological:     Mental Status: She is alert and oriented to person, place, and time.     ED Results / Procedures / Treatments   Labs (all labs ordered are listed, but only abnormal results are displayed) Labs Reviewed  COMPREHENSIVE METABOLIC PANEL - Abnormal; Notable for the following components:      Result Value   AST 13 (*)    All other components within normal limits  URINALYSIS, ROUTINE W REFLEX MICROSCOPIC - Abnormal; Notable for the following components:   APPearance HAZY (*)    Leukocytes,Ua MODERATE (*)    Bacteria, UA RARE (*)    All other components within normal limits  I-STAT BETA HCG BLOOD, ED ([facility], [facility], [facility] ONLY) - Abnormal; Notable for the following components:   I-stat hCG, quantitative >2,000.0 (*)     All other components within normal limits  LIPASE, BLOOD  CBC    EKG None  Radiology No results found.  Procedures Procedures (including critical care time)  Medications Ordered in ED Medications - No data to display  ED Course  I have reviewed the triage vital signs and the nursing notes.  Pertinent labs & imaging results that were available during my care of the patient were reviewed by me and considered in  my medical decision making (see chart for details).    MDM Rules/Calculators/A&P  19 y.o. G2P1 with LMP first week of September here with abdominal cramping, vaginal discharge and nausea.  Home UPT + 3 days ago.  She is afebrile, non-toxic.  She is tolerating PO, HD stable.  Labs grossly reassuring.  I-stat hcg >2000.    Spoke with Dr. Donavan Foil at [facility], will transfer for further evaluation.  Final Clinical Impression(s) / ED Diagnoses Final diagnoses:  Abdominal pain during pregnancy in first trimester    Rx / DC Orders ED Discharge Orders    None       Garlon Hatchet, PA-C 06/19/20 0123    Palumbo, April, MD 06/19/20 [phone]

## 2020-07-01 ENCOUNTER — Encounter (HOSPITAL_COMMUNITY): Payer: Self-pay

## 2020-07-01 ENCOUNTER — Emergency Department (HOSPITAL_COMMUNITY)
Admission: EM | Admit: 2020-07-01 | Discharge: 2020-07-02 | Disposition: A | Discharge status: Home / Self Care | Payer: Medicaid Other | Attending: Emergency Medicine | Admitting: Emergency Medicine

## 2020-07-01 ENCOUNTER — Other Ambulatory Visit: Payer: Self-pay

## 2020-07-01 DIAGNOSIS — M545 Low back pain, unspecified: Secondary | ICD-10-CM | POA: Diagnosis present

## 2020-07-01 DIAGNOSIS — Z5321 Procedure and treatment not carried out due to patient leaving prior to being seen by health care provider: Secondary | ICD-10-CM | POA: Insufficient documentation

## 2020-07-01 DIAGNOSIS — R109 Unspecified abdominal pain: Secondary | ICD-10-CM | POA: Diagnosis not present

## 2020-07-01 DIAGNOSIS — R300 Dysuria: Secondary | ICD-10-CM | POA: Diagnosis not present

## 2020-07-01 LAB — URINALYSIS, ROUTINE W REFLEX MICROSCOPIC
Bacteria, UA: NONE SEEN
Bilirubin Urine: NEGATIVE
Glucose, UA: NEGATIVE mg/dL
Hgb urine dipstick: NEGATIVE
Ketones, ur: NEGATIVE mg/dL
Nitrite: NEGATIVE
Protein, ur: NEGATIVE mg/dL
Specific Gravity, Urine: 1.008 (ref 1.005–1.030)
pH: 5 (ref 5.0–8.0)

## 2020-07-01 LAB — CBC
HCT: 38.5 % (ref 36.0–46.0)
Hemoglobin: 12.7 g/dL (ref 12.0–15.0)
MCH: 26.7 pg (ref 26.0–34.0)
MCHC: 33 g/dL (ref 30.0–36.0)
MCV: 81.1 fL (ref 80.0–100.0)
Platelets: 228 10*3/uL (ref 150–400)
RBC: 4.75 MIL/uL (ref 3.87–5.11)
RDW: 14.6 % (ref 11.5–15.5)
WBC: 6.8 10*3/uL (ref 4.0–10.5)
nRBC: 0 % (ref 0.0–0.2)

## 2020-07-01 LAB — I-STAT BETA HCG BLOOD, ED (MC, WL, AP ONLY): I-stat hCG, quantitative: >2000 m[IU]/mL — ABNORMAL HIGH (ref <5)

## 2020-07-01 NOTE — ED Triage Notes (Signed)
Pt arrives to ED w/ c/o 8/10 lower back pain and abdominal pain x 2 days. Pt endorses dysuria, denies hematuria. Pt denies n/v/d.

## 2020-07-02 LAB — COMPREHENSIVE METABOLIC PANEL
ALT: 10 U/L (ref 0–44)
AST: 15 U/L (ref 15–41)
Albumin: 3.8 g/dL (ref 3.5–5.0)
Alkaline Phosphatase: 56 U/L (ref 38–126)
Anion gap: 9 (ref 5–15)
BUN: 10 mg/dL (ref 6–20)
CO2: 25 mmol/L (ref 22–32)
Calcium: 9.9 mg/dL (ref 8.9–10.3)
Chloride: 102 mmol/L (ref 98–111)
Creatinine, Ser: 0.68 mg/dL (ref 0.44–1.00)
GFR, Estimated: >60 mL/min (ref >60)
Glucose, Bld: 102 mg/dL — ABNORMAL HIGH (ref 70–99)
Potassium: 3.6 mmol/L (ref 3.5–5.1)
Sodium: 136 mmol/L (ref 135–145)
Total Bilirubin: 0.6 mg/dL (ref 0.3–1.2)
Total Protein: 7.2 g/dL (ref 6.5–8.1)

## 2020-07-02 LAB — LIPASE, BLOOD: Lipase: 34 U/L (ref 11–51)

## 2020-07-02 NOTE — ED Notes (Signed)
Pt called for room. No response x3.

## 2020-07-03 ENCOUNTER — Encounter (HOSPITAL_COMMUNITY): Payer: Self-pay | Admitting: Obstetrics and Gynecology

## 2020-07-03 ENCOUNTER — Inpatient Hospital Stay (HOSPITAL_COMMUNITY): Payer: Medicaid Other

## 2020-07-03 ENCOUNTER — Other Ambulatory Visit: Payer: Self-pay

## 2020-07-03 ENCOUNTER — Inpatient Hospital Stay (HOSPITAL_COMMUNITY)
Admission: AD | Admit: 2020-07-03 | Discharge: 2020-07-03 | Disposition: A | Discharge status: Home / Self Care | Payer: Medicaid Other | Attending: Obstetrics and Gynecology | Admitting: Obstetrics and Gynecology

## 2020-07-03 DIAGNOSIS — Z87820 Personal history of traumatic brain injury: Secondary | ICD-10-CM | POA: Diagnosis not present

## 2020-07-03 DIAGNOSIS — O26891 Other specified pregnancy related conditions, first trimester: Secondary | ICD-10-CM | POA: Diagnosis not present

## 2020-07-03 DIAGNOSIS — O99511 Diseases of the respiratory system complicating pregnancy, first trimester: Secondary | ICD-10-CM | POA: Insufficient documentation

## 2020-07-03 DIAGNOSIS — R109 Unspecified abdominal pain: Secondary | ICD-10-CM | POA: Insufficient documentation

## 2020-07-03 DIAGNOSIS — O98311 Other infections with a predominantly sexual mode of transmission complicating pregnancy, first trimester: Secondary | ICD-10-CM | POA: Diagnosis not present

## 2020-07-03 DIAGNOSIS — A5901 Trichomonal vulvovaginitis: Secondary | ICD-10-CM | POA: Diagnosis not present

## 2020-07-03 DIAGNOSIS — Z87891 Personal history of nicotine dependence: Secondary | ICD-10-CM | POA: Insufficient documentation

## 2020-07-03 DIAGNOSIS — Z3A01 Less than 8 weeks gestation of pregnancy: Secondary | ICD-10-CM

## 2020-07-03 DIAGNOSIS — J45909 Unspecified asthma, uncomplicated: Secondary | ICD-10-CM | POA: Diagnosis not present

## 2020-07-03 DIAGNOSIS — O209 Hemorrhage in early pregnancy, unspecified: Secondary | ICD-10-CM | POA: Diagnosis not present

## 2020-07-03 DIAGNOSIS — O4691 Antepartum hemorrhage, unspecified, first trimester: Secondary | ICD-10-CM

## 2020-07-03 LAB — URINALYSIS, ROUTINE W REFLEX MICROSCOPIC
Bilirubin Urine: NEGATIVE
Glucose, UA: NEGATIVE mg/dL
Hgb urine dipstick: NEGATIVE
Ketones, ur: NEGATIVE mg/dL
Nitrite: NEGATIVE
Protein, ur: NEGATIVE mg/dL
Specific Gravity, Urine: 1.014 (ref 1.005–1.030)
pH: 5 (ref 5.0–8.0)

## 2020-07-03 LAB — HCG, QUANTITATIVE, PREGNANCY: hCG, Beta Chain, Quant, S: 128008 m[IU]/mL — ABNORMAL HIGH (ref <5)

## 2020-07-03 LAB — CBC
HCT: 34.2 % — ABNORMAL LOW (ref 36.0–46.0)
Hemoglobin: 11.4 g/dL — ABNORMAL LOW (ref 12.0–15.0)
MCH: 26.9 pg (ref 26.0–34.0)
MCHC: 33.3 g/dL (ref 30.0–36.0)
MCV: 80.7 fL (ref 80.0–100.0)
Platelets: 201 10*3/uL (ref 150–400)
RBC: 4.24 MIL/uL (ref 3.87–5.11)
RDW: 14.6 % (ref 11.5–15.5)
WBC: 7.2 10*3/uL (ref 4.0–10.5)
nRBC: 0 % (ref 0.0–0.2)

## 2020-07-03 LAB — HIV ANTIBODY (ROUTINE TESTING W REFLEX): HIV Screen 4th Generation wRfx: NONREACTIVE

## 2020-07-03 MED ORDER — ONDANSETRON 4 MG PO TBDP
4.0000 mg | ORAL_TABLET | Freq: Once | ORAL | Status: AC
  Administered 2020-07-03: 4 mg via ORAL
  Filled 2020-07-03: qty 1

## 2020-07-03 MED ORDER — METRONIDAZOLE 500 MG PO TABS
2000.0000 mg | ORAL_TABLET | Freq: Once | ORAL | Status: AC
  Administered 2020-07-03: 2000 mg via ORAL
  Filled 2020-07-03: qty 4

## 2020-07-03 MED ORDER — RHO D IMMUNE GLOBULIN 1500 UNIT/2ML IJ SOSY
300.0000 ug | PREFILLED_SYRINGE | Freq: Once | INTRAMUSCULAR | Status: AC
  Administered 2020-07-03: 300 ug via INTRAMUSCULAR
  Filled 2020-07-03: qty 2

## 2020-07-03 NOTE — MAU Provider Note (Signed)
Chief Complaint: Abdominal Pain and Vaginal Bleeding   First Provider Initiated Contact with Patient 07/03/20 0227      SUBJECTIVE HPI: Melissa [name] is a 19 y.o. G2P1001 at [redacted]w[redacted]d by LMP who presents to maternity admissions reporting cramping and spotting while at work today. She was diagnosed with trichomonas 1 month ago and started her medications this week.  She reports some associated left shoulder pain and was concerned about ectopic pregnancy. She has had hcg levels in the emergency room and is concerned that they were both the same so is not sure about the status of her pregnancy.   Location: lower abdomen, right side more than left but bilateral Quality: cramping Severity: 6/10 on pain scale Duration: 1-2 days Timing: intermittent Modifying factors: none, pt just started tx for trichomonas Associated signs and symptoms: vaginal spotting, nausea  HPI  Past Medical History:  Diagnosis Date  . Anemia   . Anxiety   . Asthma    inhaler. last attack June 2019  . Congenital hydronephrosis 2001  . Constipation   . Depression   . Headache    migraines  . Seizures ([name])    last one in 2015 - on meds  . TBI (traumatic brain injury) ([name]) 2006   Past Surgical History:  Procedure Laterality Date  . APPENDECTOMY    . HERNIA REPAIR    . INTESTINAL MALROTATION REPAIR  2001   Social History   Socioeconomic History  . Marital status: Single    Spouse name: Not on file  . Number of children: Not on file  . Years of education: Not on file  . Highest education level: Not on file  Occupational History  . Not on file  Tobacco Use  . Smoking status: Former Games developer  . Smokeless tobacco: Never Used  . Tobacco comment: black and milds, none since + UPT  Vaping Use  . Vaping Use: Never used  Substance and Sexual Activity  . Alcohol use: Never  . Drug use: Not Currently    Types: Marijuana    Comment: last smoked  IN AUG  . Sexual activity: Yes    Birth  control/protection: None  Other Topics Concern  . 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   She attended [employer].    She lives with her parents, siblings, and grandfather.    She enjoys writing, singing, dancing, cooking, and shopping.   She attends [employer].   Social Determinants of Health   Financial Resource Strain:   . Difficulty of Paying Living Expenses: Not on file  Food Insecurity:   . Worried About Programme researcher, broadcasting/film/video in the Last Year: Not on file  . Ran Out of Food in the Last Year: Not on file  Transportation Needs:   . Lack of Transportation (Medical): Not on file  . Lack of Transportation (Non-Medical): Not on file  Physical Activity:   . Days of Exercise per Week: Not on file  . Minutes of Exercise per Session: Not on file  Stress:   . Feeling of Stress : Not on file  Social Connections:   . Frequency of Communication with Friends and Family: Not on file  . Frequency of Social Gatherings with Friends and Family: Not on file  . Attends Religious Services: Not on file  . Active Member of Clubs or Organizations: Not on file  . Attends Banker Meetings: Not on file  .  Marital Status: Not on file  Intimate Partner Violence:   . Fear of Current or Ex-Partner: Not on file  . Emotionally Abused: Not on file  . Physically Abused: Not on file  . Sexually Abused: Not on file   No current facility-administered medications on file prior to encounter.   Current Outpatient Medications on File Prior to Encounter  Medication Sig Dispense Refill  . acetaminophen (TYLENOL) 500 MG tablet Take 500 mg by mouth every 6 (six) hours as needed.    . brompheniramine-pseudoephedrine-DM 30-2-10 MG/5ML syrup Take 5 mLs by mouth 4 (four) times daily as needed. 120 mL 0  . cetirizine (ZYRTEC) 10 MG tablet TAKE 1 TABLET BY MOUTH EVERY DAY AS NEEDED FOR ALLERGY 30 tablet 11  . ferrous sulfate (IRON SUPPLEMENT) 325 (65 FE) MG  tablet Take 325 mg by mouth daily with breakfast.    . Prenatal Vit-Fe Fumarate-FA (MULTIVITAMIN-PRENATAL) 27-0.8 MG TABS tablet Take 1 tablet by mouth daily at 12 noon.    . [DISCONTINUED] rizatriptan (MAXALT-MLT) 10 MG disintegrating tablet Take 1 tablet at onset of migraine with 2 ibuprofen may repeat an additional tablet in 2 hours if needed 10 tablet 5   Allergies  Allergen Reactions  . Benadryl [Diphenhydramine Hcl] Other (See Comments)    Causes seizures   . Gluten Meal Nausea And Vomiting  . Zithromax [Azithromycin] Anaphylaxis and Swelling  . Lactose Intolerance (Gi) Diarrhea    ROS:  Review of Systems  Constitutional: Negative for chills, fatigue and fever.  Respiratory: Negative for shortness of breath.   Cardiovascular: Negative for chest pain.  Gastrointestinal: Positive for abdominal pain.  Genitourinary: Positive for vaginal bleeding. Negative for difficulty urinating, dysuria, flank pain, pelvic pain, vaginal discharge and vaginal pain.  Neurological: Negative for dizziness and headaches.  Psychiatric/Behavioral: Negative.      I have reviewed patient's Past Medical Hx, Surgical Hx, Family Hx, Social Hx, medications and allergies.   Physical Exam   Patient Vitals for the past 24 hrs:  BP Temp Pulse Resp Weight  07/03/20 0145 (!) 103/59 98.3 F (36.8 C) 84 17 79.6 kg   Constitutional: Well-developed, well-nourished female in no acute distress.  Cardiovascular: normal rate Respiratory: normal effort GI: Abd soft, non-tender. Pos BS x 4 MS: Extremities nontender, no edema, normal ROM Neurologic: Alert and oriented x 4.  GU: Neg CVAT.  PELVIC EXAM: deferred   LAB RESULTS Results for orders placed or performed during the hospital encounter of 07/03/20 (from the past 24 hour(s))  Urinalysis, Routine w reflex microscopic Urine, Clean Catch     Status: Abnormal   Collection Time: 07/03/20  2:00 AM  Result Value Ref Range   Color, Urine YELLOW YELLOW    APPearance HAZY (A) CLEAR   Specific Gravity, Urine 1.014 1.005 - 1.030   pH 5.0 5.0 - 8.0   Glucose, UA NEGATIVE NEGATIVE mg/dL   Hgb urine dipstick NEGATIVE NEGATIVE   Bilirubin Urine NEGATIVE NEGATIVE   Ketones, ur NEGATIVE NEGATIVE mg/dL   Protein, ur NEGATIVE NEGATIVE mg/dL   Nitrite NEGATIVE NEGATIVE   Leukocytes,Ua MODERATE (A) NEGATIVE   RBC / HPF 0-5 0 - 5 RBC/hpf   WBC, UA 11-20 0 - 5 WBC/hpf   Bacteria, UA RARE (A) NONE SEEN   Squamous Epithelial / LPF 11-20 0 - 5   Mucus PRESENT   CBC     Status: Abnormal   Collection Time: 07/03/20  2:49 AM  Result Value Ref Range   WBC 7.2 4.0 -  10.5 K/uL   RBC 4.24 3.87 - 5.11 MIL/uL   Hemoglobin 11.4 (L) 12.0 - 15.0 g/dL   HCT 40.934.2 (L) 36 - 46 %   MCV 80.7 80.0 - 100.0 fL   MCH 26.9 26.0 - 34.0 pg   MCHC 33.3 30.0 - 36.0 g/dL   RDW 81.114.6 91.411.5 - 78.215.5 %   Platelets 201 150 - 400 K/uL   nRBC 0.0 0.0 - 0.2 %  hCG, quantitative, pregnancy     Status: Abnormal   Collection Time: 07/03/20  2:49 AM  Result Value Ref Range   hCG, Beta Chain, Quant, S 128,008 (H) <5 mIU/mL  Rh IG workup (includes ABO/Rh)     Status: None (Preliminary result)   Collection Time: 07/03/20  2:49 AM  Result Value Ref Range   Gestational Age(Wks) 6    ABO/RH(D) O NEG    Antibody Screen NEG    Unit Number [id number]    Blood Component Type RHIG    Unit division 00    Status of Unit ISSUED    Transfusion Status      OK TO TRANSFUSE Performed at [facility], [address] [place], [place] [address]     --/--/O NEG (11/13 0249)  IMAGING US OB Comp Less 14 Wks  Result Date: 07/03/2020 CLINICAL DATA:  Bleeding EXAM: OBSTETRIC <14 WK ULTRASOUND TECHNIQUE: Transabdominal ultrasound was performed for evaluation of the gestation as well as the maternal uterus and adnexal regions. COMPARISON:  None. FINDINGS: Intrauterine gestational sac: Single Yolk sac:  Visualized. Embryo:  Visualized. Cardiac Activity: Visualized. Heart Rate: 140 bpm CRL:   15.3 mm 7 w 6 d                  US EDC: 02/13/2021 Subchorionic hemorrhage:  None visualized. Maternal uterus/adnexae: There is no significant maternal abnormality. IMPRESSION: Single live IUP at 7 weeks and 6 days. Electronically Signed   By: Katherine Mantlehristopher  Green M.D.   On: 07/03/2020 03:14    MAU Management/MDM: Orders Placed This Encounter  Procedures  . US OB Comp Less 14 Wks  . Urinalysis, Routine w reflex microscopic Urine, Clean Catch  . CBC  . hCG, quantitative, pregnancy  . HIV Antibody (routine testing w rflx)  . Rh IG workup (includes ABO/Rh)  . Discharge patient    Meds ordered this encounter  Medications  . rho (d) immune globulin (RHIG/RHOPHYLAC) injection 300 mcg  . metroNIDAZOLE (FLAGYL) tablet 2,000 mg  . ondansetron (ZOFRAN-ODT) disintegrating tablet 4 mg    IUP on today's US. Bleeding and cramping likely associated with trichomonas. Pt unable to tolerate 7 days of Flagyl so single dose given In MAU today with Zofran single dose.  Pt tolerated well.  . Expedited partner therapy for one female partner, Genene ChurnBrandon Holloway.  Pt will need TOC at new OB visit.  List of [facility] given.  Return to MAU as needed for emergencies.    ASSESSMENT 1. Trichomonal vaginitis during pregnancy in first trimester   2. Vaginal bleeding in pregnancy, first trimester   3. Abdominal pain during pregnancy in first trimester     PLAN Discharge home Allergies as of 07/03/2020      Reactions   Benadryl [diphenhydramine Hcl] Other (See Comments)   Causes seizures   Gluten Meal Nausea And Vomiting   Zithromax [azithromycin] Anaphylaxis, Swelling   Lactose Intolerance (gi) Diarrhea      Medication List    STOP taking these medications   brompheniramine-pseudoephedrine-DM  30-2-10 MG/5ML syrup     TAKE these medications   acetaminophen 500 MG tablet Commonly known as: TYLENOL Take 500 mg by mouth every 6 (six) hours as needed.   cetirizine 10 MG tablet Commonly known  as: ZYRTEC TAKE 1 TABLET BY MOUTH EVERY DAY AS NEEDED FOR ALLERGY   Iron Supplement 325 (65 FE) MG tablet Generic drug: ferrous sulfate Take 325 mg by mouth daily with breakfast.   multivitamin-prenatal 27-0.8 MG Tabs tablet Take 1 tablet by mouth daily at 12 noon.       Follow-up Information    Prenatal provider of your choice Follow up.   Why: See list provided              Sharen Counter Certified Nurse-Midwife 07/03/2020  4:19 AM

## 2020-07-03 NOTE — Discharge Instructions (Signed)
[facility] Prenatal Care Providers          [facility] locations:  Hours may vary. All offices accept Medicaid.  Please call for an appointment.  [facility] @ [facility] (Will make appointments with insurance pending or no insurance)  [address], [place] [phone]  [facility] @ [facility](Will make appointments with insurance pending or no insurance)  [address], [place] [phone]  [facility] @ [facility]   [address], [place]  [phone]  [facility] @ [facility] [facility]       [address], [place], [place] [phone]            [facility] @ [place] [facility]     [address], [place], [place] [phone]          [facility] @ [place]   [address], [place], [place] [phone]     [facility] @ [facility])  [address] , ,   [phone]   

## 2020-07-03 NOTE — MAU Note (Signed)
Patient reports spotting that started yesterday at work.  States her hcg levels haven't been rising like they should.  Also reports right sided lower abdominal pain and shoulder pain.

## 2020-07-04 LAB — RH IG WORKUP (INCLUDES ABO/RH)
ABO/RH(D): O NEG
Antibody Screen: NEGATIVE
Gestational Age(Wks): 6
Unit division: 0

## 2020-07-05 NOTE — MAU Provider Note (Addendum)
Addendum to MAU note 07/03/20: Rhophylac 300 mcg was administered IM on 07/03/20, during pt MAU visit due to vaginal bleeding in early pregnancy.  IUP was noted on [place] 07/03/20 and pt to follow up with early prenatal care and TOC for trichomonas, which was treated 07/03/20.

## 2020-07-10 IMAGING — US US MFM FETAL BPP W/O NON-STRESS
1 series · 8 of 8 positions shown · non-contrast
Comparison: none

[Series 1: us mfm fetal bpp w/o non-stress · 8 acquisitions, 8 frames shown]
[im 1/8]
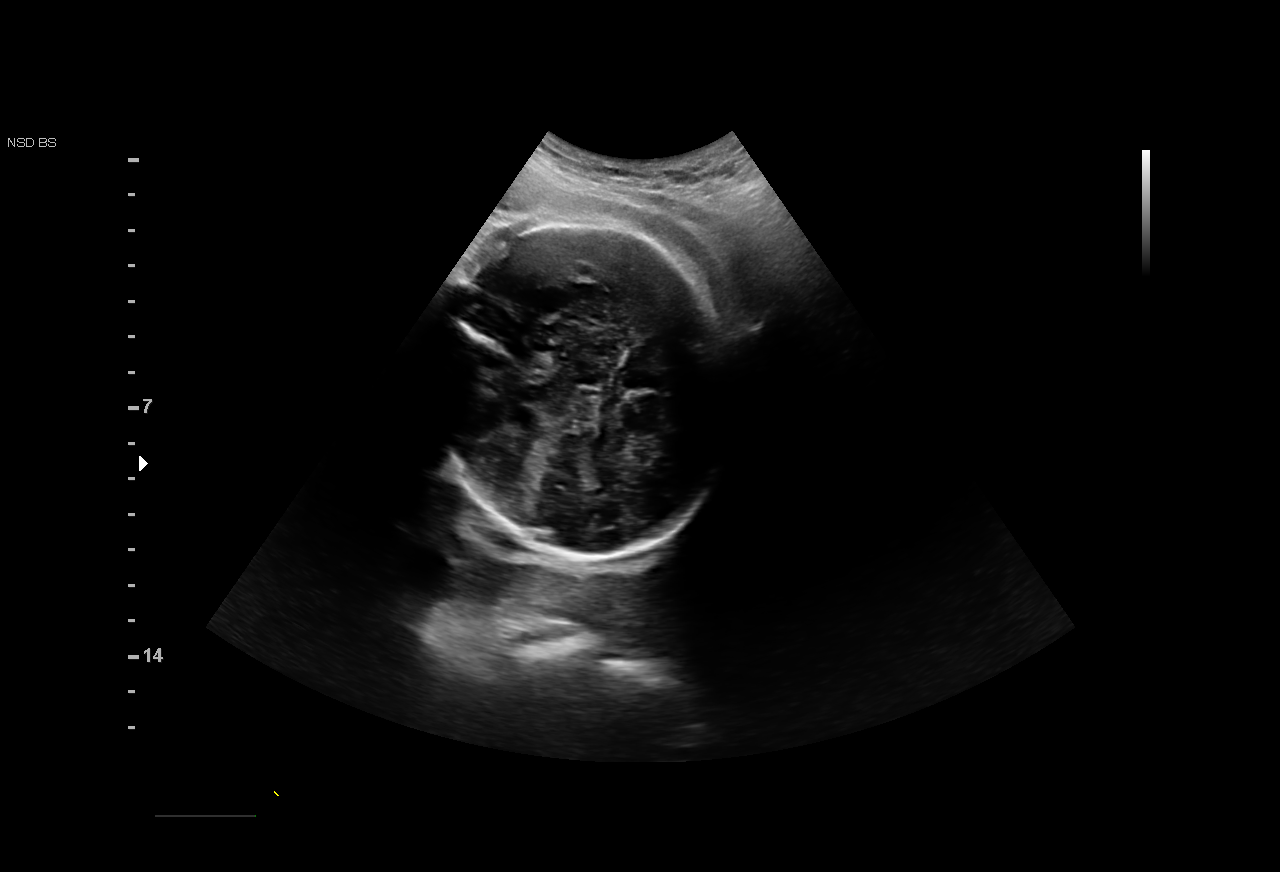
[im 2/8]
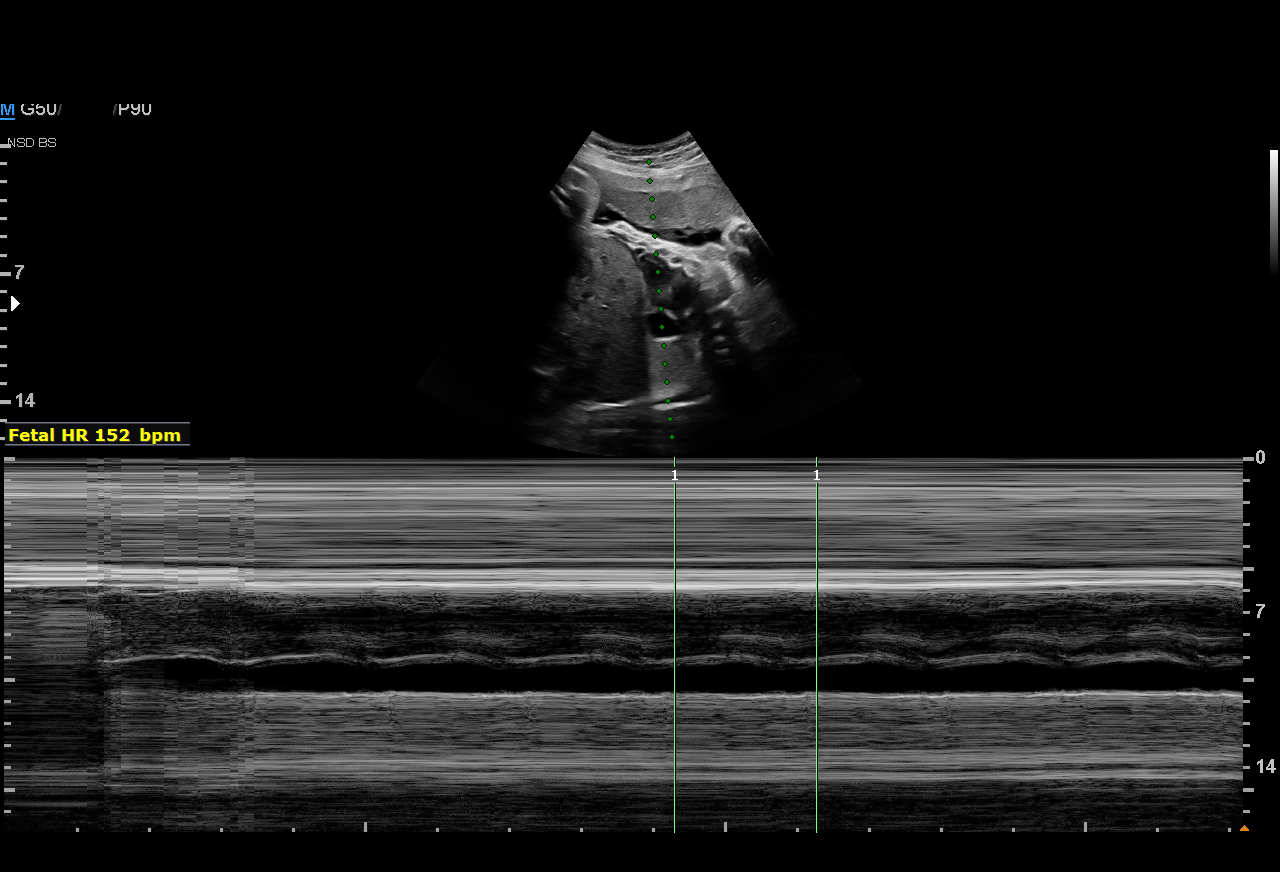
[im 3/8]
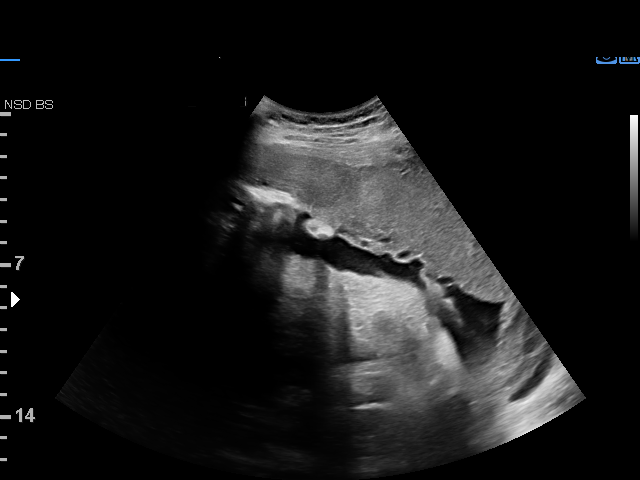
[im 4/8]
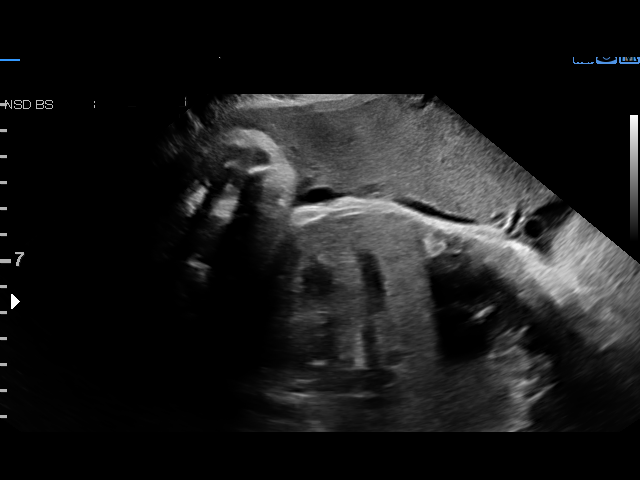
[im 5/8]
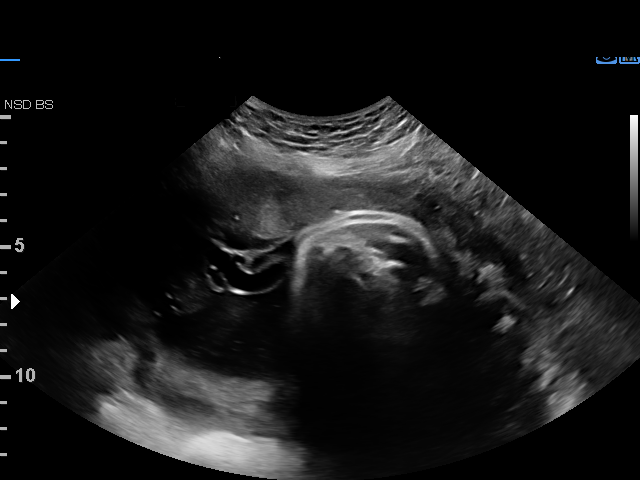
[im 6/8]
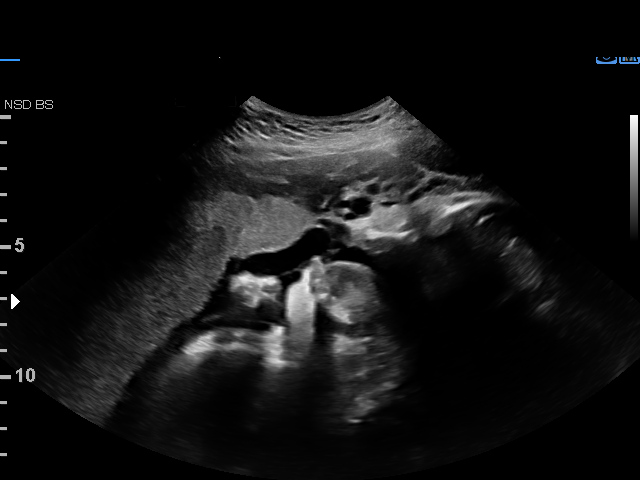
[im 7/8]
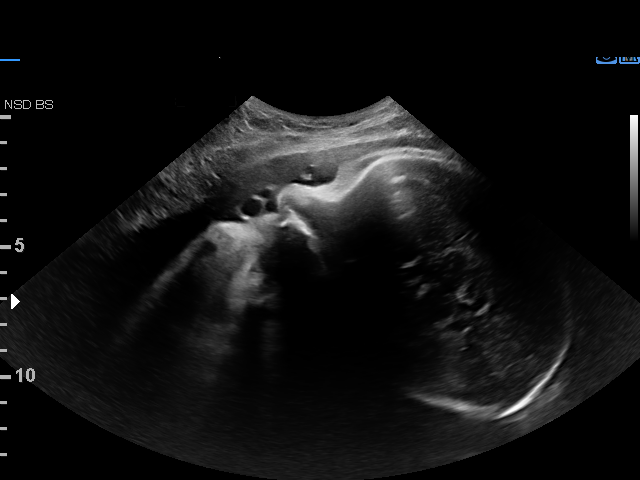
[im 8/8]
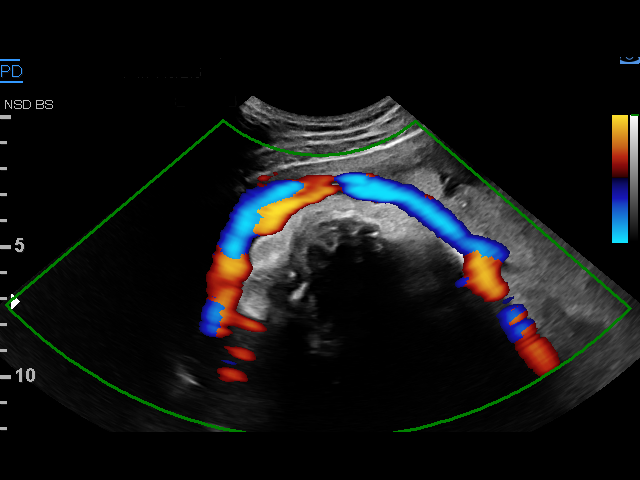

[8 of 8 positions shown; findings below may reference images not displayed]

SALACZ

                                                             JOANNA ANDRZEJ/Triage
                   SAGEL

     STRESS                                            SAGEL
 ----------------------------------------------------------------------

 ----------------------------------------------------------------------
Indications

  Variable fetal heart rate decelerations,
  antepartum
  37 weeks gestation of pregnancy
 ----------------------------------------------------------------------
Fetal Evaluation

 Num Of Fetuses:          1
 Fetal Heart Rate(bpm):   152
 Cardiac Activity:        Observed
 Presentation:            Cephalic

 Amniotic Fluid
 AFI FV:      Within normal limits

 AFI Sum(cm)     %Tile       Largest Pocket(cm)
 16.73           64

 RUQ(cm)       RLQ(cm)       LUQ(cm)        LLQ(cm)


 Comment:    Cord draped around face and head.
Biophysical Evaluation

 Amniotic F.V:   Within normal limits       F. Tone:         Observed
 F. Movement:    Observed                   Score:           [DATE]
 F. Breathing:   Observed
OB History

 Gravidity:    1         Term:   0        Prem:   0        SAB:   0
 TOP:          0       Ectopic:  0        Living: 0
Gestational Age

 LMP:           37w 2d        Date:  11/22/18                 EDD:   08/29/19
 Best:          37w 2d     Det. By:  LMP  (11/22/18)          EDD:   08/29/19
Impression

 Known seizure disorder on no meds
 BIophysical profile [DATE]
 Variable heart rate deceleration noted in JOANNA ANDRZEJ
 [place] amniotic fluid and fetal movement noted.
Recommendations

 Follow up as clinically indicated.

## 2020-07-27 ENCOUNTER — Ambulatory Visit: Payer: Medicaid Other

## 2020-07-27 DIAGNOSIS — O0990 Supervision of high risk pregnancy, unspecified, unspecified trimester: Secondary | ICD-10-CM | POA: Insufficient documentation

## 2020-07-27 NOTE — Progress Notes (Signed)
I have attempted without success to contact this patient by phone x 3 for NOB Intake I left a message on answering machine and sent MyChart message to call Office.

## 2020-08-03 ENCOUNTER — Encounter: Payer: Medicaid Other | Admitting: Advanced Practice Midwife

## 2020-08-04 ENCOUNTER — Encounter (INDEPENDENT_AMBULATORY_CARE_PROVIDER_SITE_OTHER): Payer: Self-pay | Admitting: Pediatrics

## 2020-08-04 ENCOUNTER — Other Ambulatory Visit: Payer: Self-pay

## 2020-08-04 ENCOUNTER — Ambulatory Visit (INDEPENDENT_AMBULATORY_CARE_PROVIDER_SITE_OTHER): Payer: Medicaid Other | Admitting: Pediatrics

## 2020-08-04 DIAGNOSIS — R259 Unspecified abnormal involuntary movements: Secondary | ICD-10-CM

## 2020-08-04 DIAGNOSIS — G43809 Other migraine, not intractable, without status migrainosus: Secondary | ICD-10-CM

## 2020-08-04 DIAGNOSIS — Z3491 Encounter for supervision of normal pregnancy, unspecified, first trimester: Secondary | ICD-10-CM | POA: Insufficient documentation

## 2020-08-04 NOTE — Patient Instructions (Addendum)
Thank you for coming today.  I cannot be sure that the shaking that was seen by your boyfriend was a seizure.  If this happens again I want someone to make a video of the behavior.  We will likely perform an EEG.  I am not keen to put you on antiepileptic medication during the pregnancy.  We will do what we have to.  The pain that you are describing in your temples and behind her eyes sounds like a migraine variant.  There is no specific treatment for it.  The only medication that you can use now for pain during the pregnancy is Tylenol.  I would like to see you in 3 months time.  Again I think it would be good for you to the seen by primary care physicians at [facility] [phone].  Take care of yourself during this pregnancy congratulations on your second baby if you do not want a third you need to go see a gynecologist following the pregnancy to prevent further pregnancies.  The gynecologist can talk about a variety of options that you have.
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
## 2020-08-09 ENCOUNTER — Inpatient Hospital Stay (HOSPITAL_COMMUNITY)
Admission: AD | Admit: 2020-08-09 | Discharge: 2020-08-09 | Disposition: A | Discharge status: Home / Self Care | Payer: Medicaid Other | Attending: Obstetrics & Gynecology | Admitting: Obstetrics & Gynecology

## 2020-08-09 ENCOUNTER — Encounter (HOSPITAL_COMMUNITY): Payer: Self-pay | Admitting: Obstetrics & Gynecology

## 2020-08-09 ENCOUNTER — Other Ambulatory Visit: Payer: Self-pay

## 2020-08-09 DIAGNOSIS — B9689 Other specified bacterial agents as the cause of diseases classified elsewhere: Secondary | ICD-10-CM | POA: Insufficient documentation

## 2020-08-09 DIAGNOSIS — N760 Acute vaginitis: Secondary | ICD-10-CM

## 2020-08-09 DIAGNOSIS — Z3A13 13 weeks gestation of pregnancy: Secondary | ICD-10-CM | POA: Insufficient documentation

## 2020-08-09 DIAGNOSIS — O26891 Other specified pregnancy related conditions, first trimester: Secondary | ICD-10-CM | POA: Diagnosis not present

## 2020-08-09 DIAGNOSIS — O23593 Infection of other part of genital tract in pregnancy, third trimester: Secondary | ICD-10-CM | POA: Diagnosis not present

## 2020-08-09 DIAGNOSIS — O99891 Other specified diseases and conditions complicating pregnancy: Secondary | ICD-10-CM

## 2020-08-09 DIAGNOSIS — R109 Unspecified abdominal pain: Secondary | ICD-10-CM | POA: Diagnosis present

## 2020-08-09 DIAGNOSIS — Z8619 Personal history of other infectious and parasitic diseases: Secondary | ICD-10-CM | POA: Diagnosis not present

## 2020-08-09 HISTORY — DX: Female pelvic inflammatory disease, unspecified: N73.9

## 2020-08-09 LAB — URINALYSIS, ROUTINE W REFLEX MICROSCOPIC
Bilirubin Urine: NEGATIVE
Glucose, UA: NEGATIVE mg/dL
Hgb urine dipstick: NEGATIVE
Ketones, ur: NEGATIVE mg/dL
Nitrite: NEGATIVE
Protein, ur: NEGATIVE mg/dL
Specific Gravity, Urine: 1.009 (ref 1.005–1.030)
pH: 6 (ref 5.0–8.0)

## 2020-08-09 LAB — WET PREP, GENITAL
Sperm: NONE SEEN
Trich, Wet Prep: NONE SEEN
Yeast Wet Prep HPF POC: NONE SEEN

## 2020-08-09 MED ORDER — METRONIDAZOLE 500 MG PO TABS: 500.0000 mg | ORAL_TABLET | Freq: Two times a day (BID) | ORAL | 0 refills | Status: DC

## 2020-08-09 MED ORDER — FLUCONAZOLE 150 MG PO TABS: 150.0000 mg | ORAL_TABLET | Freq: Once | ORAL | 1 refills | Status: AC

## 2020-08-09 NOTE — MAU Provider Note (Signed)
Event Date/Time   First Provider Initiated Contact with Patient 08/09/20 1931     S [name] is a 19 y.o. G19P1001 pregnant female at [redacted]w[redacted]d who presents to MAU today with complaint of diffuse lower abdominal cramping and white vaginal discharge with occasional pink spotting. Pt was diagnosed with trichomonas in November and finished her treatment. Her partner was also treated but they had IC within 5 days of him starting his treatment. She thinks she had a yeast infection after she finished her round of antibiotics but is not sure.   O BP 116/69 (BP Location: Right Arm)   Pulse 91   Temp 98.5 F (36.9 C) (Oral)   Resp 18   Ht 5\' 7"  (1.702 m)   Wt 174 lb 6.4 oz (79.1 kg)   LMP 05/19/2020   SpO2 100%   BMI 27.31 kg/m  Physical Exam Vitals and nursing note reviewed.  Constitutional:      Appearance: Normal appearance. She is normal weight.  Eyes:     Pupils: Pupils are equal, round, and reactive to light.  Cardiovascular:     Rate and Rhythm: Normal rate.     Pulses: Normal pulses.  Pulmonary:     Effort: Pulmonary effort is normal.  Genitourinary:    Vagina: Vaginal discharge (white discharge noted, no blood) present.  Musculoskeletal:        General: Normal range of motion.  Skin:    General: Skin is warm and dry.     Capillary Refill: Capillary refill takes less than 2 seconds.  Neurological:     Mental Status: She is alert and oriented to person, place, and time.  Psychiatric:        Mood and Affect: Mood normal.        Behavior: Behavior normal.        Thought Content: Thought content normal.        Judgment: Judgment normal.    FHR: 156  A Bacterial vaginosis - flagyl and diflucan sent to pharmacy  P Discharge from MAU in stable condition  First OB appt at [facility] on 08/26/19. Warning signs for worsening condition that would warrant emergency follow-up discussed  10/24/19 08/09/2020 8:36 PM

## 2020-08-09 NOTE — Discharge Instructions (Signed)

## 2020-08-09 NOTE — MAU Note (Signed)
Is 3 months preg.  For the last 3 wks has had low back pain and sharp pain in pelvic area.  Pink spotting for the past 2 wks.  Cramping and brown d/c.

## 2020-08-10 LAB — GC/CHLAMYDIA PROBE AMP (~~LOC~~) NOT AT ARMC
Chlamydia: NEGATIVE
Comment: NEGATIVE
Comment: NORMAL
Neisseria Gonorrhea: NEGATIVE

## 2020-08-17 ENCOUNTER — Inpatient Hospital Stay (HOSPITAL_BASED_OUTPATIENT_CLINIC_OR_DEPARTMENT_OTHER): Payer: Medicaid Other

## 2020-08-17 ENCOUNTER — Encounter (HOSPITAL_COMMUNITY): Payer: Self-pay | Admitting: Family Medicine

## 2020-08-17 ENCOUNTER — Inpatient Hospital Stay (HOSPITAL_COMMUNITY)
Admission: AD | Admit: 2020-08-17 | Discharge: 2020-08-17 | Disposition: A | Discharge status: Home / Self Care | Payer: Medicaid Other | Attending: Obstetrics & Gynecology | Admitting: Obstetrics & Gynecology

## 2020-08-17 DIAGNOSIS — O039 Complete or unspecified spontaneous abortion without complication: Secondary | ICD-10-CM

## 2020-08-17 DIAGNOSIS — Z87820 Personal history of traumatic brain injury: Secondary | ICD-10-CM | POA: Diagnosis not present

## 2020-08-17 DIAGNOSIS — O26892 Other specified pregnancy related conditions, second trimester: Secondary | ICD-10-CM

## 2020-08-17 DIAGNOSIS — Z3A14 14 weeks gestation of pregnancy: Secondary | ICD-10-CM | POA: Insufficient documentation

## 2020-08-17 DIAGNOSIS — R109 Unspecified abdominal pain: Secondary | ICD-10-CM | POA: Insufficient documentation

## 2020-08-17 DIAGNOSIS — O4692 Antepartum hemorrhage, unspecified, second trimester: Secondary | ICD-10-CM

## 2020-08-17 DIAGNOSIS — Z888 Allergy status to other drugs, medicaments and biological substances status: Secondary | ICD-10-CM | POA: Insufficient documentation

## 2020-08-17 DIAGNOSIS — Z881 Allergy status to other antibiotic agents status: Secondary | ICD-10-CM | POA: Diagnosis not present

## 2020-08-17 DIAGNOSIS — Z87891 Personal history of nicotine dependence: Secondary | ICD-10-CM | POA: Insufficient documentation

## 2020-08-17 LAB — WET PREP, GENITAL
Clue Cells Wet Prep HPF POC: NONE SEEN
Sperm: NONE SEEN
Trich, Wet Prep: NONE SEEN
Yeast Wet Prep HPF POC: NONE SEEN

## 2020-08-17 LAB — CBC
HCT: 34.7 % — ABNORMAL LOW (ref 36.0–46.0)
Hemoglobin: 12.4 g/dL (ref 12.0–15.0)
MCH: 28.3 pg (ref 26.0–34.0)
MCHC: 35.7 g/dL (ref 30.0–36.0)
MCV: 79.2 fL — ABNORMAL LOW (ref 80.0–100.0)
Platelets: 189 10*3/uL (ref 150–400)
RBC: 4.38 MIL/uL (ref 3.87–5.11)
RDW: 15.9 % — ABNORMAL HIGH (ref 11.5–15.5)
WBC: 12.6 10*3/uL — ABNORMAL HIGH (ref 4.0–10.5)
nRBC: 0 % (ref 0.0–0.2)

## 2020-08-17 MED ORDER — HYDROMORPHONE HCL 1 MG/ML IJ SOLN
1.0000 mg | INTRAMUSCULAR | Status: DC | PRN
  Administered 2020-08-17 (×2): 1 mg via INTRAVENOUS
  Filled 2020-08-17 (×3): qty 1

## 2020-08-17 MED ORDER — IBUPROFEN 600 MG PO TABS: 600.0000 mg | ORAL_TABLET | Freq: Four times a day (QID) | ORAL | 0 refills | Status: DC | PRN

## 2020-08-17 MED ORDER — PROMETHAZINE HCL 25 MG/ML IJ SOLN
25.0000 mg | Freq: Once | INTRAMUSCULAR | Status: AC
  Administered 2020-08-17: 16:00:00 25 mg via INTRAVENOUS
  Filled 2020-08-17: qty 1

## 2020-08-17 MED ORDER — OXYTOCIN-SODIUM CHLORIDE 30-0.9 UT/500ML-% IV SOLN
INTRAVENOUS | Status: AC
  Filled 2020-08-17: qty 500

## 2020-08-17 MED ORDER — PROMETHAZINE HCL 12.5 MG PO TABS: 12.5000 mg | ORAL_TABLET | Freq: Four times a day (QID) | ORAL | 0 refills | Status: DC | PRN

## 2020-08-17 MED ORDER — OXYTOCIN 10 UNIT/ML IJ SOLN
INTRAMUSCULAR | Status: AC
  Administered 2020-08-17: 17:00:00 40 [IU]
  Filled 2020-08-17: qty 1

## 2020-08-17 MED ORDER — OXYCODONE HCL 5 MG PO TABS: 5.0000 mg | ORAL_TABLET | ORAL | 0 refills | Status: AC | PRN

## 2020-08-17 MED ORDER — KETOROLAC TROMETHAMINE 30 MG/ML IJ SOLN
30.0000 mg | Freq: Once | INTRAMUSCULAR | Status: AC
  Administered 2020-08-17: 17:00:00 30 mg via INTRAVENOUS
  Filled 2020-08-17: qty 1

## 2020-08-17 MED ORDER — MISOPROSTOL 200 MCG PO TABS
ORAL_TABLET | ORAL | Status: AC
  Administered 2020-08-17: 17:00:00 1000 ug
  Filled 2020-08-17: qty 5

## 2020-08-17 MED ORDER — ACETAMINOPHEN 500 MG PO TABS
1000.0000 mg | ORAL_TABLET | Freq: Once | ORAL | Status: AC
  Administered 2020-08-17: 19:00:00 1000 mg via ORAL
  Filled 2020-08-17: qty 2

## 2020-08-17 MED ORDER — HYDROMORPHONE HCL 1 MG/ML IJ SOLN
2.0000 mg | INTRAMUSCULAR | Status: DC | PRN
  Administered 2020-08-17: 1 mg via INTRAVENOUS

## 2020-08-17 NOTE — Discharge Instructions (Signed)
Return to MAU:  If you have heavier bleeding that soaks through more that 2 pads per hour for an hour or more  If you bleed so much that you feel like you might pass out or you do pass out  If you have significant abdominal pain that is not improved with Tylenol 1000 mg every 6 hours as needed for pain  If you develop a fever > 100.5   

## 2020-08-17 NOTE — Progress Notes (Signed)
Pt delivered at 1626, bleeding now. Pt to be DC and follow up with MD. DC instructions explained to pt and support. Pt will up up remains of fetus. Wheeled pt to car accompanied by support person

## 2020-08-17 NOTE — MAU Provider Note (Signed)
History     CSN: [id number]  Arrival date and time: 08/17/20 1248   Event Date/Time   First Provider Initiated Contact with Patient 08/17/20 1311      Chief Complaint  Patient presents with  . Abdominal Pain  . Vaginal Bleeding   Melissa [name] is a 19 y.o. year old G59P1001 female at [redacted]w[redacted]d weeks gestation who presents to MAU via EMS reporting abdominal pain since last night, vaginal spotting that started at 0600 and has worsened since 1000. She reports feeling a "gush of blood" from her vagina when EMS arrived to pick her up. She is unsure, if any tissue passed. She is scheduled to have a NOB appointment at [facility] on 08/25/2020.   OB History    Gravida  2   Para  1   Term  1   Preterm      AB      Living  1     SAB      IAB      Ectopic      Multiple  0   Live Births  1           Past Medical History:  Diagnosis Date  . Anemia   . Anxiety   . Asthma    inhaler. last attack June 2019  . Congenital hydronephrosis 2001  . Constipation   . Depression   . Headache    migraines  . PID (pelvic inflammatory disease)   . Seizures ([name])    last one in 2015 - on meds  . TBI (traumatic brain injury) ([name]) 2006    Past Surgical History:  Procedure Laterality Date  . APPENDECTOMY    . HERNIA REPAIR    . INTESTINAL MALROTATION REPAIR  2001    Family History  Problem Relation Age of Onset  . Depression Mother   . Stroke Mother   . Obesity Mother   . Post-traumatic stress disorder Mother   . Anxiety disorder Mother   . Hypertension Mother   . Diabetes Mother   . Schizophrenia Father   . Kidney disease Father     Social History   Tobacco Use  . Smoking status: Former Smoker    Types: Cigarettes, Cigars  . Smokeless tobacco: Never Used  . Tobacco comment: black and milds, none since + UPT  Vaping Use  . Vaping Use: Never used  Substance Use Topics  . Alcohol use: Never  . Drug use: Not Currently    Types: Marijuana    Comment:  last 2020    Allergies:  Allergies  Allergen Reactions  . Benadryl [Diphenhydramine Hcl] Other (See Comments)    Causes seizures   . Gluten Meal Nausea And Vomiting  . Zithromax [Azithromycin] Anaphylaxis and Swelling  . Lactose Intolerance (Gi) Diarrhea    Medications Prior to Admission  Medication Sig Dispense Refill Last Dose  . acetaminophen (TYLENOL) 500 MG tablet Take 500 mg by mouth every 6 (six) hours as needed.     . cetirizine (ZYRTEC) 10 MG tablet TAKE 1 TABLET BY MOUTH EVERY DAY AS NEEDED FOR ALLERGY 30 tablet 11   . ferrous sulfate 325 (65 FE) MG tablet Take 325 mg by mouth daily with breakfast.     . metroNIDAZOLE (FLAGYL) 500 MG tablet Take 1 tablet (500 mg total) by mouth 2 (two) times daily. 14 tablet 0   . Prenatal Vit-Fe Fumarate-FA (MULTIVITAMIN-PRENATAL) 27-0.8 MG TABS tablet Take 1 tablet by mouth daily at 12  noon.       Review of Systems  Constitutional: Negative.   HENT: Negative.   Eyes: Negative.   Respiratory: Negative.   Cardiovascular: Negative.   Gastrointestinal: Negative.   Endocrine: Negative.   Genitourinary: Positive for pelvic pain and vaginal bleeding.  Musculoskeletal: Positive for back pain.  Skin: Negative.   Allergic/Immunologic: Negative.   Neurological: Negative.   Hematological: Negative.   Psychiatric/Behavioral: Negative.    Physical Exam   Blood pressure 109/68, pulse (!) 103, temperature 98.2 F (36.8 C), resp. rate 18, last menstrual period 05/19/2020, SpO2 100 %, not currently breastfeeding.  Physical Exam Vitals and nursing note reviewed. Exam conducted with a chaperone present.  Constitutional:      General: She is in acute distress.     Appearance: Normal appearance. She is normal weight.     Comments: Patient writhing in pain and screaming intermittently; pain rated 10/10  HENT:     Head: Normocephalic and atraumatic.  Cardiovascular:     Rate and Rhythm: Normal rate.     Pulses: Normal pulses.  Pulmonary:      Effort: Pulmonary effort is normal.  Abdominal:     General: Abdomen is flat.  Genitourinary:    General: Normal vulva.     Comments: Uterus: non-tender, SE: cervix is smooth, pink, no lesions, moderate amt of dark, red blood and clots in vaginal vault -- WP, GC/CT done, cervix appears to be open with ? Ruptured membranes coming from cervical os, mild CMT, no friability, no adnexal tenderness  Musculoskeletal:        General: Normal range of motion.     Cervical back: Normal range of motion.  Skin:    General: Skin is warm and dry.  Neurological:     Mental Status: She is alert and oriented to person, place, and time.  Psychiatric:        Mood and Affect: Mood normal.        Behavior: Behavior normal.        Thought Content: Thought content normal.        Judgment: Judgment normal.    MAU Course  Procedures Vaginal delivery at 1626 of non-viable fetus that is c/w dates - Dr. Shawnie PonsPratt at bedside for delivery  Pitocin 40 mg injected in cord by Dr. Shawnie PonsPratt other 40 mg given by IV bolus  Reassessment @ 1750: delivery of intact placenta at 1805.  Patient voices desire to take fetus home for family burial; does not desire [facility] genetic testing.  MDM CBC -- declined Wet Prep GC/CT -- Results pending  Dilaudid 1 mg every 2 hrs prn pain>>dose increased to 2 mg every 3 hours Phenergan 25 mg IVP x 1 Cytotec 1000 mcg rectally Toradol 30 mg IV  Pitocin 40 mg in umbilical cord and 40 mg IV Fetus and placenta to [facility] for genetic testing  Results for orders placed or performed during the hospital encounter of 08/17/20 (from the past 24 hour(s))  Wet prep, genital     Status: Abnormal   Collection Time: 08/17/20  1:34 PM  Result Value Ref Range   Yeast Wet Prep HPF POC NONE SEEN NONE SEEN   Trich, Wet Prep NONE SEEN NONE SEEN   Clue Cells Wet Prep HPF POC NONE SEEN NONE SEEN   WBC, Wet Prep HPF POC MANY (A) NONE SEEN   Sperm NONE SEEN   CBC     Status: Abnormal   Collection Time:  08/17/20  2:10 PM  Result  Value Ref Range   WBC 12.6 (H) 4.0 - 10.5 K/uL   RBC 4.38 3.87 - 5.11 MIL/uL   Hemoglobin 12.4 12.0 - 15.0 g/dL   HCT 33.5 (L) 45.6 - 25.6 %   MCV 79.2 (L) 80.0 - 100.0 fL   MCH 28.3 26.0 - 34.0 pg   MCHC 35.7 30.0 - 36.0 g/dL   RDW 38.9 (H) 37.3 - 42.8 %   Platelets 189 150 - 400 K/uL   nRBC 0.0 0.0 - 0.2 %    [place] MFM OB LIMITED  Result Date: 08/17/2020 ----------------------------------------------------------------------  OBSTETRICS REPORT                       (Signed Final 08/17/2020 05:16 pm) ---------------------------------------------------------------------- Patient Info  ID #:       [id number]                          D.O.B.:  10-30-2000 (19 yrs)  Name:       Melissa [name] [facility]                 Visit Date: 08/17/2020 02:31 pm              WALLACE ---------------------------------------------------------------------- Performed By  Attending:        Noralee Space MD        Referred By:      [facility] MAU/Triage  Performed By:     Percell Boston          [facility] ---------------------------------------------------------------------- Orders  #  Description                           Code        Ordered By  1  [place] MFM OB LIMITED                     20355.97    CBULAGT Juhi Lagrange ----------------------------------------------------------------------  #  Order #                     Accession #                Episode #  1  [id number]                   [id number]                 [id number] ---------------------------------------------------------------------- Indications  [redacted] weeks gestation of pregnancy                Z3A.14  Vaginal bleeding in pregnancy, second          O46.92  trimester  Abdominal pain in pregnancy                    O99.89 ---------------------------------------------------------------------- Fetal Evaluation  Num Of Fetuses:         1  Fetal Heart Rate(bpm):  160  Cardiac  Activity:       Observed  Presentation:  Breech  Placenta:               Posterior  P. Cord Insertion:      Visualized, central  Amniotic Fluid  AFI FV:      Within normal limits                              Largest Pocket(cm)                              4.3 ---------------------------------------------------------------------- OB History  Gravidity:    2         Term:   1        Prem:   0        SAB:   0  TOP:          0       Ectopic:  0        Living: 1 ---------------------------------------------------------------------- Gestational Age  LMP:           12w 6d        Date:  05/19/20                 EDD:   02/23/21  Best:          14w 2d     Det. ByMarcella Dubs         EDD:   02/13/21                                      (07/03/20) ---------------------------------------------------------------------- Cervix Uterus Adnexa  Cervix  Closed  Uterus  No abnormality visualized.  Right Ovary  No adnexal mass visualized.  Left Ovary  No adnexal mass visualized.  Cul De Sac  No free fluid seen.  Adnexa  No abnormality visualized. ---------------------------------------------------------------------- Impression  Patient is being evaluated at the MAU for c/o abdominal pain.  Ultrasound evaluation was limited because of patient  discomfort and pain. Amniotic fluid is normal and good fetal  heart activity is seen. We could not comment on the cervix  because of inadequate evaluation. ---------------------------------------------------------------------- Recommendations  -Sterile speculum exam to rule out inevitable miscarriage.  -If cervix is closed on speculum exam, transvaginal  ultrasound to evaluate the cervix may be performed when the  patient does not have pain. ----------------------------------------------------------------------                  Noralee Space, MD Electronically Signed Final Report   08/17/2020 05:16 pm ----------------------------------------------------------------------    Assessment and Plan  Spontaneous miscarriage - at 14.[redacted] wks gestation - Plan: Discharge patient - Information provided on miscarriage and abd pain in pregnancy - Return to MAU:  If you have heavier bleeding that soaks through more that 2 pads per hour for an hour or more  If you bleed so much that you feel like you might pass out or you do pass out  If you have significant abdominal pain that is not improved with Tylenol 1000 mg every 6 hours as needed for pain  If you develop a fever > 100.5 - Rx for Oxycodone 5 mg 1 tablet every 4 hours prn pain - Rx for Phenergan 12.5 mg every 6 hours prn N/V - Rx for Ibuprofen 600 mg every 6 hours - Follow-up with [facility] on 08/25/2020 as  previously scheduled - Message to [facility] to change appt type to SAB F/U - Patient verbalized an understanding of the plan of care and agrees.      Raelyn Mora, CNM 08/17/2020, 1:12 PM

## 2020-08-17 NOTE — MAU Note (Signed)
Patient reports abdominal pain that started last night and spotting beginning around 0600 that got worse around 1000.  Patient had a gush of blood when EMS came to get her but uncertain whether she saw any tissue.

## 2020-08-18 LAB — GC/CHLAMYDIA PROBE AMP (~~LOC~~) NOT AT ARMC
Chlamydia: NEGATIVE
Comment: NEGATIVE
Comment: NORMAL
Neisseria Gonorrhea: NEGATIVE

## 2020-08-19 ENCOUNTER — Encounter (HOSPITAL_COMMUNITY): Payer: Self-pay | Admitting: Obstetrics & Gynecology

## 2020-08-19 ENCOUNTER — Inpatient Hospital Stay (HOSPITAL_COMMUNITY): Payer: Medicaid Other

## 2020-08-19 ENCOUNTER — Other Ambulatory Visit: Payer: Self-pay

## 2020-08-19 ENCOUNTER — Inpatient Hospital Stay (HOSPITAL_COMMUNITY)
Admission: AD | Admit: 2020-08-19 | Discharge: 2020-08-19 | Disposition: A | Discharge status: Home / Self Care | Payer: Medicaid Other | Attending: Obstetrics & Gynecology | Admitting: Obstetrics & Gynecology

## 2020-08-19 DIAGNOSIS — R102 Pelvic and perineal pain: Secondary | ICD-10-CM | POA: Insufficient documentation

## 2020-08-19 DIAGNOSIS — G43909 Migraine, unspecified, not intractable, without status migrainosus: Secondary | ICD-10-CM

## 2020-08-19 DIAGNOSIS — R1030 Lower abdominal pain, unspecified: Secondary | ICD-10-CM

## 2020-08-19 DIAGNOSIS — G43019 Migraine without aura, intractable, without status migrainosus: Secondary | ICD-10-CM | POA: Diagnosis not present

## 2020-08-19 DIAGNOSIS — D62 Acute posthemorrhagic anemia: Secondary | ICD-10-CM

## 2020-08-19 DIAGNOSIS — R109 Unspecified abdominal pain: Secondary | ICD-10-CM | POA: Diagnosis present

## 2020-08-19 DIAGNOSIS — O036 Delayed or excessive hemorrhage following complete or unspecified spontaneous abortion: Secondary | ICD-10-CM | POA: Insufficient documentation

## 2020-08-19 DIAGNOSIS — Z87891 Personal history of nicotine dependence: Secondary | ICD-10-CM | POA: Diagnosis not present

## 2020-08-19 LAB — CBC WITH DIFFERENTIAL/PLATELET
Abs Immature Granulocytes: 0.08 10*3/uL — ABNORMAL HIGH (ref 0.00–0.07)
Basophils Absolute: 0 10*3/uL (ref 0.0–0.1)
Basophils Relative: 0 %
Eosinophils Absolute: 0.1 10*3/uL (ref 0.0–0.5)
Eosinophils Relative: 1 %
HCT: 28.1 % — ABNORMAL LOW (ref 36.0–46.0)
Hemoglobin: 9.5 g/dL — ABNORMAL LOW (ref 12.0–15.0)
Immature Granulocytes: 1 %
Lymphocytes Relative: 16 %
Lymphs Abs: 1.9 10*3/uL (ref 0.7–4.0)
MCH: 27.5 pg (ref 26.0–34.0)
MCHC: 33.8 g/dL (ref 30.0–36.0)
MCV: 81.2 fL (ref 80.0–100.0)
Monocytes Absolute: 0.7 10*3/uL (ref 0.1–1.0)
Monocytes Relative: 6 %
Neutro Abs: 9.3 10*3/uL — ABNORMAL HIGH (ref 1.7–7.7)
Neutrophils Relative %: 76 %
Platelets: 162 10*3/uL (ref 150–400)
RBC: 3.46 MIL/uL — ABNORMAL LOW (ref 3.87–5.11)
RDW: 16.2 % — ABNORMAL HIGH (ref 11.5–15.5)
WBC: 12 10*3/uL — ABNORMAL HIGH (ref 4.0–10.5)
nRBC: 0 % (ref 0.0–0.2)

## 2020-08-19 LAB — URINALYSIS, ROUTINE W REFLEX MICROSCOPIC
Bilirubin Urine: NEGATIVE
Glucose, UA: 50 mg/dL — AB
Ketones, ur: NEGATIVE mg/dL
Nitrite: NEGATIVE
Protein, ur: 30 mg/dL — AB
Specific Gravity, Urine: 1.003 — ABNORMAL LOW (ref 1.005–1.030)
pH: 6 (ref 5.0–8.0)

## 2020-08-19 LAB — COMPREHENSIVE METABOLIC PANEL
ALT: 9 U/L (ref 0–44)
AST: 13 U/L — ABNORMAL LOW (ref 15–41)
Albumin: 2.6 g/dL — ABNORMAL LOW (ref 3.5–5.0)
Alkaline Phosphatase: 51 U/L (ref 38–126)
Anion gap: 9 (ref 5–15)
BUN: 7 mg/dL (ref 6–20)
CO2: 21 mmol/L — ABNORMAL LOW (ref 22–32)
Calcium: 8.5 mg/dL — ABNORMAL LOW (ref 8.9–10.3)
Chloride: 105 mmol/L (ref 98–111)
Creatinine, Ser: 0.76 mg/dL (ref 0.44–1.00)
GFR, Estimated: >60 mL/min (ref >60)
Glucose, Bld: 88 mg/dL (ref 70–99)
Potassium: 3.3 mmol/L — ABNORMAL LOW (ref 3.5–5.1)
Sodium: 135 mmol/L (ref 135–145)
Total Bilirubin: 0.2 mg/dL — ABNORMAL LOW (ref 0.3–1.2)
Total Protein: 5.8 g/dL — ABNORMAL LOW (ref 6.5–8.1)

## 2020-08-19 MED ORDER — IBUPROFEN 800 MG PO TABS
800.0000 mg | ORAL_TABLET | Freq: Once | ORAL | Status: AC
  Administered 2020-08-19: 05:00:00 800 mg via ORAL
  Filled 2020-08-19: qty 1

## 2020-08-19 MED ORDER — LACTATED RINGERS IV BOLUS
1000.0000 mL | Freq: Once | INTRAVENOUS | Status: AC
  Administered 2020-08-19: 01:00:00 1000 mL via INTRAVENOUS

## 2020-08-19 MED ORDER — DEXAMETHASONE SODIUM PHOSPHATE 10 MG/ML IJ SOLN
10.0000 mg | Freq: Once | INTRAMUSCULAR | Status: DC
  Filled 2020-08-19: qty 1

## 2020-08-19 MED ORDER — METOCLOPRAMIDE HCL 5 MG/ML IJ SOLN
10.0000 mg | Freq: Once | INTRAMUSCULAR | Status: DC
  Filled 2020-08-19: qty 2

## 2020-08-19 MED ORDER — HYDROMORPHONE HCL 1 MG/ML IJ SOLN
1.0000 mg | Freq: Once | INTRAMUSCULAR | Status: AC
  Administered 2020-08-19: 01:00:00 1 mg via INTRAVENOUS
  Filled 2020-08-19: qty 1

## 2020-08-19 MED ORDER — MISOPROSTOL 200 MCG PO TABS: 200.0000 ug | ORAL_TABLET | Freq: Three times a day (TID) | ORAL | 0 refills | Status: DC

## 2020-08-19 MED ORDER — SODIUM CHLORIDE 0.9 % IV SOLN
510.0000 mg | Freq: Once | INTRAVENOUS | Status: AC
  Administered 2020-08-19: 04:00:00 510 mg via INTRAVENOUS
  Filled 2020-08-19: qty 17

## 2020-08-19 MED ORDER — SODIUM CHLORIDE 0.9 % IV SOLN
500.0000 mg | Freq: Once | INTRAVENOUS | Status: DC
  Filled 2020-08-19: qty 25

## 2020-08-19 MED ORDER — CYCLOBENZAPRINE HCL 10 MG PO TABS: 10.0000 mg | ORAL_TABLET | Freq: Two times a day (BID) | ORAL | 0 refills | Status: DC | PRN

## 2020-08-19 NOTE — MAU Note (Signed)
Patient returns to MAU complaining of sharp abdominal pain and a headache. Both began after she left the hospital and have gotten worse today are now 9/10. Patient reports that her bleeding has significantly decreased and is only spotting now.

## 2020-08-19 NOTE — MAU Provider Note (Signed)
History     CSN: [id number]  Arrival date and time: 08/19/20 0007   Event Date/Time   First Provider Initiated Contact with Patient 08/19/20 0049      Chief Complaint  Patient presents with  . Abdominal Pain  . Headache   Melissa [name] is a 19 y.o. G2P1 who is 1 day s/p miscarriage at [redacted]w[redacted]d. Patient presents to MAU with complaints of abdominal pain and headache. Patient reports that both abdominal pain and headache started shortly after leaving the hospital on 12/28 but have continued to worsen over the course of the day and night. Describes abdominal pain as sharp cramping below the umbilicus. Rates both HA and abdominal pain 9/10 - patient reports she has taken 1 500mg  tylenol when she left hospital, 1 5mg  percocet around 2pm and 1 ibuprofen around 1900 tonight prior to coming to MAU. Patient reports that her bleeding has decreased and is spotting. She denies any other complaints or concerns at this time.   OB History    Gravida  2   Para  1   Term  1   Preterm      AB      Living  1     SAB      IAB      Ectopic      Multiple  0   Live Births  1           Past Medical History:  Diagnosis Date  . Anemia   . Anxiety   . Asthma    inhaler. last attack June 2019  . Congenital hydronephrosis 2001  . Constipation   . Depression   . Headache    migraines  . PID (pelvic inflammatory disease)   . Seizures ([name])    last one in 2015 - on meds  . TBI (traumatic brain injury) ([name]) 2006    Past Surgical History:  Procedure Laterality Date  . APPENDECTOMY    . HERNIA REPAIR    . INTESTINAL MALROTATION REPAIR  2001    Family History  Problem Relation Age of Onset  . Depression Mother   . Stroke Mother   . Obesity Mother   . Post-traumatic stress disorder Mother   . Anxiety disorder Mother   . Hypertension Mother   . Diabetes Mother   . Schizophrenia Father   . Kidney disease Father     Social History   Tobacco Use  . Smoking  status: Former Smoker    Types: Cigarettes, Cigars  . Smokeless tobacco: Never Used  . Tobacco comment: black and milds, none since + UPT  Vaping Use  . Vaping Use: Never used  Substance Use Topics  . Alcohol use: Never  . Drug use: Not Currently    Types: Marijuana    Comment: last 2020    Allergies:  Allergies  Allergen Reactions  . Benadryl [Diphenhydramine Hcl] Other (See Comments)    Causes seizures   . Gluten Meal Nausea And Vomiting  . Zithromax [Azithromycin] Anaphylaxis and Swelling  . Lactose Intolerance (Gi) Diarrhea    Medications Prior to Admission  Medication Sig Dispense Refill Last Dose  . acetaminophen (TYLENOL) 500 MG tablet Take 500 mg by mouth every 6 (six) hours as needed.   08/19/2020 at Unknown time  . oxyCODONE (ROXICODONE) 5 MG immediate release tablet Take 1 tablet (5 mg total) by mouth every 4 (four) hours as needed for up to 5 days for severe pain. 30 tablet 0 08/19/2020  at Unknown time  . cetirizine (ZYRTEC) 10 MG tablet TAKE 1 TABLET BY MOUTH EVERY DAY AS NEEDED FOR ALLERGY 30 tablet 11   . ferrous sulfate 325 (65 FE) MG tablet Take 325 mg by mouth daily with breakfast.     . ibuprofen (ADVIL) 600 MG tablet Take 1 tablet (600 mg total) by mouth every 6 (six) hours as needed. 60 tablet 0   . Prenatal Vit-Fe Fumarate-FA (MULTIVITAMIN-PRENATAL) 27-0.8 MG TABS tablet Take 1 tablet by mouth daily at 12 noon.     . promethazine (PHENERGAN) 12.5 MG tablet Take 1 tablet (12.5 mg total) by mouth every 6 (six) hours as needed for nausea or vomiting. 30 tablet 0     Review of Systems  Constitutional: Negative.   Respiratory: Negative.   Cardiovascular: Negative.   Gastrointestinal: Positive for abdominal pain. Negative for constipation, diarrhea, nausea and vomiting.  Genitourinary: Positive for vaginal bleeding. Negative for difficulty urinating, dysuria, frequency, hematuria, pelvic pain and urgency.  Musculoskeletal: Negative.   Neurological: Positive  for headaches. Negative for dizziness, weakness and light-headedness.  Psychiatric/Behavioral: Negative.    Physical Exam   Blood pressure 109/66, pulse (!) 101, temperature 98.2 F (36.8 C), resp. rate 20, last menstrual period 05/19/2020, SpO2 100 %, unknown if currently breastfeeding.  Physical Exam Vitals and nursing note reviewed.  Constitutional:      General: She is not in acute distress. HENT:     Head: Normocephalic.  Cardiovascular:     Rate and Rhythm: Regular rhythm. Tachycardia present.  Pulmonary:     Effort: Pulmonary effort is normal.     Breath sounds: Normal breath sounds.  Abdominal:     Palpations: Abdomen is soft.     Tenderness: There is abdominal tenderness. There is guarding. There is no right CVA tenderness or left CVA tenderness.     Comments: Tenderness with palpation at fundus. Fundus 2 above suprapubic. No increased vaginal bleeding with fundal rub.   Neurological:     Mental Status: She is alert and oriented to person, place, and time.  Psychiatric:        Mood and Affect: Mood normal.        Behavior: Behavior normal.        Thought Content: Thought content normal.    MAU Course  Procedures  MDM  Orders Placed This Encounter  Procedures  . Culture, OB Urine  . Urinalysis, Routine w reflex microscopic Urine, Clean Catch  . CBC with Differential/Platelet  . Comprehensive metabolic panel  . Insert peripheral IV   Meds ordered this encounter  Medications  . AND Linked Order Group   . metoCLOPramide (REGLAN) injection 10 mg   . dexamethasone (DECADRON) injection 10 mg  . lactated ringers bolus 1,000 mL  . HYDROmorphone (DILAUDID) injection 1 mg  . DISCONTD: iron sucrose (VENOFER) 500 mg in sodium chloride 0.9 % 250 mL IVPB  . ferumoxytol (FERAHEME) 510 mg in sodium chloride 0.9 % 100 mL IVPB  . ibuprofen (ADVIL) tablet 800 mg   Treatments in MAU included HA cocktail without benadryl, dilaudid IV.  Reassessment after treatment patient  reports HA and abdominal pain has resolved.   Labs reviewed:  Results for orders placed or performed during the hospital encounter of 08/19/20 (from the past 24 hour(s))  Urinalysis, Routine w reflex microscopic Urine, Clean Catch     Status: Abnormal   Collection Time: 08/19/20  1:27 AM  Result Value Ref Range   Color, Urine YELLOW YELLOW  APPearance HAZY (A) CLEAR   Specific Gravity, Urine 1.003 (L) 1.005 - 1.030   pH 6.0 5.0 - 8.0   Glucose, UA 50 (A) NEGATIVE mg/dL   Hgb urine dipstick LARGE (A) NEGATIVE   Bilirubin Urine NEGATIVE NEGATIVE   Ketones, ur NEGATIVE NEGATIVE mg/dL   Protein, ur 30 (A) NEGATIVE mg/dL   Nitrite NEGATIVE NEGATIVE   Leukocytes,Ua LARGE (A) NEGATIVE   RBC / HPF 0-5 0 - 5 RBC/hpf   WBC, UA 21-50 0 - 5 WBC/hpf   Bacteria, UA RARE (A) NONE SEEN   Squamous Epithelial / LPF 6-10 0 - 5   Mucus PRESENT    Hyaline Casts, UA PRESENT   CBC with Differential/Platelet     Status: Abnormal   Collection Time: 08/19/20  1:27 AM  Result Value Ref Range   WBC 12.0 (H) 4.0 - 10.5 K/uL   RBC 3.46 (L) 3.87 - 5.11 MIL/uL   Hemoglobin 9.5 (L) 12.0 - 15.0 g/dL   HCT 40.928.1 (L) 81.136.0 - 91.446.0 %   MCV 81.2 80.0 - 100.0 fL   MCH 27.5 26.0 - 34.0 pg   MCHC 33.8 30.0 - 36.0 g/dL   RDW 78.216.2 (H) 95.611.5 - 21.315.5 %   Platelets 162 150 - 400 K/uL   nRBC 0.0 0.0 - 0.2 %   Neutrophils Relative % 76 %   Neutro Abs 9.3 (H) 1.7 - 7.7 K/uL   Lymphocytes Relative 16 %   Lymphs Abs 1.9 0.7 - 4.0 K/uL   Monocytes Relative 6 %   Monocytes Absolute 0.7 0.1 - 1.0 K/uL   Eosinophils Relative 1 %   Eosinophils Absolute 0.1 0.0 - 0.5 K/uL   Basophils Relative 0 %   Basophils Absolute 0.0 0.0 - 0.1 K/uL   Immature Granulocytes 1 %   Abs Immature Granulocytes 0.08 (H) 0.00 - 0.07 K/uL  Comprehensive metabolic panel     Status: Abnormal   Collection Time: 08/19/20  1:27 AM  Result Value Ref Range   Sodium 135 135 - 145 mmol/L   Potassium 3.3 (L) 3.5 - 5.1 mmol/L   Chloride 105 98 - 111 mmol/L    CO2 21 (L) 22 - 32 mmol/L   Glucose, Bld 88 70 - 99 mg/dL   BUN 7 6 - 20 mg/dL   Creatinine, Ser 0.860.76 0.44 - 1.00 mg/dL   Calcium 8.5 (L) 8.9 - 10.3 mg/dL   Total Protein 5.8 (L) 6.5 - 8.1 g/dL   Albumin 2.6 (L) 3.5 - 5.0 g/dL   AST 13 (L) 15 - 41 U/L   ALT 9 0 - 44 U/L   Alkaline Phosphatase 51 38 - 126 U/L   Total Bilirubin 0.2 (L) 0.3 - 1.2 mg/dL   GFR, Estimated >57>60 >84>60 mL/min   Anion gap 9 5 - 15   Hgb 12.4 on 12/28 and today 9.5, educated and discussed results with patient - patient reports that she has always been anemic and "does not absorb oral iron well" per patient. Educated and discussed IV iron feraheme due to significant drop. Patient agrees to plan of care, discussed risks and benefits.   IV fereheme ordered and given. After completion patient reports that pain is returned - Ibuprofen PO ordered and follow up US ordered.  US report reviewed:  US PELVIS (TRANSABDOMINAL ONLY)  Result Date: 08/19/2020 CLINICAL DATA:  19 year old female with pain following 2nd trimester miscarriage 2 days ago. EXAM: TRANSABDOMINAL ULTRASOUND OF PELVIS TECHNIQUE: Transabdominal ultrasound examination of the pelvis was  performed including evaluation of the uterus, ovaries, adnexal regions, and pelvic cul-de-sac. COMPARISON:  Ob ultrasound 08/17/2020, 07/03/2020. FINDINGS: Uterus Measurements: 16.6 x 6.6 x 11.2 = volume: 641 mL. Within normal limits. Endometrium Thickness: 18 mm. Thickened and heterogeneous (image 20). Hypervascularity primarily appears to be in the myometrium bordering the endometrial stripe, however, on image 56 there does appear to be abnormal hypervascularity within the endometrium at the fundus. Right ovary Measurements: 3.8 x 2.1 x 2.7 cm = volume: 11 mL. Normal appearance/no adnexal mass. Preserved vascularity on brief color Doppler. Left ovary Measurements: 2.1 x 1.4 x 3.4 cm = volume: 5 mL. Normal appearance/no adnexal mass. Preserved vascularity on brief color Doppler.  Other findings:  No pelvic free fluid. IMPRESSION: 1. Thickened and heterogeneous endometrium (18 mm) with suggestion of some abnormal endometrial hypervascularity at the fundus. Difficult to exclude retained products of conception. 2. Otherwise within normal limits for the post partum state. Electronically Signed   By: Odessa Fleming M.D.   On: 08/19/2020 07:04   Educated and discussed [place] results with patient - recommends cytotec TID x3 days and around the clock pain medication for the next 2-3 days. Discussed with patient medication schedule - patient request flexeril in addition to medication. Rx for cytotec and flexeril sent to pharmacy of choice.   Discussed reasons to return to MAU. Follow up as scheduled in the office. Return to MAU as needed. Bleeding precautions. Pt stable at time of discharge.   Assessment and Plan   1. Lower abdominal pain   2. Intractable migraine without aura and without status migrainosus   3. Anemia due to acute blood loss   4. Pelvic pain    Discharge home Follow up as scheduled in the office for prenatal care Return to MAU as needed for reasons discussed and/or emergencies  Rx for cytotec and flexeril  Continue other home medication as prescribed     Follow-up Information    [facility] Follow up.   Specialty: Obstetrics and Gynecology Contact information: [address], [address] [place] [address] [phone]             Allergies as of 08/19/2020      Reactions   Benadryl [diphenhydramine Hcl] Other (See Comments)   Causes seizures   Gluten Meal Nausea And Vomiting   Zithromax [azithromycin] Anaphylaxis, Swelling   Lactose Intolerance (gi) Diarrhea      Medication List    TAKE these medications   acetaminophen 500 MG tablet Commonly known as: TYLENOL Take 500 mg by mouth every 6 (six) hours as needed.   cetirizine 10 MG tablet Commonly known as: ZYRTEC TAKE 1 TABLET BY MOUTH EVERY  DAY AS NEEDED FOR ALLERGY   cyclobenzaprine 10 MG tablet Commonly known as: FLEXERIL Take 1 tablet (10 mg total) by mouth 2 (two) times daily as needed for muscle spasms.   ferrous sulfate 325 (65 FE) MG tablet Take 325 mg by mouth daily with breakfast.   ibuprofen 600 MG tablet Commonly known as: ADVIL Take 1 tablet (600 mg total) by mouth every 6 (six) hours as needed.   misoprostol 200 MCG tablet Commonly known as: Cytotec Take 1 tablet (200 mcg total) by mouth in the morning, at noon, and at bedtime for 3 days.   multivitamin-prenatal 27-0.8 MG Tabs tablet Take 1 tablet by mouth daily at 12 noon.   oxyCODONE 5 MG immediate release tablet Commonly known as: Roxicodone Take 1 tablet (5  mg total) by mouth every 4 (four) hours as needed for up to 5 days for severe pain.   promethazine 12.5 MG tablet Commonly known as: PHENERGAN Take 1 tablet (12.5 mg total) by mouth every 6 (six) hours as needed for nausea or vomiting.        Sharyon Cable CNM 08/19/2020, 7:40 AM

## 2020-08-20 LAB — CULTURE, OB URINE: Culture: NO GROWTH

## 2020-08-23 LAB — SURGICAL PATHOLOGY

## 2020-08-25 ENCOUNTER — Ambulatory Visit (INDEPENDENT_AMBULATORY_CARE_PROVIDER_SITE_OTHER): Payer: Medicaid Other | Admitting: Obstetrics and Gynecology

## 2020-08-25 ENCOUNTER — Encounter: Payer: Self-pay | Admitting: Obstetrics and Gynecology

## 2020-08-25 ENCOUNTER — Other Ambulatory Visit: Payer: Self-pay

## 2020-08-25 VITALS — BP 110/78 | HR 95 | Ht 67.0 in | Wt 169.0 lb

## 2020-08-25 DIAGNOSIS — Z8759 Personal history of other complications of pregnancy, childbirth and the puerperium: Secondary | ICD-10-CM | POA: Diagnosis not present

## 2020-08-25 DIAGNOSIS — N939 Abnormal uterine and vaginal bleeding, unspecified: Secondary | ICD-10-CM

## 2020-08-25 DIAGNOSIS — R102 Pelvic and perineal pain: Secondary | ICD-10-CM | POA: Diagnosis not present

## 2020-08-25 DIAGNOSIS — O039 Complete or unspecified spontaneous abortion without complication: Secondary | ICD-10-CM | POA: Insufficient documentation

## 2020-08-25 DIAGNOSIS — O034 Incomplete spontaneous abortion without complication: Secondary | ICD-10-CM

## 2020-08-25 HISTORY — DX: Complete or unspecified spontaneous abortion without complication: O03.9
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
## 2020-08-25 NOTE — Progress Notes (Signed)
Patient presents for F/U SAB per notes.    CC: Bleeding and cramping, pain is 6/10x.

## 2020-08-26 NOTE — Progress Notes (Signed)
Faculty Note  Pt seen with CNM Raelyn Mora and US performed for concern for retained products.  TVUS: no gestational sac note, thin endometrial stripe, ~ 1 cm, no obvious retained products, no vascularity or blood flow noted  Recommend formal TVUS for follow up but likely no retained products at this point, can stop cytotec.  Baldemar Lenis, MD, [facility] Attending Center for [facility])

## 2020-08-31 ENCOUNTER — Ambulatory Visit (HOSPITAL_COMMUNITY)
Admission: EM | Admit: 2020-08-31 | Discharge: 2020-08-31 | Disposition: A | Discharge status: Home / Self Care | Payer: Medicaid Other | Attending: Student | Admitting: Student

## 2020-08-31 ENCOUNTER — Other Ambulatory Visit: Payer: Self-pay

## 2020-08-31 ENCOUNTER — Encounter (HOSPITAL_COMMUNITY): Payer: Self-pay | Admitting: Emergency Medicine

## 2020-08-31 DIAGNOSIS — Z8759 Personal history of other complications of pregnancy, childbirth and the puerperium: Secondary | ICD-10-CM | POA: Diagnosis not present

## 2020-08-31 DIAGNOSIS — J069 Acute upper respiratory infection, unspecified: Secondary | ICD-10-CM | POA: Insufficient documentation

## 2020-08-31 DIAGNOSIS — K649 Unspecified hemorrhoids: Secondary | ICD-10-CM | POA: Diagnosis not present

## 2020-08-31 DIAGNOSIS — Z20822 Contact with and (suspected) exposure to covid-19: Secondary | ICD-10-CM | POA: Diagnosis not present

## 2020-08-31 LAB — POC INFLUENZA A AND B ANTIGEN (URGENT CARE ONLY)
Influenza A Ag: NEGATIVE
Influenza B Ag: NEGATIVE

## 2020-08-31 MED ORDER — HYDROCORTISONE (PERIANAL) 2.5 % EX CREA: 1.0000 "application " | TOPICAL_CREAM | Freq: Two times a day (BID) | CUTANEOUS | 0 refills | Status: DC

## 2020-08-31 MED ORDER — BENZONATATE 100 MG PO CAPS: 100.0000 mg | ORAL_CAPSULE | Freq: Three times a day (TID) | ORAL | 0 refills | Status: DC

## 2020-08-31 MED ORDER — ONDANSETRON HCL 8 MG PO TABS: 8.0000 mg | ORAL_TABLET | Freq: Three times a day (TID) | ORAL | 0 refills | Status: DC | PRN

## 2020-08-31 MED ORDER — PROMETHAZINE-DM 6.25-15 MG/5ML PO SYRP: 5.0000 mL | ORAL_SOLUTION | Freq: Four times a day (QID) | ORAL | 0 refills | Status: DC | PRN

## 2020-08-31 NOTE — ED Notes (Signed)
Poc flu test results- A and B both negative.

## 2020-08-31 NOTE — ED Triage Notes (Signed)
Pt here for cold sx onset 1 week associated w/cough, runny nose, body aches, chills, nauseas  Denies f/v/d  Not taking any meds at the moment  Needing note for work.   A&O X4... NAD.Marland Kitchen. ambulatory

## 2020-08-31 NOTE — Discharge Instructions (Addendum)
-  Zofran as needed for nausea, up to 3x daily  -Anusol cream for hemorrhoids, up to 2x daily -Promethazine DM cough syrup for congestion/cough. This could make you drowsy, so take at night before bed. -Tessalon as needed for cough. Take one pill up to 3x daily (every 8 hours) -For fevers/chills, body aches, headaches- use Tylenol and Ibuprofen. You can alternate these for maximum effect. Use up to 3000mg  Tylenol daily and 3200mg  Ibuprofen daily. Make sure to take ibuprofen with food. Check the bottle of ibuprofen/tylenol for specific dosage instructions. This will help with costochondritis.   We are currently awaiting result of your PCR covid-19 test. This typically comes back in 1-2 days. We'll call you if the result is positive. Otherwise, the result will be sent electronically to your MyChart. You can also call this clinic and ask for your result via telephone.  -Please follow-up with your gynecologist as scheduled/   Please isolate at home while awaiting these results. If your test is positive for Covid-19, continue to isolate at home for 5 days if you have mild symptoms, or a total of 10 days from symptom onset if you have more severe symptoms. If you quarantine for a shorter period of time (i.e. 5 days), make sure to wear a mask until day 10 of symptoms. Treat your symptoms at home with OTC remedies like tylenol/ibuprofen, mucinex, nyquil, etc. Seek medical attention if you develop high fevers, chest pain, shortness of breath, ear pain, facial pain, etc. Make sure to get up and move around every 2-3 hours while convalescing to help prevent blood clots. Drink plenty of fluids, and rest as much as possible.

## 2020-08-31 NOTE — ED Provider Notes (Signed)
[facility]    CSN: [id number] Arrival date & time: 08/31/20  1541      History   Chief Complaint Chief Complaint  Patient presents with  . URI    HPI Melissa [name] is a 20 y.o. female Presenting for URI symptoms for 7 days. History of asthma, anemia, depression, migraine headache, seizures, TBI. Presenting today with cough, congestion, body aches, chills, nausea but denies v/d. Also states she had a miscarriage 3 weeks ago. She is no longer bleeding from this, denies abd pain, vaginal discharge. She is being followed closely by gyn and has an appt for ultrasound tomorrow 09/01/2020. Pt states she has hemorrhoids and these have been uncomfortable due to frequently coughing. Pt states her chest wall hurts with coughing, but denies left-sided chest pain, denies pain at rest. Denies fevers/chills, v/d, shortness of breath, chest pain, facial pain, teeth pain, headaches, sore throat, loss of taste/smell, swollen lymph nodes, ear pain, pelvic pain. Denies hematuria, dysuria, frequency, urgency, back pain, n/v/d/abd pain, fevers/chills, abdnormal vaginal discharge. Denies chest pain, shortness of breath, confusion, high fevers. Denies changes in bowel movements, rectal bleeding. Not vaccinated for covid-19.   HPI  Past Medical History:  Diagnosis Date  . Anemia   . Anxiety   . Asthma    inhaler. last attack June 2019  . Congenital hydronephrosis 2001  . Constipation   . Depression   . Headache    migraines  . PID (pelvic inflammatory disease)   . Seizures ([name])    last one in 2015 - on meds  . TBI (traumatic brain injury) [facility]) 2006    Patient Active Problem List   Diagnosis Date Noted  . Spontaneous miscarriage 08/25/2020  . Migraine variant 08/04/2020  . Abnormal involuntary movements 08/04/2020  . First trimester pregnancy 08/04/2020  . Supervision of high risk pregnancy, antepartum 07/27/2020  . Trichomonal vaginitis during pregnancy in first trimester  07/03/2020  . History of seizures 05/07/2020  . Migraine with aura and without status migrainosus, not intractable 08/12/2019  . Seizures ([name])   . Inadequate oral nutritional intake 04/27/2019  . Sickle cell trait ([name]) 02/13/2019  . Rh negative state in antepartum period 02/05/2019  . 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 DMDD (disruptive mood dysregulation disorder) ([name]) 07/21/2016  . 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 Bilateral low back pain without sciatica 03/08/2016  . Acne vulgaris 08/03/2015  . Chronic constipation 08/03/2015  . 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 Migraine without aura and without status migrainosus, not intractable 04/16/2015  . Episodic tension-type headache, not intractable 04/16/2015  . 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 GAD (generalized anxiety disorder) 11/25/2013  . Suicide attempt by drug ingestion ([name]) 11/18/2013    Past Surgical History:  Procedure Laterality Date  . APPENDECTOMY    . HERNIA REPAIR    . INTESTINAL MALROTATION REPAIR  2001    OB History    Gravida  2   Para  1   Term  1   Preterm      AB  1   Living  1     SAB      IAB      Ectopic      Multiple  0   Live Births  1            Home Medications    Prior to Admission medications   Medication Sig  Start Date End Date Taking? Authorizing Provider  benzonatate (TESSALON) 100 MG capsule Take 1 capsule (100 mg total) by mouth every 8 (eight) hours. 08/31/20  Yes Rhys MartiniGraham, Renisha Cockrum E, PA-C  hydrocortisone (ANUSOL-HC) 2.5 % rectal cream Place 1 application rectally 2 (two) times daily. 08/31/20  Yes Rhys MartiniGraham, Lujuana Kapler E, PA-C  ondansetron (ZOFRAN) 8 MG tablet Take 1 tablet (8 mg total) by mouth every 8 (eight) hours as needed for nausea or vomiting. 08/31/20  Yes Rhys MartiniGraham, Lorean Ekstrand E, PA-C  promethazine-dextromethorphan (PROMETHAZINE-DM) 6.25-15 MG/5ML syrup Take 5  mLs by mouth 4 (four) times daily as needed for cough. 08/31/20  Yes Rhys MartiniGraham, Jannine Abreu E, PA-C  acetaminophen (TYLENOL) 500 MG tablet Take 500 mg by mouth every 6 (six) hours as needed.    [provider]  cetirizine (ZYRTEC) 10 MG tablet TAKE 1 TABLET BY MOUTH EVERY DAY AS NEEDED FOR ALLERGY 01/23/20   Florestine AversHanvey, [place], MD  cyclobenzaprine (FLEXERIL) 10 MG tablet Take 1 tablet (10 mg total) by mouth 2 (two) times daily as needed for muscle spasms. 08/19/20   Sharyon Cableogers, Veronica C, CNM  ferrous sulfate 325 (65 FE) MG tablet Take 325 mg by mouth daily with breakfast.    [provider]  ibuprofen (ADVIL) 600 MG tablet Take 1 tablet (600 mg total) by mouth every 6 (six) hours as needed. 08/17/20   Raelyn Moraawson, Rolitta, CNM  misoprostol (CYTOTEC) 200 MCG tablet Take 1 tablet (200 mcg total) by mouth in the morning, at noon, and at bedtime for 3 days. 08/19/20 08/22/20  Sharyon Cableogers, Veronica C, CNM  Prenatal Vit-Fe Fumarate-FA (MULTIVITAMIN-PRENATAL) 27-0.8 MG TABS tablet Take 1 tablet by mouth daily at 12 noon. Patient not taking: Reported on 08/25/2020    [provider]  promethazine (PHENERGAN) 12.5 MG tablet Take 1 tablet (12.5 mg total) by mouth every 6 (six) hours as needed for nausea or vomiting. 08/17/20   Raelyn Moraawson, Rolitta, CNM  rizatriptan (MAXALT-MLT) 10 MG disintegrating tablet Take 1 tablet at onset of migraine with 2 ibuprofen may repeat an additional tablet in 2 hours if needed 05/07/20 06/09/20  Deetta PerlaHickling, William H, MD    Family History Family History  Problem Relation Age of Onset  . Depression Mother   . Stroke Mother   . Obesity Mother   . Post-traumatic stress disorder Mother   . Anxiety disorder Mother   . Hypertension Mother   . Diabetes Mother   . Schizophrenia Father   . Kidney disease Father     Social History Social History   Tobacco Use  . Smoking status: Former Smoker    Types: Cigarettes, Cigars  . Smokeless tobacco: Never Used  . Tobacco comment: black and  milds, none since + UPT  Vaping Use  . Vaping Use: Never used  Substance Use Topics  . Alcohol use: Never  . Drug use: Not Currently    Types: Marijuana    Comment: last 2020     Allergies   Benadryl [diphenhydramine hcl], Gluten meal, Zithromax [azithromycin], and Lactose intolerance (gi)   Review of Systems Review of Systems  Constitutional: Negative for appetite change, chills and fever.  HENT: Positive for congestion. Negative for ear pain, rhinorrhea, sinus pressure, sinus pain and sore throat.   Eyes: Negative for redness and visual disturbance.  Respiratory: Positive for cough. Negative for chest tightness, shortness of breath and wheezing.   Cardiovascular: Negative for chest pain and palpitations.  Gastrointestinal: Positive for nausea. Negative for abdominal pain, constipation, diarrhea and vomiting.  Genitourinary: Negative  for decreased urine volume, difficulty urinating, dysuria, frequency, genital sores, hematuria, menstrual problem, pelvic pain, urgency, vaginal bleeding, vaginal discharge and vaginal pain.       Hemorrhoid pain  Musculoskeletal: Positive for myalgias.  Neurological: Negative for dizziness, weakness and headaches.  Psychiatric/Behavioral: Negative for confusion.  All other systems reviewed and are negative.    Physical Exam Triage Vital Signs ED Triage Vitals  Enc Vitals Group     BP 08/31/20 1633 105/74     Pulse Rate 08/31/20 1633 95     Resp 08/31/20 1633 18     Temp 08/31/20 1633 98.1 F (36.7 C)     Temp Source 08/31/20 1633 Oral     SpO2 08/31/20 1633 98 %     Weight --      Height --      Head Circumference --      Peak Flow --      Pain Score 08/31/20 1635 8     Pain Loc --      Pain Edu? --      Excl. in GC? --    No data found.  Updated Vital Signs BP 105/74 (BP Location: Left Arm)   Pulse 95   Temp 98.1 F (36.7 C) (Oral)   Resp 18   SpO2 98%   Visual Acuity Right Eye Distance:   Left Eye Distance:   Bilateral  Distance:    Right Eye Near:   Left Eye Near:    Bilateral Near:     Physical Exam Vitals reviewed.  Constitutional:      General: She is not in acute distress.    Appearance: Normal appearance. She is not ill-appearing.  HENT:     Head: Normocephalic and atraumatic.     Right Ear: Hearing, tympanic membrane, ear canal and external ear normal. No swelling or tenderness. There is no impacted cerumen. No mastoid tenderness. Tympanic membrane is not perforated, erythematous, retracted or bulging.     Left Ear: Hearing, tympanic membrane, ear canal and external ear normal. No swelling or tenderness. There is no impacted cerumen. No mastoid tenderness. Tympanic membrane is not perforated, erythematous, retracted or bulging.     Nose:     Right Sinus: No maxillary sinus tenderness or frontal sinus tenderness.     Left Sinus: No maxillary sinus tenderness or frontal sinus tenderness.     Mouth/Throat:     Mouth: Mucous membranes are moist.     Pharynx: Uvula midline. Posterior oropharyngeal erythema present. No oropharyngeal exudate.     Tonsils: No tonsillar exudate.  Cardiovascular:     Rate and Rhythm: Normal rate and regular rhythm.     Heart sounds: Normal heart sounds.  Pulmonary:     Breath sounds: Normal breath sounds and air entry. No wheezing, rhonchi or rales.  Chest:     Chest wall: No tenderness.     Comments: Chest wall pain with deep palpation along bilateral sternal border.  Abdominal:     General: Abdomen is flat. Bowel sounds are normal.     Tenderness: There is no abdominal tenderness. There is no right CVA tenderness, left CVA tenderness, guarding or rebound. Negative signs include Murphy's sign, Rovsing's sign and McBurney's sign.  Lymphadenopathy:     Cervical: No cervical adenopathy.  Neurological:     General: No focal deficit present.     Mental Status: She is alert and oriented to person, place, and time.  Psychiatric:  Attention and Perception:  Attention and perception normal.        Mood and Affect: Mood and affect normal.        Behavior: Behavior normal. Behavior is cooperative.        Thought Content: Thought content normal.        Judgment: Judgment normal.      UC Treatments / Results  Labs (all labs ordered are listed, but only abnormal results are displayed) Labs Reviewed  SARS CORONAVIRUS 2 (TAT 6-24 HRS)    EKG   Radiology No results found.  Procedures Procedures (including critical care time)  Medications Ordered in UC Medications - No data to display  Initial Impression / Assessment and Plan / UC Course  I have reviewed the triage vital signs and the nursing notes.  Pertinent labs & imaging results that were available during my care of the patient were reviewed by me and considered in my medical decision making (see chart for details).     Afebrile nontachycardic nontachypneic, oxygenating well on room air.   Covid test sent today. Patient is not vaccinated for covid-19. Isolation precautions per CDC guidelines until negative result. Symptomatic relief with OTC Mucinex, Nyquil, etc. Also promethazine, tessalon as below. Return precautions- new/worsening fevers/chills, shortness of breath, chest pain, abd pain, etc. zofran for nausea.  Follow-up with your gynecologist as scheduled in 1 day. Pt without abdominal tenderness, fevers, vaginal discharge/bleeding at this visit. Head straight to ED if new/worsening abdominal pain, new/worsening fevers, new/worsening vaginal bleeding/discharge, etc. Pt verbalizes understanding and agreement.  She is not currently pregnant or breastfeeding. Anusol for hemorrhoids.   ER note and labwork 08/19/2020 reviewed.   Final Clinical Impressions(s) / UC Diagnoses   Final diagnoses:  Acute upper respiratory infection  Hemorrhoids, unspecified hemorrhoid type  History of miscarriage     Discharge Instructions     -Zofran as needed for nausea, up to 3x daily   -Anusol cream for hemorrhoids, up to 2x daily -Promethazine DM cough syrup for congestion/cough. This could make you drowsy, so take at night before bed. -Tessalon as needed for cough. Take one pill up to 3x daily (every 8 hours) -For fevers/chills, body aches, headaches- use Tylenol and Ibuprofen. You can alternate these for maximum effect. Use up to 3000mg  Tylenol daily and 3200mg  Ibuprofen daily. Make sure to take ibuprofen with food. Check the bottle of ibuprofen/tylenol for specific dosage instructions. This will help with costochondritis.   We are currently awaiting result of your PCR covid-19 test. This typically comes back in 1-2 days. We'll call you if the result is positive. Otherwise, the result will be sent electronically to your MyChart. You can also call this clinic and ask for your result via telephone.  -Please follow-up with your gynecologist as scheduled/   Please isolate at home while awaiting these results. If your test is positive for Covid-19, continue to isolate at home for 5 days if you have mild symptoms, or a total of 10 days from symptom onset if you have more severe symptoms. If you quarantine for a shorter period of time (i.e. 5 days), make sure to wear a mask until day 10 of symptoms. Treat your symptoms at home with OTC remedies like tylenol/ibuprofen, mucinex, nyquil, etc. Seek medical attention if you develop high fevers, chest pain, shortness of breath, ear pain, facial pain, etc. Make sure to get up and move around every 2-3 hours while convalescing to help prevent blood clots. Drink plenty of fluids, and rest  as much as possible.     ED Prescriptions    Medication Sig Dispense Auth. Provider   hydrocortisone (ANUSOL-HC) 2.5 % rectal cream Place 1 application rectally 2 (two) times daily. 30 g Ignacia BayleyGraham, Taisley Mordan E, PA-C   ondansetron (ZOFRAN) 8 MG tablet Take 1 tablet (8 mg total) by mouth every 8 (eight) hours as needed for nausea or vomiting. 20 tablet Rhys MartiniGraham, Ghina Bittinger  E, PA-C   promethazine-dextromethorphan (PROMETHAZINE-DM) 6.25-15 MG/5ML syrup Take 5 mLs by mouth 4 (four) times daily as needed for cough. 118 mL Rhys MartiniGraham, Thy Gullikson E, PA-C   benzonatate (TESSALON) 100 MG capsule Take 1 capsule (100 mg total) by mouth every 8 (eight) hours. 21 capsule Rhys MartiniGraham, Mortimer Bair E, PA-C     PDMP not reviewed this encounter.   Rhys MartiniGraham, Sachin Ferencz E, PA-C 08/31/20 1723

## 2020-09-01 ENCOUNTER — Inpatient Hospital Stay: Admission: RE | Admit: 2020-09-01 | Payer: Medicaid Other | Source: Ambulatory Visit

## 2020-09-01 LAB — SARS CORONAVIRUS 2 (TAT 6-24 HRS): SARS Coronavirus 2: NEGATIVE

## 2020-09-07 ENCOUNTER — Encounter (HOSPITAL_COMMUNITY): Payer: Self-pay | Admitting: Obstetrics and Gynecology

## 2020-09-07 ENCOUNTER — Inpatient Hospital Stay (HOSPITAL_COMMUNITY)
Admission: AD | Admit: 2020-09-07 | Discharge: 2020-09-08 | Disposition: A | Discharge status: Home / Self Care | Payer: Medicaid Other | Attending: Obstetrics and Gynecology | Admitting: Obstetrics and Gynecology

## 2020-09-07 ENCOUNTER — Other Ambulatory Visit: Payer: Self-pay

## 2020-09-07 DIAGNOSIS — F17210 Nicotine dependence, cigarettes, uncomplicated: Secondary | ICD-10-CM | POA: Diagnosis not present

## 2020-09-07 DIAGNOSIS — Z711 Person with feared health complaint in whom no diagnosis is made: Secondary | ICD-10-CM | POA: Insufficient documentation

## 2020-09-07 DIAGNOSIS — O034 Incomplete spontaneous abortion without complication: Secondary | ICD-10-CM

## 2020-09-07 DIAGNOSIS — M549 Dorsalgia, unspecified: Secondary | ICD-10-CM | POA: Diagnosis present

## 2020-09-07 DIAGNOSIS — O039 Complete or unspecified spontaneous abortion without complication: Secondary | ICD-10-CM | POA: Diagnosis present

## 2020-09-07 DIAGNOSIS — R102 Pelvic and perineal pain: Secondary | ICD-10-CM | POA: Diagnosis not present

## 2020-09-07 MED ORDER — IBUPROFEN 600 MG PO TABS
600.0000 mg | ORAL_TABLET | Freq: Once | ORAL | Status: AC
  Administered 2020-09-08: 600 mg via ORAL
  Filled 2020-09-07: qty 1

## 2020-09-07 NOTE — MAU Provider Note (Signed)
Chief Complaint:  Back Pain   Event Date/Time   First Provider Initiated Contact with Patient 09/07/20 2352      HPI: Melissa [name] is a 20 y.o. G2P1011 who presents to maternity admissions reporting feeling like she has "sepsis"   Had a 14 week miscarriage 3 weeks ago.  States had a fever today "98 and the room was warm but I felt cold".  States has pelvic cramping and foul discharge.  . She reports vaginal bleeding, no vaginal itching/burning, urinary symptoms, h/a, dizziness, n/v Admitted to prior provider that she had anal and vaginal intercourse twice since miscarriage. Was worried that caused infection in her uterus.  Has IUD placement planned 09/22/20.  Back Pain This is a new problem. The current episode started yesterday. The problem occurs constantly. The problem is unchanged. The pain is present in the lumbar spine. The quality of the pain is described as aching. The pain does not radiate. Associated symptoms include abdominal pain, a fever (98 degrees) and pelvic pain. Pertinent negatives include no dysuria, headaches, numbness or paresis. She has tried nothing for the symptoms.   RN Note: Pt reports she had a miscarriage three weeks ago and feels like she is experiencing an infection in her blood. Pt reports cramping and feels her heart is beating fast. Pt also reports foul smelling vaginal discharge. Pt reports back pain. Pt reports spotting that started last week.   Past Medical History: Past Medical History:  Diagnosis Date  . Anemia   . Anxiety   . Asthma    inhaler. last attack June 2019  . Congenital hydronephrosis 2001  . Constipation   . Depression   . Headache    migraines  . PID (pelvic inflammatory disease)   . Seizures ([name])    last one in 2015 - on meds  . TBI (traumatic brain injury) ([name]) 2006    Past obstetric history: OB History  Gravida Para Term Preterm AB Living  2 1 1   1 1   SAB IAB Ectopic Multiple Live Births        0 1    # Outcome  Date GA Lbr Len/2nd Weight Sex Delivery Anes PTL Lv  2 AB 08/17/20 [redacted]w[redacted]d    SAB     1 Term 08/20/19 [redacted]w[redacted]d 19:04 / 00:10 3249 g F Vag-Spont EPI  LIV    Past Surgical History: Past Surgical History:  Procedure Laterality Date  . APPENDECTOMY    . HERNIA REPAIR    . INTESTINAL MALROTATION REPAIR  2001    Family History: Family History  Problem Relation Age of Onset  . Depression Mother   . Stroke Mother   . Obesity Mother   . Post-traumatic stress disorder Mother   . Anxiety disorder Mother   . Hypertension Mother   . Diabetes Mother   . Schizophrenia Father   . Kidney disease Father     Social History: Social History   Tobacco Use  . Smoking status: Current Every Day Smoker    Types: Cigarettes, Cigars  . Smokeless tobacco: Never Used  . Tobacco comment: black and milds, none since + UPT  Vaping Use  . Vaping Use: Never used  Substance Use Topics  . Alcohol use: Never  . Drug use: Not Currently    Types: Marijuana    Comment: last 2020    Allergies:  Allergies  Allergen Reactions  . Benadryl [Diphenhydramine Hcl] Other (See Comments)    Causes seizures   .  Gluten Meal Nausea And Vomiting  . Zithromax [Azithromycin] Anaphylaxis and Swelling  . Lactose Intolerance (Gi) Diarrhea    Meds:  Medications Prior to Admission  Medication Sig Dispense Refill Last Dose  . acetaminophen (TYLENOL) 500 MG tablet Take 500 mg by mouth every 6 (six) hours as needed.   Past Week at Unknown time  . benzonatate (TESSALON) 100 MG capsule Take 1 capsule (100 mg total) by mouth every 8 (eight) hours. 21 capsule 0 Past Week at Unknown time  . cyclobenzaprine (FLEXERIL) 10 MG tablet Take 1 tablet (10 mg total) by mouth 2 (two) times daily as needed for muscle spasms. 10 tablet 0 Past Month at Unknown time  . ferrous sulfate 325 (65 FE) MG tablet Take 325 mg by mouth daily with breakfast.   09/06/2020 at Unknown time  . hydrocortisone (ANUSOL-HC) 2.5 % rectal cream Place 1 application  rectally 2 (two) times daily. 30 g 0 09/07/2020 at Unknown time  . ibuprofen (ADVIL) 600 MG tablet Take 1 tablet (600 mg total) by mouth every 6 (six) hours as needed. 60 tablet 0 Past Week at Unknown time  . promethazine (PHENERGAN) 12.5 MG tablet Take 1 tablet (12.5 mg total) by mouth every 6 (six) hours as needed for nausea or vomiting. 30 tablet 0 Past Month at Unknown time  . cetirizine (ZYRTEC) 10 MG tablet TAKE 1 TABLET BY MOUTH EVERY DAY AS NEEDED FOR ALLERGY 30 tablet 11 More than a month at Unknown time  . misoprostol (CYTOTEC) 200 MCG tablet Take 1 tablet (200 mcg total) by mouth in the morning, at noon, and at bedtime for 3 days. 9 tablet 0   . ondansetron (ZOFRAN) 8 MG tablet Take 1 tablet (8 mg total) by mouth every 8 (eight) hours as needed for nausea or vomiting. 20 tablet 0 More than a month at Unknown time  . Prenatal Vit-Fe Fumarate-FA (MULTIVITAMIN-PRENATAL) 27-0.8 MG TABS tablet Take 1 tablet by mouth daily at 12 noon. (Patient not taking: Reported on 08/25/2020)     . promethazine-dextromethorphan (PROMETHAZINE-DM) 6.25-15 MG/5ML syrup Take 5 mLs by mouth 4 (four) times daily as needed for cough. 118 mL 0     I have reviewed patient's Past Medical Hx, Surgical Hx, Family Hx, Social Hx, medications and allergies.  ROS:  Review of Systems  Constitutional: Positive for fever (98 degrees).  Gastrointestinal: Positive for abdominal pain.  Genitourinary: Positive for pelvic pain. Negative for dysuria.  Musculoskeletal: Positive for back pain.  Neurological: Negative for numbness and headaches.   Other systems negative     Physical Exam   Patient Vitals for the past 24 hrs:  BP Temp Temp src Pulse Resp SpO2 Weight  09/07/20 2339 116/71 -- -- 89 -- -- --  09/07/20 2337 -- 98.7 F (37.1 C) Oral -- 12 100 % --  09/07/20 2327 -- -- -- -- -- -- 78.6 kg   Constitutional: Well-developed, well-nourished female in no acute distress.  Cardiovascular: normal rate and rhythm, no  ectopy audible, S1 & S2 heard, no murmur Respiratory: normal effort, no distress. Lungs CTAB with no wheezes or crackles GI: Abd soft, non-tender.  Nondistended.  No rebound, No guarding.  Bowel Sounds audible  MS: Extremities nontender, no edema, normal ROM Neurologic: Alert and oriented x 4.   Grossly nonfocal. GU: Neg CVAT. Skin:  Warm and Dry Psych:  Affect appropriate.  PELVIC EXAM: Bimanual exam: Cervix firm, anterior, neg CMT, uterus slightly tender, nonenlarged, adnexa without tenderness, enlargement, or mass  Exam is limited by body habitus.    Labs: Results for orders placed or performed during the hospital encounter of 09/07/20 (from the past 24 hour(s))  CBC with Differential/Platelet     Status: Abnormal   Collection Time: 09/07/20 11:45 PM  Result Value Ref Range   WBC 7.0 4.0 - 10.5 K/uL   RBC 4.21 3.87 - 5.11 MIL/uL   Hemoglobin 11.9 (L) 12.0 - 15.0 g/dL   HCT 01.6 01.0 - 93.2 %   MCV 86.0 80.0 - 100.0 fL   MCH 28.3 26.0 - 34.0 pg   MCHC 32.9 30.0 - 36.0 g/dL   RDW 35.5 (H) 73.2 - 20.2 %   Platelets 240 150 - 400 K/uL   nRBC 0.0 0.0 - 0.2 %   Neutrophils Relative % 43 %   Neutro Abs 3.0 1.7 - 7.7 K/uL   Lymphocytes Relative 45 %   Lymphs Abs 3.1 0.7 - 4.0 K/uL   Monocytes Relative 10 %   Monocytes Absolute 0.7 0.1 - 1.0 K/uL   Eosinophils Relative 2 %   Eosinophils Absolute 0.2 0.0 - 0.5 K/uL   Basophils Relative 0 %   Basophils Absolute 0.0 0.0 - 0.1 K/uL   Immature Granulocytes 0 %   Abs Immature Granulocytes 0.02 0.00 - 0.07 K/uL  hCG, quantitative, pregnancy     Status: Abnormal   Collection Time: 09/07/20 11:45 PM  Result Value Ref Range   hCG, Beta Chain, Quant, S 20 (H) <5 mIU/mL  Urinalysis, Routine w reflex microscopic Urine, Clean Catch     Status: Abnormal   Collection Time: 09/07/20 11:55 PM  Result Value Ref Range   Color, Urine YELLOW YELLOW   APPearance HAZY (A) CLEAR   Specific Gravity, Urine 1.019 1.005 - 1.030   pH 6.0 5.0 - 8.0    Glucose, UA NEGATIVE NEGATIVE mg/dL   Hgb urine dipstick NEGATIVE NEGATIVE   Bilirubin Urine NEGATIVE NEGATIVE   Ketones, ur NEGATIVE NEGATIVE mg/dL   Protein, ur NEGATIVE NEGATIVE mg/dL   Nitrite NEGATIVE NEGATIVE   Leukocytes,Ua NEGATIVE NEGATIVE    --/--/O NEG (11/13 0249)  Imaging:  US PELVIS TRANSVAGINAL NON-OB (TV ONLY)  Result Date: 09/08/2020 CLINICAL DATA:  Initial evaluation for acute pelvic pain, fever, recent SAB. EXAM: ULTRASOUND PELVIS TRANSVAGINAL TECHNIQUE: Transvaginal ultrasound examination of the pelvis was performed including evaluation of the uterus, ovaries, adnexal regions, and pelvic cul-de-sac. COMPARISON:  Prior ultrasound from 08/19/2020. FINDINGS: Uterus Measurements: 10.6 x 5.6 x 7.3 cm = volume: 224.9 mL. Uterus is anteverted. Endometrium Endometrial stripe remains thickened up to 11 mm, previously 18 mm on 08/19/2020. Suggestion of possible associated vascularity at the mid uterine body (image 18). Trace simple fluid noted within the endometrial cavity. Right ovary Measurements: 5.4 x 2.7 x 4.1 cm = volume: 31.7 mL. 3.6 x 2.8 x 2.5 cm simple cyst. This has benign features, and most likely reflects a normal physiologic follicular cyst. No follow-up imaging recommended. Note: This recommendation does not apply to premenarchal patients or to those with increased risk (genetic, family history, elevated tumor markers or other high-risk factors) of ovarian cancer. Reference: Radiology 2019 Nov; 293(2):359-371. no other adnexal mass. Left ovary Measurements: 2.5 x 1.6 x 2.3 cm = volume: 4.8 mL. Normal appearance/no adnexal mass. Other findings:  No abnormal free fluid IMPRESSION: 1. Endometrial stripe remains thickened up to 11 mm with suggestion of associated vascularity at the level of the mid uterine body, and trace simple fluid within the endometrial cavity. Difficult to  exclude possible persistent retained products of conception. Possible endometritis could also be  considered in the correct clinical setting. 2. Otherwise normal pelvic ultrasound for age. Electronically Signed   By: Rise MuBenjamin  McClintock M.D.   On: 09/08/2020 00:43     MAU Course/MDM: I have ordered labs as follows:  See above. Reassured patient there is no leukocytosis, which indicates infection is unlikely.  UA is clear.  HCg is 20, which is c/w post SAB per Dr Jolayne Pantheronstant. Imaging ordered: none Results reviewed.   Consult Dr Jolayne Pantheronstant with US findings.   Treatments in MAU included ibuprofen which completely relieved her cramping..   Pt stable at time of discharge.  Assessment: Post SAB x 3 weeks Pelvic cramping Worried but well  Plan: Discharge home Recommend Abstain from intercourse until IUD inserted Ibuprofen for cramping  Encouraged to return here or to other Urgent Care/ED if she develops worsening of symptoms, increase in pain, fever, or other concerning symptoms.   Wynelle BourgeoisMarie Chia Mowers CNM, MSN Certified Nurse-Midwife 09/07/2020 11:52 PM

## 2020-09-07 NOTE — MAU Note (Signed)
Pt reports she had a miscarriage three weeks ago and feels like she is experiencing an infection in her blood. Pt reports cramping and feels her heart is beating fast. Pt also reports foul smelling vaginal discharge. Pt reports back pain. Pt reports spotting that started last week.

## 2020-09-08 ENCOUNTER — Inpatient Hospital Stay (HOSPITAL_COMMUNITY): Payer: Medicaid Other

## 2020-09-08 LAB — CBC WITH DIFFERENTIAL/PLATELET
Abs Immature Granulocytes: 0.02 10*3/uL (ref 0.00–0.07)
Basophils Absolute: 0 10*3/uL (ref 0.0–0.1)
Basophils Relative: 0 %
Eosinophils Absolute: 0.2 10*3/uL (ref 0.0–0.5)
Eosinophils Relative: 2 %
HCT: 36.2 % (ref 36.0–46.0)
Hemoglobin: 11.9 g/dL — ABNORMAL LOW (ref 12.0–15.0)
Immature Granulocytes: 0 %
Lymphocytes Relative: 45 %
Lymphs Abs: 3.1 10*3/uL (ref 0.7–4.0)
MCH: 28.3 pg (ref 26.0–34.0)
MCHC: 32.9 g/dL (ref 30.0–36.0)
MCV: 86 fL (ref 80.0–100.0)
Monocytes Absolute: 0.7 10*3/uL (ref 0.1–1.0)
Monocytes Relative: 10 %
Neutro Abs: 3 10*3/uL (ref 1.7–7.7)
Neutrophils Relative %: 43 %
Platelets: 240 10*3/uL (ref 150–400)
RBC: 4.21 MIL/uL (ref 3.87–5.11)
RDW: 17.2 % — ABNORMAL HIGH (ref 11.5–15.5)
WBC: 7 10*3/uL (ref 4.0–10.5)
nRBC: 0 % (ref 0.0–0.2)

## 2020-09-08 LAB — HCG, QUANTITATIVE, PREGNANCY: hCG, Beta Chain, Quant, S: 20 m[IU]/mL — ABNORMAL HIGH (ref <5)

## 2020-09-08 LAB — URINALYSIS, ROUTINE W REFLEX MICROSCOPIC
Bilirubin Urine: NEGATIVE
Glucose, UA: NEGATIVE mg/dL
Hgb urine dipstick: NEGATIVE
Ketones, ur: NEGATIVE mg/dL
Leukocytes,Ua: NEGATIVE
Nitrite: NEGATIVE
Protein, ur: NEGATIVE mg/dL
Specific Gravity, Urine: 1.019 (ref 1.005–1.030)
pH: 6 (ref 5.0–8.0)

## 2020-09-08 NOTE — Discharge Instructions (Signed)
Miscarriage A miscarriage is the loss of pregnancy before the 20th week. Most miscarriages happen during the first 3 months of pregnancy. Sometimes, a miscarriage can happen before a woman knows that she is pregnant. Having a miscarriage can be an emotional experience. If you have had a miscarriage, talk with your health care provider about any questions you may have about the loss of your baby, the grieving process, and your plans for future pregnancy. What are the causes? Many times, the cause of a miscarriage is not known. What increases the risk? The following factors may make a pregnant woman more likely to have a miscarriage: Certain medical conditions  Conditions that affect the hormone balance in the body, such as thyroid disease or polycystic ovary syndrome.  Diabetes.  Autoimmune disorders.  Infections.  Bleeding disorders.  Obesity. Lifestyle factors  Using products with tobacco or nicotine in them or being exposed to tobacco smoke.  Having alcohol.  Having large amounts of caffeine.  Recreational drug use. Problems with reproductive organs or structures  Cervical insufficiency. This is when the lowest part of the uterus (cervix) opens and thins before pregnancy is at term.  Having a condition called Asherman syndrome. This syndrome causes scarring in the uterus or causes the uterus to be abnormal in structure.  Fibrous growths, called fibroids, in the uterus.  Congenital abnormalities. These problems are present at birth.  Infection of the cervix or uterus. Personal or medical history  Injury (trauma).  Having had a miscarriage before.  Being younger than age 18 or older than age 35.  Exposure to harmful substances in the environment. This may include radiation or heavy metals, such as lead.  Use of certain medicines. What are the signs or symptoms? Symptoms of this condition include:  Vaginal bleeding or spotting, with or without cramps or  pain.  Pain or cramping in the abdomen or lower back.  Fluid or tissue coming out of the vagina. How is this diagnosed? This condition may be diagnosed based on:  A physical exam.  Ultrasound.  Lab tests, such as blood tests, urine tests, or swabs for infection. How is this treated? Treatment for a miscarriage is sometimes not needed if all the pregnancy tissue that was in the uterus comes out on its own, and there are no other problems such as infection or heavy bleeding. In other cases, this condition may be treated with:  Dilation and curettage (D&C). In this procedure, the cervix is stretched open and any remaining pregnancy tissue is removed from the lining of the uterus (endometrium).  Medicines. These may include: ? Antibiotic medicine, to treat infection. ? Medicine to help any remaining pregnancy tissue come out of the body. ? Medicine to reduce (contract) the size of the uterus. These medicines may be given if there is a lot of bleeding. If you have Rh-negative blood, you may be given an injection of a medicine called Rho(D) immune globulin. This medicine helps prevent problems with future pregnancies. Follow these instructions at home: Medicines  Take over-the-counter and prescription medicines only as told by your health care provider.  If you were prescribed antibiotic medicine, take it as told by your health care provider. Do not stop taking the antibiotic even if you start to feel better. Activity  Rest as told by your health care provider. Ask your health care provider what activities are safe for you.  Have someone help with home and family responsibilities during this time. General instructions  Monitor how much tissue   or blood clot material comes out of the vagina.  Do not have sex, douche, or put anything, such as tampons, in your vagina until your health care provider says it is okay.  To help you and your partner with the grieving process, talk with your  health care provider or get counseling.  When you are ready, meet with your health care provider to discuss any important steps you should take for your health. Also, discuss steps you should take to have a healthy pregnancy in the future.  Keep all follow-up visits. This is important.   Where to find more information  The American College of Obstetricians and Gynecologists: acog.org  [place] Department of Health and Human Services Office of Women's Health: hrsa.gov/office-womens-health Contact a health care provider if:  You have a fever or chills.  There is bad-smelling fluid coming from the vagina.  You have more bleeding instead of less.  Tissue or blood clots come out of your vagina. Get help right away if:  You have severe cramps or pain in your back or abdomen.  Heavy bleeding soaks through 2 large sanitary pads an hour for more than 2 hours.  You become light-headed or weak.  You faint.  You feel sad, and your sadness takes over your thoughts.  You think about hurting yourself. If you ever feel like you may hurt yourself or others, or have thoughts about taking your own life, get help right away. Go to your nearest emergency department or:  Call your local emergency services (911 in the [place]  Call a suicide crisis helpline, such as the National Suicide Prevention Lifeline at 1-800-273-8255. This is open 24 hours a day in the [place]  Text the Crisis Text Line at 741741 (in the [place] Summary  Most miscarriages happen in the first 3 months of pregnancy. Sometimes miscarriage happens before a woman knows that she is pregnant.  Follow instructions from your health care provider about medicines and activity.  To help you and your partner with grieving, talk with your health care provider or get counseling.  Keep all follow-up visits. This information is not intended to replace advice given to you by your health care provider. Make sure you discuss any questions you  have with your health care provider. Document Revised: 02/06/2020 Document Reviewed: 02/06/2020 Elsevier Patient Education  2021 [employer]  

## 2020-09-08 NOTE — MAU Note (Signed)
Wynelle Bourgeois, CNM had discussed discharge with patient prior to patient departure.

## 2020-09-08 NOTE — MAU Note (Signed)
RN entered room with discharge paperwork to find that pt had left facility without discharge vitals, signature or pt education.

## 2020-09-10 ENCOUNTER — Emergency Department (HOSPITAL_COMMUNITY)
Admission: EM | Admit: 2020-09-10 | Discharge: 2020-09-10 | Disposition: A | Discharge status: Home / Self Care | Payer: Medicaid Other | Attending: Emergency Medicine | Admitting: Emergency Medicine

## 2020-09-10 ENCOUNTER — Encounter (HOSPITAL_COMMUNITY): Payer: Self-pay | Admitting: Emergency Medicine

## 2020-09-10 ENCOUNTER — Emergency Department (HOSPITAL_COMMUNITY): Payer: Medicaid Other

## 2020-09-10 ENCOUNTER — Other Ambulatory Visit: Payer: Self-pay

## 2020-09-10 DIAGNOSIS — T7411XA Adult physical abuse, confirmed, initial encounter: Secondary | ICD-10-CM | POA: Diagnosis present

## 2020-09-10 DIAGNOSIS — F17210 Nicotine dependence, cigarettes, uncomplicated: Secondary | ICD-10-CM | POA: Diagnosis not present

## 2020-09-10 DIAGNOSIS — J45909 Unspecified asthma, uncomplicated: Secondary | ICD-10-CM | POA: Insufficient documentation

## 2020-09-10 DIAGNOSIS — R202 Paresthesia of skin: Secondary | ICD-10-CM | POA: Insufficient documentation

## 2020-09-10 LAB — CBC WITH DIFFERENTIAL/PLATELET
Abs Immature Granulocytes: 0.03 10*3/uL (ref 0.00–0.07)
Basophils Absolute: 0 10*3/uL (ref 0.0–0.1)
Basophils Relative: 1 %
Eosinophils Absolute: 0.1 10*3/uL (ref 0.0–0.5)
Eosinophils Relative: 2 %
HCT: 37.5 % (ref 36.0–46.0)
Hemoglobin: 12.6 g/dL (ref 12.0–15.0)
Immature Granulocytes: 1 %
Lymphocytes Relative: 32 %
Lymphs Abs: 1.5 10*3/uL (ref 0.7–4.0)
MCH: 29.2 pg (ref 26.0–34.0)
MCHC: 33.6 g/dL (ref 30.0–36.0)
MCV: 87 fL (ref 80.0–100.0)
Monocytes Absolute: 0.5 10*3/uL (ref 0.1–1.0)
Monocytes Relative: 12 %
Neutro Abs: 2.5 10*3/uL (ref 1.7–7.7)
Neutrophils Relative %: 52 %
Platelets: 204 10*3/uL (ref 150–400)
RBC: 4.31 MIL/uL (ref 3.87–5.11)
RDW: 16.7 % — ABNORMAL HIGH (ref 11.5–15.5)
WBC: 4.7 10*3/uL (ref 4.0–10.5)
nRBC: 0 % (ref 0.0–0.2)

## 2020-09-10 LAB — PREGNANCY, URINE: Preg Test, Ur: NEGATIVE

## 2020-09-10 LAB — BASIC METABOLIC PANEL
Anion gap: 8 (ref 5–15)
BUN: 6 mg/dL (ref 6–20)
CO2: 24 mmol/L (ref 22–32)
Calcium: 9.5 mg/dL (ref 8.9–10.3)
Chloride: 105 mmol/L (ref 98–111)
Creatinine, Ser: 0.62 mg/dL (ref 0.44–1.00)
GFR, Estimated: >60 mL/min (ref >60)
Glucose, Bld: 89 mg/dL (ref 70–99)
Potassium: 3.8 mmol/L (ref 3.5–5.1)
Sodium: 137 mmol/L (ref 135–145)

## 2020-09-10 LAB — I-STAT BETA HCG BLOOD, ED (MC, WL, AP ONLY): I-stat hCG, quantitative: 11.8 m[IU]/mL — ABNORMAL HIGH

## 2020-09-10 MED ORDER — HYDROXYZINE HCL 25 MG PO TABS: 25.0000 mg | ORAL_TABLET | Freq: Four times a day (QID) | ORAL | 0 refills | Status: DC

## 2020-09-10 MED ORDER — METHOCARBAMOL 500 MG PO TABS: 500.0000 mg | ORAL_TABLET | Freq: Two times a day (BID) | ORAL | 0 refills | Status: DC

## 2020-09-10 MED ORDER — NAPROXEN 500 MG PO TABS: 500.0000 mg | ORAL_TABLET | Freq: Two times a day (BID) | ORAL | 0 refills | Status: DC

## 2020-09-10 MED ORDER — IOHEXOL 350 MG/ML SOLN
75.0000 mL | Freq: Once | INTRAVENOUS | Status: AC | PRN
  Administered 2020-09-10: 75 mL via INTRAVENOUS

## 2020-09-10 MED ORDER — LORAZEPAM 1 MG PO TABS
1.0000 mg | ORAL_TABLET | Freq: Once | ORAL | Status: AC
  Administered 2020-09-10: 1 mg via ORAL
  Filled 2020-09-10: qty 1

## 2020-09-10 NOTE — ED Notes (Signed)
Pt returned from CT °

## 2020-09-10 NOTE — Discharge Instructions (Addendum)
As discussed, your images and labs were all unremarkable. I am sending you home with pain medication, a muscle relaxer, and some anxiety medication. Take as needed.  Muscle relaxer can cause drowsiness do not drive or operate machinery on the medication.  Please follow-up with PCP if symptoms not improved within the next week.  Return to the ER for new or worsening symptoms.
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
## 2020-09-22 ENCOUNTER — Ambulatory Visit: Payer: Medicaid Other | Admitting: Obstetrics and Gynecology

## 2020-09-29 ENCOUNTER — Encounter (HOSPITAL_COMMUNITY): Payer: Self-pay | Admitting: Emergency Medicine

## 2020-09-29 ENCOUNTER — Emergency Department (HOSPITAL_COMMUNITY)
Admission: EM | Admit: 2020-09-29 | Discharge: 2020-09-30 | Disposition: A | Discharge status: Home / Self Care | Payer: Medicaid Other | Attending: Emergency Medicine | Admitting: Emergency Medicine

## 2020-09-29 ENCOUNTER — Emergency Department (HOSPITAL_COMMUNITY): Payer: Medicaid Other

## 2020-09-29 ENCOUNTER — Other Ambulatory Visit: Payer: Self-pay

## 2020-09-29 DIAGNOSIS — Z5321 Procedure and treatment not carried out due to patient leaving prior to being seen by health care provider: Secondary | ICD-10-CM | POA: Diagnosis not present

## 2020-09-29 DIAGNOSIS — R002 Palpitations: Secondary | ICD-10-CM | POA: Insufficient documentation

## 2020-09-29 DIAGNOSIS — R42 Dizziness and giddiness: Secondary | ICD-10-CM | POA: Insufficient documentation

## 2020-09-29 DIAGNOSIS — R079 Chest pain, unspecified: Secondary | ICD-10-CM | POA: Diagnosis not present

## 2020-09-29 LAB — CBC
HCT: 37.6 % (ref 36.0–46.0)
Hemoglobin: 12.5 g/dL (ref 12.0–15.0)
MCH: 29.2 pg (ref 26.0–34.0)
MCHC: 33.2 g/dL (ref 30.0–36.0)
MCV: 87.9 fL (ref 80.0–100.0)
Platelets: 254 10*3/uL (ref 150–400)
RBC: 4.28 MIL/uL (ref 3.87–5.11)
RDW: 14.1 % (ref 11.5–15.5)
WBC: 6.5 10*3/uL (ref 4.0–10.5)
nRBC: 0 % (ref 0.0–0.2)

## 2020-09-29 LAB — I-STAT BETA HCG BLOOD, ED (MC, WL, AP ONLY): I-stat hCG, quantitative: <5 m[IU]/mL (ref <5)

## 2020-09-29 NOTE — ED Triage Notes (Signed)
Patient reports intermittent central chest pain with palpitations onset last week , mild dizziness , denies SOB , no emesis or diaphoresis .

## 2020-09-30 LAB — URINALYSIS, MICROSCOPIC (REFLEX): Bacteria, UA: NONE SEEN

## 2020-09-30 LAB — BASIC METABOLIC PANEL
Anion gap: 11 (ref 5–15)
BUN: 10 mg/dL (ref 6–20)
CO2: 24 mmol/L (ref 22–32)
Calcium: 9.4 mg/dL (ref 8.9–10.3)
Chloride: 103 mmol/L (ref 98–111)
Creatinine, Ser: 0.69 mg/dL (ref 0.44–1.00)
GFR, Estimated: >60 mL/min (ref >60)
Glucose, Bld: 94 mg/dL (ref 70–99)
Potassium: 3.6 mmol/L (ref 3.5–5.1)
Sodium: 138 mmol/L (ref 135–145)

## 2020-09-30 LAB — URINALYSIS, ROUTINE W REFLEX MICROSCOPIC
Bilirubin Urine: NEGATIVE
Glucose, UA: NEGATIVE mg/dL
Hgb urine dipstick: NEGATIVE
Ketones, ur: NEGATIVE mg/dL
Nitrite: NEGATIVE
Protein, ur: NEGATIVE mg/dL
Specific Gravity, Urine: 1.015 (ref 1.005–1.030)
pH: 7.5 (ref 5.0–8.0)

## 2020-09-30 LAB — TROPONIN I (HIGH SENSITIVITY)
Troponin I (High Sensitivity): <2 ng/L (ref <18)
Troponin I (High Sensitivity): <2 ng/L (ref <18)

## 2020-09-30 NOTE — ED Notes (Signed)
No answer from pt in waiting room 

## 2020-09-30 NOTE — ED Notes (Signed)
Pt is outside, unable to obtain vitals at this time, will try again later

## 2020-09-30 NOTE — ED Notes (Signed)
Called times 2

## 2020-09-30 NOTE — ED Notes (Signed)
Called pt x4 for room, no response. 

## 2020-10-20 ENCOUNTER — Emergency Department (HOSPITAL_COMMUNITY)
Admission: EM | Admit: 2020-10-20 | Discharge: 2020-10-20 | Disposition: A | Discharge status: Home / Self Care | Payer: Medicaid Other | Attending: Emergency Medicine | Admitting: Emergency Medicine

## 2020-10-20 ENCOUNTER — Encounter (HOSPITAL_COMMUNITY): Payer: Self-pay | Admitting: Emergency Medicine

## 2020-10-20 ENCOUNTER — Emergency Department (HOSPITAL_COMMUNITY): Payer: Medicaid Other

## 2020-10-20 DIAGNOSIS — J45909 Unspecified asthma, uncomplicated: Secondary | ICD-10-CM | POA: Insufficient documentation

## 2020-10-20 DIAGNOSIS — S39012A Strain of muscle, fascia and tendon of lower back, initial encounter: Secondary | ICD-10-CM | POA: Diagnosis not present

## 2020-10-20 DIAGNOSIS — F17210 Nicotine dependence, cigarettes, uncomplicated: Secondary | ICD-10-CM | POA: Insufficient documentation

## 2020-10-20 DIAGNOSIS — S3992XA Unspecified injury of lower back, initial encounter: Secondary | ICD-10-CM | POA: Diagnosis present

## 2020-10-20 DIAGNOSIS — S161XXA Strain of muscle, fascia and tendon at neck level, initial encounter: Secondary | ICD-10-CM | POA: Diagnosis not present

## 2020-10-20 DIAGNOSIS — Y92410 Unspecified street and highway as the place of occurrence of the external cause: Secondary | ICD-10-CM | POA: Diagnosis not present

## 2020-10-20 LAB — POC URINE PREG, ED: Preg Test, Ur: NEGATIVE

## 2020-10-20 MED ORDER — NAPROXEN 375 MG PO TABS: 375.0000 mg | ORAL_TABLET | Freq: Two times a day (BID) | ORAL | 0 refills | Status: DC

## 2020-10-20 MED ORDER — ACETAMINOPHEN 325 MG PO TABS: 650.0000 mg | ORAL_TABLET | Freq: Once | ORAL | Status: DC

## 2020-10-20 MED ORDER — METHOCARBAMOL 500 MG PO TABS: 500.0000 mg | ORAL_TABLET | Freq: Two times a day (BID) | ORAL | 0 refills | Status: DC

## 2020-10-20 NOTE — Discharge Instructions (Addendum)
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
## 2020-10-29 ENCOUNTER — Ambulatory Visit (INDEPENDENT_AMBULATORY_CARE_PROVIDER_SITE_OTHER): Payer: Medicaid Other | Admitting: Pediatrics

## 2020-11-03 ENCOUNTER — Inpatient Hospital Stay (HOSPITAL_COMMUNITY)
Admission: AD | Admit: 2020-11-03 | Discharge: 2020-11-03 | Disposition: A | Discharge status: Home / Self Care | Payer: Medicaid Other | Attending: Obstetrics and Gynecology | Admitting: Obstetrics and Gynecology

## 2020-11-03 ENCOUNTER — Other Ambulatory Visit: Payer: Self-pay

## 2020-11-03 DIAGNOSIS — N939 Abnormal uterine and vaginal bleeding, unspecified: Secondary | ICD-10-CM | POA: Diagnosis present

## 2020-11-03 DIAGNOSIS — Z3202 Encounter for pregnancy test, result negative: Secondary | ICD-10-CM | POA: Diagnosis not present

## 2020-11-03 LAB — HCG, QUANTITATIVE, PREGNANCY: hCG, Beta Chain, Quant, S: 1 m[IU]/mL (ref <5)

## 2020-11-03 LAB — POCT PREGNANCY, URINE: Preg Test, Ur: NEGATIVE

## 2020-11-03 NOTE — MAU Note (Signed)
Pt states she had +HPT last week. Has had brown spotting and cramping since yesterday.

## 2020-11-03 NOTE — MAU Note (Signed)
Melissa [name] is a 20 y.o. here in MAU reporting: had + UPT a week ago. Reports she has been having nausea, cramping, and brown spotting for the past week.  LMP: 09/22/20  Onset of complaint: ongoing  Pain score: 5/10  Vitals:   11/03/20 1448  BP: 111/78  Pulse: (!) 110  Resp: 16  Temp: 98.2 F (36.8 C)  SpO2: 100%     Lab orders placed from triage: UPT

## 2020-11-03 NOTE — MAU Provider Note (Signed)
Patient Melissa [name] is a 20 y.o. G2P1011  with LMP of 09/22/2020. She reports that she is having spotting and abdominal pain yesterday. She had a positive pregnancy test last week. She is here because she is concerned about her pregnancy.  She reports that she is having brown and pink bleeding; she denies clots. She had a miscarriage in December. She has a toddler at home.    Patient Vitals for the past 24 hrs:  BP Temp Temp src Pulse Resp SpO2  11/03/20 1448 111/78 98.2 F (36.8 C) Oral (!) 110 16 100 %    UPT in MAU is negative, will send for stat quant. If pregnant, she will need Rhogam so patient will wait for results.    Patient's quant is 1, indicating that she is not pregnant. Upon questioning patient, she reports that the line on her UPT only turned up positive after several minutes, and was not positive at 3 minutes. Explained to patient that pregnancy tests can only be read at the 3 minute mark and that it would be inaccurate to read it after 5 or 10 minutes.  Explained to patient that she is most likely getting her period; recommended heat, tylenol for her complaints. She will follow up with [facility].  All questions answered.     Charlesetta Garibaldi Kooistra 11/03/2020, 3:02 PM

## 2020-11-05 ENCOUNTER — Encounter (HOSPITAL_COMMUNITY): Payer: Self-pay

## 2020-11-05 ENCOUNTER — Emergency Department (HOSPITAL_COMMUNITY)
Admission: EM | Admit: 2020-11-05 | Discharge: 2020-11-05 | Disposition: A | Discharge status: Home / Self Care | Payer: Medicaid Other | Attending: Emergency Medicine | Admitting: Emergency Medicine

## 2020-11-05 ENCOUNTER — Other Ambulatory Visit: Payer: Self-pay

## 2020-11-05 DIAGNOSIS — R1030 Lower abdominal pain, unspecified: Secondary | ICD-10-CM | POA: Diagnosis present

## 2020-11-05 DIAGNOSIS — M545 Low back pain, unspecified: Secondary | ICD-10-CM | POA: Diagnosis not present

## 2020-11-05 DIAGNOSIS — Z5321 Procedure and treatment not carried out due to patient leaving prior to being seen by health care provider: Secondary | ICD-10-CM | POA: Insufficient documentation

## 2020-11-05 LAB — CBC
HCT: 39.5 % (ref 36.0–46.0)
Hemoglobin: 13.4 g/dL (ref 12.0–15.0)
MCH: 28.9 pg (ref 26.0–34.0)
MCHC: 33.9 g/dL (ref 30.0–36.0)
MCV: 85.3 fL (ref 80.0–100.0)
Platelets: 276 10*3/uL (ref 150–400)
RBC: 4.63 MIL/uL (ref 3.87–5.11)
RDW: 12.5 % (ref 11.5–15.5)
WBC: 7.5 10*3/uL (ref 4.0–10.5)
nRBC: 0 % (ref 0.0–0.2)

## 2020-11-05 LAB — COMPREHENSIVE METABOLIC PANEL
ALT: 9 U/L (ref 0–44)
AST: 14 U/L — ABNORMAL LOW (ref 15–41)
Albumin: 3.8 g/dL (ref 3.5–5.0)
Alkaline Phosphatase: 60 U/L (ref 38–126)
Anion gap: 8 (ref 5–15)
BUN: 9 mg/dL (ref 6–20)
CO2: 25 mmol/L (ref 22–32)
Calcium: 9.5 mg/dL (ref 8.9–10.3)
Chloride: 102 mmol/L (ref 98–111)
Creatinine, Ser: 0.76 mg/dL (ref 0.44–1.00)
GFR, Estimated: >60 mL/min (ref >60)
Glucose, Bld: 93 mg/dL (ref 70–99)
Potassium: 3.5 mmol/L (ref 3.5–5.1)
Sodium: 135 mmol/L (ref 135–145)
Total Bilirubin: 0.9 mg/dL (ref 0.3–1.2)
Total Protein: 7.1 g/dL (ref 6.5–8.1)

## 2020-11-05 LAB — URINALYSIS, ROUTINE W REFLEX MICROSCOPIC
Bilirubin Urine: NEGATIVE
Glucose, UA: NEGATIVE mg/dL
Ketones, ur: NEGATIVE mg/dL
Leukocytes,Ua: NEGATIVE
Nitrite: NEGATIVE
Protein, ur: NEGATIVE mg/dL
Specific Gravity, Urine: 1.018 (ref 1.005–1.030)
pH: 5 (ref 5.0–8.0)

## 2020-11-05 LAB — I-STAT BETA HCG BLOOD, ED (MC, WL, AP ONLY): I-stat hCG, quantitative: <5 m[IU]/mL (ref <5)

## 2020-11-05 LAB — LIPASE, BLOOD: Lipase: 40 U/L (ref 11–51)

## 2020-11-05 NOTE — ED Triage Notes (Signed)
Lower abdominal cramping started today and lower back pain. Not in distress.

## 2020-11-10 ENCOUNTER — Encounter (INDEPENDENT_AMBULATORY_CARE_PROVIDER_SITE_OTHER): Payer: Self-pay | Admitting: Pediatrics

## 2020-11-10 ENCOUNTER — Other Ambulatory Visit: Payer: Self-pay

## 2020-11-10 ENCOUNTER — Ambulatory Visit (INDEPENDENT_AMBULATORY_CARE_PROVIDER_SITE_OTHER): Payer: Medicaid Other | Admitting: Pediatrics

## 2020-11-10 VITALS — BP 122/84 | HR 76 | Ht 67.0 in | Wt 170.2 lb

## 2020-11-10 DIAGNOSIS — G43809 Other migraine, not intractable, without status migrainosus: Secondary | ICD-10-CM | POA: Diagnosis not present

## 2020-11-10 DIAGNOSIS — S39012S Strain of muscle, fascia and tendon of lower back, sequela: Secondary | ICD-10-CM

## 2020-11-10 DIAGNOSIS — S39012A Strain of muscle, fascia and tendon of lower back, initial encounter: Secondary | ICD-10-CM | POA: Diagnosis not present

## 2020-11-10 HISTORY — DX: Strain of muscle, fascia and tendon of lower back, sequela: S39.012S

## 2020-11-10 NOTE — Patient Instructions (Signed)
Thank you for coming today.  I am sorry that you lost your baby in December.  I am glad that your migraines are more frequent than they seem to be and there have been no seizures.  Please come back to see me in 4 months time.  At that time I hope to be able to give you the name of a doctor who will provide long-term care for you after I retire May 20, 2021.
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
## 2020-11-19 ENCOUNTER — Other Ambulatory Visit: Payer: Self-pay

## 2020-11-19 ENCOUNTER — Other Ambulatory Visit (HOSPITAL_COMMUNITY)
Admission: RE | Admit: 2020-11-19 | Discharge: 2020-11-19 | Disposition: A | Discharge status: Home / Self Care | Payer: Medicaid Other | Source: Ambulatory Visit | Attending: Obstetrics & Gynecology | Admitting: Obstetrics & Gynecology

## 2020-11-19 ENCOUNTER — Encounter: Payer: Self-pay | Admitting: Obstetrics & Gynecology

## 2020-11-19 ENCOUNTER — Inpatient Hospital Stay (HOSPITAL_COMMUNITY)
Admission: AD | Admit: 2020-11-19 | Discharge: 2020-11-19 | Disposition: A | Discharge status: Home / Self Care | Payer: Medicaid Other | Attending: Obstetrics & Gynecology | Admitting: Obstetrics & Gynecology

## 2020-11-19 ENCOUNTER — Ambulatory Visit (INDEPENDENT_AMBULATORY_CARE_PROVIDER_SITE_OTHER): Payer: Medicaid Other | Admitting: Obstetrics & Gynecology

## 2020-11-19 VITALS — BP 114/77 | HR 86 | Wt 172.0 lb

## 2020-11-19 DIAGNOSIS — R109 Unspecified abdominal pain: Secondary | ICD-10-CM | POA: Diagnosis present

## 2020-11-19 DIAGNOSIS — N760 Acute vaginitis: Secondary | ICD-10-CM

## 2020-11-19 DIAGNOSIS — N898 Other specified noninflammatory disorders of vagina: Secondary | ICD-10-CM

## 2020-11-19 DIAGNOSIS — R102 Pelvic and perineal pain: Secondary | ICD-10-CM

## 2020-11-19 DIAGNOSIS — Z124 Encounter for screening for malignant neoplasm of cervix: Secondary | ICD-10-CM | POA: Insufficient documentation

## 2020-11-19 DIAGNOSIS — F17210 Nicotine dependence, cigarettes, uncomplicated: Secondary | ICD-10-CM | POA: Insufficient documentation

## 2020-11-19 DIAGNOSIS — Z5189 Encounter for other specified aftercare: Secondary | ICD-10-CM | POA: Diagnosis not present

## 2020-11-19 DIAGNOSIS — O039 Complete or unspecified spontaneous abortion without complication: Secondary | ICD-10-CM

## 2020-11-19 DIAGNOSIS — B9689 Other specified bacterial agents as the cause of diseases classified elsewhere: Secondary | ICD-10-CM

## 2020-11-19 DIAGNOSIS — Z3200 Encounter for pregnancy test, result unknown: Secondary | ICD-10-CM | POA: Diagnosis not present

## 2020-11-19 LAB — HCG, QUANTITATIVE, PREGNANCY: hCG, Beta Chain, Quant, S: 1 m[IU]/mL (ref <5)

## 2020-11-19 NOTE — MAU Note (Signed)
Melissa [name] is a 20 y.o. here in MAU reporting: cramping for the past week. States + UPT today at home. No bleeding. Having white discharge, no odor but reports some itching.   LMP: 11/09/20  Onset of complaint: ongoing  Pain score: 7/10  Vitals:   11/19/20 1342  BP: 123/78  Pulse: 87  Resp: 16  Temp: 98 F (36.7 C)  SpO2: 100%     Lab orders placed from triage: none

## 2020-11-19 NOTE — Patient Instructions (Addendum)
Follow-up visit after miscarriage Please start  taking multivitamins with folic acid supplement, avoid infections, toxic substances, use ovulation tracker apps as needed. Optimization of other health issues recommended.    Pelvic Pain, Female Pelvic pain is pain in your lower abdomen, below your belly button and between your hips. The pain may start suddenly (be acute), keep coming back (be recurring), or last a long time (become chronic). Pelvic pain that lasts longer than 6 months is considered chronic. Pelvic pain may affect your:  Reproductive organs.  Urinary system.  Digestive tract.  Musculoskeletal system. There are many potential causes of pelvic pain. Sometimes, the pain can be a result of digestive or urinary conditions, strained muscles or ligaments, or reproductive conditions. Sometimes the cause of pelvic pain is not known. Follow these instructions at home:  Take over-the-counter and prescription medicines only as told by your health care provider.  Rest as told by your health care provider.  Do not have sex if it hurts.  Keep a journal of your pelvic pain. Write down: ? When the pain started. ? Where the pain is located. ? What seems to make the pain better or worse, such as food or your period (menstrual cycle). ? Any symptoms you have along with the pain.  Keep all follow-up visits as told by your health care provider. This is important.   Contact a health care provider if:  Medicine does not help your pain.  Your pain comes back.  You have new symptoms.  You have abnormal vaginal discharge or bleeding, including bleeding after menopause.  You have a fever or chills.  You are constipated.  You have blood in your urine or stool.  You have foul-smelling urine.  You feel weak or light-headed. Get help right away if:  You have sudden severe pain.  Your pain gets steadily worse.  You have severe pain along with fever, nausea, vomiting, or excessive  sweating.  You lose consciousness. Summary  Pelvic pain is pain in your lower abdomen, below your belly button and between your hips.  There are many potential causes of pelvic pain.  Keep a journal of your pelvic pain. This information is not intended to replace advice given to you by your health care provider. Make sure you discuss any questions you have with your health care provider. Document Revised: 01/23/2018 Document Reviewed: 01/23/2018 Elsevier Patient Education  2021 [employer].

## 2020-11-19 NOTE — MAU Provider Note (Signed)
History     CSN: [id number]  Arrival date and time: 11/19/20 1236   Event Date/Time   First Provider Initiated Contact with Patient 11/19/20 1355      Chief Complaint  Patient presents with  . Abdominal Pain   HPI   Melissa [name] is a 20 y.o. female here with concerns about pregnancy. She reports seeing Dr. Macon Large this morning in the office for an annual with pap. She reports wanted a pregnancy test but it was not done. She went home from her visit and did a pregnancy test and it was positive. Her LMP is March 22. Recent SAB> quant on 11/05/20 was negative.  Some lower abdomdinal cramping that she was seen for in the office today.   OB History    Gravida  2   Para  1   Term  1   Preterm      AB  1   Living  1     SAB      IAB      Ectopic      Multiple  0   Live Births  1           Past Medical History:  Diagnosis Date  . Anemia   . Anxiety   . Asthma    inhaler. last attack June 2019  . Congenital hydronephrosis 2001  . Constipation   . Depression   . Headache    migraines  . PID (pelvic inflammatory disease)   . Rh negative state in antepartum period 02/05/2019  . SAB (spontaneous abortion) 08/25/2020  . Seizures ([name])    last one in 2015 - on meds  . TBI (traumatic brain injury) ([name]) 2006    Past Surgical History:  Procedure Laterality Date  . APPENDECTOMY    . HERNIA REPAIR    . INTESTINAL MALROTATION REPAIR  2001    Family History  Problem Relation Age of Onset  . Depression Mother   . Stroke Mother   . Obesity Mother   . Post-traumatic stress disorder Mother   . Anxiety disorder Mother   . Hypertension Mother   . Diabetes Mother   . Schizophrenia Father   . Kidney disease Father     Social History   Tobacco Use  . Smoking status: Current Every Day Smoker    Types: Cigarettes, Cigars  . Smokeless tobacco: Never Used  . Tobacco comment: black and milds, none since + UPT  Vaping Use  . Vaping Use: Never used   Substance Use Topics  . Alcohol use: Never  . Drug use: Not Currently    Types: Marijuana    Comment: last 2020    Allergies:  Allergies  Allergen Reactions  . Benadryl [Diphenhydramine Hcl] Other (See Comments)    Causes seizures   . Gluten Meal Nausea And Vomiting  . Zithromax [Azithromycin] Anaphylaxis and Swelling  . Lactose Intolerance (Gi) Diarrhea    Medications Prior to Admission  Medication Sig Dispense Refill Last Dose  . acetaminophen (TYLENOL) 500 MG tablet Take 500 mg by mouth every 6 (six) hours as needed.     . benzonatate (TESSALON) 100 MG capsule Take 1 capsule (100 mg total) by mouth every 8 (eight) hours. 21 capsule 0   . cetirizine (ZYRTEC) 10 MG tablet TAKE 1 TABLET BY MOUTH EVERY DAY AS NEEDED FOR ALLERGY 30 tablet 11   . cyclobenzaprine (FLEXERIL) 10 MG tablet Take 1 tablet (10 mg total) by mouth 2 (two) times  daily as needed for muscle spasms. 10 tablet 0   . ferrous sulfate 325 (65 FE) MG tablet Take 325 mg by mouth daily with breakfast.     . hydrocortisone (ANUSOL-HC) 2.5 % rectal cream Place 1 application rectally 2 (two) times daily. 30 g 0   . hydrOXYzine (ATARAX/VISTARIL) 25 MG tablet Take 1 tablet (25 mg total) by mouth every 6 (six) hours. 15 tablet 0   . ibuprofen (ADVIL) 600 MG tablet Take 1 tablet (600 mg total) by mouth every 6 (six) hours as needed. 60 tablet 0   . methocarbamol (ROBAXIN) 500 MG tablet Take 1 tablet (500 mg total) by mouth 2 (two) times daily. 20 tablet 0   . naproxen (NAPROSYN) 375 MG tablet Take 1 tablet (375 mg total) by mouth 2 (two) times daily. 20 tablet 0   . ondansetron (ZOFRAN) 8 MG tablet Take 1 tablet (8 mg total) by mouth every 8 (eight) hours as needed for nausea or vomiting. 20 tablet 0   . promethazine (PHENERGAN) 12.5 MG tablet Take 1 tablet (12.5 mg total) by mouth every 6 (six) hours as needed for nausea or vomiting. 30 tablet 0   . promethazine-dextromethorphan (PROMETHAZINE-DM) 6.25-15 MG/5ML syrup Take 5 mLs  by mouth 4 (four) times daily as needed for cough. 118 mL 0    Results for orders placed or performed during the hospital encounter of 11/19/20 (from the past 48 hour(s))  hCG, quantitative, pregnancy     Status: None   Collection Time: 11/19/20  2:06 PM  Result Value Ref Range   hCG, Beta Chain, Quant, S 1 <5 mIU/mL    Comment:          GEST. AGE      CONC.  (mIU/mL)   <=1 WEEK        5 - 50     2 WEEKS       50 - 500     3 WEEKS       100 - 10,000     4 WEEKS     1,000 - 30,000     5 WEEKS     3,500 - 115,000   6-8 WEEKS     12,000 - 270,000    12 WEEKS     15,000 - 220,000        FEMALE AND NON-PREGNANT FEMALE:     LESS THAN 5 mIU/mL Performed at [facility], [address], [place], [place] [address]     Review of Systems  Gastrointestinal: Positive for abdominal pain.  Genitourinary: Negative for vaginal bleeding and vaginal discharge.   Physical Exam   Blood pressure 123/78, pulse 87, temperature 98 F (36.7 C), temperature source Oral, resp. rate 16, height 5\' 7"  (1.702 m), weight 78.5 kg, last menstrual period 11/09/2020, SpO2 100 %, unknown if currently breastfeeding.  Physical Exam Vitals and nursing note reviewed.  Constitutional:      Appearance: She is well-developed.  HENT:     Head: Normocephalic.  Abdominal:     Tenderness: There is no abdominal tenderness. There is no guarding or rebound.  Neurological:     Mental Status: She is alert and oriented to person, place, and time.  Psychiatric:        Mood and Affect: Mood normal.    MAU Course  Procedures  MDM  Patient was seen in the office today for annual Reports 2 home pregnancy tests, quant today is negative. She was called with the  results.   Assessment and Plan   A:  1. Encounter for pregnancy test, result unknown     P:  Quant today negative Abdominal exam negative F/u with the office.  Duane Lope, NP 11/19/2020 7:20 PM

## 2020-11-19 NOTE — Progress Notes (Signed)
GYNECOLOGY OFFICE VISIT NOTE  History:   Melissa [name] is a 20 y.o. G2P1011 here today for follow up after SAB in 07/2020.  Still having lower abdominal pain, this is present during cycle and also outside cycles.  Pain is more on her right. Worried about past PID diagnosis and possible endometriosis.  Also desires pap smear and recheck for Trichomonas.  Reports having white discharge, wants evaluation for this too. Wants to get pregnant again, no contraception.  She denies any abnormal vaginal bleeding or other concerns.    Past Medical History:  Diagnosis Date  . Anemia   . Anxiety   . Asthma    inhaler. last attack June 2019  . Congenital hydronephrosis 2001  . Constipation   . Depression   . Headache    migraines  . PID (pelvic inflammatory disease)   . Rh negative state in antepartum period 02/05/2019  . SAB (spontaneous abortion) 08/25/2020  . Seizures ([name])    last one in 2015 - on meds  . TBI (traumatic brain injury) ([name]) 2006    Past Surgical History:  Procedure Laterality Date  . APPENDECTOMY    . HERNIA REPAIR    . INTESTINAL MALROTATION REPAIR  2001    The following portions of the patient's history were reviewed and updated as appropriate: allergies, current medications, past family history, past medical history, past social history, past surgical history and problem list.   Health Maintenance:  Thinks she received HPV vaccine series, will ask mother.  Review of Systems:  Pertinent items noted in HPI and remainder of comprehensive ROS otherwise negative.  Physical Exam:  BP 114/77   Pulse 86   Wt 172 lb (78 kg)   LMP 11/09/2020   BMI 26.94 kg/m  CONSTITUTIONAL: Well-developed, well-nourished female in no acute distress.  HEENT:  Normocephalic, atraumatic. External right and left ear normal. No scleral icterus.  NECK: Normal range of motion, supple, no masses noted on observation SKIN: No rash noted. Not diaphoretic. No erythema. No  pallor. MUSCULOSKELETAL: Normal range of motion. No edema noted. NEUROLOGIC: Alert and oriented to person, place, and time. Normal muscle tone coordination. No cranial nerve deficit noted. PSYCHIATRIC: Normal mood and affect. Normal behavior. Normal judgment and thought content. CARDIOVASCULAR: Normal heart rate noted RESPIRATORY: Effort and breath sounds normal, no problems with respiration noted ABDOMEN: No masses noted. No other overt distention noted.   PELVIC: Normal appearing external genitalia; normal urethral meatus; normal appearing vaginal mucosa and cervix.  Thick white discharge noted, testing sample obtained.  Pap smear sample also collected.  Normal uterine size, no other palpable masses, no uterine tenderness. +Right adnexal tenderness.  Performed in the presence of a chaperone     Assessment and Plan:      1. Pelvic pain in female Will follow up labs and ultrasound, manage accordingly. If pain continues, will recommend trial of OCPs.  Recommended NSAIDs for now - Cervicovaginal ancillary only( [place]) - US PELVIC COMPLETE WITH TRANSVAGINAL; Future  2. Vaginal discharge - Cervicovaginal ancillary only( [place])  3. Pap smear for cervical cancer screening - Cytology - PAP( )  4. Follow-up visit after miscarriage - Will start taking multivitamins with folic acid supplement, avoid infections, avoid teratogens, use ovulation tracker apps as needed. Optimization of other health issues recommended.   Routine preventative health maintenance measures emphasized. Please refer to After Visit Summary for other counseling recommendations.   Return for any gynecologic concerns.    I  spent 20 minutes dedicated to the care of this patient including pre-visit review of records, face to face time with the patient discussing her conditions and treatments and post visit ordering of testing.    Jaynie Collins, MD, FACOG Obstetrician & Gynecologist, [facility] for [employer], [facility]

## 2020-11-21 LAB — CERVICOVAGINAL ANCILLARY ONLY
Bacterial Vaginitis (gardnerella): POSITIVE — AB
Candida Glabrata: NEGATIVE
Candida Vaginitis: NEGATIVE
Chlamydia: NEGATIVE
Comment: NEGATIVE
Comment: NEGATIVE
Comment: NEGATIVE
Comment: NEGATIVE
Comment: NEGATIVE
Comment: NORMAL
Neisseria Gonorrhea: NEGATIVE
Trichomonas: NEGATIVE

## 2020-11-22 LAB — CYTOLOGY - PAP: Diagnosis: NEGATIVE

## 2020-11-22 MED ORDER — METRONIDAZOLE 500 MG PO TABS: 500.0000 mg | ORAL_TABLET | Freq: Two times a day (BID) | ORAL | 0 refills | Status: AC

## 2020-11-22 NOTE — Addendum Note (Signed)
Addended by: Jaynie Collins A on: 11/22/2020 12:24 PM   Modules accepted: Orders

## 2020-11-30 ENCOUNTER — Encounter (HOSPITAL_COMMUNITY): Payer: Self-pay

## 2020-11-30 ENCOUNTER — Emergency Department (HOSPITAL_COMMUNITY)
Admission: EM | Admit: 2020-11-30 | Discharge: 2020-11-30 | Disposition: A | Discharge status: Home / Self Care | Payer: Medicaid Other | Attending: Emergency Medicine | Admitting: Emergency Medicine

## 2020-11-30 ENCOUNTER — Ambulatory Visit: Admission: RE | Admit: 2020-11-30 | Payer: Medicaid Other | Source: Ambulatory Visit

## 2020-11-30 DIAGNOSIS — N898 Other specified noninflammatory disorders of vagina: Secondary | ICD-10-CM | POA: Insufficient documentation

## 2020-11-30 DIAGNOSIS — R110 Nausea: Secondary | ICD-10-CM | POA: Diagnosis not present

## 2020-11-30 DIAGNOSIS — F17210 Nicotine dependence, cigarettes, uncomplicated: Secondary | ICD-10-CM | POA: Diagnosis not present

## 2020-11-30 DIAGNOSIS — J45909 Unspecified asthma, uncomplicated: Secondary | ICD-10-CM | POA: Diagnosis not present

## 2020-11-30 DIAGNOSIS — R1030 Lower abdominal pain, unspecified: Secondary | ICD-10-CM | POA: Diagnosis not present

## 2020-11-30 LAB — COMPREHENSIVE METABOLIC PANEL
ALT: 10 U/L (ref 0–44)
AST: 14 U/L — ABNORMAL LOW (ref 15–41)
Albumin: 3.7 g/dL (ref 3.5–5.0)
Alkaline Phosphatase: 59 U/L (ref 38–126)
Anion gap: 6 (ref 5–15)
BUN: 10 mg/dL (ref 6–20)
CO2: 27 mmol/L (ref 22–32)
Calcium: 9.3 mg/dL (ref 8.9–10.3)
Chloride: 106 mmol/L (ref 98–111)
Creatinine, Ser: 0.8 mg/dL (ref 0.44–1.00)
GFR, Estimated: >60 mL/min (ref >60)
Glucose, Bld: 88 mg/dL (ref 70–99)
Potassium: 3.7 mmol/L (ref 3.5–5.1)
Sodium: 139 mmol/L (ref 135–145)
Total Bilirubin: 0.8 mg/dL (ref 0.3–1.2)
Total Protein: 7 g/dL (ref 6.5–8.1)

## 2020-11-30 LAB — URINALYSIS, ROUTINE W REFLEX MICROSCOPIC
Bilirubin Urine: NEGATIVE
Glucose, UA: NEGATIVE mg/dL
Hgb urine dipstick: NEGATIVE
Ketones, ur: NEGATIVE mg/dL
Nitrite: NEGATIVE
Protein, ur: NEGATIVE mg/dL
Specific Gravity, Urine: 1.003 — ABNORMAL LOW (ref 1.005–1.030)
pH: 6 (ref 5.0–8.0)

## 2020-11-30 LAB — I-STAT BETA HCG BLOOD, ED (MC, WL, AP ONLY): I-stat hCG, quantitative: <5 m[IU]/mL (ref <5)

## 2020-11-30 LAB — LIPASE, BLOOD: Lipase: 38 U/L (ref 11–51)

## 2020-11-30 LAB — CBC
HCT: 38.8 % (ref 36.0–46.0)
Hemoglobin: 13 g/dL (ref 12.0–15.0)
MCH: 28.8 pg (ref 26.0–34.0)
MCHC: 33.5 g/dL (ref 30.0–36.0)
MCV: 86 fL (ref 80.0–100.0)
Platelets: 226 10*3/uL (ref 150–400)
RBC: 4.51 MIL/uL (ref 3.87–5.11)
RDW: 12.6 % (ref 11.5–15.5)
WBC: 4.9 10*3/uL (ref 4.0–10.5)
nRBC: 0 % (ref 0.0–0.2)

## 2020-11-30 MED ORDER — ONDANSETRON 4 MG PO TBDP
4.0000 mg | ORAL_TABLET | Freq: Once | ORAL | Status: DC
  Filled 2020-11-30: qty 1

## 2020-11-30 NOTE — ED Triage Notes (Signed)
Pt reports LM 11/09/20. Pt reports abd pain. Pt reports I think I might be pregnant. Pt reports pain x2-3 days.

## 2020-11-30 NOTE — ED Notes (Signed)
Patient appears to have left department at this time. All belongings are gone. Will dispo AMA.

## 2020-11-30 NOTE — ED Triage Notes (Signed)
Emergency Medicine Provider Triage Evaluation Note  Melissa [name] , a 20 y.o. female  was evaluated in triage.  Pt complains of intermittent lower abdominal pain x 1 week associated with nausea and increased vaginal discharge. She is currently sexually active without protection. No urinary symptoms. She notes she "feels something moving in her abdomen" which made her think she could possibly be pregnant. Nausea occurs after meals. She has had a intestinal malrotation repair, but no other abdominal operations.   Review of Systems  Positive: Abdominal pain, nausea, vaginal discharge Negative: fever  Physical Exam  BP 116/69 (BP Location: Right Arm)   Pulse 87   Temp 98.3 F (36.8 C) (Oral)   Resp 18   LMP 11/09/2020   SpO2 99%  Gen:   Awake, no distress   HEENT:  Atraumatic  Resp:  Normal effort  Cardiac:  Normal rate  Abd:   Nondistended, very mild diffuse abdominal tenderness MSK:   Moves extremities without difficulty  Neuro:  Speech clear   Medical Decision Making  Medically screening exam initiated at 2:52 PM.  Appropriate orders placed.  Melissa [name] was informed that the remainder of the evaluation will be completed by another provider, this initial triage assessment does not replace that evaluation, and the importance of remaining in the ED until their evaluation is complete.  Clinical Impression  Abdominal pain with nausea and vaginal discharge. Abdominal labs ordered.    Mannie Stabile, [place] 11/30/20 1456

## 2020-11-30 NOTE — ED Provider Notes (Signed)
[facility] EMERGENCY DEPARTMENT Provider Note   CSN: [id number] Arrival date & time: 11/30/20  1332     History Chief Complaint  Patient presents with  . Abdominal Pain    Melissa [name] is a 20 y.o. female with a past medical history significant for anemia, anxiety, asthma, congenital hydronephrosis, history of PID, seizures, and history of TBI who presents to the ED due to intermittent lower abdominal pain x1 week associated with nausea and increased vaginal discharge.  Patient denies associated urinary symptoms and back pain.  She is currently sexually active with 1 partner without protection.  Patient states she feels something moving in her abdomen which she believed was caused by pregnancy.  Patient notes nausea occurs after meals.  She has had an intestinal malrotation repair however, no other abdominal operations.  Denies hematemesis, melena, and hematochezia.  No fever or chills.  Per patient, she had full STD work-up last week which was negative; however she was diagnosed with Flagyl which she has been compliant with.  She has not been sexually active since. No alcohol while using flagyl. LMP 11/09/20.   History obtained from patient and past medical records. No interpreter used during encounter.      Past Medical History:  Diagnosis Date  . Anemia   . Anxiety   . Asthma    inhaler. last attack June 2019  . Congenital hydronephrosis 2001  . Constipation   . Depression   . Headache    migraines  . PID (pelvic inflammatory disease)   . Rh negative state in antepartum period 02/05/2019  . SAB (spontaneous abortion) 08/25/2020  . Seizures ([name])    last one in 2015 - on meds  . TBI (traumatic brain injury) [facility]) 2006    Patient Active Problem List   Diagnosis Date Noted  . Low back strain, sequela 11/10/2020  . Migraine variant 08/04/2020  . Abnormal involuntary movements 08/04/2020  . First trimester pregnancy 08/04/2020  . History of  seizures 05/07/2020  . Migraine with aura and without status migrainosus, not intractable 08/12/2019  . Seizures ([name])   . Inadequate oral nutritional intake 04/27/2019  . Sickle cell trait ([name]) 02/13/2019  . 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 DMDD (disruptive mood dysregulation disorder) ([name]) 07/21/2016  . 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 Bilateral low back pain without sciatica 03/08/2016  . Acne vulgaris 08/03/2015  . Chronic constipation 08/03/2015  . 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 Migraine without aura and without status migrainosus, not intractable 04/16/2015  . Episodic tension-type headache, not intractable 04/16/2015  . 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 GAD (generalized anxiety disorder) 11/25/2013  . Suicide attempt by drug ingestion ([name]) 11/18/2013    Past Surgical History:  Procedure Laterality Date  . APPENDECTOMY    . HERNIA REPAIR    . INTESTINAL MALROTATION REPAIR  2001     OB History    Gravida  2   Para  1   Term  1   Preterm      AB  1   Living  1     SAB      IAB      Ectopic      Multiple  0   Live Births  1           Family History  Problem Relation Age of Onset  . Depression Mother   .  Stroke Mother   . Obesity Mother   . Post-traumatic stress disorder Mother   . Anxiety disorder Mother   . Hypertension Mother   . Diabetes Mother   . Schizophrenia Father   . Kidney disease Father     Social History   Tobacco Use  . Smoking status: Current Every Day Smoker    Types: Cigarettes, Cigars  . Smokeless tobacco: Never Used  . Tobacco comment: black and milds, none since + UPT  Vaping Use  . Vaping Use: Never used  Substance Use Topics  . Alcohol use: Never  . Drug use: Not Currently    Types: Marijuana    Comment: last 2020    Home Medications Prior to Admission medications   Medication Sig  Start Date End Date Taking? Authorizing Provider  acetaminophen (TYLENOL) 500 MG tablet Take 500 mg by mouth every 6 (six) hours as needed.    [provider]  benzonatate (TESSALON) 100 MG capsule Take 1 capsule (100 mg total) by mouth every 8 (eight) hours. 08/31/20   Rhys MartiniGraham, Laura E, PA-C  cetirizine (ZYRTEC) 10 MG tablet TAKE 1 TABLET BY MOUTH EVERY DAY AS NEEDED FOR ALLERGY 01/23/20   Florestine AversHanvey, [place], MD  cyclobenzaprine (FLEXERIL) 10 MG tablet Take 1 tablet (10 mg total) by mouth 2 (two) times daily as needed for muscle spasms. 08/19/20   Sharyon Cableogers, Veronica C, CNM  ferrous sulfate 325 (65 FE) MG tablet Take 325 mg by mouth daily with breakfast.    [provider]  hydrocortisone (ANUSOL-HC) 2.5 % rectal cream Place 1 application rectally 2 (two) times daily. 08/31/20   Rhys MartiniGraham, Laura E, PA-C  hydrOXYzine (ATARAX/VISTARIL) 25 MG tablet Take 1 tablet (25 mg total) by mouth every 6 (six) hours. 09/10/20   Mannie StabileAberman, Maebel Marasco C, PA-C  methocarbamol (ROBAXIN) 500 MG tablet Take 1 tablet (500 mg total) by mouth 2 (two) times daily. 10/20/20   Arthor CaptainHarris, Abigail, PA-C  promethazine (PHENERGAN) 12.5 MG tablet Take 1 tablet (12.5 mg total) by mouth every 6 (six) hours as needed for nausea or vomiting. 08/17/20   Raelyn Moraawson, Rolitta, CNM  promethazine-dextromethorphan (PROMETHAZINE-DM) 6.25-15 MG/5ML syrup Take 5 mLs by mouth 4 (four) times daily as needed for cough. 08/31/20   Rhys MartiniGraham, Laura E, PA-C  rizatriptan (MAXALT-MLT) 10 MG disintegrating tablet Take 1 tablet at onset of migraine with 2 ibuprofen may repeat an additional tablet in 2 hours if needed 05/07/20 06/09/20  Deetta PerlaHickling, William H, MD    Allergies    Benadryl [diphenhydramine hcl], Gluten meal, Zithromax [azithromycin], and Lactose intolerance (gi)  Review of Systems   Review of Systems  Constitutional: Negative for chills and fever.  Respiratory: Negative for shortness of breath.   Cardiovascular: Negative for chest pain.   Gastrointestinal: Positive for abdominal pain and nausea. Negative for diarrhea and vomiting.  Genitourinary: Positive for vaginal discharge. Negative for dysuria.  All other systems reviewed and are negative.   Physical Exam Updated Vital Signs BP 116/69 (BP Location: Right Arm)   Pulse 87   Temp 98.3 F (36.8 C) (Oral)   Resp 18   LMP 11/09/2020   SpO2 99%   Physical Exam Vitals and nursing note reviewed.  Constitutional:      General: She is not in acute distress.    Appearance: She is not ill-appearing.  HENT:     Head: Normocephalic.  Eyes:     Pupils: Pupils are equal, round, and reactive to light.  Cardiovascular:     Rate and  Rhythm: Normal rate and regular rhythm.     Pulses: Normal pulses.     Heart sounds: Normal heart sounds. No murmur heard. No friction rub. No gallop.   Pulmonary:     Effort: Pulmonary effort is normal.     Breath sounds: Normal breath sounds.  Abdominal:     General: Abdomen is flat. There is no distension.     Palpations: Abdomen is soft.     Tenderness: There is abdominal tenderness. There is no guarding or rebound.     Comments: Very mild diffuse abdominal tenderness.  No focal tenderness.  No rebound or guarding.  Negative CVA tenderness bilaterally.  Musculoskeletal:        General: Normal range of motion.     Cervical back: Neck supple.  Skin:    General: Skin is warm and dry.  Neurological:     General: No focal deficit present.     Mental Status: She is alert.  Psychiatric:        Mood and Affect: Mood normal.        Behavior: Behavior normal.     ED Results / Procedures / Treatments   Labs (all labs ordered are listed, but only abnormal results are displayed) Labs Reviewed  COMPREHENSIVE METABOLIC PANEL - Abnormal; Notable for the following components:      Result Value   AST 14 (*)    All other components within normal limits  URINALYSIS, ROUTINE W REFLEX MICROSCOPIC - Abnormal; Notable for the following  components:   APPearance CLOUDY (*)    Specific Gravity, Urine 1.003 (*)    Leukocytes,Ua SMALL (*)    Bacteria, UA FEW (*)    All other components within normal limits  WET PREP, GENITAL  LIPASE, BLOOD  CBC  I-STAT BETA HCG BLOOD, ED ([facility], [facility], [facility] ONLY)  GC/CHLAMYDIA PROBE AMP ([place]) NOT AT [facility]    EKG None  Radiology No results found.  Procedures Procedures   Medications Ordered in ED Medications  ondansetron (ZOFRAN-ODT) disintegrating tablet 4 mg (4 mg Oral Not Given 11/30/20 1603)    ED Course  I have reviewed the triage vital signs and the nursing notes.  Pertinent labs & imaging results that were available during my care of the patient were reviewed by me and considered in my medical decision making (see chart for details).    MDM Rules/Calculators/A&P                         19 year old female presents to the ED due to intermittent lower abdominal pain associate with nausea and increased vaginal discharge.  Patient has concerns that she is pregnant.  No fever or chills.  Upon arrival, vitals all within normal limits.  Patient no distress and nontoxic-appearing.  Physical exam reassuring.  Benign abdominal exam.  Doubt acute abdomen.  Routine labs ordered at triage. Zofran given for symptomatic relief.  MP reassuring with normal renal function major electrolyte derangements.  Lipase normal.  Pregnancy test negative.  BC unremarkable no leukocytosis and normal hemoglobin.  UA significant for small leukocytes and.  Urine appears to be contaminated likely due to vaginal discharge.  4:06 PM informed by RN that patient has eloped from the ED prior to pelvic exam.  Final Clinical Impression(s) / ED Diagnoses Final diagnoses:  Lower abdominal pain  Nausea    Rx / DC Orders ED Discharge Orders    None       Claudette Stapler  Jeanella Craze 11/30/20 1608    Pollyann Savoy, MD 12/01/20 [phone]

## 2020-12-22 ENCOUNTER — Emergency Department (HOSPITAL_COMMUNITY)
Admission: EM | Admit: 2020-12-22 | Discharge: 2020-12-23 | Disposition: A | Discharge status: Home / Self Care | Payer: Medicaid Other | Attending: Emergency Medicine | Admitting: Emergency Medicine

## 2020-12-22 ENCOUNTER — Other Ambulatory Visit: Payer: Self-pay

## 2020-12-22 ENCOUNTER — Encounter (HOSPITAL_COMMUNITY): Payer: Self-pay

## 2020-12-22 DIAGNOSIS — R42 Dizziness and giddiness: Secondary | ICD-10-CM | POA: Diagnosis not present

## 2020-12-22 DIAGNOSIS — R1110 Vomiting, unspecified: Secondary | ICD-10-CM | POA: Diagnosis not present

## 2020-12-22 DIAGNOSIS — M545 Low back pain, unspecified: Secondary | ICD-10-CM | POA: Diagnosis not present

## 2020-12-22 DIAGNOSIS — R109 Unspecified abdominal pain: Secondary | ICD-10-CM | POA: Diagnosis not present

## 2020-12-22 DIAGNOSIS — Z5321 Procedure and treatment not carried out due to patient leaving prior to being seen by health care provider: Secondary | ICD-10-CM | POA: Diagnosis not present

## 2020-12-22 LAB — COMPREHENSIVE METABOLIC PANEL
ALT: 10 U/L (ref 0–44)
AST: 14 U/L — ABNORMAL LOW (ref 15–41)
Albumin: 3.8 g/dL (ref 3.5–5.0)
Alkaline Phosphatase: 62 U/L (ref 38–126)
Anion gap: 7 (ref 5–15)
BUN: 14 mg/dL (ref 6–20)
CO2: 27 mmol/L (ref 22–32)
Calcium: 9.5 mg/dL (ref 8.9–10.3)
Chloride: 103 mmol/L (ref 98–111)
Creatinine, Ser: 0.85 mg/dL (ref 0.44–1.00)
GFR, Estimated: >60 mL/min (ref >60)
Glucose, Bld: 81 mg/dL (ref 70–99)
Potassium: 3.7 mmol/L (ref 3.5–5.1)
Sodium: 137 mmol/L (ref 135–145)
Total Bilirubin: 0.7 mg/dL (ref 0.3–1.2)
Total Protein: 7.2 g/dL (ref 6.5–8.1)

## 2020-12-22 LAB — CBC
HCT: 39.4 % (ref 36.0–46.0)
Hemoglobin: 13.2 g/dL (ref 12.0–15.0)
MCH: 28.3 pg (ref 26.0–34.0)
MCHC: 33.5 g/dL (ref 30.0–36.0)
MCV: 84.4 fL (ref 80.0–100.0)
Platelets: 225 10*3/uL (ref 150–400)
RBC: 4.67 MIL/uL (ref 3.87–5.11)
RDW: 13.2 % (ref 11.5–15.5)
WBC: 7.2 10*3/uL (ref 4.0–10.5)
nRBC: 0 % (ref 0.0–0.2)

## 2020-12-22 LAB — URINALYSIS, ROUTINE W REFLEX MICROSCOPIC
Bacteria, UA: NONE SEEN
Bilirubin Urine: NEGATIVE
Glucose, UA: NEGATIVE mg/dL
Hgb urine dipstick: NEGATIVE
Ketones, ur: NEGATIVE mg/dL
Nitrite: NEGATIVE
Protein, ur: NEGATIVE mg/dL
Specific Gravity, Urine: 1.02 (ref 1.005–1.030)
pH: 5 (ref 5.0–8.0)

## 2020-12-22 LAB — I-STAT BETA HCG BLOOD, ED (MC, WL, AP ONLY): I-stat hCG, quantitative: <5 m[IU]/mL (ref <5)

## 2020-12-22 LAB — LIPASE, BLOOD: Lipase: 40 U/L (ref 11–51)

## 2020-12-22 NOTE — ED Triage Notes (Signed)
Lower back pain, abdominal cramping and vomiting x 2 weeks.

## 2020-12-23 ENCOUNTER — Encounter (INDEPENDENT_AMBULATORY_CARE_PROVIDER_SITE_OTHER): Payer: Self-pay

## 2020-12-23 NOTE — ED Notes (Signed)
Pt did not respond fir treatment area, not inside or outside lobby

## 2020-12-31 ENCOUNTER — Inpatient Hospital Stay (HOSPITAL_COMMUNITY)
Admission: AD | Admit: 2020-12-31 | Discharge: 2020-12-31 | Disposition: A | Discharge status: Home / Self Care | Payer: Medicaid Other | Attending: Obstetrics and Gynecology | Admitting: Obstetrics and Gynecology

## 2020-12-31 ENCOUNTER — Encounter (HOSPITAL_COMMUNITY): Payer: Self-pay | Admitting: Obstetrics and Gynecology

## 2020-12-31 ENCOUNTER — Inpatient Hospital Stay (HOSPITAL_COMMUNITY): Payer: Medicaid Other

## 2020-12-31 ENCOUNTER — Other Ambulatory Visit: Payer: Self-pay

## 2020-12-31 ENCOUNTER — Telehealth: Payer: Self-pay | Admitting: Advanced Practice Midwife

## 2020-12-31 DIAGNOSIS — O26851 Spotting complicating pregnancy, first trimester: Secondary | ICD-10-CM

## 2020-12-31 DIAGNOSIS — O209 Hemorrhage in early pregnancy, unspecified: Secondary | ICD-10-CM | POA: Diagnosis present

## 2020-12-31 DIAGNOSIS — O3680X0 Pregnancy with inconclusive fetal viability, not applicable or unspecified: Secondary | ICD-10-CM | POA: Diagnosis not present

## 2020-12-31 DIAGNOSIS — Z79899 Other long term (current) drug therapy: Secondary | ICD-10-CM | POA: Insufficient documentation

## 2020-12-31 DIAGNOSIS — N760 Acute vaginitis: Secondary | ICD-10-CM

## 2020-12-31 DIAGNOSIS — O99331 Smoking (tobacco) complicating pregnancy, first trimester: Secondary | ICD-10-CM | POA: Diagnosis not present

## 2020-12-31 DIAGNOSIS — O23591 Infection of other part of genital tract in pregnancy, first trimester: Secondary | ICD-10-CM | POA: Diagnosis not present

## 2020-12-31 DIAGNOSIS — F17210 Nicotine dependence, cigarettes, uncomplicated: Secondary | ICD-10-CM | POA: Insufficient documentation

## 2020-12-31 DIAGNOSIS — R109 Unspecified abdominal pain: Secondary | ICD-10-CM | POA: Insufficient documentation

## 2020-12-31 DIAGNOSIS — B9689 Other specified bacterial agents as the cause of diseases classified elsewhere: Secondary | ICD-10-CM

## 2020-12-31 DIAGNOSIS — Z3A01 Less than 8 weeks gestation of pregnancy: Secondary | ICD-10-CM | POA: Diagnosis not present

## 2020-12-31 DIAGNOSIS — O23599 Infection of other part of genital tract in pregnancy, unspecified trimester: Secondary | ICD-10-CM

## 2020-12-31 DIAGNOSIS — O26891 Other specified pregnancy related conditions, first trimester: Secondary | ICD-10-CM | POA: Diagnosis not present

## 2020-12-31 LAB — URINALYSIS, ROUTINE W REFLEX MICROSCOPIC
Bilirubin Urine: NEGATIVE
Glucose, UA: NEGATIVE mg/dL
Hgb urine dipstick: NEGATIVE
Ketones, ur: NEGATIVE mg/dL
Nitrite: NEGATIVE
Protein, ur: NEGATIVE mg/dL
Specific Gravity, Urine: 1.016 (ref 1.005–1.030)
pH: 6 (ref 5.0–8.0)

## 2020-12-31 LAB — POCT PREGNANCY, URINE: Preg Test, Ur: POSITIVE — AB

## 2020-12-31 LAB — WET PREP, GENITAL
Sperm: NONE SEEN
Trich, Wet Prep: NONE SEEN
Yeast Wet Prep HPF POC: NONE SEEN

## 2020-12-31 LAB — GC/CHLAMYDIA PROBE AMP (~~LOC~~) NOT AT ARMC
Chlamydia: NEGATIVE
Comment: NEGATIVE
Comment: NORMAL
Neisseria Gonorrhea: NEGATIVE

## 2020-12-31 LAB — CBC
HCT: 35.8 % — ABNORMAL LOW (ref 36.0–46.0)
Hemoglobin: 12.2 g/dL (ref 12.0–15.0)
MCH: 28.4 pg (ref 26.0–34.0)
MCHC: 34.1 g/dL (ref 30.0–36.0)
MCV: 83.3 fL (ref 80.0–100.0)
Platelets: 234 10*3/uL (ref 150–400)
RBC: 4.3 MIL/uL (ref 3.87–5.11)
RDW: 13.9 % (ref 11.5–15.5)
WBC: 7 10*3/uL (ref 4.0–10.5)
nRBC: 0 % (ref 0.0–0.2)

## 2020-12-31 LAB — HCG, QUANTITATIVE, PREGNANCY: hCG, Beta Chain, Quant, S: 817 m[IU]/mL — ABNORMAL HIGH (ref <5)

## 2020-12-31 LAB — HIV ANTIBODY (ROUTINE TESTING W REFLEX): HIV Screen 4th Generation wRfx: NONREACTIVE

## 2020-12-31 MED ORDER — RHO D IMMUNE GLOBULIN 1500 UNIT/2ML IJ SOSY
300.0000 ug | PREFILLED_SYRINGE | Freq: Once | INTRAMUSCULAR | Status: AC
  Administered 2020-12-31: 300 ug via INTRAMUSCULAR
  Filled 2020-12-31: qty 2

## 2020-12-31 NOTE — MAU Provider Note (Signed)
Chief Complaint: Abdominal Pain   Event Date/Time   First Provider Initiated Contact with Patient 12/31/20 0520      SUBJECTIVE HPI: Melissa [name] is a 20 y.o. G3P1011 at [redacted]w[redacted]d by LMP who presents to maternity admissions reporting mild cramping abdominal pain and vaginal discharge that is mostly yellow, but occasionally a little pink and light, spotting only. She was diagnosed with BV but has not picked up her medicine due to her work schedule.  There is no iching or odor.  She had a miscarriage last year and is concerned.   Location: lower abdomen Quality: cramping Severity: 2/10 on pain scale Duration: 1 day Timing: intermittent Modifying factors: none Associated signs and symptoms: vaginal discharge/spotting  HPI  Past Medical History:  Diagnosis Date  . Anemia   . Anxiety   . Asthma    inhaler. last attack June 2019  . Congenital hydronephrosis 2001  . Constipation   . Depression   . Headache    migraines  . PID (pelvic inflammatory disease)   . Rh negative state in antepartum period 02/05/2019  . SAB (spontaneous abortion) 08/25/2020  . Seizures ([name])    last one in 2015 - on meds  . TBI (traumatic brain injury) ([name]) 2006   Past Surgical History:  Procedure Laterality Date  . APPENDECTOMY    . HERNIA REPAIR    . INTESTINAL MALROTATION REPAIR  2001   Social History   Socioeconomic History  . Marital status: Single    Spouse name: Not on file  . Number of children: Not on file  . Years of education: Not on file  . Highest education level: Not on file  Occupational History  . Not on file  Tobacco Use  . Smoking status: Current Every Day Smoker    Types: Cigarettes, Cigars  . Smokeless tobacco: Never Used  . Tobacco comment: black and milds, none since + UPT  Vaping Use  . Vaping Use: Never used  Substance and Sexual Activity  . Alcohol use: Never  . Drug use: Not Currently    Types: Marijuana    Comment: last 2020  . Sexual activity: Yes     Birth control/protection: None  Other Topics Concern  . 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   She attended [employer].    She lives with her parents, siblings, and grandfather.    She enjoys writing, singing, dancing, cooking, and shopping.   She attends [employer].   Social Determinants of Health   Financial Resource Strain: Not on file  Food Insecurity: Not on file  Transportation Needs: Not on file  Physical Activity: Not on file  Stress: Not on file  Social Connections: Not on file  Intimate Partner Violence: Not on file   No current facility-administered medications on file prior to encounter.   Current Outpatient Medications on File Prior to Encounter  Medication Sig Dispense Refill  . acetaminophen (TYLENOL) 500 MG tablet Take 500 mg by mouth every 6 (six) hours as needed.    . benzonatate (TESSALON) 100 MG capsule Take 1 capsule (100 mg total) by mouth every 8 (eight) hours. 21 capsule 0  . cetirizine (ZYRTEC) 10 MG tablet TAKE 1 TABLET BY MOUTH EVERY DAY AS NEEDED FOR ALLERGY 30 tablet 11  . cyclobenzaprine (FLEXERIL) 10 MG tablet Take 1 tablet (10 mg total) by mouth 2 (two) times daily as needed for muscle spasms. 10 tablet 0  .  ferrous sulfate 325 (65 FE) MG tablet Take 325 mg by mouth daily with breakfast.    . hydrocortisone (ANUSOL-HC) 2.5 % rectal cream Place 1 application rectally 2 (two) times daily. 30 g 0  . hydrOXYzine (ATARAX/VISTARIL) 25 MG tablet Take 1 tablet (25 mg total) by mouth every 6 (six) hours. 15 tablet 0  . methocarbamol (ROBAXIN) 500 MG tablet Take 1 tablet (500 mg total) by mouth 2 (two) times daily. 20 tablet 0  . promethazine (PHENERGAN) 12.5 MG tablet Take 1 tablet (12.5 mg total) by mouth every 6 (six) hours as needed for nausea or vomiting. 30 tablet 0  . promethazine-dextromethorphan (PROMETHAZINE-DM) 6.25-15 MG/5ML syrup Take 5 mLs by mouth 4 (four) times daily as needed for cough. 118 mL 0   . [DISCONTINUED] rizatriptan (MAXALT-MLT) 10 MG disintegrating tablet Take 1 tablet at onset of migraine with 2 ibuprofen may repeat an additional tablet in 2 hours if needed 10 tablet 5   Allergies  Allergen Reactions  . Benadryl [Diphenhydramine Hcl] Other (See Comments)    Causes seizures   . Gluten Meal Nausea And Vomiting  . Zithromax [Azithromycin] Anaphylaxis and Swelling  . Lactose Intolerance (Gi) Diarrhea    ROS:  Review of Systems  Constitutional: Negative for chills, fatigue and fever.  Respiratory: Negative for shortness of breath.   Cardiovascular: Negative for chest pain.  Gastrointestinal: Positive for abdominal pain.  Genitourinary: Positive for pelvic pain and vaginal discharge. Negative for difficulty urinating, dysuria, flank pain, vaginal bleeding and vaginal pain.  Neurological: Negative for dizziness and headaches.  Psychiatric/Behavioral: Negative.      I have reviewed patient's Past Medical Hx, Surgical Hx, Family Hx, Social Hx, medications and allergies.   Physical Exam   Patient Vitals for the past 24 hrs:  BP Temp Pulse Resp SpO2 Height Weight  12/31/20 0159 -- -- -- -- 100 % -- --  12/31/20 0155 114/79 98 F (36.7 C) 85 16 -- 5\' 7"  (1.702 m) 79.4 kg   Constitutional: Well-developed, well-nourished female in no acute distress.  Cardiovascular: normal rate Respiratory: normal effort GI: Abd soft, non-tender. Pos BS x 4 MS: Extremities nontender, no edema, normal ROM Neurologic: Alert and oriented x 4.  GU: Neg CVAT.  PELVIC EXAM: vaginal cultures collected by blind swab   LAB RESULTS Results for orders placed or performed during the hospital encounter of 12/31/20 (from the past 24 hour(s))  Urinalysis, Routine w reflex microscopic     Status: Abnormal   Collection Time: 12/31/20  2:12 AM  Result Value Ref Range   Color, Urine YELLOW YELLOW   APPearance HAZY (A) CLEAR   Specific Gravity, Urine 1.016 1.005 - 1.030   pH 6.0 5.0 - 8.0    Glucose, UA NEGATIVE NEGATIVE mg/dL   Hgb urine dipstick NEGATIVE NEGATIVE   Bilirubin Urine NEGATIVE NEGATIVE   Ketones, ur NEGATIVE NEGATIVE mg/dL   Protein, ur NEGATIVE NEGATIVE mg/dL   Nitrite NEGATIVE NEGATIVE   Leukocytes,Ua SMALL (A) NEGATIVE   RBC / HPF 0-5 0 - 5 RBC/hpf   WBC, UA 6-10 0 - 5 WBC/hpf   Bacteria, UA RARE (A) NONE SEEN   Squamous Epithelial / LPF 6-10 0 - 5  Pregnancy, urine POC     Status: Abnormal   Collection Time: 12/31/20  2:14 AM  Result Value Ref Range   Preg Test, Ur POSITIVE (A) NEGATIVE  CBC     Status: Abnormal   Collection Time: 12/31/20  4:09 AM  Result Value Ref  Range   WBC 7.0 4.0 - 10.5 K/uL   RBC 4.30 3.87 - 5.11 MIL/uL   Hemoglobin 12.2 12.0 - 15.0 g/dL   HCT 02.735.8 (L) 25.336.0 - 66.446.0 %   MCV 83.3 80.0 - 100.0 fL   MCH 28.4 26.0 - 34.0 pg   MCHC 34.1 30.0 - 36.0 g/dL   RDW 40.313.9 47.411.5 - 25.915.5 %   Platelets 234 150 - 400 K/uL   nRBC 0.0 0.0 - 0.2 %  hCG, quantitative, pregnancy     Status: Abnormal   Collection Time: 12/31/20  4:09 AM  Result Value Ref Range   hCG, Beta Chain, Quant, S 817 (H) <5 mIU/mL  Rh IG workup (includes ABO/Rh)     Status: None (Preliminary result)   Collection Time: 12/31/20  4:09 AM  Result Value Ref Range   Gestational Age(Wks) 4    ABO/RH(D) O NEG    Antibody Screen NEG    Unit Number [id number]    Blood Component Type RHIG    Unit division 00    Status of Unit ISSUED    Transfusion Status      OK TO TRANSFUSE Performed at [facility], [address] [place], [place] [address]   Wet prep, genital     Status: Abnormal   Collection Time: 12/31/20  5:53 AM   Specimen: Vaginal  Result Value Ref Range   Yeast Wet Prep HPF POC NONE SEEN NONE SEEN   Trich, Wet Prep NONE SEEN NONE SEEN   Clue Cells Wet Prep HPF POC PRESENT (A) NONE SEEN   WBC, Wet Prep HPF POC MODERATE (A) NONE SEEN   Sperm NONE SEEN     --/--/O NEG (05/13 0409)  IMAGING US OB LESS THAN 14 WEEKS WITH OB TRANSVAGINAL  Result  Date: 12/31/2020 CLINICAL DATA:  Vaginal bleeding and cramping. Early pregnancy. Clinical gestational age [redacted] weeks 2 days EXAM: OBSTETRIC <14 WK US AND TRANSVAGINAL OB US TECHNIQUE: Both transabdominal and transvaginal ultrasound examinations were performed for complete evaluation of the gestation as well as the maternal uterus, adnexal regions, and pelvic cul-de-sac. Transvaginal technique was performed to assess early pregnancy. COMPARISON:  None. FINDINGS: Intrauterine gestational sac: Single, appears elongated. Yolk sac:  Not Visualized. Embryo:  Not Visualized. MSD: 10.9 mm   5 w   5 d Subchorionic hemorrhage:  None visualized. Maternal uterus/adnexae: Right ovary: 3.4 x 1.8 x 2.9 cm. (Volume = 9.3 cm^3) appears normal. No mass. Left ovary: 3.0 x 1.6 x 2.2 cm. (Volume = 5.5 cm^3) appears normal. No mass. Other :None Free fluid:  None IMPRESSION: 1. Probable early intrauterine gestational sac (which appears elongated), but no yolk sac, fetal pole, or cardiac activity yet visualized. Recommend follow-up quantitative B-HCG levels and follow-up US in 14 days to confirm and assess viability. This recommendation follows SRU consensus guidelines: Diagnostic Criteria for Nonviable Pregnancy Early in the First Trimester. Malva Limes Engl J Med 2013; 166:0630-16; 369:1443-51. Electronically Signed   By: Signa Kellaylor  Stroud M.D.   On: 12/31/2020 07:33    MAU Management/MDM: Orders Placed This Encounter  Procedures  . Wet prep, genital  . US OB LESS THAN 14 WEEKS WITH OB TRANSVAGINAL  . Urinalysis, Routine w reflex microscopic Urine, Clean Catch  . CBC  . hCG, quantitative, pregnancy  . HIV Antibody (routine testing w rflx)  . Pregnancy, urine POC  . Rh IG workup (includes ABO/Rh)  . Discharge patient    Meds ordered this encounter  Medications  . rho (  d) immune globulin (RHIG/RHOPHYLAC) injection 300 mcg    No acute abdomen, no bleeding, but tan/pink discharge only.  Discharge has been more pink/light red than today per pt so  rhophylac dose given today due to Rh negative status.    Findings today could represent a normal early pregnancy, spontaneous abortion or ectopic pregnancy which can be life-threatening.  Ectopic precautions were given to the patient with plan to return in 48 hours for repeat quant hcg to evaluate pregnancy development. Pt to return to MAU on  Sunday, 01/02/21, for repeat hcg or sooner as needed for worsening symptoms.  Pt to complete Rx for Flagyl for BV as prescribed.  ASSESSMENT 1. Pregnancy of unknown anatomic location   2. Vaginal bleeding in pregnancy, first trimester   3. Rh negative state in antepartum period, first trimester   4. Spotting affecting pregnancy in first trimester   5. Abdominal pain during pregnancy in first trimester   6. Bacterial vaginosis     PLAN Discharge home with ectopic precautions  Allergies as of 12/31/2020      Reactions   Benadryl [diphenhydramine Hcl] Other (See Comments)   Causes seizures   Gluten Meal Nausea And Vomiting   Zithromax [azithromycin] Anaphylaxis, Swelling   Lactose Intolerance (gi) Diarrhea      Medication List    STOP taking these medications   methocarbamol 500 MG tablet Commonly known as: ROBAXIN     TAKE these medications   acetaminophen 500 MG tablet Commonly known as: TYLENOL Take 500 mg by mouth every 6 (six) hours as needed.   benzonatate 100 MG capsule Commonly known as: TESSALON Take 1 capsule (100 mg total) by mouth every 8 (eight) hours.   cetirizine 10 MG tablet Commonly known as: ZYRTEC TAKE 1 TABLET BY MOUTH EVERY DAY AS NEEDED FOR ALLERGY   cyclobenzaprine 10 MG tablet Commonly known as: FLEXERIL Take 1 tablet (10 mg total) by mouth 2 (two) times daily as needed for muscle spasms.   ferrous sulfate 325 (65 FE) MG tablet Take 325 mg by mouth daily with breakfast.   hydrocortisone 2.5 % rectal cream Commonly known as: ANUSOL-HC Place 1 application rectally 2 (two) times daily.   hydrOXYzine 25 MG  tablet Commonly known as: ATARAX/VISTARIL Take 1 tablet (25 mg total) by mouth every 6 (six) hours.   promethazine 12.5 MG tablet Commonly known as: PHENERGAN Take 1 tablet (12.5 mg total) by mouth every 6 (six) hours as needed for nausea or vomiting.   promethazine-dextromethorphan 6.25-15 MG/5ML syrup Commonly known as: PROMETHAZINE-DM Take 5 mLs by mouth 4 (four) times daily as needed for cough.       Follow-up Information    [facility] Follow up.   Specialty: Obstetrics and Gynecology Why: Return on Sunday, Jan 02, 2021, for repeat labs or return sooner for emergencies. Contact information: [address] [id number] mc [place] [address] [phone]              Sharen Counter Certified Nurse-Midwife 12/31/2020  7:53 AM

## 2020-12-31 NOTE — Telephone Encounter (Signed)
Pt was not in lobby when OB US resulted.  I called pt and reviewed lab and  US findings with her.  Findings today could represent a normal early pregnancy, spontaneous abortion or ectopic pregnancy which can be life-threatening.  Ectopic precautions were given to the patient with plan to return in 48 hours for repeat quant hcg to evaluate pregnancy development.  Pt to return to MAU on Sunday, 01/02/21, for stat hcg, or sooner with worsening symptoms. Pt states understanding and will return on 01/02/21.

## 2020-12-31 NOTE — MAU Note (Signed)
Took UPT Sunday and was positive. Having lower back pain that is crampy and vag d/c that was light pink and yellow. LMP 12/01/20

## 2020-12-31 NOTE — MAU Note (Signed)
Pt left from waiting before provider had a chance to do final discharge. She told registration she had to go. Registration attempted to call her but was unable to reach her. Provider will call patient with final ultrasound result.

## 2020-12-31 NOTE — MAU Note (Signed)
Pt instructed in obtaining vaginal swabs which she did without difficulty. THen back to lobby and will wait for u/s. Agrees with POC

## 2021-01-01 LAB — RH IG WORKUP (INCLUDES ABO/RH)
ABO/RH(D): O NEG
Antibody Screen: NEGATIVE
Gestational Age(Wks): 4
Unit division: 0

## 2021-01-02 ENCOUNTER — Inpatient Hospital Stay (HOSPITAL_COMMUNITY)
Admission: AD | Admit: 2021-01-02 | Discharge: 2021-01-02 | Disposition: A | Discharge status: Home / Self Care | Payer: Medicaid Other | Attending: Obstetrics and Gynecology | Admitting: Obstetrics and Gynecology

## 2021-01-02 ENCOUNTER — Encounter (HOSPITAL_COMMUNITY): Payer: Self-pay | Admitting: Obstetrics and Gynecology

## 2021-01-02 ENCOUNTER — Other Ambulatory Visit: Payer: Self-pay

## 2021-01-02 DIAGNOSIS — O3680X0 Pregnancy with inconclusive fetal viability, not applicable or unspecified: Secondary | ICD-10-CM | POA: Diagnosis not present

## 2021-01-02 DIAGNOSIS — Z3A01 Less than 8 weeks gestation of pregnancy: Secondary | ICD-10-CM | POA: Insufficient documentation

## 2021-01-02 DIAGNOSIS — M549 Dorsalgia, unspecified: Secondary | ICD-10-CM

## 2021-01-02 LAB — CBC
HCT: 37 % (ref 36.0–46.0)
Hemoglobin: 12.6 g/dL (ref 12.0–15.0)
MCH: 28.5 pg (ref 26.0–34.0)
MCHC: 34.1 g/dL (ref 30.0–36.0)
MCV: 83.7 fL (ref 80.0–100.0)
Platelets: 215 10*3/uL (ref 150–400)
RBC: 4.42 MIL/uL (ref 3.87–5.11)
RDW: 13.8 % (ref 11.5–15.5)
WBC: 4.7 10*3/uL (ref 4.0–10.5)
nRBC: 0 % (ref 0.0–0.2)

## 2021-01-02 LAB — URINALYSIS, ROUTINE W REFLEX MICROSCOPIC
Bilirubin Urine: NEGATIVE
Glucose, UA: NEGATIVE mg/dL
Hgb urine dipstick: NEGATIVE
Ketones, ur: NEGATIVE mg/dL
Nitrite: NEGATIVE
Protein, ur: NEGATIVE mg/dL
Specific Gravity, Urine: 1.006 (ref 1.005–1.030)
pH: 6 (ref 5.0–8.0)

## 2021-01-02 LAB — HCG, QUANTITATIVE, PREGNANCY: hCG, Beta Chain, Quant, S: 2769 m[IU]/mL — ABNORMAL HIGH (ref <5)

## 2021-01-02 MED ORDER — ACETAMINOPHEN 500 MG PO TABS
1000.0000 mg | ORAL_TABLET | Freq: Four times a day (QID) | ORAL | Status: DC | PRN
  Administered 2021-01-02: 1000 mg via ORAL
  Filled 2021-01-02: qty 2

## 2021-01-02 NOTE — Discharge Instructions (Signed)
Back Pain in Pregnancy Back pain during pregnancy is common. Back pain may be caused by several factors that are related to changes during your pregnancy. Follow these instructions at home: Managing pain, stiffness, and swelling  If directed, for sudden (acute) back pain, put ice on the painful area. ? Put ice in a plastic bag. ? Place a towel between your skin and the bag. ? Leave the ice on for 20 minutes, 2-3 times per day.  If directed, apply heat to the affected area before you exercise. Use the heat source that your health care provider recommends, such as a moist heat pack or a heating pad. ? Place a towel between your skin and the heat source. ? Leave the heat on for 20-30 minutes. ? Remove the heat if your skin turns bright red. This is especially important if you are unable to feel pain, heat, or cold. You may have a greater risk of getting burned.  If directed, massage the affected area.      Activity  Exercise as told by your health care provider. Gentle exercise is the best way to prevent or manage back pain.  Listen to your body when lifting. If lifting hurts, ask for help or bend your knees. This uses your leg muscles instead of your back muscles.  Squat down when picking up something from the floor. Do not bend over.  Only use bed rest for short periods as told by your health care provider. Bed rest should only be used for the most severe episodes of back pain. Standing, sitting, and lying down  Do not stand in one place for long periods of time.  Use good posture when sitting. Make sure your head rests over your shoulders and is not hanging forward. Use a pillow on your lower back if necessary.  Try sleeping on your side, preferably the left side, with a pregnancy support pillow or 1-2 regular pillows between your legs. ? If you have back pain after a night's rest, your bed may be too soft. ? A firm mattress may provide more support for your back during  pregnancy. General instructions  Do not wear high heels.  Eat a healthy diet. Try to gain weight within your health care provider's recommendations.  Use a maternity girdle, elastic sling, or back brace as told by your health care provider.  Take over-the-counter and prescription medicines only as told by your health care provider.  Work with a physical therapist or massage therapist to find ways to manage back pain. Acupuncture or massage therapy may be helpful.  Keep all follow-up visits as told by your health care provider. This is important. Contact a health care provider if:  Your back pain interferes with your daily activities.  You have increasing pain in other parts of your body. Get help right away if:  You develop numbness, tingling, weakness, or problems with the use of your arms or legs.  You develop severe back pain that is not controlled with medicine.  You have a change in bowel or bladder control.  You develop shortness of breath, dizziness, or you faint.  You develop nausea, vomiting, or sweating.  You have back pain that is a rhythmic, cramping pain similar to labor pains. Labor pain is usually 1-2 minutes apart, lasts for about 1 minute, and involves a bearing down feeling or pressure in your pelvis.  You have back pain and your water breaks or you have vaginal bleeding.  You have back pain or   numbness that travels down your leg.  Your back pain developed after you fell.  You develop pain on one side of your back.  You see blood in your urine.  You develop skin blisters in the area of your back pain. Summary  Back pain may be caused by several factors that are related to changes during your pregnancy.  Follow instructions as told by your health care provider for managing pain, stiffness, and swelling.  Exercise as told by your health care provider. Gentle exercise is the best way to prevent or manage back pain.  Take over-the-counter and  prescription medicines only as told by your health care provider.  Keep all follow-up visits as told by your health care provider. This is important. This information is not intended to replace advice given to you by your health care provider. Make sure you discuss any questions you have with your health care provider. Document Revised: 11/26/2018 Document Reviewed: 01/23/2018 Elsevier Patient Education  2021 Elsevier Inc.   Abdominal Pain During Pregnancy Belly (abdominal) pain is common during pregnancy. There are many possible causes. Some causes are more serious than others. Sometimes the cause is not known. Always tell your doctor if you have belly pain. Follow these instructions at home:  Do not have sex or put anything in your vagina until your pain goes away completely.  Get plenty of rest until your pain gets better.  Drink enough fluid to keep your pee (urine) pale yellow.  Take over-the-counter and prescription medicines only as told by your doctor.  Keep all follow-up visits.   Contact a doctor if:  You keep having pain after resting.  Your pain gets worse after resting.  You have lower belly pain that: ? Comes and goes at regular times. ? Spreads to your back. ? Feels like menstrual cramps.  You have pain or burning when you pee (urinate). Get help right away if:  You have a fever or chills.  You feel like it is hard to breathe.  You have bleeding from your vagina.  You are leaking fluid or tissue from your vagina.  You vomit for more than 24 hours.  You have watery poop (diarrhea) for more than 24 hours.  Your baby is moving less than usual.  You feel very weak or faint.  You have very bad pain in your upper belly. Summary  Belly pain is common during pregnancy. There are many possible causes.  If you have belly pain during pregnancy, tell your doctor right away.  Keep all follow-up visits. This information is not intended to replace advice  given to you by your health care provider. Make sure you discuss any questions you have with your health care provider. Document Revised: 04/20/2020 Document Reviewed: 04/20/2020 Elsevier Patient Education  2021 [employer].

## 2021-01-02 NOTE — MAU Note (Signed)
Pt reports to mau for follow up lab work.  Reports no changes to her pain or bleeding, but states she is feeling light headed and dizzy.
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
## 2021-01-11 ENCOUNTER — Ambulatory Visit (INDEPENDENT_AMBULATORY_CARE_PROVIDER_SITE_OTHER): Payer: Medicaid Other

## 2021-01-11 ENCOUNTER — Ambulatory Visit
Admission: RE | Admit: 2021-01-11 | Discharge: 2021-01-11 | Disposition: A | Discharge status: Home / Self Care | Payer: Medicaid Other | Source: Ambulatory Visit | Attending: Certified Nurse Midwife | Admitting: Certified Nurse Midwife

## 2021-01-11 ENCOUNTER — Other Ambulatory Visit: Payer: Self-pay

## 2021-01-11 VITALS — BP 110/71 | HR 79

## 2021-01-11 DIAGNOSIS — O3680X0 Pregnancy with inconclusive fetal viability, not applicable or unspecified: Secondary | ICD-10-CM

## 2021-01-11 DIAGNOSIS — O368390 Maternal care for abnormalities of the fetal heart rate or rhythm, unspecified trimester, not applicable or unspecified: Secondary | ICD-10-CM | POA: Diagnosis not present

## 2021-01-11 NOTE — Progress Notes (Signed)
Pt brought down from U/S to receive results.  Pt observed by the front office bent over and pt informing them that she does not feel well.  Called pt to nurse visit room.  Pt informs me that she has not eaten this am because she can not keep anything down.  Pt reports that she feels dizzy, just wants to go home, and get called with results.  BP 110/71.  Pt states that she does have Zofran at home.  I advised pt to take the Zofran around the clock but to mindful that it can cause constipation so please increase fiber intake or starting taking Colace otc.   I advised pt that if that is what she wants is to go home then I can not stop her.  Pt stated that she preferred to go home instead of waiting for results.  I advised pt that I would call her with results.  U/S pic given to pt.    Reviewed results with Dr. Myriam Jacobson who recommends that due to fetal bradycardia that pt has a rpt U/S in no more than week from now.  Pt informed of decreased fetal heart rate and that we need to f/u with it to make sure that it goes up as it should.  Pt informed that her U/S is scheduled for June 1st @ 1345.  Pt advised that if she starts bleeding like a period or starts to have intense pain or unilateral pain to please go to MAU.  Pt verbalized understanding with no further questions.   Leonette Nutting  01/11/21

## 2021-01-11 NOTE — Progress Notes (Signed)
I discussed the patient's case and care plan with Addison Naegeli, RN. I agree with the plan and documentation.   Casper Harrison, MD [facility] Family Medicine Fellow, [facility] for [facility], [facility], [facility]

## 2021-01-19 ENCOUNTER — Ambulatory Visit: Admission: RE | Admit: 2021-01-19 | Payer: Medicaid Other | Source: Ambulatory Visit

## 2021-01-19 ENCOUNTER — Ambulatory Visit: Payer: Medicaid Other

## 2021-01-19 DIAGNOSIS — O0990 Supervision of high risk pregnancy, unspecified, unspecified trimester: Secondary | ICD-10-CM

## 2021-01-19 NOTE — Progress Notes (Signed)
Tried to reach patient x 3, Left VM message to call office. On 3rd attempt I called home phone and left a message with her Mother for her to call and reschedule her appointment.

## 2021-01-31 ENCOUNTER — Emergency Department (HOSPITAL_COMMUNITY)
Admission: EM | Admit: 2021-01-31 | Discharge: 2021-02-01 | Disposition: A | Discharge status: Home / Self Care | Payer: Medicaid Other | Attending: Emergency Medicine | Admitting: Emergency Medicine

## 2021-01-31 ENCOUNTER — Other Ambulatory Visit: Payer: Self-pay

## 2021-01-31 DIAGNOSIS — Z3A08 8 weeks gestation of pregnancy: Secondary | ICD-10-CM | POA: Insufficient documentation

## 2021-01-31 DIAGNOSIS — O99611 Diseases of the digestive system complicating pregnancy, first trimester: Secondary | ICD-10-CM | POA: Insufficient documentation

## 2021-01-31 DIAGNOSIS — Z5321 Procedure and treatment not carried out due to patient leaving prior to being seen by health care provider: Secondary | ICD-10-CM | POA: Diagnosis not present

## 2021-01-31 LAB — URINALYSIS, ROUTINE W REFLEX MICROSCOPIC
Bilirubin Urine: NEGATIVE
Glucose, UA: NEGATIVE mg/dL
Hgb urine dipstick: NEGATIVE
Ketones, ur: NEGATIVE mg/dL
Nitrite: NEGATIVE
Protein, ur: NEGATIVE mg/dL
Specific Gravity, Urine: 1.021 (ref 1.005–1.030)
pH: 5 (ref 5.0–8.0)

## 2021-01-31 LAB — I-STAT BETA HCG BLOOD, ED (MC, WL, AP ONLY): I-stat hCG, quantitative: >2000 m[IU]/mL — ABNORMAL HIGH (ref <5)

## 2021-01-31 LAB — CBC
HCT: 37.1 % (ref 36.0–46.0)
Hemoglobin: 12.7 g/dL (ref 12.0–15.0)
MCH: 28.6 pg (ref 26.0–34.0)
MCHC: 34.2 g/dL (ref 30.0–36.0)
MCV: 83.6 fL (ref 80.0–100.0)
Platelets: 190 10*3/uL (ref 150–400)
RBC: 4.44 MIL/uL (ref 3.87–5.11)
RDW: 14.2 % (ref 11.5–15.5)
WBC: 4.5 10*3/uL (ref 4.0–10.5)
nRBC: 0 % (ref 0.0–0.2)

## 2021-01-31 LAB — COMPREHENSIVE METABOLIC PANEL
ALT: 8 U/L (ref 0–44)
AST: 14 U/L — ABNORMAL LOW (ref 15–41)
Albumin: 3.5 g/dL (ref 3.5–5.0)
Alkaline Phosphatase: 47 U/L (ref 38–126)
Anion gap: 8 (ref 5–15)
BUN: 6 mg/dL (ref 6–20)
CO2: 25 mmol/L (ref 22–32)
Calcium: 9.4 mg/dL (ref 8.9–10.3)
Chloride: 102 mmol/L (ref 98–111)
Creatinine, Ser: 0.65 mg/dL (ref 0.44–1.00)
GFR, Estimated: >60 mL/min (ref >60)
Glucose, Bld: 85 mg/dL (ref 70–99)
Potassium: 3.9 mmol/L (ref 3.5–5.1)
Sodium: 135 mmol/L (ref 135–145)
Total Bilirubin: 0.7 mg/dL (ref 0.3–1.2)
Total Protein: 6.6 g/dL (ref 6.5–8.1)

## 2021-01-31 LAB — LIPASE, BLOOD: Lipase: 28 U/L (ref 11–51)

## 2021-01-31 NOTE — ED Triage Notes (Signed)
Pt reports no bowel movement in two weeks. Unable to tolerate any PO intake without vomiting. Pt passing gas. Trying miralax, prune juice, enema, and laxatives. Sts after the enema she had minimal improvement with some pebble-like stool. Pt [redacted] weeks pregnant, but called the MAU RN who advised her to come to he ED instead of there.

## 2021-01-31 NOTE — ED Notes (Signed)
Called pt x3 for vitals, no answer. Will try again in 15-20 mins.

## 2021-01-31 NOTE — ED Notes (Signed)
Patient states that she needs to get home to her kids. States she will come back if symptoms come back.

## 2021-02-05 ENCOUNTER — Ambulatory Visit (HOSPITAL_COMMUNITY)
Admission: EM | Admit: 2021-02-05 | Discharge: 2021-02-05 | Disposition: A | Discharge status: Home / Self Care | Payer: Medicaid Other | Attending: Family Medicine | Admitting: Family Medicine

## 2021-02-05 ENCOUNTER — Encounter (HOSPITAL_COMMUNITY): Payer: Self-pay

## 2021-02-05 DIAGNOSIS — R109 Unspecified abdominal pain: Secondary | ICD-10-CM

## 2021-02-05 DIAGNOSIS — R112 Nausea with vomiting, unspecified: Secondary | ICD-10-CM

## 2021-02-05 DIAGNOSIS — J029 Acute pharyngitis, unspecified: Secondary | ICD-10-CM | POA: Diagnosis not present

## 2021-02-05 LAB — POCT URINALYSIS DIPSTICK, ED / UC
Bilirubin Urine: NEGATIVE
Glucose, UA: NEGATIVE mg/dL
Hgb urine dipstick: NEGATIVE
Ketones, ur: NEGATIVE mg/dL
Leukocytes,Ua: NEGATIVE
Nitrite: NEGATIVE
Protein, ur: NEGATIVE mg/dL
Specific Gravity, Urine: 1.015 (ref 1.005–1.030)
Urobilinogen, UA: 0.2 mg/dL (ref 0.0–1.0)
pH: 7 (ref 5.0–8.0)

## 2021-02-05 LAB — POCT RAPID STREP A, ED / UC: Streptococcus, Group A Screen (Direct): NEGATIVE

## 2021-02-05 MED ORDER — ONDANSETRON 4 MG PO TBDP: 4.0000 mg | ORAL_TABLET | Freq: Three times a day (TID) | ORAL | 0 refills | Status: DC | PRN

## 2021-02-05 NOTE — Discharge Instructions (Addendum)
You may use over the counter ibuprofen or acetaminophen as needed.  For a sore throat, over the counter products such as [employer] Peroxyl Mouth Sore Rinse or Chloraseptic Sore Throat Spray may provide some temporary relief. Your rapid strep test was negative today. We have sent your throat swab for culture and will let you know of any positive results. 

## 2021-02-05 NOTE — ED Triage Notes (Signed)
Pt in with c/o ST, subjective fever, abdominal pain, back pain, vomiting x 1 week  Pt states her sister just tested positive for strep throat  Pt has not had medication for sx

## 2021-02-07 NOTE — ED Provider Notes (Signed)
[facility]   [id number] 02/05/21 Arrival Time: 1707  ASSESSMENT & PLAN:  1. Sore throat   2. Abdominal discomfort   3. Non-intractable vomiting with nausea, unspecified vomiting type    Benign abdominal exam. No indications for urgent abdominal/pelvic imaging at this time. Discussed. Rapid strep negative. Culture sent. No signs of dehydration requiring IVF.  Meds ordered this encounter  Medications   ondansetron (ZOFRAN-ODT) 4 MG disintegrating tablet    Sig: Take 1 tablet (4 mg total) by mouth every 8 (eight) hours as needed for nausea or vomiting.    Dispense:  15 tablet    Refill:  0     Discharge Instructions      You may use over the counter ibuprofen or acetaminophen as needed.  For a sore throat, over the counter products such as [employer] Peroxyl Mouth Sore Rinse or Chloraseptic Sore Throat Spray may provide some temporary relief. Your rapid strep test was negative today. We have sent your throat swab for culture and will let you know of any positive results.       Reviewed expectations re: course of current medical issues. Questions answered. Outlined signs and symptoms indicating need for more acute intervention. Patient verbalized understanding. After Visit Summary given.   SUBJECTIVE: History from: patient. Melissa [name] is a 20 y.o. female who reports ST, subj fever, abd discomfort, back pain, occas emesis; past week.   OB History     Gravida  3   Para  1   Term  1   Preterm      AB  1   Living  1      SAB      IAB      Ectopic      Multiple  0   Live Births  1          Afebrile. Ambulatory without difficulty.  Patient's last menstrual period was 12/01/2020.  Past Surgical History:  Procedure Laterality Date   APPENDECTOMY     HERNIA REPAIR     INTESTINAL MALROTATION REPAIR  2001     OBJECTIVE:  Vitals:   02/05/21 1728  BP: 108/78  Pulse: (!) 102  Resp: 19  Temp: 99.5 F (37.5 C)   TempSrc: Oral  SpO2: 98%    General appearance: alert, oriented, no acute distress HEENT: [place]; AT; oropharynx moist; throat with mild irritation Neck: supple with FROM Lungs: unlabored respirations Abdomen: soft; without distention; no specific tenderness to palpation; normal bowel sounds; without masses or organomegaly; without guarding or rebound tenderness Back: without reported CVA tenderness; FROM at waist Extremities: without LE edema; symmetrical; without gross deformities Skin: warm and dry Neurologic: normal gait Psychological: alert and cooperative; normal mood and affect  Labs: Results for orders placed or performed during the hospital encounter of 02/05/21  POC Urinalysis dipstick  Result Value Ref Range   Glucose, UA NEGATIVE NEGATIVE mg/dL   Bilirubin Urine NEGATIVE NEGATIVE   Ketones, ur NEGATIVE NEGATIVE mg/dL   Specific Gravity, Urine 1.015 1.005 - 1.030   Hgb urine dipstick NEGATIVE NEGATIVE   pH 7.0 5.0 - 8.0   Protein, ur NEGATIVE NEGATIVE mg/dL   Urobilinogen, UA 0.2 0.0 - 1.0 mg/dL   Nitrite NEGATIVE NEGATIVE   Leukocytes,Ua NEGATIVE NEGATIVE  POCT Rapid Strep A  Result Value Ref Range   Streptococcus, Group A Screen (Direct) NEGATIVE NEGATIVE   Labs Reviewed  POCT URINALYSIS DIPSTICK, ED / UC  POCT RAPID STREP A, ED / UC  Allergies  Allergen Reactions   Benadryl [Diphenhydramine Hcl] Other (See Comments)    Causes seizures    Gluten Meal Nausea And Vomiting   Zithromax [Azithromycin] Anaphylaxis and Swelling   Lactose Intolerance (Gi) Diarrhea                                               Past Medical History:  Diagnosis Date   Anemia    Anxiety    Asthma    inhaler. last attack June 2019   Congenital hydronephrosis 2001   Constipation    Depression    Headache    migraines   PID (pelvic inflammatory disease)    Rh negative state in antepartum period 02/05/2019   SAB (spontaneous abortion) 08/25/2020   Seizures ([name])    last one in  2015 - on meds   TBI (traumatic brain injury) ([name]) 2006    Social History   Socioeconomic History   Marital status: Single    Spouse name: Not on file   Number of children: Not on file   Years of education: Not on file   Highest education level: Not on file  Occupational History   Not on file  Tobacco Use   Smoking status: Every Day    Pack years: 0.00    Types: Cigarettes, Cigars   Smokeless tobacco: Never   Tobacco comments:    black and milds, none since + UPT  Vaping Use   Vaping Use: Never used  Substance and Sexual Activity   Alcohol use: Never   Drug use: Not Currently    Types: Marijuana    Comment: last 2020   Sexual activity: Yes    Birth control/protection: None  Other Topics Concern   Not on file  Social History Narrative   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   She attended [employer].    She lives with her parents, siblings, and grandfather.    She enjoys writing, singing, dancing, cooking, and shopping.   She attends [employer].   Social Determinants of Health   Financial Resource Strain: Not on file  Food Insecurity: Not on file  Transportation Needs: Not on file  Physical Activity: Not on file  Stress: Not on file  Social Connections: Not on file  Intimate Partner Violence: Not on file    Family History  Problem Relation Age of Onset   Depression Mother    Stroke Mother    Obesity Mother    Post-traumatic stress disorder Mother    Anxiety disorder Mother    Hypertension Mother    Diabetes Mother    Schizophrenia Father    Kidney disease Father      Mardella Layman, MD 02/07/21 [phone]

## 2021-02-09 ENCOUNTER — Ambulatory Visit: Payer: Medicaid Other

## 2021-02-09 ENCOUNTER — Telehealth (INDEPENDENT_AMBULATORY_CARE_PROVIDER_SITE_OTHER): Payer: Self-pay | Admitting: Pediatrics

## 2021-02-09 DIAGNOSIS — Z3490 Encounter for supervision of normal pregnancy, unspecified, unspecified trimester: Secondary | ICD-10-CM | POA: Insufficient documentation

## 2021-02-09 DIAGNOSIS — Z3401 Encounter for supervision of normal first pregnancy, first trimester: Secondary | ICD-10-CM

## 2021-02-09 MED ORDER — PREPLUS 27-1 MG PO TABS: 1.0000 | ORAL_TABLET | Freq: Every day | ORAL | 13 refills | Status: DC

## 2021-02-09 MED ORDER — PYRIDOXINE HCL 25 MG PO TABS: 25.0000 mg | ORAL_TABLET | Freq: Four times a day (QID) | ORAL | 3 refills | Status: DC | PRN

## 2021-02-09 NOTE — Progress Notes (Signed)
New OB Intake  I connected with  Melissa [name] on 02/09/21 at  2:00 PM EDT by telephone Video Visit and verified that I am speaking with the correct person using two identifiers. Nurse is located at [facility] and pt is located at home.  I discussed the limitations, risks, security and privacy concerns of performing an evaluation and management service by telephone and the availability of in person appointments. I also discussed with the patient that there may be a patient responsible charge related to this service. The patient expressed understanding and agreed to proceed.  I explained I am completing New OB Intake today. We discussed her EDD of 09/07/2021 that is based on LMP of 12/01/2020. Pt is G3/P1. I reviewed her allergies, medications, Medical/Surgical/OB history, and appropriate screenings. I informed her of [facility] services. Based on history, this is a/an  pregnancy uncomplicated .   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   Bilateral low back pain without sciatica 03/08/2016   Acne vulgaris 08/03/2015   Chronic constipation 08/03/2015   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   MyChart/Babyscripts MyChart access verified. I explained pt will have some visits in office and some virtually. Babyscripts instructions given and order placed. Patient verifies receipt of registration text/e-mail. Account successfully created and app downloaded.  Blood Pressure Cuff  Blood pressure cuff ordered for patient to pick-up from [employer]. Explained after first prenatal appt pt will check weekly and document in Babyscripts.  Weight scale: Patient    have weight scale. Weight scale ordered   Anatomy US Explained first scheduled [place] will be around 19 weeks. Anatomy US scheduled at 14 weeks. MFM.  Labs Discussed Avelina Laine genetic screening with patient. Would like both Panorama and Horizon drawn at new OB visit. Routine prenatal labs needed.  Covid Vaccine Patient has not covid vaccine.   Mother/ Baby Dyad Candidate?    If yes, offer as possibility  Inform patient of [facility] and place . In AVS   Social Determinants of Health Food Insecurity: Patient denies food insecurity. WIC Referral: Patient is interested in referral to [facility].  Transportation: Patient denies transportation needs. Childcare: Discussed no children allowed at ultrasound appointments. Offered childcare services; patient expresses need for childcare services. Childcare scheduled for appropriate appointments and information given to patient.  First visit review I reviewed new OB appt with pt. I explained she will have a pelvic exam, ob bloodwork with genetic screening, and PAP smear. Explained pt will be seen by provider at first visit; encounter routed to appropriate provider. Explained that patient will be seen  by pregnancy navigator following visit with provider. [facility] information placed in AVS.   Geoffrey Mankin A Cheree Ditto,  CMA 02/09/2021  2:30 PM

## 2021-02-09 NOTE — Telephone Encounter (Signed)
History of focal epilepsy with impairment of consciousness.  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  We had no contact since I saw her in March.  I will await contact before I make a referral to an adult neurologist.  I would likely send her to [facility].

## 2021-02-24 ENCOUNTER — Other Ambulatory Visit: Payer: Self-pay

## 2021-02-24 ENCOUNTER — Encounter: Payer: Self-pay | Admitting: Obstetrics & Gynecology

## 2021-02-24 ENCOUNTER — Other Ambulatory Visit (HOSPITAL_COMMUNITY)
Admission: RE | Admit: 2021-02-24 | Discharge: 2021-02-24 | Disposition: A | Discharge status: Home / Self Care | Payer: Medicaid Other | Source: Ambulatory Visit | Attending: Obstetrics & Gynecology | Admitting: Obstetrics & Gynecology

## 2021-02-24 ENCOUNTER — Ambulatory Visit (INDEPENDENT_AMBULATORY_CARE_PROVIDER_SITE_OTHER): Payer: Medicaid Other | Admitting: Obstetrics & Gynecology

## 2021-02-24 VITALS — BP 121/76 | HR 77 | Wt 171.0 lb

## 2021-02-24 DIAGNOSIS — Z3480 Encounter for supervision of other normal pregnancy, unspecified trimester: Secondary | ICD-10-CM

## 2021-02-24 DIAGNOSIS — S0990XS Unspecified injury of head, sequela: Secondary | ICD-10-CM

## 2021-02-24 DIAGNOSIS — Z87898 Personal history of other specified conditions: Secondary | ICD-10-CM

## 2021-02-24 DIAGNOSIS — Z3A12 12 weeks gestation of pregnancy: Secondary | ICD-10-CM

## 2021-02-24 DIAGNOSIS — Z3490 Encounter for supervision of normal pregnancy, unspecified, unspecified trimester: Secondary | ICD-10-CM

## 2021-02-24 DIAGNOSIS — O26891 Other specified pregnancy related conditions, first trimester: Secondary | ICD-10-CM

## 2021-02-24 DIAGNOSIS — R4189 Other symptoms and signs involving cognitive functions and awareness: Secondary | ICD-10-CM

## 2021-02-24 DIAGNOSIS — F70 Mild intellectual disabilities: Secondary | ICD-10-CM

## 2021-02-24 LAB — HEPATITIS C ANTIBODY: HCV Ab: NEGATIVE

## 2021-02-24 NOTE — Progress Notes (Signed)
Subjective:Melissa [name]    Melissa [name] is a [id number] [redacted]w[redacted]d being seen today for her first obstetrical visit.  Her obstetrical history is significant for  depression history . Patient does intend to breast feed. Pregnancy history fully reviewed.  Patient reports nausea.  Vitals:   02/24/21 1022  BP: 121/76  Pulse: 77  Weight: 171 lb (77.6 kg)    HISTORY: OB History  Gravida Para Term Preterm AB Living  3 1 1   1 1   SAB IAB Ectopic Multiple Live Births        0 1    # Outcome Date GA Lbr Len/2nd Weight Sex Delivery Anes PTL Lv  3 Current           2 AB 08/17/20 [redacted]w[redacted]d    SAB     1 Term 08/20/19 [redacted]w[redacted]d 19:04 / 00:10 7 lb 2.6 oz (3.249 kg) F Vag-Spont EPI  LIV   Past Medical History:  Diagnosis Date   Anemia    Anxiety    Asthma    inhaler. last attack June 2019   Congenital hydronephrosis 2001   Constipation    Depression    Headache    migraines   PID (pelvic inflammatory disease)    Rh negative state in antepartum period 02/05/2019   SAB (spontaneous abortion) 08/25/2020   Seizures ([name])    last one in 2015 - on meds   TBI (traumatic brain injury) ([name]) 2006   Past Surgical History:  Procedure Laterality Date   APPENDECTOMY     HERNIA REPAIR     INTESTINAL MALROTATION REPAIR  2001   Family History  Problem Relation Age of Onset   Depression Mother    Stroke Mother    Obesity Mother    Post-traumatic stress disorder Mother    Anxiety disorder Mother    Hypertension Mother    Diabetes Mother    Schizophrenia Father    Kidney disease Father      Exam    Uterus:  Fundal Height: 12 cm  Pelvic Exam:    Perineum: No Hemorrhoids, Normal Perineum   Vulva: normal   Vagina:  No mass   pH:    Cervix: no cervical motion tenderness   Adnexa: normal adnexa   Bony Pelvis: average  System: Breast:  normal appearance, no masses or tenderness, at exam 11/19/20   Skin: normal coloration and turgor, no rashes    Neurologic: oriented, normal mood   Extremities:  normal strength, tone, and muscle mass   HEENT PERRLA and thyroid without masses   Mouth/Teeth mucous membranes moist, pharynx normal without lesions and dental hygiene good   Neck supple   Cardiovascular: regular rate and rhythm, no murmurs or gallops   Respiratory:  appears well, vitals normal, no respiratory distress, acyanotic, normal RR, neck free of mass or lymphadenopathy, chest clear, no wheezing, crepitations, rhonchi, normal symmetric air entry   Abdomen: soft, non-tender; bowel sounds normal; no masses,  no organomegaly   Urinary: urethral meatus normal      Assessment:    Pregnancy: G3P1011 Patient Active Problem List   Diagnosis Date Noted   Supervision of low-risk pregnancy 02/09/2021   Low back strain, sequela 11/10/2020   Migraine variant 08/04/2020   Abnormal involuntary movements 08/04/2020   First trimester pregnancy 08/04/2020   History of seizures 05/07/2020   Migraine with aura and without status migrainosus, not intractable 08/12/2019   Seizures ([name])    Inadequate oral nutritional intake 04/27/2019   Sickle  cell trait ([name]) 02/13/2019   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   DMDD (disruptive mood dysregulation disorder) ([name]) 07/21/2016   MDD (major depressive disorder), recurrent severe, 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   Bilateral low back pain without sciatica 03/08/2016   Acne vulgaris 08/03/2015   Chronic constipation 08/03/2015   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   Migraine without aura and without status migrainosus, not intractable 04/16/2015   Episodic tension-type headache, not intractable 04/16/2015   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 Prenatal vitamins. Problem list reviewed and updated. Genetic Screening discussed Panoramaordered.  Ultrasound  discussed; fetal survey: ordered.  Follow up in 4 weeks. 50% of 30 min visit spent on counseling and coordination of care.  Integrative behavioral health referral made   Scheryl Darter 02/24/2021

## 2021-02-24 NOTE — Progress Notes (Signed)
NOB [redacted]w[redacted]d PHQ9 = 12 will need referral to [facility]. U/S on 01/11/21. Per pt both her and FOB have Sickle cell trait  NOB Intake completed 02/09/21 Did not keep U/S appt on 01/19/21.  Genetic Screening: Desires and wants to know gender.   Last pap: 11/19/2020.

## 2021-02-24 NOTE — Patient Instructions (Signed)
Obstetrics: Normal and Problem Pregnancies (7th ed., pp. 102-121). [place], [place]: Elsevier."> Textbook of Family Medicine (9th ed., pp. 365-410). [place], [place]: [employer].">  First Trimester of Pregnancy  The first trimester of pregnancy starts on the first day of your last menstrual period until the end of week 12. This is months 1 through 3 of pregnancy. A week after a sperm fertilizes an egg, the egg will implant into the wall of the uterus and begin to develop into a baby. By the end of 12 weeks, all the baby'sorgans will be formed and the baby will be 2-3 inches in size. Body changes during your first trimester Your body goes through many changes during pregnancy. The changes vary andgenerally return to normal after your baby is born. Physical changes You may gain or lose weight. Your breasts may begin to grow larger and become tender. The tissue that surrounds your nipples (areola) may become darker. Dark spots or blotches (chloasma or mask of pregnancy) may develop on your face. You may have changes in your hair. These can include thickening or thinning of your hair or changes in texture. Health changes You may feel nauseous, and you may vomit. You may have heartburn. You may develop headaches. You may develop constipation. Your gums may bleed and may be sensitive to brushing and flossing. Other changes You may tire easily. You may urinate more often. Your menstrual periods will stop. You may have a loss of appetite. You may develop cravings for certain kinds of food. You may have changes in your emotions from day to day. You may have more vivid and strange dreams. Follow these instructions at home: Medicines Follow your health care provider's instructions regarding medicine use. Specific medicines may be either safe or unsafe to take during pregnancy. Do not take any medicines unless told to by your health care provider. Take a prenatal vitamin that contains at least  600 micrograms (mcg) of folic acid. Eating and drinking Eat a healthy diet that includes fresh fruits and vegetables, whole grains, good sources of protein such as meat, eggs, or tofu, and low-fat dairy products. Avoid raw meat and unpasteurized juice, milk, and cheese. These carry germs that can harm you and your baby. If you feel nauseous or you vomit: Eat 4 or 5 small meals a day instead of 3 large meals. Try eating a few soda crackers. Drink liquids between meals instead of during meals. You may need to take these actions to prevent or treat constipation: Drink enough fluid to keep your urine pale yellow. Eat foods that are high in fiber, such as beans, whole grains, and fresh fruits and vegetables. Limit foods that are high in fat and processed sugars, such as fried or sweet foods. Activity Exercise only as directed by your health care provider. Most people can continue their usual exercise routine during pregnancy. Try to exercise for 30 minutes at least 5 days a week. Stop exercising if you develop pain or cramping in the lower abdomen or lower back. Avoid exercising if it is very hot or humid or if you are at high altitude. Avoid heavy lifting. If you choose to, you may have sex unless your health care provider tells you not to. Relieving pain and discomfort Wear a good support bra to relieve breast tenderness. Rest with your legs elevated if you have leg cramps or low back pain. If you develop bulging veins (varicose veins) in your legs: Wear support hose as told by your health care provider. Elevate   your feet for 15 minutes, 3-4 times a day. Limit salt in your diet. Safety Wear your seat belt at all times when driving or riding in a car. Talk with your health care provider if someone is verbally or physically abusive to you. Talk with your health care provider if you are feeling sad or have thoughts of hurting yourself. Lifestyle Do not use hot tubs, steam rooms, or  saunas. Do not douche. Do not use tampons or scented sanitary pads. Do not use herbal remedies, alcohol, illegal drugs, or medicines that are not approved by your health care provider. Chemicals in these products can harm your baby. Do not use any products that contain nicotine or tobacco, such as cigarettes, e-cigarettes, and chewing tobacco. If you need help quitting, ask your health care provider. Avoid cat litter boxes and soil used by cats. These carry germs that can cause birth defects in the baby and possibly loss of the unborn baby (fetus) by miscarriage or stillbirth. General instructions During routine prenatal visits in the first trimester, your health care provider will do a physical exam, perform necessary tests, and ask you how things are going. Keep all follow-up visits. This is important. Ask for help if you have counseling or nutritional needs during pregnancy. Your health care provider can offer advice or refer you to specialists for help with various needs. Schedule a dentist appointment. At home, brush your teeth with a soft toothbrush. Floss gently. Write down your questions. Take them to your prenatal visits. Where to find more information American Pregnancy Association: americanpregnancy.org American College of Obstetricians and Gynecologists: acog.org/en/Womens%20Health/Pregnancy Office on Women's Health: womenshealth.gov/pregnancy Contact a health care provider if you have: Dizziness. A fever. Mild pelvic cramps, pelvic pressure, or nagging pain in the abdominal area. Nausea, vomiting, or diarrhea that lasts for 24 hours or longer. A bad-smelling vaginal discharge. Pain when you urinate. Known exposure to a contagious illness, such as chickenpox, measles, Zika virus, HIV, or hepatitis. Get help right away if you have: Spotting or bleeding from your vagina. Severe abdominal cramping or pain. Shortness of breath or chest pain. Any kind of trauma, such as from a fall  or a car crash. New or increased pain, swelling, or redness in an arm or leg. Summary The first trimester of pregnancy starts on the first day of your last menstrual period until the end of week 12 (months 1 through 3). Eating 4 or 5 small meals a day rather than 3 large meals may help to relieve nausea and vomiting. Do not use any products that contain nicotine or tobacco, such as cigarettes, e-cigarettes, and chewing tobacco. If you need help quitting, ask your health care provider. Keep all follow-up visits. This is important. This information is not intended to replace advice given to you by your health care provider. Make sure you discuss any questions you have with your healthcare provider. Document Revised: 01/14/2020 Document Reviewed: 11/20/2019 Elsevier Patient Education  2022 Elsevier Inc.  

## 2021-02-25 LAB — CERVICOVAGINAL ANCILLARY ONLY
Chlamydia: NEGATIVE
Comment: NEGATIVE
Comment: NEGATIVE
Comment: NORMAL
Neisseria Gonorrhea: NEGATIVE
Trichomonas: NEGATIVE

## 2021-02-26 LAB — URINE CULTURE, OB REFLEX

## 2021-02-26 LAB — CULTURE, OB URINE

## 2021-03-01 ENCOUNTER — Ambulatory Visit (INDEPENDENT_AMBULATORY_CARE_PROVIDER_SITE_OTHER): Payer: Medicaid Other | Admitting: Licensed Clinical Social Worker

## 2021-03-01 DIAGNOSIS — O99340 Other mental disorders complicating pregnancy, unspecified trimester: Secondary | ICD-10-CM

## 2021-03-01 DIAGNOSIS — Z3A00 Weeks of gestation of pregnancy not specified: Secondary | ICD-10-CM

## 2021-03-01 DIAGNOSIS — F32A Depression, unspecified: Secondary | ICD-10-CM

## 2021-03-01 LAB — OBSTETRIC PANEL, INCLUDING HIV
Basophils Absolute: 0 10*3/uL (ref 0.0–0.2)
Basos: 0 %
EOS (ABSOLUTE): 0.1 10*3/uL (ref 0.0–0.4)
Eos: 1 %
HIV Screen 4th Generation wRfx: NONREACTIVE
Hematocrit: 37 % (ref 34.0–46.6)
Hemoglobin: 12.6 g/dL (ref 11.1–15.9)
Hepatitis B Surface Ag: NEGATIVE
Immature Grans (Abs): 0 10*3/uL (ref 0.0–0.1)
Immature Granulocytes: 0 %
Lymphocytes Absolute: 1.6 10*3/uL (ref 0.7–3.1)
Lymphs: 33 %
MCH: 28.9 pg (ref 26.6–33.0)
MCHC: 34.1 g/dL (ref 31.5–35.7)
MCV: 85 fL (ref 79–97)
Monocytes Absolute: 0.4 10*3/uL (ref 0.1–0.9)
Monocytes: 9 %
Neutrophils Absolute: 2.8 10*3/uL (ref 1.4–7.0)
Neutrophils: 57 %
Platelets: 198 10*3/uL (ref 150–450)
RBC: 4.36 x10E6/uL (ref 3.77–5.28)
RDW: 15.1 % (ref 11.7–15.4)
RPR Ser Ql: NONREACTIVE
Rh Factor: NEGATIVE
Rubella Antibodies, IGG: 6.97 index (ref >0.99)
WBC: 4.9 10*3/uL (ref 3.4–10.8)

## 2021-03-01 LAB — AB SCR+ANTIBODY ID: Antibody Screen: POSITIVE — AB

## 2021-03-01 LAB — HEPATITIS C ANTIBODY: Hep C Virus Ab: <0.1 s/co ratio (ref 0.0–0.9)

## 2021-03-03 ENCOUNTER — Encounter: Payer: Self-pay | Admitting: Obstetrics & Gynecology

## 2021-03-04 NOTE — BH Specialist Note (Signed)
Integrated Behavioral Health via Telemedicine Visit  03/04/2021 Melissa [name] [id number]  Number of Integrated Behavioral Health visits: 1 Session Start time: 2:00pm  Session End time: 2:24pm Total time: 24 mins via mychart video  Referring Provider: Dr. Debroah Loop  Patient/Family location: Home  [facility] Provider location: [facility]  All persons participating in visit: Pt Melissa [name] and LCSWA A. Felton Clinton  Types of Service: General Behavioral Integrated Care (BHI)  I connected with Melissa [name] and/or Melissa [name]'s n/a via  Telephone or Video Enabled Telemedicine Application  (Video is Caregility application) and verified that I am speaking with the correct person using two identifiers. Discussed confidentiality: Yes   I discussed the limitations of telemedicine and the availability of in person appointments.  Discussed there is a possibility of technology failure and discussed alternative modes of communication if that failure occurs.  I discussed that engaging in this telemedicine visit, they consent to the provision of behavioral healthcare and the services will be billed under their insurance.  Patient and/or legal guardian expressed understanding and consented to Telemedicine visit: Yes   Presenting Concerns: Patient and/or family reports the following symptoms/concerns: depressed mood  Duration of problem: approx 4 weeks ; Severity of problem: mild  Patient and/or Family's Strengths/Protective Factors: Concrete supports in place (healthy food, safe environments, etc.)  Goals Addressed: Patient will:  Reduce symptoms of: depression   Increase knowledge and/or ability of: coping skills   Demonstrate ability to: Increase healthy adjustment to current life circumstances  Progress towards Goals: Ongoing  Interventions: Interventions utilized:  Supportive Counseling Standardized Assessments completed: PHQ 9  Patient and/or Family Response: Melissa [name]  resides in [place] with parents, grandfather and one year daughter. Ms Earlene [name] has a history of depression, anxiety and (brain injury according epic note 08/22/2019). Melissa [name] reports depressed mood and increased anxiety. Melissa [name] reports family is supportive.   Assessment: Patient currently experiencing depression affecting pregnancy .   Patient may benefit from integrated behavioral health.  Plan: Follow up with behavioral health clinician on : 3 weeks  Behavioral recommendations: Engage in stress reducing activities such as swimming, walking, minfulness and journal writing, prioritize rest and take prescribed medication and prenatal vitamins as directed.  Referral(s): Integrated [facility] (In Clinic)  I discussed the assessment and treatment plan with the patient and/or parent/guardian. They were provided an opportunity to ask questions and all were answered. They agreed with the plan and demonstrated an understanding of the instructions.   They were advised to call back or seek an in-person evaluation if the symptoms worsen or if the condition fails to improve as anticipated.  Gwyndolyn Saxon, LCSW

## 2021-03-24 ENCOUNTER — Ambulatory Visit (INDEPENDENT_AMBULATORY_CARE_PROVIDER_SITE_OTHER): Payer: Medicaid Other | Admitting: Obstetrics and Gynecology

## 2021-03-24 ENCOUNTER — Other Ambulatory Visit: Payer: Self-pay

## 2021-03-24 VITALS — BP 96/62 | HR 96 | Wt 179.0 lb

## 2021-03-24 DIAGNOSIS — G40209 Localization-related (focal) (partial) symptomatic epilepsy and epileptic syndromes with complex partial seizures, not intractable, without status epilepticus: Secondary | ICD-10-CM

## 2021-03-24 DIAGNOSIS — Z3A16 16 weeks gestation of pregnancy: Secondary | ICD-10-CM | POA: Insufficient documentation

## 2021-03-24 DIAGNOSIS — R569 Unspecified convulsions: Secondary | ICD-10-CM

## 2021-03-24 DIAGNOSIS — Z3490 Encounter for supervision of normal pregnancy, unspecified, unspecified trimester: Secondary | ICD-10-CM

## 2021-03-24 NOTE — Progress Notes (Signed)
   PRENATAL VISIT NOTE  Subjective:  Melissa [name] is a 20 y.o. G3P1011 at [redacted]w[redacted]d being seen today for ongoing prenatal care.  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  Patient doing well with no acute concerns today. She reports  occasional feelings of lightheadedness .  Contractions: Not present. Vag. Bleeding: None.  Movement: Present. Denies leaking of fluid.   The following portions of the patient's history were reviewed and updated as appropriate: allergies, current medications, past family history, past medical history, past social history, past surgical history and problem list. Problem list updated.  Objective:   Vitals:   03/24/21 1518  BP: 96/62  Pulse: 96  Weight: 179 lb (81.2 kg)    Fetal Status: Fetal Heart Rate (bpm): 142   Movement: Present     General:  Alert, oriented and cooperative. Patient is in no acute distress.  Skin: Skin is warm and dry. No rash noted.   Cardiovascular: Normal heart rate noted  Respiratory: Normal respiratory effort, no problems with respiration noted  Abdomen: Soft, gravid, appropriate for gestational age.  Pain/Pressure: Present     Pelvic: Cervical exam deferred         Extremities: Normal range of motion.  Edema: Trace  Mental Status:  Normal mood and affect. Normal behavior. Normal judgment and thought content.   Assessment and Plan:  Pregnancy: G3P1011 at [redacted]w[redacted]d  1. Encounter for supervision of low-risk pregnancy, antepartum Continue routine prenatal care - AFP, Serum, Open Spina Bifida  2. [redacted] weeks gestation of pregnancy   3. Partial epilepsy with impairment of consciousness ([name])   4. Seizures ([name]) Pt notes no seizures since 2015.  She has been on no seizure meds recently and states she last saw a neurologist 6-8 months ago  Preterm labor symptoms and general obstetric precautions including but not limited to vaginal bleeding, contractions, leaking of fluid and fetal movement were reviewed in detail with the patient.  Please refer to After Visit Summary for other counseling recommendations.   Return in about 4 weeks (around 04/21/2021) for [facility], in person.   Mariel Aloe, MD Faculty Attending Center for [facility]

## 2021-03-28 ENCOUNTER — Inpatient Hospital Stay (HOSPITAL_COMMUNITY)
Admission: AD | Admit: 2021-03-28 | Discharge: 2021-03-28 | Discharge status: Against Medical Advice | Payer: Medicaid Other | Attending: Family Medicine | Admitting: Family Medicine

## 2021-03-28 ENCOUNTER — Other Ambulatory Visit: Payer: Self-pay

## 2021-03-28 ENCOUNTER — Inpatient Hospital Stay (EMERGENCY_DEPARTMENT_HOSPITAL)
Admission: AD | Admit: 2021-03-28 | Discharge: 2021-03-28 | Disposition: A | Discharge status: Home / Self Care | Payer: Medicaid Other | Source: Home / Self Care | Attending: Family Medicine | Admitting: Family Medicine

## 2021-03-28 ENCOUNTER — Encounter (HOSPITAL_COMMUNITY): Payer: Self-pay | Admitting: Family Medicine

## 2021-03-28 ENCOUNTER — Encounter (HOSPITAL_COMMUNITY): Payer: Self-pay | Admitting: Obstetrics & Gynecology

## 2021-03-28 DIAGNOSIS — O26892 Other specified pregnancy related conditions, second trimester: Secondary | ICD-10-CM | POA: Insufficient documentation

## 2021-03-28 DIAGNOSIS — R42 Dizziness and giddiness: Secondary | ICD-10-CM | POA: Insufficient documentation

## 2021-03-28 DIAGNOSIS — Z5321 Procedure and treatment not carried out due to patient leaving prior to being seen by health care provider: Secondary | ICD-10-CM | POA: Insufficient documentation

## 2021-03-28 DIAGNOSIS — Z3A16 16 weeks gestation of pregnancy: Secondary | ICD-10-CM | POA: Diagnosis not present

## 2021-03-28 LAB — COMPREHENSIVE METABOLIC PANEL
ALT: 9 U/L (ref 0–44)
AST: 13 U/L — ABNORMAL LOW (ref 15–41)
Albumin: 2.8 g/dL — ABNORMAL LOW (ref 3.5–5.0)
Alkaline Phosphatase: 54 U/L (ref 38–126)
Anion gap: 8 (ref 5–15)
BUN: 5 mg/dL — ABNORMAL LOW (ref 6–20)
CO2: 24 mmol/L (ref 22–32)
Calcium: 8.9 mg/dL (ref 8.9–10.3)
Chloride: 103 mmol/L (ref 98–111)
Creatinine, Ser: 0.63 mg/dL (ref 0.44–1.00)
GFR, Estimated: >60 mL/min (ref >60)
Glucose, Bld: 85 mg/dL (ref 70–99)
Potassium: 3.9 mmol/L (ref 3.5–5.1)
Sodium: 135 mmol/L (ref 135–145)
Total Bilirubin: 0.6 mg/dL (ref 0.3–1.2)
Total Protein: 6 g/dL — ABNORMAL LOW (ref 6.5–8.1)

## 2021-03-28 LAB — CBC
HCT: 33 % — ABNORMAL LOW (ref 36.0–46.0)
Hemoglobin: 11.3 g/dL — ABNORMAL LOW (ref 12.0–15.0)
MCH: 29.6 pg (ref 26.0–34.0)
MCHC: 34.2 g/dL (ref 30.0–36.0)
MCV: 86.4 fL (ref 80.0–100.0)
Platelets: 183 10*3/uL (ref 150–400)
RBC: 3.82 MIL/uL — ABNORMAL LOW (ref 3.87–5.11)
RDW: 14.5 % (ref 11.5–15.5)
WBC: 6.9 10*3/uL (ref 4.0–10.5)
nRBC: 0 % (ref 0.0–0.2)

## 2021-03-28 LAB — URINALYSIS, ROUTINE W REFLEX MICROSCOPIC
Bilirubin Urine: NEGATIVE
Glucose, UA: NEGATIVE mg/dL
Hgb urine dipstick: NEGATIVE
Ketones, ur: NEGATIVE mg/dL
Nitrite: NEGATIVE
Protein, ur: NEGATIVE mg/dL
Specific Gravity, Urine: 1.012 (ref 1.005–1.030)
pH: 7 (ref 5.0–8.0)

## 2021-03-28 NOTE — MAU Note (Signed)
PT SAYS SHE JUST LEFT AT 7PM- SHE HAS ANXIETY. WENT OUTSIDE - SHE SAYS ALMOST PASSED OUT - HER PHONE SHOWED HER IRON IS DANGEROUSLY LOW.SHE LEFT BEFORE GETTING RESULTS- BC THEY WERE ARGUING

## 2021-03-28 NOTE — MAU Provider Note (Signed)
History     CSN: [id number]  Arrival date and time: 03/28/21 1357   Event Date/Time   First Provider Initiated Contact with Patient 03/28/21 1605       Chief Complaint  Patient presents with   Abdominal Pain   Decreased Fetal Movement   HPI This is a G3 P1-0-1-1 at 16 weeks and 5 days who presents with intermittent episodes of dizziness that happen most commonly in the morning when she first wakes up.  They also happen periodically throughout the day where she will see some black spots and feel like she is going to pass out.  These happen most commonly when she has not eaten for a while or if she gets hot.  She states that appetite is improving.  She drinks about 4 L of water a day.  No other palliating or provoking factors.  Her symptoms have not changed.  Symptoms last for 5-10 minutes and then resolved.  OB History     Gravida  3   Para  1   Term  1   Preterm      AB  1   Living  1      SAB      IAB      Ectopic      Multiple  0   Live Births  1           Past Medical History:  Diagnosis Date   Anemia    Anxiety    Asthma    inhaler. last attack June 2019   Congenital hydronephrosis 2001   Constipation    Depression    Headache    migraines   PID (pelvic inflammatory disease)    Rh negative state in antepartum period 02/05/2019   SAB (spontaneous abortion) 08/25/2020   Seizures ([name])    last one in 2015 - on meds   TBI (traumatic brain injury) ([name]) 2006    Past Surgical History:  Procedure Laterality Date   APPENDECTOMY     HERNIA REPAIR     INTESTINAL MALROTATION REPAIR  2001    Family History  Problem Relation Age of Onset   Depression Mother    Stroke Mother    Obesity Mother    Post-traumatic stress disorder Mother    Anxiety disorder Mother    Hypertension Mother    Diabetes Mother    Schizophrenia Father    Kidney disease Father     Social History   Tobacco Use   Smoking status: Every Day    Types: Cigarettes, Cigars    Smokeless tobacco: Never   Tobacco comments:    black and milds, none since + UPT  Vaping Use   Vaping Use: Never used  Substance Use Topics   Alcohol use: Never   Drug use: Not Currently    Types: Marijuana    Comment: last 2020    Allergies:  Allergies  Allergen Reactions   Benadryl [Diphenhydramine Hcl] Other (See Comments)    Causes seizures    Gluten Meal Nausea And Vomiting   Zithromax [Azithromycin] Anaphylaxis and Swelling   Lactose Intolerance (Gi) Diarrhea    Medications Prior to Admission  Medication Sig Dispense Refill Last Dose   acetaminophen (TYLENOL) 500 MG tablet Take 500 mg by mouth every 6 (six) hours as needed.   Past Week   ferrous sulfate 325 (65 FE) MG tablet Take 325 mg by mouth daily with breakfast.   Past Week   Prenatal Vit-Fe Fumarate-FA (PREPLUS)  27-1 MG TABS Take 1 tablet by mouth daily. 30 tablet 13 03/27/2021   cetirizine (ZYRTEC) 10 MG tablet TAKE 1 TABLET BY MOUTH EVERY DAY AS NEEDED FOR ALLERGY (Patient not taking: No sig reported) 30 tablet 11 Unknown   cyclobenzaprine (FLEXERIL) 10 MG tablet Take 1 tablet (10 mg total) by mouth 2 (two) times daily as needed for muscle spasms. (Patient not taking: No sig reported) 10 tablet 0 Unknown   hydrOXYzine (ATARAX/VISTARIL) 25 MG tablet Take 1 tablet (25 mg total) by mouth every 6 (six) hours. (Patient not taking: No sig reported) 15 tablet 0 Unknown   ondansetron (ZOFRAN-ODT) 4 MG disintegrating tablet Take 1 tablet (4 mg total) by mouth every 8 (eight) hours as needed for nausea or vomiting. 15 tablet 0 Unknown   promethazine (PHENERGAN) 12.5 MG tablet Take 1 tablet (12.5 mg total) by mouth every 6 (six) hours as needed for nausea or vomiting. (Patient not taking: No sig reported) 30 tablet 0 Unknown   pyridOXINE (VITAMIN B-6) 25 MG tablet Take 1 tablet (25 mg total) by mouth 4 (four) times daily as needed (nausea and vomiting). (Patient not taking: No sig reported) 30 tablet 3 Unknown    Review of  Systems  Constitutional:  Negative for chills and fever.  Respiratory:  Negative for shortness of breath, wheezing and stridor.   Cardiovascular:  Negative for chest pain.  All other systems reviewed and are negative. Physical Exam   Blood pressure 110/71, pulse (!) 111, temperature 98.4 F (36.9 C), resp. rate 18, height 5\' 7"  (1.702 m), weight 82.1 kg, last menstrual period 12/01/2020, SpO2 99 %, unknown if currently breastfeeding.  Physical Exam Vitals and nursing note reviewed.  Constitutional:      Appearance: She is well-developed.  Cardiovascular:     Rate and Rhythm: Normal rate and regular rhythm.  Pulmonary:     Effort: Pulmonary effort is normal. No respiratory distress.     Breath sounds: No wheezing.  Abdominal:     General: Abdomen is flat.     Palpations: Abdomen is soft.     Tenderness: There is no abdominal tenderness. There is no right CVA tenderness, left CVA tenderness or guarding.  Skin:    General: Skin is warm and dry.     Capillary Refill: Capillary refill takes less than 2 seconds.  Neurological:     General: No focal deficit present.     Mental Status: She is alert.   Results for orders placed or performed during the hospital encounter of 03/28/21 (from the past 24 hour(s))  Urinalysis, Routine w reflex microscopic Urine, Clean Catch     Status: Abnormal   Collection Time: 03/28/21  2:37 PM  Result Value Ref Range   Color, Urine YELLOW YELLOW   APPearance HAZY (A) CLEAR   Specific Gravity, Urine 1.012 1.005 - 1.030   pH 7.0 5.0 - 8.0   Glucose, UA NEGATIVE NEGATIVE mg/dL   Hgb urine dipstick NEGATIVE NEGATIVE   Bilirubin Urine NEGATIVE NEGATIVE   Ketones, ur NEGATIVE NEGATIVE mg/dL   Protein, ur NEGATIVE NEGATIVE mg/dL   Nitrite NEGATIVE NEGATIVE   Leukocytes,Ua LARGE (A) NEGATIVE   RBC / HPF 0-5 0 - 5 RBC/hpf   WBC, UA 6-10 0 - 5 WBC/hpf   Bacteria, UA FEW (A) NONE SEEN   Squamous Epithelial / LPF 11-20 0 - 5   Mucus PRESENT   Comprehensive  metabolic panel     Status: Abnormal   Collection Time: 03/28/21  4:43 PM  Result Value Ref Range   Sodium 135 135 - 145 mmol/L   Potassium 3.9 3.5 - 5.1 mmol/L   Chloride 103 98 - 111 mmol/L   CO2 24 22 - 32 mmol/L   Glucose, Bld 85 70 - 99 mg/dL   BUN 5 (L) 6 - 20 mg/dL   Creatinine, Ser 1.61 0.44 - 1.00 mg/dL   Calcium 8.9 8.9 - 09.6 mg/dL   Total Protein 6.0 (L) 6.5 - 8.1 g/dL   Albumin 2.8 (L) 3.5 - 5.0 g/dL   AST 13 (L) 15 - 41 U/L   ALT 9 0 - 44 U/L   Alkaline Phosphatase 54 38 - 126 U/L   Total Bilirubin 0.6 0.3 - 1.2 mg/dL   GFR, Estimated >04 >54 mL/min   Anion gap 8 5 - 15  CBC     Status: Abnormal   Collection Time: 03/28/21  4:43 PM  Result Value Ref Range   WBC 6.9 4.0 - 10.5 K/uL   RBC 3.82 (L) 3.87 - 5.11 MIL/uL   Hemoglobin 11.3 (L) 12.0 - 15.0 g/dL   HCT 09.8 (L) 11.9 - 14.7 %   MCV 86.4 80.0 - 100.0 fL   MCH 29.6 26.0 - 34.0 pg   MCHC 34.2 30.0 - 36.0 g/dL   RDW 82.9 56.2 - 13.0 %   Platelets 183 150 - 400 K/uL   nRBC 0.0 0.0 - 0.2 %     MAU Course  Procedures  MDM Check CBC, CMP.  Went to patient's room to reevaluate and discuss labs - patient not present.  Assessment and Plan   1. [redacted] weeks gestation of pregnancy   2. Dizziness    Patient left.   Levie Heritage 03/28/2021, 4:05 PM

## 2021-03-28 NOTE — MAU Provider Note (Signed)
Event Date/Time   First Provider Initiated Contact with Patient 03/28/21 2043      S [name] is a 20 y.o. [phone] patient who presents to MAU today with complaint of dizziness and fatigue. She was seen by me earlier today. She left because she was arguing with her boyfriend. She got an alert on her phone that said her hemoglobin was "dangerously low". She did have some black spots in her vision when she left.   O BP 106/72 (BP Location: Right Arm)   Pulse (!) 105   Temp 98.4 F (36.9 C) (Oral)   Resp 20   Ht 5\' 3"  (1.6 m)   Wt 73.6 kg   LMP 12/01/2020   BMI 28.75 kg/m  Physical Exam Vitals reviewed.  Constitutional:      Appearance: Normal appearance.  Cardiovascular:     Rate and Rhythm: Normal rate and regular rhythm.  Pulmonary:     Effort: Pulmonary effort is normal.     Breath sounds: Normal breath sounds.  Abdominal:     General: Abdomen is flat.     Palpations: Abdomen is soft.  Skin:    General: Skin is warm and dry.     Capillary Refill: Capillary refill takes less than 2 seconds.  Neurological:     General: No focal deficit present.     Mental Status: She is alert.  Psychiatric:        Mood and Affect: Mood normal.        Behavior: Behavior normal.        Thought Content: Thought content normal.   Results for orders placed or performed during the hospital encounter of 03/28/21 (from the past 24 hour(s))  Urinalysis, Routine w reflex microscopic Urine, Clean Catch     Status: Abnormal   Collection Time: 03/28/21  2:37 PM  Result Value Ref Range   Color, Urine YELLOW YELLOW   APPearance HAZY (A) CLEAR   Specific Gravity, Urine 1.012 1.005 - 1.030   pH 7.0 5.0 - 8.0   Glucose, UA NEGATIVE NEGATIVE mg/dL   Hgb urine dipstick NEGATIVE NEGATIVE   Bilirubin Urine NEGATIVE NEGATIVE   Ketones, ur NEGATIVE NEGATIVE mg/dL   Protein, ur NEGATIVE NEGATIVE mg/dL   Nitrite NEGATIVE NEGATIVE   Leukocytes,Ua LARGE (A) NEGATIVE   RBC / HPF 0-5 0 -  5 RBC/hpf   WBC, UA 6-10 0 - 5 WBC/hpf   Bacteria, UA FEW (A) NONE SEEN   Squamous Epithelial / LPF 11-20 0 - 5   Mucus PRESENT   Comprehensive metabolic panel     Status: Abnormal   Collection Time: 03/28/21  4:43 PM  Result Value Ref Range   Sodium 135 135 - 145 mmol/L   Potassium 3.9 3.5 - 5.1 mmol/L   Chloride 103 98 - 111 mmol/L   CO2 24 22 - 32 mmol/L   Glucose, Bld 85 70 - 99 mg/dL   BUN 5 (L) 6 - 20 mg/dL   Creatinine, Ser 05/28/21 0.44 - 1.00 mg/dL   Calcium 8.9 8.9 - 0.10 mg/dL   Total Protein 6.0 (L) 6.5 - 8.1 g/dL   Albumin 2.8 (L) 3.5 - 5.0 g/dL   AST 13 (L) 15 - 41 U/L   ALT 9 0 - 44 U/L   Alkaline Phosphatase 54 38 - 126 U/L   Total Bilirubin 0.6 0.3 - 1.2 mg/dL   GFR, Estimated 93.2 >35 mL/min   Anion gap 8 5 - 15  CBC  Status: Abnormal   Collection Time: 03/28/21  4:43 PM  Result Value Ref Range   WBC 6.9 4.0 - 10.5 K/uL   RBC 3.82 (L) 3.87 - 5.11 MIL/uL   Hemoglobin 11.3 (L) 12.0 - 15.0 g/dL   HCT 66.4 (L) 40.3 - 47.4 %   MCV 86.4 80.0 - 100.0 fL   MCH 29.6 26.0 - 34.0 pg   MCHC 34.2 30.0 - 36.0 g/dL   RDW 25.9 56.3 - 87.5 %   Platelets 183 150 - 400 K/uL   nRBC 0.0 0.0 - 0.2 %   EKG independently reviewed: Normal sinus rhythm. No ST changes or arrhythmia.  A Medical screening exam complete 1. [redacted] weeks gestation of pregnancy   2. Dizziness      P Discharge from MAU in stable condition It appears that her symptoms are related to an increased amount of time between eating.  Discussed eating frequent meals in order to help prevent hypoglycemia. Warning signs for worsening condition that would warrant emergency follow-up discussed Patient may return to MAU as needed   Levie Heritage, DO 03/28/2021 8:43 PM

## 2021-03-28 NOTE — Progress Notes (Signed)
Went in room about 1805 and patient was gone. No conversation with her regarding needing to leave before discharge.

## 2021-03-28 NOTE — MAU Note (Signed)
Pt reports she usually feels baby move everyday but has not felt it move for 2 days. C/O abd cramping on and off for 3 days. Denies any vag bleeding or discharge. C/O of feeling light headed and dizzy for 2 days.

## 2021-04-01 LAB — AFP, SERUM, OPEN SPINA BIFIDA
AFP MoM: 1.33
AFP Value: 46.1 ng/mL
Gest. Age on Collection Date: 16.1 weeks
Maternal Age At EDD: 22.8 yr
OSBR Risk 1 IN: 8798
Test Results:: NEGATIVE
Weight: 179 [lb_av]

## 2021-04-13 ENCOUNTER — Ambulatory Visit: Payer: Medicaid Other | Attending: Obstetrics & Gynecology

## 2021-04-13 ENCOUNTER — Encounter: Payer: Self-pay | Admitting: *Deleted

## 2021-04-13 ENCOUNTER — Other Ambulatory Visit: Payer: Self-pay

## 2021-04-13 ENCOUNTER — Ambulatory Visit: Payer: Medicaid Other | Admitting: *Deleted

## 2021-04-13 VITALS — BP 110/63 | HR 89

## 2021-04-13 DIAGNOSIS — G40909 Epilepsy, unspecified, not intractable, without status epilepticus: Secondary | ICD-10-CM | POA: Diagnosis present

## 2021-04-13 DIAGNOSIS — E669 Obesity, unspecified: Secondary | ICD-10-CM

## 2021-04-13 DIAGNOSIS — O283 Abnormal ultrasonic finding on antenatal screening of mother: Secondary | ICD-10-CM | POA: Diagnosis not present

## 2021-04-13 DIAGNOSIS — Z862 Personal history of diseases of the blood and blood-forming organs and certain disorders involving the immune mechanism: Secondary | ICD-10-CM

## 2021-04-13 DIAGNOSIS — Z87898 Personal history of other specified conditions: Secondary | ICD-10-CM | POA: Insufficient documentation

## 2021-04-13 DIAGNOSIS — Z3A19 19 weeks gestation of pregnancy: Secondary | ICD-10-CM

## 2021-04-13 DIAGNOSIS — Z363 Encounter for antenatal screening for malformations: Secondary | ICD-10-CM | POA: Diagnosis not present

## 2021-04-13 DIAGNOSIS — O99212 Obesity complicating pregnancy, second trimester: Secondary | ICD-10-CM

## 2021-04-13 DIAGNOSIS — Z3490 Encounter for supervision of normal pregnancy, unspecified, unspecified trimester: Secondary | ICD-10-CM

## 2021-04-13 DIAGNOSIS — O99352 Diseases of the nervous system complicating pregnancy, second trimester: Secondary | ICD-10-CM | POA: Insufficient documentation

## 2021-04-14 ENCOUNTER — Other Ambulatory Visit: Payer: Self-pay | Admitting: *Deleted

## 2021-04-14 DIAGNOSIS — Z362 Encounter for other antenatal screening follow-up: Secondary | ICD-10-CM

## 2021-04-21 ENCOUNTER — Encounter: Payer: Medicaid Other | Admitting: Obstetrics and Gynecology

## 2021-04-26 ENCOUNTER — Ambulatory Visit: Payer: Medicaid Other

## 2021-04-26 ENCOUNTER — Encounter: Payer: Medicaid Other | Admitting: Obstetrics & Gynecology

## 2021-05-02 ENCOUNTER — Other Ambulatory Visit: Payer: Self-pay

## 2021-05-02 ENCOUNTER — Ambulatory Visit (INDEPENDENT_AMBULATORY_CARE_PROVIDER_SITE_OTHER): Payer: Medicaid Other | Admitting: Advanced Practice Midwife

## 2021-05-02 ENCOUNTER — Encounter: Payer: Self-pay | Admitting: Obstetrics

## 2021-05-02 VITALS — BP 108/73 | HR 92 | Wt 193.0 lb

## 2021-05-02 DIAGNOSIS — O99340 Other mental disorders complicating pregnancy, unspecified trimester: Secondary | ICD-10-CM

## 2021-05-02 DIAGNOSIS — N949 Unspecified condition associated with female genital organs and menstrual cycle: Secondary | ICD-10-CM

## 2021-05-02 DIAGNOSIS — Z3A21 21 weeks gestation of pregnancy: Secondary | ICD-10-CM

## 2021-05-02 DIAGNOSIS — Z87898 Personal history of other specified conditions: Secondary | ICD-10-CM

## 2021-05-02 DIAGNOSIS — Z3490 Encounter for supervision of normal pregnancy, unspecified, unspecified trimester: Secondary | ICD-10-CM

## 2021-05-02 DIAGNOSIS — F32A Depression, unspecified: Secondary | ICD-10-CM

## 2021-05-02 MED ORDER — CYCLOBENZAPRINE HCL 10 MG PO TABS: 10.0000 mg | ORAL_TABLET | Freq: Three times a day (TID) | ORAL | 0 refills | Status: DC | PRN

## 2021-05-02 NOTE — Progress Notes (Addendum)
   PRENATAL VISIT NOTE  Subjective:  Melissa [name] is a 20 y.o. G3P1011 at [redacted]w[redacted]d being seen today for ongoing prenatal care.  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  Patient reports no complaints.  Contractions: Irritability. Vag. Bleeding: None.  Movement: Present. Denies leaking of fluid.   The following portions of the patient's history were reviewed and updated as appropriate: allergies, current medications, past family history, past medical history, past social history, past surgical history and problem list.   Objective:   Vitals:   05/02/21 1330  BP: 108/73  Pulse: 92  Weight: 193 lb (87.5 kg)    Fetal Status: Fetal Heart Rate (bpm): 145   Movement: Present     General:  Alert, oriented and cooperative. Patient is in no acute distress.  Skin: Skin is warm and dry. No rash noted.   Cardiovascular: Normal heart rate noted  Respiratory: Normal respiratory effort, no problems with respiration noted  Abdomen: Soft, gravid, appropriate for gestational age.  Pain/Pressure: Absent     Pelvic: Cervical exam deferred        Extremities: Normal range of motion.     Mental Status: Normal mood and affect. Normal  behavior. Normal judgment and thought content.   Assessment and Plan:  Pregnancy: G3P1011 at [redacted]w[redacted]d 1. [redacted] weeks gestation of pregnancy   2. Encounter for supervision of low-risk pregnancy, antepartum --Anticipatory guidance about next visits/weeks of pregnancy given. --next visit in 4 weeks  3. History of seizures   4. Depression affecting pregnancy --Doing well, discussed integrated BH, pt to consider  5. Round ligament pain --Rest/ice/heat/warm bath/increase PO fluids/Tylenol/pregnancy support belt  - cyclobenzaprine (FLEXERIL) 10 MG tablet; Take 1 tablet (10 mg total) by mouth 3 (three) times daily as needed for muscle spasms.  Dispense: 30 tablet; Refill: 0   Preterm labor symptoms and general obstetric precautions including but not limited to vaginal bleeding, contractions, leaking of fluid and fetal movement were reviewed in detail with the patient. Please refer to After Visit Summary for other counseling recommendations.   Return in about 5 weeks (around 06/06/2021).  Future Appointments  Date Time Provider Department Center  05/12/2021  1:00 PM [facility] NURSE [facility]  05/12/2021  1:15 PM [facility] US2 [facility]  05/12/2021  2:00 PM [facility] GENETIC COUNSELING RM [facility]  06/06/2021  9:00 AM [facility] None  06/06/2021  9:35 AM [facility], Wilmer Floor, CNM [facility] None    Sharen Counter, CNM

## 2021-05-02 NOTE — Progress Notes (Signed)
Pt states she has had some cramping today, pt rides bus to appts.

## 2021-05-06 ENCOUNTER — Inpatient Hospital Stay (HOSPITAL_COMMUNITY)
Admission: AD | Admit: 2021-05-06 | Discharge: 2021-05-07 | Disposition: A | Discharge status: Home / Self Care | Payer: Medicaid Other | Attending: Obstetrics and Gynecology | Admitting: Obstetrics and Gynecology

## 2021-05-06 ENCOUNTER — Encounter (HOSPITAL_COMMUNITY): Payer: Self-pay | Admitting: Obstetrics and Gynecology

## 2021-05-06 ENCOUNTER — Other Ambulatory Visit: Payer: Self-pay

## 2021-05-06 DIAGNOSIS — K5900 Constipation, unspecified: Secondary | ICD-10-CM | POA: Diagnosis not present

## 2021-05-06 DIAGNOSIS — R102 Pelvic and perineal pain: Secondary | ICD-10-CM | POA: Diagnosis not present

## 2021-05-06 DIAGNOSIS — Z881 Allergy status to other antibiotic agents status: Secondary | ICD-10-CM | POA: Diagnosis not present

## 2021-05-06 DIAGNOSIS — Z9049 Acquired absence of other specified parts of digestive tract: Secondary | ICD-10-CM | POA: Diagnosis not present

## 2021-05-06 DIAGNOSIS — Z888 Allergy status to other drugs, medicaments and biological substances status: Secondary | ICD-10-CM | POA: Insufficient documentation

## 2021-05-06 DIAGNOSIS — O23599 Infection of other part of genital tract in pregnancy, unspecified trimester: Secondary | ICD-10-CM

## 2021-05-06 DIAGNOSIS — O99612 Diseases of the digestive system complicating pregnancy, second trimester: Secondary | ICD-10-CM | POA: Insufficient documentation

## 2021-05-06 DIAGNOSIS — Z8249 Family history of ischemic heart disease and other diseases of the circulatory system: Secondary | ICD-10-CM | POA: Insufficient documentation

## 2021-05-06 DIAGNOSIS — R109 Unspecified abdominal pain: Secondary | ICD-10-CM | POA: Diagnosis not present

## 2021-05-06 DIAGNOSIS — Z3A22 22 weeks gestation of pregnancy: Secondary | ICD-10-CM | POA: Diagnosis not present

## 2021-05-06 DIAGNOSIS — O23592 Infection of other part of genital tract in pregnancy, second trimester: Secondary | ICD-10-CM | POA: Diagnosis not present

## 2021-05-06 DIAGNOSIS — O26892 Other specified pregnancy related conditions, second trimester: Secondary | ICD-10-CM | POA: Insufficient documentation

## 2021-05-06 DIAGNOSIS — M549 Dorsalgia, unspecified: Secondary | ICD-10-CM

## 2021-05-06 DIAGNOSIS — B9689 Other specified bacterial agents as the cause of diseases classified elsewhere: Secondary | ICD-10-CM | POA: Insufficient documentation

## 2021-05-06 LAB — URINALYSIS, ROUTINE W REFLEX MICROSCOPIC
Bilirubin Urine: NEGATIVE
Glucose, UA: NEGATIVE mg/dL
Hgb urine dipstick: NEGATIVE
Ketones, ur: NEGATIVE mg/dL
Leukocytes,Ua: NEGATIVE
Nitrite: NEGATIVE
Protein, ur: NEGATIVE mg/dL
Specific Gravity, Urine: 1.008 (ref 1.005–1.030)
pH: 6 (ref 5.0–8.0)

## 2021-05-06 NOTE — MAU Note (Signed)
I was at work and started having cramping in lower back. I was trying to poop and had a lot of vaginal pressure. Denies VB or d/c. Had BM yesterday but was not much and was hard

## 2021-05-07 DIAGNOSIS — R102 Pelvic and perineal pain: Secondary | ICD-10-CM

## 2021-05-07 DIAGNOSIS — O26892 Other specified pregnancy related conditions, second trimester: Secondary | ICD-10-CM

## 2021-05-07 DIAGNOSIS — K5900 Constipation, unspecified: Secondary | ICD-10-CM

## 2021-05-07 DIAGNOSIS — O99612 Diseases of the digestive system complicating pregnancy, second trimester: Secondary | ICD-10-CM

## 2021-05-07 DIAGNOSIS — R109 Unspecified abdominal pain: Secondary | ICD-10-CM

## 2021-05-07 DIAGNOSIS — Z3A22 22 weeks gestation of pregnancy: Secondary | ICD-10-CM

## 2021-05-07 LAB — WET PREP, GENITAL
Sperm: NONE SEEN
Trich, Wet Prep: NONE SEEN
Yeast Wet Prep HPF POC: NONE SEEN

## 2021-05-07 MED ORDER — DOCUSATE SODIUM 100 MG PO CAPS: 100.0000 mg | ORAL_CAPSULE | Freq: Every day | ORAL | 0 refills | Status: DC

## 2021-05-07 MED ORDER — METRONIDAZOLE 0.75 % VA GEL: 1.0000 | Freq: Every day | VAGINAL | 0 refills | Status: DC

## 2021-05-07 MED ORDER — ACETAMINOPHEN 500 MG PO TABS
1000.0000 mg | ORAL_TABLET | Freq: Once | ORAL | Status: AC
  Administered 2021-05-07: 1000 mg via ORAL
  Filled 2021-05-07: qty 2

## 2021-05-07 MED ORDER — FLEET ENEMA 7-19 GM/118ML RE ENEM: 1.0000 | ENEMA | Freq: Once | RECTAL | Status: DC

## 2021-05-07 MED ORDER — CYCLOBENZAPRINE HCL 5 MG PO TABS
10.0000 mg | ORAL_TABLET | Freq: Once | ORAL | Status: AC
  Administered 2021-05-07: 10 mg via ORAL
  Filled 2021-05-07: qty 2

## 2021-05-07 MED ORDER — POLYETHYLENE GLYCOL 3350 17 G PO PACK: 17.0000 g | PACK | Freq: Two times a day (BID) | ORAL | 0 refills | Status: DC

## 2021-05-07 NOTE — Discharge Instructions (Signed)
You have constipation which is hard stools that are difficult to pass. It is important to have regular bowel movements every 1-3 days that are soft and easy to pass. Hard stools increase your risk of hemorrhoids and are very uncomfortable.   To prevent constipation you can increase the amount of fiber in your diet. Examples of foods with fiber are leafy greens, whole grain breads, oatmeal and other grains.  It is also important to drink at least eight 8oz glass of water everyday.   If you have not has a bowel movement in 4-5 days you made need to clean out your bowel.  This will have establish normal movement through your bowel.    Miralax Clean out Take miralax twice daily. You can use any fluid that appeals to you (gatorade, water, juice) Continue to drink at least eight 8 oz glasses of water throughout the day You can repeat daily until you are having regular bowel movements.  Once having regular bowel movements, start taking the colace daily at bedtime.   After you are cleaned out: - Start Colace100mg  daily - Start a daily fiber supplement like metamucil or citrucel - You can safely use enemas in pregnancy  - if you are having diarrhea you can reduce to Colace once a day or miralax every other day or a 1/2 capful daily.

## 2021-05-07 NOTE — Progress Notes (Signed)
Written and verbal d/c instructions given and understanding voiced. 

## 2021-05-07 NOTE — MAU Provider Note (Signed)
History     CSN: [id number]  Arrival date and time: 05/06/21 2230   Event Date/Time   First Provider Initiated Contact with Patient 05/07/21 0003      Chief Complaint  Patient presents with   Pelvic Pain   Melissa [name] is a 20 y.o. G3P1011 at [redacted]w[redacted]d who receives care at [facility].  She presents today for Pelvic Pain.  She states while at work, she started to have vaginal pressure around 7pm.  She states it is intermittent in nature and started when she was attempting ot have a bowel movement.  She states the pain is sharp and rates pain a 8/10.  She reports she had similar pain last week and feels it is worse today because she also has low back pain.  She denies pain or difficulty with urination, but endorses constipation.  She states she had a bowel movement yesterday but it was small and hard to pass.  Patient endorses fetal movement and denies recent sexual activity.    OB History     Gravida  3   Para  1   Term  1   Preterm      AB  1   Living  1      SAB      IAB      Ectopic      Multiple  0   Live Births  1           Past Medical History:  Diagnosis Date   Anemia    Anxiety    Asthma    inhaler. last attack June 2019   Congenital hydronephrosis 2001   Constipation    Depression    Headache    migraines   PID (pelvic inflammatory disease)    Rh negative state in antepartum period 02/05/2019   SAB (spontaneous abortion) 08/25/2020   Seizures ([name])    last one in 2015 - on meds   TBI (traumatic brain injury) ([name]) 2006    Past Surgical History:  Procedure Laterality Date   APPENDECTOMY     HERNIA REPAIR     INTESTINAL MALROTATION REPAIR  2001    Family History  Problem Relation Age of Onset   Depression Mother    Stroke Mother    Obesity Mother    Post-traumatic stress disorder Mother    Anxiety disorder Mother    Hypertension Mother    Diabetes Mother    Schizophrenia Father    Kidney disease Father     Social  History   Tobacco Use   Smoking status: Every Day    Types: Cigarettes, Cigars   Smokeless tobacco: Never   Tobacco comments:    black and milds, none since + UPT  Vaping Use   Vaping Use: Never used  Substance Use Topics   Alcohol use: Never   Drug use: Not Currently    Types: Marijuana    Comment: last 2020    Allergies:  Allergies  Allergen Reactions   Benadryl [Diphenhydramine Hcl] Other (See Comments)    Causes seizures    Gluten Meal Nausea And Vomiting   Zithromax [Azithromycin] Anaphylaxis and Swelling   Lactose Intolerance (Gi) Diarrhea    Medications Prior to Admission  Medication Sig Dispense Refill Last Dose   ferrous sulfate 325 (65 FE) MG tablet Take 325 mg by mouth daily with breakfast.   Past Week   Prenatal Vit-Fe Fumarate-FA (PREPLUS) 27-1 MG TABS Take 1 tablet by mouth daily. 30  tablet 13 05/06/2021   acetaminophen (TYLENOL) 500 MG tablet Take 500 mg by mouth every 6 (six) hours as needed.   More than a month   cyclobenzaprine (FLEXERIL) 10 MG tablet Take 1 tablet (10 mg total) by mouth 3 (three) times daily as needed for muscle spasms. 30 tablet 0    ondansetron (ZOFRAN-ODT) 4 MG disintegrating tablet Take 1 tablet (4 mg total) by mouth every 8 (eight) hours as needed for nausea or vomiting. 15 tablet 0     Review of Systems  Constitutional:  Negative for chills and fever.  Respiratory:  Negative for cough and shortness of breath.   Gastrointestinal:  Positive for constipation. Negative for abdominal pain, diarrhea, nausea and vomiting.  Genitourinary:  Positive for pelvic pain and vaginal discharge (None currently). Negative for difficulty urinating, dysuria and vaginal bleeding.  Musculoskeletal:  Positive for back pain.  Physical Exam   Blood pressure 113/66, pulse 85, temperature 97.9 F (36.6 C), resp. rate 20, height 5\' 3"  (1.6 m), weight 88.5 kg, last menstrual period 12/01/2020, unknown if currently breastfeeding.  Physical Exam Vitals  reviewed.  Constitutional:      Appearance: Normal appearance.  HENT:     Head: Normocephalic and atraumatic.  Eyes:     Conjunctiva/sclera: Conjunctivae normal.  Cardiovascular:     Rate and Rhythm: Normal rate and regular rhythm.  Pulmonary:     Effort: Pulmonary effort is normal.  Abdominal:     General: Bowel sounds are normal.     Palpations: Abdomen is soft.     Tenderness: There is generalized abdominal tenderness.  Genitourinary:    Labia:        Right: No tenderness or lesion.        Left: No tenderness or lesion.      Vagina: Vaginal discharge present.     Cervix: Erythema and eversion present. No discharge.     Comments: Speculum Exam: -Normal External Genitalia: Non tender, no apparent discharge at introitus.  -Vaginal Vault: Pink mucosa with good rugae. Moderate amt thin gray discharge -wet prep collected -Cervix:Pink, Ecotropic. No lesions, cysts, or polyps.  Appears closed. No active bleeding from os-GC/CT collected -Bimanual Exam:  Cervix Closed Musculoskeletal:     Cervical back: Normal range of motion.  Neurological:     Mental Status: She is alert and oriented to person, place, and time.  Psychiatric:        Mood and Affect: Mood normal.        Behavior: Behavior normal.        Thought Content: Thought content normal.    MAU Course  Procedures Results for orders placed or performed during the hospital encounter of 05/06/21 (from the past 24 hour(s))  Urinalysis, Routine w reflex microscopic Urine, Clean Catch     Status: None   Collection Time: 05/06/21 10:55 PM  Result Value Ref Range   Color, Urine YELLOW YELLOW   APPearance CLEAR CLEAR   Specific Gravity, Urine 1.008 1.005 - 1.030   pH 6.0 5.0 - 8.0   Glucose, UA NEGATIVE NEGATIVE mg/dL   Hgb urine dipstick NEGATIVE NEGATIVE   Bilirubin Urine NEGATIVE NEGATIVE   Ketones, ur NEGATIVE NEGATIVE mg/dL   Protein, ur NEGATIVE NEGATIVE mg/dL   Nitrite NEGATIVE NEGATIVE   Leukocytes,Ua NEGATIVE  NEGATIVE  Wet prep, genital     Status: Abnormal   Collection Time: 05/07/21 12:22 AM  Result Value Ref Range   Yeast Wet Prep HPF POC NONE SEEN NONE SEEN  Trich, Wet Prep NONE SEEN NONE SEEN   Clue Cells Wet Prep HPF POC PRESENT (A) NONE SEEN   WBC, Wet Prep HPF POC MANY (A) NONE SEEN   Sperm NONE SEEN     MDM Pelvic Exam Wet Prep, GC/CT, UA Muscle Relaxant  Assessment and Plan  20 year old, G3P1011  SIUP at 22.3weeks Back Pain Abdominal Cramping Constipation.   -Reviewed POC with patient. -Exam performed and findings discussed.  -Informed that cervix is closed and no signs of labor noted. -Cultures collected and pending.  -Discussed usage of enema for constipation, patient agreeable.  -Fleets ordered. -However after speaking with patient will send Miralax and Colace to pharmacy for modified clean out.  -Will cancel enema.  -Patient offered and accepts pain medication.  -Flexeril and tylenol ordered. -Will monitor and reassess.   Cherre Robins 05/07/2021, 12:03 AM   Reassessment (1:25 AM) Bacterial Vaginosis  -Results return as above. -Rx for Miralax and Colace sent to pharmacy on file.  -Provider to bedside to discuss results. -Informed of clue cells suggestive of BV.  Reviewed treatment. Patient desires metrogel. -Rx sent to pharmacy on file.  -Patient questions if she will receive an ultrasound and informed it is not indicated in this situation. -Encouraged to use maternity belt or belly support band while working to help reduce cramping. -Further encouraged increased intake and rest when possible. -Instructed to keep next appt as scheduled. -Encouraged to call primary office or return to MAU if symptoms worsen or with the onset of new symptoms. -Discharged to home in stable condition.  Cherre Robins MSN, CNM Advanced Practice Provider, [facility] [employer]

## 2021-05-09 LAB — GC/CHLAMYDIA PROBE AMP (~~LOC~~) NOT AT ARMC
Chlamydia: NEGATIVE
Comment: NEGATIVE
Comment: NORMAL
Neisseria Gonorrhea: NEGATIVE

## 2021-05-11 ENCOUNTER — Ambulatory Visit: Payer: Medicaid Other

## 2021-05-11 ENCOUNTER — Ambulatory Visit: Payer: Self-pay

## 2021-05-12 ENCOUNTER — Other Ambulatory Visit: Payer: Self-pay

## 2021-05-12 ENCOUNTER — Ambulatory Visit: Payer: Medicaid Other | Admitting: *Deleted

## 2021-05-12 ENCOUNTER — Ambulatory Visit: Payer: Medicaid Other | Attending: Maternal & Fetal Medicine

## 2021-05-12 ENCOUNTER — Encounter: Payer: Self-pay | Admitting: *Deleted

## 2021-05-12 ENCOUNTER — Ambulatory Visit (HOSPITAL_BASED_OUTPATIENT_CLINIC_OR_DEPARTMENT_OTHER): Payer: Medicaid Other

## 2021-05-12 VITALS — BP 108/72 | HR 88

## 2021-05-12 DIAGNOSIS — O99352 Diseases of the nervous system complicating pregnancy, second trimester: Secondary | ICD-10-CM | POA: Diagnosis not present

## 2021-05-12 DIAGNOSIS — G40909 Epilepsy, unspecified, not intractable, without status epilepticus: Secondary | ICD-10-CM

## 2021-05-12 DIAGNOSIS — O35EXX0 Maternal care for other (suspected) fetal abnormality and damage, fetal genitourinary anomalies, not applicable or unspecified: Secondary | ICD-10-CM

## 2021-05-12 DIAGNOSIS — D573 Sickle-cell trait: Secondary | ICD-10-CM

## 2021-05-12 DIAGNOSIS — Z362 Encounter for other antenatal screening follow-up: Secondary | ICD-10-CM | POA: Insufficient documentation

## 2021-05-12 DIAGNOSIS — O358XX0 Maternal care for other (suspected) fetal abnormality and damage, not applicable or unspecified: Secondary | ICD-10-CM

## 2021-05-12 DIAGNOSIS — E669 Obesity, unspecified: Secondary | ICD-10-CM | POA: Diagnosis not present

## 2021-05-12 DIAGNOSIS — O99212 Obesity complicating pregnancy, second trimester: Secondary | ICD-10-CM | POA: Diagnosis not present

## 2021-05-12 DIAGNOSIS — Z3A23 23 weeks gestation of pregnancy: Secondary | ICD-10-CM

## 2021-05-12 NOTE — Progress Notes (Signed)
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   EDC: 09/07/2021 LMP: 12/01/2020 Referring Provider:  Adam Phenix, MD  EGA: [redacted]w[redacted]d Genetic Counselor: Teena Dunk, MS, CGC  OB Hx: [id number] Date of Appointment: 05/12/2021  Accompanied by: Father of the current pregnancy Melissa [name]) Face to Face Time: 30 Minutes   Previous Testing Completed: Sherryann previously completed Non-Invasive Prenatal Screening (NIPS) in this pregnancy (scanned into Epic under the Media tab). The result is low risk, consistent with a female fetus. This screening significantly reduces the risk that the current pregnancy has Down syndrome, Trisomy 15, Trisomy 13, Monosomy X, and Triploidy. Arlana previously completed [facility] Carrier Screening in a previous pregnancy (scanned into Epic under the Media tab). She screened to be a carrier for Sickle Cell Disease: Hb A/S (see genetic counseling portion of the note below). She screened to not be a carrier for 13 out of 14 of the conditions on the panel. A negative result on carrier screening reduces the likelihood of being a carrier, however, does not entirely rule out the possibility. Charitie previously completed a maternal serum AFP screen in this pregnancy. The result is screen negative.    Medical History:  This is Melissa [name]'s 3rd pregnancy. She has a living, healthy daughter from a previous partner. She has had one early spontaneous loss with Melissa [name]. Reports she takes iron and prenatal vitamins. Personal history of epilepsy. Reports she has not had a seizure since 2015 and is not on any medications to prevent seizures. Denies personal history of diabetes, high blood pressure, and thyroid conditions. Denies bleeding, infections, and fevers in this pregnancy. Denies using tobacco, alcohol, or street drugs in this pregnancy.   Family History: A pedigree was created and scanned into Epic under the Media tab. Yolando  reported to genetic counseling that her full brother may have a hole in his heart but if he does it is "not severe". She reports she has 5 other healthy siblings.  Maternal ethnicity reported as African American/Black and paternal ethnicity reported as African American/Black. Family history not remarkable for consanguinity, intellectual disability, autism spectrum disorder, mental illness, multiple spontaneous abortions, still births, or unexplained neonatal death.     Genetic Counseling:   Risk for Sickle Cell Disease. Caelie is a carrier of sickle cell trait (Hemoglobin S: Hb A/S). Hemoglobin S is a structural hemoglobin variant that replaces one of the ?-chains of hemoglobin. It is caused by mutations in the HBB gene. Carriers of sickle cell trait typically do not have any clinical features, however, may experience sickling under extreme conditions. Individuals who carry sickle cell trait would have a 25% risk to have offspring affected with Sickle Cell Disease if their reproductive partner is also found to be a carrier for a ?-globin chain abrnomality (recessive inheritance). Sickle Cell Disease caused by the homozygous HBB variant p.Glu6Val (Hb S/S), is the most common cause of Sickle Cell Disease in the [place]. Other Sickle Cell Disorders caused by compound heterozygous HBB pathogenic variants includes sickle-hemoglobin C disease (Hb S/C) and two types of sickle ?-thalassemia (Hb S/?+ thalassemia and Hb S/? thalassemia). Other ?-globin chain variants such as D-Punjab, O-Arab, and E also result in Sickle Cell Disorders when inherited with HbS. Genetic counseling reviewed that most individuals with Sickle Cell Disease are healthy at birth and become symptomatic later in infancy or childhood after fetal hemoglobin levels decrease. Symptoms include infants with spontaneous painful swelling of the hands/feet, recurrent  episodes of severe pain with no other identified etiology, unexplained anemia not  related to iron deficiency, pallor, jaundice, pneumococcal sepsis or meningitis, severe anemia with splenic enlargement, childhood stroke, etc. Given that Melissa [name] is a carrier of sickle cell trait genetic counseling recommended screenig Melissa [name] for ? Hemoglobinopathies. Genetic counseling reviewed that Melissa [name] is of African American ancestry, therefore his risk to be a carrier for Sickle Cell Disease is approximately 1 in 10. Melissa [name] declined carrier screening for ? Hemoglobinopathies. Genetic counseling reviewed with the couple that Sickle Cell Disease is one of the conditions included in [place]'s newborn screening program.  Birth Defects. All babies have approximately a 3-5% risk for a birth defect and a majority of these defects cannot be detected through the screening or diagnostic testing listed below. Ultrasound may detect some birth defects, but it may not detect all birth defects. About half of pregnancies with Down syndrome do not show any soft markers on ultrasound. A normal ultrasound does not guarantee a healthy pregnancy.   Testing/Screening Options:   Carrier Screening. Per the ACOG Committee Opinion 691, if an individual is found to be a carrier for a specific condition, the individual's reproductive partner should be offered testing in order to receive informed genetic counsleing about potential reproductive outcomes. Genetic counseling offered Melissa [name] carrier screening for Beta Hemoglobinopathies given that Melissa [name] is a carrier for Sickle Cell Disease.     Patient Plan:  Proceed with: Routine prenatal care All questions were answered.  Declined: Carrier screening for the father of the pregnancy Melissa [name]) for Beta Hemoglobinopathies   Thank you for sharing in the care of Melissa [name] with [place].  Please do not hesitate to contact us if you have any questions.  Teena Dunk, MS, [facility]

## 2021-05-26 ENCOUNTER — Other Ambulatory Visit: Payer: Self-pay

## 2021-05-26 ENCOUNTER — Ambulatory Visit (HOSPITAL_COMMUNITY)
Admission: EM | Admit: 2021-05-26 | Discharge: 2021-05-26 | Disposition: A | Discharge status: Home / Self Care | Payer: Medicaid Other | Attending: Emergency Medicine | Admitting: Emergency Medicine

## 2021-05-26 ENCOUNTER — Encounter (HOSPITAL_COMMUNITY): Payer: Self-pay | Admitting: Emergency Medicine

## 2021-05-26 DIAGNOSIS — B349 Viral infection, unspecified: Secondary | ICD-10-CM | POA: Diagnosis not present

## 2021-05-26 DIAGNOSIS — Z1152 Encounter for screening for COVID-19: Secondary | ICD-10-CM | POA: Insufficient documentation

## 2021-05-26 LAB — POC INFLUENZA A AND B ANTIGEN (URGENT CARE ONLY)
INFLUENZA A ANTIGEN, POC: NEGATIVE
INFLUENZA B ANTIGEN, POC: NEGATIVE

## 2021-05-26 LAB — SARS CORONAVIRUS 2 (TAT 6-24 HRS): SARS Coronavirus 2: NEGATIVE

## 2021-05-26 NOTE — Discharge Instructions (Addendum)
You can try any of the following over-the-counter medicines to provide you comfort  You will be notified if your COVID or flu test is positive, per the CDC guidelines a 5-day quarantine is required for positives COVID test from the beginning of your symptoms therefore on 05/31/2021 you may resume normal activities, if negative you may resume day-to-day activities as normal

## 2021-05-26 NOTE — ED Provider Notes (Signed)
[facility]    CSN: [id number] Arrival date & time: 05/26/21  1212      History   Chief Complaint Chief Complaint  Patient presents with  . Fever  . Cough  . Generalized Body Aches    HPI Melissa [name] is a 20 y.o. female.   Patient presents with productive cough with yellow sputum, intermittent fevers, chills, body aches, nasal congestion, rhinorrhea, nausea beginning this morning.  Known sick contact.  Currently 6 months pregnant.  Endorses she felt sharp pain in abdomen this morning but has felt movement with baby.  Denies vaginal bleeding or spotting.  Has not attempted treatment of symptoms.    Past Medical History:  Diagnosis Date  . Anemia   . Anxiety   . Asthma    inhaler. last attack June 2019  . Congenital hydronephrosis 2001  . Constipation   . Depression   . Headache    migraines  . PID (pelvic inflammatory disease)   . Rh negative state in antepartum period 02/05/2019  . SAB (spontaneous abortion) 08/25/2020  . Seizures ([name])    last one in 2015 - on meds  . TBI (traumatic brain injury) 2006    Patient Active Problem List   Diagnosis Date Noted  . [redacted] weeks gestation of pregnancy 03/24/2021  . Supervision of low-risk pregnancy 02/09/2021  . Low back strain, sequela 11/10/2020  . Migraine variant 08/04/2020  . Abnormal involuntary movements 08/04/2020  . History of seizures 05/07/2020  . Migraine with aura and without status migrainosus, not intractable 08/12/2019  . Seizures ([name])   . Sickle cell trait ([name]) 02/13/2019  . 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 DMDD (disruptive mood dysregulation disorder) ([name]) 07/21/2016  . 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 Chronic constipation 08/03/2015  . 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 Migraine without aura and without status migrainosus, not intractable 04/16/2015  . Episodic  tension-type headache, not intractable 04/16/2015  . 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 GAD (generalized anxiety disorder) 11/25/2013    Past Surgical History:  Procedure Laterality Date  . APPENDECTOMY    . HERNIA REPAIR    . INTESTINAL MALROTATION REPAIR  2001    OB History     Gravida  3   Para  1   Term  1   Preterm      AB  1   Living  1      SAB      IAB      Ectopic      Multiple  0   Live Births  1            Home Medications    Prior to Admission medications   Medication Sig Start Date End Date Taking? Authorizing Provider  Prenatal Vit-Fe Fumarate-FA (PREPLUS) 27-1 MG TABS Take 1 tablet by mouth daily. 02/09/21  Yes Brock Bad, MD  acetaminophen (TYLENOL) 500 MG tablet Take 500 mg by mouth every 6 (six) hours as needed.    [provider]  cyclobenzaprine (FLEXERIL) 10 MG tablet Take 1 tablet (10 mg total) by mouth 3 (three) times daily as needed for muscle spasms. 05/02/21   Leftwich-Kirby, Wilmer Floor, CNM  docusate sodium (COLACE) 100 MG capsule Take 1 capsule (100 mg total) by mouth at bedtime. Start after completion of miralax. 05/07/21   Gerrit Heck, CNM  ferrous sulfate 325 (65 FE) MG tablet  Take 325 mg by mouth daily with breakfast.    [provider]  metroNIDAZOLE (METROGEL VAGINAL) 0.75 % vaginal gel Place 1 Applicatorful vaginally at bedtime. Insert one applicator, at bedtime, for 5 nights. 05/07/21   Gerrit Heck, CNM  ondansetron (ZOFRAN-ODT) 4 MG disintegrating tablet Take 1 tablet (4 mg total) by mouth every 8 (eight) hours as needed for nausea or vomiting. 02/05/21   Mardella Layman, MD  polyethylene glycol (MIRALAX) 17 g packet Take 17 g by mouth 2 (two) times daily. 05/07/21   Gerrit Heck, CNM  rizatriptan (MAXALT-MLT) 10 MG disintegrating tablet Take 1 tablet at onset of migraine with 2 ibuprofen may repeat an additional tablet in 2 hours if needed 05/07/20 06/09/20  Deetta Perla, MD    Family  History Family History  Problem Relation Age of Onset  . Depression Mother   . Stroke Mother   . Obesity Mother   . Post-traumatic stress disorder Mother   . Anxiety disorder Mother   . Hypertension Mother   . Diabetes Mother   . Schizophrenia Father   . Kidney disease Father     Social History Social History   Tobacco Use  . Smoking status: Every Day    Types: Cigarettes, Cigars  . Smokeless tobacco: Never  . Tobacco comments:    black and milds, none since + UPT  Vaping Use  . Vaping Use: Never used  Substance Use Topics  . Alcohol use: Never  . Drug use: Not Currently    Types: Marijuana    Comment: last 2020     Allergies   Benadryl [diphenhydramine hcl], Gluten meal, Zithromax [azithromycin], and Lactose intolerance (gi)   Review of Systems Review of Systems  Constitutional:  Positive for chills, fatigue and fever. Negative for activity change, appetite change, diaphoresis and unexpected weight change.  HENT:  Positive for congestion and rhinorrhea. Negative for dental problem, drooling, ear discharge, ear pain, facial swelling, hearing loss, mouth sores, nosebleeds, postnasal drip, sinus pressure, sinus pain, sneezing, sore throat, tinnitus, trouble swallowing and voice change.   Respiratory:  Positive for cough. Negative for apnea, choking, chest tightness, shortness of breath, wheezing and stridor.   Cardiovascular: Negative.   Gastrointestinal: Negative.   Skin: Negative.   Neurological: Negative.     Physical Exam Triage Vital Signs ED Triage Vitals  Enc Vitals Group     BP 05/26/21 1240 111/76     Pulse Rate 05/26/21 1240 98     Resp --      Temp 05/26/21 1240 (!) 97.5 F (36.4 C)     Temp Source 05/26/21 1240 Oral     SpO2 05/26/21 1240 97 %     Weight --      Height --      Head Circumference --      Peak Flow --      Pain Score 05/26/21 1239 8     Pain Loc --      Pain Edu? --      Excl. in GC? --    No data found.  Updated Vital  Signs BP 111/76 (BP Location: Left Arm)   Pulse 98   Temp (!) 97.5 F (36.4 C) (Oral)   LMP 12/01/2020   SpO2 97%   Visual Acuity Right Eye Distance:   Left Eye Distance:   Bilateral Distance:    Right Eye Near:   Left Eye Near:    Bilateral Near:     Physical Exam Constitutional:  Appearance: Normal appearance. She is normal weight.  HENT:     Head: Normocephalic.     Right Ear: Tympanic membrane, ear canal and external ear normal.     Left Ear: Tympanic membrane, ear canal and external ear normal.     Nose: Congestion and rhinorrhea present.     Mouth/Throat:     Mouth: Mucous membranes are moist.     Pharynx: Posterior oropharyngeal erythema present.  Eyes:     Extraocular Movements: Extraocular movements intact.  Cardiovascular:     Rate and Rhythm: Normal rate and regular rhythm.     Pulses: Normal pulses.     Heart sounds: Normal heart sounds.  Pulmonary:     Effort: Pulmonary effort is normal.     Breath sounds: Normal breath sounds.  Musculoskeletal:     Cervical back: Normal range of motion and neck supple.  Skin:    General: Skin is warm and dry.  Neurological:     Mental Status: She is alert and oriented to person, place, and time. Mental status is at baseline.  Psychiatric:        Mood and Affect: Mood normal.        Behavior: Behavior normal.     UC Treatments / Results  Labs (all labs ordered are listed, but only abnormal results are displayed) Labs Reviewed  SARS CORONAVIRUS 2 (TAT 6-24 HRS)  POC INFLUENZA A AND B ANTIGEN (URGENT CARE ONLY)    EKG   Radiology No results found.  Procedures Procedures (including critical care time)  Medications Ordered in UC Medications - No data to display  Initial Impression / Assessment and Plan / UC Course  I have reviewed the triage vital signs and the nursing notes.  Pertinent labs & imaging results that were available during my care of the patient were reviewed by me and considered in my  medical decision making (see chart for details).  Viral illness  Etiology of symptoms most likely viral, discussed with patient  1.  Over-the-counter medications for symptom management 2.  COVID and flu test pending, will notify if positive 3.  Patient given strict return precautions to go to [facility] if abdominal pain increases or decrease in fetal movement Final Clinical Impressions(s) / UC Diagnoses   Final diagnoses:  Viral illness     Discharge Instructions      You can try any of the following over-the-counter medicines to provide you comfort  You will be notified if your COVID or flu test is positive, per the CDC guidelines a 5-day quarantine is required for positives COVID test from the beginning of your symptoms therefore on 05/31/2021 you may resume normal activities, if negative you may resume day-to-day activities as normal    ED Prescriptions   None    PDMP not reviewed this encounter.   Valinda Hoar, NP 05/26/21 [phone]

## 2021-05-26 NOTE — ED Triage Notes (Signed)
Pt c/o cough, bodyaches, chest congestion,fever, and nausea that started today.

## 2021-06-04 IMAGING — US US OB COMP LESS 14 WK
1 series · 15 of 28 positions shown · non-contrast
Comparison: None.

CLINICAL DATA: Bleeding

EXAM:
OBSTETRIC <14 WK ULTRASOUND
TECHNIQUE: Transabdominal ultrasound was performed for evaluation of the
gestation as well as the maternal uterus and adnexal regions.

[Series 1: us ob comp less 14 wk · 37 acquisitions, 15 frames shown]
[im 1/37]
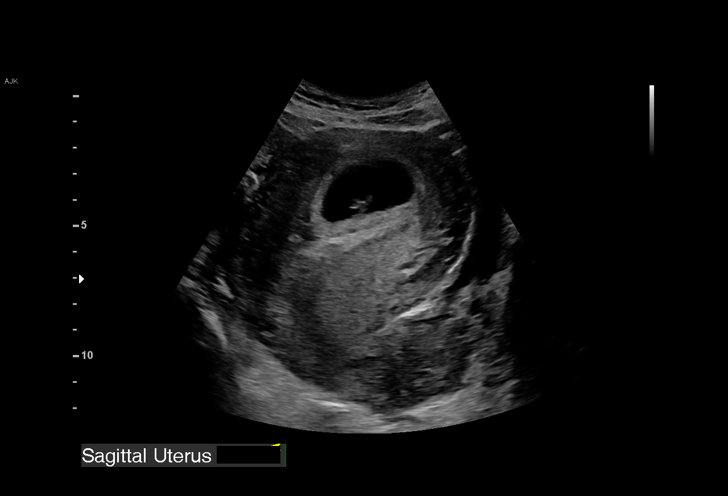
[im 3/37]
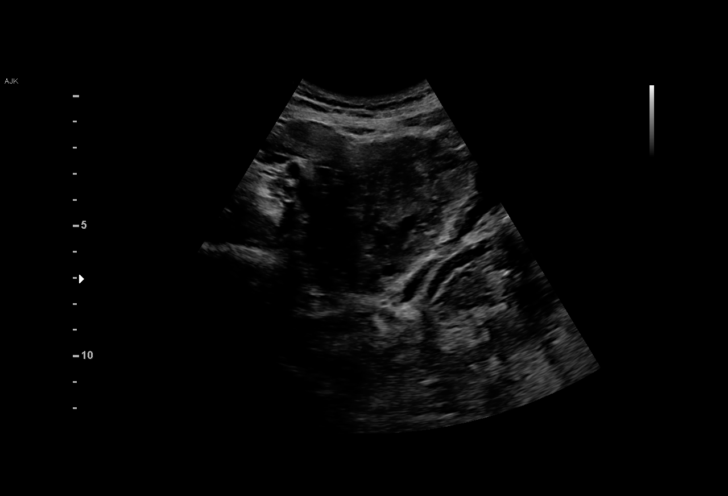
[im 6/37]
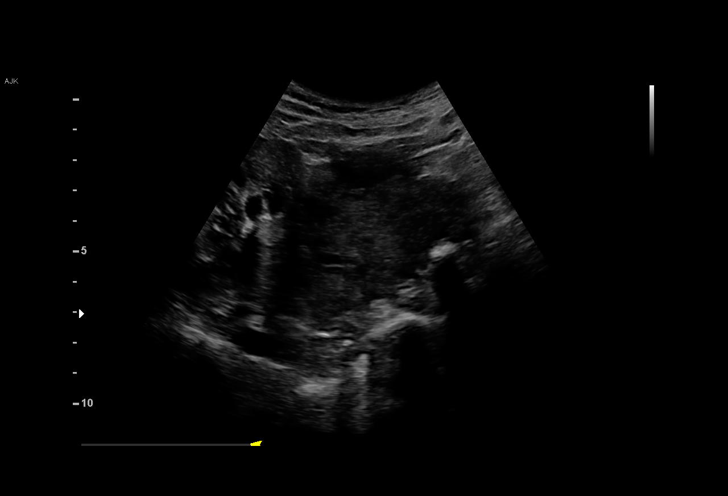
[im 9/37]
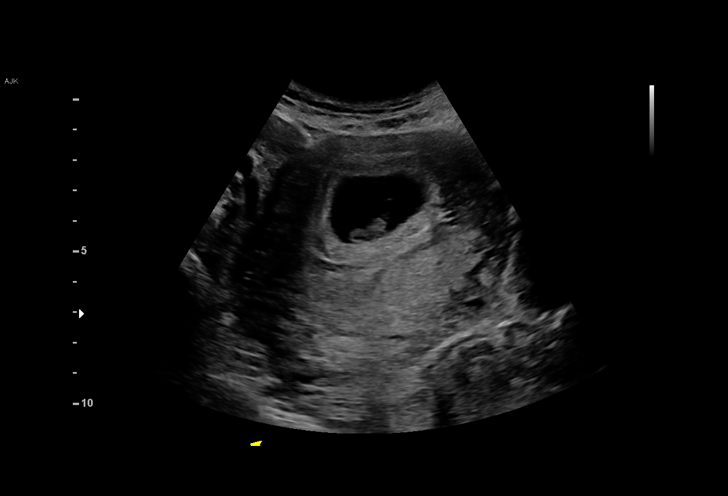
[im 11/37]
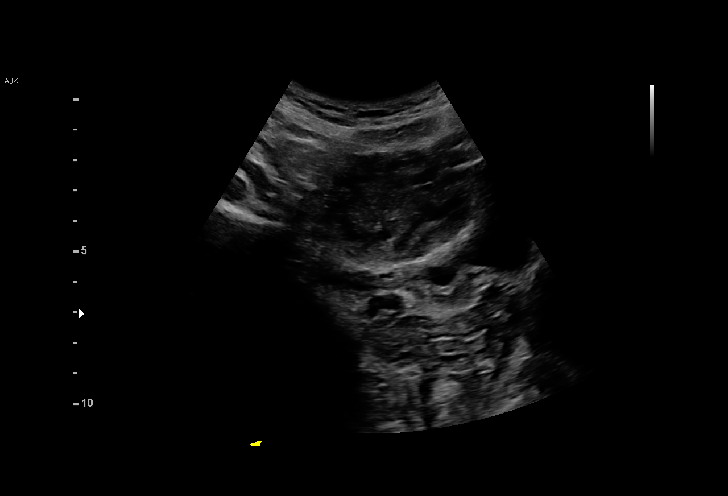
[im 14/37]
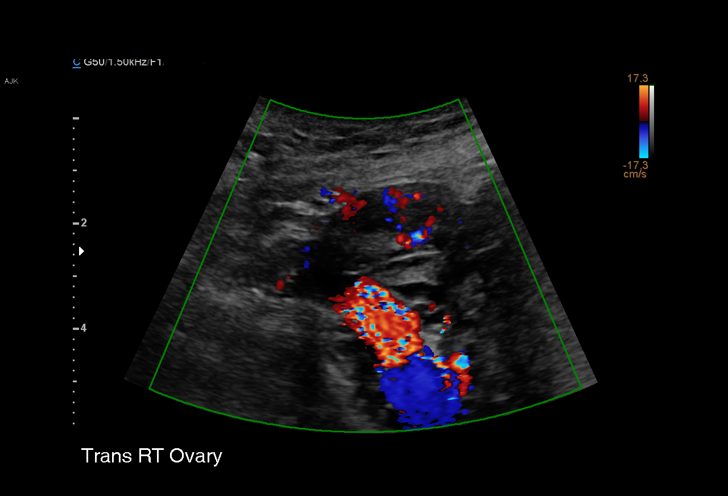
[im 17/37]
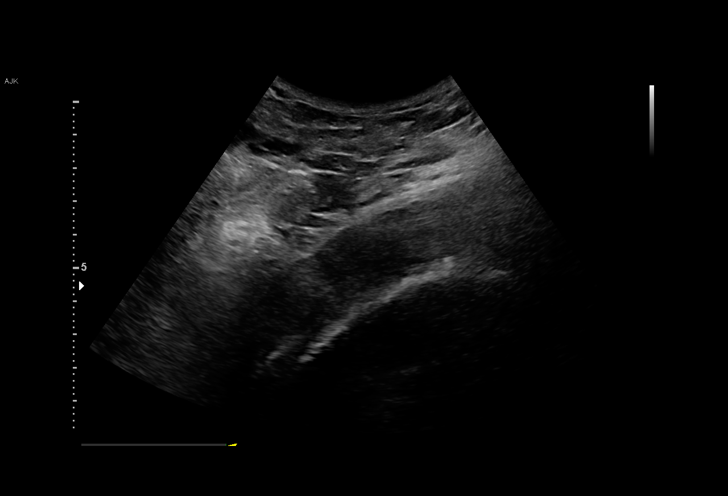
[im 19/37]
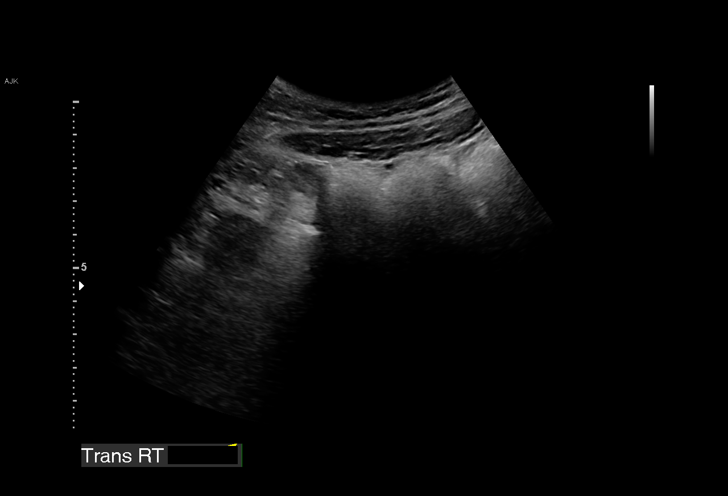
[im 21/37]
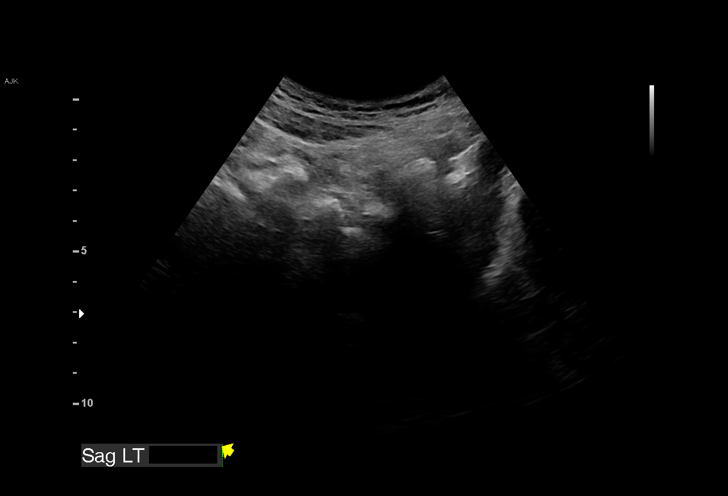
[im 23/37]
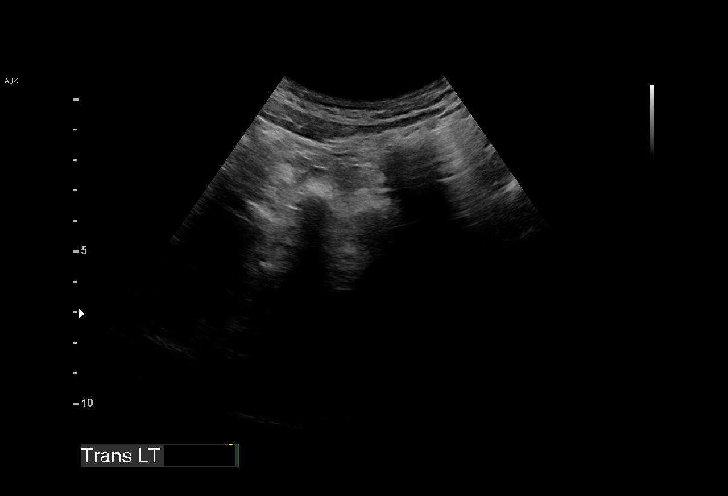
[im 26/37]
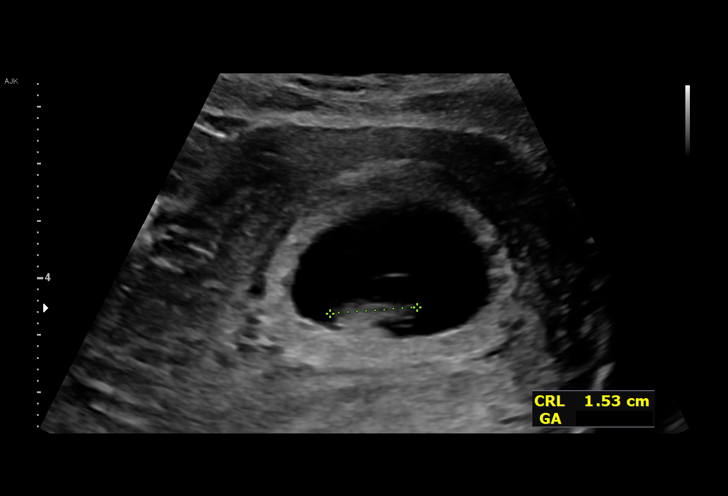
[im 29/37]
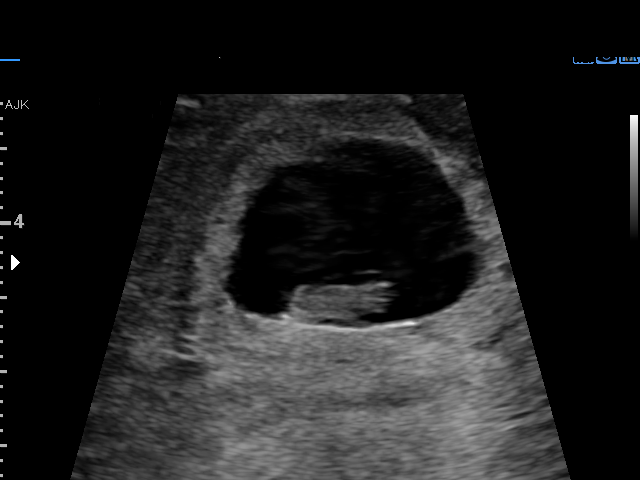
[im 31/37]
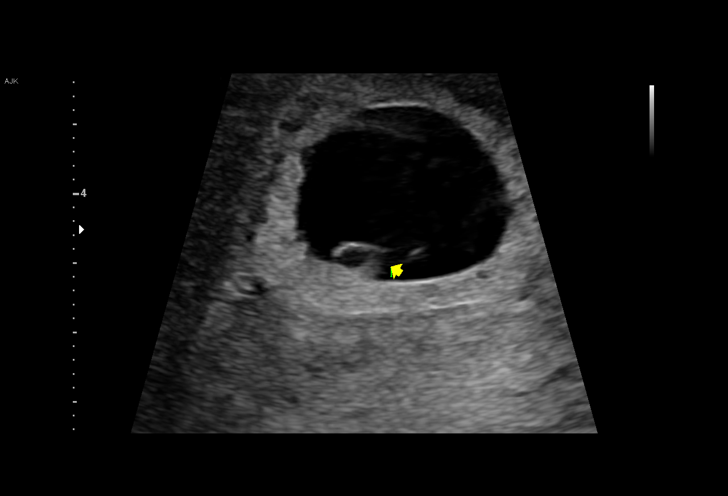
[im 34/37]
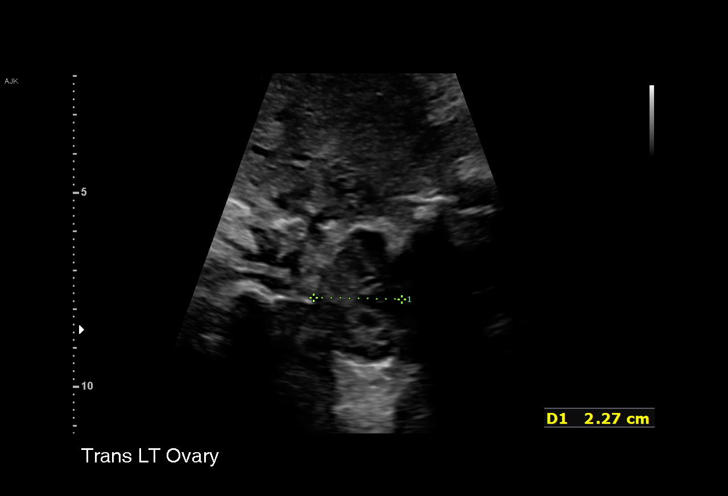
[im 37/37]
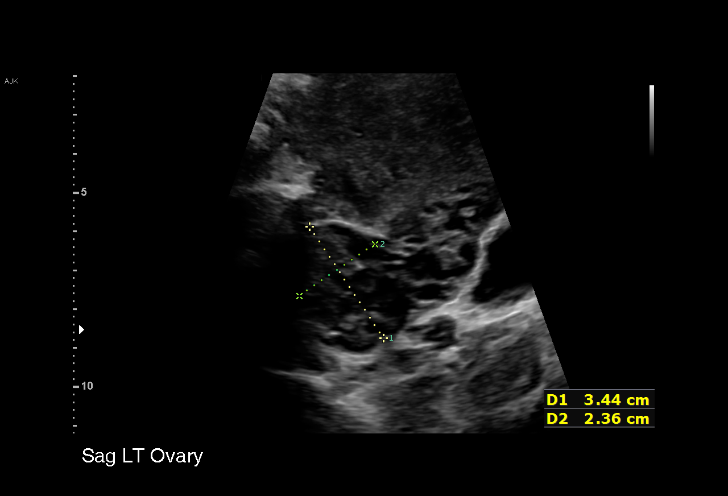

[15 of 28 positions shown; findings below may reference images not displayed]

FINDINGS: Intrauterine gestational sac: Single

Yolk sac:  Visualized.

Embryo:  Visualized.

Cardiac Activity: Visualized.

Heart Rate: 140 bpm

CRL:  15.3 mm 7 w 6 d                  US EDC: 02/13/2021

Subchorionic hemorrhage:  None visualized.

Maternal uterus/adnexae: There is no significant maternal
abnormality.
IMPRESSION: Single live IUP at 7 weeks and 6 days.

## 2021-06-06 ENCOUNTER — Encounter: Payer: Medicaid Other | Admitting: Advanced Practice Midwife

## 2021-06-06 ENCOUNTER — Ambulatory Visit: Payer: Self-pay

## 2021-06-06 ENCOUNTER — Other Ambulatory Visit: Payer: Medicaid Other

## 2021-06-06 NOTE — Progress Notes (Deleted)
   PRENATAL VISIT NOTE  Subjective:  Melissa [name] is a 20 y.o. G3P1011 at [redacted]w[redacted]d being seen today for ongoing prenatal care.  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  Patient reports {sx:14538}.   .  .   . Denies leaking of fluid.   The following portions of the patient's history were reviewed and updated as appropriate: allergies, current medications, past family history, past medical history, past social history, past surgical history and problem list.   Objective:  There were no vitals filed for this visit.  Fetal Status:           General:  Alert, oriented and cooperative. Patient is in no acute distress.  Skin: Skin is warm and dry. No rash noted.   Cardiovascular: Normal heart rate noted  Respiratory: Normal respiratory effort, no problems with respiration noted  Abdomen: Soft, gravid, appropriate for gestational age.        Pelvic: {Blank single:19197::"Cervical exam performed in the presence of a chaperone","Cervical exam deferred"}        Extremities: Normal range of motion.     Mental Status: Normal mood and affect. Normal behavior. Normal judgment and thought content.    Assessment and Plan:  Pregnancy: G3P1011 at [redacted]w[redacted]d 1. Encounter for supervision of low-risk pregnancy, antepartum ***  2. History of seizures ***  3. [redacted] weeks gestation of pregnancy ***  4. Depression affecting pregnancy ***  {Blank single:19197::"Term","Preterm"} labor symptoms and general obstetric precautions including but not limited to vaginal bleeding, contractions, leaking of fluid and fetal movement were reviewed in detail with the patient. Please refer to After Visit Summary for other counseling recommendations.   No follow-ups on file.  Future Appointments  Date Time Provider Department Center  06/06/2021  9:00 AM [facility] None  06/06/2021  9:35 AM Leftwich-Kirby, Wilmer Floor, CNM [facility] None    Sharen Counter, CNM

## 2021-06-14 ENCOUNTER — Other Ambulatory Visit: Payer: Medicaid Other

## 2021-06-14 ENCOUNTER — Ambulatory Visit (INDEPENDENT_AMBULATORY_CARE_PROVIDER_SITE_OTHER): Payer: Medicaid Other | Admitting: Obstetrics

## 2021-06-14 ENCOUNTER — Other Ambulatory Visit: Payer: Self-pay

## 2021-06-14 ENCOUNTER — Encounter: Payer: Self-pay | Admitting: Obstetrics

## 2021-06-14 ENCOUNTER — Other Ambulatory Visit (HOSPITAL_COMMUNITY)
Admission: RE | Admit: 2021-06-14 | Discharge: 2021-06-14 | Disposition: A | Discharge status: Home / Self Care | Payer: Medicaid Other | Source: Ambulatory Visit | Attending: Obstetrics | Admitting: Obstetrics

## 2021-06-14 VITALS — BP 119/75 | HR 88 | Wt 206.0 lb

## 2021-06-14 DIAGNOSIS — Z23 Encounter for immunization: Secondary | ICD-10-CM | POA: Diagnosis not present

## 2021-06-14 DIAGNOSIS — Z3A27 27 weeks gestation of pregnancy: Secondary | ICD-10-CM | POA: Diagnosis not present

## 2021-06-14 DIAGNOSIS — N898 Other specified noninflammatory disorders of vagina: Secondary | ICD-10-CM | POA: Insufficient documentation

## 2021-06-14 DIAGNOSIS — B3731 Acute candidiasis of vulva and vagina: Secondary | ICD-10-CM

## 2021-06-14 DIAGNOSIS — B369 Superficial mycosis, unspecified: Secondary | ICD-10-CM

## 2021-06-14 DIAGNOSIS — Z3490 Encounter for supervision of normal pregnancy, unspecified, unspecified trimester: Secondary | ICD-10-CM

## 2021-06-14 DIAGNOSIS — Z113 Encounter for screening for infections with a predominantly sexual mode of transmission: Secondary | ICD-10-CM

## 2021-06-14 DIAGNOSIS — O360920 Maternal care for other rhesus isoimmunization, second trimester, not applicable or unspecified: Secondary | ICD-10-CM

## 2021-06-14 DIAGNOSIS — O99340 Other mental disorders complicating pregnancy, unspecified trimester: Secondary | ICD-10-CM

## 2021-06-14 DIAGNOSIS — R300 Dysuria: Secondary | ICD-10-CM

## 2021-06-14 DIAGNOSIS — F32A Depression, unspecified: Secondary | ICD-10-CM

## 2021-06-14 DIAGNOSIS — O99342 Other mental disorders complicating pregnancy, second trimester: Secondary | ICD-10-CM

## 2021-06-14 MED ORDER — METRONIDAZOLE 500 MG PO TABS
500.0000 mg | ORAL_TABLET | Freq: Two times a day (BID) | ORAL | 2 refills | Status: DC
Start: 2021-06-14 — End: 2021-08-20

## 2021-06-14 MED ORDER — RHO D IMMUNE GLOBULIN 1500 UNIT/2ML IJ SOSY
300.0000 ug | PREFILLED_SYRINGE | Freq: Once | INTRAMUSCULAR | Status: AC
  Administered 2021-06-14: 300 ug via INTRAMUSCULAR

## 2021-06-14 MED ORDER — CLOTRIMAZOLE 1 % EX CREA: 1.0000 "application " | TOPICAL_CREAM | Freq: Two times a day (BID) | CUTANEOUS | 0 refills | Status: DC

## 2021-06-14 MED ORDER — TERCONAZOLE 0.4 % VA CREA: 1.0000 | TOPICAL_CREAM | Freq: Every day | VAGINAL | 0 refills | Status: DC

## 2021-06-14 NOTE — Progress Notes (Addendum)
ROB/GTT/RHO.  TDAP and FLU vaccines given, tolerated well.  C/o pressure, pain and brown discharge, itching.   Administrations This Visit     rho (d) immune globulin (RHIG/RHOPHYLAC) injection 300 mcg     Admin Date 06/14/2021 Action Given Dose 300 mcg Route Intramuscular Administered By Maretta Bees, RMA

## 2021-06-14 NOTE — Progress Notes (Signed)
Subjective:  Melissa [name] is a 20 y.o. G3P1011 at [redacted]w[redacted]d being seen today for ongoing prenatal care.  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  Patient reports  pelvic pain and pressure.  Brownish vaginal discharge and vaginal itching .  Contractions: Irritability. Vag. Bleeding: None.  Movement: Present. Denies leaking of fluid.   The following portions of the patient's history were reviewed and updated as appropriate: allergies, current medications, past family history, past medical history, past social history, past surgical history and problem list. Problem list updated.  Objective:   Vitals:   06/14/21 0949  BP: 119/75  Pulse: 88  Weight: 206 lb (93.4 kg)    Fetal Status: Fetal Heart Rate (bpm): 143   Movement: Present     General:  Alert, oriented and cooperative. Patient is in no acute distress.  Skin: Skin is warm and dry. No rash noted.   Cardiovascular: Normal heart rate noted  Respiratory: Normal respiratory effort, no problems with respiration noted  Abdomen: Soft, gravid, appropriate for gestational age. Pain/Pressure: Present     Pelvic:  Cvx:  Long / closed / -3 / Breech  Extremities: Normal range of  motion.  Edema: Trace  Mental Status: Normal mood and affect. Normal behavior. Normal judgment and thought content.   Urinalysis:      Assessment and Plan:  Pregnancy: G3P1011 at [redacted]w[redacted]d  1. Encounter for supervision of low-risk pregnancy, antepartum Rx: - rho (d) immune globulin (RHIG/RHOPHYLAC) injection 300 mcg - Flu Vaccine QUAD 36+ mos IM (Fluarix, Quad PF) - Glucose Tolerance, 2 Hours w/1 Hour - CBC - Tdap vaccine greater than or equal to 7yo IM  2. Dysuria Rx: - Urine Culture  3. Vaginal discharge Rx: - Cervicovaginal ancillary only( [place]) - metroNIDAZOLE (FLAGYL) 500 MG tablet; Take 1 tablet (500 mg total) by mouth 2 (two) times daily.  Dispense: 14 tablet; Refill: 2  4. Candidal vaginitis Rx: - terconazole (TERAZOL 7) 0.4 % vaginal cream; Place 1 applicator vaginally at bedtime.  Dispense: 45 g; Refill: 0  5. Superficial fungus infection of skin Rx: - clotrimazole (LOTRIMIN) 1 % cream; Apply 1 application topically 2 (two) times daily.  Dispense: 30 g; Refill: 0  6. Screen for STD (sexually transmitted disease) Rx: - RPR - HIV antibody (with reflex)  7. Depression affecting pregnancy Rx: - Ambulatory referral to [facility]     Preterm labor symptoms and general obstetric precautions including but not limited to vaginal bleeding, contractions, leaking of fluid and fetal movement were reviewed in detail with the patient. Please refer to After Visit Summary for other counseling recommendations.   Return in about 2 weeks (around 06/28/2021) for ROB.   Brock Bad, MD  06/14/21

## 2021-06-15 ENCOUNTER — Other Ambulatory Visit: Payer: Self-pay | Admitting: Obstetrics

## 2021-06-15 DIAGNOSIS — B3731 Acute candidiasis of vulva and vagina: Secondary | ICD-10-CM

## 2021-06-15 LAB — CERVICOVAGINAL ANCILLARY ONLY
Bacterial Vaginitis (gardnerella): NEGATIVE
Candida Glabrata: NEGATIVE
Candida Vaginitis: POSITIVE — AB
Chlamydia: NEGATIVE
Comment: NEGATIVE
Comment: NEGATIVE
Comment: NEGATIVE
Comment: NEGATIVE
Comment: NEGATIVE
Comment: NORMAL
Neisseria Gonorrhea: NEGATIVE
Trichomonas: NEGATIVE

## 2021-06-15 LAB — GLUCOSE TOLERANCE, 2 HOURS W/ 1HR
Glucose, 1 hour: 83 mg/dL (ref 70–179)
Glucose, 2 hour: 81 mg/dL (ref 70–152)
Glucose, Fasting: 82 mg/dL (ref 70–91)

## 2021-06-15 LAB — CBC
Hematocrit: 32.5 % — ABNORMAL LOW (ref 34.0–46.6)
Hemoglobin: 10.8 g/dL — ABNORMAL LOW (ref 11.1–15.9)
MCH: 26.7 pg (ref 26.6–33.0)
MCHC: 33.2 g/dL (ref 31.5–35.7)
MCV: 80 fL (ref 79–97)
Platelets: 208 10*3/uL (ref 150–450)
RBC: 4.05 x10E6/uL (ref 3.77–5.28)
RDW: 12.2 % (ref 11.7–15.4)
WBC: 6.8 10*3/uL (ref 3.4–10.8)

## 2021-06-15 LAB — RPR: RPR Ser Ql: NONREACTIVE

## 2021-06-15 LAB — HIV ANTIBODY (ROUTINE TESTING W REFLEX): HIV Screen 4th Generation wRfx: NONREACTIVE

## 2021-06-15 MED ORDER — TERCONAZOLE 0.4 % VA CREA: 1.0000 | TOPICAL_CREAM | Freq: Every day | VAGINAL | 0 refills | Status: DC

## 2021-06-19 LAB — URINE CULTURE

## 2021-06-28 ENCOUNTER — Encounter: Payer: Medicaid Other | Admitting: Obstetrics and Gynecology

## 2021-06-28 ENCOUNTER — Institutional Professional Consult (permissible substitution): Payer: Medicaid Other | Admitting: Licensed Clinical Social Worker

## 2021-06-29 ENCOUNTER — Encounter: Payer: Medicaid Other | Admitting: Obstetrics & Gynecology

## 2021-07-01 ENCOUNTER — Ambulatory Visit (INDEPENDENT_AMBULATORY_CARE_PROVIDER_SITE_OTHER): Payer: Medicaid Other | Admitting: Licensed Clinical Social Worker

## 2021-07-01 DIAGNOSIS — O99340 Other mental disorders complicating pregnancy, unspecified trimester: Secondary | ICD-10-CM

## 2021-07-01 DIAGNOSIS — F39 Unspecified mood [affective] disorder: Secondary | ICD-10-CM | POA: Diagnosis not present

## 2021-07-01 DIAGNOSIS — F32A Depression, unspecified: Secondary | ICD-10-CM | POA: Diagnosis not present

## 2021-07-01 NOTE — BH Specialist Note (Signed)
Integrated Behavioral Health via Telemedicine Visit  07/01/2021 Melissa [name] [id number]  Number of Integrated Behavioral Health visits: 2 Session Start time: 9:33am  Session End time: 9:55am Total time: 22 mins via phone per pt request   Referring Provider: Aron Baba MD Patient/Family location: Home  [facility] Provider location: [facility] All persons participating in visit: Pt Melissa [name] and LCSW A. Kru Allman  Types of Service: Individual psychotherapy  I connected with Melissa [name] and/or Melissa [name]'s n/a via  Telephone or Video Enabled Telemedicine Application  (Video is Caregility application) and verified that I am speaking with the correct person using two identifiers. Discussed confidentiality: Yes   I discussed the limitations of telemedicine and the availability of in person appointments.  Discussed there is a possibility of technology failure and discussed alternative modes of communication if that failure occurs.  I discussed that engaging in this telemedicine visit, they consent to the provision of behavioral healthcare and the services will be billed under their insurance.  Patient and/or legal guardian expressed understanding and consented to Telemedicine visit: Yes   Presenting Concerns: Patient and/or family reports the following symptoms/concerns: mood instability, depression Duration of problem: over one year ; Severity of problem: mild  Patient and/or Family's Strengths/Protective Factors: Concrete supports in place (healthy food, safe environments, etc.)  Goals Addressed: Patient will:  Reduce symptoms of: depression and mood instability   Increase knowledge and/or ability of: coping skills and healthy habits   Demonstrate ability to: Increase healthy adjustment to current life circumstances  Progress towards Goals: Ongoing  Interventions: Interventions utilized:  Supportive Counseling Standardized Assessments completed: PHQ  9    Assessment: Patient currently experiencing mood disorder and depression affecting pregnancy. Melissa [name] reports she does not have a plan to harm herself or others. Melissa [name] expressed understanding of [facility]  Patient may benefit from integrated behavioral health.  Plan: Follow up with behavioral health clinician on : 2-3 weeks in person  Behavioral recommendations: communicate needs to supportive family member, prioritize rest, keep scheduled medical and behavorial health appts. LCSW A. Felton Clinton encouraged Ms. Earlene Plater to begin journal writing, walking and mindfulness.  Referral(s): Integrated [employer] (In Clinic)  I discussed the assessment and treatment plan with the patient and/or parent/guardian. They were provided an opportunity to ask questions and all were answered. They agreed with the plan and demonstrated an understanding of the instructions.   They were advised to call back or seek an in-person evaluation if the symptoms worsen or if the condition fails to improve as anticipated.  Gwyndolyn Saxon, LCSW

## 2021-07-04 ENCOUNTER — Other Ambulatory Visit: Payer: Self-pay

## 2021-07-04 ENCOUNTER — Ambulatory Visit (INDEPENDENT_AMBULATORY_CARE_PROVIDER_SITE_OTHER): Payer: Medicaid Other | Admitting: Obstetrics and Gynecology

## 2021-07-04 VITALS — BP 111/76 | HR 102 | Wt 215.2 lb

## 2021-07-04 DIAGNOSIS — Z3A30 30 weeks gestation of pregnancy: Secondary | ICD-10-CM | POA: Insufficient documentation

## 2021-07-04 DIAGNOSIS — Z3493 Encounter for supervision of normal pregnancy, unspecified, third trimester: Secondary | ICD-10-CM

## 2021-07-04 DIAGNOSIS — D573 Sickle-cell trait: Secondary | ICD-10-CM

## 2021-07-04 DIAGNOSIS — Z87898 Personal history of other specified conditions: Secondary | ICD-10-CM

## 2021-07-04 NOTE — Progress Notes (Signed)
   PRENATAL VISIT NOTE  Subjective:  Melissa [name] is a 20 y.o. G3P1011 at [redacted]w[redacted]d being seen today for ongoing prenatal care.  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  Patient doing well with no acute concerns today. She reports  multiple vague complaints including visual spots and abdominal pain .  However, blood pressure is completely normal.  Contractions: Irritability. Vag. Bleeding: None.  Movement: Present. Denies leaking of fluid.   The following portions of the patient's history were reviewed and updated as appropriate: allergies, current medications, past family history, past medical history, past social history, past surgical history and problem list. Problem list updated.  Objective:   Vitals:   07/04/21 1115  BP: 111/76  Pulse: (!) 102  Weight: 215 lb 3.2 oz (97.6 kg)    Fetal Status: Fetal Heart Rate (bpm): 133 Fundal Height: 31 cm Movement: Present     General:  Alert, oriented and cooperative. Patient is in no acute distress.  Skin: Skin is warm and dry. No rash noted.   Cardiovascular: Normal heart rate noted  Respiratory: Normal respiratory effort, no problems with  respiration noted  Abdomen: Soft, gravid, appropriate for gestational age.  Pain/Pressure: Present     Pelvic: Cervical exam deferred        Extremities: Normal range of motion.  Edema: Trace  Mental Status:  Normal mood and affect. Normal behavior. Normal judgment and thought content.   Assessment and Plan:  Pregnancy: G3P1011 at [redacted]w[redacted]d  1. [redacted] weeks gestation of pregnancy   2. History of seizures   3. Encounter for supervision of low-risk pregnancy in third trimester Continue routine care, discussed preterm labor precautions and advised increased hydration.  4. Sickle cell trait ([name])   Preterm labor symptoms and general obstetric precautions including but not limited to vaginal bleeding, contractions, leaking of fluid and fetal movement were reviewed in detail with the patient.  Please refer to After Visit Summary for other counseling recommendations.   Return in about 2 weeks (around 07/18/2021) for ROB, in person.   Mariel Aloe, MD Faculty Attending Center for [facility]

## 2021-07-04 NOTE — Progress Notes (Signed)
ROB 30.5 wks Reports UC's Q 2 min sometimes, then they go away. Reports "seeing spots". Reports HA since Saturday, has not tried tylenol. Denies dizziness. Reports RUQ pain. BP WNL.

## 2021-07-06 ENCOUNTER — Encounter: Payer: Medicaid Other | Admitting: Licensed Clinical Social Worker

## 2021-07-07 ENCOUNTER — Inpatient Hospital Stay (HOSPITAL_COMMUNITY)
Admission: AD | Admit: 2021-07-07 | Discharge: 2021-07-07 | Disposition: A | Discharge status: Home / Self Care | Payer: Medicaid Other | Attending: Obstetrics & Gynecology | Admitting: Obstetrics & Gynecology

## 2021-07-07 ENCOUNTER — Encounter (HOSPITAL_COMMUNITY): Payer: Self-pay | Admitting: Obstetrics and Gynecology

## 2021-07-07 DIAGNOSIS — M545 Low back pain, unspecified: Secondary | ICD-10-CM | POA: Insufficient documentation

## 2021-07-07 DIAGNOSIS — Z3A31 31 weeks gestation of pregnancy: Secondary | ICD-10-CM | POA: Diagnosis not present

## 2021-07-07 DIAGNOSIS — O4700 False labor before 37 completed weeks of gestation, unspecified trimester: Secondary | ICD-10-CM | POA: Diagnosis not present

## 2021-07-07 DIAGNOSIS — O26893 Other specified pregnancy related conditions, third trimester: Secondary | ICD-10-CM | POA: Insufficient documentation

## 2021-07-07 DIAGNOSIS — O4703 False labor before 37 completed weeks of gestation, third trimester: Secondary | ICD-10-CM

## 2021-07-07 HISTORY — DX: Sickle-cell trait: D57.3

## 2021-07-07 LAB — URINALYSIS, ROUTINE W REFLEX MICROSCOPIC
Bilirubin Urine: NEGATIVE
Glucose, UA: NEGATIVE mg/dL
Hgb urine dipstick: NEGATIVE
Ketones, ur: NEGATIVE mg/dL
Leukocytes,Ua: NEGATIVE
Nitrite: NEGATIVE
Protein, ur: NEGATIVE mg/dL
Specific Gravity, Urine: 1.002 — ABNORMAL LOW (ref 1.005–1.030)
pH: 7 (ref 5.0–8.0)

## 2021-07-07 LAB — FETAL FIBRONECTIN: Fetal Fibronectin: NEGATIVE

## 2021-07-07 MED ORDER — NIFEDIPINE 10 MG PO CAPS
10.0000 mg | ORAL_CAPSULE | ORAL | Status: DC | PRN
  Administered 2021-07-07 (×2): 10 mg via ORAL
  Filled 2021-07-07 (×3): qty 1

## 2021-07-07 MED ORDER — TERBUTALINE SULFATE 1 MG/ML IJ SOLN
0.2500 mg | Freq: Once | INTRAMUSCULAR | Status: DC
  Filled 2021-07-07: qty 1

## 2021-07-07 MED ORDER — OXYCODONE-ACETAMINOPHEN 5-325 MG PO TABS
1.0000 | ORAL_TABLET | Freq: Once | ORAL | Status: AC
  Administered 2021-07-07: 04:00:00 1 via ORAL
  Filled 2021-07-07: qty 1

## 2021-07-07 NOTE — MAU Provider Note (Signed)
Chief Complaint:  No chief complaint on file.   Event Date/Time   First Provider Initiated Contact with Patient 07/07/21 0232     HPI: Melissa [name] is a 20 y.o. G3P1011 at 43w1dwho presents via EMS to maternity admissions reporting painful contractions and sharp pain in lower back.. She reports good fetal movement, denies LOF, vaginal bleeding, vaginal itching/burning, urinary symptoms, h/a, dizziness, n/v, diarrhea, constipation or fever/chills.  She denies headache, visual changes or RUQ abdominal pain.  Abdominal Pain This is a new problem. The current episode started today. The problem occurs intermittently. The problem is unchanged. The quality of the pain is described as aching and cramping. The pain radiates to the back. Pertinent negatives include no fever, headaches, myalgias or nausea. Nothing relieves the symptoms. Past treatments include nothing.   RN Note: Receive 20 y/o, G3P1011, 31 1/7 weeks, pt reports lower back pain which started at midnight tonight 9/10 scale, pt denies any vaginal bleeding or leakage of fluid, pt reports + fetal movement, safety maintained  Past Medical History: Past Medical History:  Diagnosis Date   Anemia    Anxiety    Asthma    inhaler. last attack June 2019   Congenital hydronephrosis 2001   Constipation    Depression    Headache    migraines   PID (pelvic inflammatory disease)    Rh negative state in antepartum period 02/05/2019   SAB (spontaneous abortion) 08/25/2020   Seizures ([name])    last one in 2015 - on meds   Sickle cell trait ([name])    TBI (traumatic brain injury) 2006    Past obstetric history: OB History  Gravida Para Term Preterm AB Living  3 1 1   1 1   SAB IAB Ectopic Multiple Live Births        0 1    # Outcome Date GA Lbr Len/2nd Weight Sex Delivery Anes PTL Lv  3 Current           2 AB 08/17/20 [redacted]w[redacted]d    SAB     1 Term 08/20/19 [redacted]w[redacted]d 19:04 / 00:10 3249 g F Vag-Spont EPI  LIV    Past Surgical  History: Past Surgical History:  Procedure Laterality Date   APPENDECTOMY     HERNIA REPAIR     INTESTINAL MALROTATION REPAIR  2001    Family History: Family History  Problem Relation Age of Onset   Depression Mother    Stroke Mother    Obesity Mother    Post-traumatic stress disorder Mother    Anxiety disorder Mother    Hypertension Mother    Diabetes Mother    Schizophrenia Father    Kidney disease Father     Social History: Social History   Tobacco Use   Smoking status: Every Day    Types: Cigarettes, Cigars   Smokeless tobacco: Never   Tobacco comments:    black and milds, none since + UPT  Vaping Use   Vaping Use: Never used  Substance Use Topics   Alcohol use: Never   Drug use: Not Currently    Types: Marijuana    Comment: last 2020    Allergies:  Allergies  Allergen Reactions   Benadryl [Diphenhydramine Hcl] Other (See Comments)    Causes seizures    Gluten Meal Nausea And Vomiting   Zithromax [Azithromycin] Anaphylaxis and Swelling   Lactose Intolerance (Gi) Diarrhea    Meds:  Medications Prior to Admission  Medication Sig Dispense Refill Last Dose  ferrous sulfate 325 (65 FE) MG tablet Take 325 mg by mouth daily with breakfast.   Past Week   Prenatal Vit-Fe Fumarate-FA (PREPLUS) 27-1 MG TABS Take 1 tablet by mouth daily. 30 tablet 13 07/06/2021   acetaminophen (TYLENOL) 500 MG tablet Take 500 mg by mouth every 6 (six) hours as needed. (Patient not taking: Reported on 07/04/2021)   Unknown   clotrimazole (LOTRIMIN) 1 % cream Apply 1 application topically 2 (two) times daily. 30 g 0 Unknown   cyclobenzaprine (FLEXERIL) 10 MG tablet Take 1 tablet (10 mg total) by mouth 3 (three) times daily as needed for muscle spasms. 30 tablet 0 Unknown   docusate sodium (COLACE) 100 MG capsule Take 1 capsule (100 mg total) by mouth at bedtime. Start after completion of miralax. 60 capsule 0 Unknown   metroNIDAZOLE (FLAGYL) 500 MG tablet Take 1 tablet (500 mg  total) by mouth 2 (two) times daily. 14 tablet 2 Unknown   metroNIDAZOLE (METROGEL VAGINAL) 0.75 % vaginal gel Place 1 Applicatorful vaginally at bedtime. Insert one applicator, at bedtime, for 5 nights. 70 g 0 Unknown   ondansetron (ZOFRAN-ODT) 4 MG disintegrating tablet Take 1 tablet (4 mg total) by mouth every 8 (eight) hours as needed for nausea or vomiting. (Patient not taking: Reported on 07/04/2021) 15 tablet 0 Unknown   polyethylene glycol (MIRALAX) 17 g packet Take 17 g by mouth 2 (two) times daily. 14 each 0 Unknown   terconazole (TERAZOL 7) 0.4 % vaginal cream Place 1 applicator vaginally at bedtime. 45 g 0 Unknown   terconazole (TERAZOL 7) 0.4 % vaginal cream Place 1 applicator vaginally at bedtime. 45 g 0 Unknown    I have reviewed patient's Past Medical Hx, Surgical Hx, Family Hx, Social Hx, medications and allergies.   ROS:  Review of Systems  Constitutional:  Negative for fever.  Gastrointestinal:  Positive for abdominal pain. Negative for nausea.  Musculoskeletal:  Negative for myalgias.  Neurological:  Negative for headaches.  Other systems negative  Physical Exam  Patient Vitals for the past 24 hrs:  BP Temp Temp src Pulse Resp  07/07/21 0214 119/77 98.1 F (36.7 C) Oral 92 20   Constitutional: Well-developed, well-nourished female in no acute distress.  Cardiovascular: normal rate and rhythm Respiratory: normal effort, clear to auscultation bilaterally GI: Abd soft, non-tender, gravid appropriate for gestational age.   No rebound or guarding. MS: Extremities nontender, no edema, normal ROM Neurologic: Alert and oriented x 4.  GU: Neg CVAT.  PELVIC EXAM:  Cervix 1cm/long/ballotable   FHT:  Baseline 140 , moderate variability, accelerations present, no decelerations Contractions: Uterine irritability with intermittent contractions   Labs: Results for orders placed or performed during the hospital encounter of 07/07/21 (from the past 24 hour(s))  Urinalysis,  Routine w reflex microscopic     Status: Abnormal   Collection Time: 07/07/21  2:37 AM  Result Value Ref Range   Color, Urine STRAW (A) YELLOW   APPearance CLEAR CLEAR   Specific Gravity, Urine 1.002 (L) 1.005 - 1.030   pH 7.0 5.0 - 8.0   Glucose, UA NEGATIVE NEGATIVE mg/dL   Hgb urine dipstick NEGATIVE NEGATIVE   Bilirubin Urine NEGATIVE NEGATIVE   Ketones, ur NEGATIVE NEGATIVE mg/dL   Protein, ur NEGATIVE NEGATIVE mg/dL   Nitrite NEGATIVE NEGATIVE   Leukocytes,Ua NEGATIVE NEGATIVE  Fetal fibronectin     Status: None   Collection Time: 07/07/21  2:45 AM  Result Value Ref Range   Fetal Fibronectin NEGATIVE NEGATIVE  O/Negative/-- (07/07 1110)  Imaging:  No results found.  MAU Course/MDM: I have ordered labs and reviewed results. Fetal fibronectin is negative NST reviewed, reassuring  Treatments in MAU included Procardia series x 2 doses (then BP lowered)  UCs mostly stopped but irritability persisted.  Offered Terbutaline, declined  Stated most of pain was sharp pain in her back, offered Percocet x 1 tab which did relieve pain.   Assessment: Single IUP at [redacted]w[redacted]d Preterm uterine irritability with negative Fetal Fibronectin 1cm cervical dilation, could be related to multiparity  Plan: Discharge home Preterm Labor precautions and fetal kick counts Follow up in Office for prenatal visits  Encouraged to return if she develops worsening of symptoms, increase in pain, fever, or other concerning symptoms.   Pt stable at time of discharge.  Wynelle Bourgeois CNM, MSN Certified Nurse-Midwife 07/07/2021 2:32 AM

## 2021-07-07 NOTE — MAU Note (Signed)
Receive 20 y/o, G3P1011, 31 1/7 weeks, pt reports lower back pain which started at midnight tonight 9/10 scale, pt denies any vaginal bleeding or leakage of fluid, pt reports + fetal movement, safety maintained.

## 2021-07-18 ENCOUNTER — Institutional Professional Consult (permissible substitution): Payer: Medicaid Other | Admitting: Licensed Clinical Social Worker

## 2021-07-18 ENCOUNTER — Encounter: Payer: Medicaid Other | Admitting: Obstetrics and Gynecology

## 2021-07-27 ENCOUNTER — Inpatient Hospital Stay (HOSPITAL_COMMUNITY)
Admission: AD | Admit: 2021-07-27 | Discharge: 2021-07-27 | Discharge status: Against Medical Advice | Payer: Medicaid Other | Attending: Family Medicine | Admitting: Family Medicine

## 2021-07-27 ENCOUNTER — Other Ambulatory Visit: Payer: Self-pay

## 2021-07-27 ENCOUNTER — Encounter (HOSPITAL_COMMUNITY): Payer: Self-pay | Admitting: Family Medicine

## 2021-07-27 DIAGNOSIS — O4703 False labor before 37 completed weeks of gestation, third trimester: Secondary | ICD-10-CM | POA: Insufficient documentation

## 2021-07-27 DIAGNOSIS — Z3A34 34 weeks gestation of pregnancy: Secondary | ICD-10-CM

## 2021-07-27 DIAGNOSIS — Z5329 Procedure and treatment not carried out because of patient's decision for other reasons: Secondary | ICD-10-CM

## 2021-07-27 DIAGNOSIS — O368130 Decreased fetal movements, third trimester, not applicable or unspecified: Secondary | ICD-10-CM | POA: Insufficient documentation

## 2021-07-27 DIAGNOSIS — O4700 False labor before 37 completed weeks of gestation, unspecified trimester: Secondary | ICD-10-CM

## 2021-07-27 LAB — URINALYSIS, ROUTINE W REFLEX MICROSCOPIC
Bilirubin Urine: NEGATIVE
Glucose, UA: NEGATIVE mg/dL
Hgb urine dipstick: NEGATIVE
Ketones, ur: NEGATIVE mg/dL
Leukocytes,Ua: NEGATIVE
Nitrite: NEGATIVE
Protein, ur: NEGATIVE mg/dL
Specific Gravity, Urine: 1.015 (ref 1.005–1.030)
pH: 6 (ref 5.0–8.0)

## 2021-07-27 LAB — AMNISURE RUPTURE OF MEMBRANE (ROM) NOT AT ARMC: Amnisure ROM: NEGATIVE

## 2021-07-27 MED ORDER — CYCLOBENZAPRINE HCL 5 MG PO TABS
10.0000 mg | ORAL_TABLET | Freq: Once | ORAL | Status: AC
  Administered 2021-07-27: 10 mg via ORAL
  Filled 2021-07-27: qty 2

## 2021-07-27 NOTE — MAU Note (Signed)
Patient states she had a family emergency and needed to leave. AMA form signed.

## 2021-07-27 NOTE — MAU Note (Signed)
Melissa [name] is a 20 y.o. at [redacted]w[redacted]d here in MAU reporting: noticed her mucus plug came out this afternoon while using the bathroom. States also having contractions back to back. DFM since her mucus plug came out. Seeing some brown bleeding. Unsure if LOF, states her sister noticed the back of her pants were wet 2 days ago.   Onset of complaint: today  Pain score: 8/10  Vitals:   07/27/21 1555  BP: 112/74  Pulse: 93  Resp: 18  Temp: 98.5 F (36.9 C)  SpO2: 100%     FHT:143  Lab orders placed from triage: UA

## 2021-07-27 NOTE — MAU Provider Note (Signed)
History     CSN: [id number]  Arrival date and time: 07/27/21 1542   Event Date/Time   First Provider Initiated Contact with Patient 07/27/21 1607      Chief Complaint  Patient presents with   Contractions   Decreased Fetal Movement   HPI Melissa [name] is a 20 y.o. G3P1011 at [redacted]w[redacted]d who presents stating at 1300 she lost her mucous plug. She states since then, she has been contracting. She is unsure how often she is contracting. She reports it has slowed down since her arrival to MAU. She also reports that the baby isn't moving as much today. She is unsure if the baby is moving more now. She denies any bleeding or leaking.   OB History     Gravida  3   Para  1   Term  1   Preterm      AB  1   Living  1      SAB      IAB      Ectopic      Multiple  0   Live Births  1           Past Medical History:  Diagnosis Date   Anemia    Anxiety    Asthma    inhaler. last attack June 2019   Congenital hydronephrosis 2001   Constipation    Depression    Headache    migraines   PID (pelvic inflammatory disease)    Rh negative state in antepartum period 02/05/2019   SAB (spontaneous abortion) 08/25/2020   Seizures ([name])    last one in 2015 - on meds   Sickle cell trait ([name])    TBI (traumatic brain injury) 2006    Past Surgical History:  Procedure Laterality Date   APPENDECTOMY     HERNIA REPAIR     INTESTINAL MALROTATION REPAIR  2001    Family History  Problem Relation Age of Onset   Depression Mother    Stroke Mother    Obesity Mother    Post-traumatic stress disorder Mother    Anxiety disorder Mother    Hypertension Mother    Diabetes Mother    Schizophrenia Father    Kidney disease Father     Social History   Tobacco Use   Smoking status: Every Day    Types: Cigarettes, Cigars   Smokeless tobacco: Never   Tobacco comments:    black and milds, none since + UPT  Vaping Use   Vaping Use: Never used  Substance Use Topics    Alcohol use: Never   Drug use: Not Currently    Types: Marijuana    Comment: last 2020    Allergies:  Allergies  Allergen Reactions   Benadryl [Diphenhydramine Hcl] Other (See Comments)    Causes seizures    Gluten Meal Nausea And Vomiting   Zithromax [Azithromycin] Anaphylaxis and Swelling   Lactose Intolerance (Gi) Diarrhea    Medications Prior to Admission  Medication Sig Dispense Refill Last Dose   ferrous sulfate 325 (65 FE) MG tablet Take 325 mg by mouth daily with breakfast.   Past Week   Prenatal Vit-Fe Fumarate-FA (PREPLUS) 27-1 MG TABS Take 1 tablet by mouth daily. 30 tablet 13 Past Week   acetaminophen (TYLENOL) 500 MG tablet Take 500 mg by mouth every 6 (six) hours as needed. (Patient not taking: Reported on 07/04/2021)      clotrimazole (LOTRIMIN) 1 % cream Apply 1 application topically 2 (  two) times daily. 30 g 0    cyclobenzaprine (FLEXERIL) 10 MG tablet Take 1 tablet (10 mg total) by mouth 3 (three) times daily as needed for muscle spasms. 30 tablet 0    docusate sodium (COLACE) 100 MG capsule Take 1 capsule (100 mg total) by mouth at bedtime. Start after completion of miralax. 60 capsule 0    metroNIDAZOLE (FLAGYL) 500 MG tablet Take 1 tablet (500 mg total) by mouth 2 (two) times daily. 14 tablet 2    metroNIDAZOLE (METROGEL VAGINAL) 0.75 % vaginal gel Place 1 Applicatorful vaginally at bedtime. Insert one applicator, at bedtime, for 5 nights. 70 g 0    ondansetron (ZOFRAN-ODT) 4 MG disintegrating tablet Take 1 tablet (4 mg total) by mouth every 8 (eight) hours as needed for nausea or vomiting. (Patient not taking: Reported on 07/04/2021) 15 tablet 0    polyethylene glycol (MIRALAX) 17 g packet Take 17 g by mouth 2 (two) times daily. 14 each 0    terconazole (TERAZOL 7) 0.4 % vaginal cream Place 1 applicator vaginally at bedtime. 45 g 0    terconazole (TERAZOL 7) 0.4 % vaginal cream Place 1 applicator vaginally at bedtime. 45 g 0     Review of Systems   Constitutional: Negative.  Negative for fatigue and fever.  HENT: Negative.    Respiratory: Negative.  Negative for shortness of breath.   Cardiovascular: Negative.  Negative for chest pain.  Gastrointestinal:  Positive for abdominal pain. Negative for constipation, diarrhea, nausea and vomiting.  Genitourinary: Negative.  Negative for dysuria.  Neurological: Negative.  Negative for dizziness and headaches.  Physical Exam   Blood pressure 112/74, pulse 93, temperature 98.5 F (36.9 C), temperature source Oral, resp. rate 18, height 5\' 7"  (1.702 m), weight 99.6 kg, last menstrual period 12/01/2020, SpO2 100 %, unknown if currently breastfeeding.  Physical Exam Vitals and nursing note reviewed.  Constitutional:      General: She is not in acute distress.    Appearance: She is well-developed.  HENT:     Head: Normocephalic.  Eyes:     Pupils: Pupils are equal, round, and reactive to light.  Cardiovascular:     Rate and Rhythm: Normal rate and regular rhythm.     Heart sounds: Normal heart sounds.  Pulmonary:     Effort: Pulmonary effort is normal. No respiratory distress.     Breath sounds: Normal breath sounds.  Abdominal:     General: Bowel sounds are normal. There is no distension.     Palpations: Abdomen is soft.     Tenderness: There is no abdominal tenderness.  Skin:    General: Skin is warm and dry.  Neurological:     Mental Status: She is alert and oriented to person, place, and time.  Psychiatric:        Mood and Affect: Mood normal.        Behavior: Behavior normal.        Thought Content: Thought content normal.        Judgment: Judgment normal.   Fetal Tracing:  Baseline: 125 Variability: moderate Accels: 15x15 Decels: none  Toco: none   Cervix: 1/thick/posterior  MAU Course  Procedures Results for orders placed or performed during the hospital encounter of 07/27/21 (from the past 24 hour(s))  Urinalysis, Routine w reflex microscopic Urine, Clean  Catch     Status: None   Collection Time: 07/27/21  3:56 PM  Result Value Ref Range   Color, Urine YELLOW YELLOW  APPearance CLEAR CLEAR   Specific Gravity, Urine 1.015 1.005 - 1.030   pH 6.0 5.0 - 8.0   Glucose, UA NEGATIVE NEGATIVE mg/dL   Hgb urine dipstick NEGATIVE NEGATIVE   Bilirubin Urine NEGATIVE NEGATIVE   Ketones, ur NEGATIVE NEGATIVE mg/dL   Protein, ur NEGATIVE NEGATIVE mg/dL   Nitrite NEGATIVE NEGATIVE   Leukocytes,Ua NEGATIVE NEGATIVE  Amnisure rupture of membrane (rom)not at [facility]     Status: None   Collection Time: 07/27/21  4:57 PM  Result Value Ref Range   Amnisure ROM NEGATIVE     MDM UA Cervix closed- discussed procardia for contractions and patient is requesting muscle relaxer because she reports that helps more.  Flexeril PO  Patient called CNM to bedside and asked "how do you know my water isn't broke?" Patient denies any leaking of fluid. CNM provided education on signs of SROM. Patient states her water broke with her first child and she didn't know so she wants "the swab to be sure."  Amnisure  After collection of amnisure, patient left AMA stating she has a family emergency  Assessment and Plan   1. Preterm uterine contractions   2. [redacted] weeks gestation of pregnancy   3. Left against medical advice    -Patient left AMA without results  Rolm Bookbinder 07/27/2021, 4:08 PM

## 2021-08-01 ENCOUNTER — Encounter: Payer: Medicaid Other | Admitting: Licensed Clinical Social Worker

## 2021-08-01 ENCOUNTER — Telehealth: Payer: Self-pay | Admitting: Licensed Clinical Social Worker

## 2021-08-01 NOTE — Telephone Encounter (Signed)
Called pt regarding scheduled mychart visit left message for callback

## 2021-08-09 ENCOUNTER — Other Ambulatory Visit: Payer: Self-pay

## 2021-08-09 ENCOUNTER — Encounter: Payer: Self-pay | Admitting: Family Medicine

## 2021-08-09 ENCOUNTER — Ambulatory Visit (INDEPENDENT_AMBULATORY_CARE_PROVIDER_SITE_OTHER): Payer: Medicaid Other | Admitting: Family Medicine

## 2021-08-09 ENCOUNTER — Other Ambulatory Visit (HOSPITAL_COMMUNITY)
Admission: RE | Admit: 2021-08-09 | Discharge: 2021-08-09 | Disposition: A | Discharge status: Home / Self Care | Payer: Medicaid Other | Source: Ambulatory Visit | Attending: Family Medicine | Admitting: Family Medicine

## 2021-08-09 VITALS — BP 124/78 | HR 99 | Wt 225.0 lb

## 2021-08-09 DIAGNOSIS — D573 Sickle-cell trait: Secondary | ICD-10-CM

## 2021-08-09 DIAGNOSIS — Z87898 Personal history of other specified conditions: Secondary | ICD-10-CM

## 2021-08-09 DIAGNOSIS — Z3A35 35 weeks gestation of pregnancy: Secondary | ICD-10-CM

## 2021-08-09 DIAGNOSIS — Z3493 Encounter for supervision of normal pregnancy, unspecified, third trimester: Secondary | ICD-10-CM

## 2021-08-09 DIAGNOSIS — B009 Herpesviral infection, unspecified: Secondary | ICD-10-CM

## 2021-08-09 DIAGNOSIS — O99340 Other mental disorders complicating pregnancy, unspecified trimester: Secondary | ICD-10-CM

## 2021-08-09 DIAGNOSIS — F32A Depression, unspecified: Secondary | ICD-10-CM

## 2021-08-09 LAB — OB RESULTS CONSOLE GC/CHLAMYDIA: Gonorrhea: NEGATIVE

## 2021-08-09 MED ORDER — VALACYCLOVIR HCL 1 G PO TABS: 1000.0000 mg | ORAL_TABLET | Freq: Two times a day (BID) | ORAL | 0 refills | Status: AC

## 2021-08-09 NOTE — Progress Notes (Signed)
Pt presents for ROB, GBS, and GC/CT.  Requests cx check today.  PHQ9= 4 GAD7= 9 - pt agrees to therapy

## 2021-08-09 NOTE — Patient Instructions (Signed)
Start valtrex twice daily.

## 2021-08-10 LAB — CERVICOVAGINAL ANCILLARY ONLY
Bacterial Vaginitis (gardnerella): NEGATIVE
Candida Glabrata: NEGATIVE
Candida Vaginitis: POSITIVE — AB
Chlamydia: NEGATIVE
Comment: NEGATIVE
Comment: NEGATIVE
Comment: NEGATIVE
Comment: NEGATIVE
Comment: NEGATIVE
Comment: NORMAL
Neisseria Gonorrhea: NEGATIVE
Trichomonas: NEGATIVE

## 2021-08-11 ENCOUNTER — Encounter: Payer: Self-pay | Admitting: Family Medicine

## 2021-08-11 DIAGNOSIS — B009 Herpesviral infection, unspecified: Secondary | ICD-10-CM | POA: Insufficient documentation

## 2021-08-11 HISTORY — DX: Herpesviral infection, unspecified: B00.9

## 2021-08-11 NOTE — Progress Notes (Signed)
° ° °  Subjective:  Melissa [name] is a 20 y.o. G3P1011 at [redacted]w[redacted]d being seen today for ongoing prenatal care.  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  Patient reports a vaginal rash that has been present for about two weeks and has been extremely uncomfortable. Tender to the touch. States they initially felt like bumps.  Contractions: Irritability. Vag. Bleeding: None.  Movement: Present. Denies leaking of fluid.   The following portions of the patient's history were reviewed and updated as appropriate: allergies, current medications, past family history, past medical history, past social history, past surgical history and problem list.   Objective:   Vitals:   08/09/21 1348  BP: 124/78  Pulse: 99  Weight: 225 lb (102.1 kg)    Fetal Status: Fetal Heart Rate (bpm): 131 Fundal Height: 36 cm Movement: Present   Vertex by BSUS.   General:  Alert, oriented and cooperative. Patient is in no acute distress.  Skin: Skin is warm and dry. No rash noted.   Cardiovascular: Normal heart rate noted  Respiratory: Normal respiratory effort, no problems with respiration noted  Abdomen: Soft, gravid, appropriate for gestational age. Pain/Pressure: Present     Pelvic:  Cervical exam deferred. Gc/ch/GBS collected with chaperone present.  Two small erythematous ulcers with clear fluid present around left lateral perineum, exquisitely tender to palpation. See picture  below.        Extremities: Normal range of motion.  Edema: Trace  Mental Status: Normal mood and affect. Normal behavior. Normal judgment and thought content.       Assessment and Plan:  Pregnancy: G3P1011 at [redacted]w[redacted]d  1. Encounter for supervision of low-risk pregnancy in third trimester Doing well with normal fetal movement. See problem #6.   2. [redacted] weeks gestation of pregnancy - Culture, beta strep (group b only) - Cervicovaginal ancillary only( [place])  3. Sickle cell trait ([name]) Declined partner testing.   4. History of seizures No current medication.   5. Depression affecting pregnancy - Ambulatory referral to [facility]  6. 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 Donavan Foil, overall very concerning for primary genital HSV outbreak (no previous history of this). Collected HSV culture. Sent in empiric treatment while awaiting results. Discussed likelihood of primary C/S as she is advanced in her third trimester. She was extremely tearful and would like to wait for culture/response to treatment. Will need to continue discussion and likely continue valtrex after initial tx.   - valACYclovir (VALTREX) 1000 MG tablet; Take 1 tablet (1,000 mg total) by mouth 2 (two) times daily for 10 days.  Dispense: 20 tablet; Refill: 0  Preterm labor symptoms and general obstetric precautions including but not limited to vaginal bleeding, contractions, leaking of fluid and fetal movement were reviewed in detail with the patient. Please refer to After Visit Summary for other counseling recommendations.   Return in about 1 week (around 08/16/2021) for HROB.   Allayne Stack, DO

## 2021-08-12 LAB — HERPES SIMPLEX VIRUS CULTURE

## 2021-08-13 LAB — CULTURE, BETA STREP (GROUP B ONLY): Strep Gp B Culture: NEGATIVE

## 2021-08-18 ENCOUNTER — Other Ambulatory Visit: Payer: Self-pay

## 2021-08-18 ENCOUNTER — Institutional Professional Consult (permissible substitution): Payer: Medicaid Other | Admitting: Licensed Clinical Social Worker

## 2021-08-18 ENCOUNTER — Ambulatory Visit (INDEPENDENT_AMBULATORY_CARE_PROVIDER_SITE_OTHER): Payer: Medicaid Other | Admitting: Obstetrics and Gynecology

## 2021-08-18 VITALS — BP 110/75 | HR 108 | Wt 225.0 lb

## 2021-08-18 DIAGNOSIS — R569 Unspecified convulsions: Secondary | ICD-10-CM

## 2021-08-18 DIAGNOSIS — B009 Herpesviral infection, unspecified: Secondary | ICD-10-CM

## 2021-08-18 DIAGNOSIS — Z3A37 37 weeks gestation of pregnancy: Secondary | ICD-10-CM | POA: Insufficient documentation

## 2021-08-18 DIAGNOSIS — Z3493 Encounter for supervision of normal pregnancy, unspecified, third trimester: Secondary | ICD-10-CM

## 2021-08-18 NOTE — Progress Notes (Signed)
+   Fetal movement. Pt has thought about CSection. Would like to discuss further and that she is leaning toward that decision.

## 2021-08-18 NOTE — Progress Notes (Signed)
° °  PRENATAL VISIT NOTE  Subjective:  Melissa [name] is a 20 y.o. G3P1011 at [redacted]w[redacted]d being seen today for ongoing prenatal care.  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  Patient doing well with no acute concerns today. She reports no complaints.  Contractions: Irritability. Vag. Bleeding: None.  Movement: Present. Denies leaking of fluid.   Pt states genital lesion is improving but still present.  She continues with oral valtrex.  The following portions of the patient's history were reviewed and updated as appropriate: allergies, current medications, past family history, past medical history, past social history, past surgical history and problem list. Problem list updated.  Objective:   Vitals:   08/18/21 1337  BP: 110/75  Pulse: (!) 108  Weight: 225 lb (102.1 kg)    Fetal Status: Fetal Heart Rate (bpm): 135   Movement: Present     General:  Alert, oriented and cooperative. Patient is in no acute distress.  Skin: Skin is warm and dry. No rash noted.   Cardiovascular: Normal heart rate noted  Respiratory: Normal respiratory effort, no problems with respiration noted  Abdomen: Soft, gravid, appropriate for gestational age.  Pain/Pressure: Present     Pelvic: Cervical exam deferred        Extremities: Normal range of motion.  Edema: Trace  Mental Status:  Normal mood and affect.  Normal behavior. Normal judgment and thought content.   Assessment and Plan:  Pregnancy: G3P1011 at [redacted]w[redacted]d  1. [redacted] weeks gestation of pregnancy   2. Encounter for supervision of low-risk pregnancy in third trimester Continue routine care  3. HSV infection Pt would prefer primary LTCS for safety of fetus.  Will schedule priamry c section at 39 weeks, but will also reach out to MFM for further discussion.  4. Seizures ([name]) No current issues.  Term labor symptoms and general obstetric precautions including but not limited to vaginal bleeding, contractions, leaking of fluid and fetal movement were reviewed in detail with the patient.  Please refer to After Visit Summary for other counseling recommendations.   Return in about 1 week (around 08/25/2021) for ROB, in person.   Mariel Aloe, MD Faculty Attending [facility]

## 2021-08-19 ENCOUNTER — Other Ambulatory Visit: Payer: Self-pay | Admitting: Obstetrics and Gynecology

## 2021-08-19 DIAGNOSIS — Z3A37 37 weeks gestation of pregnancy: Secondary | ICD-10-CM

## 2021-08-20 ENCOUNTER — Encounter (HOSPITAL_COMMUNITY): Payer: Self-pay | Admitting: Obstetrics & Gynecology

## 2021-08-20 ENCOUNTER — Inpatient Hospital Stay (HOSPITAL_COMMUNITY)
Admission: AD | Admit: 2021-08-20 | Discharge: 2021-08-20 | Disposition: A | Discharge status: Home / Self Care | Payer: Medicaid Other | Attending: Obstetrics & Gynecology | Admitting: Obstetrics & Gynecology

## 2021-08-20 ENCOUNTER — Other Ambulatory Visit: Payer: Self-pay

## 2021-08-20 DIAGNOSIS — Z3A37 37 weeks gestation of pregnancy: Secondary | ICD-10-CM | POA: Insufficient documentation

## 2021-08-20 DIAGNOSIS — U071 COVID-19: Secondary | ICD-10-CM | POA: Diagnosis not present

## 2021-08-20 DIAGNOSIS — O98513 Other viral diseases complicating pregnancy, third trimester: Secondary | ICD-10-CM

## 2021-08-20 MED ORDER — ACETAMINOPHEN 500 MG PO TABS
1000.0000 mg | ORAL_TABLET | Freq: Once | ORAL | Status: AC
  Administered 2021-08-20: 1000 mg via ORAL
  Filled 2021-08-20: qty 2

## 2021-08-20 MED ORDER — BENZONATATE 100 MG PO CAPS
200.0000 mg | ORAL_CAPSULE | Freq: Once | ORAL | Status: AC
  Administered 2021-08-20: 200 mg via ORAL
  Filled 2021-08-20: qty 2

## 2021-08-20 NOTE — MAU Provider Note (Signed)
History     CSN: [id number]  Arrival date and time: 08/20/21 1933   Event Date/Time   First Provider Initiated Contact with Patient 08/20/21 2020      Chief Complaint  Patient presents with   Decreased Fetal Movement   Fever   HPI Melissa [name] is a 20 y.o. G3P1011 at [redacted]w[redacted]d who presents with covid symptoms & decreased fetal movement.  Symptoms started 3 days ago. Reports her & her mother had positive covid tests this morning. Symptoms include cough, abdominal cramping, & headache. Took 325 mg of tylenol this afternoon. Has not taken other medications for her symptoms. Rates aching & headache as 5/10. Reports abdominal pain that is worse when she switched positions but also has some intermittent abdominal cramping that occurs a few times per hour. Had temp of Denies LOF or vaginal bleeding.  Reports decreased fetal movement his afternoon. Movement increased upon arrival to MAU.   OB History     Gravida  3   Para  1   Term  1   Preterm      AB  1   Living  1      SAB      IAB      Ectopic      Multiple  0   Live Births  1           Past Medical History:  Diagnosis Date   Anemia    Anxiety    Asthma    inhaler. last attack June 2019   Congenital hydronephrosis 2001   Constipation    Depression    Episodic tension-type headache, not intractable 04/16/2015   Low back strain, sequela 11/10/2020   Migraine without aura and without status migrainosus, not intractable 04/16/2015   PID (pelvic inflammatory disease)    Rh negative state in antepartum period 02/05/2019   Seizures ([name])    last one in 2015 - on meds   Sickle cell trait ([name])    TBI (traumatic brain injury) 2006    Past Surgical History:  Procedure Laterality Date   APPENDECTOMY     HERNIA REPAIR     INTESTINAL MALROTATION REPAIR  2001    Family History  Problem Relation Age of Onset   Depression Mother    Stroke Mother    Obesity Mother    Post-traumatic stress  disorder Mother    Anxiety disorder Mother    Hypertension Mother    Diabetes Mother    Schizophrenia Father    Kidney disease Father     Social History   Tobacco Use   Smoking status: Every Day    Types: Cigarettes, Cigars   Smokeless tobacco: Never   Tobacco comments:    black and milds, none since + UPT  Vaping Use   Vaping Use: Never used  Substance Use Topics   Alcohol use: Never   Drug use: Not Currently    Types: Marijuana    Comment: last 2020    Allergies:  Allergies  Allergen Reactions   Benadryl [Diphenhydramine Hcl] Other (See Comments)    Causes seizures    Gluten Meal Nausea And Vomiting   Zithromax [Azithromycin] Anaphylaxis and Swelling   Lactose Intolerance (Gi) Diarrhea    No medications prior to admission.    Review of Systems  Constitutional:  Positive for chills and fever.  HENT:  Positive for congestion. Negative for sore throat.   Respiratory:  Positive for cough.   Gastrointestinal:  Positive for  abdominal pain. Negative for diarrhea, nausea and vomiting.  Genitourinary: Negative.   Musculoskeletal:  Positive for myalgias.  Neurological:  Positive for headaches.  Physical Exam   Blood pressure 111/83, pulse (!) 102, temperature 98.6 F (37 C), temperature source Oral, resp. rate 20, last menstrual period 12/01/2020, SpO2 100 %, unknown if currently breastfeeding.  Physical Exam Vitals and nursing note reviewed. Exam conducted with a chaperone present.  Constitutional:      General: She is not in acute distress.    Appearance: Normal appearance. She is not ill-appearing or toxic-appearing.  HENT:     Head: Normocephalic and atraumatic.  Eyes:     General: No scleral icterus.    Conjunctiva/sclera: Conjunctivae normal.     Pupils: Pupils are equal, round, and reactive to light.  Cardiovascular:     Rate and Rhythm: Regular rhythm. Tachycardia present.     Heart sounds: Normal heart sounds.  Pulmonary:     Effort: Pulmonary  effort is normal. No respiratory distress.     Breath sounds: Normal breath sounds. No wheezing.  Abdominal:     Palpations: Abdomen is soft.     Tenderness: There is no abdominal tenderness.  Genitourinary:    Comments: Dilation: 2.5 Effacement (%): Thick Cervical Position: Posterior Station: Ballotable Presentation: Vertex Exam by:: Judeth Horn NP  Skin:    General: Skin is warm and dry.  Neurological:     General: No focal deficit present.     Mental Status: She is alert.  Psychiatric:        Mood and Affect: Mood normal.        Behavior: Behavior normal.   NST:  Baseline: 135 bpm, Variability: Good {> 6 bpm), Accelerations: Reactive, and Decelerations: Absent  MAU Course  Procedures No results found for this or any previous visit (from the past 24 hour(s)).  MDM Patient with positive home covid test. She is afebrile in MAU with stable vitals. Given tylenol & tessalon for symptoms. Reports improvement in symptoms & ready to be discharged home. Discussed symptomatic treatment & reasons to return.   Return of normal fetal movement upon arrival to MAU with reactive NST  Assessment and Plan   1. COVID-19 affecting pregnancy in third trimester   2. [redacted] weeks gestation of pregnancy    -tylenol prn -given list of OTC meds safe in pregnancy -quarantine -reviewed reasons to return to MAU  Judeth Horn 08/20/2021, 9:49 PM

## 2021-08-20 NOTE — MAU Note (Signed)
Pt reports she has not been feeling well for a few days. Took covid test and it was positive. Stated she has had a fever and nasal congestion. Has not felt baby move much. (Assesses FHR 151)and pt stated she felt baby kick.

## 2021-08-20 NOTE — Discharge Instructions (Signed)
Safe Medications in Pregnancy   Acne: Benzoyl Peroxide Salicylic Acid  Backache/Headache: Tylenol: 2 regular strength every 4 hours OR              2 Extra strength every 6 hours  Colds/Coughs/Allergies: Benadryl (alcohol free) 25 mg every 6 hours as needed Breath right strips Claritin Cepacol throat lozenges Chloraseptic throat spray Cold-Eeze- up to three times per day Cough drops, alcohol free Flonase (by prescription only) Guaifenesin Mucinex Robitussin DM (plain only, alcohol free) Saline nasal spray/drops Sudafed (pseudoephedrine) & Actifed ** use only after [redacted] weeks gestation and if you do not have high blood pressure Tylenol Vicks Vaporub Zinc lozenges Zyrtec   Constipation: Colace Ducolax suppositories Fleet enema Glycerin suppositories Metamucil Milk of magnesia Miralax Senokot Smooth move tea  Diarrhea: Kaopectate Imodium A-D  *NO pepto Bismol  Hemorrhoids: Anusol Anusol HC Preparation H Tucks  Indigestion: Tums Maalox Mylanta Zantac  Pepcid  Insomnia: Benadryl (alcohol free) 25mg every 6 hours as needed Tylenol PM Unisom, no Gelcaps  Leg Cramps: Tums MagGel  Nausea/Vomiting:  Bonine Dramamine Emetrol Ginger extract Sea bands Meclizine  Nausea medication to take during pregnancy:  Unisom (doxylamine succinate 25 mg tablets) Take one tablet daily at bedtime. If symptoms are not adequately controlled, the dose can be increased to a maximum recommended dose of two tablets daily (1/2 tablet in the morning, 1/2 tablet mid-afternoon and one at bedtime). Vitamin B6 100mg tablets. Take one tablet twice a day (up to 200 mg per day).  Skin Rashes: Aveeno products Benadryl cream or 25mg every 6 hours as needed Calamine Lotion 1% cortisone cream  Yeast infection: Gyne-lotrimin 7 Monistat 7  Gum/tooth pain: Anbesol  **If taking multiple medications, please check labels to avoid duplicating the same active ingredients **take  medication as directed on the label ** Do not exceed 4000 mg of tylenol in 24 hours **Do not take medications that contain aspirin or ibuprofen    

## 2021-08-24 ENCOUNTER — Encounter: Payer: Self-pay | Admitting: *Deleted

## 2021-08-24 ENCOUNTER — Encounter (HOSPITAL_COMMUNITY): Payer: Self-pay | Admitting: Obstetrics and Gynecology

## 2021-08-24 ENCOUNTER — Telehealth (HOSPITAL_COMMUNITY): Payer: Self-pay | Admitting: *Deleted

## 2021-08-24 ENCOUNTER — Telehealth: Payer: Self-pay | Admitting: *Deleted

## 2021-08-24 ENCOUNTER — Encounter: Payer: Medicaid Other | Admitting: Obstetrics and Gynecology

## 2021-08-24 NOTE — Telephone Encounter (Signed)
Preadmission screen  

## 2021-08-24 NOTE — Telephone Encounter (Signed)
TC to patient to check in after missed visit. Recent Covid, 38 wks, HSV and plan for C/S. Left message stating concern for patient and requesting call back. MyChart message also sent.

## 2021-08-24 NOTE — Patient Instructions (Addendum)
Dechelle Attaway  08/24/2021   Your procedure is scheduled on:  08/31/2021  Arrive at 0730 at Entrance C on [address] at [facility]  and [address]. You are invited to use the FREE valet parking or use the Visitor's parking deck.  Pick up the phone at the desk and dial [phone].  Call this number if you have problems the morning of surgery: [phone]  Remember:   Do not eat food:(After Midnight) Desps de medianoche.  Do not drink clear liquids: (After Midnight) Desps de medianoche.  Take these medicines the morning of surgery with A SIP OF WATER:  Take valtrex as prescribed   Do not wear jewelry, make-up or nail polish.  Do not wear lotions, powders, or perfumes. Do not wear deodorant.  Do not shave 48 hours prior to surgery.  Do not bring valuables to the hospital.  [facility] is not   responsible for any belongings or valuables brought to the hospital.  Contacts, dentures or bridgework may not be worn into surgery.  Leave suitcase in the car. After surgery it may be brought to your room.  For patients admitted to the hospital, checkout time is 11:00 AM the day of              discharge.      Please read over the following fact sheets that you were given:     Preparing for Surgery

## 2021-08-25 ENCOUNTER — Encounter (HOSPITAL_COMMUNITY): Payer: Self-pay

## 2021-08-25 ENCOUNTER — Telehealth (HOSPITAL_COMMUNITY): Payer: Self-pay | Admitting: *Deleted

## 2021-08-25 ENCOUNTER — Telehealth (INDEPENDENT_AMBULATORY_CARE_PROVIDER_SITE_OTHER): Payer: Medicaid Other

## 2021-08-25 ENCOUNTER — Other Ambulatory Visit: Payer: Self-pay

## 2021-08-25 ENCOUNTER — Telehealth: Payer: Self-pay | Admitting: *Deleted

## 2021-08-25 DIAGNOSIS — O98313 Other infections with a predominantly sexual mode of transmission complicating pregnancy, third trimester: Secondary | ICD-10-CM

## 2021-08-25 DIAGNOSIS — U071 COVID-19: Secondary | ICD-10-CM

## 2021-08-25 DIAGNOSIS — Z3A38 38 weeks gestation of pregnancy: Secondary | ICD-10-CM

## 2021-08-25 DIAGNOSIS — Z3493 Encounter for supervision of normal pregnancy, unspecified, third trimester: Secondary | ICD-10-CM

## 2021-08-25 DIAGNOSIS — O98513 Other viral diseases complicating pregnancy, third trimester: Secondary | ICD-10-CM

## 2021-08-25 DIAGNOSIS — B009 Herpesviral infection, unspecified: Secondary | ICD-10-CM

## 2021-08-25 NOTE — Telephone Encounter (Signed)
Preadmission screen  

## 2021-08-25 NOTE — Telephone Encounter (Signed)
TC to complete nurse portion E-Visit. No answer. Left VM.

## 2021-08-25 NOTE — Progress Notes (Signed)
OBSTETRICS PRENATAL VIRTUAL VISIT ENCOUNTER NOTE  [facility] location: [facility]   Patient location: Home  I connected with Melissa [name] on 08/25/21 at  2:10 PM EST by MyChart Video Encounter and verified that I am speaking with the correct person using two identifiers. I discussed the limitations, risks, security and privacy concerns of performing an evaluation and management service virtually and the availability of in person appointments. I also discussed with the patient that there may be a patient responsible charge related to this service. The patient expressed understanding and agreed to proceed. Subjective:  Melissa [name] is a 21 y.o. G3P1011 at [redacted]w[redacted]d being seen today for ongoing prenatal care.  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  Patient reports fatigue.  She was recently diagnosed with Covid and contributes various symptoms to this.  She was also recently diagnosed with HSV and taking her Valtrex daily. She reports some Contractions: Irritability. Vag. Bleeding: None.  Movement: Present. Denies any leaking of fluid.   The following portions of the patient's history were reviewed and updated as appropriate: allergies, current medications, past family history, past medical history, past social history, past surgical history and problem  list.   Objective:  There were no vitals filed for this visit.  Fetal Status:     Movement: Present     General:  Alert, oriented and cooperative. Patient is in no acute distress.  Respiratory: Normal respiratory effort, no problems with respiration noted  Mental Status: Normal mood and affect. Normal behavior. Normal judgment and thought content.  Rest of physical exam deferred due to type of encounter  Imaging: No results found.  Assessment and Plan:  Pregnancy: G3P1011 at [redacted]w[redacted]d 1. Encounter for supervision of low-risk pregnancy in third trimester -Anticipatory guidance for upcoming appts. -Patient to schedule next appt one week c/s for incision check.   2. HSV infection -Aware of scheduling for primary c/s d/t initial outbreak. -Informed that admission office attempting to contact her to complete all preadmission work.   3. COVID-19 affecting pregnancy in third trimester -Okay overall. -Reports some occasional SOB.  -Taking tylenol for aches and pains.   Preterm labor symptoms and general obstetric precautions including but not limited to vaginal bleeding, contractions, leaking of fluid and fetal movement were reviewed in detail with the patient. I discussed the assessment and treatment plan with the patient. The patient was provided an opportunity to ask questions and all were answered. The patient agreed with the plan and demonstrated an understanding of the instructions. The patient was advised to call back or seek an in-person office evaluation/go to MAU at [facility] for any urgent or concerning symptoms. Please refer to After Visit Summary for other counseling recommendations.   I provided 9 minutes of face-to-face time during this encounter.  No follow-ups on file.  Future Appointments  Date Time Provider Department Center  08/29/2021 10:00 AM [facility] PAT 1 [facility] None  09/05/2021  1:50 PM Leftwich-Kirby, Wilmer Floor, CNM [facility] None  Maryann Conners,  [place] [facility] [employer], [place]

## 2021-08-26 ENCOUNTER — Encounter (HOSPITAL_COMMUNITY): Payer: Self-pay

## 2021-08-29 ENCOUNTER — Other Ambulatory Visit (HOSPITAL_COMMUNITY)
Admission: RE | Admit: 2021-08-29 | Discharge: 2021-08-29 | Disposition: A | Discharge status: Home / Self Care | Payer: Medicaid Other | Source: Ambulatory Visit | Attending: Obstetrics and Gynecology | Admitting: Obstetrics and Gynecology

## 2021-08-29 HISTORY — DX: Family history of other specified conditions: Z84.89

## 2021-08-30 ENCOUNTER — Encounter: Payer: Medicaid Other | Admitting: Obstetrics and Gynecology

## 2021-08-30 NOTE — Anesthesia Preprocedure Evaluation (Addendum)
Anesthesia Evaluation  Patient identified by MRN, date of birth, ID band Patient awake    Reviewed: Allergy & Precautions, NPO status , Patient's Chart, lab work & pertinent test results  Airway Mallampati: II  TM Distance: >3 FB Neck ROM: Full    Dental no notable dental hx. (+) Teeth Intact, Dental Advisory Given   Pulmonary asthma , Current SmokerPatient did not abstain from smoking.,  Recent covid   Pulmonary exam normal breath sounds clear to auscultation       Cardiovascular Exercise Tolerance: Good Normal cardiovascular exam Rhythm:Regular Rate:Normal     Neuro/Psych  Headaches, Seizures -, Well Controlled,  PSYCHIATRIC DISORDERS Depression    GI/Hepatic Neg liver ROS, GERD  ,  Endo/Other    Renal/GU      Musculoskeletal   Abdominal (+) + obese (BMI 35.1),   Peds  Hematology  (+) anemia , Lab Results      Component                Value               Date                      WBC                      8.3                 08/31/2021                HGB                      9.6 (L)             08/31/2021                HCT                      29.9 (L)            08/31/2021                MCV                      73.1 (L)            08/31/2021                PLT                      202                 08/31/2021              Anesthesia Other Findings   Reproductive/Obstetrics (+) Pregnancy                            Anesthesia Physical Anesthesia Plan  ASA: 2  Anesthesia Plan: Spinal   Post-op Pain Management: Toradol IV (intra-op)   Induction:   PONV Risk Score and Plan: 1 and Treatment may vary due to age or medical condition and Ondansetron  Airway Management Planned: Natural Airway and Simple Face Mask  Additional Equipment: None  Intra-op Plan:   Post-operative Plan:   Informed Consent: I have reviewed the patients History and Physical, chart, labs and discussed  the procedure including the risks, benefits and alternatives for the proposed  anesthesia with the patient or authorized representative who has indicated his/her understanding and acceptance.     Dental advisory given  Plan Discussed with: CRNA and Anesthesiologist  Anesthesia Plan Comments:        Anesthesia Quick Evaluation

## 2021-08-30 NOTE — H&P (Addendum)
OBSTETRIC ADMISSION HISTORY AND PHYSICAL  Melissa [name] is a 21 y.o. female G33P1011 with IUP at [redacted]w[redacted]d by LMP presenting for  schedule cesarean section for HSV infection at [facility]. She reports +FMs, No LOF, no VB, no blurry vision, headaches or peripheral edema, and RUQ pain.  She plans on breast feeding. She request postpartum IUD for birth control. 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 last attack June 2019   Congenital hydronephrosis 2001   Constipation    Depression    Episodic tension-type headache, not intractable 04/16/2015   Family history of adverse reaction to anesthesia    mom rushed to hospital from Dentist office   Low back strain, sequela 11/10/2020   Migraine without aura and without status migrainosus, not intractable 04/16/2015   PID (pelvic inflammatory disease)    Rh negative state in antepartum period 02/05/2019   Seizures ([place])    last one in 2015 - on meds   Sickle cell trait ([name])    TBI (traumatic brain injury) 2006    Past Surgical History: Past Surgical History:  Procedure Laterality Date   APPENDECTOMY     HERNIA REPAIR     INTESTINAL MALROTATION REPAIR  2001    Obstetrical History: OB History     Gravida  3   Para  1   Term  1   Preterm      AB  1   Living  1      SAB      IAB      Ectopic      Multiple  0   Live Births  1           Social History Social History   Socioeconomic History   Marital status: Single    Spouse name: Not on file   Number of children: Not on file   Years of education: Not on  file   Highest education level: Not on file  Occupational History   Not on file  Tobacco Use   Smoking status: Every Day    Types: Cigarettes, Cigars   Smokeless tobacco: Never   Tobacco comments:    black and milds, none since + UPT  Vaping Use   Vaping Use: Never used  Substance and Sexual Activity   Alcohol use: Never   Drug use: Not Currently    Types: Marijuana    Comment: last 2020   Sexual activity: Yes    Birth control/protection: None  Other Topics Concern   Not on file  Social History Narrative   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   She attended [employer].    She lives with her parents, siblings, and grandfather.    She enjoys writing, singing, dancing, cooking, and shopping.   She attends [employer].   Social Determinants of Health   Financial Resource Strain: Not on file  Food Insecurity: Not on file  Transportation Needs: Not on file  Physical Activity: Not on file  Stress: Not on file  Social Connections: Not on file  Family History: Family History  Problem Relation Age of Onset   Depression Mother    Stroke Mother    Obesity Mother    Post-traumatic stress disorder Mother    Anxiety disorder Mother    Hypertension Mother    Diabetes Mother    Schizophrenia Father    Kidney disease Father     Allergies: Allergies  Allergen Reactions   Benadryl [Diphenhydramine Hcl] Other (See Comments)    Causes seizures    Gluten Meal Nausea And Vomiting   Shellfish Allergy Anaphylaxis    Patient states her throat gets tight   Zithromax [Azithromycin] Anaphylaxis and Swelling   Lactose Intolerance (Gi) Diarrhea    Medications Prior to Admission  Medication Sig Dispense Refill Last Dose   clotrimazole (LOTRIMIN) 1 % cream Apply 1 application topically 2 (two) times daily. (Patient taking differently: Apply 1 application topically daily as needed (yeast infection).) 30 g 0    docusate sodium (COLACE) 100 MG capsule Take 1 capsule (100 mg  total) by mouth at bedtime. Start after completion of miralax. (Patient taking differently: Take 100 mg by mouth daily as needed for mild constipation or moderate constipation. Start after completion of miralax.) 60 capsule 0    ferrous sulfate 325 (65 FE) MG tablet Take 325 mg by mouth daily with breakfast.      polyethylene glycol (MIRALAX / GLYCOLAX) 17 g packet Take 17 g by mouth daily as needed for moderate constipation or mild constipation.      Prenatal Vit-Fe Fumarate-FA (PREPLUS) 27-1 MG TABS Take 1 tablet by mouth daily. 30 tablet 13    valACYclovir (VALTREX) 1000 MG tablet Take 1,000 mg by mouth 2 (two) times daily.      acetaminophen (TYLENOL) 325 MG tablet Take 650 mg by mouth every 6 (six) hours as needed.        Review of Systems   All systems reviewed and negative except as stated in HPI  Blood pressure 106/66, pulse 88, height 5\' 7"  (1.702 m), weight 101.6 kg, last menstrual period 12/01/2020, unknown if currently breastfeeding. General appearance: alert, cooperative, and appears stated age Lungs: clear to auscultation bilaterally Heart: regular rate and rhythm Abdomen: soft, non-tender; bowel sounds normal Pelvic: 5 cm dilated per RN Extremities: Homans sign is negative, no sign of DVT  Presentation: cephalic Fetal monitoring: 150, moderate variability, no apparent decels Uterine activity: contractions every 5-10 min Dilation: 5 Effacement (%): 60, 70 Exam by:: Lenox Ponds RN   Prenatal labs: ABO, Rh: --/--/PENDING (01/11 [id number]) Antibody: PENDING (01/11 0849) Rubella: 6.97 (07/07 1110) RPR: Non Reactive (10/25 0932)  HBsAg: Negative (07/07 1110)  HIV: Non Reactive (10/25 0932)  GBS: Negative/-- (12/20 1436)  2 hr Glucola wnl Genetic screening  normal Anatomy US caliectasis  Prenatal Transfer Tool  Maternal Diabetes: No Genetic Screening: Normal Maternal Ultrasounds/Referrals: caliectasis resolved on f/u Fetal Ultrasounds or other Referrals:   None Maternal Substance Abuse:  No Significant Maternal Medications:  None Significant Maternal Lab Results: Group B Strep negative and Rh negative  Results for orders placed or performed during the hospital encounter of 08/31/21 (from the past 24 hour(s))  CBC   Collection Time: 08/31/21  8:49 AM  Result Value Ref Range   WBC 8.3 4.0 - 10.5 K/uL   RBC 4.09 3.87 - 5.11 MIL/uL   Hemoglobin 9.6 (L) 12.0 - 15.0 g/dL   HCT 29.9 (L) 36.0 - 46.0 %   MCV 73.1 (L) 80.0 - 100.0 fL   MCH 23.5 (  L) 26.0 - 34.0 pg   MCHC 32.1 30.0 - 36.0 g/dL   RDW 16.5 (H) 11.5 - 15.5 %   Platelets 202 150 - 400 K/uL   nRBC 0.8 (H) 0.0 - 0.2 %  Type and screen   Collection Time: 08/31/21  8:49 AM  Result Value Ref Range   ABO/RH(D) PENDING    Antibody Screen PENDING    Sample Expiration      09/03/2021,2359 Performed at [facility], [place] [address] [place], [place] [address]     Patient Active Problem List   Diagnosis Date Noted   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   [redacted] weeks gestation of pregnancy 08/18/2021   HSV infection 08/11/2021   Supervision of low-risk pregnancy 02/09/2021   Abnormal involuntary movements 08/04/2020   Migraine with aura and without status migrainosus, not intractable 08/12/2019   Seizures ([place])    Sickle cell trait ([place]) 02/13/2019   Rh negative, antepartum 02/05/2019   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 G3P1011 at [redacted]w[redacted]d here forScheduled Cesarean section   #Scheduled CS The risks of cesarean section were discussed with the patient including but were  not limited to: bleeding which may require transfusion or reoperation; infection which may require antibiotics; injury to bowel, bladder, ureters or other surrounding organs; injury to the fetus; need for additional procedures including hysterectomy in the event of a life-threatening hemorrhage; placental abnormalities wth subsequent pregnancies, incisional problems, thromboembolic phenomenon and other postoperative/anesthesia complications. The patient concurred with the proposed plan, giving informed written consent for the procedures.  Patient has been NPO since midnight she will remain NPO for procedure. Anesthesia and OR aware.  Preoperative prophylactic antibiotics and SCDs ordered on call to the OR.  To OR when ready.  # HSV primary outbreak: treated, reports symptoms and lesions have improved but not resolved. Indication for primary c/s  # Early labor: gbs neg, membranes intact. Terbutaline given while waiting on labs to result. Will maintain fetal monitoring prior to surgery and and monitor for onset of active labor  #Pain: Spinal  # Seizure disorder: followed by neurology, off meds for a few years, last seizure was 2015. Will plan for close neurology f/u, importance of good sleep to the extent that's possible in the postpartum period  # Rh neg: received rhogam at 28 wks. Will plan w/u PP  # SST: doesn't appear had father testing. Will notify peds  #ID:  Gbs neg, ancef 2 g prophylaxis ordered, gbs neg #MOF: bottle #MOC: IUD, desires @ 6 wks #Circ:  N/a  Desma Maxim, MD  08/31/2021, 9:15 AM

## 2021-08-31 ENCOUNTER — Encounter (HOSPITAL_COMMUNITY)
Admission: RE | Disposition: A | Discharge status: Home / Self Care | Payer: Self-pay | Source: Ambulatory Visit | Attending: Obstetrics and Gynecology

## 2021-08-31 ENCOUNTER — Inpatient Hospital Stay (HOSPITAL_COMMUNITY): Payer: Medicaid Other | Admitting: Anesthesiology

## 2021-08-31 ENCOUNTER — Encounter: Payer: Medicaid Other | Admitting: Nurse Practitioner

## 2021-08-31 ENCOUNTER — Other Ambulatory Visit: Payer: Self-pay

## 2021-08-31 ENCOUNTER — Inpatient Hospital Stay (HOSPITAL_COMMUNITY)
Admission: RE | Admit: 2021-08-31 | Discharge: 2021-09-03 | DRG: 787 | Disposition: A | Discharge status: Home / Self Care | Payer: Medicaid Other | Source: Ambulatory Visit | Attending: Obstetrics and Gynecology | Admitting: Obstetrics and Gynecology

## 2021-08-31 ENCOUNTER — Encounter (HOSPITAL_COMMUNITY): Payer: Self-pay | Admitting: Obstetrics and Gynecology

## 2021-08-31 DIAGNOSIS — D509 Iron deficiency anemia, unspecified: Secondary | ICD-10-CM | POA: Diagnosis present

## 2021-08-31 DIAGNOSIS — O99334 Smoking (tobacco) complicating childbirth: Secondary | ICD-10-CM | POA: Diagnosis present

## 2021-08-31 DIAGNOSIS — G40209 Localization-related (focal) (partial) symptomatic epilepsy and epileptic syndromes with complex partial seizures, not intractable, without status epilepticus: Secondary | ICD-10-CM | POA: Diagnosis present

## 2021-08-31 DIAGNOSIS — K5900 Constipation, unspecified: Secondary | ICD-10-CM | POA: Diagnosis not present

## 2021-08-31 DIAGNOSIS — O99893 Other specified diseases and conditions complicating puerperium: Secondary | ICD-10-CM | POA: Diagnosis not present

## 2021-08-31 DIAGNOSIS — A6000 Herpesviral infection of urogenital system, unspecified: Secondary | ICD-10-CM | POA: Diagnosis present

## 2021-08-31 DIAGNOSIS — O9902 Anemia complicating childbirth: Secondary | ICD-10-CM | POA: Diagnosis present

## 2021-08-31 DIAGNOSIS — G40109 Localization-related (focal) (partial) symptomatic epilepsy and epileptic syndromes with simple partial seizures, not intractable, without status epilepticus: Secondary | ICD-10-CM | POA: Diagnosis present

## 2021-08-31 DIAGNOSIS — Z98891 History of uterine scar from previous surgery: Secondary | ICD-10-CM

## 2021-08-31 DIAGNOSIS — D573 Sickle-cell trait: Secondary | ICD-10-CM | POA: Diagnosis present

## 2021-08-31 DIAGNOSIS — Z3A39 39 weeks gestation of pregnancy: Secondary | ICD-10-CM | POA: Diagnosis not present

## 2021-08-31 DIAGNOSIS — F17210 Nicotine dependence, cigarettes, uncomplicated: Secondary | ICD-10-CM | POA: Diagnosis present

## 2021-08-31 DIAGNOSIS — B009 Herpesviral infection, unspecified: Secondary | ICD-10-CM | POA: Diagnosis present

## 2021-08-31 DIAGNOSIS — O26899 Other specified pregnancy related conditions, unspecified trimester: Secondary | ICD-10-CM

## 2021-08-31 DIAGNOSIS — O26893 Other specified pregnancy related conditions, third trimester: Secondary | ICD-10-CM | POA: Diagnosis present

## 2021-08-31 DIAGNOSIS — Z6791 Unspecified blood type, Rh negative: Secondary | ICD-10-CM | POA: Diagnosis not present

## 2021-08-31 DIAGNOSIS — Z23 Encounter for immunization: Secondary | ICD-10-CM | POA: Diagnosis not present

## 2021-08-31 DIAGNOSIS — O99354 Diseases of the nervous system complicating childbirth: Secondary | ICD-10-CM | POA: Diagnosis present

## 2021-08-31 DIAGNOSIS — Z3A37 37 weeks gestation of pregnancy: Secondary | ICD-10-CM

## 2021-08-31 DIAGNOSIS — O9832 Other infections with a predominantly sexual mode of transmission complicating childbirth: Principal | ICD-10-CM | POA: Diagnosis present

## 2021-08-31 DIAGNOSIS — O9852 Other viral diseases complicating childbirth: Secondary | ICD-10-CM | POA: Diagnosis not present

## 2021-08-31 DIAGNOSIS — R569 Unspecified convulsions: Secondary | ICD-10-CM

## 2021-08-31 DIAGNOSIS — Z3490 Encounter for supervision of normal pregnancy, unspecified, unspecified trimester: Secondary | ICD-10-CM

## 2021-08-31 HISTORY — DX: History of uterine scar from previous surgery: Z98.891

## 2021-08-31 LAB — TYPE AND SCREEN
ABO/RH(D): O NEG
Antibody Screen: NEGATIVE

## 2021-08-31 LAB — CBC
HCT: 29.9 % — ABNORMAL LOW (ref 36.0–46.0)
Hemoglobin: 9.6 g/dL — ABNORMAL LOW (ref 12.0–15.0)
MCH: 23.5 pg — ABNORMAL LOW (ref 26.0–34.0)
MCHC: 32.1 g/dL (ref 30.0–36.0)
MCV: 73.1 fL — ABNORMAL LOW (ref 80.0–100.0)
Platelets: 202 10*3/uL (ref 150–400)
RBC: 4.09 MIL/uL (ref 3.87–5.11)
RDW: 16.5 % — ABNORMAL HIGH (ref 11.5–15.5)
WBC: 8.3 10*3/uL (ref 4.0–10.5)
nRBC: 0.8 % — ABNORMAL HIGH (ref 0.0–0.2)

## 2021-08-31 LAB — RPR: RPR Ser Ql: NONREACTIVE

## 2021-08-31 SURGERY — Surgical Case
Anesthesia: Spinal | Wound class: Clean Contaminated

## 2021-08-31 MED ORDER — SODIUM CHLORIDE 0.9% FLUSH: 3.0000 mL | INTRAVENOUS | Status: DC | PRN

## 2021-08-31 MED ORDER — NALBUPHINE HCL 10 MG/ML IJ SOLN
INTRAMUSCULAR | Status: AC
  Filled 2021-08-31: qty 1

## 2021-08-31 MED ORDER — SENNOSIDES-DOCUSATE SODIUM 8.6-50 MG PO TABS
2.0000 | ORAL_TABLET | ORAL | Status: DC
  Administered 2021-09-01 – 2021-09-03 (×3): 2 via ORAL
  Filled 2021-08-31 (×3): qty 2

## 2021-08-31 MED ORDER — CEFAZOLIN SODIUM-DEXTROSE 2-4 GM/100ML-% IV SOLN
2.0000 g | INTRAVENOUS | Status: AC
  Administered 2021-08-31: 2 g via INTRAVENOUS

## 2021-08-31 MED ORDER — ONDANSETRON HCL 4 MG/2ML IJ SOLN: 4.0000 mg | Freq: Three times a day (TID) | INTRAMUSCULAR | Status: DC | PRN

## 2021-08-31 MED ORDER — LACTATED RINGERS IV SOLN: INTRAVENOUS | Status: DC

## 2021-08-31 MED ORDER — HYDROMORPHONE HCL 1 MG/ML IJ SOLN
INTRAMUSCULAR | Status: AC
  Filled 2021-08-31: qty 0.5

## 2021-08-31 MED ORDER — FENTANYL CITRATE (PF) 100 MCG/2ML IJ SOLN
INTRAMUSCULAR | Status: AC
  Filled 2021-08-31: qty 2

## 2021-08-31 MED ORDER — NALBUPHINE HCL 10 MG/ML IJ SOLN
5.0000 mg | Freq: Four times a day (QID) | INTRAMUSCULAR | Status: DC | PRN
  Administered 2021-08-31: 5 mg via INTRAVENOUS

## 2021-08-31 MED ORDER — ZOLPIDEM TARTRATE 5 MG PO TABS: 5.0000 mg | ORAL_TABLET | Freq: Every evening | ORAL | Status: DC | PRN

## 2021-08-31 MED ORDER — ONDANSETRON HCL 4 MG/2ML IJ SOLN
INTRAMUSCULAR | Status: AC
  Filled 2021-08-31: qty 2

## 2021-08-31 MED ORDER — MEPERIDINE HCL 25 MG/ML IJ SOLN: 6.2500 mg | INTRAMUSCULAR | Status: DC | PRN

## 2021-08-31 MED ORDER — MENTHOL 3 MG MT LOZG: 1.0000 | LOZENGE | OROMUCOSAL | Status: DC | PRN

## 2021-08-31 MED ORDER — ONDANSETRON HCL 4 MG/2ML IJ SOLN
INTRAMUSCULAR | Status: DC | PRN
  Administered 2021-08-31: 4 mg via INTRAVENOUS

## 2021-08-31 MED ORDER — MIDAZOLAM HCL 2 MG/2ML IJ SOLN
INTRAMUSCULAR | Status: AC
  Filled 2021-08-31: qty 2

## 2021-08-31 MED ORDER — PHENYLEPHRINE HCL-NACL 20-0.9 MG/250ML-% IV SOLN
INTRAVENOUS | Status: DC | PRN
  Administered 2021-08-31: 60 ug/min via INTRAVENOUS

## 2021-08-31 MED ORDER — DIBUCAINE (PERIANAL) 1 % EX OINT: 1.0000 "application " | TOPICAL_OINTMENT | CUTANEOUS | Status: DC | PRN

## 2021-08-31 MED ORDER — KETOROLAC TROMETHAMINE 30 MG/ML IJ SOLN
30.0000 mg | Freq: Once | INTRAMUSCULAR | Status: AC | PRN
  Administered 2021-08-31: 30 mg via INTRAVENOUS

## 2021-08-31 MED ORDER — WITCH HAZEL-GLYCERIN EX PADS: 1.0000 "application " | MEDICATED_PAD | CUTANEOUS | Status: DC | PRN

## 2021-08-31 MED ORDER — DIPHENHYDRAMINE HCL 50 MG/ML IJ SOLN: 12.5000 mg | INTRAMUSCULAR | Status: DC | PRN

## 2021-08-31 MED ORDER — LACTATED RINGERS IV SOLN: INTRAVENOUS | Status: DC | PRN

## 2021-08-31 MED ORDER — NALOXONE HCL 4 MG/10ML IJ SOLN
1.0000 ug/kg/h | INTRAVENOUS | Status: DC | PRN
  Filled 2021-08-31: qty 5

## 2021-08-31 MED ORDER — NALBUPHINE HCL 10 MG/ML IJ SOLN
5.0000 mg | Freq: Four times a day (QID) | INTRAMUSCULAR | Status: DC | PRN
  Administered 2021-08-31: 5 mg via INTRAVENOUS
  Filled 2021-08-31: qty 1

## 2021-08-31 MED ORDER — OXYCODONE HCL 5 MG PO TABS: 5.0000 mg | ORAL_TABLET | ORAL | Status: DC | PRN

## 2021-08-31 MED ORDER — SIMETHICONE 80 MG PO CHEW
80.0000 mg | CHEWABLE_TABLET | ORAL | Status: DC | PRN
  Administered 2021-09-02 – 2021-09-03 (×2): 80 mg via ORAL
  Filled 2021-08-31 (×2): qty 1

## 2021-08-31 MED ORDER — DIPHENHYDRAMINE HCL 25 MG PO CAPS: 25.0000 mg | ORAL_CAPSULE | ORAL | Status: DC | PRN

## 2021-08-31 MED ORDER — STERILE WATER FOR IRRIGATION IR SOLN
Status: DC | PRN
  Administered 2021-08-31: 1000 mL

## 2021-08-31 MED ORDER — OXYCODONE HCL 5 MG PO TABS: 5.0000 mg | ORAL_TABLET | Freq: Once | ORAL | Status: DC | PRN

## 2021-08-31 MED ORDER — SODIUM CHLORIDE 0.9 % IR SOLN
Status: DC | PRN
  Administered 2021-08-31: 1000 mL

## 2021-08-31 MED ORDER — OXYTOCIN-SODIUM CHLORIDE 30-0.9 UT/500ML-% IV SOLN
2.5000 [IU]/h | INTRAVENOUS | Status: AC
  Administered 2021-08-31: 2.5 [IU]/h via INTRAVENOUS
  Filled 2021-08-31: qty 500

## 2021-08-31 MED ORDER — MEASLES, MUMPS & RUBELLA VAC IJ SOLR: 0.5000 mL | Freq: Once | INTRAMUSCULAR | Status: DC

## 2021-08-31 MED ORDER — ENOXAPARIN SODIUM 60 MG/0.6ML IJ SOSY
50.0000 mg | PREFILLED_SYRINGE | INTRAMUSCULAR | Status: DC
  Administered 2021-09-01 – 2021-09-03 (×3): 50 mg via SUBCUTANEOUS
  Filled 2021-08-31 (×3): qty 0.6

## 2021-08-31 MED ORDER — IBUPROFEN 600 MG PO TABS
600.0000 mg | ORAL_TABLET | Freq: Four times a day (QID) | ORAL | Status: AC
  Administered 2021-08-31 – 2021-09-03 (×12): 600 mg via ORAL
  Filled 2021-08-31 (×13): qty 1

## 2021-08-31 MED ORDER — HYDROMORPHONE HCL 1 MG/ML IJ SOLN
0.2500 mg | INTRAMUSCULAR | Status: DC | PRN
  Administered 2021-08-31: 0.5 mg via INTRAVENOUS

## 2021-08-31 MED ORDER — OXYTOCIN-SODIUM CHLORIDE 30-0.9 UT/500ML-% IV SOLN
INTRAVENOUS | Status: AC
  Filled 2021-08-31: qty 500

## 2021-08-31 MED ORDER — DEXMEDETOMIDINE (PRECEDEX) IN NS 20 MCG/5ML (4 MCG/ML) IV SYRINGE
PREFILLED_SYRINGE | INTRAVENOUS | Status: DC | PRN
  Administered 2021-08-31: 8 ug via INTRAVENOUS
  Administered 2021-08-31: 4 ug via INTRAVENOUS
  Administered 2021-08-31: 8 ug via INTRAVENOUS

## 2021-08-31 MED ORDER — TERBUTALINE SULFATE 1 MG/ML IJ SOLN
0.2500 mg | Freq: Once | INTRAMUSCULAR | Status: AC
  Administered 2021-08-31: 0.25 mg via SUBCUTANEOUS

## 2021-08-31 MED ORDER — OXYCODONE HCL 5 MG/5ML PO SOLN: 5.0000 mg | Freq: Once | ORAL | Status: DC | PRN

## 2021-08-31 MED ORDER — NALOXONE HCL 0.4 MG/ML IJ SOLN: 0.4000 mg | INTRAMUSCULAR | Status: DC | PRN

## 2021-08-31 MED ORDER — BUPIVACAINE IN DEXTROSE 0.75-8.25 % IT SOLN
INTRATHECAL | Status: DC | PRN
  Administered 2021-08-31: 12 mg via INTRATHECAL

## 2021-08-31 MED ORDER — COCONUT OIL OIL: 1.0000 "application " | TOPICAL_OIL | Status: DC | PRN

## 2021-08-31 MED ORDER — FENTANYL CITRATE (PF) 100 MCG/2ML IJ SOLN
INTRAMUSCULAR | Status: DC | PRN
  Administered 2021-08-31: 15 ug via INTRATHECAL
  Administered 2021-08-31: 85 ug via INTRATHECAL

## 2021-08-31 MED ORDER — CEFAZOLIN SODIUM-DEXTROSE 2-4 GM/100ML-% IV SOLN
INTRAVENOUS | Status: AC
  Filled 2021-08-31: qty 100

## 2021-08-31 MED ORDER — SCOPOLAMINE 1 MG/3DAYS TD PT72: 1.0000 | MEDICATED_PATCH | Freq: Once | TRANSDERMAL | Status: DC

## 2021-08-31 MED ORDER — POVIDONE-IODINE 10 % EX SWAB: 2.0000 "application " | Freq: Once | CUTANEOUS | Status: DC

## 2021-08-31 MED ORDER — ACETAMINOPHEN 500 MG PO TABS
1000.0000 mg | ORAL_TABLET | Freq: Four times a day (QID) | ORAL | Status: DC
  Administered 2021-08-31 – 2021-09-03 (×11): 1000 mg via ORAL
  Filled 2021-08-31 (×13): qty 2

## 2021-08-31 MED ORDER — SIMETHICONE 80 MG PO CHEW
80.0000 mg | CHEWABLE_TABLET | Freq: Three times a day (TID) | ORAL | Status: DC
  Administered 2021-08-31 – 2021-09-03 (×10): 80 mg via ORAL
  Filled 2021-08-31 (×10): qty 1

## 2021-08-31 MED ORDER — MORPHINE SULFATE (PF) 0.5 MG/ML IJ SOLN
INTRAMUSCULAR | Status: AC
  Filled 2021-08-31: qty 10

## 2021-08-31 MED ORDER — PRENATAL MULTIVITAMIN CH
1.0000 | ORAL_TABLET | Freq: Every day | ORAL | Status: DC
  Administered 2021-09-01 – 2021-09-03 (×3): 1 via ORAL
  Filled 2021-08-31 (×3): qty 1

## 2021-08-31 MED ORDER — KETOROLAC TROMETHAMINE 30 MG/ML IJ SOLN
INTRAMUSCULAR | Status: AC
  Filled 2021-08-31: qty 1

## 2021-08-31 MED ORDER — TERBUTALINE SULFATE 1 MG/ML IJ SOLN
INTRAMUSCULAR | Status: AC
  Filled 2021-08-31: qty 1

## 2021-08-31 MED ORDER — ONDANSETRON HCL 4 MG/2ML IJ SOLN: 4.0000 mg | Freq: Once | INTRAMUSCULAR | Status: DC | PRN

## 2021-08-31 MED ORDER — MORPHINE SULFATE (PF) 0.5 MG/ML IJ SOLN
INTRAMUSCULAR | Status: DC | PRN
  Administered 2021-08-31: 150 ug via INTRATHECAL

## 2021-08-31 SURGICAL SUPPLY — 34 items
APL SKNCLS STERI-STRIP NONHPOA (GAUZE/BANDAGES/DRESSINGS) ×1
BENZOIN TINCTURE PRP APPL 2/3 (GAUZE/BANDAGES/DRESSINGS) ×3 IMPLANT
CHLORAPREP W/TINT 26ML (MISCELLANEOUS) ×3 IMPLANT
CLAMP CORD UMBIL (MISCELLANEOUS) IMPLANT
CLOSURE STERI STRIP 1/2 X4 (GAUZE/BANDAGES/DRESSINGS) ×1 IMPLANT
CLOTH BEACON ORANGE TIMEOUT ST (SAFETY) ×3 IMPLANT
DRSG OPSITE POSTOP 4X10 (GAUZE/BANDAGES/DRESSINGS) ×3 IMPLANT
ELECT REM PT RETURN 9FT ADLT (ELECTROSURGICAL) ×2
ELECTRODE REM PT RTRN 9FT ADLT (ELECTROSURGICAL) ×2 IMPLANT
EXTRACTOR VACUUM M CUP 4 TUBE (SUCTIONS) IMPLANT
GLOVE BIOGEL PI IND STRL 7.0 (GLOVE) ×4 IMPLANT
GLOVE BIOGEL PI IND STRL 7.5 (GLOVE) ×4 IMPLANT
GLOVE BIOGEL PI INDICATOR 7.0 (GLOVE) ×2
GLOVE BIOGEL PI INDICATOR 7.5 (GLOVE) ×2
GLOVE ECLIPSE 7.5 STRL STRAW (GLOVE) ×3 IMPLANT
GOWN STRL REUS W/TWL LRG LVL3 (GOWN DISPOSABLE) ×9 IMPLANT
KIT ABG SYR 3ML LUER SLIP (SYRINGE) IMPLANT
NDL HYPO 25X5/8 SAFETYGLIDE (NEEDLE) IMPLANT
NEEDLE HYPO 25X5/8 SAFETYGLIDE (NEEDLE) IMPLANT
NS IRRIG 1000ML POUR BTL (IV SOLUTION) ×3 IMPLANT
PACK C SECTION WH (CUSTOM PROCEDURE TRAY) ×3 IMPLANT
PAD OB MATERNITY 4.3X12.25 (PERSONAL CARE ITEMS) ×3 IMPLANT
PENCIL SMOKE EVAC W/HOLSTER (ELECTROSURGICAL) ×3 IMPLANT
RTRCTR C-SECT PINK 25CM LRG (MISCELLANEOUS) ×3 IMPLANT
STRIP CLOSURE SKIN 1/2X4 (GAUZE/BANDAGES/DRESSINGS) ×3 IMPLANT
SUT VIC AB 0 CT1 36 (SUTURE) ×3 IMPLANT
SUT VIC AB 0 CTX 36 (SUTURE) ×4
SUT VIC AB 0 CTX36XBRD ANBCTRL (SUTURE) ×4 IMPLANT
SUT VIC AB 2-0 CT1 27 (SUTURE) ×2
SUT VIC AB 2-0 CT1 TAPERPNT 27 (SUTURE) ×2 IMPLANT
SUT VIC AB 4-0 KS 27 (SUTURE) ×3 IMPLANT
TOWEL OR 17X24 6PK STRL BLUE (TOWEL DISPOSABLE) ×3 IMPLANT
TRAY FOLEY W/BAG SLVR 14FR LF (SET/KITS/TRAYS/PACK) ×3 IMPLANT
WATER STERILE IRR 1000ML POUR (IV SOLUTION) ×3 IMPLANT

## 2021-08-31 NOTE — Op Note (Signed)
Operative Note   Patient: Melissa [name]  Date of Procedure: 08/31/2021  Procedure: Primary Low Transverse Cesarean   Indications:  Primary HSV outbreak [redacted]w[redacted]d  Pre-operative Diagnosis: HSV outbreak/ [redacted] weeks pregnant/ primary c/section.   Post-operative Diagnosis: Same  TOLAC Candidate: Yes   Surgeon: Surgeon(s) and Role:    * Wouk, Ailene Rud, MD - Primary    * Renard Matter, MD - Assisting  An experienced assistant was required given the standard of surgical care given the complexity of the case.  This assistant was needed for exposure, dissection, suctioning, retraction, instrument exchange, assisting with delivery with administration of fundal pressure, and for overall help during the procedure.   Anesthesia: spinal  Anesthesiologist: Barnet Glasgow, MD   Antibiotics: Cefazolin   Estimated Blood Loss: 641 ml   Total IV Fluids: 1200 ml +1077mL in PACU  Urine Output:  700 cc OF clear urine  Specimens: None   Complications: no complications   Indications: Michellie Auxier is a 21 y.o. [id number] with an IUP [redacted]w[redacted]d presenting for scheduled cesarean secondary to the indications listed above. Clinical course notable for patient contracting at presentation to the PACU and was found to be 5cm. She was given a dose of Terbutaline for tocolysis.   Findings: Viable infant in cephalic presentation, no nuchal cord present. Apgars 9 , 9 , . Weight 3640 g . Clear amniotic fluid. Normal placenta, three vessel cord. Normal uterus.  Procedure Details: A Time Out was held and the above information confirmed. The patient received intravenous antibiotics and had sequential compression devices applied to her lower extremities preoperatively. The patient was taken back to the operative suite where spinal anesthesia was administered. After induction of anesthesia, the patient was draped and prepped in the usual sterile manner and placed in a dorsal supine position with a leftward  tilt. A low transverse skin incision was made with scalpel and carried down through the subcutaneous tissue to the fascia. Fascial incision was made and extended transversely. The fascia was separated from the underlying rectus tissue superiorly and inferiorly. The rectus muscles were separated in the midline bluntly and the peritoneum was entered bluntly. An Alexis retractor was placed to aid in visualization of the uterus. A bladder flap was not developed. A low transverse uterine incision was made. The infant was successfully delivered from cephalic presentation, the umbilical cord was clamped after 1 minute. Cord ph was not sent, and cord blood was obtained for evaluation. The placenta was removed Intact and appeared normal. The uterine incision was closed with running locked sutures of 0-Vicryl and then a second imbricating layer was also placed with 0-Vicryl. There was small amount of residual bleeding noted at left side of hysterotomy which stopped after one through and through stitch was done during imbricating layer. After that stitch and imbricating layer completed excellent hemostasis was noted. The abdomen and the pelvis were cleared of all clot and debris and the Ubaldo Glassing was removed. Hemostasis was confirmed on all surfaces.  The peritoneum was was not reapproximated. The fascia was then closed using 0 Vicryl in a running fashion. The subcutaneous layer was not re-approximated as there was less than 2 cm of subcutaneous tissue and the skin was closed with a 4-0 vicryl subcuticular stitch. The patient tolerated the procedure well. Sponge, lap, instrument and needle counts were correct x 2. She was taken to the recovery room in stable condition.  Disposition: PACU - hemodynamically stable.    Signed: Renard Matter, MD,  [facility] [employer] farm [employer])

## 2021-08-31 NOTE — Anesthesia Postprocedure Evaluation (Signed)
Anesthesia Post Note ° °Patient: Melissa [name] ° °Procedure(s) Performed: CESAREAN SECTION ° °  ° °Patient location during evaluation: Mother Baby °Anesthesia Type: Spinal °Level of consciousness: oriented and awake and alert °Pain management: pain level controlled °Vital Signs Assessment: post-procedure vital signs reviewed and stable °Respiratory status: spontaneous breathing and respiratory function stable °Cardiovascular status: blood pressure returned to baseline and stable °Postop Assessment: no headache, no backache, no apparent nausea or vomiting and able to ambulate °Anesthetic complications: no ° ° °No notable events documented. ° °Last Vitals:  °Vitals:  ° 08/31/21 1640 08/31/21 1806  °BP: 100/66   °Pulse: 76   °Resp: 17 17  °Temp: 36.7 °C 36.9 °C  °SpO2: 100% 100%  °  °Last Pain:  °Vitals:  ° 08/31/21 1806  °TempSrc: Oral  °PainSc: 0-No pain  ° °Pain Goal:   ° °  °  °  °  °  °  °  ° °Carzell Saldivar A Malayah Demuro ° ° ° ° °

## 2021-08-31 NOTE — Discharge Summary (Signed)
Postpartum Discharge Summary     Patient Name: Melissa [name] DOB: [birth year] MRN: [id number]  Date of admission: 08/31/2021 Delivery date:08/31/2021  Delivering provider: Laurey Arrow [facility]  Date of discharge: 09/03/2021  Admitting diagnosis: Herpes genitalis [A60.00] Status post primary low transverse cesarean section [Z98.891] Intrauterine pregnancy: [redacted]w[redacted]d    Secondary diagnosis:  Principal Problem:   Herpes genitalis Active Problems:   [place] with impairment of consciousness ([place]   Rh negative, antepartum   Sickle cell trait ([place]   Seizures ([place]   Supervision of low-risk pregnancy   HSV infection   History of primary cesarean section   Status post primary low transverse cesarean section  Additional problems: None    Discharge diagnosis: Term Pregnancy Delivered and HSV infection                                               Post partum procedures:rhogam Augmentation: N/A Complications: None  Hospital course: Sceduled C/S   21y.o. yo [id number] admitted to the hospital 08/31/2021 for scheduled cesarean section with the following indication:Active HSV.Delivery details are as follows:  Membrane Rupture Time/Date: 10:41 AM ,08/31/2021   Delivery Method:C-Section, Low Transverse  Details of operation can be found in separate operative note.  Patient had an uncomplicated postpartum course.  She is ambulating, tolerating a regular diet, passing flatus, and urinating well. Patient is discharged home in stable condition on  09/03/21        Newborn Data: Birth date:08/31/2021  Birth time:10:41 AM  Gender:Female  Living status:Living  Apgars:9 ,9  Weight:3640 g     Magnesium Sulfate received: No BMZ received: No Rhophylac:N/A MMR:N/A T-DaP:Given prenatally Flu: Given prenatally Transfusion:No  Physical exam  Vitals:   09/02/21 0508 09/02/21 1300 09/02/21 1949 09/03/21 0500  BP: 109/64 101/69 109/72 (!) 90/59  Pulse: 90 72 71 67  Resp:  '17 18 16 16  ' Temp: 98 F (36.7 C) 98.2 F (36.8 C) 98.2 F (36.8 C) 98.8 F (37.1 C)  TempSrc: Oral Oral    SpO2: 100% 100% 100% 99%  Weight:      Height:       General: alert, cooperative, and no distress Lochia: appropriate Uterine Fundus: firm Incision: Healing well with no significant drainage DVT Evaluation: No evidence of DVT seen on physical exam. Labs: Lab Results  Component Value Date   WBC 8.3 08/31/2021   HGB 9.1 (L) 09/01/2021   HCT 29.9 (L) 08/31/2021   MCV 73.1 (L) 08/31/2021   PLT 202 08/31/2021   CMP Latest Ref Rng & Units 03/28/2021  Glucose 70 - 99 mg/dL 85  BUN 6 - 20 mg/dL 5(L)  Creatinine 0.44 - 1.00 mg/dL 0.63  Sodium 135 - 145 mmol/L 135  Potassium 3.5 - 5.1 mmol/L 3.9  Chloride 98 - 111 mmol/L 103  CO2 22 - 32 mmol/L 24  Calcium 8.9 - 10.3 mg/dL 8.9  Total Protein 6.5 - 8.1 g/dL 6.0(L)  Total Bilirubin 0.3 - 1.2 mg/dL 0.6  Alkaline Phos 38 - 126 U/L 54  AST 15 - 41 U/L 13(L)  ALT 0 - 44 U/L 9   Edinburgh Score: Edinburgh Postnatal Depression Scale Screening Tool 08/31/2021  I have been able to laugh and see the funny side of things. 0  I have looked forward with enjoyment to things. 0  I have blamed myself unnecessarily when things went wrong. 0  I have been anxious or worried for no good reason. 2  I have felt scared or panicky for no good reason. 0  Things have been getting on top of me. 0  I have been so unhappy that I have had difficulty sleeping. 0  I have felt sad or miserable. 0  I have been so unhappy that I have been crying. 0  The thought of harming myself has occurred to me. 0  Edinburgh Postnatal Depression Scale Total 2     After visit meds:  Allergies as of 09/03/2021       Reactions   Benadryl [diphenhydramine Hcl] Other (See Comments)   Causes seizures   Gluten Meal Nausea And Vomiting   Shellfish Allergy Anaphylaxis   Patient states her throat gets tight   Zithromax [azithromycin] Anaphylaxis, Swelling   Lactose  Intolerance (gi) Diarrhea        Medication List     TAKE these medications    acetaminophen 325 MG tablet Commonly known as: TYLENOL Take 650 mg by mouth every 6 (six) hours as needed. What changed: Another medication with the same name was added. Make sure you understand how and when to take each.   acetaminophen 500 MG tablet Commonly known as: TYLENOL Take 2 tablets (1,000 mg total) by mouth every 6 (six) hours. What changed: You were already taking a medication with the same name, and this prescription was added. Make sure you understand how and when to take each.   clotrimazole 1 % cream Commonly known as: LOTRIMIN Apply 1 application topically 2 (two) times daily. What changed:  when to take this reasons to take this   docusate sodium 100 MG capsule Commonly known as: COLACE Take 1 capsule (100 mg total) by mouth at bedtime. Start after completion of miralax. What changed:  when to take this reasons to take this   ferrous sulfate 325 (65 FE) MG tablet Take 325 mg by mouth daily with breakfast.   ibuprofen 600 MG tablet Commonly known as: ADVIL Take 1 tablet (600 mg total) by mouth every 6 (six) hours.   oxyCODONE 5 MG immediate release tablet Commonly known as: Oxy IR/ROXICODONE Take 1 tablet (5 mg total) by mouth every 4 (four) hours as needed for moderate pain.   polyethylene glycol 17 g packet Commonly known as: MIRALAX / GLYCOLAX Take 17 g by mouth daily as needed for moderate constipation or mild constipation.   PrePLUS 27-1 MG Tabs Take 1 tablet by mouth daily.   valACYclovir 1000 MG tablet Commonly known as: VALTREX Take 1,000 mg by mouth 2 (two) times daily.         Discharge home in stable condition Infant Feeding: Bottle Infant Disposition:home with mother Discharge instruction: per After Visit Summary and Postpartum booklet. Activity: Advance as tolerated. Pelvic rest for 6 weeks.  Diet: routine diet Future Appointments: Future  Appointments  Date Time Provider [place]  09/13/2021  1:30 PM Woodroe Mode, MD [facility] None  09/13/2021  2:15 PM Lynnea Ferrier, LCSW [facility] None  10/04/2021  3:50 PM Constant, Vickii Chafe, MD [place] None   Follow up Visit: Message sent to [facility] by Dr. Cy Blamer on 08/31/2020 Please schedule this patient for a In person postpartum visit in 4 weeks with the following provider: Any provider. Additional Postpartum F/U:Incision check 1 week  High risk pregnancy complicated by:  Seizure disorder Delivery mode:  C-Section, Low Transverse  Anticipated Birth  Control:   plans outpatient IUD   09/03/2021 Renee Harder, CNM

## 2021-08-31 NOTE — Anesthesia Postprocedure Evaluation (Signed)
Anesthesia Post Note  Patient: Melissa [name]  Procedure(s) Performed: CESAREAN SECTION     Patient location during evaluation: Mother Baby Anesthesia Type: Spinal Level of consciousness: oriented and awake and alert Pain management: pain level controlled Vital Signs Assessment: post-procedure vital signs reviewed and stable Respiratory status: spontaneous breathing and respiratory function stable Cardiovascular status: blood pressure returned to baseline and stable Postop Assessment: no headache, no backache, no apparent nausea or vomiting and able to ambulate Anesthetic complications: no   No notable events documented.  Last Vitals:  Vitals:   08/31/21 1640 08/31/21 1806  BP: 100/66   Pulse: 76   Resp: 17 17  Temp: 36.7 C 36.9 C  SpO2: 100% 100%    Last Pain:  Vitals:   08/31/21 1806  TempSrc: Oral  PainSc: 0-No pain   Pain Goal:                   Trevor Iha

## 2021-08-31 NOTE — Transfer of Care (Signed)
Immediate Anesthesia Transfer of Care Note  Patient: Melissa [name]  Procedure(s) Performed: CESAREAN SECTION  Patient Location: PACU  Anesthesia Type:Spinal  Level of Consciousness: awake  Airway & Oxygen Therapy: Patient Spontanous Breathing  Post-op Assessment: Report given to RN  Post vital signs: Reviewed  Last Vitals:  Vitals Value Taken Time  BP 109/71 08/31/21 1140  Temp 36.6 C 08/31/21 1133  Pulse 78 08/31/21 1143  Resp 20 08/31/21 1143  SpO2 100 % 08/31/21 1143  Vitals shown include unvalidated device data.  Last Pain:  Vitals:   08/31/21 1133  TempSrc: Oral  PainSc: 0-No pain         Complications: No notable events documented.

## 2021-08-31 NOTE — Anesthesia Procedure Notes (Signed)
Spinal  Patient location during procedure: OB Start time: 08/31/2021 10:09 AM End time: 08/31/2021 10:14 AM Reason for block: surgical anesthesia Staffing Performed: anesthesiologist  Anesthesiologist: Trevor Iha, MD Preanesthetic Checklist Completed: patient identified, IV checked, risks and benefits discussed, surgical consent, monitors and equipment checked, pre-op evaluation and timeout performed Spinal Block Patient position: sitting Prep: DuraPrep and site prepped and draped Patient monitoring: heart rate, cardiac monitor, continuous pulse ox and blood pressure Approach: midline Location: L3-4 Injection technique: single-shot Needle Needle type: Pencan  Needle gauge: 24 G Needle length: 10 cm Needle insertion depth: 6 cm Assessment Sensory level: T4 Events: CSF return Additional Notes 2 Attempt (s). Pt tolerated procedure well.

## 2021-09-01 LAB — GLUCOSE, CAPILLARY: Glucose-Capillary: 91 mg/dL (ref 70–99)

## 2021-09-01 LAB — HEMOGLOBIN: Hemoglobin: 9.1 g/dL — ABNORMAL LOW (ref 12.0–15.0)

## 2021-09-01 MED ORDER — RHO D IMMUNE GLOBULIN 1500 UNIT/2ML IJ SOSY
300.0000 ug | PREFILLED_SYRINGE | Freq: Once | INTRAMUSCULAR | Status: AC
  Administered 2021-09-01: 300 ug via INTRAVENOUS
  Filled 2021-09-01: qty 2

## 2021-09-01 MED ORDER — PNEUMOCOCCAL VAC POLYVALENT 25 MCG/0.5ML IJ INJ
0.5000 mL | INJECTION | INTRAMUSCULAR | Status: AC
  Administered 2021-09-03: 0.5 mL via INTRAMUSCULAR
  Filled 2021-09-01 (×2): qty 0.5

## 2021-09-01 MED ORDER — FERROUS SULFATE 325 (65 FE) MG PO TABS
325.0000 mg | ORAL_TABLET | ORAL | Status: DC
  Filled 2021-09-01: qty 1

## 2021-09-01 MED ORDER — SODIUM CHLORIDE 0.9 % IV SOLN
500.0000 mg | Freq: Once | INTRAVENOUS | Status: AC
  Administered 2021-09-01: 500 mg via INTRAVENOUS
  Filled 2021-09-01: qty 25

## 2021-09-01 MED ORDER — POLYETHYLENE GLYCOL 3350 17 G PO PACK
17.0000 g | PACK | Freq: Every day | ORAL | Status: DC
  Administered 2021-09-01 – 2021-09-03 (×3): 17 g via ORAL
  Filled 2021-09-01 (×3): qty 1

## 2021-09-01 NOTE — Social Work (Addendum)
CSW received consult for hx of Hx of suicide attempt and intellectual disabilities. CSW met with MOB to offer support and complete assessment.    CSW met with MOB at bedside and introduced CSW role. CSW observed MOB sitting on the sofa holding the infant and interacting appropriately. MOB sister was present. CSW offered MOB privacy. MOB presented calm and welcomed CSW to complete the assessment with her sister present. MOB identified her sister, mom and extended family as supports. CSW inquired how MOB has felt since giving birth. MOB reported feeling okay but is still having pain from the c-section. MOB described the L&D experience as scary and shared after receiving the epidural she felt like she could not breath and felt like she had a panic attack. CSW inquired about MOB mental health history. MOB disclosed she was diagnosed with depression, anxiety, and mood disorder in 2016. MOB reported seeing a therapist at [facility] and [place] [facility] in 2017. MOB reported she is open to seeing a therapist again after she heals from this birth. MOB reported she has taken psychotropic medication in the past however she cannot recall the name. MOB shared that she experienced postpartum depression with her older child as evidenced by crying a lot and felt she couldn't be away from her daughter. MOB reported she discussed her symptoms with her doctor and was offered medication but declined. MOB reported she is open to taking medication this time. MOB expressed she would like to restart medication for her anxiety today, if possible. CSW encouraged MOB to speak with her nurse and that CSW would follow up with the nurse as well. CSW inquired about MOB coping strategies. MOB reported she likes to spend time with her daughter, writes out she feels, works, and goes for a walk when needed. CSW assessed MOB for safety. MOB denied thoughts of harm to self and others. MOB disclosed last year she had passive thoughts of SI while  going through a stressful time but never acted on them. CSW provided education regarding the baby blues period vs. perinatal mood disorders, discussed treatment and gave resources for mental health follow up if concerns arise. CSW recommended MOB complete self-evaluation during the postpartum time period using the New Mom Checklist from Postpartum Progress and encouraged MOB to contact a medical professional if symptoms are noted at any time. MOB reported she feels comfortable reaching out to her doctor if she has concerns.   MOB reported she has a bassinet where the infant will sleep, clothes however she will need assistance with additional diapers and wipes. MOB reported she is not currently employed and will need assistance with diapers and wipes. CSW provided review of Sudden Infant Death Syndrome (SIDS) precautions. MOB has chosen [facility] [employer] for the infant's follow up care and will have transportation. CSW assessed MOB for additional needs. MOB reported no further needs.   CSW notified the nurse of MOB request to restart medication for anxiety symptoms.   Referral made to [facility].   Per chart review, MOB sees IBH counselor at [facility].  CSW identifies no further need for intervention and no barriers to discharge at this time.   Melissa [name], MSW, LCSW [facility] [place] Worker  [phone] 09/01/2021  12:49 PM

## 2021-09-01 NOTE — Progress Notes (Addendum)
Patient ID: Melissa [name], female   DOB: Mar 06, 2001, 21 y.o.   MRN: [id number]  POSTPARTUM PROGRESS NOTE  Post Op Day 1  Subjective: Called by bedside RN to discuss Iron, anxiety, and HSV with patient as patient had some questions.   She has been symptomatic from her iron deficiency with fatigue and lightheadedness. She reports constipation due to oral iron and requested an IV iron infusion.    She has a long history of anxiety and experienced increased anxiety yesterday before her c-section. We discussed medication options including hydroxyzine and SSRIs in addition to therapy. She has been feeling more calm now post C section and will follow up at her post partum visit.    She is not currently experiencing symptoms due to HSV. She had some tingling at the tip of her tongue yesterday that has now resolved and has not noticed any bumps on her tongue. She is not having any perineal symptoms.   Objective: Blood pressure 96/64, pulse 81, temperature 98.1 F (36.7 C), temperature source Axillary, resp. rate 18, height 5\' 7"  (1.702 m), weight 101.6 kg, last menstrual period 12/01/2020, SpO2 100 %, unknown if currently breastfeeding.  Physical Exam:  General: alert, cooperative and no distress Chest: no respiratory distress Skin: warm, dry; no visible HSV lesions on buccal mucosa or tongue.  Recent Labs    08/31/21 0849 09/01/21 0528  HGB 9.6* 9.1*  HCT 29.9*  --     Assessment/Plan: Melissa [name] is a 21 y.o. 22 s/p pLTCS at [redacted]w[redacted]d.  #POD 1  - Received 2nd Rhogam injection today 09/01/21   #Iron deficiency anemia - IV iron infusion today 09/01/21 - Will defer PO iron at this time due to patient preference  #HSV - Currently asymptomatic, advised her to communicate with PCP for valtrex refill as needed  #Anxiety - Discussed medication, therapy, and social support. Patient will follow up at postpartum visit.  Contraception: IUD outpatient Feeding:  Bottle Dispo: Plan for discharge 1/13 or 1/14.   LOS: 1 day   2/14, Medical Student 09/01/2021, 2:44 PM   GME ATTESTATION:  I discussed the care of this patient with resident 10/30/2021 (PGY2) who evaluated her as well as Darletta Moll.  I agree with the findings and the plan of care as documented in the student's note.  Psychologist, occupational, MD, MPH OB Fellow, [facility], Center for [facility] 09/01/2021 4:14 PM

## 2021-09-01 NOTE — Progress Notes (Signed)
Post Op Day #1  Subjective: Doing well. No acute events overnight. She rates her pain at the incision site a 5/10, dressing changed this AM, bleeding is appropriate. She is eating, drinking, voiding, + flatus, and ambulating with minimal discomfort. She is bottle feeding which is going well. Denies N/V. She has no other concerns at this time.  Objective: Blood pressure 96/64, pulse 81, temperature 98.1 F (36.7 C), temperature source Axillary, resp. rate 18, height 5\' 7"  (1.702 m), weight 101.6 kg, last menstrual period 12/01/2020, SpO2 100 %, unknown if currently breastfeeding.  Physical Exam:  General: alert, cooperative, and no distress CV: Normal Rate Respiratory: Clear to auscultation, equal rise & fall, normal effort Lochia: appropriate Uterine Fundus: firm Incision: healing well, no significant drainage DVT Evaluation: No evidence of DVT seen on physical exam. Negative Homan's sign. No significant calf/ankle edema.  Recent Labs    08/31/21 0849 09/01/21 0528  HGB 9.6* 9.1*  HCT 29.9*  --     Assessment/Plan: Melissa [name] is a 21 y.o. 21 on POD#1 s/p pLTCS for HSV Pt needs 2nd Rhogam Injection. Baby O+ PO ferrous sulfate 325mg  every other day  Progressing well. Meeting postpartum milestones. VSS. Continue routine postpartum care.   Feeding: Bottle Contraception: IUD outpatient Circumcision: N/A  Dispo:  LOS: 2-3 Days  [id number], Student-PA  09/01/2021, 7:42 AM

## 2021-09-02 LAB — RH IG WORKUP (INCLUDES ABO/RH)
Fetal Screen: NEGATIVE
Gestational Age(Wks): 39
Unit division: 0

## 2021-09-02 LAB — HSV(HERPES SIMPLEX VRS) I + II AB-IGG
HSV 1 Glycoprotein G Ab, IgG: <0.91 index (ref 0.00–0.90)
HSV 2 Glycoprotein G Ab, IgG: 15.5 index — ABNORMAL HIGH (ref 0.00–0.90)

## 2021-09-02 MED ORDER — POLYETHYLENE GLYCOL 3350 17 G PO PACK: 17.0000 g | PACK | Freq: Once | ORAL | Status: DC

## 2021-09-02 NOTE — Progress Notes (Signed)
Subjective: Postpartum Day 2: Cesarean Delivery Patient reports incisional pain, tolerating PO, + flatus, and no problems voiding.   Pain not well controlled but only taking ibuprofen and tylenol C/O swelling on neck both sides, thinks due to "turning my head back and forth a lot when I was anxious".  Objective: Vital signs in last 24 hours: Temp:  [98 F (36.7 C)-98.2 F (36.8 C)] 98 F (36.7 C) (01/13 0508) Pulse Rate:  [90-93] 90 (01/13 0508) Resp:  [17-18] 17 (01/13 0508) BP: (104-109)/(64-71) 109/64 (01/13 0508) SpO2:  [100 %] 100 % (01/13 0508)  Physical Exam:  General: alert, cooperative, and no distress  Some anxiety worrying about having complications.   Slight edema bilateral sternocleidomastoid muscles, tender.  No difficulty moving neck, no neuro deficits. Lochia: appropriate Uterine Fundus: firm Incision: no significant drainage, very tender DVT Evaluation: No evidence of DVT seen on physical exam.  Recent Labs    08/31/21 0849 09/01/21 0528  HGB 9.6* 9.1*  HCT 29.9*  --   Got Venofer yesterday  Assessment/Plan: Status post Cesarean section. Doing well postoperatively.  Continue current care Advised she has to ask for analgesics, Recommend ibuprofen on schedule and try adding oxy today Up walking today Redose Miralax (has not had BM in 3 days) .Plan discharge tomorrow  Hansel Feinstein 09/02/2021, 7:54 AM

## 2021-09-02 NOTE — Progress Notes (Signed)
POSTPARTUM PROGRESS NOTE  Post Op Day 2  Subjective:  Melissa [name] is a 21 y.o. [id number] s/p pLTCS at [redacted]w[redacted]d.  No acute events overnight.  Pt denies problems with voiding or po intake. She denies nausea or vomiting. She has taken ibuprofen and Tylenol but is still experiencing incisional and gas pain. She was ambulating well yesterday but has had difficulty today due to pain. She is having bilateral neck pain and mild neck swelling that she attributes to turning her head back and forth when she was anxious during/before her C section. She has no trouble swallowing or speaking. She has had flatus. She has not had bowel movement. Lochia small.   Objective: Blood pressure 109/64, pulse 90, temperature 98 F (36.7 C), temperature source Oral, resp. rate 17, height 5\' 7"  (1.702 m), weight 101.6 kg, last menstrual period 12/01/2020, SpO2 100 %, unknown if currently breastfeeding.  Physical Exam:  General: alert, cooperative and no distress, some anxiety Neck: full ROM, tenderness to palpation bilaterally, mild edema of sternocleidomastoid muscles present Chest: no respiratory distress Heart:regular rate, distal pulses intact Abdomen: soft, tender to palpation at incision Uterine Fundus: firm DVT Evaluation: No calf swelling or erythema. Tenderness to palpation of left calf. Extremities: No edema Skin: warm, dry; incision clean/dry/intact  Recent Labs    08/31/21 0849 09/01/21 0528  HGB 9.6* 9.1*  HCT 29.9*  --     Assessment/Plan: Melissa [name] is a 21 y.o. [id number] s/p pLTCS at [redacted]w[redacted]d.  #POD 2 - Received 2nd Rhogam injection 1/12 - Continue current care  #Iron deficiency anemia - Received IV venofer 1/12  #HSV - Currently asymptomatic  #Pain - Continue tylenol and ibuprofen - Oxycodone 5 mg q4h PRN  #Constipation - Has not had BM in 3 days, scheduled miralax  Contraception: IDU outpatient Feeding: Bottle Dispo: Plan for discharge 1/14.   LOS: 2 days    Melissa [name], Medical Student 09/02/2021, 8:49 AM

## 2021-09-03 MED ORDER — OXYCODONE HCL 5 MG PO TABS: 5.0000 mg | ORAL_TABLET | ORAL | 0 refills | Status: DC | PRN

## 2021-09-03 MED ORDER — IBUPROFEN 600 MG PO TABS: 600.0000 mg | ORAL_TABLET | Freq: Four times a day (QID) | ORAL | 0 refills | Status: DC

## 2021-09-03 MED ORDER — ACETAMINOPHEN 500 MG PO TABS: 1000.0000 mg | ORAL_TABLET | Freq: Four times a day (QID) | ORAL | 0 refills | Status: DC

## 2021-09-03 NOTE — Progress Notes (Signed)
Patient and mother expressed concerns about continued bleeding and passing clots. This RN asked to assess clots and patient states clots passed 3 hours ago. Educated mom to notify of clots when seen and some bleeding is still to be expected. Fundal rub firm, midline, U/1 with no bleeding. Also c/o neck pain, gas pains, and numbness to right leg. Encouraged mom to get up and move more and to express concerns with OB during rounds. PRN simethicone given for gas pains.  Jeronimo Greaves Kohan Azizi, RN

## 2021-09-04 ENCOUNTER — Ambulatory Visit: Payer: Self-pay

## 2021-09-04 NOTE — Lactation Note (Signed)
This note was copied from a baby's chart. Lactation Consultation Note  Patient Name: Melissa [name] [id number] Date: 09/04/2021 Reason for consult: Follow-up assessment;Mother's request;Engorgement Age:22 days  Mom stated previous hx of engorgement post partum while in the hospital with her first child. For this infant, did not latch or pump, milk flowing prior to delivery. She noticed breast getting hard 1 day prior, mentioned to RN, Anda Kraft, increased today with pain.   LC applied ice for 20 min. Mom did reverse pressure with vaseline and pumped with 30 flange getting off 90 ml. Prior to [facility] arrival, pumped off 90 ml earlier with smaller flange with nipple pain.   Mom desire to end her milk supply, family member bringing cabbage leaves in am to help dry  milk supply.   Once breast softened, Mom aware to continue Ice, reveres pressure and pumping to remove any discomfort.  Mom breasts considerably softer at the end of the visit.   Maternal Data    Feeding Mother's Current Feeding Choice: Formula Nipple Type: Slow - flow  LATCH Score                    Lactation Tools Discussed/Used Tools: Pump;Flanges Flange Size: 30 Breast pump type: Double-Electric Breast Pump Reason for Pumping: relieve engorgement Pumping frequency: pumping to remove excess milk 2 1/2 to 3 hrs to relieve any discomfort switch to hand pump once pressure pain releived. Ice in between pumping, reverse pressure and then pump.  Once have cold cabbage leaves crush spines and place on breasts  replace once wilted to end milk supply and repeat  Interventions Interventions: Breast feeding basics reviewed;Hand express;Expressed milk;DEBP;Ice  Discharge Discharge Education: Engorgement and breast care  Consult Status Consult Status: Follow-up Date: 09/05/21 Follow-up type: In-patient    Teila Skalsky  Nicholson-Springer 09/04/2021, 11:35 PM

## 2021-09-05 ENCOUNTER — Encounter: Payer: Medicaid Other | Admitting: Advanced Practice Midwife

## 2021-09-06 ENCOUNTER — Ambulatory Visit (HOSPITAL_COMMUNITY)
Admission: EM | Admit: 2021-09-06 | Discharge: 2021-09-06 | Disposition: A | Discharge status: Home / Self Care | Payer: Medicaid Other | Attending: Urgent Care | Admitting: Urgent Care

## 2021-09-06 ENCOUNTER — Encounter (HOSPITAL_COMMUNITY): Payer: Self-pay | Admitting: Emergency Medicine

## 2021-09-06 ENCOUNTER — Other Ambulatory Visit: Payer: Self-pay

## 2021-09-06 DIAGNOSIS — I808 Phlebitis and thrombophlebitis of other sites: Secondary | ICD-10-CM | POA: Diagnosis not present

## 2021-09-06 DIAGNOSIS — R519 Headache, unspecified: Secondary | ICD-10-CM | POA: Diagnosis not present

## 2021-09-06 MED ORDER — IBUPROFEN 800 MG PO TABS: 800.0000 mg | ORAL_TABLET | Freq: Three times a day (TID) | ORAL | 0 refills | Status: DC

## 2021-09-06 NOTE — ED Triage Notes (Signed)
Pt reports had her baby 6 days ago and when discharged from hospital yesterday having issue with right arm swelling and knots and pain where IV was.  Pt also c/o headaches.

## 2021-09-06 NOTE — ED Provider Notes (Signed)
[name]   MRN: [id number] DOB: [birth year]  Subjective:   Melissa [name] is a 21 y.o. female presenting for persistent and progressively worsening pain, swelling from an IV site she had during her C-section. No fever, drainage or pus or bleeding. No history of dvt, blood clots. Has also had an intermittent headache. No chest pain, shob.   No current facility-administered medications for this encounter.  Current Outpatient Medications:    acetaminophen (TYLENOL) 325 MG tablet, Take 650 mg by mouth every 6 (six) hours as needed., Disp: , Rfl:    acetaminophen (TYLENOL) 500 MG tablet, Take 2 tablets (1,000 mg total) by mouth every 6 (six) hours., Disp: 30 tablet, Rfl: 0   clotrimazole (LOTRIMIN) 1 % cream, Apply 1 application topically 2 (two) times daily. (Patient taking differently: Apply 1 application topically daily as needed (yeast infection).), Disp: 30 g, Rfl: 0   docusate sodium (COLACE) 100 MG capsule, Take 1 capsule (100 mg total) by mouth at bedtime. Start after completion of miralax. (Patient taking differently: Take 100 mg by mouth daily as needed for mild constipation or moderate constipation. Start after completion of miralax.), Disp: 60 capsule, Rfl: 0   ferrous sulfate 325 (65 FE) MG tablet, Take 325 mg by mouth daily with breakfast., Disp: , Rfl:    ibuprofen (ADVIL) 600 MG tablet, Take 1 tablet (600 mg total) by mouth every 6 (six) hours., Disp: 30 tablet, Rfl: 0   oxyCODONE (OXY IR/ROXICODONE) 5 MG immediate release tablet, Take 1 tablet (5 mg total) by mouth every 4 (four) hours as needed for moderate pain., Disp: 30 tablet, Rfl: 0   polyethylene glycol (MIRALAX / GLYCOLAX) 17 g packet, Take 17 g by mouth daily as needed for moderate constipation or mild constipation., Disp: , Rfl:    Prenatal Vit-Fe Fumarate-FA (PREPLUS) 27-1 MG TABS, Take 1 tablet by mouth daily., Disp: 30 tablet, Rfl: 13   valACYclovir (VALTREX) 1000 MG tablet, Take 1,000 mg by  mouth 2 (two) times daily., Disp: , Rfl:    Allergies  Allergen Reactions   Benadryl [Diphenhydramine Hcl] Other (See Comments)    Causes seizures    Gluten Meal Nausea And Vomiting   Shellfish Allergy Anaphylaxis    Patient states her throat gets tight   Zithromax [Azithromycin] Anaphylaxis and Swelling   Lactose Intolerance (Gi) Diarrhea    Past Medical History:  Diagnosis Date   Anemia    Anxiety    Asthma    inhaler. last attack June 2019   Congenital hydronephrosis 2001   Constipation    Depression    Episodic tension-type headache, not intractable 04/16/2015   Family history of adverse reaction to anesthesia    mom rushed to hospital from Dentist office   Low back strain, sequela 11/10/2020   Migraine without aura and without status migrainosus, not intractable 04/16/2015   PID (pelvic inflammatory disease)    Rh negative state in antepartum period 02/05/2019   Seizures ([name])    last one in 2015 - on meds   Sickle cell trait ([name])    TBI (traumatic brain injury) 2006     Past Surgical History:  Procedure Laterality Date   APPENDECTOMY     CESAREAN SECTION N/A 08/31/2021   Procedure: CESAREAN SECTION;  Surgeon: Kathrynn Running, MD;  Location: [facility] LD ORS;  Service: Obstetrics;  Laterality: N/A;   HERNIA REPAIR     INTESTINAL MALROTATION REPAIR  2001    Family History  Problem Relation Age of Onset   Depression Mother    Stroke Mother    Obesity Mother    Post-traumatic stress disorder Mother    Anxiety disorder Mother    Hypertension Mother    Diabetes Mother    Schizophrenia Father    Kidney disease Father     Social History   Tobacco Use   Smoking status: Every Day    Types: Cigarettes, Cigars   Smokeless tobacco: Never   Tobacco comments:    black and milds, none since + UPT  Vaping Use   Vaping Use: Never used  Substance Use Topics   Alcohol use: Never   Drug use: Not Currently    Types: Marijuana    Comment: last 2020     ROS   Objective:   Vitals: BP 118/80 (BP Location: Left Arm)    Pulse 100    Temp 98.4 F (36.9 C) (Oral)    Resp 17    SpO2 100%   Physical Exam Constitutional:      General: She is not in acute distress.    Appearance: Normal appearance. She is well-developed. She is not ill-appearing, toxic-appearing or diaphoretic.  HENT:     Head: Normocephalic and atraumatic.     Nose: Nose normal.     Mouth/Throat:     Mouth: Mucous membranes are moist.  Eyes:     General: No scleral icterus.       Right eye: No discharge.        Left eye: No discharge.     Extraocular Movements: Extraocular movements intact.     Conjunctiva/sclera: Conjunctivae normal.  Cardiovascular:     Rate and Rhythm: Normal rate and regular rhythm.     Pulses: Normal pulses.     Heart sounds: Normal heart sounds. No murmur heard.   No friction rub. No gallop.  Pulmonary:     Effort: Pulmonary effort is normal. No respiratory distress.     Breath sounds: Normal breath sounds. No stridor. No wheezing, rhonchi or rales.  Musculoskeletal:       Arms:  Skin:    General: Skin is warm and dry.     Findings: No rash.  Neurological:     General: No focal deficit present.     Mental Status: She is alert and oriented to person, place, and time.     Cranial Nerves: No cranial nerve deficit.     Motor: No weakness.     Coordination: Coordination normal.     Gait: Gait normal.     Comments: Negative Kernig and Brudzinski.  Psychiatric:        Mood and Affect: Mood normal.        Behavior: Behavior normal.        Thought Content: Thought content normal.         Assessment and Plan :   PDMP not reviewed this encounter.  1. Superficial thrombophlebitis of right upper extremity   2. Generalized headache    Low suspicion for a DVT.  Recommended conservative management using warm compresses, NSAIDs for superficial thrombophlebitis secondary to her IV.  No acute encephalopathy.  Low suspicion for an  emergent process, infectious process. Counseled patient on potential for adverse effects with medications prescribed/recommended today, ER and return-to-clinic precautions discussed, patient verbalized understanding.    Wallis Bamberg, PA-C 09/06/21 1400

## 2021-09-06 NOTE — Discharge Instructions (Addendum)
Apply warm compresses to the area and use ibuprofen 3 times a day as needed for pain.

## 2021-09-13 ENCOUNTER — Ambulatory Visit: Payer: Medicaid Other | Admitting: Obstetrics & Gynecology

## 2021-09-13 ENCOUNTER — Institutional Professional Consult (permissible substitution): Payer: Medicaid Other | Admitting: Licensed Clinical Social Worker

## 2021-09-15 ENCOUNTER — Telehealth (HOSPITAL_COMMUNITY): Payer: Self-pay | Admitting: *Deleted

## 2021-09-15 NOTE — Telephone Encounter (Signed)
Hospital Discharge Follow-Up Call:  Patient reports that she is doing well physically and has no concerns about her healing process.  She is still having some discomfort, but she says it is manageable.  EPDS today was 9 and she endorses feeling OK given the stressors in her life.  She hesitated on question 10 at first saying "Hardly ever", but then said she did not want to harm herself or to die, she simply wanted things to get better.  She felt her worse when baby was readmitted and is now looking forward to getting back home.  Baby was readmitted to pediatrics and patient expects baby to be discharged today.  She says that baby sleeps in a bassinet at home.  ABCs of Safe Sleep reviewed.

## 2021-09-24 ENCOUNTER — Other Ambulatory Visit: Payer: Self-pay

## 2021-09-24 ENCOUNTER — Encounter (HOSPITAL_COMMUNITY): Payer: Self-pay | Admitting: Obstetrics & Gynecology

## 2021-09-24 ENCOUNTER — Inpatient Hospital Stay (HOSPITAL_COMMUNITY)
Admission: AD | Admit: 2021-09-24 | Discharge: 2021-09-24 | Disposition: A | Discharge status: Home / Self Care | Payer: Medicaid Other | Attending: Obstetrics & Gynecology | Admitting: Obstetrics & Gynecology

## 2021-09-24 DIAGNOSIS — B009 Herpesviral infection, unspecified: Secondary | ICD-10-CM | POA: Insufficient documentation

## 2021-09-24 DIAGNOSIS — O9853 Other viral diseases complicating the puerperium: Secondary | ICD-10-CM | POA: Insufficient documentation

## 2021-09-24 DIAGNOSIS — Z91040 Latex allergy status: Secondary | ICD-10-CM | POA: Diagnosis not present

## 2021-09-24 DIAGNOSIS — O9089 Other complications of the puerperium, not elsewhere classified: Secondary | ICD-10-CM | POA: Diagnosis present

## 2021-09-24 DIAGNOSIS — N898 Other specified noninflammatory disorders of vagina: Secondary | ICD-10-CM | POA: Diagnosis not present

## 2021-09-24 DIAGNOSIS — N9489 Other specified conditions associated with female genital organs and menstrual cycle: Secondary | ICD-10-CM

## 2021-09-24 LAB — URINALYSIS, ROUTINE W REFLEX MICROSCOPIC
Bilirubin Urine: NEGATIVE
Glucose, UA: NEGATIVE mg/dL
Hgb urine dipstick: NEGATIVE
Ketones, ur: NEGATIVE mg/dL
Leukocytes,Ua: NEGATIVE
Nitrite: NEGATIVE
Protein, ur: NEGATIVE mg/dL
Specific Gravity, Urine: 1.01 (ref 1.005–1.030)
pH: 6 (ref 5.0–8.0)

## 2021-09-24 MED ORDER — LIDOCAINE HCL URETHRAL/MUCOSAL 2 % EX GEL
1.0000 "application " | Freq: Once | CUTANEOUS | Status: AC
  Administered 2021-09-24: 1 via TOPICAL
  Filled 2021-09-24: qty 6

## 2021-09-24 MED ORDER — HYDROCORTISONE 1 % EX CREA
TOPICAL_CREAM | Freq: Once | CUTANEOUS | Status: DC
  Filled 2021-09-24: qty 28

## 2021-09-24 MED ORDER — LORATADINE 10 MG PO TABS: 10.0000 mg | ORAL_TABLET | Freq: Every day | ORAL | 0 refills | Status: DC

## 2021-09-24 MED ORDER — LIDOCAINE HCL URETHRAL/MUCOSAL 2 % EX GEL
1.0000 "application " | Freq: Once | CUTANEOUS | Status: DC
  Administered 2021-09-24: 1 via TOPICAL
  Filled 2021-09-24: qty 6

## 2021-09-24 MED ORDER — FAMOTIDINE 20 MG PO TABS
20.0000 mg | ORAL_TABLET | Freq: Once | ORAL | Status: AC
  Administered 2021-09-24: 20 mg via ORAL
  Filled 2021-09-24: qty 1

## 2021-09-24 MED ORDER — LORATADINE 10 MG PO TABS
10.0000 mg | ORAL_TABLET | Freq: Every day | ORAL | Status: DC
  Filled 2021-09-24: qty 1

## 2021-09-24 MED ORDER — HYDROCORTISONE 1 % EX CREA
TOPICAL_CREAM | Freq: Once | CUTANEOUS | Status: AC
  Filled 2021-09-24: qty 28

## 2021-09-24 MED ORDER — LORATADINE 10 MG PO TABS
10.0000 mg | ORAL_TABLET | Freq: Once | ORAL | Status: AC
  Administered 2021-09-24: 10 mg via ORAL
  Filled 2021-09-24: qty 1

## 2021-09-24 MED ORDER — METRONIDAZOLE 500 MG PO TABS: 500.0000 mg | ORAL_TABLET | Freq: Two times a day (BID) | ORAL | 0 refills | Status: DC

## 2021-09-24 NOTE — MAU Note (Signed)
I had a c/s on January 11 and I have had brown pus coming out of my incision. My boyfriend and I had sex tonight and there is a foul odor down there and the lips are swollen down there. Having brown vag d/c

## 2021-09-24 NOTE — MAU Provider Note (Signed)
History     CSN: [id number]  Arrival date and time: 09/24/21 0334   Event Date/Time   First Provider Initiated Contact with Patient 09/24/21 [phone]      No chief complaint on file.  Melissa [name] is a 21 y.o. G3P2012 at 3 weeks PP from a Primary C/S s/t Initial HSV Outbreak who receives care at [facility].  She presents today for incisional pain.  She states she has been having incisional pain since this morning and noticed it "was puffy and had brown stuff coming out."  She states she was not able to make her incision check appt d/t her infant being in the hospital. She reports she is also having "a lot of pain" in her vagina and incisional area.  She reports cramping in her incision and pelvic pain in the vaginal area.  She reports she has not taken anything for her pain.  She rates the pain a 10/10 and reports it has no relieving factors, but sitting makes the pain worse in the pelvic area.  She reports sexual intercourse tonight with condom usage.  She states the condom was latex and questions if she is having an allergic reaction. She reports her incision has been hurting for one week despite pain medication usage (oxycodone and ibuprofen).  She reports taking pain medication two days ago.  She states she is currently taking her Valtrex. Patient reports vaginal discharge that is "brown smelly" for the past 5 days.  She states "it smells like something died."    OB History     Gravida  3   Para  2   Term  2   Preterm      AB  1   Living  2      SAB      IAB      Ectopic      Multiple  0   Live Births  2           Past Medical History:  Diagnosis Date   Anemia    Anxiety    Asthma    inhaler. last attack June 2019   Congenital hydronephrosis 2001   Constipation    Depression    Episodic tension-type headache, not intractable 04/16/2015   Family history of adverse reaction to anesthesia    mom rushed to hospital from Dentist office   Low back  strain, sequela 11/10/2020   Migraine without aura and without status migrainosus, not intractable 04/16/2015   PID (pelvic inflammatory disease)    Rh negative state in antepartum period 02/05/2019   Seizures ([place])    last one in 2015 - on meds   Sickle cell trait ([name])    TBI (traumatic brain injury) 2006    Past Surgical History:  Procedure Laterality Date   APPENDECTOMY     CESAREAN SECTION N/A 08/31/2021   Procedure: CESAREAN SECTION;  Surgeon: Gwynne Edinger, MD;  Location: [facility];  Service: Obstetrics;  Laterality: N/A;   HERNIA REPAIR     INTESTINAL MALROTATION REPAIR  2001    Family History  Problem Relation Age of Onset   Depression Mother    Stroke Mother    Obesity Mother    Post-traumatic stress disorder Mother    Anxiety disorder Mother    Hypertension Mother    Diabetes Mother    Schizophrenia Father    Kidney disease Father     Social History   Tobacco Use   Smoking status: Every Day  Types: Cigarettes, Cigars   Smokeless tobacco: Never   Tobacco comments:    black and milds, none since + UPT  Vaping Use   Vaping Use: Never used  Substance Use Topics   Alcohol use: Never   Drug use: Not Currently    Types: Marijuana    Comment: last 2020    Allergies:  Allergies  Allergen Reactions   Benadryl [Diphenhydramine Hcl] Other (See Comments)    Causes seizures    Gluten Meal Nausea And Vomiting   Shellfish Allergy Anaphylaxis    Patient states her throat gets tight   Zithromax [Azithromycin] Anaphylaxis and Swelling   Lactose Intolerance (Gi) Diarrhea    Medications Prior to Admission  Medication Sig Dispense Refill Last Dose   acetaminophen (TYLENOL) 325 MG tablet Take 650 mg by mouth every 6 (six) hours as needed.   Past Week   acetaminophen (TYLENOL) 500 MG tablet Take 2 tablets (1,000 mg total) by mouth every 6 (six) hours. 30 tablet 0 Past Week   clotrimazole (LOTRIMIN) 1 % cream Apply 1 application topically 2 (two) times daily.  (Patient taking differently: Apply 1 application topically daily as needed (yeast infection).) 30 g 0 Past Week   docusate sodium (COLACE) 100 MG capsule Take 1 capsule (100 mg total) by mouth at bedtime. Start after completion of miralax. (Patient taking differently: Take 100 mg by mouth daily as needed for mild constipation or moderate constipation. Start after completion of miralax.) 60 capsule 0 Past Week   ferrous sulfate 325 (65 FE) MG tablet Take 325 mg by mouth daily with breakfast.   Past Week   ibuprofen (ADVIL) 800 MG tablet Take 1 tablet (800 mg total) by mouth 3 (three) times daily. 21 tablet 0 Past Week   oxyCODONE (OXY IR/ROXICODONE) 5 MG immediate release tablet Take 1 tablet (5 mg total) by mouth every 4 (four) hours as needed for moderate pain. 30 tablet 0 09/24/2021   polyethylene glycol (MIRALAX / GLYCOLAX) 17 g packet Take 17 g by mouth daily as needed for moderate constipation or mild constipation.   Past Week   Prenatal Vit-Fe Fumarate-FA (PREPLUS) 27-1 MG TABS Take 1 tablet by mouth daily. 30 tablet 13 09/24/2021   valACYclovir (VALTREX) 1000 MG tablet Take 1,000 mg by mouth 2 (two) times daily.   09/24/2021    Review of Systems  Constitutional:  Positive for fever (99.9 Yesterday). Negative for chills.  Eyes:  Negative for visual disturbance.  Gastrointestinal:  Positive for abdominal pain and constipation (BM x 4 days, hard to pass). Negative for diarrhea, nausea and vomiting.  Genitourinary:  Positive for dysuria (Today after sex) and vaginal discharge ("Brown smelly"). Negative for difficulty urinating.  Neurological:  Negative for dizziness, light-headedness and headaches.  Physical Exam   Blood pressure 113/79, pulse 98, temperature 98.2 F (36.8 C), resp. rate 17, height 5\' 7"  (1.702 m), weight 96.2 kg, SpO2 99 %, unknown if currently breastfeeding.  Physical Exam Vitals reviewed. Exam conducted with a chaperone present.  Constitutional:      Appearance: Normal  appearance.  HENT:     Head: Normocephalic and atraumatic.  Eyes:     Conjunctiva/sclera: Conjunctivae normal.  Abdominal:     General: Bowel sounds are normal.     Palpations: Abdomen is soft.     Tenderness: There is no abdominal tenderness.  Genitourinary:    Labia:        Right: Tenderness present.        Left: Tenderness  present.      Vagina: No tenderness.     Comments: Bilateral edema of labial minora.  BME no CMT or discharge noted on exam glove.  Skin:    General: Skin is warm and dry.     Comments: LT Incision-Unremarkable. Well approximated.  No erythema, discharge, or edema noted.   Neurological:     Mental Status: She is alert and oriented to person, place, and time.  Psychiatric:        Mood and Affect: Mood normal.        Behavior: Behavior normal.        Thought Content: Thought content normal.     MAU Course  Procedures Results for orders placed or performed during the hospital encounter of 09/24/21 (from the past 24 hour(s))  Urinalysis, Routine w reflex microscopic Urine, Clean Catch     Status: None   Collection Time: 09/24/21  4:08 AM  Result Value Ref Range   Color, Urine YELLOW YELLOW   APPearance CLEAR CLEAR   Specific Gravity, Urine 1.010 1.005 - 1.030   pH 6.0 5.0 - 8.0   Glucose, UA NEGATIVE NEGATIVE mg/dL   Hgb urine dipstick NEGATIVE NEGATIVE   Bilirubin Urine NEGATIVE NEGATIVE   Ketones, ur NEGATIVE NEGATIVE mg/dL   Protein, ur NEGATIVE NEGATIVE mg/dL   Nitrite NEGATIVE NEGATIVE   Leukocytes,Ua NEGATIVE NEGATIVE    MDM Exam Medication Pharmacy Consult Assessment and Plan  21 year old 3 Weeks Postpartum Labial Edema  -Exam performed. -POC Reviewed. -Patient reports history of seizures with benadryl usage, but unsure of usage of Claritin or zyrtec in the past. -Provider consults with Darlin Priestly, MD regarding pain medication and antihistamine dosing. Recommends consulting with pharmacy. Fransico Michael [facility] consulted and advised *Consider  pepcid *Reviewed previous meds and Claritin taken. Give Claritin. *Consider hydrocortisone cream for labial area. *Informs provider that cream would have to be applied under jelly as they are not compatible. -Will order medications per recommendation.   Maryann Conners 09/24/2021, 5:02 AM   Reassessment (5:40 AM)  -Patient reports improvement of pain with lidocaine jelly. -Will withhold pain medication at current. -Discussed management at home. -Patient agreeable.  -Also discussed treatment with flagyl for report of vaginal discharge with odor. -Rx for metronidazole sent to pharmacy on file.   Reassessment (6:17 AM)  -Patient reports continued improvement in pain. -Reports some anxiety about labial swelling and questions if she will die.  -Reassured that labial swelling will likely not be the cause of her death! -Discussed anxiety and patient reports having appt with LCSW at [place]. -Precautions given. -Encouraged to call primary office or return to MAU if symptoms worsen or with the onset of new symptoms. -Discharged to home in stable condition.  Maryann Conners MSN, CNM Advanced Practice Provider, Center for [employer]

## 2021-10-03 ENCOUNTER — Telehealth: Payer: Self-pay

## 2021-10-03 NOTE — Telephone Encounter (Signed)
Called pt after family connects nurse called about Edinburgh score of 15, no answer, left vm. Pt has appt in office tomorrow

## 2021-10-04 ENCOUNTER — Ambulatory Visit: Payer: Medicaid Other | Admitting: Obstetrics and Gynecology

## 2021-10-04 NOTE — Progress Notes (Deleted)
° ° °  Post Partum Visit Note  Melissa [name] is a 21 y.o. G71P2012 female who presents for a postpartum visit. She is 4 weeks postpartum following a primary cesarean section.  I have fully reviewed the prenatal and intrapartum course. The delivery was at [redacted]w[redacted]d gestational weeks.  Anesthesia: spinal. Postpartum course has been ***. Baby is doing well***. Baby is feeding by {breast/bottle:69}. Bleeding {vag bleed:12292}. Bowel function is {normal:32111}. Bladder function is {normal:32111}. Patient {is/is not:9024} sexually active. Contraception method is {contraceptive method:5051}. Postpartum depression screening: {gen negative/positive:315881}.   The pregnancy intention screening data noted above was reviewed. Potential methods of contraception were discussed. The patient elected to proceed with No data recorded.    Health Maintenance Due  Topic Date Due   COVID-19 Vaccine (1) Never done   HPV VACCINES (3 - 3-dose series) 08/05/2018    {Common ambulatory SmartLinks:19316}  Review of Systems {ros; complete:30496}  Objective:  There were no vitals taken for this visit.   General:  {gen appearance:16600}   Breasts:  {desc; normal/abnormal/not indicated:14647}  Lungs: {lung exam:16931}  Heart:  {heart exam:5510}  Abdomen: {abdomen exam:16834}   Wound {Wound assessment:11097}  GU exam:  {desc; normal/abnormal/not indicated:14647}       Assessment:    There are no diagnoses linked to this encounter.  *** postpartum exam.   Plan:   Essential components of care per ACOG recommendations:  1.  Mood and well being: Patient with {gen negative/positive:315881} depression screening today. Reviewed local resources for support.  - Patient tobacco use? {tobacco use:25506}  - hx of drug use? {yes/no:25505}    2. Infant care and feeding:  -Patient currently breastmilk feeding? {yes/no:25502}  -Social determinants of health (SDOH) reviewed in EPIC. No concerns***The following needs  were identified***  3. Sexuality, contraception and birth spacing - Patient {DOES_DOES CVE:93810} want a pregnancy in the next year.  Desired family size is {NUMBER 1-10:22536} children.  - Reviewed forms of contraception in tiered fashion. Patient desired {PLAN CONTRACEPTION:313102} today.   - Discussed birth spacing of 18 months  4. Sleep and fatigue -Encouraged family/partner/community support of 4 hrs of uninterrupted sleep to help with mood and fatigue  5. Physical Recovery  - Discussed patients delivery and complications. She describes her labor as {description:25511} - Patient had a {CHL AMB DELIVERY:[phone]}. Patient had a {laceration:25518} laceration. Perineal healing reviewed. Patient expressed understanding - Patient has urinary incontinence? {yes/no:25515} - Patient {ACTION; IS/IS FBP:10258527} safe to resume physical and sexual activity  6.  Health Maintenance - HM due items addressed {Yes or If no, why not?:20788} - Last pap smear  Diagnosis  Date Value Ref Range Status  11/19/2020   Final   - Negative for intraepithelial lesion or malignancy (NILM)   Pap smear {done:10129} at today's visit.  -Breast Cancer screening indicated? {indicated:25516}  7. Chronic Disease/Pregnancy Condition follow up: {Follow up:25499}  - PCP follow up  Vidal Schwalbe, CMA Center for [employer], [facility]

## 2021-10-06 ENCOUNTER — Other Ambulatory Visit: Payer: Self-pay

## 2021-10-06 ENCOUNTER — Encounter (HOSPITAL_COMMUNITY): Payer: Self-pay | Admitting: Emergency Medicine

## 2021-10-06 ENCOUNTER — Emergency Department (HOSPITAL_COMMUNITY)
Admission: EM | Admit: 2021-10-06 | Discharge: 2021-10-06 | Discharge status: Against Medical Advice | Payer: Medicaid Other | Attending: Emergency Medicine | Admitting: Emergency Medicine

## 2021-10-06 DIAGNOSIS — R222 Localized swelling, mass and lump, trunk: Secondary | ICD-10-CM | POA: Diagnosis not present

## 2021-10-06 DIAGNOSIS — Z5321 Procedure and treatment not carried out due to patient leaving prior to being seen by health care provider: Secondary | ICD-10-CM | POA: Insufficient documentation

## 2021-10-06 DIAGNOSIS — Z20822 Contact with and (suspected) exposure to covid-19: Secondary | ICD-10-CM | POA: Diagnosis not present

## 2021-10-06 DIAGNOSIS — R630 Anorexia: Secondary | ICD-10-CM | POA: Diagnosis not present

## 2021-10-06 LAB — URINALYSIS, ROUTINE W REFLEX MICROSCOPIC
Bilirubin Urine: NEGATIVE
Glucose, UA: NEGATIVE mg/dL
Hgb urine dipstick: NEGATIVE
Ketones, ur: NEGATIVE mg/dL
Leukocytes,Ua: NEGATIVE
Nitrite: NEGATIVE
Protein, ur: NEGATIVE mg/dL
Specific Gravity, Urine: 1.009 (ref 1.005–1.030)
pH: 8 (ref 5.0–8.0)

## 2021-10-06 LAB — RESP PANEL BY RT-PCR (FLU A&B, COVID) ARPGX2
Influenza A by PCR: NEGATIVE
Influenza B by PCR: NEGATIVE
SARS Coronavirus 2 by RT PCR: NEGATIVE

## 2021-10-06 LAB — RPR: RPR Ser Ql: NONREACTIVE

## 2021-10-06 LAB — HIV ANTIBODY (ROUTINE TESTING W REFLEX): HIV Screen 4th Generation wRfx: NONREACTIVE

## 2021-10-06 LAB — PREGNANCY, URINE: Preg Test, Ur: NEGATIVE

## 2021-10-06 LAB — GROUP A STREP BY PCR: Group A Strep by PCR: NOT DETECTED

## 2021-10-06 NOTE — ED Provider Triage Note (Signed)
Emergency Medicine Provider Triage Evaluation Note  Braley Luckenbaugh Kellam-Wallace , a 21 y.o. female  was evaluated in triage.  Pt with mult complaints. Reports she feels like her throat is swollen and has had an abnormal sensation when swallowing. She has also had sweats and chills. She further reports vaginal discharge and cramping. Reports hx gonorrhea that also affected her throat and she feels that the sxs are similar. Had unprotected intercourse a week ago.  Review of Systems  Positive: Throat discomfort, vaginal discharge, sweats, chills Negative: sob  Physical Exam  BP (!) 134/101 (BP Location: Right Arm)    Pulse 96    Temp 98.7 F (37.1 C) (Oral)    Resp 20    SpO2 100%  Gen:   Awake, no distress   Resp:  Normal effort  MSK:   Moves extremities without difficulty  Other:  No unilateral tonsillar edema or exudate, no LAD, lungs ctab  Medical Decision Making  Medically screening exam initiated at 12:33 AM.  Appropriate orders placed.  Tyrica Afzal Kellam-Wallace was informed that the remainder of the evaluation will be completed by another provider, this initial triage assessment does not replace that evaluation, and the importance of remaining in the ED until their evaluation is complete.     Karrie Meres, [place] 10/06/21 [phone]

## 2021-10-06 NOTE — ED Notes (Signed)
Called for vitals x2 

## 2021-10-06 NOTE — ED Triage Notes (Signed)
Pt reported to ED with c/op "throat swelling" and sore throat x1 week. Pt states that she had episode earlier in the night where she felt as if she could not breathe. Also endorses lack of appetite recently, state she feels as if something is in throat.

## 2021-10-07 ENCOUNTER — Encounter (HOSPITAL_COMMUNITY): Payer: Self-pay | Admitting: *Deleted

## 2021-10-07 ENCOUNTER — Ambulatory Visit (HOSPITAL_COMMUNITY)
Admission: EM | Admit: 2021-10-07 | Discharge: 2021-10-07 | Disposition: A | Discharge status: Home / Self Care | Payer: Medicaid Other | Attending: Family Medicine | Admitting: Family Medicine

## 2021-10-07 DIAGNOSIS — R0989 Other specified symptoms and signs involving the circulatory and respiratory systems: Secondary | ICD-10-CM | POA: Diagnosis not present

## 2021-10-07 DIAGNOSIS — J301 Allergic rhinitis due to pollen: Secondary | ICD-10-CM | POA: Diagnosis not present

## 2021-10-07 DIAGNOSIS — J4521 Mild intermittent asthma with (acute) exacerbation: Secondary | ICD-10-CM | POA: Diagnosis not present

## 2021-10-07 HISTORY — DX: Unspecified asthma, uncomplicated: J45.909

## 2021-10-07 HISTORY — DX: Herpesviral infection of urogenital system, unspecified: A60.00

## 2021-10-07 MED ORDER — PREDNISONE 20 MG PO TABS: 40.0000 mg | ORAL_TABLET | Freq: Every day | ORAL | 0 refills | Status: AC

## 2021-10-07 MED ORDER — ALBUTEROL SULFATE HFA 108 (90 BASE) MCG/ACT IN AERS: 1.0000 | INHALATION_SPRAY | RESPIRATORY_TRACT | 0 refills | Status: DC | PRN

## 2021-10-07 MED ORDER — FAMOTIDINE 20 MG PO TABS: 20.0000 mg | ORAL_TABLET | Freq: Two times a day (BID) | ORAL | 0 refills | Status: DC

## 2021-10-07 NOTE — ED Provider Notes (Signed)
[facility]    CSN: [id number] Arrival date & time: 10/07/21  1702      History   Chief Complaint Chief Complaint  Patient presents with   Throat Problem    HPI Melissa [name] is a 21 y.o. female.   HPI Here for a several week history of feeling like a ball is in her throat.  It will be intermittent.  She is not really having any pain in her throat.  She in the last couple of days is noted extra wheezing; she does have a history of asthma, and she is out of her inhaler.  No burping or acid reflux symptoms that she notes.  No fever or chills.  She has had some itching in her eyes and throat and nose; Claritin does help that  She was seen in the emergency room yesterday.  Testing for strep and COVID were negative.  She states that the gonorrhea test is pending  Past Medical History:  Diagnosis Date   Anemia    Anxiety    Asthma    inhaler. last attack June 2019   Congenital hydronephrosis 2001   Constipation    Depression    Episodic tension-type headache, not intractable 04/16/2015   Family history of adverse reaction to anesthesia    mom rushed to hospital from Dentist office   Genital herpes    Low back strain, sequela 11/10/2020   Migraine without aura and without status migrainosus, not intractable 04/16/2015   PID (pelvic inflammatory disease)    Reactive airway disease in pediatric patient    Rh negative state in antepartum period 02/05/2019   Seizures ([name])    last one in 2015 - on meds   Sickle cell trait ([name])    TBI (traumatic brain injury) 2006    Patient Active Problem List   Diagnosis Date Noted   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   History of primary cesarean section 08/31/2021   Status post primary low transverse cesarean section 08/31/2021   [redacted] weeks gestation of pregnancy 08/18/2021   HSV infection 08/11/2021   Supervision of low-risk pregnancy 02/09/2021   Abnormal involuntary movements 08/04/2020   Migraine with aura and  without status migrainosus, not intractable 08/12/2019   Seizures ([name])    Sickle cell trait ([name]) 02/13/2019   Rh negative, antepartum 02/05/2019   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   DMDD (disruptive mood dysregulation disorder) ([name]) 07/21/2016   MDD (major depressive disorder), recurrent severe, 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   Chronic constipation 08/03/2015   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 Authorizing Provider  albuterol (VENTOLIN HFA) 108 (90 Base) MCG/ACT inhaler Inhale 1-2 puffs into the lungs every 4 (four) hours as needed for wheezing or shortness of breath. 10/07/21  Yes Barrett Henle, MD  famotidine (PEPCID) 20 MG tablet Take 1 tablet (20 mg total) by mouth 2 (two) times daily. 10/07/21  Yes Barrett Henle, MD  loratadine (CLARITIN) 10 MG tablet Take 1 tablet (10 mg total) by mouth daily. 09/25/21  Yes Gavin Pound, CNM  predniSONE (DELTASONE) 20 MG tablet Take 2 tablets (40 mg total) by mouth daily with breakfast for 5 days. 10/07/21 10/12/21 Yes Barrett Henle, MD  acetaminophen (TYLENOL) 325 MG tablet Take 650 mg by mouth every 6 (six) hours as needed.    [provider]  acetaminophen (TYLENOL) 500 MG tablet Take 2 tablets (1,000 mg total) by  mouth every 6 (six) hours. 09/03/21   Renee Harder, CNM  docusate sodium (COLACE) 100 MG capsule Take 1 capsule (100 mg total) by mouth at bedtime. Start after completion of miralax. Patient taking differently: Take 100 mg by mouth daily as needed for mild constipation or moderate constipation. Start after completion of miralax. 05/07/21   Gavin Pound, CNM  ferrous sulfate 325 (65 FE) MG tablet Take 325 mg by mouth daily with breakfast.    [provider]  ibuprofen (ADVIL) 800 MG tablet Take 1 tablet (800 mg total) by mouth 3 (three) times daily. 09/06/21   Jaynee Eagles, PA-C  polyethylene glycol (MIRALAX / GLYCOLAX) 17 g packet Take 17 g by mouth daily as needed for moderate constipation or mild constipation.    [provider]  Prenatal Vit-Fe Fumarate-FA (PREPLUS) 27-1 MG TABS Take 1 tablet by mouth daily. 02/09/21   Shelly Bombard, MD  valACYclovir (VALTREX) 1000 MG tablet Take 1,000 mg by mouth 2 (two) times daily.    [provider]  rizatriptan (MAXALT-MLT) 10 MG disintegrating tablet Take 1 tablet at onset of migraine with 2 ibuprofen may repeat an additional tablet in 2 hours if needed 05/07/20 06/09/20  Jodi Geralds, MD    Family History Family History  Problem Relation Age of Onset   Depression Mother    Stroke Mother    Obesity Mother    Post-traumatic stress disorder Mother    Anxiety disorder Mother    Hypertension Mother    Diabetes Mother    Schizophrenia Father    Kidney disease Father     Social History Social History   Tobacco Use   Smoking status: Every Day    Types: Cigarettes   Smokeless tobacco: Never   Tobacco comments:    black and milds, none since + UPT  Vaping Use   Vaping Use: Never used  Substance Use Topics   Alcohol use: Yes    Comment: occasionally   Drug use: Not Currently    Types: Marijuana    Comment: last 2020     Allergies   Benadryl [diphenhydramine hcl], Gluten meal, Shellfish allergy,  Zithromax [azithromycin], Lactose intolerance (gi), and Latex   Review of Systems Review of Systems   Physical Exam Triage Vital Signs ED Triage Vitals  Enc Vitals Group     BP 10/07/21 1722 124/81     Pulse Rate 10/07/21 1722 86     Resp 10/07/21 1722 20     Temp 10/07/21 1722 98.2 F (36.8 C)     Temp  Source 10/07/21 1722 Oral     SpO2 10/07/21 1722 98 %     Weight --      Height --      Head Circumference --      Peak Flow --      Pain Score 10/07/21 1724 0     Pain Loc --      Pain Edu? --      Excl. in [place]? --    No data found.  Updated Vital Signs BP 124/81    Pulse 86    Temp 98.2 F (36.8 C) (Oral)    Resp 20    SpO2 98%    Breastfeeding No   Visual Acuity Right Eye Distance:   Left Eye Distance:   Bilateral Distance:    Right Eye Near:   Left Eye Near:    Bilateral Near:     Physical Exam Vitals reviewed.  Constitutional:      General: She is not in acute distress.    Appearance: She is not toxic-appearing.  HENT:     Right Ear: Tympanic membrane and ear canal normal.     Left Ear: Tympanic membrane and ear canal normal.     Nose: Nose normal.     Mouth/Throat:     Mouth: Mucous membranes are moist.     Pharynx: No oropharyngeal exudate or posterior oropharyngeal erythema.  Eyes:     Extraocular Movements: Extraocular movements intact.     Conjunctiva/sclera: Conjunctivae normal.     Pupils: Pupils are equal, round, and reactive to light.  Cardiovascular:     Rate and Rhythm: Normal rate and regular rhythm.     Heart sounds: No murmur heard. Pulmonary:     Effort: Pulmonary effort is normal. No respiratory distress.     Breath sounds: No wheezing, rhonchi or rales.  Musculoskeletal:     Cervical back: Neck supple.  Lymphadenopathy:     Cervical: No cervical adenopathy.  Skin:    Capillary Refill: Capillary refill takes less than 2 seconds.     Coloration: Skin is not jaundiced or pale.  Neurological:     General: No focal deficit  present.     Mental Status: She is alert and oriented to person, place, and time.  Psychiatric:        Behavior: Behavior normal.     UC Treatments / Results  Labs (all labs ordered are listed, but only abnormal results are displayed) Labs Reviewed - No data to display  EKG   Radiology No results found.  Procedures Procedures (including critical care time)  Medications Ordered in UC Medications - No data to display  Initial Impression / Assessment and Plan / UC Course  I have reviewed the triage vital signs and the nursing notes.  Pertinent labs & imaging results that were available during my care of the patient were reviewed by me and considered in my medical decision making (see chart for details).     Discussed this could be allergy and asthma exacerbation.  Also discussed that she could be having some silent reflux with a globus sensation and what that was.  She will begin taking her Claritin, and she will take 5 days of prednisone and use her inhaler as needed.  If not improving then she may go to take some famotidine twice daily Final Clinical Impressions(s) / UC Diagnoses   Final diagnoses:  Allergic rhinitis due to pollen, unspecified seasonality  Mild intermittent asthma with acute exacerbation  Globus  sensation     Discharge Instructions      Take loratadine/Claritin 10 mg 1 daily for allergies   take prednisone 20 mg 2 daily for the inflammation in your lungs  Also sent in famotidine or Pepcid.  If taking the prednisone and the Claritin does not help the ball sensation in your throat, can start taking famotidine twice daily       ED Prescriptions     Medication Sig Dispense Auth. Provider   predniSONE (DELTASONE) 20 MG tablet Take 2 tablets (40 mg total) by mouth daily with breakfast for 5 days. 10 tablet Barrett Henle, MD   famotidine (PEPCID) 20 MG tablet Take 1 tablet (20 mg total) by mouth 2 (two) times daily. 30 tablet Elliot Meldrum, Gwenlyn Perking, MD   albuterol (VENTOLIN HFA) 108 (90 Base) MCG/ACT inhaler Inhale 1-2 puffs into the lungs every 4 (four) hours as needed for wheezing or shortness of breath. 1 each Barrett Henle, MD      PDMP not reviewed this encounter.   Barrett Henle, MD 10/07/21 1759

## 2021-10-07 NOTE — ED Triage Notes (Addendum)
C/O sensation of "ball in throat" intermittently over past 2 wks. Denies pain, but states it feels swollen at times, especially with any strenous activity or being around smoke. Also c/o occasional wheezing, but states does not have any inhalers left. Pt was seen in ED yesterday and had labs done, but left prior to provider eval due to wait.

## 2021-10-07 NOTE — Discharge Instructions (Addendum)
Take loratadine/Claritin 10 mg 1 daily for allergies   take prednisone 20 mg 2 daily for the inflammation in your lungs  Also sent in famotidine or Pepcid.  If taking the prednisone and the Claritin does not help the ball sensation in your throat, can start taking famotidine twice daily

## 2021-11-02 ENCOUNTER — Emergency Department (HOSPITAL_COMMUNITY)
Admission: EM | Admit: 2021-11-02 | Discharge: 2021-11-02 | Disposition: A | Discharge status: Home / Self Care | Payer: Medicaid Other | Attending: Emergency Medicine | Admitting: Emergency Medicine

## 2021-11-02 ENCOUNTER — Encounter (HOSPITAL_COMMUNITY): Payer: Self-pay

## 2021-11-02 ENCOUNTER — Other Ambulatory Visit: Payer: Self-pay

## 2021-11-02 DIAGNOSIS — F410 Panic disorder [episodic paroxysmal anxiety] without agoraphobia: Secondary | ICD-10-CM | POA: Diagnosis not present

## 2021-11-02 DIAGNOSIS — Z91040 Latex allergy status: Secondary | ICD-10-CM | POA: Insufficient documentation

## 2021-11-02 DIAGNOSIS — J45909 Unspecified asthma, uncomplicated: Secondary | ICD-10-CM | POA: Diagnosis not present

## 2021-11-02 DIAGNOSIS — R000 Tachycardia, unspecified: Secondary | ICD-10-CM | POA: Insufficient documentation

## 2021-11-02 MED ORDER — HYDROXYZINE HCL 25 MG PO TABS
25.0000 mg | ORAL_TABLET | Freq: Once | ORAL | Status: AC
  Administered 2021-11-02: 25 mg via ORAL
  Filled 2021-11-02: qty 1

## 2021-11-02 MED ORDER — HYDROXYZINE HCL 25 MG PO TABS: 25.0000 mg | ORAL_TABLET | Freq: Four times a day (QID) | ORAL | 0 refills | Status: DC | PRN

## 2021-11-02 NOTE — Discharge Instructions (Signed)
I suspect you had a panic attack today.  Sometimes the side effects from albuterol can make this worse.  Please use hydroxyzine as needed for severe anxiety and panic.  I think he would benefit from following up with the behavioral health center for further evaluation and management of your anxiety.  They have walk-in hours. ? ?Return for new or worsening symptoms. ?

## 2021-11-02 NOTE — ED Triage Notes (Signed)
Pt BIB EMS from [place] bus stop. Pt had a panic attack after running to the bus stop. Pt called out for asthma attack and attempted to use inhaler. Denies any pain.  ? ?HR 150 to 120 ?RR 44 to 22 ?100% RA ?BP 148/98 ? ?

## 2021-11-02 NOTE — ED Provider Notes (Signed)
?[facility]-EMERGENCY DEPT ?Provider Note ? ? ?CSN: [id number] ?Arrival date & time: 11/02/21  1046 ? ?  ? ?History ? ?Chief Complaint  ?Patient presents with  ? Panic Attack  ? ? ?Melissa [name] is a 21 y.o. female. ? ?Melissa [name] is a 21 y.o. female with a previous history of asthma, anxiety, depression, TBI, seizures, migraines, who presents to the emergency department via EMS for evaluation of asthma and panic attack.  Patient reports she was leaving work and had to run to catch the bus before it left and then started to feel like she was having an asthma attack.  She reports she felt tightness in her chest and like she was having difficulty breathing.  She reports she came to using her asthma inhaler but did not feel like it was helping and then started to panic.  She had asked the bus driver to stop the bus and then when EMS arrived she was noted to be tachycardic with heart rates ranging in the 120s-150s and increased.  Patient reports this episode appears very similar to when she has previously had panic attacks, but it has been a while since she has had one.  Patient reports that since she has been able to sit down her symptoms seem to be slowly improving but she is still having some palpitations and reports she feels very jittery.  Does report that she used her asthma inhaler about 15 times prior to arrival.  Currently denies chest pain.  No cough, fevers or chills.  No abdominal pain, nausea, vomiting, diarrhea. ? ?The history is provided by the patient and the EMS personnel.  ? ?  ? ?Home Medications ?Prior to Admission medications   ?Medication Sig Start Date End Date Taking? Authorizing Provider  ?hydrOXYzine (ATARAX) 25 MG tablet Take 1 tablet (25 mg total) by mouth every 6 (six) hours as needed for anxiety. 11/02/21  Yes Dartha Lodge, PA-C  ?acetaminophen (TYLENOL) 325 MG tablet Take 650 mg by mouth every 6 (six) hours as needed.    [provider]   ?acetaminophen (TYLENOL) 500 MG tablet Take 2 tablets (1,000 mg total) by mouth every 6 (six) hours. 09/03/21   Brand Males, CNM  ?albuterol (VENTOLIN HFA) 108 (90 Base) MCG/ACT inhaler Inhale 1-2 puffs into the lungs every 4 (four) hours as needed for wheezing or shortness of breath. 10/07/21   Zenia Resides, MD  ?docusate sodium (COLACE) 100 MG capsule Take 1 capsule (100 mg total) by mouth at bedtime. Start after completion of miralax. ?Patient taking differently: Take 100 mg by mouth daily as needed for mild constipation or moderate constipation. Start after completion of miralax. 05/07/21   Gerrit Heck, CNM  ?famotidine (PEPCID) 20 MG tablet Take 1 tablet (20 mg total) by mouth 2 (two) times daily. 10/07/21   Zenia Resides, MD  ?ferrous sulfate 325 (65 FE) MG tablet Take 325 mg by mouth daily with breakfast.    [provider]  ?ibuprofen (ADVIL) 800 MG tablet Take 1 tablet (800 mg total) by mouth 3 (three) times daily. 09/06/21   Wallis Bamberg, PA-C  ?loratadine (CLARITIN) 10 MG tablet Take 1 tablet (10 mg total) by mouth daily. 09/25/21   Gerrit Heck, CNM  ?polyethylene glycol (MIRALAX / GLYCOLAX) 17 g packet Take 17 g by mouth daily as needed for moderate constipation or mild constipation.    [provider]  ?Prenatal Vit-Fe Fumarate-FA (PREPLUS) 27-1 MG TABS Take 1 tablet  by mouth daily. 02/09/21   Brock BadHarper, Charles A, MD  ?valACYclovir (VALTREX) 1000 MG tablet Take 1,000 mg by mouth 2 (two) times daily.    [provider]  ?rizatriptan (MAXALT-MLT) 10 MG disintegrating tablet Take 1 tablet at onset of migraine with 2 ibuprofen may repeat an additional tablet in 2 hours if needed 05/07/20 06/09/20  Deetta PerlaHickling, William H, MD  ?   ? ?Allergies    ?Benadryl [diphenhydramine hcl], Gluten meal, Shellfish allergy, Zithromax [azithromycin], Lactose intolerance (gi), and Latex   ? ?Review of Systems   ?Review of Systems  ?Constitutional:  Negative for chills and fever.   ?Respiratory:  Positive for chest tightness, shortness of breath and wheezing. Negative for cough.   ?Cardiovascular:  Positive for palpitations. Negative for chest pain.  ?Gastrointestinal:  Negative for abdominal pain, nausea and vomiting.  ?Neurological:  Negative for syncope.  ?Psychiatric/Behavioral:  The patient is nervous/anxious.   ?All other systems reviewed and are negative. ? ?Physical Exam ?Updated Vital Signs ?BP (!) 151/91 (BP Location: Left Arm)   Pulse (!) 112   Temp 98.1 ?F (36.7 ?C) (Oral)   Resp 20   SpO2 97%  ?Physical Exam ?Vitals and nursing note reviewed.  ?Constitutional:   ?   General: She is not in acute distress. ?   Appearance: Normal appearance. She is well-developed. She is not ill-appearing or diaphoretic.  ?HENT:  ?   Head: Normocephalic and atraumatic.  ?Eyes:  ?   General:     ?   Right eye: No discharge.     ?   Left eye: No discharge.  ?Cardiovascular:  ?   Rate and Rhythm: Regular rhythm. Tachycardia present.  ?   Pulses: Normal pulses.  ?   Heart sounds: Normal heart sounds.  ?Pulmonary:  ?   Effort: Pulmonary effort is normal. No respiratory distress.  ?   Breath sounds: Normal breath sounds.  ?   Comments: Respirations equal and unlabored, patient able to speak in full sentences, lungs clear to auscultation bilaterally, no wheezing ?Abdominal:  ?   General: Bowel sounds are normal. There is no distension.  ?   Palpations: Abdomen is soft. There is no mass.  ?   Tenderness: There is no abdominal tenderness. There is no guarding.  ?Musculoskeletal:     ?   General: No deformity.  ?Skin: ?   General: Skin is warm and dry.  ?Neurological:  ?   Mental Status: She is alert and oriented to person, place, and time.  ?   Coordination: Coordination normal.  ?Psychiatric:     ?   Mood and Affect: Mood normal.     ?   Behavior: Behavior normal.  ? ? ?ED Results / Procedures / Treatments   ?Labs ?(all labs ordered are listed, but only abnormal results are displayed) ?Labs Reviewed -  No data to display ? ?EKG ?None ? ?Radiology ?No results found. ? ?Procedures ?Procedures  ? ? ?Medications Ordered in ED ?Medications  ?hydrOXYzine (ATARAX) tablet 25 mg (25 mg Oral Given 11/02/21 1136)  ? ? ?ED Course/ Medical Decision Making/ A&P ?  ?                        ?This patient presents to the ED for concern of Panic attack, shortness of breath, this involves an extensive number of treatment options, and is a complaint that carries with it a high risk of complications and morbidity.  The differential  diagnosis includes anxiety, panic attack (pt with hx of same), asthma attack, arrhythmia ? ? ?Co morbidities that complicate the patient evaluation ? ?Anxiety, depression, astha, migraines, TBI ? ? ?Additional history obtained: ? ?External records from outside source obtained and reviewed including previous ED encoutners ? ? ?Cardiac Monitoring: ? ?The patient was maintained on a cardiac monitor.  I personally viewed and interpreted the cardiac monitored which showed an underlying rhythm of: Sinus tachycardia, resolved with treatment in the ED, HR 90 on re-evaluation ? ? ?Medicines ordered and prescription drug management: ? ?I ordered medication including Hydroxyzine  for anxiety  ?Reevaluation of the patient after these medicines showed that the patient improved ?I have reviewed the patients home medicines and have made adjustments as needed ? ? ?Test Considered: ? ?Considered EKG, labs and CXR, but given that pt has clear lungs without any increased work of breathing, and symptoms are already improving and feel like her prior  ? ? ?Problem List / ED Course: ? ?Patient became short of breath and then extremely anxious and used several puffs of albuterol without improvement.  Symptoms are consistent with her prior panic attacks.  On arrival symptoms are already starting to improve and patient has clear lungs without wheezing or decreased air movement to suggest underlying asthma exacerbation. ?Treated with  hydroxyzine for anxiety with significant improvement, heart rate returned to normal and patient is feeling much better.  Do not feel that further work-up would change treatment at this time.  Will discharge with hydro

## 2021-11-04 ENCOUNTER — Ambulatory Visit (HOSPITAL_COMMUNITY)
Admission: EM | Admit: 2021-11-04 | Discharge: 2021-11-04 | Disposition: A | Discharge status: Home / Self Care | Payer: Medicaid Other | Attending: Family Medicine | Admitting: Family Medicine

## 2021-11-04 ENCOUNTER — Other Ambulatory Visit: Payer: Self-pay

## 2021-11-04 ENCOUNTER — Encounter (HOSPITAL_COMMUNITY): Payer: Self-pay

## 2021-11-04 DIAGNOSIS — J309 Allergic rhinitis, unspecified: Secondary | ICD-10-CM | POA: Diagnosis not present

## 2021-11-04 DIAGNOSIS — J029 Acute pharyngitis, unspecified: Secondary | ICD-10-CM | POA: Insufficient documentation

## 2021-11-04 LAB — POCT RAPID STREP A, ED / UC: Streptococcus, Group A Screen (Direct): NEGATIVE

## 2021-11-04 NOTE — ED Provider Notes (Signed)
?[facility] ? ? ? ?CSN: [id number] ?Arrival date & time: 11/04/21  1355 ? ? ?  ? ?History   ?Chief Complaint ?Chief Complaint  ?Patient presents with  ? Shortness of Breath  ? Nausea  ? ? ?HPI ?Melissa [name] is a 21 y.o. female.  ? ?For the last 3 weeks she has been having intermittent throat swelling, feels like it is swollen, hard to breath.  Feels like there is an infection in her throat. She thinks it looks swollen.  Today it felt like it was numb, and hard to swallow.  She looked and the back was yellow.  ?She has been having chills, fever yesterday of 102.  ?Also with cough since yesterday.  Runny nose, congestion since last week.  ?She was in the ER 2 days ago for panic attack, asthma.  ?She has been using motrin, and albuterol, but that does not help with the throat swelling.  ?She states this happened since having intercourse with a new partner.  Oral intercourse.   Not sure about std exposure.  ?She is having brown vaginal d/c, itching.  ? ?Past Medical History:  ?Diagnosis Date  ? Anemia   ? Anxiety   ? Asthma   ? inhaler. last attack June 2019  ? Congenital hydronephrosis 2001  ? Constipation   ? Depression   ? Episodic tension-type headache, not intractable 04/16/2015  ? Family history of adverse reaction to anesthesia   ? mom rushed to hospital from Dentist office  ? Genital herpes   ? Low back strain, sequela 11/10/2020  ? Migraine without aura and without status migrainosus, not intractable 04/16/2015  ? PID (pelvic inflammatory disease)   ? Reactive airway disease in pediatric patient   ? Rh negative state in antepartum period 02/05/2019  ? Seizures ([name])   ? last one in 2015 - on meds  ? Sickle cell trait ([name])   ? TBI (traumatic brain injury) 2006  ? ? ?Patient Active Problem List  ? Diagnosis Date Noted  ? 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  ? History of primary cesarean section 08/31/2021  ? Status post primary low transverse cesarean section 08/31/2021  ? [redacted] weeks gestation of  pregnancy 08/18/2021  ? HSV infection 08/11/2021  ? Supervision of low-risk pregnancy 02/09/2021  ? Abnormal involuntary movements 08/04/2020  ? Migraine with aura and without status migrainosus, not intractable 08/12/2019  ? Seizures ([name])   ? Sickle cell trait ([name]) 02/13/2019  ? Rh negative, antepartum 02/05/2019  ? 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 DMDD (disruptive mood dysregulation disorder) ([name]) 07/21/2016  ? 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 Chronic constipation 08/03/2015  ? 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 GAD (generalized anxiety disorder) 11/25/2013  ? ? ?Past Surgical History:  ?Procedure Laterality Date  ? APPENDECTOMY    ? CESAREAN SECTION N/A 08/31/2021  ? Procedure: CESAREAN SECTION;  Surgeon: Kathrynn RunningWouk, Noah Bedford, MD;  Location: [facility];  Service: Obstetrics;  Laterality: N/A;  ? HERNIA REPAIR    ? INTESTINAL MALROTATION REPAIR  2001  ? ? ?OB History   ? ? Gravida  ?3  ? Para  ?2  ? Term  ?2  ? Preterm  ?   ? AB  ?1  ? Living  ?2  ?  ? ? SAB  ?   ? IAB  ?   ? Ectopic  ?   ?  Multiple  ?0  ? Live Births  ?2  ?   ?  ?  ? ? ? ?Home Medications   ? ?Prior to Admission medications   ?Medication Sig Start Date End Date Taking? Authorizing Provider  ?acetaminophen (TYLENOL) 325 MG tablet Take 650 mg by mouth every 6 (six) hours as needed.    [provider]  ?acetaminophen (TYLENOL) 500 MG tablet Take 2 tablets (1,000 mg total) by mouth every 6 (six) hours. 09/03/21   Brand Males, CNM  ?albuterol (VENTOLIN HFA) 108 (90 Base) MCG/ACT inhaler Inhale 1-2 puffs into the lungs every 4 (four) hours as needed for wheezing or shortness of breath. 10/07/21   Zenia Resides, MD  ?docusate sodium (COLACE) 100 MG capsule Take 1 capsule (100 mg total) by mouth at bedtime. Start after completion of miralax. ?Patient taking differently: Take 100 mg  by mouth daily as needed for mild constipation or moderate constipation. Start after completion of miralax. 05/07/21   Gerrit Heck, CNM  ?famotidine (PEPCID) 20 MG tablet Take 1 tablet (20 mg total) by mouth 2 (two) times daily. 10/07/21   Zenia Resides, MD  ?ferrous sulfate 325 (65 FE) MG tablet Take 325 mg by mouth daily with breakfast.    [provider]  ?hydrOXYzine (ATARAX) 25 MG tablet Take 1 tablet (25 mg total) by mouth every 6 (six) hours as needed for anxiety. 11/02/21   Dartha Lodge, PA-C  ?ibuprofen (ADVIL) 800 MG tablet Take 1 tablet (800 mg total) by mouth 3 (three) times daily. 09/06/21   Wallis Bamberg, PA-C  ?loratadine (CLARITIN) 10 MG tablet Take 1 tablet (10 mg total) by mouth daily. 09/25/21   Gerrit Heck, CNM  ?polyethylene glycol (MIRALAX / GLYCOLAX) 17 g packet Take 17 g by mouth daily as needed for moderate constipation or mild constipation.    [provider]  ?Prenatal Vit-Fe Fumarate-FA (PREPLUS) 27-1 MG TABS Take 1 tablet by mouth daily. 02/09/21   Brock Bad, MD  ?valACYclovir (VALTREX) 1000 MG tablet Take 1,000 mg by mouth 2 (two) times daily.    [provider]  ?rizatriptan (MAXALT-MLT) 10 MG disintegrating tablet Take 1 tablet at onset of migraine with 2 ibuprofen may repeat an additional tablet in 2 hours if needed 05/07/20 06/09/20  Deetta Perla, MD  ? ? ?Family History ?Family History  ?Problem Relation Age of Onset  ? Depression Mother   ? Stroke Mother   ? Obesity Mother   ? Post-traumatic stress disorder Mother   ? Anxiety disorder Mother   ? Hypertension Mother   ? Diabetes Mother   ? Schizophrenia Father   ? Kidney disease Father   ? ? ?Social History ?Social History  ? ?Tobacco Use  ? Smoking status: Every Day  ?  Types: Cigarettes  ? Smokeless tobacco: Never  ? Tobacco comments:  ?  black and milds, none since + UPT  ?Vaping Use  ? Vaping Use: Never used  ?Substance Use Topics  ? Alcohol use: Yes  ?  Comment: occasionally  ? Drug  use: Not Currently  ?  Types: Marijuana  ?  Comment: last 2020  ? ? ? ?Allergies   ?Benadryl [diphenhydramine hcl], Gluten meal, Shellfish allergy, Zithromax [azithromycin], Lactose intolerance (gi), and Latex ? ? ?Review of Systems ?Review of Systems  ?Constitutional:  Positive for fever. Negative for chills.  ?HENT:  Positive for congestion, rhinorrhea and sore throat.   ?Respiratory:  Positive for cough and shortness  of breath.   ?Gastrointestinal: Negative.   ?Genitourinary: Negative.   ? ? ?Physical Exam ?Triage Vital Signs ?ED Triage Vitals  ?Enc Vitals Group  ?   BP 11/04/21 1428 125/76  ?   Pulse Rate 11/04/21 1428 87  ?   Resp 11/04/21 1428 20  ?   Temp 11/04/21 1428 98.8 ?F (37.1 ?C)  ?   Temp Source 11/04/21 1428 Oral  ?   SpO2 11/04/21 1428 98 %  ?   Weight --   ?   Height --   ?   Head Circumference --   ?   Peak Flow --   ?   Pain Score 11/04/21 1427 4  ?   Pain Loc --   ?   Pain Edu? --   ?   Excl. in GC? --   ? ?No data found. ? ?Updated Vital Signs ?BP 125/76 (BP Location: Right Arm)   Pulse 87   Temp 98.8 ?F (37.1 ?C) (Oral)   Resp 20   LMP 10/10/2021   SpO2 98%   Breastfeeding No  ? ?Visual Acuity ?Right Eye Distance:   ?Left Eye Distance:   ?Bilateral Distance:   ? ?Right Eye Near:   ?Left Eye Near:    ?Bilateral Near:    ? ?Physical Exam ?Constitutional:   ?   Appearance: She is well-developed.  ?HENT:  ?   Head: Normocephalic and atraumatic.  ?   Mouth/Throat:  ?   Mouth: Mucous membranes are moist.  ?   Pharynx: No pharyngeal swelling, oropharyngeal exudate or posterior oropharyngeal erythema.  ?Eyes:  ?   Pupils: Pupils are equal, round, and reactive to light.  ?Cardiovascular:  ?   Rate and Rhythm: Normal rate and regular rhythm.  ?Pulmonary:  ?   Effort: Pulmonary effort is normal.  ?   Breath sounds: Normal breath sounds. No wheezing.  ?Musculoskeletal:  ?   Cervical back: Normal range of motion and neck supple. Tenderness present.  ?Lymphadenopathy:  ?   Cervical: No cervical  adenopathy.  ?Neurological:  ?   Mental Status: She is alert.  ? ? ? ?UC Treatments / Results  ?Labs ?(all labs ordered are listed, but only abnormal results are displayed) ?Labs Reviewed  ?CULTURE, GROUP A STREP (THR

## 2021-11-04 NOTE — ED Triage Notes (Signed)
Pt Presents with ongoing shortness of breath and throat swelling X 3 weeks that has been unrelieved with prescribed medication; pt is asthmatic. ? ?Pt also complains of intermittent nausea.  ?

## 2021-11-04 NOTE — Discharge Instructions (Addendum)
You were seen today for intermittent sore throat, fever, and upper respiratory symptoms.  ?Your rapid strep was negative.  This will be sent for culture.  ?The vaginal and oral swab for STDs will be resulted tomorrow.  If either are positive we will call to notify you for treatment.  ?Your symptoms may be due to allergies, or viral.  I recommend over the counter zyrtec/claritin or sudafed, as well as flonase nasal spray.  You do not have any obvious swelling on exam today or other concerning signs.  If your throat does swell and you have difficulty breathing then please go to the ER for evaluation.  ?

## 2021-11-05 ENCOUNTER — Encounter (HOSPITAL_COMMUNITY): Payer: Self-pay

## 2021-11-05 ENCOUNTER — Emergency Department (HOSPITAL_COMMUNITY)
Admission: EM | Admit: 2021-11-05 | Discharge: 2021-11-05 | Disposition: A | Discharge status: Home / Self Care | Payer: Medicaid Other | Attending: Emergency Medicine | Admitting: Emergency Medicine

## 2021-11-05 ENCOUNTER — Other Ambulatory Visit: Payer: Self-pay

## 2021-11-05 DIAGNOSIS — J45909 Unspecified asthma, uncomplicated: Secondary | ICD-10-CM | POA: Diagnosis not present

## 2021-11-05 DIAGNOSIS — J029 Acute pharyngitis, unspecified: Secondary | ICD-10-CM | POA: Insufficient documentation

## 2021-11-05 DIAGNOSIS — Z91040 Latex allergy status: Secondary | ICD-10-CM | POA: Diagnosis not present

## 2021-11-05 LAB — GROUP A STREP BY PCR: Group A Strep by PCR: NOT DETECTED

## 2021-11-05 LAB — MONONUCLEOSIS SCREEN: Mono Screen: NEGATIVE

## 2021-11-05 MED ORDER — LIDOCAINE VISCOUS HCL 2 % MT SOLN: 15.0000 mL | OROMUCOSAL | 0 refills | Status: DC | PRN

## 2021-11-05 MED ORDER — LIDOCAINE VISCOUS HCL 2 % MT SOLN
15.0000 mL | Freq: Once | OROMUCOSAL | Status: AC
  Administered 2021-11-05: 15 mL via OROMUCOSAL
  Filled 2021-11-05: qty 15

## 2021-11-05 NOTE — Discharge Instructions (Addendum)
You came to the emergency department today to be evaluated for your sore throat.  Your physical exam was reassuring.  Your strep test and test for mono were both negative.   ? ?Please follow-up with your primary care provider for repeat evaluation if your symptoms do not improve. ? ?Please take Ibuprofen (Advil, motrin) and Tylenol (acetaminophen) to relieve your pain.   ? ?You may take up to 600 MG (3 pills) of normal strength ibuprofen every 8 hours as needed.   ?You make take tylenol, up to 1,000 mg (two extra strength pills) every 8 hours as needed.  ? ?It is safe to take ibuprofen and tylenol at the same time as they work differently.  ? Do not take more than 3,000 mg tylenol in a 24 hour period (not more than one dose every 8 hours.  Please check all medication labels as many medications such as pain and cold medications may contain tylenol.  Do not drink alcohol while taking these medications.  Do not take other NSAID'S while taking ibuprofen (such as aleve or naproxen).  Please take ibuprofen with food to decrease stomach upset. ? ? ?Get help right away if: ?You have difficulty breathing. ?You cannot swallow fluids, soft foods, or your saliva. ?You have increased swelling in your throat or neck. ?You have persistent nausea and vomiti ?

## 2021-11-05 NOTE — ED Triage Notes (Signed)
Pt c/o ongoing sore throat and feeling like her lymph nodes are swollen for two weeks.  ?

## 2021-11-05 NOTE — ED Provider Notes (Signed)
?[facility] EMERGENCY DEPARTMENT ?Provider Note ? ? ?CSN: [id number] ?Arrival date & time: 11/05/21  1044 ? ?  ? ?History ? ?Chief Complaint  ?Patient presents with  ? Sore Throat  ? ? ?Melissa [name] is a 21 y.o. female with a past medical history of asthma, anxiety, congenital hydronephrosis, R herpes, migraines, seizures.  Presents emergency department with a chief complaint of sore throat.  Patient reports that she has had sore throat and swelling to her lymph nodes for the past 2 weeks.  Patient states that pain is worse with swallowing and talking.  Patient has tried Profen with minimal improvement in her symptoms.  Patient states that she has episodes where intermittently her throat feels "numb."  Patient endorses trouble breathing. ? ?Patient denies any fever, chills, drooling, trismus, hot potato voice, trouble swallowing, neck pain, neck stiffness, abdominal pain, nausea, vomiting. ? ? ?Sore Throat ?Associated symptoms include shortness of breath. Pertinent negatives include no chest pain, no abdominal pain and no headaches.  ? ?  ? ?Home Medications ?Prior to Admission medications   ?Medication Sig Start Date End Date Taking? Authorizing Provider  ?acetaminophen (TYLENOL) 325 MG tablet Take 650 mg by mouth every 6 (six) hours as needed.    [provider]  ?acetaminophen (TYLENOL) 500 MG tablet Take 2 tablets (1,000 mg total) by mouth every 6 (six) hours. 09/03/21   Brand Males, CNM  ?albuterol (VENTOLIN HFA) 108 (90 Base) MCG/ACT inhaler Inhale 1-2 puffs into the lungs every 4 (four) hours as needed for wheezing or shortness of breath. 10/07/21   Zenia Resides, MD  ?docusate sodium (COLACE) 100 MG capsule Take 1 capsule (100 mg total) by mouth at bedtime. Start after completion of miralax. ?Patient taking differently: Take 100 mg by mouth daily as needed for mild constipation or moderate constipation. Start after completion of miralax. 05/07/21   Gerrit Heck, CNM  ?famotidine (PEPCID) 20 MG tablet Take 1 tablet (20 mg total) by mouth 2 (two) times daily. 10/07/21   Zenia Resides, MD  ?ferrous sulfate 325 (65 FE) MG tablet Take 325 mg by mouth daily with breakfast.    [provider]  ?hydrOXYzine (ATARAX) 25 MG tablet Take 1 tablet (25 mg total) by mouth every 6 (six) hours as needed for anxiety. 11/02/21   Dartha Lodge, PA-C  ?ibuprofen (ADVIL) 800 MG tablet Take 1 tablet (800 mg total) by mouth 3 (three) times daily. 09/06/21   Wallis Bamberg, PA-C  ?loratadine (CLARITIN) 10 MG tablet Take 1 tablet (10 mg total) by mouth daily. 09/25/21   Gerrit Heck, CNM  ?polyethylene glycol (MIRALAX / GLYCOLAX) 17 g packet Take 17 g by mouth daily as needed for moderate constipation or mild constipation.    [provider]  ?Prenatal Vit-Fe Fumarate-FA (PREPLUS) 27-1 MG TABS Take 1 tablet by mouth daily. 02/09/21   Brock Bad, MD  ?valACYclovir (VALTREX) 1000 MG tablet Take 1,000 mg by mouth 2 (two) times daily.    [provider]  ?rizatriptan (MAXALT-MLT) 10 MG disintegrating tablet Take 1 tablet at onset of migraine with 2 ibuprofen may repeat an additional tablet in 2 hours if needed 05/07/20 06/09/20  Deetta Perla, MD  ?   ? ?Allergies    ?Benadryl [diphenhydramine hcl], Gluten meal, Shellfish allergy, Zithromax [azithromycin], Lactose intolerance (gi), and Latex   ? ?Review of Systems   ?Review of Systems  ?Constitutional:  Negative for chills and fever.  ?HENT:  Positive for sore throat. Negative for drooling, facial swelling, trouble swallowing and voice change.   ?Eyes:  Negative for visual disturbance.  ?Respiratory:  Positive for shortness of breath.   ?Cardiovascular:  Negative for chest pain.  ?Gastrointestinal:  Negative for abdominal pain, nausea and vomiting.  ?Musculoskeletal:  Negative for back pain and neck pain.  ?Skin:  Negative for color change and rash.  ?Neurological:  Negative for dizziness, syncope,  light-headedness and headaches.  ?Psychiatric/Behavioral:  Negative for confusion.   ? ?Physical Exam ?Updated Vital Signs ?BP 110/84 (BP Location: Left Arm)   Pulse 81   Temp 98.3 ?F (36.8 ?C) (Oral)   Resp 16   LMP 10/10/2021   SpO2 97%  ?Physical Exam ?Vitals and nursing note reviewed.  ?Constitutional:   ?   General: She is not in acute distress. ?   Appearance: She is not ill-appearing, toxic-appearing or diaphoretic.  ?HENT:  ?   Head: Normocephalic.  ?   Jaw: There is normal jaw occlusion. No trismus, tenderness, swelling, pain on movement or malocclusion.  ?   Mouth/Throat:  ?   Lips: Pink. No lesions.  ?   Mouth: Mucous membranes are moist.  ?   Dentition: No dental abscesses.  ?   Tongue: No lesions. Tongue does not deviate from midline.  ?   Palate: No mass and lesions.  ?   Pharynx: Oropharynx is clear. Uvula midline. No pharyngeal swelling, oropharyngeal exudate, posterior oropharyngeal erythema or uvula swelling.  ?   Tonsils: No tonsillar exudate or tonsillar abscesses. 1+ on the right. 1+ on the left.  ?   Comments: Handles oral secretions without difficulty. ?Eyes:  ?   General: No scleral icterus.    ?   Right eye: No discharge.     ?   Left eye: No discharge.  ?Neck:  ?   Comments: No swelling to submandibular space ?Cardiovascular:  ?   Rate and Rhythm: Normal rate.  ?Pulmonary:  ?   Effort: Pulmonary effort is normal.  ?Musculoskeletal:  ?   Cervical back: Full passive range of motion without pain, normal range of motion and neck supple. No edema, erythema, signs of trauma, rigidity, torticollis or crepitus. No pain with movement, spinous process tenderness or muscular tenderness. Normal range of motion.  ?Lymphadenopathy:  ?   Cervical: No cervical adenopathy.  ?Skin: ?   General: Skin is warm and dry.  ?Neurological:  ?   General: No focal deficit present.  ?   Mental Status: She is alert.  ?Psychiatric:     ?   Behavior: Behavior is cooperative.  ? ? ?ED Results / Procedures / Treatments    ?Labs ?(all labs ordered are listed, but only abnormal results are displayed) ?Labs Reviewed  ?GROUP A STREP BY PCR  ?MONONUCLEOSIS SCREEN  ? ? ?EKG ?None ? ?Radiology ?No results found. ? ?Procedures ?Procedures  ? ? ?Medications Ordered in ED ?Medications  ?lidocaine (XYLOCAINE) 2 % viscous mouth solution 15 mL (has no administration in time range)  ? ? ?ED Course/ Medical Decision Making/ A&P ?  ?                        ?Medical Decision Making ?Amount and/or Complexity of Data Reviewed ?Labs: ordered. ? ?Risk ?Prescription drug management. ? ? ?Alert 21 year old female in no acute distress, nontoxic-appearing.  Presents emergency department with a chief complaint of sore throat. ? ?Information is obtained from patient.  Past medical  records were reviewed including previous provider notes, labs, and imaging.  Patient has medical history as outlined in HPI which complicates her care. ? ?Per chart review patient was seen at urgent care on 3/17.  Rapid strep PCR was negative.  Strep culture pending. ? ?Strep PCR was ordered while patient was in triage today.  We will add testing for mononucleosis. ? ?On physical exam patient has no swelling, erythema, or exudate to tonsils bilaterally as well as oropharynx.  Handles oral secretions without difficulty.  No trismus, swelling to submandibular space, facial swelling, pain with passive range of motion of neck.  Low suspicion for Ludewig's angina or other deep space neck infection at this time. ? ?Patient was given viscous lidocaine for sore throat. ? ?I personally viewed and interpreted patient's lab results.  Patient was negative for strep and mononucleosis. ? ?Patient reports improvement sore throat after receiving viscous lidocaine.  Patient hemodynamically stable at this time.  Will discharge patient with prescription for viscous lidocaine.  Discussed symptomatic treatment with salt water gargles as well as over-the-counter pain medication.  Patient to follow-up  with primary care provider if she has continued complaints of sore throat. ? ?Discussed results, findings, treatment and follow up. Patient advised of return precautions. Patient verbalized understanding and agreed with pl

## 2021-11-07 ENCOUNTER — Telehealth (HOSPITAL_COMMUNITY): Payer: Self-pay | Admitting: Emergency Medicine

## 2021-11-07 LAB — CERVICOVAGINAL ANCILLARY ONLY
Bacterial Vaginitis (gardnerella): POSITIVE — AB
Candida Glabrata: NEGATIVE
Candida Vaginitis: NEGATIVE
Chlamydia: NEGATIVE
Comment: NEGATIVE
Comment: NEGATIVE
Comment: NEGATIVE
Comment: NEGATIVE
Comment: NEGATIVE
Comment: NORMAL
Neisseria Gonorrhea: NEGATIVE
Trichomonas: NEGATIVE

## 2021-11-07 LAB — CYTOLOGY, (ORAL, ANAL, URETHRAL) ANCILLARY ONLY
Chlamydia: NEGATIVE
Comment: NEGATIVE
Comment: NEGATIVE
Comment: NORMAL
Neisseria Gonorrhea: NEGATIVE
Trichomonas: NEGATIVE

## 2021-11-07 LAB — CULTURE, GROUP A STREP (THRC)

## 2021-11-07 MED ORDER — METRONIDAZOLE 500 MG PO TABS: 500.0000 mg | ORAL_TABLET | Freq: Two times a day (BID) | ORAL | 0 refills | Status: DC

## 2021-11-16 ENCOUNTER — Ambulatory Visit (HOSPITAL_COMMUNITY)
Admission: EM | Admit: 2021-11-16 | Discharge: 2021-11-17 | Disposition: A | Discharge status: Other Acute Inpatient Hospital | Payer: Medicaid Other | Source: Home / Self Care

## 2021-11-16 ENCOUNTER — Emergency Department (HOSPITAL_COMMUNITY)
Admission: EM | Admit: 2021-11-16 | Discharge: 2021-11-16 | Disposition: A | Discharge status: Home / Self Care | Payer: Medicaid Other | Attending: Emergency Medicine | Admitting: Emergency Medicine

## 2021-11-16 DIAGNOSIS — R45851 Suicidal ideations: Secondary | ICD-10-CM | POA: Diagnosis not present

## 2021-11-16 DIAGNOSIS — F53 Postpartum depression: Secondary | ICD-10-CM | POA: Insufficient documentation

## 2021-11-16 DIAGNOSIS — Z91040 Latex allergy status: Secondary | ICD-10-CM | POA: Diagnosis not present

## 2021-11-16 DIAGNOSIS — F70 Mild intellectual disabilities: Secondary | ICD-10-CM | POA: Diagnosis present

## 2021-11-16 DIAGNOSIS — F410 Panic disorder [episodic paroxysmal anxiety] without agoraphobia: Secondary | ICD-10-CM | POA: Diagnosis not present

## 2021-11-16 DIAGNOSIS — J45909 Unspecified asthma, uncomplicated: Secondary | ICD-10-CM | POA: Diagnosis not present

## 2021-11-16 DIAGNOSIS — F411 Generalized anxiety disorder: Secondary | ICD-10-CM | POA: Insufficient documentation

## 2021-11-16 DIAGNOSIS — O99345 Other mental disorders complicating the puerperium: Secondary | ICD-10-CM | POA: Insufficient documentation

## 2021-11-16 DIAGNOSIS — Z20822 Contact with and (suspected) exposure to covid-19: Secondary | ICD-10-CM | POA: Insufficient documentation

## 2021-11-16 DIAGNOSIS — F419 Anxiety disorder, unspecified: Secondary | ICD-10-CM

## 2021-11-16 DIAGNOSIS — F32A Depression, unspecified: Secondary | ICD-10-CM | POA: Insufficient documentation

## 2021-11-16 DIAGNOSIS — F600 Paranoid personality disorder: Secondary | ICD-10-CM | POA: Insufficient documentation

## 2021-11-16 DIAGNOSIS — F17210 Nicotine dependence, cigarettes, uncomplicated: Secondary | ICD-10-CM | POA: Diagnosis not present

## 2021-11-16 DIAGNOSIS — Z8659 Personal history of other mental and behavioral disorders: Secondary | ICD-10-CM

## 2021-11-16 DIAGNOSIS — Z8759 Personal history of other complications of pregnancy, childbirth and the puerperium: Secondary | ICD-10-CM

## 2021-11-16 DIAGNOSIS — R4189 Other symptoms and signs involving cognitive functions and awareness: Secondary | ICD-10-CM

## 2021-11-16 LAB — CBC WITH DIFFERENTIAL/PLATELET
Abs Immature Granulocytes: 0.01 10*3/uL (ref 0.00–0.07)
Basophils Absolute: 0 10*3/uL (ref 0.0–0.1)
Basophils Relative: 0 %
Eosinophils Absolute: 0.2 10*3/uL (ref 0.0–0.5)
Eosinophils Relative: 3 %
HCT: 36.4 % (ref 36.0–46.0)
Hemoglobin: 12 g/dL (ref 12.0–15.0)
Immature Granulocytes: 0 %
Lymphocytes Relative: 39 %
Lymphs Abs: 2.4 10*3/uL (ref 0.7–4.0)
MCH: 26.6 pg (ref 26.0–34.0)
MCHC: 33 g/dL (ref 30.0–36.0)
MCV: 80.7 fL (ref 80.0–100.0)
Monocytes Absolute: 0.5 10*3/uL (ref 0.1–1.0)
Monocytes Relative: 9 %
Neutro Abs: 3.1 10*3/uL (ref 1.7–7.7)
Neutrophils Relative %: 49 %
Platelets: 235 10*3/uL (ref 150–400)
RBC: 4.51 MIL/uL (ref 3.87–5.11)
RDW: 17.2 % — ABNORMAL HIGH (ref 11.5–15.5)
WBC: 6.2 10*3/uL (ref 4.0–10.5)
nRBC: 0 % (ref 0.0–0.2)

## 2021-11-16 LAB — T4, FREE: Free T4: 0.83 ng/dL (ref 0.61–1.12)

## 2021-11-16 LAB — I-STAT BETA HCG BLOOD, ED (MC, WL, AP ONLY): I-stat hCG, quantitative: <5 m[IU]/mL (ref <5)

## 2021-11-16 LAB — RAPID URINE DRUG SCREEN, HOSP PERFORMED
Amphetamines: NOT DETECTED
Barbiturates: NOT DETECTED
Benzodiazepines: NOT DETECTED
Cocaine: NOT DETECTED
Opiates: NOT DETECTED
Tetrahydrocannabinol: NOT DETECTED

## 2021-11-16 LAB — TSH: TSH: 0.422 u[IU]/mL (ref 0.350–4.500)

## 2021-11-16 LAB — COMPREHENSIVE METABOLIC PANEL
ALT: 14 U/L (ref 0–44)
AST: 17 U/L (ref 15–41)
Albumin: 3.6 g/dL (ref 3.5–5.0)
Alkaline Phosphatase: 71 U/L (ref 38–126)
Anion gap: 6 (ref 5–15)
BUN: <5 mg/dL — ABNORMAL LOW (ref 6–20)
CO2: 24 mmol/L (ref 22–32)
Calcium: 9.3 mg/dL (ref 8.9–10.3)
Chloride: 108 mmol/L (ref 98–111)
Creatinine, Ser: 0.83 mg/dL (ref 0.44–1.00)
GFR, Estimated: >60 mL/min (ref >60)
Glucose, Bld: 95 mg/dL (ref 70–99)
Potassium: 3.6 mmol/L (ref 3.5–5.1)
Sodium: 138 mmol/L (ref 135–145)
Total Bilirubin: 0.4 mg/dL (ref 0.3–1.2)
Total Protein: 6.6 g/dL (ref 6.5–8.1)

## 2021-11-16 LAB — SALICYLATE LEVEL: Salicylate Lvl: <7 mg/dL — ABNORMAL LOW (ref 7.0–30.0)

## 2021-11-16 LAB — RESP PANEL BY RT-PCR (FLU A&B, COVID) ARPGX2
Influenza A by PCR: NEGATIVE
Influenza B by PCR: NEGATIVE
SARS Coronavirus 2 by RT PCR: NEGATIVE

## 2021-11-16 LAB — ACETAMINOPHEN LEVEL: Acetaminophen (Tylenol), Serum: <10 ug/mL — ABNORMAL LOW (ref 10–30)

## 2021-11-16 LAB — ETHANOL: Alcohol, Ethyl (B): <10 mg/dL (ref <10)

## 2021-11-16 MED ORDER — VALACYCLOVIR HCL 500 MG PO TABS
1000.0000 mg | ORAL_TABLET | Freq: Two times a day (BID) | ORAL | Status: DC
Start: 2021-11-16 — End: 2021-11-16
  Administered 2021-11-16: 1000 mg via ORAL
  Filled 2021-11-16: qty 2

## 2021-11-16 MED ORDER — SERTRALINE HCL 50 MG PO TABS
50.0000 mg | ORAL_TABLET | Freq: Every day | ORAL | Status: DC
  Administered 2021-11-16 – 2021-11-17 (×2): 50 mg via ORAL
  Filled 2021-11-16 (×2): qty 1

## 2021-11-16 MED ORDER — ALUM & MAG HYDROXIDE-SIMETH 200-200-20 MG/5ML PO SUSP: 30.0000 mL | ORAL | Status: DC | PRN

## 2021-11-16 MED ORDER — MAGNESIUM HYDROXIDE 400 MG/5ML PO SUSP: 30.0000 mL | Freq: Every day | ORAL | Status: DC | PRN

## 2021-11-16 MED ORDER — ACETAMINOPHEN 325 MG PO TABS: 650.0000 mg | ORAL_TABLET | Freq: Four times a day (QID) | ORAL | Status: DC | PRN

## 2021-11-16 MED ORDER — OLANZAPINE 5 MG PO TABS: 2.5000 mg | ORAL_TABLET | Freq: Every day | ORAL | Status: DC

## 2021-11-16 MED ORDER — LORAZEPAM 1 MG PO TABS
1.0000 mg | ORAL_TABLET | Freq: Three times a day (TID) | ORAL | Status: DC | PRN
  Administered 2021-11-16: 1 mg via ORAL
  Filled 2021-11-16: qty 1

## 2021-11-16 MED ORDER — TRAZODONE HCL 50 MG PO TABS: 50.0000 mg | ORAL_TABLET | Freq: Every evening | ORAL | Status: DC | PRN

## 2021-11-16 NOTE — ED Notes (Signed)
Pt asleep in bed. Respirations even and unlabored. Will continue to monitor for safety. ?

## 2021-11-16 NOTE — ED Notes (Signed)
Patient having TTS done at this time. 

## 2021-11-16 NOTE — BH Assessment (Signed)
Clinician made contact with pt's team in an effort to complete pt's MH Assessment. A room for pt to have her assessment in could not be identified; pt's nurse to make contact with clinician if a room is identified. ?

## 2021-11-16 NOTE — ED Notes (Signed)
Pt is saying she wants to go home. Pt reports she has 2 children and wants to go to work. This Clinical research associate called [facility] and obtained information to confirm BH will asses her this AM. This Clinical research associate reported to Pt in the information. ?
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
## 2021-11-16 NOTE — BH Assessment (Signed)
Comprehensive Clinical Assessment (CCA) Note ? ?11/16/2021 ?Melissa [name] ?[id number] ? ?Melissa [name], reviewed pt's chart and information and determined pt should receive continuous assessment and be re-assessed by psychiatry in the morning. Pt is to remain at [facility] at this time. This information was relayed to pt's team at 0450. ? ?The patient demonstrates the following risk factors for suicide: Chronic risk factors for suicide include: psychiatric disorder of PPD . Acute risk factors for suicide include: family or marital conflict and social withdrawal/isolation. Protective factors for this patient include: positive social support, responsibility to others (children, family), and hope for the future. Considering these factors, the overall suicide risk at this point appears to be low. Patient is not appropriate for outpatient follow up. ? ?Therefore, a tele-sitter is recommended for suicide precautions. ? ?[facility] from 11/16/2021 in [facility] from 11/05/2021 in [facility] from 11/04/2021 in [facility] at [facility]  ?C-SSRS RISK CATEGORY Low Risk No Risk No Risk  ? ?  ?Chief Complaint:  ?Chief Complaint  ?Patient presents with  ? Panic Attack  ? Suicidal  ? Depression  ? ?Visit Diagnosis: PPD, GAD ?CCA Screening, Triage and Referral (STR) ?Melissa [name] is a 21 year old patient who was brought to the [facility] via EMS due to ongoing anxiety. Pt states, "I got into an argument with my mom. 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 Pt denies she is currently receiving treatment for her mental health conerns.  ? ?Pt acknowledges SI and a hx of SI. She denies she's ever attempted to kill herself or that she has a plan to kill herself. Pt denies she's ever been hospitalized for mental health concerns.  Pt denies HI, AVH, NSSIB, access to guns/weapons, or engagement with the legal system. Pt endorses the use of EtOH, though she's unable to report what and how much she drank.  ? ?Of note, pt was exceptionally tired during the assessment process and continuously fell asleep, requring clinician to ask each question numerous times as well as limited the amount of information clinician was able to obtain. ? ?Pt's orientation and memory was UTA. Pt was sleepy throughout the assessment and difficult to arouse and get much information from. Pt's insight, judgement, and impulse control is impaired at this time. ? ?Patient Reported Information ?How did you hear about [place]? No data recorded ?What Is the Reason for Your Visit/Call Today? Pt states, "I got into an argument with my mom. 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 Pt denies she is currently receiving treatment for her mental health conerns. Pt acknowledges SI and a hx of SI. She denies she's ever attempted to kill herself or that she has a plan to kill herself. Pt denies she's ever been hospitalized for mental health concerns. Pt denies HI, AVH, NSSIB, access to guns/weapons, or engagement with the legal system. Pt endorses the use of EtOH, though she's unable to report what and how much she drank. Of note, pt was exceptionally tired during the assessment process and continuously fell asleep, requring clinician to ask each question numerous times as well as limited the amount of information clinician was able to obtain. ? ?How Long Has This Been Causing You  Problems? 1-6 months ? ?What Do You Feel Would Help You the Most Today? Treatment for Depression or other mood problem; Medication(s); Stress Management ? ? ?Have You Recently Had Any Thoughts About Hurting Yourself? Yes ? ?Are You Planning to Commit Suicide/Harm Yourself At This time? No ? ? ?Have you Recently  Had Thoughts About [place]? No ? ?Are You Planning to Harm Someone at This Time? No ? ?Explanation: No data recorded ? ?Have You Used Any Alcohol or Drugs in the Past 24 Hours? Yes ? ?How Long Ago Did You Use Drugs or Alcohol? No data recorded ?What Did You Use and How Much? Pt is unsure ? ? ?Do You Currently Have a Therapist/Psychiatrist? No ? ?Name of Therapist/Psychiatrist: No data recorded ? ?Have You Been Recently Discharged From Any Office Practice or Programs? No ? ?Explanation of Discharge From Practice/Program: No data recorded ? ?  ?CCA Screening Triage Referral Assessment ?Type of Contact: Tele-Assessment ? ?Telemedicine Service Delivery: Telemedicine service delivery: This service was provided via telemedicine using a 2-way, interactive audio and video technology ? ?Is this Initial or Reassessment? Initial Assessment ? ?Date Telepsych consult ordered in [facility]:  11/16/21 ? ?Time Telepsych consult ordered in [facility]:  0158 ? ?Location of Assessment: [facility] ED ? ?Provider Location: [facility] Assessment Services ? ? ?Collateral Involvement: None currently ? ? ?Does Patient Have a Stage manager Guardian? No data recorded ?Name and Contact of Legal Guardian: No data recorded ?If Minor and Not Living with Parent(s), Who has Custody? N/A ? ?Is CPS involved or ever been involved? -- (UTA) ? ?Is APS involved or ever been involved? -- (UTA) ? ? ?Patient Determined To Be At Risk for Harm To Self or Others Based on Review of Patient Reported Information or Presenting Complaint? Yes, for Self-Harm ? ?Method: No data recorded ?Availability of Means: No data recorded ?Intent: No data recorded ?Notification Required: No data recorded ?Additional Information for Danger to Others Potential: No data recorded ?Additional Comments for Danger to Others Potential: No data recorded ?Are There Guns or Other Weapons in Forest? No data recorded ?Types of Guns/Weapons: No data recorded ?Are These Weapons Safely Secured?                             No data recorded ?Who Could Verify You Are Able To Have These Secured: No data recorded ?Do You Have any Outstanding Charges, Pending Court Dates, Parole/Probation? No data recorded ?Contacted To Inform of Risk of Harm To Self or Others: Family/Significant Other: (Pt shares her family is aware) ? ? ? ?Does Patient Present under Involuntary Commitment? No ? ?IVC Papers Initial File Date: No data recorded ? ?[place] of Residence: [place] Argue ? ? ?Patient Currently Receiving the Following Services: Not Receiving Services ? ? ?Determination of Need: Urgent (48 hours) ? ? ?Options For Referral: Medication Management; Outpatient Therapy; Other: Comment (Continuous Assessment at [facility]) ? ? ? ? ?CCA Biopsychosocial ?Patient Reported Schizophrenia/Schizoaffective Diagnosis in Past: No ? ? ?Strengths: Pt is aware of her need to get assistance for her anxiety and for her PPD. Pt is receptive to receiving therapeutic services. ? ? ?Mental Health Symptoms ?Depression:   ?Change in energy/activity; Difficulty Concentrating; Fatigue; Hopelessness; Worthlessness; Increase/decrease in appetite; Irritability; Sleep (too much or little); Tearfulness ?  ?Duration of Depressive symptoms:  ?Duration of Depressive Symptoms: Greater than two weeks ?  ?Mania:   ?None ?  ?Anxiety:    ?Worrying;  Tension; Sleep; Restlessness; Irritability; Fatigue; Difficulty concentrating ?  ?Psychosis:   ?None ?  ?Duration of Psychotic symptoms:    ?Trauma:   ?None ?  ?Obsessions:   ?None ?  ?Compulsions:   ?None ?  ?Inattention:   ?None ?  ?Hyperactivity/Impulsivity:   ?None ?  ?Oppositional/Defiant Behaviors:   ?Angry; Argumentative; None ?  ?Emotional Irregularity:   ?Mood lability; Potentially harmful impulsivity; Recurrent suicidal behaviors/gestures/threats ?  ?Other Mood/Personality Symptoms:   ?None noted ?  ? ?Mental Status Exam ?Appearance and self-care  ?Stature:   ?-- (UTA - pt is lying down in bed) ?  ?Weight:   ?-- (UTA -  pt is lying down in bed) ?  ?Clothing:   ?-- [facility] scrubs) ?  ?Grooming:   ?-- (UTA - pt is lying down in bed) ?  ?Cosmetic use:   ?None ?  ?Posture/gait:   ?-- (UTA - pt is lying down in bed) ?  ?Mo

## 2021-11-16 NOTE — ED Notes (Signed)
TC to [facility] with report on Pt. ?

## 2021-11-16 NOTE — ED Notes (Signed)
Secure Chat to Sharion Dove at [facility] to get up date on when Pt can go to [facility]. ?
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
## 2021-11-16 NOTE — ED Notes (Signed)
TC to [facility] registration for transfer to Nurses station. This Probation officer left mobile # and name with staff for call back . ?
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
## 2021-11-16 NOTE — ED Notes (Signed)
Pt sitting up in chair watching TV. A&O x4, calm and cooperative. Denies current SI/HI/AVH. Apple juice given per pt request. No signs of acute distress noted. Will continue to monitor for safety. ?

## 2021-11-16 NOTE — ED Notes (Signed)
Pt extreemly tearful when she was brought back on the unit. Skin assessment complete.Pt was oriented to unit and sat on bed. Doran Heater NP in to eval.  Pt given something to drink and meal  will monitor for safety.  ?

## 2021-11-17 ENCOUNTER — Other Ambulatory Visit: Payer: Self-pay

## 2021-11-17 ENCOUNTER — Encounter (HOSPITAL_COMMUNITY): Payer: Self-pay | Admitting: Behavioral Health

## 2021-11-17 ENCOUNTER — Inpatient Hospital Stay (HOSPITAL_COMMUNITY)
Admission: AD | Admit: 2021-11-17 | Discharge: 2021-11-20 | DRG: 776 | Disposition: A | Discharge status: Home / Self Care | Payer: Medicaid Other | Source: Intra-hospital | Attending: Psychiatry | Admitting: Psychiatry

## 2021-11-17 DIAGNOSIS — J45909 Unspecified asthma, uncomplicated: Secondary | ICD-10-CM | POA: Diagnosis present

## 2021-11-17 DIAGNOSIS — K5909 Other constipation: Secondary | ICD-10-CM

## 2021-11-17 DIAGNOSIS — K5900 Constipation, unspecified: Secondary | ICD-10-CM | POA: Diagnosis present

## 2021-11-17 DIAGNOSIS — F17210 Nicotine dependence, cigarettes, uncomplicated: Secondary | ICD-10-CM | POA: Diagnosis present

## 2021-11-17 DIAGNOSIS — Z79899 Other long term (current) drug therapy: Secondary | ICD-10-CM

## 2021-11-17 DIAGNOSIS — O9953 Diseases of the respiratory system complicating the puerperium: Secondary | ICD-10-CM | POA: Diagnosis present

## 2021-11-17 DIAGNOSIS — F53 Postpartum depression: Secondary | ICD-10-CM

## 2021-11-17 DIAGNOSIS — F332 Major depressive disorder, recurrent severe without psychotic features: Secondary | ICD-10-CM

## 2021-11-17 DIAGNOSIS — O99335 Smoking (tobacco) complicating the puerperium: Secondary | ICD-10-CM | POA: Diagnosis present

## 2021-11-17 DIAGNOSIS — F411 Generalized anxiety disorder: Secondary | ICD-10-CM

## 2021-11-17 DIAGNOSIS — Z818 Family history of other mental and behavioral disorders: Secondary | ICD-10-CM

## 2021-11-17 DIAGNOSIS — Z888 Allergy status to other drugs, medicaments and biological substances status: Secondary | ICD-10-CM

## 2021-11-17 DIAGNOSIS — R569 Unspecified convulsions: Secondary | ICD-10-CM

## 2021-11-17 DIAGNOSIS — G43109 Migraine with aura, not intractable, without status migrainosus: Secondary | ICD-10-CM

## 2021-11-17 DIAGNOSIS — S0990XS Unspecified injury of head, sequela: Secondary | ICD-10-CM

## 2021-11-17 DIAGNOSIS — G40909 Epilepsy, unspecified, not intractable, without status epilepticus: Secondary | ICD-10-CM | POA: Diagnosis present

## 2021-11-17 DIAGNOSIS — A6000 Herpesviral infection of urogenital system, unspecified: Secondary | ICD-10-CM

## 2021-11-17 DIAGNOSIS — B009 Herpesviral infection, unspecified: Secondary | ICD-10-CM

## 2021-11-17 DIAGNOSIS — O99345 Other mental disorders complicating the puerperium: Principal | ICD-10-CM | POA: Diagnosis present

## 2021-11-17 DIAGNOSIS — R45851 Suicidal ideations: Secondary | ICD-10-CM | POA: Diagnosis present

## 2021-11-17 DIAGNOSIS — Z3A37 37 weeks gestation of pregnancy: Secondary | ICD-10-CM

## 2021-11-17 DIAGNOSIS — K30 Functional dyspepsia: Secondary | ICD-10-CM | POA: Diagnosis present

## 2021-11-17 DIAGNOSIS — F3481 Disruptive mood dysregulation disorder: Secondary | ICD-10-CM

## 2021-11-17 DIAGNOSIS — O99893 Other specified diseases and conditions complicating puerperium: Secondary | ICD-10-CM | POA: Diagnosis present

## 2021-11-17 DIAGNOSIS — O26899 Other specified pregnancy related conditions, unspecified trimester: Secondary | ICD-10-CM

## 2021-11-17 DIAGNOSIS — Z98891 History of uterine scar from previous surgery: Secondary | ICD-10-CM

## 2021-11-17 DIAGNOSIS — R259 Unspecified abnormal involuntary movements: Secondary | ICD-10-CM

## 2021-11-17 DIAGNOSIS — O99355 Diseases of the nervous system complicating the puerperium: Secondary | ICD-10-CM | POA: Diagnosis present

## 2021-11-17 DIAGNOSIS — F70 Mild intellectual disabilities: Secondary | ICD-10-CM

## 2021-11-17 DIAGNOSIS — D573 Sickle-cell trait: Secondary | ICD-10-CM

## 2021-11-17 DIAGNOSIS — G40209 Localization-related (focal) (partial) symptomatic epilepsy and epileptic syndromes with complex partial seizures, not intractable, without status epilepticus: Secondary | ICD-10-CM

## 2021-11-17 HISTORY — DX: Postpartum depression: F53.0

## 2021-11-17 LAB — T3, FREE: T3, Free: 3 pg/mL (ref 2.0–4.4)

## 2021-11-17 MED ORDER — MAGNESIUM HYDROXIDE 400 MG/5ML PO SUSP
30.0000 mL | Freq: Every day | ORAL | Status: DC | PRN
  Administered 2021-11-18: 30 mL via ORAL
  Filled 2021-11-17: qty 30

## 2021-11-17 MED ORDER — ALUM & MAG HYDROXIDE-SIMETH 200-200-20 MG/5ML PO SUSP: 30.0000 mL | ORAL | Status: DC | PRN

## 2021-11-17 MED ORDER — SERTRALINE HCL 50 MG PO TABS
50.0000 mg | ORAL_TABLET | Freq: Every day | ORAL | Status: DC
  Administered 2021-11-18: 50 mg via ORAL
  Filled 2021-11-17 (×3): qty 1

## 2021-11-17 MED ORDER — HYDROXYZINE HCL 25 MG PO TABS
25.0000 mg | ORAL_TABLET | Freq: Three times a day (TID) | ORAL | Status: DC | PRN
  Administered 2021-11-18: 25 mg via ORAL
  Filled 2021-11-17: qty 1
  Filled 2021-11-17: qty 21
  Filled 2021-11-17 (×2): qty 1

## 2021-11-17 MED ORDER — TRAZODONE HCL 50 MG PO TABS
50.0000 mg | ORAL_TABLET | Freq: Every evening | ORAL | Status: DC | PRN
  Administered 2021-11-18: 50 mg via ORAL
  Filled 2021-11-17: qty 1

## 2021-11-17 MED ORDER — ACETAMINOPHEN 325 MG PO TABS: 650.0000 mg | ORAL_TABLET | Freq: Four times a day (QID) | ORAL | Status: DC | PRN

## 2021-11-17 NOTE — ED Notes (Signed)
Pt asleep in bed. Respirations even and unlabored. Will continue to monitor for safety. ?

## 2021-11-17 NOTE — Tx Team (Signed)
Initial Treatment Plan ?11/17/2021 ?5:43 PM ?[name] ?[id number] ? ? ? ?PATIENT STRESSORS: ?Financial difficulties   ?Occupational concerns   ?Other: single mom, 2 kids to care for.   ? ? ?PATIENT STRENGTHS: ?Motivation for treatment/growth  ?Supportive family/friends  ? ? ?PATIENT IDENTIFIED PROBLEMS: ?Anxiety  ?Depression  ?Panic attack  ?Confusion  ?  ?  ?  ?  ?  ?  ? ?DISCHARGE CRITERIA:  ?Ability to meet basic life and health needs ?Adequate post-discharge living arrangements ? ?PRELIMINARY DISCHARGE PLAN: ?Return to previous living arrangement ? ?PATIENT/FAMILY INVOLVEMENT: ?This treatment plan has been presented to and reviewed with the patient, Melissa [name], and/or family member.  The patient and family have been given the opportunity to ask questions and make suggestions. ? ?Dorris Carnes, RN ?11/17/2021, 5:43 PM ?

## 2021-11-17 NOTE — Plan of Care (Signed)
  Problem: Coping: Goal: Ability to verbalize frustrations and anger appropriately will improve Outcome: Progressing Goal: Ability to demonstrate self-control will improve Outcome: Progressing   

## 2021-11-17 NOTE — Progress Notes (Signed)
Pt was accepted to [facility] 11/17/2021 arrival time 12-12:30 (noon); Bed Assignment 407-2 ? ?DX MDD/Post Partum Depression.  ? ?Pt meets inpatient criteria per  ? ?Attending Physician will be MD Harrold Donath  ? ?Report can be called to: Adult unit: [phone] ? ?Per [facility]: Please fax Vol consent to [phone]. Original consent to accompany at transport. BED is empty now if we can plan to have patient arrive between 07-1229. Please inform patient WE ARE UNDER NORO VIRUS PRECAUTIONS AT THIS TIME AND ALL PATIENTS ARE ISOLATING IN ROOM, NO PROGRAMMING UNTIL Monday 11/21/2021 per infection prevention. Unless precautions are lifted sooner but is unlikely at this time. ? ? ?Care Team notified: [facility] Howell Pringle, EN, Dossie Arbour, RN, Letta Pate, and Doran Heater, FNP. ? ? ?Kelton Pillar, LCSWA ?11/17/2021 @ 11:25 AM ? ?

## 2021-11-17 NOTE — Progress Notes (Signed)
Admission note: Melissa [name].  is a  21 year old female,  admitted from ED for anxiety, and depression, Patient walked in to [facility] adult unit at 1339. Patient is alert and oriented to person, place and situation on arrival to the unit.  Patient denies SI/HI/AVH during admission, but endorses anxiety and depression. 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 Patient states her stressor are living with her parents, having two kids ( 2 years, 2 months olds) she not able to care for. Patient states she is single mom and has no job. Patient affect is flat, she is minimal and only says few words during admission assessment.  ? Pt. Is oriented to the unit, room and routine. Information packet and safety information with unit expectation given to her.  Admission INP armband ID verified with patient. No contraband found during skin assessment, Skin, clean-dry- intact without evidence of bruising, or skin tears and  no tracks marks noted  on skin. Patient denies pain or any discomfort, no distress noted at this time.  Q 15 minutes safety observation in place. Staff will continue to support and treat per MD orders ? ?Signed by :Myrlene Broker, BSN, RN. ? ?

## 2021-11-17 NOTE — ED Notes (Signed)
[name] transferred to [facility] per NP order. Discussed with the patient and all questions fully answered.  ?An EMTALA and Med Necessity forms were printed and to be given to the receiving nurse. Patient escorted out and transferred via safe transport.Marland Kitchen  ?Antuane Eastridge  Marquis Lunch  ?11/17/2021 12:30 PM ?  ?   ?

## 2021-11-17 NOTE — Progress Notes (Addendum)
Patient signed the 72 hours request for discharge form at 1624 of 11-17-21 ?

## 2021-11-17 NOTE — ED Notes (Signed)
Report given to [place], RN [facility] 

## 2021-11-17 NOTE — Progress Notes (Signed)
Pt is presently asleep. No distress noted or concerns voiced. Administered scheduled med with no issue. Pt denies current SI/HI/AVH. Staff will monitor for pt's safety. ?

## 2021-11-18 MED ORDER — LORAZEPAM 2 MG/ML IJ SOLN: 2.0000 mg | Freq: Two times a day (BID) | INTRAMUSCULAR | Status: DC | PRN

## 2021-11-18 MED ORDER — ALBUTEROL SULFATE HFA 108 (90 BASE) MCG/ACT IN AERS
1.0000 | INHALATION_SPRAY | RESPIRATORY_TRACT | Status: DC | PRN
  Administered 2021-11-18: 2 via RESPIRATORY_TRACT
  Administered 2021-11-18: 1 via RESPIRATORY_TRACT
  Administered 2021-11-19: 2 via RESPIRATORY_TRACT
  Filled 2021-11-18: qty 6.7

## 2021-11-18 MED ORDER — VALACYCLOVIR HCL 500 MG PO TABS
1000.0000 mg | ORAL_TABLET | Freq: Two times a day (BID) | ORAL | Status: DC
  Administered 2021-11-18 – 2021-11-20 (×4): 1000 mg via ORAL
  Filled 2021-11-18 (×7): qty 2

## 2021-11-18 MED ORDER — VENLAFAXINE HCL ER 75 MG PO CP24
75.0000 mg | ORAL_CAPSULE | Freq: Every day | ORAL | Status: DC
  Administered 2021-11-19 – 2021-11-20 (×2): 75 mg via ORAL
  Filled 2021-11-18: qty 1
  Filled 2021-11-18: qty 7
  Filled 2021-11-18 (×2): qty 1

## 2021-11-18 NOTE — Progress Notes (Signed)
? ? ?   11/17/21 2045  ?Psych Admission Type (Psych Patients Only)  ?Admission Status Voluntary  ?Psychosocial Assessment  ?Patient Complaints Anxiety  ?Eye Contact Brief  ?Facial Expression Flat;Sad;Anxious  ?Affect Depressed;Anxious  ?Speech Soft;Logical/coherent  ?Interaction Cautious;Forwards little;Minimal  ?Motor Activity Other (Comment) ?(WNL)  ?Appearance/Hygiene Unremarkable  ?Behavior Characteristics Cooperative;Appropriate to situation  ?Mood Pleasant;Anxious  ?Thought Process  ?Coherency WDL  ?Content WDL  ?Delusions None reported or observed  ?Perception WDL  ?Hallucination None reported or observed  ?Judgment Impaired  ?Confusion None  ?Danger to Self  ?Current suicidal ideation? Denies  ?Self-Injurious Behavior No self-injurious ideation or behavior indicators observed or expressed   ?Agreement Not to Harm Self Yes  ?Description of Agreement verbal contract for safety  ?Danger to Others  ?Danger to Others None reported or observed  ? ? ?

## 2021-11-18 NOTE — [facility] Suicide Risk Assessment (Signed)
[facility] INPATIENT:  Family/Significant Other Suicide Prevention Education ? ?Suicide Prevention Education:  ?Education Completed; Melissa [name] [phone] (Mother) has been identified by the patient as the family member/significant other with whom the patient will be residing, and identified as the person(s) who will aid the patient in the event of a mental health crisis (suicidal ideations/suicide attempt).  With written consent from the patient, the family member/significant other has been provided the following suicide prevention education, prior to the and/or following the discharge of the patient. ? ?The suicide prevention education provided includes the following: ?Suicide risk factors ?Suicide prevention and interventions ?National Suicide Hotline telephone number ?[facility] assessment telephone number ?[facility] Emergency Assistance 911 ?[place] and/or Residential Mobile Crisis Unit telephone number ? ?Request made of family/significant other to: ?Remove weapons (e.g., guns, rifles, knives), all items previously/currently identified as safety concern.   ?Remove drugs/medications (over-the-counter, prescriptions, illicit drugs), all items previously/currently identified as a safety concern. ? ?The family member/significant other verbalizes understanding of the suicide prevention education information provided.  The family member/significant other agrees to remove the items of safety concern listed above. ? ?CSW spoke with Mr. Gavin Potters who states that her daughter has 2 minor children with health concerns and has not been hands on with them since their births due to Postpartum Depression.  She states that her daughter becomes easily frustrated and overwhelmed when attempting to care of the children and often leaves this work to the other family members.  She states that her daughter has a lot of support at that the home.  Mrs. Gavin Potters states that her daughter came to the hospital  because she was upset due to a comment her father made about asking her to help with her own children.  She states that her daughter then made some passive suicidal comments and threatened to leave the home in the middle of the night.  She states that her daughter has made previous suicidal statements to her and homicidal statements about her and her father.  Mrs. Gavin Potters states that her daughter can return home after discharge and states that all firearms and weapons have been removed and locked up.  She states that she will continue to be a support and would like her daughter to be provided with as many resources as possible prior to discharge.  She states that she is also concerns about her daughters gambling addiction and states that "my daughter often worries more about things such as money and transportation then she does about her own health".  CSW completed SPE with Mrs. Gavin Potters.  ? ?Darleen Crocker ?11/18/2021, 10:18 AM ?

## 2021-11-18 NOTE — Group Note (Signed)
LCSW Group Therapy Note ? ? ?Group Date: 11/18/2021 ?Start Time: 1300 ?End Time: 1400 ? ?Topic: Problem Solving  ?  ?Participation: Active  ?  ?Due to the illness on the unit, Infectious Disease has recommended no close contact at this time, therefore group was not held. Patient was provided therapeutic worksheets and asked to meet with CSW to discuss those worksheets as needed.  ? ?Ezrah Dembeck M Najla Aughenbaugh, LCSWA ?11/18/2021  1:19 PM   ? ?

## 2021-11-18 NOTE — Progress Notes (Signed)
?   11/18/21 0816  ?Psych Admission Type (Psych Patients Only)  ?Admission Status Voluntary  ?Psychosocial Assessment  ?Patient Complaints Anxiety;Depression  ?Eye Contact Brief  ?Facial Expression Anxious;Sad;Flat  ?Affect Anxious;Depressed  ?Speech Soft;Logical/coherent  ?Interaction Cautious;Minimal  ?Motor Activity Other (Comment) ?(Steady)  ?Appearance/Hygiene Unremarkable  ?Behavior Characteristics Cooperative;Appropriate to situation  ?Mood Sad;Anxious  ?Thought Process  ?Coherency WDL  ?Content WDL  ?Delusions None reported or observed  ?Perception WDL  ?Hallucination None reported or observed  ?Judgment Impaired  ?Confusion None  ?Danger to Self  ?Current suicidal ideation? Denies  ?Self-Injurious Behavior No self-injurious ideation or behavior indicators observed or expressed   ?Agreement Not to Harm Self Yes  ?Description of Agreement Verbal contract for safety  ?Danger to Others  ?Danger to Others None reported or observed  ? ?Patient is alert and oriented to place , person and situation during shift assessment.  Patient denies SI/HI/AVH. Patient is compliant with routine medication, tolerated them well with no side effect noted. Patient denies pain and discomfort. Patient stays in room all day and comes out for medication, patient affect is flat, appearance is appropriate. No distress noted at this time,  Q 15 minutes safety observation in place. Staff will continue to support monitor patient in the milieu. ? ?
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
## 2021-11-18 NOTE — [facility] Counselor (Signed)
CSW provided the Pt with a packet that contains information including shelter and housing resources, free and reduced price food information, clothing resources, crisis center information, a GoodRX card, and suicide prevention information.  CSW also provided the Pt with community resources for children and parents, a list of parenting groups, and information about postpartum depression.  ?
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
## 2021-11-18 NOTE — Hospital Course (Signed)
Melissa [name] is  21 y/o female with h/o DMDD, MDD, mild IDD, postpartum depression , GAD, seizure disorder initially presented to [facility] from [name] ED for worsening depression, anxiety and suicidal ideations. Melissa [name] has 2 children, a 45-year-old and a 70-month-old.  2 previous suicidal attempt by overdosing on seizure medication and cutting.  She also has gambling addiction.  She signed 72 hours discharge request  which was changed to voluntary.  Patient was started on Effexor.  ? ?

## 2021-11-18 NOTE — Plan of Care (Signed)
  Problem: Education: Goal: Emotional status will improve Outcome: Not Progressing Goal: Mental status will improve Outcome: Not Progressing   

## 2021-11-18 NOTE — Plan of Care (Signed)
?  Problem: Activity: ?Goal: Interest or engagement in activities will improve ?Outcome: Progressing ?Goal: Sleeping patterns will improve ?Outcome: Progressing ?  ?Problem: Coping: ?Goal: Ability to verbalize frustrations and anger appropriately will improve ?Outcome: Progressing ?Goal: Ability to demonstrate self-control will improve ?Outcome: Progressing ?  ?Problem: Safety: ?Goal: Periods of time without injury will increase ?Outcome: Progressing ?  ?

## 2021-11-19 LAB — HEMOGLOBIN A1C
Hgb A1c MFr Bld: 4.9 % (ref 4.8–5.6)
Mean Plasma Glucose: 93.93 mg/dL

## 2021-11-19 LAB — LIPID PANEL
Cholesterol: 158 mg/dL (ref 0–200)
HDL: 36 mg/dL — ABNORMAL LOW (ref >40)
LDL Cholesterol: 105 mg/dL — ABNORMAL HIGH (ref 0–99)
Total CHOL/HDL Ratio: 4.4 RATIO
Triglycerides: 83 mg/dL (ref <150)
VLDL: 17 mg/dL (ref 0–40)

## 2021-11-19 MED ORDER — HALOPERIDOL 5 MG PO TABS
5.0000 mg | ORAL_TABLET | Freq: Every day | ORAL | Status: DC
  Administered 2021-11-19: 5 mg via ORAL
  Filled 2021-11-19 (×2): qty 1
  Filled 2021-11-19: qty 7
  Filled 2021-11-19: qty 1

## 2021-11-19 MED ORDER — ONDANSETRON HCL 4 MG PO TABS
4.0000 mg | ORAL_TABLET | Freq: Once | ORAL | Status: AC
  Administered 2021-11-19: 4 mg via ORAL
  Filled 2021-11-19 (×2): qty 1

## 2021-11-19 NOTE — Progress Notes (Signed)
D- Patient alert and oriented x4. Pt verbalized she feels dizzy and lightheaded. Pt denied pain and stated she has not had a sickle cell crisis in years. Pt ambulatory with steady gait, skin warm and dry. No acute distress noted.  ? ? ?A- Vitals taken and are WDL. Pt also given fluids (water and Gatorade to drink). Pt explained to make nurse aware of symptoms worsen or additional symptoms arise.  ?  ?R- Patient verbalized understanding treatment plan. Patient receptive, calm, and cooperative.   ?

## 2021-11-19 NOTE — Progress Notes (Signed)
The evening group did not occur this evening due to the ongoing virus on the unit.  ?

## 2021-11-19 NOTE — Progress Notes (Signed)
[facility] ? ?11/19/2021 4:12 PM ?Melissa JourneyBryana M [name]  ?MRN:  [id number] ? ?Subjective: "I feel better than I did when I came in here 2 days ago."   ?  ?Brief History:  Melissa RutherfordBryana M. [name] is a 21 year old female with past psychiatric history of  DMDD, Mild IDD, Depression, Anxiety, GAD, Post partum depression with medical history of  Seizure ( has not had since 2015), Asthma, Herpes simplex, Sickle cell trait [facility] and [facility] to worsening depression and SI. He was assessed by psychiatry and was recommended for inpatient psychiatric admission.  [facility]. ?  ?Daily Notes 11/19/21: Patient is assessed in her room sitting up in her bed. Chart is reviewed and findings shared with the treatment team and discussed with Dr. Viviano SimasMaurer. Patient is alert and oriented to person, place, time and situation. Able to maintain fair eye contact with this provider. Speech is clear with normal pattern and volume. Affect/Mood is coherent, anxious and depressed. Thought process is coherent and linear and thought content is  and WNL.  ? ?Patient endorsed good appetite and consuming 100% of her meals. Reported sleeping up to 8 hours last night. Complaint of feeling slightly dizzy. Encourage to report to the nurse if the dizziness continues. Denies SI/HI/AVH, however, reported increased thinking about death children. Haldol 5 mg po daily was initiated today. Will monitor effect. Rated anxiety as "0" and depression as"0" on a scale of 0 to 10. Patient rescind her 72 hours today at 10:47 AM.  Current abnormal labs of 11/19/21 include HDL 36, LDLcalc 105. ? ?Principal Problem: Post-partum depression ? ?Diagnosis: Principal Problem: ?  Post-partum depression ? ?Total Time spent with patient: 30 minutes ? ?Past Psychiatric History: Previous Psych Diagnoses: DMDD, Mild IDD, Depression and Anxiety, GAD, Post partum depression 2 years  ago. ?Prior inpatient treatment: when 21 years old admitted to inpatient due to overdose on seizure meds. When 21 years old [facility] and SI.  ?Current/prior outpatient treatment: she is  trying to find one in January who can come to her house as she doesn't have a car.  ? ?Past Medical History:  ?Past Medical History:  ?Diagnosis Date  ? Anemia   ? Anxiety   ? Asthma   ? inhaler. last attack June 2019  ? Congenital hydronephrosis 2001  ? Constipation   ? Depression   ? Episodic tension-type headache, not intractable 04/16/2015  ? Family history of adverse reaction to anesthesia   ? mom rushed to hospital from Dentist office  ? Genital herpes   ? Low back strain, sequela 11/10/2020  ? Migraine without aura and without status migrainosus, not intractable 04/16/2015  ? PID (pelvic inflammatory disease)   ? Reactive airway disease in pediatric patient   ? Rh negative state in antepartum period 02/05/2019  ? Seizures ([name])   ? last one in 2015 - on meds  ? Sickle cell trait ([name])   ? TBI (traumatic brain injury) 2006  ?  ?Past Surgical History:  ?Procedure Laterality Date  ? APPENDECTOMY    ? CESAREAN SECTION N/A 08/31/2021  ? Procedure: CESAREAN SECTION;  Surgeon: Kathrynn RunningWouk, Noah Bedford, MD;  Location: [facility] LD ORS;  Service: Obstetrics;  Laterality: N/A;  ? HERNIA REPAIR    ? INTESTINAL MALROTATION REPAIR  2001  ? ?Family History:  ?Family History  ?Problem Relation Age of Onset  ?  Depression Mother   ? Stroke Mother   ? Obesity Mother   ? Post-traumatic stress disorder Mother   ? Anxiety disorder Mother   ? Hypertension Mother   ? Diabetes Mother   ? Schizophrenia Father   ? Kidney disease Father   ? ?Family Psychiatric  History: Psych: Mom- Depression, anxiety , panic attacks  ?Dad- schizophrenia - Bipolar ?Psych Rx: None ?SA/HA: none ?Substance use family hx: Grandfather - Crack. GM- Crack, younger sister- cocaine ?  ?Social History:  ?Social History  ? ?Substance and Sexual Activity  ?Alcohol  Use Yes  ? Comment: occasionally  ?   ?Social History  ? ?Substance and Sexual Activity  ?Drug Use Not Currently  ? Types: Marijuana  ? Comment: last 2020  ?  ?Social History  ? ?Socioeconomic History  ? Marital status: Single  ?  Spouse name: Not on file  ? Number of children: Not on file  ? Years of education: Not on file  ? Highest education level: Not on file  ?Occupational History  ? Not on file  ?Tobacco Use  ? Smoking status: Every Day  ?  Types: Cigarettes  ? Smokeless tobacco: Never  ? Tobacco comments:  ?  black and milds, none since + UPT  ?Vaping Use  ? Vaping Use: Never used  ?Substance and Sexual Activity  ? Alcohol use: Yes  ?  Comment: occasionally  ? Drug use: Not Currently  ?  Types: Marijuana  ?  Comment: last 2020  ? Sexual activity: Yes  ?  Birth control/protection: Condom  ?Other Topics Concern  ? Not on file  ?Social History Narrative  ? 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  ? She attended [employer].   ? She lives with her parents, siblings, and grandfather.   ? She enjoys writing, singing, dancing, cooking, and shopping.  ? She attends [employer].  ? ?Social Determinants of Health  ? ?Financial Resource Strain: Not on file  ?Food Insecurity: Not on file  ?Transportation Needs: Not on file  ?Physical Activity: Not on file  ?Stress: Not on file  ?Social Connections: Not on file  ? ?Additional Social History:  ?  ?Sleep: Good ? ?Appetite:  Good ? ?Current Medications: ?Current Facility-Administered Medications  ?Medication Dose Route Frequency Provider Last Rate Last Admin  ? acetaminophen (TYLENOL) tablet 650 mg  650 mg Oral Q6H PRN Lenard Lance, FNP      ? albuterol (VENTOLIN HFA) 108 (90 Base) MCG/ACT inhaler 1-2 puff  1-2 puff Inhalation Q4H PRN Nira Conn A, NP   2 puff at 11/18/21 1414  ? alum & mag hydroxide-simeth (MAALOX/MYLANTA) 200-200-20 MG/5ML suspension 30 mL  30 mL Oral Q4H PRN Lenard Lance, FNP      ? haloperidol (HALDOL) tablet 5 mg  5 mg Oral QHS Heleena Miceli, Jesusita Oka,  FNP      ? hydrOXYzine (ATARAX) tablet 25 mg  25 mg Oral TID PRN Lenard Lance, FNP   25 mg at 11/18/21 2148  ? LORazepam (ATIVAN) injection 2 mg  2 mg Intramuscular BID PRN Karsten Ro, MD      ? magnesium hydroxide (MILK OF MAGNESIA) suspension 30 mL  30 mL Oral Daily PRN Lenard Lance, FNP   30 mL at 11/18/21 2149  ? valACYclovir (VALTREX) tablet 1,000 mg  1,000 mg Oral BID Karsten Ro, MD   1,000 mg at 11/19/21 0846  ? venlafaxine XR (EFFEXOR-XR) 24 hr capsule 75 mg  75  mg Oral Q breakfast Karsten Ro, MD   75 mg at 11/19/21 0846  ? ? ?Lab Results:  ?Results for orders placed or performed during the hospital encounter of 11/17/21 (from the past 48 hour(s))  ?Hemoglobin A1c     Status: None  ? Collection Time: 11/19/21  6:30 AM  ?Result Value Ref Range  ? Hgb A1c MFr Bld 4.9 4.8 - 5.6 %  ?  Comment: REPEATED TO VERIFY ?(NOTE) ?Pre diabetes:          5.7%-6.4% ? ?Diabetes:              >6.4% ? ?Glycemic control for   <7.0% ?adults with diabetes ?  ? Mean Plasma Glucose 93.93 mg/dL  ?  Comment: Performed at [facility], [address] [place], [place] [address]  ?Lipid panel     Status: Abnormal  ? Collection Time: 11/19/21  6:30 AM  ?Result Value Ref Range  ? Cholesterol 158 0 - 200 mg/dL  ? Triglycerides 83 <150 mg/dL  ? HDL 36 (L) >40 mg/dL  ? Total CHOL/HDL Ratio 4.4 RATIO  ? VLDL 17 0 - 40 mg/dL  ? LDL Cholesterol 105 (H) 0 - 99 mg/dL  ?  Comment:        ?Total Cholesterol/HDL:CHD Risk ?Coronary Heart Disease Risk Table ?                    Men   Women ? 1/2 Average Risk   3.4   3.3 ? Average Risk       5.0   4.4 ? 2 X Average Risk   9.6   7.1 ? 3 X Average Risk  23.4   11.0 ?       ?Use the calculated Patient Ratio ?above and the CHD Risk Table ?to determine the patient's CHD Risk. ?       ?ATP III CLASSIFICATION (LDL): ? <100     mg/dL   Optimal ? 606-301  mg/dL   Near or Above ?                   Optimal ? 130-159  mg/dL   Borderline ? 160-189  mg/dL   High ? >601     mg/dL   Very  High ?Performed at [facility], [address], [place], [place] [address] ?  ? ? ?Blood Alcohol level:  ?Lab Results  ?Component Value Date  ? ETH <10 11/16/2021  ? ETH <5 12/06/2016  ? ? ?Metabolic Judi Cong

## 2021-11-19 NOTE — Progress Notes (Addendum)
Pt is A&OX4, calm, minimal, depressed, denies suicidal ideations, denies homicidal ideations, denies auditory hallucinations and denies visual hallucinations. Pt denies experiencing nightmares. Mood and affect are congruent. Pt appetite is ok. No complaints of distress, pain and/or discomfort at this time. Pt's memory appears to be grossly intact, and Pt hasn?t displayed any injurious behaviors. Pt is medication compliant. There?s no evidence of suicidal intent. Psychomotor activity was WNL. No s/s of Parkinson, Dystonia, Akathisia and/or Tardive Dyskinesia noted. ? ?Writer was notified by physician, pt has signed NEW 72 hour form.  ?

## 2021-11-19 NOTE — Progress Notes (Signed)
Pt stated he recently started feeling nauseous in addition to her other symptoms she verbalized earlier. Provider notified. VORB for Zofran 4mg  once.  ?

## 2021-11-19 NOTE — BHH Group Notes (Signed)
Group Date: 11/19/2021 ?Start Time: 10:00am ?End Time: 11:00am ?  ?  ?Type of Therapy and Topic:  Group Therapy: Anger ?  ?Participation Level:  Active ?  ?Description of Group:   Due to the illness on the unit, Infectious Disease has recommended no close contact at this time so group was not held.  Patient was provided with written information and worksheets about anger.  The patient was with a doctor so information was not reviewed by CSW. ?  ?  ?  ?Ambrose Mantle, LCSW ?11/19/2021 ?

## 2021-11-19 NOTE — Progress Notes (Signed)
?   11/19/21 0100  ?Psych Admission Type (Psych Patients Only)  ?Admission Status Voluntary  ?Psychosocial Assessment  ?Patient Complaints Depression  ?Eye Contact Fair  ?Facial Expression Sad;Flat  ?Affect Depressed  ?Speech Logical/coherent  ?Interaction Assertive  ?Motor Activity Slow  ?Appearance/Hygiene In hospital gown  ?Behavior Characteristics Appropriate to situation;Cooperative  ?Mood Depressed  ?Thought Process  ?Coherency WDL  ?Content WDL  ?Delusions None reported or observed  ?Perception WDL  ?Hallucination None reported or observed  ?Judgment WDL  ?Confusion None  ?Danger to Self  ?Current suicidal ideation? Denies  ?Self-Injurious Behavior No self-injurious ideation or behavior indicators observed or expressed   ?Agreement Not to Harm Self Yes  ?Description of Agreement verbal  ?Danger to Others  ?Danger to Others None reported or observed  ? ? ?

## 2021-11-20 ENCOUNTER — Encounter (HOSPITAL_COMMUNITY): Payer: Self-pay | Admitting: Family

## 2021-11-20 DIAGNOSIS — F53 Postpartum depression: Secondary | ICD-10-CM

## 2021-11-20 MED ORDER — HYDROXYZINE HCL 25 MG PO TABS: 25.0000 mg | ORAL_TABLET | Freq: Three times a day (TID) | ORAL | 0 refills | Status: DC | PRN

## 2021-11-20 MED ORDER — VENLAFAXINE HCL ER 75 MG PO CP24: 75.0000 mg | ORAL_CAPSULE | Freq: Every day | ORAL | 0 refills | Status: DC

## 2021-11-20 MED ORDER — PREPLUS 27-1 MG PO TABS: 1.0000 | ORAL_TABLET | Freq: Every day | ORAL | 13 refills | Status: DC

## 2021-11-20 MED ORDER — FERROUS SULFATE 325 (65 FE) MG PO TABS: 325.0000 mg | ORAL_TABLET | Freq: Every evening | ORAL | 3 refills | Status: DC

## 2021-11-20 MED ORDER — HALOPERIDOL 5 MG PO TABS: 5.0000 mg | ORAL_TABLET | Freq: Every day | ORAL | 0 refills | Status: DC

## 2021-11-20 NOTE — Progress Notes (Signed)
?  [facility] Adult Case Management Discharge Plan : ? ?Will you be returning to the same living situation after discharge:  Yes,  with mother. ?At discharge, do you have transportation home?: Yes,  per mother. ?Do you have the ability to pay for your medications: Yes,  has active coverage. ? ?Release of information consent forms completed and in the chart;  Patient's signature needed at discharge. ? ?Patient to Follow up at: ? Follow-up Information   ? ? [place]. Go to.   ?Specialty: Behavioral Health ?Why: Please go to this provider for therapy and medication management services during walk in hours:  Mondays and Wednesdays, arrive by 7:30 am.  Services are provided on a first come, first served basis. ?Contact information: ?[address] ?[place] [address] ?[phone] ? ?  ?  ? ?  ?  ? ?  ? ? ?Next level of care provider has access to [facility] ? ?Safety Planning and Suicide Prevention discussed: Yes,  with mother [name] on 3/31 ? ?  ? ?Has patient been referred to the Quitline?: Patient refused referral ? ?Patient has been referred for addiction treatment: N/A ? ?Baird Kay, LCSWA ?11/20/2021, 9:41 AM ?

## 2021-11-20 NOTE — BHH Suicide Risk Assessment (Signed)
[facility] Discharge Suicide Risk Assessment ? ? ?Principal Problem: Post-partum depression ?Discharge Diagnoses: Principal Problem: ?  Post-partum depression ? ? ?Total Time spent with patient: 1 hour ? ?Patient seen prior to discharge.  Patient verbalizes that prior to admission she was having voices telling her that she "does not belong here".  She states that these last voices were heard 10/24/2021.  She states that she previously had seen images of dead babies.  She specifically denies visual hallucinations or delusions of diabetes currently.  She relates that images will be more prevalent after her last pregnancy.  Patient admits that she was having difficulty sleeping prior to admission which caused her to have a more irritable mood and worsen her depression.  She was started on Haldol while hospitalized which has been effective in decreasing any residual psychotic symptoms and ruminations.  She slept 8 hours last night and feels refreshed.  She reports a good appetite.  She denies any auditory or visual hallucinations.  She specifically denies any suicidal ideation, plan, or intent.  She denies any homicidal ideation or thoughts of harming others, specifically her children. ?A safety planning call with patient's mother, [name] was completed.  Her states that she is aware of patient's symptoms, and notes that patient is still traumatized by seeing her 70-week-old fetus that she miscarried 1 year ago.  Mother states that patient is never alone with her children and that she is always supervised by herself or patient's adult sisters.  Provided education regarding medications, Haloperidol and venlafaxine.  Reviewed Haldol should be given in the evening after she returns home from work, and venlafaxine in the morning upon awakening.  Mother is encouraged that patient was able to get a good night's sleep, and verbalizes understanding that patient's sleep will need to continue to be a priority.  Mother states that  patient has an appointment on April 4 for a GYN appointment for long-term birth control management.  Mother will be assisting patient in keeping a schedule when she returns to work. ?Mother is aware to bring patient back to nearest Emergency Room or urgent care should she have any concerns for patient's safety or safety of others including her children. ? ? ?Musculoskeletal: ?Strength & Muscle Tone: within normal limits ?Gait & Station: normal ?Patient leans: N/A ? ?Psychiatric Specialty Exam ? ?Presentation  ?General Appearance: Appropriate for Environment; Casual; Fairly Groomed ? ?Eye Contact:Good ? ?Speech:Clear and Coherent; Normal Rate ? ?Speech Volume:Normal ? ?Handedness:Right ? ? ?Mood and Affect  ?Mood:Euthymic ? ?Duration of Depression Symptoms: Greater than two weeks ? ?Affect:Appropriate; Congruent ? ? ?Thought Process  ?Thought Processes:Coherent; Linear ? ?Descriptions of Associations:Intact ? ?Orientation:Full (Time, Place and Person) ? ?Thought Content:Logical ? ?History of Schizophrenia/Schizoaffective disorder:No ? ?Duration of Psychotic Symptoms:No data recorded ?Hallucinations:Hallucinations: None ? ?Ideas of Reference:None ? ?Suicidal Thoughts:Suicidal Thoughts: No ? ?Homicidal Thoughts:Homicidal Thoughts: No ? ? ?Sensorium  ?Memory:Immediate Good; Recent Fair; Remote Good ? ?Judgment:Good ? ?Insight:Fair ? ? ?Executive Functions  ?Concentration:Fair ? ?Attention Span:Fair ? ?Recall:Fair ? ?Fund of Knowledge:Fair ? ?Language:Fair ? ? ?Psychomotor Activity  ?Psychomotor Activity:Psychomotor Activity: Normal ? ? ?Assets  ?Assets:Communication Skills; Desire for Improvement; Financial Resources/Insurance; Housing; Physical Health; Resilience; Vocational/Educational; Social Support ? ? ?Sleep  ?Sleep:Sleep: Good ?Number of Hours of Sleep: 8 ? ? ?Physical Exam: ?Physical Exam ?Constitutional:   ?   Appearance: Normal appearance.  ?Cardiovascular:  ?   Rate and Rhythm: Normal rate.  ?Pulmonary:  ?    Effort: Pulmonary effort is normal. No respiratory  distress.  ?Musculoskeletal:     ?   General: Normal range of motion.  ?Neurological:  ?   General: No focal deficit present.  ?   Mental Status: She is alert and oriented to person, place, and time.  ? ?Review of Systems  ?Constitutional: Negative.   ?Respiratory: Negative.    ?Cardiovascular: Negative.   ?Psychiatric/Behavioral:  Negative for depression, hallucinations, substance abuse and suicidal ideas. The patient is not nervous/anxious and does not have insomnia.   ?Blood pressure 107/79, pulse 94, temperature 98.4 ?F (36.9 ?C), temperature source Oral, resp. rate 18, height 5\' 6"  (1.676 m), weight 94.8 kg, SpO2 99 %, not currently breastfeeding. Body mass index is 33.73 kg/m?. ? ?Mental Status Per Nursing Assessment::   ?On Admission:  NA ? ?Demographic Factors:  ?Adolescent or young adult ? ?Loss Factors: ?Miscarriage 1 year ago ? ?Historical Factors: ?Prior suicide attempts and Family history of mental illness or substance abuse ? ?Risk Reduction Factors:   ?Responsible for children under 2 years of age, Sense of responsibility to family, Employed, Living with another person, especially a relative, Positive social support, Positive therapeutic relationship, and Positive coping skills or problem solving skills ? ?Continued Clinical Symptoms:  ?Postpartum Depression ? ?Cognitive Features That Contribute To Risk:  ?None   ? ?Suicide Risk:  ?Minimal: No identifiable suicidal ideation.  Patients presenting with no risk factors but with morbid ruminations; may be classified as minimal risk based on the severity of the depressive symptoms ? ? Follow-up Information   ? ? [facility]. Go to.   ?Specialty: Behavioral Health ?Why: Please go to this provider for therapy and medication management services during walk in hours:  Mondays and Wednesdays, arrive by 7:30 am.  Services are provided on a first come, first served basis. ?Contact  information: ?[address] ?[place] ?[phone] ? ?  ?  ? ?  ?  ? ?  ? ? ?Plan Of Care/Follow-up recommendations:  ?Activity:  Ad lib ?Diet:  as tolerated ? ?On day of discharge following sustained improvement in the affect of this patient, continued report of euthymic mood, repeated denial of suicidal, homicidal, and other violent ideation, adequate interaction with peers, active participation in groups while on the unit, and denial of adverse reactions from medications, the treatment team decided Melissa [name] was stable for discharge home with scheduled mental health treatment as noted above. ? ?She was able to engage in safety planning including plan to return to nearest emergency room or contact emergency services if she feels unable to maintain her own safety or the safety of others. Patient had no further questions, comments, or concerns.  ?Discharge into care of her mother, who agrees to maintain patient safety. ? ?Patient aware to return to nearest crisis center, ED or to call 911 for worsening symptoms of depression, suicidal or homicidal thoughts or AVH. ? ? ?Arliss Journey, MD ?11/20/2021, 12:53 PM ?

## 2021-11-20 NOTE — Group Note (Signed)
LCSW Group Therapy ? ? ?CSW group not facilitated due to unit limitations on patient proximity/interactions due to infection prevention measures. ? ?Melissa [name] T Melissa [name] LCSWA  ?10:39 AM  ?
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
## 2021-11-20 NOTE — Progress Notes (Signed)
Pt is A&OX4, calm, denies suicidal ideations, denies homicidal ideations, denies auditory hallucinations and denies visual hallucinations. Pt denies wanting to harm herself, her children, and/or anyone else. Pt reports sleeping well and denies experiencing nightmares last night.  Mood and affect are congruent. Pt appetite is ok. No complaints of distress, pain and/or discomfort at this time. Pt's memory appears to be grossly intact, and Pt hasn?t displayed any injurious behaviors. Pt is medication compliant. There?s no evidence of suicidal intent. Psychomotor activity was WNL. No s/s of Parkinson, Dystonia, Akathisia and/or Tardive Dyskinesia noted. ?

## 2021-11-20 NOTE — Progress Notes (Signed)
Discharge Note: ?All possessions/property returned to patient and signature obtained. Verbal and written education provided to patient regarding follow up care, treatment plan and discharge medications. Pt verbalized comprehension. Pt currently denies suicidal ideations, denies homicidal ideations, denies visual hallucination, and denies auditory hallucinations. No c/o pain and/or discomfort. All precautions discontinued by physician. Pt remains calm, cooperative, medication-compliant, ambulatory and functioning at Pt's optimal level. There are no indications that this Pt will engaged in self-directed violent behavior, either preparatory or potentially harmful. Pt went home with her mother.  ?

## 2021-11-21 ENCOUNTER — Encounter (HOSPITAL_COMMUNITY): Payer: Self-pay | Admitting: *Deleted

## 2021-11-21 ENCOUNTER — Encounter (HOSPITAL_COMMUNITY): Payer: Self-pay

## 2021-11-21 ENCOUNTER — Ambulatory Visit (HOSPITAL_COMMUNITY)
Admission: EM | Admit: 2021-11-21 | Discharge: 2021-11-21 | Disposition: A | Discharge status: Other Acute Inpatient Hospital | Payer: Medicaid Other

## 2021-11-21 ENCOUNTER — Other Ambulatory Visit: Payer: Self-pay

## 2021-11-21 ENCOUNTER — Emergency Department (HOSPITAL_COMMUNITY)
Admission: EM | Admit: 2021-11-21 | Discharge: 2021-11-21 | Disposition: A | Discharge status: Home / Self Care | Payer: Medicaid Other | Attending: Emergency Medicine | Admitting: Emergency Medicine

## 2021-11-21 DIAGNOSIS — Z79899 Other long term (current) drug therapy: Secondary | ICD-10-CM | POA: Diagnosis not present

## 2021-11-21 DIAGNOSIS — G2402 Drug induced acute dystonia: Secondary | ICD-10-CM | POA: Insufficient documentation

## 2021-11-21 DIAGNOSIS — Z91040 Latex allergy status: Secondary | ICD-10-CM | POA: Diagnosis not present

## 2021-11-21 DIAGNOSIS — T7840XA Allergy, unspecified, initial encounter: Secondary | ICD-10-CM | POA: Diagnosis present

## 2021-11-21 MED ORDER — BENZTROPINE MESYLATE 1 MG/ML IJ SOLN
2.0000 mg | Freq: Once | INTRAMUSCULAR | Status: AC
  Administered 2021-11-21: 2 mg via INTRAMUSCULAR
  Filled 2021-11-21: qty 2

## 2021-11-21 NOTE — ED Notes (Signed)
DR Marlinda Mike to eval PT ?

## 2021-11-21 NOTE — ED Notes (Signed)
TC to ED with report ?

## 2021-11-21 NOTE — ED Triage Notes (Signed)
BIB EMS from urgent care for swelling in mouth, throat, and tension in neck. Pt started a new haldol script and woke up this afternoon w/swelling in mouth. She reports an allergy to benadryl. Maintains O2 sats at 100% on room air.  ?

## 2021-11-21 NOTE — ED Triage Notes (Signed)
Pt was started on a new med while at [facility]  a couple of days ago. Pt presents today with reported allergic reaction and throat swelling ?

## 2021-11-21 NOTE — ED Notes (Signed)
Notified [facility] of need for transport ?

## 2021-11-21 NOTE — ED Notes (Signed)
PT took last dose of Haldol this morning. Pt was instructed to come to [facility] for treatment. ?

## 2021-11-21 NOTE — Discharge Instructions (Addendum)
Please call your psychiatrist and do not take Haldol. ?Return if you are having worsening symptoms. ?

## 2021-11-21 NOTE — Progress Notes (Signed)
Patient ID: Melissa [name], female   DOB: Jan 02, 2001, 21 y.o.   MRN: [id number] ?Call transferred by Nurse Tech to this writer, and person on phone identified themselves as this patient by correcting stating their first, last names and DOB. She states that she was discharged from this [facility] yesterday on Haldol, and stated that her jaw is "locking up". As call progressed, she handed the phone to another person who identified themselves as this pt's mother. She repeated that pt's jaw is "locking up", and stated that she read that a side effect of one of the medications being taken by pt is a locked jaw. This Probation officer reviewed discharge medications in discharge summary, and pt was not discharged home on Cogentin. Given that pt might be suffering from EPS, and is allergic to Benadryl, pt's mother was educated to immediately take pt to the [facility] ER for IM Cogentin so that the condition does not worsen.  She stated that this is the closest ER to where they live, and verbalized understanding, stated she would take her there immediately, and call ended. ?

## 2021-11-21 NOTE — ED Notes (Signed)
Pt having neck muscle spasm  ?

## 2021-11-21 NOTE — ED Provider Notes (Signed)
?[facility] ?Provider Note ? ? ?CSN: [id number] ?Arrival date & time: 11/21/21  1525 ? ?  ? ?History ? ?Chief Complaint  ?Patient presents with  ? Allergic Reaction  ? ? ?Melissa [name] is a 21 y.o. female. ? ?HPI ?21 year old female recently started on Haldol presents today complaining of tongue spasms.  She was seen in urgent care.  She seemed to be having a spasm of her neck complain her chin back and head back.  She was complaining of some feelings of swelling in her tongue.Marland Kitchen  She was breathing without any stridor or wheezing.  She is able to stick her tongue out.  She is able to speak in complete sentences.  She was sent to the emergency department for Cogentin and evaluation and treatment. ?  ? ?Home Medications ?Prior to Admission medications   ?Medication Sig Start Date End Date Taking? Authorizing Provider  ?acetaminophen (TYLENOL) 325 MG tablet Take 650 mg by mouth every 6 (six) hours as needed.    [provider]  ?albuterol (VENTOLIN HFA) 108 (90 Base) MCG/ACT inhaler Inhale 1-2 puffs into the lungs every 4 (four) hours as needed for wheezing or shortness of breath. ?Patient not taking: Reported on 11/17/2021 10/07/21   Barrett Henle, MD  ?ferrous sulfate 325 (65 FE) MG tablet Take 1 tablet (325 mg total) by mouth at bedtime. 11/20/21   Lavella Hammock, MD  ?haloperidol (HALDOL) 5 MG tablet Take 1 tablet (5 mg total) by mouth at bedtime. 11/20/21   Lavella Hammock, MD  ?hydrOXYzine (ATARAX) 25 MG tablet Take 1 tablet (25 mg total) by mouth 3 (three) times daily as needed for anxiety. 11/20/21   Lavella Hammock, MD  ?loratadine (CLARITIN) 10 MG tablet Take 1 tablet (10 mg total) by mouth daily. 09/25/21   Gavin Pound, CNM  ?polyethylene glycol (MIRALAX / GLYCOLAX) 17 g packet Take 17 g by mouth daily as needed for moderate constipation or mild constipation.    [provider]  ?Prenatal Vit-Fe Fumarate-FA (PREPLUS) 27-1 MG TABS Take 1 tablet  by mouth daily. 11/20/21   Lavella Hammock, MD  ?valACYclovir (VALTREX) 1000 MG tablet Take 1,000 mg by mouth 2 (two) times daily.    [provider]  ?venlafaxine XR (EFFEXOR-XR) 75 MG 24 hr capsule Take 1 capsule (75 mg total) by mouth daily with breakfast. 11/21/21   Lavella Hammock, MD  ?rizatriptan (MAXALT-MLT) 10 MG disintegrating tablet Take 1 tablet at onset of migraine with 2 ibuprofen may repeat an additional tablet in 2 hours if needed 05/07/20 06/09/20  Jodi Geralds, MD  ?   ? ?Allergies    ?Benadryl [diphenhydramine hcl], Gluten meal, Shellfish allergy, Zithromax [azithromycin], Lactose intolerance (gi), and Latex   ? ?Review of Systems   ?Review of Systems  ?All other systems reviewed and are negative. ? ?Physical Exam ?Updated Vital Signs ?BP (!) 141/99 (BP Location: Right Arm)   Pulse (!) 102   Temp 97.9 ?F (36.6 ?C) (Oral)   Resp 13   Ht 1.676 m (5\' 6" )   Wt 94.8 kg   LMP 11/07/2021   SpO2 100%   BMI 33.73 kg/m?  ?Physical Exam ?Vitals and nursing note reviewed.  ?Constitutional:   ?   Appearance: Normal appearance.  ?HENT:  ?   Head: Normocephalic.  ?   Right Ear: External ear normal.  ?   Left Ear: External ear normal.  ?   Nose: Nose normal.  ?  Mouth/Throat:  ?   Mouth: Mucous membranes are moist.  ?   Pharynx: Oropharynx is clear.  ?Eyes:  ?   Extraocular Movements: Extraocular movements intact.  ?   Pupils: Pupils are equal, round, and reactive to light.  ?Neck:  ?   Comments: Patient appears to have some spasm of her neck with her chin pulled back and turned slightly to the left. ?Cardiovascular:  ?   Rate and Rhythm: Normal rate and regular rhythm.  ?Pulmonary:  ?   Effort: Pulmonary effort is normal.  ?   Breath sounds: Normal breath sounds.  ?Abdominal:  ?   General: Abdomen is flat. Bowel sounds are normal.  ?Musculoskeletal:     ?   General: Normal range of motion.  ?Skin: ?   General: Skin is warm.  ?   Capillary Refill: Capillary refill takes less than 2 seconds.   ?Neurological:  ?   General: No focal deficit present.  ?   Mental Status: She is alert.  ?   Sensory: No sensory deficit.  ?   Motor: No weakness.  ?   Coordination: Coordination normal.  ?Psychiatric:     ?   Mood and Affect: Mood is anxious.  ? ? ?ED Results / Procedures / Treatments   ?Labs ?(all labs ordered are listed, but only abnormal results are displayed) ?Labs Reviewed - No data to display ? ?EKG ?None ? ?Radiology ?No results found. ? ?Procedures ?Procedures  ? ? ?Medications Ordered in ED ?Medications  ?benztropine mesylate (COGENTIN) injection 2 mg (2 mg Intramuscular Given 11/21/21 1629)  ? ? ?ED Course/ Medical Decision Making/ A&P ?Clinical Course as of 11/21/21 1651  ?Mon Nov 21, 2021  ?1609 Cogentin and dc [MK]  ?  ?Clinical Course User Index ?[MK] Kommor, Debe Coder, MD  ? ?                        ?Medical Decision Making ?Patient recently started on Haldol.  She presents today with symptoms consistent with dystonic reaction.  She is given Cogentin IM here in the emergency department. ?Received Cogentin and symptoms have resolved.  We have discussed discontinuation of Haldol and return for any worsening symptoms.  She will follow-up with her psychiatrist. ? ? ? ? ? ? ? ? ? ?Final Clinical Impression(s) / ED Diagnoses ?Final diagnoses:  ?Dystonic drug reaction  ? ? ?Rx / DC Orders ?ED Discharge Orders   ? ? None  ? ?  ? ? ?  ?Pattricia Boss, MD ?11/21/21 1651 ? ?

## 2021-11-21 NOTE — ED Provider Notes (Signed)
Saw pt in triage. Has been on haldol since about 3/30 or 3/31. Today began having spasm in her neck, pulling her chin up and head back. Also having some spasm in her tongue. Able to breathe ok. ? ?No stridor or wheezing. Pt has her head held back, and can just stick out her tongue. Able to speak in complete sentences. ? ?I think she is having an acute dystonic reaction, and needs cogentin. We do not have it here in the [facility]; we will send her to ER for eval/tx ?  ?Barrett Henle, MD ?11/21/21 1501 ? ?

## 2021-11-22 ENCOUNTER — Ambulatory Visit (INDEPENDENT_AMBULATORY_CARE_PROVIDER_SITE_OTHER): Payer: Medicaid Other | Admitting: Obstetrics

## 2021-11-22 ENCOUNTER — Encounter: Payer: Self-pay | Admitting: Obstetrics

## 2021-11-22 DIAGNOSIS — F332 Major depressive disorder, recurrent severe without psychotic features: Secondary | ICD-10-CM

## 2021-11-22 DIAGNOSIS — B009 Herpesviral infection, unspecified: Secondary | ICD-10-CM

## 2021-11-22 DIAGNOSIS — K5901 Slow transit constipation: Secondary | ICD-10-CM | POA: Diagnosis not present

## 2021-11-22 DIAGNOSIS — Z3009 Encounter for other general counseling and advice on contraception: Secondary | ICD-10-CM

## 2021-11-22 MED ORDER — DOCUSATE SODIUM 100 MG PO CAPS: 100.0000 mg | ORAL_CAPSULE | Freq: Two times a day (BID) | ORAL | 5 refills | Status: DC

## 2021-11-22 MED ORDER — VALACYCLOVIR HCL 1 G PO TABS
1000.0000 mg | ORAL_TABLET | Freq: Two times a day (BID) | ORAL | 5 refills | Status: DC
Start: 2021-11-22 — End: 2022-05-22

## 2021-11-22 NOTE — Progress Notes (Signed)
? ? ?Post Partum Visit Note ? ?Melissa [name] is a 21 y.o. G16P2012 female who presents for a postpartum visit. She is 10 weeks postpartum following a primary cesarean section.  I have fully reviewed the prenatal and intrapartum course. The delivery was at 35.6 gestational weeks.  Anesthesia: spinal. Postpartum course has been Behavioral health concerns. Baby is doing well. Baby is feeding by bottle - Melissa [name] . Bleeding no bleeding. Bowel function is abnormal: constipation . Bladder function is normal. Patient is sexually active. Contraception method is none.  ?Postpartum depression screening: positive ? ? ?The pregnancy intention screening data noted above was reviewed. Potential methods of contraception were discussed. The patient elected to proceed with No data recorded. ? ? Edinburgh Postnatal Depression Scale - 11/22/21 1432   ? ?  ? Edinburgh Postnatal Depression Scale:  In the Past 7 Days  ? I have been able to laugh and see the funny side of things. 0   ? I have looked forward with enjoyment to things. 0   ? I have blamed myself unnecessarily when things went wrong. 0   ? I have been anxious or worried for no good reason. 3   ? I have felt scared or panicky for no good reason. 3   ? Things have been getting on top of me. 1   ? I have been so unhappy that I have had difficulty sleeping. 0   ? I have felt sad or miserable. 0   ? I have been so unhappy that I have been crying. 0   ? The thought of harming myself has occurred to me. 0   ? Edinburgh Postnatal Depression Scale Total 7   ? ?  ?  ? ?  ? ? ?Health Maintenance Due  ?Topic Date Due  ? COVID-19 Vaccine (1) Never done  ? HPV VACCINES (3 - 3-dose series) 08/05/2018  ? ? ?The following portions of the patient's history were reviewed and updated as appropriate: allergies, current medications, past family history, past medical history, past social history, past surgical history, and problem list. ? ?Review of Systems ?A comprehensive review of  systems was negative.except for depression ? ?Objective:  ?BP 128/83   Pulse (!) 106   Wt 207 lb (93.9 kg)   LMP 11/07/2021   Breastfeeding No   BMI 33.41 kg/m?   ? ?General:  alert and no distress  ? Breasts:  normal  ?Lungs: clear to auscultation bilaterally  ?Heart:  regular rate and rhythm, S1, S2 normal, no murmur, click, rub or gallop  ?Abdomen: soft, non-tender; bowel sounds normal; no masses,  no organomegaly   ?Wound well approximated incision  ?GU exam:  not indicated  ?     ?Assessment:  ? ? 1. Postpartum state ?- clinically stable ? ?2. Encounter for other general counseling and advice on contraception ?- wants an IUD ? ?3. HSV infection ?Rx: ?- valACYclovir (VALTREX) 1000 MG tablet; Take 1 tablet (1,000 mg total) by mouth 2 (two) times daily.  Dispense: 30 tablet; Refill: 5 ? ?4. Constipation by delayed colonic transit ?Rx: ?- docusate sodium (COLACE) 100 MG capsule; Take 1 capsule (100 mg total) by mouth 2 (two) times daily.  Dispense: 60 capsule; Refill: 5 ? ?5. MDD (major depressive disorder), recurrent severe, without psychosis (Melissa [name]) ?- followed by psychiatry ?  ? ?Plan:  ? ?Essential components of care per ACOG recommendations: ? ?1.  Mood and well being: Patient with positive depression screening today. Reviewed local  resources for support.  ?- Patient tobacco use? Yes. Patient desires to quit? Yes.Discussed reduction and cessation  ?- hx of drug use? Yes. Discussed support systems and outpatient/inpatient treatment options.   ? ?2. Infant care and feeding:  ?-Patient currently breastmilk feeding? No.  ?-Social determinants of health (SDOH) reviewed in EPIC. No concernsdepressionThe following needs were identified:  inpatient treatment ? ?3. Sexuality, contraception and birth spacing ?- Patient does not want a pregnancy in the next year.  Desired family size is unknown children.  ?- Reviewed reproductive life planning. Reviewed contraceptive methods based on pt preferences and effectiveness.   Patient desired IUD or IUS today.   ?- Discussed birth spacing of 18 months ? ?4. Sleep and fatigue ?-Encouraged family/partner/community support of 4 hrs of uninterrupted sleep to help with mood and fatigue ? ?5. Physical Recovery  ?- Discussed patients delivery and complications. She describes her labor as mixed ?- Patient had a C-section, no problems at delivery.  ?- Patient has urinary incontinence? No. ?- Patient is safe to resume physical and sexual activity ? ?6.  Health Maintenance ?- HM due items addressed Yes ?- Last pap smear  ?Diagnosis  ?Date Value Ref Range Status  ?11/19/2020   Final  ? - Negative for intraepithelial lesion or malignancy (NILM)  ? Pap smear not done at today's visit.  ?-Breast Cancer screening indicated? No.  ? ?7. Chronic Disease/Pregnancy Condition follow up:  Depression ? ?- PCP follow up ? ?Baltazar Najjar, MD ?[facility] [place], [facility] ?11/22/21  ? ?

## 2021-11-24 ENCOUNTER — Ambulatory Visit: Payer: Medicaid Other | Admitting: Family Medicine

## 2021-12-13 ENCOUNTER — Ambulatory Visit: Payer: Medicaid Other | Admitting: Obstetrics

## 2021-12-15 ENCOUNTER — Ambulatory Visit (INDEPENDENT_AMBULATORY_CARE_PROVIDER_SITE_OTHER): Payer: Medicaid Other | Admitting: Physician Assistant

## 2021-12-15 DIAGNOSIS — F411 Generalized anxiety disorder: Secondary | ICD-10-CM

## 2021-12-15 DIAGNOSIS — F53 Postpartum depression: Secondary | ICD-10-CM | POA: Diagnosis not present

## 2021-12-15 MED ORDER — VENLAFAXINE HCL ER 75 MG PO CP24: 75.0000 mg | ORAL_CAPSULE | Freq: Every day | ORAL | 1 refills | Status: DC

## 2021-12-15 MED ORDER — HYDROXYZINE HCL 25 MG PO TABS: 25.0000 mg | ORAL_TABLET | Freq: Three times a day (TID) | ORAL | 1 refills | Status: DC | PRN

## 2021-12-15 MED ORDER — ARIPIPRAZOLE 2 MG PO TABS: 2.0000 mg | ORAL_TABLET | Freq: Every day | ORAL | 1 refills | Status: DC
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
## 2021-12-18 ENCOUNTER — Encounter (HOSPITAL_COMMUNITY): Payer: Self-pay | Admitting: Physician Assistant

## 2021-12-19 ENCOUNTER — Telehealth (HOSPITAL_COMMUNITY): Payer: Self-pay | Admitting: *Deleted

## 2021-12-19 NOTE — Telephone Encounter (Signed)
[place] TRACKS APPROVED ? ?ARIPiprazole (ABILIFY) 2 MG tablet ?Take 1 tablet (2 mg total) by mouth daily  ? ?P.A # [id number] ? ?EFFECTIVE: 12/19/21  TO  12/14/22 ? ?Rx INFORMED ?

## 2021-12-22 ENCOUNTER — Ambulatory Visit (HOSPITAL_COMMUNITY)
Admission: EM | Admit: 2021-12-22 | Discharge: 2021-12-22 | Disposition: A | Discharge status: Home / Self Care | Payer: Medicaid Other | Attending: Internal Medicine | Admitting: Internal Medicine

## 2021-12-22 ENCOUNTER — Encounter (HOSPITAL_COMMUNITY): Payer: Self-pay

## 2021-12-22 DIAGNOSIS — N898 Other specified noninflammatory disorders of vagina: Secondary | ICD-10-CM | POA: Diagnosis not present

## 2021-12-22 DIAGNOSIS — Z202 Contact with and (suspected) exposure to infections with a predominantly sexual mode of transmission: Secondary | ICD-10-CM | POA: Insufficient documentation

## 2021-12-22 LAB — POCT URINALYSIS DIPSTICK, ED / UC
Bilirubin Urine: NEGATIVE
Glucose, UA: NEGATIVE mg/dL
Hgb urine dipstick: NEGATIVE
Ketones, ur: NEGATIVE mg/dL
Nitrite: NEGATIVE
Protein, ur: NEGATIVE mg/dL
Specific Gravity, Urine: 1.02 (ref 1.005–1.030)
Urobilinogen, UA: 1 mg/dL (ref 0.0–1.0)
pH: 7.5 (ref 5.0–8.0)

## 2021-12-22 LAB — POC URINE PREG, ED: Preg Test, Ur: NEGATIVE

## 2021-12-22 LAB — HIV ANTIBODY (ROUTINE TESTING W REFLEX): HIV Screen 4th Generation wRfx: NONREACTIVE

## 2021-12-22 LAB — CBC
HCT: 35.7 % — ABNORMAL LOW (ref 36.0–46.0)
Hemoglobin: 12.3 g/dL (ref 12.0–15.0)
MCH: 27 pg (ref 26.0–34.0)
MCHC: 34.5 g/dL (ref 30.0–36.0)
MCV: 78.3 fL — ABNORMAL LOW (ref 80.0–100.0)
Platelets: 251 10*3/uL (ref 150–400)
RBC: 4.56 MIL/uL (ref 3.87–5.11)
RDW: 14 % (ref 11.5–15.5)
WBC: 7.1 10*3/uL (ref 4.0–10.5)
nRBC: 0 % (ref 0.0–0.2)

## 2021-12-22 NOTE — Discharge Instructions (Addendum)
You were seen today for STD testing and for urinary symptoms.  Your STI testing results will come back in the next 2 to 3 days.  You will likely receive these results via MyChart before we are able to give you a phone call with an update.  If any of your results come back positive, you will receive a phone call from [place] and we will prescribe the appropriate treatment at that time.  If your results are negative, you will not hear from [place]. ? ?Your urine was negative for infection today.  Please avoid urinary irritants such as caffeine, juice, and soda.  I have sent your urine for a urine culture just to make sure that it does not grow any bacteria in the lab.  If it does grow bacteria, you will receive a phone call and be prescribed an antibiotic. ? ?We drew labs today to make sure that you are not anemic.  You will receive these results as well in the next 2 to 3 days via MyChart. ? ?If you develop any new or worsening symptoms or do not improve in the next 2 to 3 days, please return.  If your symptoms are severe, please go to the emergency room.  Follow-up with your primary care provider for further evaluation and management of your symptoms as well as ongoing wellness visits.  I hope you feel better! ?
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
## 2021-12-22 NOTE — ED Triage Notes (Signed)
Pt states she wants to be tested for HIV. 

## 2021-12-23 ENCOUNTER — Telehealth (HOSPITAL_COMMUNITY): Payer: Self-pay | Admitting: Emergency Medicine

## 2021-12-23 LAB — CERVICOVAGINAL ANCILLARY ONLY
Bacterial Vaginitis (gardnerella): POSITIVE — AB
Candida Glabrata: NEGATIVE
Candida Vaginitis: NEGATIVE
Chlamydia: NEGATIVE
Comment: NEGATIVE
Comment: NEGATIVE
Comment: NEGATIVE
Comment: NEGATIVE
Comment: NEGATIVE
Comment: NORMAL
Neisseria Gonorrhea: NEGATIVE
Trichomonas: NEGATIVE

## 2021-12-23 LAB — RPR: RPR Ser Ql: NONREACTIVE

## 2021-12-23 MED ORDER — METRONIDAZOLE 500 MG PO TABS: 500.0000 mg | ORAL_TABLET | Freq: Two times a day (BID) | ORAL | 0 refills | Status: DC

## 2022-01-03 ENCOUNTER — Encounter: Payer: Self-pay | Admitting: Obstetrics

## 2022-01-03 ENCOUNTER — Ambulatory Visit (INDEPENDENT_AMBULATORY_CARE_PROVIDER_SITE_OTHER): Payer: Medicaid Other | Admitting: Obstetrics

## 2022-01-03 VITALS — BP 117/79 | HR 97 | Ht 67.0 in | Wt 207.2 lb

## 2022-01-03 DIAGNOSIS — N912 Amenorrhea, unspecified: Secondary | ICD-10-CM | POA: Diagnosis not present

## 2022-01-03 DIAGNOSIS — Z3201 Encounter for pregnancy test, result positive: Secondary | ICD-10-CM

## 2022-01-03 LAB — POCT URINE PREGNANCY: Preg Test, Ur: POSITIVE — AB

## 2022-01-03 NOTE — Progress Notes (Signed)
Patient here for ParaGuard IUD. ?UPT:  POSITIVE.   ?Patient left without being seen. ? ?Brock Bad, MD ?01/03/2022 4:52 PM  ?

## 2022-01-03 NOTE — Progress Notes (Signed)
Patient presents for paragard insertion. Patient states that she is two days late for her cycle.  ? ?UPT + today ? ?Patient left without being seen by provider. ?

## 2022-01-25 ENCOUNTER — Telehealth (HOSPITAL_COMMUNITY): Payer: Medicaid Other | Admitting: Physician Assistant

## 2022-01-25 ENCOUNTER — Encounter (HOSPITAL_COMMUNITY): Payer: Self-pay

## 2022-02-01 ENCOUNTER — Inpatient Hospital Stay (HOSPITAL_COMMUNITY)
Admission: AD | Admit: 2022-02-01 | Discharge: 2022-02-01 | Disposition: A | Discharge status: Home / Self Care | Payer: Medicaid Other | Attending: Obstetrics and Gynecology | Admitting: Obstetrics and Gynecology

## 2022-02-01 ENCOUNTER — Inpatient Hospital Stay (HOSPITAL_COMMUNITY): Payer: Medicaid Other

## 2022-02-01 ENCOUNTER — Other Ambulatory Visit: Payer: Self-pay

## 2022-02-01 ENCOUNTER — Encounter: Payer: Self-pay | Admitting: Advanced Practice Midwife

## 2022-02-01 DIAGNOSIS — Z5321 Procedure and treatment not carried out due to patient leaving prior to being seen by health care provider: Secondary | ICD-10-CM | POA: Insufficient documentation

## 2022-02-01 DIAGNOSIS — O209 Hemorrhage in early pregnancy, unspecified: Secondary | ICD-10-CM | POA: Insufficient documentation

## 2022-02-01 DIAGNOSIS — A5901 Trichomonal vulvovaginitis: Secondary | ICD-10-CM | POA: Insufficient documentation

## 2022-02-01 LAB — CBC
HCT: 36.6 % (ref 36.0–46.0)
Hemoglobin: 12.9 g/dL (ref 12.0–15.0)
MCH: 27.2 pg (ref 26.0–34.0)
MCHC: 35.2 g/dL (ref 30.0–36.0)
MCV: 77.2 fL — ABNORMAL LOW (ref 80.0–100.0)
Platelets: 240 10*3/uL (ref 150–400)
RBC: 4.74 MIL/uL (ref 3.87–5.11)
RDW: 15.2 % (ref 11.5–15.5)
WBC: 6.7 10*3/uL (ref 4.0–10.5)
nRBC: 0 % (ref 0.0–0.2)

## 2022-02-01 LAB — HCG, QUANTITATIVE, PREGNANCY: hCG, Beta Chain, Quant, S: 121831 m[IU]/mL — ABNORMAL HIGH (ref <5)

## 2022-02-01 LAB — ABO/RH: ABO/RH(D): O NEG

## 2022-02-01 MED ORDER — RHO D IMMUNE GLOBULIN 1500 UNIT/2ML IJ SOSY
300.0000 ug | PREFILLED_SYRINGE | Freq: Once | INTRAMUSCULAR | Status: DC
  Filled 2022-02-01: qty 2

## 2022-02-01 NOTE — MAU Note (Signed)
Charge RN called in lobby 3 times for patient. MAU Patient Access also had not seen her.

## 2022-02-02 LAB — RH IG WORKUP (INCLUDES ABO/RH)
Antibody Screen: NEGATIVE
Gestational Age(Wks): 8
Unit division: 0

## 2022-02-08 ENCOUNTER — Ambulatory Visit (INDEPENDENT_AMBULATORY_CARE_PROVIDER_SITE_OTHER): Payer: Medicaid Other

## 2022-02-08 VITALS — BP 118/75 | HR 88 | Ht 67.0 in | Wt 210.2 lb

## 2022-02-08 DIAGNOSIS — Z3480 Encounter for supervision of other normal pregnancy, unspecified trimester: Secondary | ICD-10-CM | POA: Diagnosis not present

## 2022-02-08 DIAGNOSIS — O26899 Other specified pregnancy related conditions, unspecified trimester: Secondary | ICD-10-CM | POA: Diagnosis not present

## 2022-02-08 DIAGNOSIS — O26851 Spotting complicating pregnancy, first trimester: Secondary | ICD-10-CM

## 2022-02-08 DIAGNOSIS — Z6791 Unspecified blood type, Rh negative: Secondary | ICD-10-CM | POA: Diagnosis not present

## 2022-02-08 DIAGNOSIS — O4690 Antepartum hemorrhage, unspecified, unspecified trimester: Secondary | ICD-10-CM

## 2022-02-08 DIAGNOSIS — Z3A10 10 weeks gestation of pregnancy: Secondary | ICD-10-CM

## 2022-02-08 DIAGNOSIS — O0990 Supervision of high risk pregnancy, unspecified, unspecified trimester: Secondary | ICD-10-CM | POA: Insufficient documentation

## 2022-02-08 MED ORDER — RHO D IMMUNE GLOBULIN 1500 UNIT/2ML IJ SOSY
300.0000 ug | PREFILLED_SYRINGE | Freq: Once | INTRAMUSCULAR | Status: AC
  Administered 2022-02-08: 300 ug via INTRAMUSCULAR

## 2022-02-08 MED ORDER — GOJJI WEIGHT SCALE MISC: 1.0000 | 0 refills | Status: DC

## 2022-02-08 MED ORDER — BLOOD PRESSURE KIT DEVI: 1.0000 | 0 refills | Status: DC

## 2022-02-08 NOTE — Progress Notes (Signed)
Agree with nurses's documentation of this patient's clinic encounter.  Analiya Porco L, MD  

## 2022-02-08 NOTE — Progress Notes (Signed)
Agree with nurses's documentation of this patient's clinic encounter.  Caelan Branden L, MD  

## 2022-02-08 NOTE — Progress Notes (Cosign Needed)
Patient presents for New OB Intake/ Dating viability u/s. Patient went to MAU on 6/14 but left without being seen due to having to go pick up her children. Patient states that she bled for two days and then it stopped. Also reports cramping at about 7/10 and describes as period cramping. Patient has not taken anything for the cramping. States last intercourse was about a week before her MAU visit. Rhogam given today for Rh negative status. Patient also has increased PHQ9 and GAD 7 score, but declines behavioral health services at this time due to already being established with another center. No other concerns.  New OB Intake  I connected with  Melissa [name] on 02/08/22 at  1:10 PM EDT by in person and verified that I am speaking with the correct person using two identifiers. Nurse is located at [facility] and pt is located at [place].  I discussed the limitations, risks, security and privacy concerns of performing an evaluation and management service by telephone and the availability of in person appointments. I also discussed with the patient that there may be a patient responsible charge related to this service. The patient expressed understanding and agreed to proceed.  I explained I am completing New OB Intake today. We discussed her EDD of 09/09/22 that is based on LMP of 12/03/21. Pt is G4/P2012. I reviewed her allergies, medications, Medical/Surgical/OB history, and appropriate screenings. I informed her of [facility] services. Based on history, this is a/an  pregnancy  .   Patient Active Problem List   Diagnosis Date Noted   Antepartum bleeding, first trimester 02/01/2022   Post-partum depression 11/17/2021   Post partum depression 11/16/2021   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   History of primary cesarean section 08/31/2021   Status post primary low transverse cesarean section 08/31/2021   [redacted] weeks gestation of pregnancy 08/18/2021   HSV infection 08/11/2021   Supervision of low-risk  pregnancy 02/09/2021   Abnormal involuntary movements 08/04/2020   Migraine with aura and without status migrainosus, not intractable 08/12/2019   Seizures ([name])    Sickle cell trait ([name]) 02/13/2019   Rh negative, antepartum 02/05/2019   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   MyChart/Babyscripts MyChart access verified. I explained pt will have some visits in office and some virtually. Babyscripts instructions given and order placed. Patient verifies receipt of registration text/e-mail. Account successfully created and app downloaded.  Blood Pressure Cuff  Blood pressure cuff ordered for patient to pick-up from [employer]. Explained after first prenatal appt pt will check weekly and document in Babyscripts.  Weight scale: Patient does not  have weight scale. Weight scale ordered for patient to pick up from [employer].   Anatomy US Explained first scheduled [place] will be around 19 weeks. Dating and viability scan performed today.  Labs Discussed Avelina Laine genetic screening with patient. Would like both Panorama and Horizon drawn at new OB visit.Also if interested in genetic testing, tell patient she will need AFP 15-21 weeks to complete genetic testing .Routine prenatal labs needed.  Covid Vaccine Patient has not covid vaccine.   Is patient a Sports administrator  candidate?  Not a Candidate Declined due to Other   Not a candidate due to Other  Centering Patient" indicated on sticky note   Is patient interested in [place]?  No  "Interested  in [employer] - Schedule next visit with CNM" on sticky note  Informed patient of [facility] website  and placed link in her AVS.   Social Determinants of Health Food Insecurity: Patient denies food insecurity. WIC Referral: Patient is interested in referral to [facility]  Transportation: Patient denies transportation needs. Childcare: Discussed no children allowed at ultrasound appointments. Offered childcare services; patient declines childcare services at this time.  Send link to Pregnancy Navigators   Placed OB Box on problem list and updated  First visit review I reviewed new OB appt with pt. I explained she will have a pelvic exam, ob bloodwork with genetic screening, and PAP smear. Explained pt will be seen by Nettie Elm at first visit; encounter routed to appropriate provider. Explained that patient will be seen by pregnancy navigator following visit with provider. [facility] information placed in AVS.   Hamilton Capri, RN 02/08/2022  1:44 PM

## 2022-02-15 ENCOUNTER — Encounter: Payer: Medicaid Other | Admitting: Obstetrics and Gynecology

## 2022-02-15 ENCOUNTER — Ambulatory Visit (HOSPITAL_COMMUNITY): Payer: Medicaid Other | Admitting: Licensed Clinical Social Worker

## 2022-03-15 IMAGING — US US MFM OB DETAIL+14 WK
1 series · 13 of 28 positions shown · non-contrast
Comparison: none

[Series 1: us mfm ob detail+14 wk · 185 acquisitions, 13 frames shown]
[im 7/185]
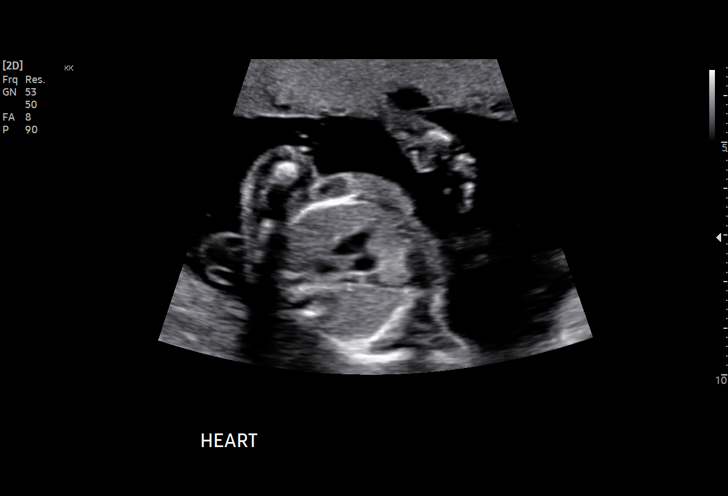
[im 21/185]
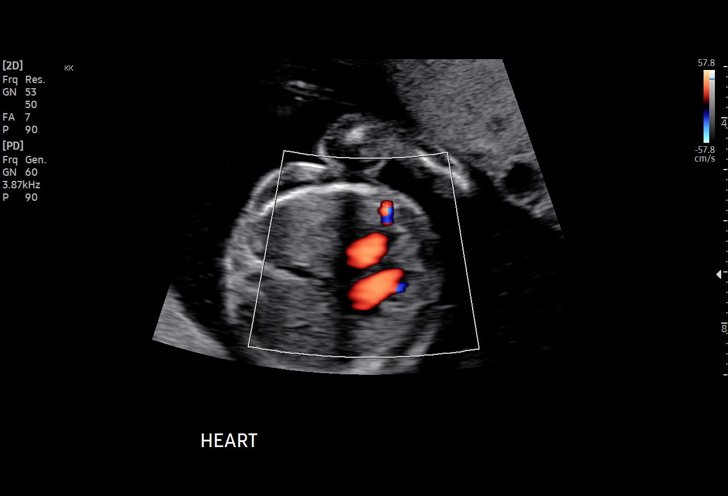
[im 35/185]
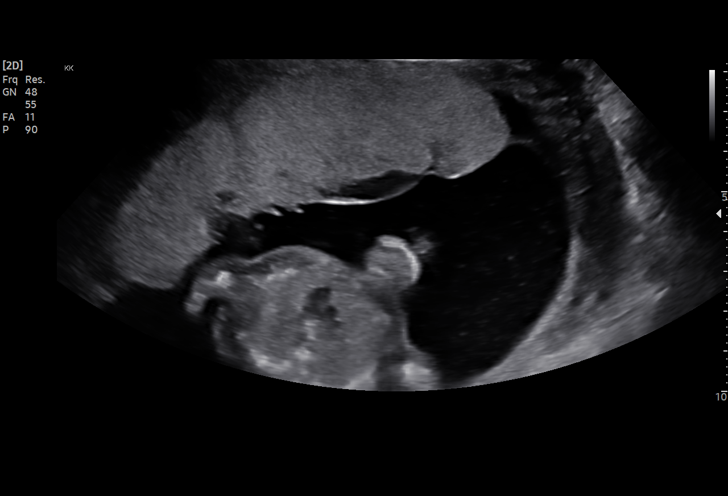
[im 48/185]
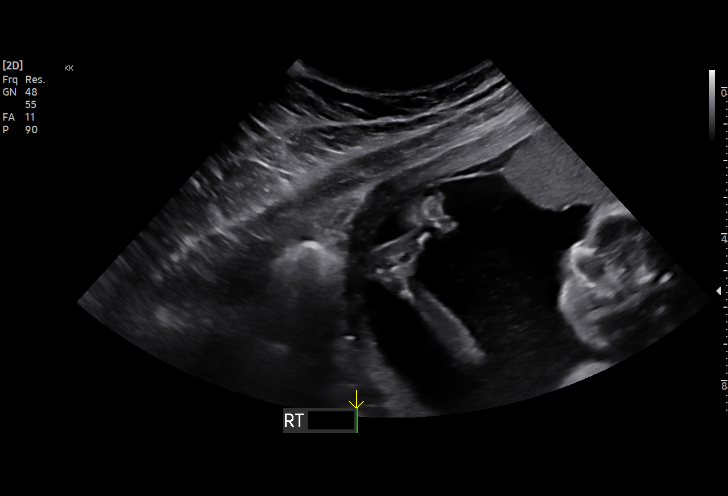
[im 62/185]
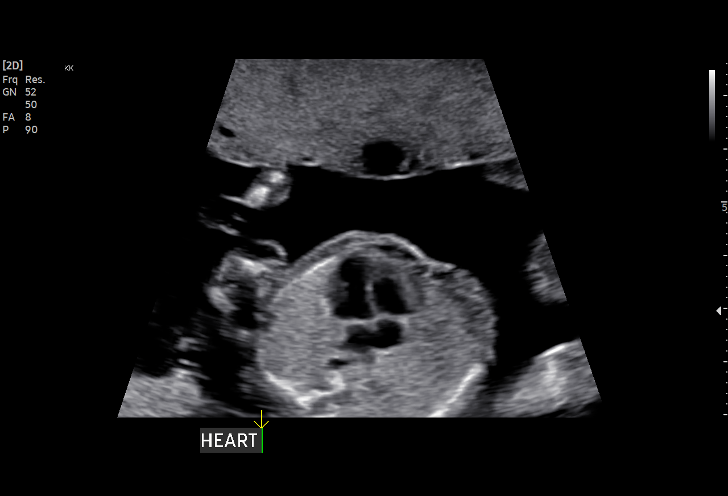
[im 75/185]
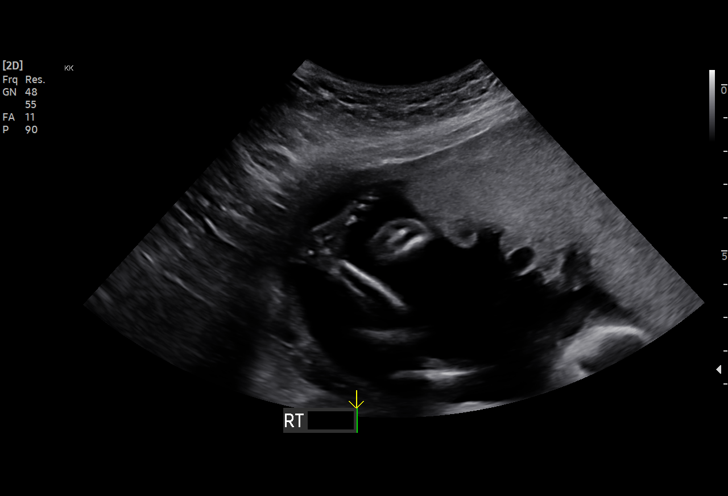
[im 96/185]
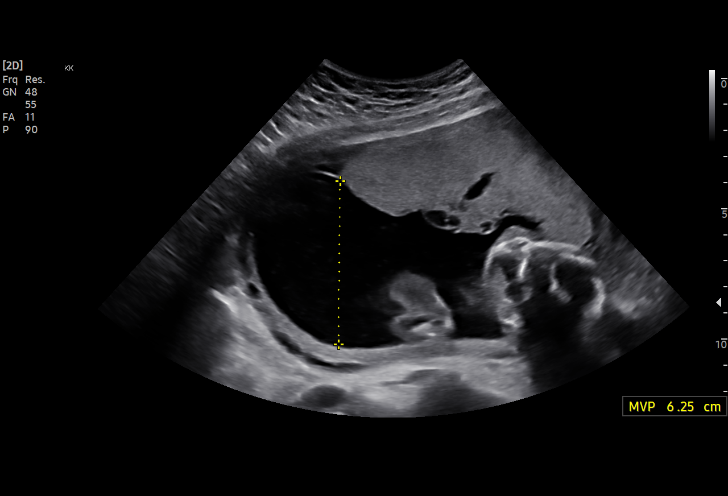
[im 110/185]
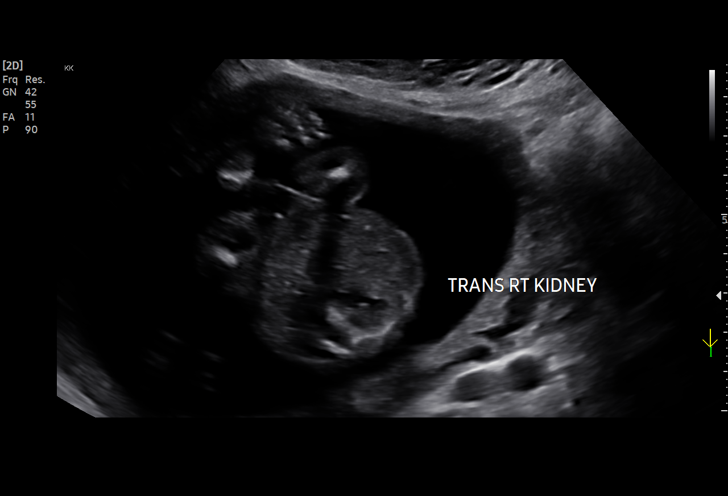
[im 123/185]
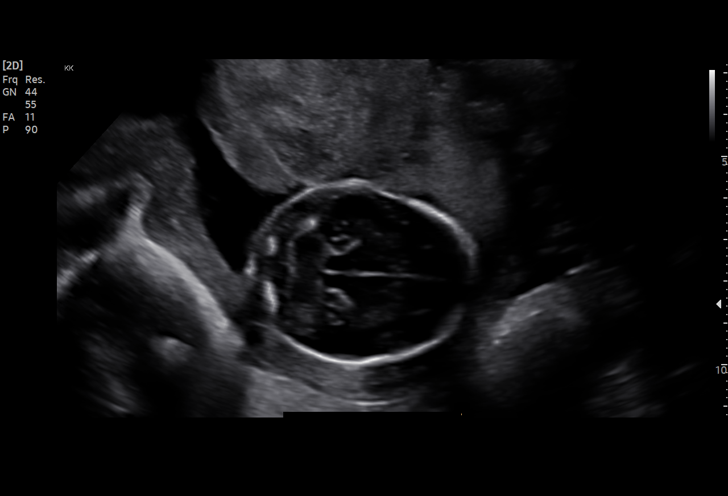
[im 137/185]
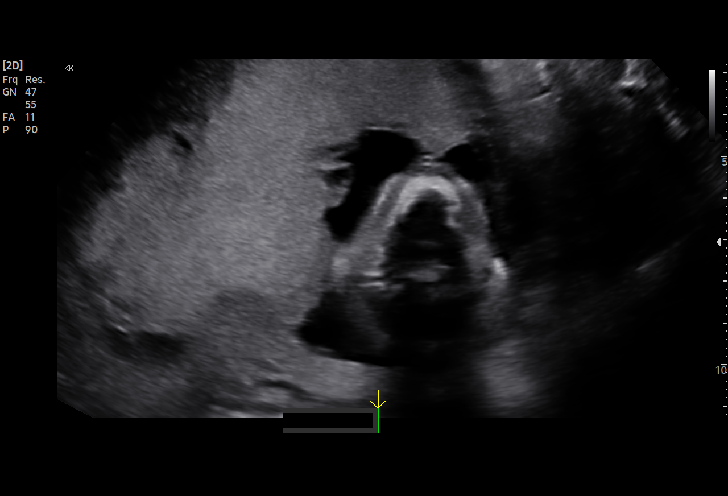
[im 150/185]
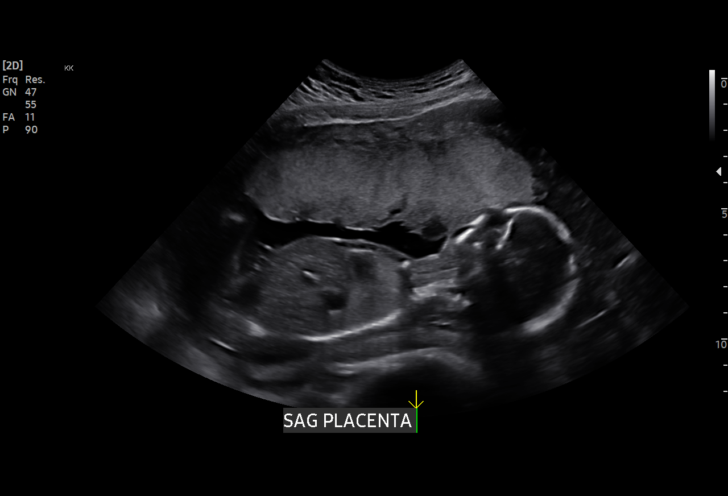
[im 164/185]
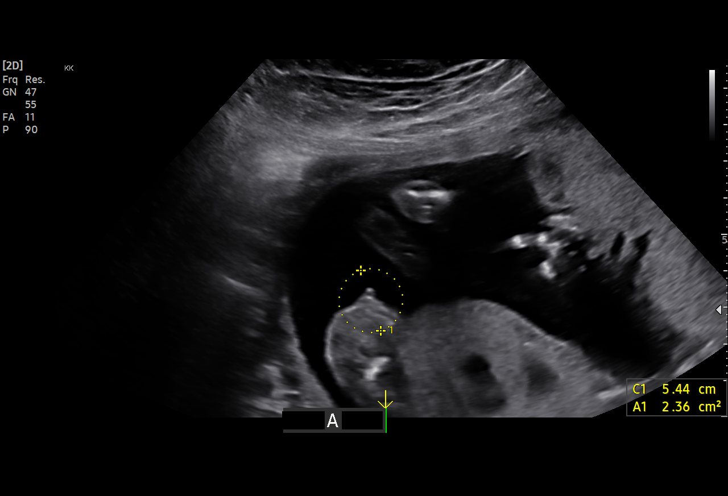
[im 178/185]
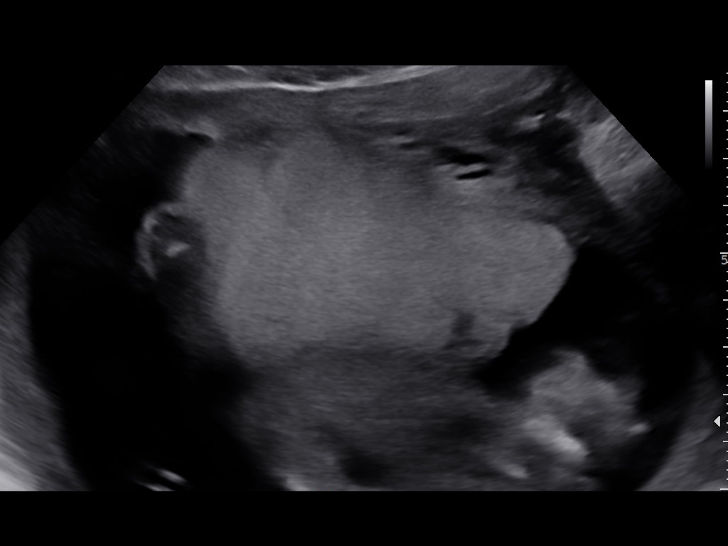

[13 of 28 positions shown; findings below may reference images not displayed]

[REDACTED]
                                                            [REDACTED]
                   [id number]

Indications

 19 weeks gestation of pregnancy
 Encounter for antenatal screening for
 malformations
 Obesity complicating pregnancy, second
 trimester (BMI 32)
 History of sickle cell trait
 Epilepsy complicating pregnancy, childbirth,
 or the puerperium, antepartum
 Seizure disorder
 Low Risk NIPS(Negative AFP)
 Pyelectasis of fetus on prenatal ultrasound
Vital Signs

 BMI:
Fetal Evaluation

 Num Of Fetuses:         1
 Fetal Heart Rate(bpm):  145
 Cardiac Activity:       Observed
 Presentation:           Cephalic
 Placenta:               Anterior
 P. Cord Insertion:      Visualized

 Amniotic Fluid
 AFI FV:      Within normal limits

                             Largest Pocket(cm)

Biometry

 BPD:      42.7  mm     G. Age:  18w 6d         47  %    CI:         73.3   %    70 - 86
                                                         FL/HC:      20.1   %    16.1 -
 HC:      158.5  mm     G. Age:  18w 5d         29  %    HC/AC:      1.05        1.09 -
 AC:      151.6  mm     G. Age:  20w 3d         86  %    FL/BPD:     74.7   %
 FL:       31.9  mm     G. Age:  19w 6d         76  %    FL/AC:      21.0   %    20 - 24
 HUM:      29.2  mm     G. Age:  19w 4d         64  %
 CER:      18.1  mm     G. Age:  18w 0d        3.7  %
 NFT:       4.0  mm
 LV:        5.5  mm
 CM:        3.6  mm

 Est. FW:     325  gm    0 lb 11 oz      93  %
OB History

 Gravidity:    3         Term:   1        Prem:   0        SAB:   1
 TOP:          0       Ectopic:  0        Living: 1
Gestational Age

 LMP:           19w 0d        Date:  12/01/20                 EDD:   09/07/21
 U/S Today:     19w 3d                                        EDD:   09/04/21
 Best:          19w 0d     Det. By:  LMP  (12/01/20)          EDD:   09/07/21
Anatomy

 Cranium:               Appears normal         LVOT:                   Appears normal
 Cavum:                 Appears normal         Aortic Arch:            Appears normal
 Ventricles:            Appears normal         Ductal Arch:            Appears normal
 Choroid Plexus:        Appears normal         Diaphragm:              Appears normal
 Cerebellum:            Appears normal         Stomach:                Appears normal, left
                                                                       sided
 Posterior Fossa:       Appears normal         Abdomen:                Appears normal
 Nuchal Fold:           Appears normal         Abdominal Wall:         Appears nml (cord
                                                                       insert, abd wall)
 Face:                  Appears normal         Cord Vessels:           Appears normal (3
                        (orbits and profile)                           vessel cord)
 Lips:                  Appears normal         Kidneys:                Right UTD
 Palate:                Appears normal         Bladder:                Appears normal
 Thoracic:              Appears normal         Spine:                  Not well visualized
 Heart:                 Appears normal         Upper Extremities:      Appears normal
                        (4CH, axis, and
                        situs)
 RVOT:                  Appears normal         Lower Extremities:      Appears normal

 Other:  Fetus appears to be female. Heels visualized. Hands visualized.
         Technically difficult due to fetal position.
Cervix Uterus Adnexa

 Cervix
 Length:            3.8  cm.
 Normal appearance by transabdominal scan.

 Adnexa
 No abnormality visualized.
Impression

 Single intrauterine pregnancy here for a detailed anatomy
 Normal anatomy with measurements consistent with dates
 There is good fetal movement and amniotic fluid volume
 Suboptimal views of the fetal anatomy were obtained
 secondary to fetal position.

 Today we observed a right kidney that had renal caliectasis.
 The renal pelvis measured at 3.7 mm suboptimal views
 measured the kidney at 5.0 cm.
Recommendations

 Follow up growth and anatomy in 4-6 weeks.

## 2022-03-18 ENCOUNTER — Inpatient Hospital Stay (HOSPITAL_COMMUNITY)
Admission: AD | Admit: 2022-03-18 | Discharge: 2022-03-18 | Disposition: A | Discharge status: Home / Self Care | Payer: Medicaid Other | Attending: Obstetrics and Gynecology | Admitting: Obstetrics and Gynecology

## 2022-03-18 ENCOUNTER — Encounter (HOSPITAL_COMMUNITY): Payer: Self-pay | Admitting: Obstetrics and Gynecology

## 2022-03-18 DIAGNOSIS — R109 Unspecified abdominal pain: Secondary | ICD-10-CM

## 2022-03-18 DIAGNOSIS — O99332 Smoking (tobacco) complicating pregnancy, second trimester: Secondary | ICD-10-CM | POA: Insufficient documentation

## 2022-03-18 DIAGNOSIS — N760 Acute vaginitis: Secondary | ICD-10-CM | POA: Insufficient documentation

## 2022-03-18 DIAGNOSIS — Z3A15 15 weeks gestation of pregnancy: Secondary | ICD-10-CM | POA: Insufficient documentation

## 2022-03-18 DIAGNOSIS — O23592 Infection of other part of genital tract in pregnancy, second trimester: Secondary | ICD-10-CM | POA: Insufficient documentation

## 2022-03-18 DIAGNOSIS — O99012 Anemia complicating pregnancy, second trimester: Secondary | ICD-10-CM | POA: Insufficient documentation

## 2022-03-18 DIAGNOSIS — B9689 Other specified bacterial agents as the cause of diseases classified elsewhere: Secondary | ICD-10-CM | POA: Insufficient documentation

## 2022-03-18 DIAGNOSIS — O99342 Other mental disorders complicating pregnancy, second trimester: Secondary | ICD-10-CM | POA: Insufficient documentation

## 2022-03-18 DIAGNOSIS — O99512 Diseases of the respiratory system complicating pregnancy, second trimester: Secondary | ICD-10-CM | POA: Insufficient documentation

## 2022-03-18 DIAGNOSIS — O26892 Other specified pregnancy related conditions, second trimester: Secondary | ICD-10-CM

## 2022-03-18 LAB — WET PREP, GENITAL
Sperm: NONE SEEN
Trich, Wet Prep: NONE SEEN
WBC, Wet Prep HPF POC: <10 (ref <10)
Yeast Wet Prep HPF POC: NONE SEEN

## 2022-03-18 LAB — URINALYSIS, ROUTINE W REFLEX MICROSCOPIC
Bilirubin Urine: NEGATIVE
Glucose, UA: NEGATIVE mg/dL
Hgb urine dipstick: NEGATIVE
Ketones, ur: NEGATIVE mg/dL
Nitrite: NEGATIVE
Protein, ur: NEGATIVE mg/dL
Specific Gravity, Urine: 1.01 (ref 1.005–1.030)
pH: 7 (ref 5.0–8.0)

## 2022-03-18 MED ORDER — METRONIDAZOLE 0.75 % VA GEL: 1.0000 | Freq: Every day | VAGINAL | 0 refills | Status: DC
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
## 2022-03-18 NOTE — MAU Provider Note (Signed)
History     CSN: [id number]  Arrival date and time: 03/18/22 2131   Event Date/Time   First Provider Initiated Contact with Patient 03/18/22 2303      Chief Complaint  Patient presents with   Abdominal Pain   Melissa [name] is a 21 y.o. [id number] at 62w0dwho receives care at [facility].  She presents today for Abdominal Pain.  She states the pain is located at "my c/s line" and sometimes above the belly button.  She denies current pain, but states it is "aching sharp" when it occurs.  She reports the pain is intermittent in nature and has no relieving or aggravating factors. She denies issues with urination, constipation, or diarrhea.  No recent sexual activity and no vaginal complaints including discharge, leaking, or bleeding.     OB History     Gravida  4   Para  2   Term  2   Preterm      AB  1   Living  2      SAB      IAB      Ectopic      Multiple  0   Live Births  2           Past Medical History:  Diagnosis Date   Anemia    Anxiety    Asthma    inhaler. last attack June 2019   Congenital hydronephrosis 2001   Constipation    Depression    Episodic tension-type headache, not intractable 04/16/2015   Family history of adverse reaction to anesthesia    mom rushed to hospital from Dentist office   Genital herpes    Low back strain, sequela 11/10/2020   Migraine without aura and without status migrainosus, not intractable 04/16/2015   PID (pelvic inflammatory disease)    Reactive airway disease in pediatric patient    Rh negative state in antepartum period 02/05/2019   [place]    last one in 2015 - on meds   [place]    [facility] [place]    Past Surgical History:  Procedure Laterality Date   APPENDECTOMY     CESAREAN SECTION N/A 08/31/2021   Procedure: CESAREAN SECTION;  Surgeon: WGwynne Edinger MD;  Location: [facility];  Service: Obstetrics;  Laterality: N/A;   HERNIA REPAIR      INTESTINAL MALROTATION REPAIR  2001    Family History  Problem Relation Age of Onset   Depression Mother    Stroke Mother    Obesity Mother    Post-traumatic stress disorder Mother    Anxiety disorder Mother    Hypertension Mother    Diabetes Mother    Schizophrenia Father    Kidney disease Father     Social History   Tobacco Use   Smoking status: Every Day    Types: Cigarettes   Smokeless tobacco: Never   Tobacco comments:    black and milds, 2 cigs daily  Vaping Use   Vaping Use: Never used  Substance Use Topics   Alcohol use: Not Currently    Comment: occasionally, prior to pregnancy   Drug use: Not Currently    Types: Marijuana    Comment: last 2020    Allergies:  Allergies  Allergen Reactions   Benadryl [Diphenhydramine Hcl] Other (See Comments)    Causes seizures    Gluten Meal Nausea And Vomiting   Shellfish Allergy Anaphylaxis    Patient states her  throat gets tight   Zithromax [Azithromycin] Anaphylaxis and Swelling   Lactose Intolerance (Gi) Diarrhea   Latex Swelling    Labial edema after usage of latex condoms.     Medications Prior to Admission  Medication Sig Dispense Refill Last Dose   albuterol (VENTOLIN HFA) 108 (90 Base) MCG/ACT inhaler Inhale 1-2 puffs into the lungs every 4 (four) hours as needed for wheezing or shortness of breath. 1 each 0 03/18/2022   hydrOXYzine (ATARAX) 25 MG tablet Take 1 tablet (25 mg total) by mouth 3 (three) times daily as needed for anxiety. 75 tablet 1 03/18/2022   acetaminophen (TYLENOL) 325 MG tablet Take 650 mg by mouth every 6 (six) hours as needed.      ARIPiprazole (ABILIFY) 2 MG tablet Take 1 tablet (2 mg total) by mouth daily. 30 tablet 1    Blood Pressure Monitoring (BLOOD PRESSURE KIT) DEVI 1 kit by Does not apply route once a week. 1 each 0    docusate sodium (COLACE) 100 MG capsule Take 1 capsule (100 mg total) by mouth 2 (two) times daily. 60 capsule 5    ferrous sulfate 325 (65 FE) MG tablet Take 1  tablet (325 mg total) by mouth at bedtime. 30 tablet 3    loratadine (CLARITIN) 10 MG tablet Take 1 tablet (10 mg total) by mouth daily. 30 tablet 0    metroNIDAZOLE (FLAGYL) 500 MG tablet Take 1 tablet (500 mg total) by mouth 2 (two) times daily. 14 tablet 0    Misc. Devices (GOJJI WEIGHT SCALE) MISC 1 Device by Does not apply route every 30 (thirty) days. 1 each 0    polyethylene glycol (MIRALAX / GLYCOLAX) 17 g packet Take 17 g by mouth daily as needed for moderate constipation or mild constipation.      Prenatal Vit-Fe Fumarate-FA (PREPLUS) 27-1 MG TABS Take 1 tablet by mouth daily. 30 tablet 13    valACYclovir (VALTREX) 1000 MG tablet Take 1 tablet (1,000 mg total) by mouth 2 (two) times daily. 30 tablet 5    venlafaxine XR (EFFEXOR-XR) 75 MG 24 hr capsule Take 1 capsule (75 mg total) by mouth daily with breakfast. 30 capsule 1     Review of Systems  Eyes:  Positive for visual disturbance (Spotty).  Gastrointestinal:  Positive for abdominal pain (None currently) and constipation. Negative for diarrhea, nausea and vomiting.  Genitourinary:  Negative for difficulty urinating, dysuria, vaginal bleeding and vaginal discharge.  Neurological:  Positive for headaches (Left side, none currently). Negative for dizziness and light-headedness.   Physical Exam   Blood pressure 114/68, pulse 97, temperature 98.2 F (36.8 C), temperature source Oral, resp. rate 17, height '5\' 6"'  (1.676 m), weight 100.4 kg, last menstrual period 12/03/2021, SpO2 100 %, not currently breastfeeding.  Physical Exam Vitals reviewed.  Constitutional:      Appearance: She is well-developed.  HENT:     Head: Normocephalic and atraumatic.  Eyes:     Conjunctiva/sclera: Conjunctivae normal.  Cardiovascular:     Rate and Rhythm: Normal rate.  Pulmonary:     Effort: Pulmonary effort is normal. No respiratory distress.  Abdominal:     General: Bowel sounds are normal.     Tenderness: There is no abdominal tenderness.   Musculoskeletal:        General: Normal range of motion.     Cervical back: Normal range of motion.  Skin:    General: Skin is warm and dry.  Neurological:     Mental Status:  She is alert and oriented to person, place, and time.  Psychiatric:        Mood and Affect: Mood normal.        Behavior: Behavior normal.     MAU Course  Procedures Results for orders placed or performed during the hospital encounter of 03/18/22 (from the past 24 hour(s))  Urinalysis, Routine w reflex microscopic Urine, Clean Catch     Status: Abnormal   Collection Time: 03/18/22 10:26 PM  Result Value Ref Range   Color, Urine YELLOW YELLOW   APPearance HAZY (A) CLEAR   Specific Gravity, Urine 1.010 1.005 - 1.030   pH 7.0 5.0 - 8.0   Glucose, UA NEGATIVE NEGATIVE mg/dL   Hgb urine dipstick NEGATIVE NEGATIVE   Bilirubin Urine NEGATIVE NEGATIVE   Ketones, ur NEGATIVE NEGATIVE mg/dL   Protein, ur NEGATIVE NEGATIVE mg/dL   Nitrite NEGATIVE NEGATIVE   Leukocytes,Ua MODERATE (A) NEGATIVE   RBC / HPF 0-5 0 - 5 RBC/hpf   WBC, UA 11-20 0 - 5 WBC/hpf   Bacteria, UA FEW (A) NONE SEEN   Squamous Epithelial / LPF 6-10 0 - 5  Wet prep, genital     Status: Abnormal   Collection Time: 03/18/22 10:26 PM  Result Value Ref Range   Yeast Wet Prep HPF POC NONE SEEN NONE SEEN   Trich, Wet Prep NONE SEEN NONE SEEN   Clue Cells Wet Prep HPF POC PRESENT (A) NONE SEEN   WBC, Wet Prep HPF POC <10 <10   Sperm NONE SEEN     MDM Exam Cultures: Wet Prep, GC/CT Labs: UA, UC Assessment and Plan  21 year old, [id number]  SIUP at 15.0 weeks Abdominal Pain-Resolved Bacterial Vaginosis  -Cultures collected by self-swab in triage. -Results as above. -Provider to bedside to discuss. -Discussed treatment and patient agreeable to vaginal gel. -Informed that this will not interfere with antidepressants. -Discussed usage of tylenol for intermittent abdominal pain. -Precautions reviewed. -Encouraged to call primary office or  return to MAU if symptoms worsen or with the onset of new symptoms. -Discharged to home in stable condition.  Maryann Conners 03/18/2022, 11:03 PM

## 2022-03-18 NOTE — MAU Note (Signed)
..  Melissa [name] is a 21 y.o. at [redacted]w[redacted]d here in MAU reporting: abdominal cramping that began two weeks but worse today. Has not taken anything for the pain.  Denies vaginal bleeding.   Pain score: 6/10 Vitals:   03/18/22 2153  BP: 114/68  Pulse: 97  Resp: 17  Temp: 98.2 F (36.8 C)  SpO2: 100%     ITG:PQDIYME 149 Lab orders placed from triage: UA

## 2022-03-20 ENCOUNTER — Other Ambulatory Visit (HOSPITAL_COMMUNITY): Payer: Self-pay | Admitting: Physician Assistant

## 2022-03-20 DIAGNOSIS — F411 Generalized anxiety disorder: Secondary | ICD-10-CM

## 2022-03-20 LAB — CULTURE, OB URINE: Culture: <10000 — AB

## 2022-03-21 ENCOUNTER — Ambulatory Visit: Payer: Medicaid Other | Admitting: Family Medicine

## 2022-03-23 LAB — GC/CHLAMYDIA PROBE AMP (~~LOC~~) NOT AT ARMC
Chlamydia: NEGATIVE
Comment: NEGATIVE
Comment: NORMAL
Neisseria Gonorrhea: NEGATIVE

## 2022-03-29 ENCOUNTER — Ambulatory Visit (HOSPITAL_COMMUNITY)
Admission: EM | Admit: 2022-03-29 | Discharge: 2022-03-29 | Disposition: A | Discharge status: Home / Self Care | Payer: Medicaid Other

## 2022-03-29 ENCOUNTER — Encounter (HOSPITAL_COMMUNITY): Payer: Self-pay | Admitting: Emergency Medicine

## 2022-03-29 ENCOUNTER — Encounter (HOSPITAL_COMMUNITY): Payer: Self-pay | Admitting: Obstetrics & Gynecology

## 2022-03-29 ENCOUNTER — Inpatient Hospital Stay (HOSPITAL_COMMUNITY)
Admission: AD | Admit: 2022-03-29 | Discharge: 2022-03-29 | Disposition: A | Discharge status: Home / Self Care | Payer: Medicaid Other | Attending: Obstetrics & Gynecology | Admitting: Obstetrics & Gynecology

## 2022-03-29 DIAGNOSIS — N898 Other specified noninflammatory disorders of vagina: Secondary | ICD-10-CM | POA: Diagnosis not present

## 2022-03-29 DIAGNOSIS — O23592 Infection of other part of genital tract in pregnancy, second trimester: Secondary | ICD-10-CM | POA: Insufficient documentation

## 2022-03-29 DIAGNOSIS — O3460 Maternal care for abnormality of vagina, unspecified trimester: Secondary | ICD-10-CM | POA: Diagnosis not present

## 2022-03-29 DIAGNOSIS — Z3490 Encounter for supervision of normal pregnancy, unspecified, unspecified trimester: Secondary | ICD-10-CM

## 2022-03-29 DIAGNOSIS — N750 Cyst of Bartholin's gland: Secondary | ICD-10-CM | POA: Diagnosis not present

## 2022-03-29 DIAGNOSIS — N751 Abscess of Bartholin's gland: Secondary | ICD-10-CM | POA: Diagnosis not present

## 2022-03-29 DIAGNOSIS — R102 Pelvic and perineal pain: Secondary | ICD-10-CM | POA: Insufficient documentation

## 2022-03-29 DIAGNOSIS — Z9889 Other specified postprocedural states: Secondary | ICD-10-CM

## 2022-03-29 DIAGNOSIS — Z3A16 16 weeks gestation of pregnancy: Secondary | ICD-10-CM | POA: Diagnosis not present

## 2022-03-29 DIAGNOSIS — O26892 Other specified pregnancy related conditions, second trimester: Secondary | ICD-10-CM | POA: Diagnosis present

## 2022-03-29 DIAGNOSIS — Z3200 Encounter for pregnancy test, result unknown: Secondary | ICD-10-CM

## 2022-03-29 MED ORDER — LIDOCAINE HCL (PF) 1 % IJ SOLN
INTRAMUSCULAR | Status: AC
  Administered 2022-03-29: 5 mL
  Filled 2022-03-29: qty 5

## 2022-03-29 MED ORDER — LIDOCAINE HCL (PF) 1 % IJ SOLN: 5.0000 mL | Freq: Once | INTRAMUSCULAR | Status: DC

## 2022-03-29 MED ORDER — OXYCODONE-ACETAMINOPHEN 5-325 MG PO TABS
2.0000 | ORAL_TABLET | Freq: Once | ORAL | Status: DC
  Filled 2022-03-29: qty 2

## 2022-03-29 MED ORDER — CLINDAMYCIN HCL 150 MG PO CAPS: 300.0000 mg | ORAL_CAPSULE | Freq: Three times a day (TID) | ORAL | 0 refills | Status: AC

## 2022-03-29 MED ORDER — LIDOCAINE HCL (PF) 1 % IJ SOLN
2.0000 mL | Freq: Once | INTRAMUSCULAR | Status: AC
  Administered 2022-03-29: 5 mL
  Filled 2022-03-29: qty 5

## 2022-03-29 MED ORDER — OXYCODONE-ACETAMINOPHEN 5-325 MG PO TABS: 1.0000 | ORAL_TABLET | Freq: Four times a day (QID) | ORAL | 0 refills | Status: DC | PRN

## 2022-03-29 MED ORDER — LIDOCAINE-EPINEPHRINE (PF) 1 %-1:200000 IJ SOLN
10.0000 mL | Freq: Once | INTRAMUSCULAR | Status: AC
  Administered 2022-03-29: 9 mL via INTRADERMAL
  Filled 2022-03-29: qty 10

## 2022-03-29 MED ORDER — LIDOCAINE HCL (PF) 1 % IJ SOLN: 5.0000 mL | Freq: Once | INTRAMUSCULAR | Status: AC

## 2022-03-29 MED ORDER — OXYCODONE-ACETAMINOPHEN 5-325 MG PO TABS
1.0000 | ORAL_TABLET | Freq: Once | ORAL | Status: AC
  Administered 2022-03-29: 1 via ORAL
  Filled 2022-03-29: qty 1

## 2022-03-29 NOTE — Discharge Instructions (Addendum)
Please report to the maternity assessment unit at [facility] for further evaluation and management of your affected vaginal cyst.

## 2022-03-29 NOTE — MAU Note (Signed)
...  Melissa [name] is a 21 y.o. at [redacted]w[redacted]d here in MAU reporting: Vaginal pain for the past week that has made it hard to sit down. She reports yesterday her pain became more severe and sharp so she looked at the area. She reports she felt and saw two different bumps on her right labia. She reports one of the bumps was leaking a whitish/yellow discharge coming from the area. She reports the pain seems to be radiating into the right side of her bottom. Denies VB or LOF.   She reports she took her temperature last night around 2200 and it was 102F so she took 650 mg of Tylenol.  Onset of complaint: x 1 week Pain score: 10/10 right labia  FHT: 144 doppler

## 2022-03-29 NOTE — ED Notes (Signed)
Patient is being discharged from the Urgent Care and sent to the MAU via POV . Per Reita May NP, patient is in need of higher level of care due to vaginal abscess during pregnancy. Patient is aware and verbalizes understanding of plan of care.  Vitals:   03/29/22 1209  BP: 107/74  Pulse: 96  Resp: 18  Temp: 98.3 F (36.8 C)  SpO2: 98%

## 2022-03-29 NOTE — ED Provider Notes (Signed)
[place]    CSN: [id number] Arrival date & time: 03/29/22  1105      History   Chief Complaint Chief Complaint  Patient presents with   Cyst    HPI Melissa [name] is a 21 y.o. female.   Patient presents urgent care for evaluation of what she suspects is a possible infected cyst to the vagina that she first noticed 1.5 weeks ago.  Patient states that the cyst is grown in size and pain over the last 1 and half weeks.  Patient has been taking Tylenol at home without much relief of her pain related to the vaginal cyst.  She states that she developed a 102.0 degree fever last night for which she took Tylenol with improvement.  Denies vaginal bleeding, abdominal pain, and low back pain.  States that sometimes hurts to urinate but attributes this to the infected cyst.  Denies urinary frequency, urgency, and flank pain.  She has noticed some white discharge from the vagina and is unsure if this is coming from the infected cyst versus the vaginal canal.  Tenderness to the vagina makes it difficult for her to find a position of comfort sitting or laying down.  She states that she has had vaginal cysts in the past to become infected after she shaves.  Patient last shaved her vaginal area with a clean razor 2 weeks ago.  No recent new sexual partners.  No known exposure to STI recently.  Last dose of Tylenol was yesterday.  She receives [facility] care [facility].      Past Medical History:  Diagnosis Date   Anemia    Anxiety    Asthma    inhaler. last attack June 2019   Congenital hydronephrosis 2001   Constipation    Depression    Episodic tension-type headache, not intractable 04/16/2015   Family history of adverse reaction to anesthesia    mom rushed to hospital from Dentist office   Genital herpes    Low back strain, sequela 11/10/2020   Migraine without aura and without status migrainosus, not intractable 04/16/2015   PID (pelvic  inflammatory disease)    Reactive airway disease in pediatric patient    Rh negative state in antepartum period 02/05/2019   Seizures ([place])    last one in 2015 - on meds   Sickle cell trait ([place])    TBI (traumatic brain injury) ([place]) 2006    Patient Active Problem List   Diagnosis Date Noted   Supervision of other normal pregnancy, antepartum 02/08/2022   Antepartum bleeding, first trimester 02/01/2022   Post-partum depression 11/17/2021   Post partum depression 11/16/2021   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   History of primary cesarean section 08/31/2021   Status post primary low transverse cesarean section 08/31/2021   [redacted] weeks gestation of pregnancy 08/18/2021   HSV infection 08/11/2021   Supervision of low-risk pregnancy 02/09/2021   Abnormal involuntary movements 08/04/2020   Migraine with aura and without status migrainosus, not intractable 08/12/2019   Seizures ([place])    Sickle cell trait ([place]) 02/13/2019   Rh negative, antepartum 02/05/2019   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   DMDD (disruptive mood dysregulation disorder) ([place]) 07/21/2016   MDD (major depressive disorder), recurrent severe, 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/17/2016   Chronic constipation 08/03/2015   Partial epilepsy with impairment of consciousness ([place]) 04/16/2015   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 Authorizing Provider  acetaminophen (TYLENOL) 325 MG tablet Take 650 mg by mouth every 6 (six) hours as needed.    [provider]  albuterol (VENTOLIN HFA) 108 (90 Base) MCG/ACT inhaler Inhale 1-2 puffs into the lungs every 4 (four) hours as needed for wheezing or shortness of breath. 10/07/21   Barrett Henle, MD  ARIPiprazole (ABILIFY) 2 MG tablet Take 1 tablet (2 mg total) by mouth daily. 12/15/21 12/15/22  Nwoko, Terese Door, PA  Blood Pressure Monitoring (BLOOD PRESSURE KIT) DEVI 1 kit by Does not apply route once a week. 02/08/22   Chancy Milroy, MD  docusate sodium (COLACE) 100 MG capsule Take 1 capsule (100 mg total) by mouth 2 (two) times daily. 11/22/21   Shelly Bombard, MD  ferrous sulfate 325 (65 FE) MG tablet Take 1 tablet (325 mg total) by mouth at bedtime. 11/20/21   Lavella Hammock, MD  hydrOXYzine (ATARAX) 25 MG tablet Take 1 tablet (25 mg total) by mouth 3 (three) times daily as needed for anxiety. 12/15/21   Nwoko, Terese Door, PA  loratadine (CLARITIN) 10 MG tablet Take 1 tablet (10 mg total) by mouth daily. 09/25/21   Gavin Pound, CNM  metroNIDAZOLE (METROGEL VAGINAL) 0.75 % vaginal gel Place 1 Applicatorful vaginally at bedtime. Insert one applicator, at bedtime, for 5 nights. 03/18/22   Gavin Pound, [place]. Devices (GOJJI WEIGHT SCALE) MISC 1 Device by Does not apply route every 30 (thirty) days. 02/08/22   Chancy Milroy, MD  polyethylene glycol (MIRALAX / GLYCOLAX) 17 g packet Take 17 g by mouth daily as needed for moderate constipation or mild constipation.    [provider]  Prenatal Vit-Fe Fumarate-FA (PREPLUS) 27-1 MG TABS Take 1 tablet by mouth daily. 11/20/21   Lavella Hammock, MD  valACYclovir (VALTREX) 1000 MG tablet Take 1 tablet (1,000 mg total) by mouth 2 (two) times daily. 11/22/21   Shelly Bombard, MD  venlafaxine XR (EFFEXOR-XR) 75 MG 24 hr capsule Take 1 capsule (75 mg total) by mouth daily with breakfast.  12/15/21   Nwoko, Terese Door, PA  rizatriptan (MAXALT-MLT) 10 MG disintegrating tablet Take 1 tablet at onset of migraine with 2 ibuprofen may repeat an additional tablet in 2 hours if needed 05/07/20 06/09/20  Jodi Geralds, MD    Family History Family History  Problem Relation Age of Onset   Depression Mother    Stroke Mother    Obesity Mother    Post-traumatic stress disorder Mother    Anxiety disorder Mother    Hypertension Mother    Diabetes Mother  Schizophrenia Father    Kidney disease Father     Social History Social History   Tobacco Use   Smoking status: Every Day    Types: Cigarettes   Smokeless tobacco: Never   Tobacco comments:    black and milds, 2 cigs daily  Vaping Use   Vaping Use: Never used  Substance Use Topics   Alcohol use: Not Currently    Comment: occasionally, prior to pregnancy   Drug use: Not Currently    Types: Marijuana    Comment: last 2020     Allergies   Benadryl [diphenhydramine hcl], Gluten meal, Shellfish allergy, Zithromax [azithromycin], Lactose intolerance (gi), and Latex   Review of Systems Review of Systems Per HPI  Physical Exam Triage Vital Signs ED Triage Vitals  Enc Vitals Group     BP 03/29/22 1209 107/74     Pulse Rate 03/29/22 1209 96     Resp 03/29/22 1209 18     Temp 03/29/22 1209 98.3 F (36.8 C)     Temp Source 03/29/22 1209 Oral     SpO2 03/29/22 1209 98 %     Weight --      Height --      Head Circumference --      Peak Flow --      Pain Score 03/29/22 1210 10     Pain Loc --      Pain Edu? --      Excl. in [place]? --    No data found.  Updated Vital Signs BP 107/74 (BP Location: Right Arm)   Pulse 96   Temp 98.3 F (36.8 C) (Oral)   Resp 18   LMP 12/03/2021   SpO2 98%   Visual Acuity Right Eye Distance:   Left Eye Distance:   Bilateral Distance:    Right Eye Near:   Left Eye Near:    Bilateral Near:     Physical Exam Vitals and nursing note reviewed.  Constitutional:       Appearance: Normal appearance. She is not ill-appearing or toxic-appearing.     Comments: Very pleasant patient sitting on exam in position of comfort table in no acute distress.   HENT:     Head: Normocephalic and atraumatic.     Right Ear: Hearing and external ear normal.     Left Ear: Hearing and external ear normal.     Nose: Nose normal.     Mouth/Throat:     Lips: Pink.     Mouth: Mucous membranes are moist.  Eyes:     General: Lids are normal. Vision grossly intact. Gaze aligned appropriately.     Extraocular Movements: Extraocular movements intact.     Conjunctiva/sclera: Conjunctivae normal.  Cardiovascular:     Rate and Rhythm: Normal rate and regular rhythm.     Heart sounds: Normal heart sounds, S1 normal and S2 normal.  Pulmonary:     Effort: Pulmonary effort is normal. No respiratory distress.     Breath sounds: Normal breath sounds and air entry.  Abdominal:     General: Abdomen is flat.     Palpations: Abdomen is soft.     Tenderness: There is no abdominal tenderness.  Genitourinary:      Comments: Infected Bartholin cyst that is red, erythematous, and warm to the touch to the right labia minora.  Cyst is very tender to palpation.  There is white vaginal discharge present to the external vaginal canal.  Musculoskeletal:     Cervical back:  Neck supple.  Skin:    General: Skin is warm and dry.     Capillary Refill: Capillary refill takes less than 2 seconds.     Findings: No rash.     Comments: Skin turgor normal.  Patient does not appear dehydrated on physical exam at this time.  Neurological:     General: No focal deficit present.     Mental Status: She is alert and oriented to person, place, and time. Mental status is at baseline.     Cranial Nerves: No dysarthria or facial asymmetry.     Gait: Gait is intact.  Psychiatric:        Mood and Affect: Mood normal.        Speech: Speech normal.        Behavior: Behavior normal.        Thought Content: Thought  content normal.        Judgment: Judgment normal.      UC Treatments / Results  Labs (all labs ordered are listed, but only abnormal results are displayed) Labs Reviewed - No data to display  EKG   Radiology No results found.  Procedures Procedures (including critical care time)  Medications Ordered in UC Medications - No data to display  Initial Impression / Assessment and Plan / UC Course  I have reviewed the triage vital signs and the nursing notes.  Pertinent labs & imaging results that were available during my care of the patient were reviewed by me and considered in my medical decision making (see chart for details).  1.  Infected cyst of Bartholin's gland duct in the setting of pregnancy Cyst is very tender to palpation and swollen.  Recommend incision and drainage of cyst in the maternity assessment unit for better pain management during procedure.  Discussed these recommendations with patient who verbalizes understanding and agreement with plan.  She is stable to go to the MAU via personal vehicle and does not require [facility] transport due to stable vital signs.  She is afebrile at this time, ambulatory with a steady gait, and denies dizziness.  Discussed risks of deferring MAU visit and worsening infection to the Bartholin gland and risks to pregnancy.  Patient verbalizes understanding and agreement with plan.  Discharge from urgent care in stable condition.  Final Clinical Impressions(s) / UC Diagnoses   Final diagnoses:  Infected cyst of Bartholin's gland duct  Pregnancy, unspecified gestational age  Vaginal discharge during pregnancy in second trimester     Discharge Instructions      Please report to the maternity assessment unit at [facility] for further evaluation and management of your affected vaginal cyst.      ED Prescriptions   None    PDMP not reviewed this encounter.   Talbot Grumbling, [place] 03/29/22 1359

## 2022-03-29 NOTE — ED Triage Notes (Signed)
Pt is 4 months pregnant. States she's had vaginal pain x 1.5 weeks. States she usually gets cysts in the hair follicles of her pubic area, and she assumed this was a cyst on her inner vaginal wall. Says the pain got so bad last night she looked and noticed what she thought was white purulent drainage coming from the area last night. Was unsure of whether it was puss or something with the baby. Says you can see the bulge/cyst. Denies abdominal pain, vaginal bleeding. States she did have a fever (tmax 102) yesterday, last took tylenol last night. Reports nausea, and vomiting. This is her third child. States since this has been on-going it's become more painful and difficult to urinate.

## 2022-03-29 NOTE — MAU Provider Note (Signed)
History     CSN: [id number]  Arrival date and time: 03/29/22 1316   Event Date/Time   First Provider Initiated Contact with Patient 03/29/22 1409     Chief Complaint  Patient presents with   Labial Pain   Vaginal Discharge   HPI  EDLYN [name] is a 21 y.o. [id number] at 48w4dwho presents for evaluation of vaginal pain. Patient reports since August 1 she has noticed pain in her vagina that has progressively gotten worse. She reports she now has a bump inside the right side of her vagina. Patient rates the pain as a 10/10 and has not tried anything for the pain.  She denies any vaginal bleeding, discharge, and leaking of fluid. Denies any constipation, diarrhea or any urinary complaints. Reports normal fetal movement.   OB History     Gravida  4   Para  2   Term  2   Preterm      AB  1   Living  2      SAB      IAB      Ectopic      Multiple  0   Live Births  2           Past Medical History:  Diagnosis Date   Anemia    Anxiety    Asthma    inhaler. last attack June 2019   Congenital hydronephrosis 201-01-2000  Constipation    Depression    Episodic tension-type headache, not intractable 04/16/2015   Family history of adverse reaction to anesthesia    mom rushed to hospital from Dentist office   Genital herpes    Low back strain, sequela 11/10/2020   Migraine without aura and without status migrainosus, not intractable 04/16/2015   PID (pelvic inflammatory disease)    Reactive airway disease in pediatric patient    Rh negative state in antepartum period 02/05/2019   [place]    last one in 2015 - on meds   Sickle cell trait ([place]    T[place] ([place]) ([place] 2006    Past Surgical History:  Procedure Laterality Date   APPENDECTOMY     CESAREAN SECTION N/A 08/31/2021   Procedure: CESAREAN SECTION;  Surgeon: WGwynne Edinger MD;  Location: [facility] LD [facility]S;  Service: Obstetrics;  Laterality: N/A;   HERNIA REPAIR     INTESTINAL  MALROTATION REPAIR  22001-10-04   Family History  Problem Relation Age of Onset   Depression Mother    Stroke Mother    Obesity Mother    Post-traumatic stress disorder Mother    Anxiety disorder Mother    Hypertension Mother    Diabetes Mother    Schizophrenia Father    Kidney disease Father     Social History   Tobacco Use   Smoking status: Every Day    Types: Cigarettes   Smokeless tobacco: Never   Tobacco comments:    black and milds, 2 cigs daily  Vaping Use   Vaping Use: Never used  Substance Use Topics   Alcohol use: Not Currently    Comment: occasionally, prior to pregnancy   Drug use: Not Currently    Types: Marijuana    Comment: last 2020    Allergies:  Allergies  Allergen Reactions   Benadryl [Diphenhydramine Hcl] Other (See Comments)    Causes seizures    Gluten Meal Nausea And Vomiting   Shellfish Allergy Anaphylaxis    Patient states her throat gets  tight   Zithromax [Azithromycin] Anaphylaxis and Swelling   Lactose Intolerance (Gi) Diarrhea   Latex Swelling    Labial edema after usage of latex condoms.     Medications Prior to Admission  Medication Sig Dispense Refill Last Dose   hydrOXYzine (ATARAX) 25 MG tablet Take 1 tablet (25 mg total) by mouth 3 (three) times daily as needed for anxiety. 75 tablet 1 03/29/2022   valACYclovir (VALTREX) 1000 MG tablet Take 1 tablet (1,000 mg total) by mouth 2 (two) times daily. 30 tablet 5 Past Month   acetaminophen (TYLENOL) 325 MG tablet Take 650 mg by mouth every 6 (six) hours as needed.      albuterol (VENTOLIN HFA) 108 (90 Base) MCG/ACT inhaler Inhale 1-2 puffs into the lungs every 4 (four) hours as needed for wheezing or shortness of breath. 1 each 0    ARIPiprazole (ABILIFY) 2 MG tablet Take 1 tablet (2 mg total) by mouth daily. 30 tablet 1    Blood Pressure Monitoring (BLOOD PRESSURE KIT) DEVI 1 kit by Does not apply route once a week. 1 each 0    docusate sodium (COLACE) 100 MG capsule Take 1 capsule (100  mg total) by mouth 2 (two) times daily. 60 capsule 5    ferrous sulfate 325 (65 FE) MG tablet Take 1 tablet (325 mg total) by mouth at bedtime. 30 tablet 3    loratadine (CLARITIN) 10 MG tablet Take 1 tablet (10 mg total) by mouth daily. 30 tablet 0    metroNIDAZOLE (METROGEL VAGINAL) 0.75 % vaginal gel Place 1 Applicatorful vaginally at bedtime. Insert one applicator, at bedtime, for 5 nights. 70 g 0    Misc. Devices (GOJJI WEIGHT SCALE) MISC 1 Device by Does not apply route every 30 (thirty) days. 1 each 0    polyethylene glycol (MIRALAX / GLYCOLAX) 17 g packet Take 17 g by mouth daily as needed for moderate constipation or mild constipation.      Prenatal Vit-Fe Fumarate-FA (PREPLUS) 27-1 MG TABS Take 1 tablet by mouth daily. 30 tablet 13    venlafaxine XR (EFFEXOR-XR) 75 MG 24 hr capsule Take 1 capsule (75 mg total) by mouth daily with breakfast. 30 capsule 1     Review of Systems  Constitutional: Negative.  Negative for fatigue and fever.  HENT: Negative.    Respiratory: Negative.  Negative for shortness of breath.   Cardiovascular: Negative.  Negative for chest pain.  Gastrointestinal: Negative.  Negative for abdominal pain, constipation, diarrhea, nausea and vomiting.  Genitourinary:  Positive for vaginal pain. Negative for dysuria, vaginal bleeding and vaginal discharge.  Neurological: Negative.  Negative for dizziness and headaches.   Physical Exam   Blood pressure 107/69, pulse 97, temperature 98.4 F (36.9 C), temperature source Oral, resp. rate 17, last menstrual period 12/03/2021, SpO2 100 %, not currently breastfeeding.  Patient Vitals for the past 24 hrs:  BP Temp Temp src Pulse Resp SpO2  03/29/22 1355 107/69 98.4 F (36.9 C) Oral 97 17 100 %    Physical Exam Vitals and nursing note reviewed.  Constitutional:      General: She is not in acute distress.    Appearance: She is well-developed.  HENT:     Head: Normocephalic.  Eyes:     Pupils: Pupils are equal,  round, and reactive to light.  Cardiovascular:     Rate and Rhythm: Normal rate and regular rhythm.     Heart sounds: Normal heart sounds.  Pulmonary:     Effort:  Pulmonary effort is normal. No respiratory distress.     Breath sounds: Normal breath sounds.  Abdominal:     General: Bowel sounds are normal. There is no distension.     Palpations: Abdomen is soft.     Tenderness: There is no abdominal tenderness.  Genitourinary:   Skin:    General: Skin is warm and dry.  Neurological:     Mental Status: She is alert and oriented to person, place, and time.  Psychiatric:        Mood and Affect: Mood normal.        Behavior: Behavior normal.        Thought Content: Thought content normal.        Judgment: Judgment normal.     FHT: 144 bpm   MAU Course  Procedures   MDM CNM discussed with patient options to I&D or treat with antibiotics and warm compresses. Patient desires I&D  Percocet 43m- patient refused to take full dose. Requesting to take half. CNM had lengthy discussion with patient about I&D and expected pain. CNM emphasized that we want to adequately treat her pain before doing procedure. Patient verbalized understanding and refused full dose  I&D performed by CNM and Dr. MDorena Cookeywith large amount of purulent fluid drained from bartholin's gland. Word catheter placed and urgent message sent to office for appointment for removal.   Assessment and Plan   1. Bartholin's gland abscess   2. [redacted] weeks gestation of pregnancy   3. Status post incision and drainage    -Discharge home in stable condition -Rx for percocet and cleocin sent to patient's pharmacy -Infection precautions discussed -Patient advised to follow-up with OB in 7-10 days for removal of word catheter. -Patient may return to MAU as needed or if her condition were to change or worsen  CWende Mott CNM 03/29/2022, 2:09 PM

## 2022-03-29 NOTE — Discharge Instructions (Signed)

## 2022-03-31 ENCOUNTER — Other Ambulatory Visit (HOSPITAL_COMMUNITY): Payer: Self-pay | Admitting: Physician Assistant

## 2022-03-31 DIAGNOSIS — F411 Generalized anxiety disorder: Secondary | ICD-10-CM

## 2022-04-04 NOTE — Telephone Encounter (Signed)
Patient needs a follow-up appointment in order for her medications to be prescribed.

## 2022-04-07 ENCOUNTER — Ambulatory Visit (INDEPENDENT_AMBULATORY_CARE_PROVIDER_SITE_OTHER): Payer: Medicaid Other | Admitting: Licensed Clinical Social Worker

## 2022-04-07 ENCOUNTER — Ambulatory Visit (INDEPENDENT_AMBULATORY_CARE_PROVIDER_SITE_OTHER): Payer: Medicaid Other | Admitting: Obstetrics and Gynecology

## 2022-04-07 ENCOUNTER — Encounter: Payer: Self-pay | Admitting: Obstetrics and Gynecology

## 2022-04-07 VITALS — BP 105/69 | HR 91 | Wt 223.8 lb

## 2022-04-07 DIAGNOSIS — Z3480 Encounter for supervision of other normal pregnancy, unspecified trimester: Secondary | ICD-10-CM

## 2022-04-07 DIAGNOSIS — Z3A17 17 weeks gestation of pregnancy: Secondary | ICD-10-CM

## 2022-04-07 DIAGNOSIS — Z3482 Encounter for supervision of other normal pregnancy, second trimester: Secondary | ICD-10-CM

## 2022-04-07 DIAGNOSIS — F32A Depression, unspecified: Secondary | ICD-10-CM | POA: Diagnosis not present

## 2022-04-07 DIAGNOSIS — O99340 Other mental disorders complicating pregnancy, unspecified trimester: Secondary | ICD-10-CM | POA: Diagnosis not present

## 2022-04-07 DIAGNOSIS — O209 Hemorrhage in early pregnancy, unspecified: Secondary | ICD-10-CM | POA: Diagnosis not present

## 2022-04-07 DIAGNOSIS — O26892 Other specified pregnancy related conditions, second trimester: Secondary | ICD-10-CM

## 2022-04-07 DIAGNOSIS — O26899 Other specified pregnancy related conditions, unspecified trimester: Secondary | ICD-10-CM

## 2022-04-07 DIAGNOSIS — Z98891 History of uterine scar from previous surgery: Secondary | ICD-10-CM

## 2022-04-07 DIAGNOSIS — N750 Cyst of Bartholin's gland: Secondary | ICD-10-CM | POA: Insufficient documentation

## 2022-04-07 DIAGNOSIS — Z6791 Unspecified blood type, Rh negative: Secondary | ICD-10-CM

## 2022-04-07 DIAGNOSIS — Z8619 Personal history of other infectious and parasitic diseases: Secondary | ICD-10-CM

## 2022-04-07 DIAGNOSIS — Z31430 Encounter of female for testing for genetic disease carrier status for procreative management: Secondary | ICD-10-CM

## 2022-04-07 MED ORDER — TERCONAZOLE 0.4 % VA CREA: 1.0000 | TOPICAL_CREAM | Freq: Every day | VAGINAL | 0 refills | Status: DC

## 2022-04-07 MED ORDER — HYDROXYZINE HCL 25 MG PO TABS: 25.0000 mg | ORAL_TABLET | Freq: Three times a day (TID) | ORAL | 2 refills | Status: DC | PRN

## 2022-04-07 NOTE — Addendum Note (Signed)
Addended by: Leola Brazil on: 04/07/2022 12:02 PM   Modules accepted: Orders

## 2022-04-07 NOTE — Progress Notes (Signed)
Subjective:  Melissa [name] is a 21 y.o. [id number] at [redacted]w[redacted]d being seen today for first OB appt. EDD by LMP and confirmed by first trimester U/S. Recently seen in MAU for Bartholin cyst.  H/O LTCS with last pregnancy.  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  Patient reports sore at Bartholin cyst area.  Contractions: Irritability. Vag. Bleeding: None.   . Denies leaking of fluid.   The following portions of the patient's history were reviewed and updated as appropriate: allergies, current medications, past family history, past medical history, past social history, past surgical history and problem list. Problem list updated.  Objective:   Vitals:   04/07/22 1111  BP: 105/69  Pulse: 91  Weight: 223 lb 12.8 oz (101.5 kg)    Fetal Status: Fetal Heart Rate (bpm): 156         General:  Alert, oriented and cooperative. Patient is in no acute distress.  Skin: Skin is warm and dry. No rash noted.   Cardiovascular: Normal heart rate noted  Respiratory: Normal respiratory effort, no problems with respiration noted  Abdomen: Soft, gravid, appropriate for gestational age. Pain/Pressure: Absent     Pelvic:       right bartholin cyst @ 1 cm  Extremities: Normal range of motion.  Edema: Trace  Mental Status: Normal mood  and affect. Normal behavior. Normal judgment and thought content.   Urinalysis:      Assessment and Plan:  Pregnancy: [id number] at [redacted]w[redacted]d  1. Antepartum bleeding, first trimester Resolved Received Rhogam  2. Supervision of other normal pregnancy, antepartum Prenatal care and labs reviewed with pt Genetic testing discussed - AFP, Serum, Open Spina Bifida - [place] MFM OB COMP + 14 WK; Future - CBC/D/Plt+RPR+Rh+ABO+RubIgG...  3. Rh negative, antepartum Rhogam as indicated  4. H/O c section     Discuss TOLAC at later appt  5. H/O HSV     Suppression at 36 weeks  6. Bartholin cyst     Pt declined I & D in office today     Will complete antibiotic therapy.    Sitz baths  Preterm labor symptoms and general obstetric precautions including but not limited to vaginal bleeding, contractions, leaking of fluid and fetal movement were reviewed in detail with the patient. Please refer to After Visit Summary for other counseling recommendations.  Return in about 4 weeks (around 05/05/2022) for OB visit, face to face, MD only.   Hermina Staggers, MD

## 2022-04-07 NOTE — Progress Notes (Signed)
Pt presents for ROB. NOB scheduled 04/28/22  Word cath expelled on it's own 3 days left of Clindamycin rx  Possible yeast infection. Reports vaginal itching with irritation.

## 2022-04-07 NOTE — Patient Instructions (Signed)
Second Trimester of Pregnancy  The second trimester of pregnancy is from week 13 through week 27. This is months 4 through 6 of pregnancy. The second trimester is often a time when you feel your best. Your body has adjusted to being pregnant, and you begin to feel better physically. During the second trimester: Morning sickness has lessened or stopped completely. You may have more energy. You may have an increase in appetite. The second trimester is also a time when the unborn baby (fetus) is growing rapidly. At the end of the sixth month, the fetus may be up to 12 inches long and weigh about 1 pounds. You will likely begin to feel the baby move (quickening) between 16 and 20 weeks of pregnancy. Body changes during your second trimester Your body continues to go through many changes during your second trimester. The changes vary and generally return to normal after the baby is born. Physical changes Your weight will continue to increase. You will notice your lower abdomen bulging out. You may begin to get stretch marks on your hips, abdomen, and breasts. Your breasts will continue to grow and to become tender. Dark spots or blotches (chloasma or mask of pregnancy) may develop on your face. A dark line from your belly button to the pubic area (linea nigra) may appear. You may have changes in your hair. These can include thickening of your hair, rapid growth, and changes in texture. Some people also have hair loss during or after pregnancy, or hair that feels dry or thin. Health changes You may develop headaches. You may have heartburn. You may develop constipation. You may develop hemorrhoids or swollen, bulging veins (varicose veins). Your gums may bleed and may be sensitive to brushing and flossing. You may urinate more often because the fetus is pressing on your bladder. You may have back pain. This is caused by: Weight gain. Pregnancy hormones that are relaxing the joints in your  pelvis. A shift in weight and the muscles that support your balance. Follow these instructions at home: Medicines Follow your health care provider's instructions regarding medicine use. Specific medicines may be either safe or unsafe to take during pregnancy. Do not take any medicines unless approved by your health care provider. Take a prenatal vitamin that contains at least 600 micrograms (mcg) of folic acid. Eating and drinking Eat a healthy diet that includes fresh fruits and vegetables, whole grains, good sources of protein such as meat, eggs, or tofu, and low-fat dairy products. Avoid raw meat and unpasteurized juice, milk, and cheese. These carry germs that can harm you and your baby. You may need to take these actions to prevent or treat constipation: Drink enough fluid to keep your urine pale yellow. Eat foods that are high in fiber, such as beans, whole grains, and fresh fruits and vegetables. Limit foods that are high in fat and processed sugars, such as fried or sweet foods. Activity Exercise only as directed by your health care provider. Most people can continue their usual exercise routine during pregnancy. Try to exercise for 30 minutes at least 5 days a week. Stop exercising if you develop contractions in your uterus. Stop exercising if you develop pain or cramping in the lower abdomen or lower back. Avoid exercising if it is very hot or humid or if you are at a high altitude. Avoid heavy lifting. If you choose to, you may have sex unless your health care provider tells you not to. Relieving pain and discomfort Wear a supportive   bra to prevent discomfort from breast tenderness. Take warm sitz baths to soothe any pain or discomfort caused by hemorrhoids. Use hemorrhoid cream if your health care provider approves. Rest with your legs raised (elevated) if you have leg cramps or low back pain. If you develop varicose veins: Wear support hose as told by your health care  provider. Elevate your feet for 15 minutes, 3-4 times a day. Limit salt in your diet. Safety Wear your seat belt at all times when driving or riding in a car. Talk with your health care provider if someone is verbally or physically abusive to you. Lifestyle Do not use hot tubs, steam rooms, or saunas. Do not douche. Do not use tampons or scented sanitary pads. Avoid cat litter boxes and soil used by cats. These carry germs that can cause birth defects in the baby and possibly loss of the fetus by miscarriage or stillbirth. Do not use herbal remedies, alcohol, illegal drugs, or medicines that are not approved by your health care provider. Chemicals in these products can harm your baby. Do not use any products that contain nicotine or tobacco, such as cigarettes, e-cigarettes, and chewing tobacco. If you need help quitting, ask your health care provider. General instructions During a routine prenatal visit, your health care provider will do a physical exam and other tests. He or she will also discuss your overall health. Keep all follow-up visits. This is important. Ask your health care provider for a referral to a local prenatal education class. Ask for help if you have counseling or nutritional needs during pregnancy. Your health care provider can offer advice or refer you to specialists for help with various needs. Where to find more information American Pregnancy Association: americanpregnancy.org American College of Obstetricians and Gynecologists: acog.org/en/Womens%20Health/Pregnancy Office on Women's Health: womenshealth.gov/pregnancy Contact a health care provider if you have: A headache that does not go away when you take medicine. Vision changes or you see spots in front of your eyes. Mild pelvic cramps, pelvic pressure, or nagging pain in the abdominal area. Persistent nausea, vomiting, or diarrhea. A bad-smelling vaginal discharge or foul-smelling urine. Pain when you  urinate. Sudden or extreme swelling of your face, hands, ankles, feet, or legs. A fever. Get help right away if you: Have fluid leaking from your vagina. Have spotting or bleeding from your vagina. Have severe abdominal cramping or pain. Have difficulty breathing. Have chest pain. Have fainting spells. Have not felt your baby move for the time period told by your health care provider. Have new or increased pain, swelling, or redness in an arm or leg. Summary The second trimester of pregnancy is from week 13 through week 27 (months 4 through 6). Do not use herbal remedies, alcohol, illegal drugs, or medicines that are not approved by your health care provider. Chemicals in these products can harm your baby. Exercise only as directed by your health care provider. Most people can continue their usual exercise routine during pregnancy. Keep all follow-up visits. This is important. This information is not intended to replace advice given to you by your health care provider. Make sure you discuss any questions you have with your health care provider. Document Revised: 01/14/2020 Document Reviewed: 11/20/2019 Elsevier Patient Education  2023 Elsevier Inc.  

## 2022-04-09 LAB — AFP, SERUM, OPEN SPINA BIFIDA
AFP MoM: 4.1
AFP Value: 150.9 ng/mL
Gest. Age on Collection Date: 17.6 weeks
Maternal Age At EDD: 23 yr
OSBR Risk 1 IN: 45
Test Results:: POSITIVE — AB
Weight: 223 [lb_av]

## 2022-04-09 LAB — CBC/D/PLT+RPR+RH+ABO+RUBIGG...
Antibody Screen: NEGATIVE
Basophils Absolute: 0 10*3/uL (ref 0.0–0.2)
Basos: 0 %
EOS (ABSOLUTE): 0.1 10*3/uL (ref 0.0–0.4)
Eos: 2 %
HCV Ab: NONREACTIVE
HIV Screen 4th Generation wRfx: NONREACTIVE
Hematocrit: 30.7 % — ABNORMAL LOW (ref 34.0–46.6)
Hemoglobin: 10.5 g/dL — ABNORMAL LOW (ref 11.1–15.9)
Hepatitis B Surface Ag: NEGATIVE
Immature Grans (Abs): 0.1 10*3/uL (ref 0.0–0.1)
Immature Granulocytes: 1 %
Lymphocytes Absolute: 1.8 10*3/uL (ref 0.7–3.1)
Lymphs: 26 %
MCH: 25.8 pg — ABNORMAL LOW (ref 26.6–33.0)
MCHC: 34.2 g/dL (ref 31.5–35.7)
MCV: 75 fL — ABNORMAL LOW (ref 79–97)
Monocytes Absolute: 0.6 10*3/uL (ref 0.1–0.9)
Monocytes: 8 %
Neutrophils Absolute: 4.5 10*3/uL (ref 1.4–7.0)
Neutrophils: 63 %
Platelets: 237 10*3/uL (ref 150–450)
RBC: 4.07 x10E6/uL (ref 3.77–5.28)
RDW: 14.3 % (ref 11.7–15.4)
RPR Ser Ql: NONREACTIVE
Rh Factor: NEGATIVE
Rubella Antibodies, IGG: 7.75 index (ref >0.99)
WBC: 7.1 10*3/uL (ref 3.4–10.8)

## 2022-04-09 LAB — HCV INTERPRETATION
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
## 2022-04-12 ENCOUNTER — Encounter: Payer: Self-pay | Admitting: Obstetrics and Gynecology

## 2022-04-12 ENCOUNTER — Telehealth: Payer: Self-pay | Admitting: Emergency Medicine

## 2022-04-12 ENCOUNTER — Other Ambulatory Visit: Payer: Self-pay | Admitting: Obstetrics and Gynecology

## 2022-04-12 DIAGNOSIS — O280 Abnormal hematological finding on antenatal screening of mother: Secondary | ICD-10-CM

## 2022-04-12 NOTE — Telephone Encounter (Signed)
TC to patient to discuss abnormal AFP. Informed that results of this test are NOT diagnostic. Informed of genetic counseling referral and appointment. Has questions about fetal gender, but results are not yet available.

## 2022-04-14 ENCOUNTER — Encounter (HOSPITAL_COMMUNITY): Payer: Self-pay | Admitting: Obstetrics and Gynecology

## 2022-04-14 ENCOUNTER — Inpatient Hospital Stay (HOSPITAL_COMMUNITY)
Admission: AD | Admit: 2022-04-14 | Discharge: 2022-04-14 | Disposition: A | Discharge status: Home / Self Care | Payer: Medicaid Other | Attending: Obstetrics and Gynecology | Admitting: Obstetrics and Gynecology

## 2022-04-14 ENCOUNTER — Other Ambulatory Visit: Payer: Self-pay | Admitting: Student

## 2022-04-14 DIAGNOSIS — F17210 Nicotine dependence, cigarettes, uncomplicated: Secondary | ICD-10-CM | POA: Insufficient documentation

## 2022-04-14 DIAGNOSIS — O26892 Other specified pregnancy related conditions, second trimester: Secondary | ICD-10-CM | POA: Diagnosis present

## 2022-04-14 DIAGNOSIS — O99332 Smoking (tobacco) complicating pregnancy, second trimester: Secondary | ICD-10-CM | POA: Diagnosis not present

## 2022-04-14 DIAGNOSIS — R55 Syncope and collapse: Secondary | ICD-10-CM

## 2022-04-14 DIAGNOSIS — R42 Dizziness and giddiness: Secondary | ICD-10-CM | POA: Insufficient documentation

## 2022-04-14 DIAGNOSIS — Z5941 Food insecurity: Secondary | ICD-10-CM

## 2022-04-14 DIAGNOSIS — Z3A18 18 weeks gestation of pregnancy: Secondary | ICD-10-CM | POA: Insufficient documentation

## 2022-04-14 DIAGNOSIS — O99012 Anemia complicating pregnancy, second trimester: Secondary | ICD-10-CM | POA: Diagnosis not present

## 2022-04-14 DIAGNOSIS — Z3480 Encounter for supervision of other normal pregnancy, unspecified trimester: Secondary | ICD-10-CM

## 2022-04-14 LAB — URINALYSIS, ROUTINE W REFLEX MICROSCOPIC
Bilirubin Urine: NEGATIVE
Glucose, UA: NEGATIVE mg/dL
Hgb urine dipstick: NEGATIVE
Ketones, ur: NEGATIVE mg/dL
Nitrite: NEGATIVE
Protein, ur: 30 mg/dL — AB
Specific Gravity, Urine: 1.017 (ref 1.005–1.030)
WBC, UA: >50 WBC/hpf — ABNORMAL HIGH (ref 0–5)
pH: 6 (ref 5.0–8.0)

## 2022-04-14 LAB — GLUCOSE, CAPILLARY: Glucose-Capillary: 114 mg/dL — ABNORMAL HIGH (ref 70–99)

## 2022-04-14 MED ORDER — FERROUS SULFATE 325 (65 FE) MG PO TABS
325.0000 mg | ORAL_TABLET | Freq: Every evening | ORAL | 3 refills | Status: DC
Start: 2022-04-14 — End: 2023-08-28

## 2022-04-14 NOTE — MAU Note (Addendum)
...  Melissa [name] is a 21 y.o. at [redacted]w[redacted]d here in MAU reporting: Around 1230 while at work she was walking to go on break and she started seeing black spots in her field of vision. She reports at this time she sat down and "everything went black." She reports she did not lose consciousness. Denies VB or LOF. She reports she has a rash on the insides of her legs and to her buttock region. Endorses lower back pain since the beginning of her pregnancy.   She reports today is her first day back to work since finding out she was pregnant. 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 She reports she wonders if her iron is low or if her thyroid levels are low. Has not taken her iron pills for over a month.  Last PO's: 1000 PB&J and banana with water.  1230 fries  Onset of complaint: Today around 1230 Pain score: 7/10 lower back  FHT: 153 doppler Lab orders placed from triage: UA

## 2022-04-14 NOTE — MAU Note (Signed)
Sandwich tray given 

## 2022-04-14 NOTE — MAU Provider Note (Signed)
Patient Melissa [name] is a 21 y.o. [phone] at [redacted]w[redacted]d here with complaints of feeling dizzy and lightheaded at work. She denies chest pain, SOB, fever.  She denies vaginal bleeding, contractions, leaking of fluid.      History     CSN: [id number]  Arrival date and time: 04/14/22 1248   Event Date/Time   First Provider Initiated Contact with Patient 04/14/22 1318      Chief Complaint  Patient presents with   Near Syncope   Near Syncope This is a new problem. The current episode started today. The problem has been resolved. Pertinent negatives include no abdominal pain, chest pain, congestion, coughing, fever, visual change or vomiting. The symptoms are aggravated by standing. She has tried rest, sleep and position changes for the symptoms.  The patient reports that she went to bed at 11 last night; her daughter is 77 months old and woke her up at 3 am; after settling her she woke up again at 8. She did not eat anything until 10:30, after taking a shower. She felt light headed at this time, but decided to go to work. 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 She did not fall or hit her head.   Today was her first day back at work. She endorses history of syncope in the past when she doesn't eat and also that she is anemica but not taking her iron pills.   She reports occasionally difficulty having enough food to eat in her house.   OB History     Gravida  4   Para  2   Term  2   Preterm      AB  1   Living  2      SAB      IAB      Ectopic      Multiple  0   Live Births  2           Past Medical History:  Diagnosis Date   Anemia    Anxiety    Asthma    inhaler. last attack June 2019   Congenital hydronephrosis 2001   Constipation    Depression    Episodic tension-type headache, not intractable 04/16/2015   Family history of adverse reaction  to anesthesia    mom rushed to hospital from Dentist office   Genital herpes    HSV infection 08/11/2021   CONCERN for primary outbreak at [redacted]w[redacted]d >>see 12/20 office note    Low back strain, sequela 11/10/2020   Migraine without aura and without status migrainosus, not intractable 04/16/2015   PID (pelvic inflammatory disease)    Reactive airway disease in pediatric patient    Rh negative state in antepartum period 02/05/2019   Seizures ([name])    last one in 2015 - on meds   Sickle cell trait ([name])    Status post primary low transverse cesarean section 08/31/2021   TBI (traumatic brain injury) ([name]) 2006    Past Surgical History:  Procedure Laterality Date   APPENDECTOMY     CESAREAN SECTION N/A 08/31/2021   Procedure: CESAREAN SECTION;  Surgeon: Kathrynn Running, MD;  Location: [facility] ORS;  Service: Obstetrics;  Laterality: N/A;   HERNIA REPAIR     INTESTINAL MALROTATION REPAIR  2001    Family History  Problem Relation Age of Onset   Depression Mother  Stroke Mother    Obesity Mother    Post-traumatic stress disorder Mother    Anxiety disorder Mother    Hypertension Mother    Diabetes Mother    Schizophrenia Father    Kidney disease Father     Social History   Tobacco Use   Smoking status: Every Day    Packs/day: 0.25    Types: Cigarettes   Smokeless tobacco: Never   Tobacco comments:    black and milds, 2 cigs daily  Vaping Use   Vaping Use: Never used  Substance Use Topics   Alcohol use: Not Currently    Comment: occasionally, prior to pregnancy   Drug use: Not Currently    Types: Marijuana    Comment: last 2020    Allergies:  Allergies  Allergen Reactions   Benadryl [Diphenhydramine Hcl] Other (See Comments)    Causes seizures    Gluten Meal Nausea And Vomiting   Shellfish Allergy Anaphylaxis    Patient states her throat gets tight   Zithromax [Azithromycin] Anaphylaxis and Swelling   Haldol [Haloperidol] Swelling    Tongue swelling.   Lactose  Intolerance (Gi) Diarrhea   Latex Swelling    Labial edema after usage of latex condoms.     No medications prior to admission.    Review of Systems  Constitutional:  Negative for fever.  HENT:  Negative for congestion.   Respiratory:  Negative for cough.   Cardiovascular:  Positive for near-syncope. Negative for chest pain.  Gastrointestinal:  Negative for abdominal pain and vomiting.   Physical Exam   Blood pressure (!) 94/50, pulse 91, temperature 98.1 F (36.7 C), temperature source Oral, resp. rate 13, last menstrual period 12/03/2021, SpO2 99 %, not currently breastfeeding.  Physical Exam Constitutional:      Appearance: Normal appearance.  Pulmonary:     Effort: Pulmonary effort is normal.  Abdominal:     General: Abdomen is flat.  Musculoskeletal:        General: Normal range of motion.  Neurological:     General: No focal deficit present.     Mental Status: She is alert.  Psychiatric:        Mood and Affect: Mood normal.        Behavior: Behavior normal.     MAU Course  Procedures  MDM  -FHR by Doppler is 153 -Blood glucose is 114 -UA shows no signs of dehydration; did not check CBC as it was checked 10 days ago and it was 10.5  Patient Vitals for the past 24 hrs:  BP Temp Temp src Pulse Resp SpO2  04/14/22 1513 (!) 94/50 -- -- 91 13 99 %  04/14/22 1314 104/62 -- -- 90 -- --  04/14/22 1305 106/63 98.1 F (36.7 C) Oral 94 14 99 %   Reassessment (4:07 PM) Patient has eaten entire sandwich and is now sleeping on her left side. -EKG is normal (no changes from prior EKG x3 in Feb, March and April 2023) Patient reports that she feels better after eating and resting  Assessment and Plan   1. Supervision of other normal pregnancy, antepartum   2. Near syncope    -referral placed for [facility] -patient stable for discharge with Rx for iron, discussed snacking frequently with high protein snacks -start taking iron supplements, new RX  sent  Charlesetta Garibaldi Marjorie Deprey 04/14/2022, 4:07 PM

## 2022-04-17 ENCOUNTER — Encounter: Payer: Self-pay | Admitting: Obstetrics and Gynecology

## 2022-04-26 NOTE — BH Specialist Note (Signed)
error 

## 2022-04-28 ENCOUNTER — Encounter: Payer: Medicaid Other | Admitting: Obstetrics & Gynecology

## 2022-05-01 ENCOUNTER — Ambulatory Visit: Payer: Medicaid Other

## 2022-05-01 ENCOUNTER — Other Ambulatory Visit: Payer: Self-pay

## 2022-05-02 ENCOUNTER — Ambulatory Visit: Payer: Medicaid Other | Admitting: Clinical

## 2022-05-03 ENCOUNTER — Ambulatory Visit: Payer: Medicaid Other | Attending: Obstetrics and Gynecology | Admitting: *Deleted

## 2022-05-03 ENCOUNTER — Ambulatory Visit: Payer: Medicaid Other | Attending: Obstetrics and Gynecology | Admitting: Obstetrics and Gynecology

## 2022-05-03 ENCOUNTER — Ambulatory Visit (HOSPITAL_BASED_OUTPATIENT_CLINIC_OR_DEPARTMENT_OTHER): Payer: Medicaid Other

## 2022-05-03 VITALS — BP 95/63 | HR 84

## 2022-05-03 DIAGNOSIS — O99012 Anemia complicating pregnancy, second trimester: Secondary | ICD-10-CM | POA: Insufficient documentation

## 2022-05-03 DIAGNOSIS — O99352 Diseases of the nervous system complicating pregnancy, second trimester: Secondary | ICD-10-CM | POA: Insufficient documentation

## 2022-05-03 DIAGNOSIS — Z3480 Encounter for supervision of other normal pregnancy, unspecified trimester: Secondary | ICD-10-CM

## 2022-05-03 DIAGNOSIS — O99512 Diseases of the respiratory system complicating pregnancy, second trimester: Secondary | ICD-10-CM | POA: Diagnosis not present

## 2022-05-03 DIAGNOSIS — Z3A21 21 weeks gestation of pregnancy: Secondary | ICD-10-CM | POA: Insufficient documentation

## 2022-05-03 DIAGNOSIS — O34219 Maternal care for unspecified type scar from previous cesarean delivery: Secondary | ICD-10-CM | POA: Diagnosis not present

## 2022-05-03 DIAGNOSIS — D573 Sickle-cell trait: Secondary | ICD-10-CM | POA: Insufficient documentation

## 2022-05-03 DIAGNOSIS — Z362 Encounter for other antenatal screening follow-up: Secondary | ICD-10-CM

## 2022-05-03 DIAGNOSIS — O98512 Other viral diseases complicating pregnancy, second trimester: Secondary | ICD-10-CM | POA: Insufficient documentation

## 2022-05-03 DIAGNOSIS — J45909 Unspecified asthma, uncomplicated: Secondary | ICD-10-CM | POA: Insufficient documentation

## 2022-05-03 DIAGNOSIS — G40909 Epilepsy, unspecified, not intractable, without status epilepticus: Secondary | ICD-10-CM | POA: Diagnosis not present

## 2022-05-03 DIAGNOSIS — O280 Abnormal hematological finding on antenatal screening of mother: Secondary | ICD-10-CM | POA: Diagnosis not present

## 2022-05-03 DIAGNOSIS — O99212 Obesity complicating pregnancy, second trimester: Secondary | ICD-10-CM | POA: Diagnosis not present

## 2022-05-03 NOTE — Progress Notes (Signed)
Maternal-Fetal Medicine   Name: Melissa [name] DOB: [birth year] MRN: [id number] Referring Provider: Arlina Robes, MD  I had the pleasure of seeing [name]. She is G4 P2012 at 21w 4d gestation and is here for fetal anatomy scan and genetic counseling. Increased MSAFP was seen on screening (4.1 MoM) and the risk for open neural tube defect was increased at 1 and 45.  Patient does not give history of vaginal bleeding. On cell-free fetal Linea screening, the risks of fetal aneuploidies are not increased.  Obstetric history -2020: Term vaginal delivery of a female infant weighing 7 pounds and 2 ounces at birth.  Her daughter is in good health. -08/2021: Term cesarean delivery (genital herpes) of a female infant weighing 8 pounds at birth.  Her daughter is in good health. She had 1 spontaneous miscarriage in 2022.  GYN history: No history of abnormal Pap smears or cervical surgeries. Past medical history: No history of hypertension or diabetes or any chronic medical conditions.  She has sickle cell trait and her partner's carrier status is not known. Past surgical history: Cesarean section, appendectomy, intestinal malrotation repair. Allergies: Benadryl (seizures), shellfish, Zithromax, Haldol, latex (see epic for details). Social history: Denies drug or alcohol use.  She reports she smokes 1 cigarette daily.  Her partner is African-American and he is not involved in this pregnancy. Family history: No history of venous thromboembolism in the family.  Ultrasound We performed a fetal anatomical survey.  Amniotic fluid is considerably decreased and the maximum vertical pocket measures 2 cm.  Fetal biometry is consistent with the previously established dates.  No markers of aneuploidies or obvious fetal structural defects are seen.  Intracranial structures and abdominal wall appear normal.  The spine appears normal but limited evaluation  because of low amniotic fluid.  Placenta appears normal and there is no evidence of previa or placenta accreta spectrum.  Increased risk for open-neural tube defects and increased AFP  I reassured the patient of normal fetal anatomy on ultrasound including spine and intracranial structures. I informed her that ultrasound can detect up to 95 out of 100 cases of spina bifida and that amniocentesis will only marginally improve the detection rate.  Increased AFP can also be associated with fetal growth restriction (placental insufficiency) and preterm delivery. Low amniotic fluid is associated with placental insufficiency and stillbirth is associated with increased AFP in some cases.  I recommended serial fetal growth assessments.   Patient met with our genetic counselor after ultrasound. See separate letter from our genetic counselor.  Recommendations -An appointment was made for her to return in 3 weeks for fetal growth and amniotic fluid assessments. -I advised the patient to call your office if she complains of decreased fetal movements.   Thank you for consultation.  If you have any questions or concerns, please contact me the [facility].  Consultation including face-to-face (more than 50%) counseling 30 minutes.

## 2022-05-04 ENCOUNTER — Other Ambulatory Visit: Payer: Self-pay | Admitting: *Deleted

## 2022-05-04 DIAGNOSIS — O4102X0 Oligohydramnios, second trimester, not applicable or unspecified: Secondary | ICD-10-CM

## 2022-05-04 DIAGNOSIS — Z362 Encounter for other antenatal screening follow-up: Secondary | ICD-10-CM

## 2022-05-04 DIAGNOSIS — O99212 Obesity complicating pregnancy, second trimester: Secondary | ICD-10-CM

## 2022-05-04 DIAGNOSIS — O289 Unspecified abnormal findings on antenatal screening of mother: Secondary | ICD-10-CM

## 2022-05-04 NOTE — Progress Notes (Signed)
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   EDC: 09/09/2022 LMP: 12/03/2021 Referring Provider:  Hermina Staggers, MD  EGA: [redacted]w[redacted]d Genetic Counselor: Teena Dunk, MS, CGC  OB Hx: [id number] Date of Appointment: 05/03/2022  Accompanied by: [name] Face to Face Time: 30 Minutes   Previous Testing Completed: Breia previously completed cell-free DNA screening (cfDNA) in this pregnancy. The result is low risk, consistent with a female fetus. This screening significantly reduces the risk that the current pregnancy has Down syndrome, Trisomy 68, Trisomy 67, and common sex chromosome conditions, however, the risk is not zero given the limitations of cfDNA. Additionally, there are many genetic conditions that cannot be detected by cfDNA.  Mettie previously completed [facility] Carrier Screening in a previous pregnancy (scanned into Epic under the Media tab). She screened to be a carrier for Sickle Cell Disease: Hb A/S. She screened to not be a carrier for 13 out of 14 conditions on the panel including Fragile X syndrome. A negative result on carrier screening reduces the likelihood of being a carrier, however, does not entirely rule out the possibility. See genetic counseling portion of the note below.  msAFP from 04/07/2022 reviewed. The result is screen positive (abnormal). See genetic counseling portion of the note below.    Family History: A pedigree was created at [employer] genetic counseling appointment in her previous pregnancy on 05/12/2021. Melissa [name] reports that the current pregnancy has a different biological father from her previous pregnancy. Kimberlynn reports the biological father of the current pregnancy is not involved and  she declined to provide information about him and his family history. Ralonda's only update to her family history is her daughter has been diagnosed with Autism.      Genetic Counseling:   Abnormal Maternal Serum AFP. Shanina was seen for genetic counseling to discuss her Maternal Serum AFP (MSAFP) result. To review, the MSAFP is a maternal blood test that measures the AFP level in the maternal serum to determine if a pregnancy is at higher risk for certain birth defects or problems; however, it cannot diagnose or rule out these conditions. Caterina's MSAFP result indicates there is an increased level of AFP. The value was 4.10 MoM (multiple of the median) which is above the cutoff of 2.8 MoM. Increased levels of AFP can occur for many reasons including inaccurate pregnancy dating, the presence of twins, pregnancy complications, abdominal wall defects, open neural tube defects (ONTDs), or a normal pregnancy. The laboratory reports that the risk for Melissa [name]'s pregnancy to have an ONTD is 1 in 23. Melissa [name]'s anatomy ultrasound on 05/03/22 did not detect an ONTD (please see separate report for details). Prenatal detection of ONTDs is largely influenced by the gestational age and type of neural tube defect. A second trimester ultrasound identifies virtually 100% of anencephaly cases. A second trimester ultrasound also routinely evaluates for spina bifida: the detection rate is approximately 90-98%. We discussed that Melissa [name] has the option of continuing to monitor the pregnancy via routine ultrasounds with the understanding that a normal ultrasound does not guarantee a healthy pregnancy. We also discussed the option of amniocentesis to evaluate the amniotic fluid AFP with reflex to acetylcholinesterase if indicated.   Carrier of Sickle Cell Disease. 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 Hemoglobin S is a structural hemoglobin variant that replaces one of the ?-chains of hemoglobin. It is caused by  mutations in the HBB gene. Carriers of sickle cell trait typically do not have any clinical features, however, may experience sickling under extreme conditions. Individuals who carry sickle cell trait would have a 25% risk to have offspring affected with Sickle Cell Disease if their reproductive partner is also found to be a carrier for a ?-globin chain abnormality (recessive inheritance). Sickle Cell Disease caused by the homozygous HBB variant p.Glu6Val (Hb S/S), is the most common cause of Sickle Cell Disease in the [place]. Other Sickle Cell Disorders caused by compound heterozygous HBB pathogenic variants includes Sickle-Hemoglobin C Disease (Hb S/C) and two types of Sickle ?-Thalassemia (Hb S/?+ and Hb S/?). Other ?-globin chain variants such as D-Punjab, O-Arab, and E also result in Sickle Cell Disorders when inherited with HbS.  Most individuals with Sickle Cell Disease are healthy at birth and become symptomatic later in infancy or childhood after fetal hemoglobin levels decrease. Symptoms include infants with spontaneous painful swelling of the hands/feet, recurrent episodes of severe pain with no other identified etiology, unexplained anemia not related to iron deficiency, pallor, jaundice, pneumococcal sepsis or meningitis, severe anemia with splenic enlargement, childhood stroke, etc. Given that Melissa [name] is a carrier of sickle cell trait (Hb A/S) genetic counseling offered screening for the biological father of the pregnancy for ? Hemoglobinopathies. Melissa [name] declined carrier screening for ? Hemoglobinopathies for the biological father of the pregnancy at this time. She reports to genetic counseling that the biological father of the pregnancy is not involved. We reviewed that Sickle Cell Disease is one of the conditions included in [place]'s newborn screening program.  Previous Child with Autism Spectrum Disorder. Melissa [name]'s oldest daughter has a recent diagnosis of Autism. Autism Spectrum  Disorder affects approximately 1-2% of the general population in the [place], [place], and [place]. Autism is a neurological and developmental disorder that affects how people interact with others, communicate, learn, and behave. Autism is known as a "spectrum" disorder because there is wide variation in the type and severity of symptoms people experience. Some interaction/communication behaviors people with Autism may have include making little or inconsistent eye contact, having trouble understanding another person's point of view, difficulties sharing in imaginative play or in making friends, etc. Some restrictive/repetitive behaviors people with Autism may exhibit include having a lasting intense interest in specific topics, being more sensitive or less sensitive than other people to sensory input, repeating words or phrases, etc. Aniya reports that her daughter has not been seen by pediatric genetics and has not had any genetic testing for conditions associated with Autism. We briefly discussed that Roneisha previously completed carrier screening and was found to not be a carrier for Fragile X syndrome (a condition in which autism is a feature). We reviewed that risk for autism for the current pregnancy may be increased above the general population risk, however, we cannot directly test for autism in pregnancy. If Danessa's daughter was to be diagnosed with a genetic cause of autism in the future a more accurate recurrence risk could be quoted for this pregnancy and future pregnancies.     Testing/Screening Options:   Amniocentesis for Prenatal Diagnosis. This procedure involves the removal of a small amount of amniotic fluid from the sac surrounding the pregnancy with the use of a thin needle inserted through the maternal abdomen and uterus. Ultrasound guidance is  used throughout the procedure. Possible procedural difficulties and complications that can arise include maternal infection, cramping,  bleeding, fluid leakage, and/or pregnancy loss. The risk for pregnancy loss with an amniocentesis is 1/500. Per the [employer] (ACOG) Practice Bulletin 162, all pregnant women should be offered prenatal assessment for aneuploidy by diagnostic testing regardless of maternal age or other risk factors. If indicated, genetic testing that could be ordered on an amniocentesis sample includes a karyotype, microarray, and testing for specific syndromes. A karyotype can detect chromosomal aneuploidies as well as large deletions or duplications of chromosomal material and chromosomal rearrangements. Microarray assesses for smaller pieces of chromosomal material that are missing or extra that fall below the detection range of a karyotype. These chromosomal changes can be associated with various microdeletion or microduplication syndromes that could have impacts on one's health or development. A microarray can also detect some microdeletion and microduplications that have unclear clinical relevance (variants of uncertain significance).  Carrier Screening. Per the ACOG Committee Opinion 691, if an individual is found to be a carrier for a specific condition, the individual's reproductive partner should be offered testing in order to receive informed genetic counseling about potential reproductive outcomes. genetic counseling offered screening the biological father of the pregnancy for ? Hemoglobinopathies. Jrue declined carrier screening for ? Hemoglobinopathies for the biological father of the pregnancy at this time. She reports to genetic counseling that the biological father of the pregnancy is not involved.      Patient Plan:  Proceed with: Routine prenatal care Informed consent was obtained. All questions were answered.  Declined: Amniocentesis for prenatal diagnostic studies Carrier screening for the biological father of the pregnancy   Thank you for sharing in the  care of Jermani with [place].  Please do not hesitate to contact us if you have any questions.  Teena Dunk, MS, [facility]

## 2022-05-11 ENCOUNTER — Encounter: Payer: Self-pay | Admitting: Obstetrics and Gynecology

## 2022-05-11 DIAGNOSIS — O4100X0 Oligohydramnios, unspecified trimester, not applicable or unspecified: Secondary | ICD-10-CM | POA: Insufficient documentation

## 2022-05-17 ENCOUNTER — Encounter (HOSPITAL_COMMUNITY): Payer: Self-pay | Admitting: Obstetrics & Gynecology

## 2022-05-17 ENCOUNTER — Inpatient Hospital Stay (HOSPITAL_BASED_OUTPATIENT_CLINIC_OR_DEPARTMENT_OTHER): Payer: Medicaid Other

## 2022-05-17 ENCOUNTER — Inpatient Hospital Stay (HOSPITAL_COMMUNITY)
Admission: AD | Admit: 2022-05-17 | Discharge: 2022-05-22 | DRG: 832 | Discharge status: Against Medical Advice | Payer: Medicaid Other | Attending: Obstetrics and Gynecology | Admitting: Obstetrics and Gynecology

## 2022-05-17 DIAGNOSIS — O09892 Supervision of other high risk pregnancies, second trimester: Secondary | ICD-10-CM

## 2022-05-17 DIAGNOSIS — O26892 Other specified pregnancy related conditions, second trimester: Secondary | ICD-10-CM | POA: Diagnosis present

## 2022-05-17 DIAGNOSIS — A599 Trichomoniasis, unspecified: Secondary | ICD-10-CM | POA: Diagnosis not present

## 2022-05-17 DIAGNOSIS — O42912 Preterm premature rupture of membranes, unspecified as to length of time between rupture and onset of labor, second trimester: Secondary | ICD-10-CM | POA: Diagnosis not present

## 2022-05-17 DIAGNOSIS — Z6791 Unspecified blood type, Rh negative: Secondary | ICD-10-CM | POA: Diagnosis not present

## 2022-05-17 DIAGNOSIS — O281 Abnormal biochemical finding on antenatal screening of mother: Secondary | ICD-10-CM | POA: Diagnosis not present

## 2022-05-17 DIAGNOSIS — R772 Abnormality of alphafetoprotein: Secondary | ICD-10-CM | POA: Diagnosis not present

## 2022-05-17 DIAGNOSIS — O4102X0 Oligohydramnios, second trimester, not applicable or unspecified: Secondary | ICD-10-CM | POA: Diagnosis not present

## 2022-05-17 DIAGNOSIS — O283 Abnormal ultrasonic finding on antenatal screening of mother: Secondary | ICD-10-CM | POA: Diagnosis present

## 2022-05-17 DIAGNOSIS — O98512 Other viral diseases complicating pregnancy, second trimester: Secondary | ICD-10-CM | POA: Diagnosis not present

## 2022-05-17 DIAGNOSIS — O4100X0 Oligohydramnios, unspecified trimester, not applicable or unspecified: Secondary | ICD-10-CM | POA: Diagnosis not present

## 2022-05-17 DIAGNOSIS — O34211 Maternal care for low transverse scar from previous cesarean delivery: Secondary | ICD-10-CM | POA: Diagnosis present

## 2022-05-17 DIAGNOSIS — O99012 Anemia complicating pregnancy, second trimester: Secondary | ICD-10-CM | POA: Diagnosis present

## 2022-05-17 DIAGNOSIS — A5901 Trichomonal vulvovaginitis: Secondary | ICD-10-CM | POA: Diagnosis present

## 2022-05-17 DIAGNOSIS — D573 Sickle-cell trait: Secondary | ICD-10-CM | POA: Diagnosis present

## 2022-05-17 DIAGNOSIS — O99212 Obesity complicating pregnancy, second trimester: Secondary | ICD-10-CM

## 2022-05-17 DIAGNOSIS — E669 Obesity, unspecified: Secondary | ICD-10-CM

## 2022-05-17 DIAGNOSIS — O98212 Gonorrhea complicating pregnancy, second trimester: Secondary | ICD-10-CM | POA: Diagnosis present

## 2022-05-17 DIAGNOSIS — G40909 Epilepsy, unspecified, not intractable, without status epilepticus: Secondary | ICD-10-CM

## 2022-05-17 DIAGNOSIS — J45909 Unspecified asthma, uncomplicated: Secondary | ICD-10-CM

## 2022-05-17 DIAGNOSIS — F17210 Nicotine dependence, cigarettes, uncomplicated: Secondary | ICD-10-CM | POA: Diagnosis present

## 2022-05-17 DIAGNOSIS — O99353 Diseases of the nervous system complicating pregnancy, third trimester: Secondary | ICD-10-CM

## 2022-05-17 DIAGNOSIS — O42919 Preterm premature rupture of membranes, unspecified as to length of time between rupture and onset of labor, unspecified trimester: Secondary | ICD-10-CM | POA: Diagnosis not present

## 2022-05-17 DIAGNOSIS — B009 Herpesviral infection, unspecified: Secondary | ICD-10-CM

## 2022-05-17 DIAGNOSIS — O99332 Smoking (tobacco) complicating pregnancy, second trimester: Secondary | ICD-10-CM | POA: Diagnosis present

## 2022-05-17 DIAGNOSIS — Z3009 Encounter for other general counseling and advice on contraception: Secondary | ICD-10-CM | POA: Diagnosis not present

## 2022-05-17 DIAGNOSIS — Z3A23 23 weeks gestation of pregnancy: Secondary | ICD-10-CM

## 2022-05-17 DIAGNOSIS — O34219 Maternal care for unspecified type scar from previous cesarean delivery: Secondary | ICD-10-CM

## 2022-05-17 DIAGNOSIS — Z98891 History of uterine scar from previous surgery: Secondary | ICD-10-CM | POA: Diagnosis not present

## 2022-05-17 DIAGNOSIS — Z5329 Procedure and treatment not carried out because of patient's decision for other reasons: Secondary | ICD-10-CM | POA: Diagnosis not present

## 2022-05-17 DIAGNOSIS — O98312 Other infections with a predominantly sexual mode of transmission complicating pregnancy, second trimester: Secondary | ICD-10-CM | POA: Diagnosis present

## 2022-05-17 DIAGNOSIS — O280 Abnormal hematological finding on antenatal screening of mother: Secondary | ICD-10-CM | POA: Diagnosis not present

## 2022-05-17 DIAGNOSIS — O26899 Other specified pregnancy related conditions, unspecified trimester: Secondary | ICD-10-CM

## 2022-05-17 DIAGNOSIS — O9882 Other maternal infectious and parasitic diseases complicating childbirth: Secondary | ICD-10-CM

## 2022-05-17 DIAGNOSIS — O360120 Maternal care for anti-D [Rh] antibodies, second trimester, not applicable or unspecified: Secondary | ICD-10-CM

## 2022-05-17 DIAGNOSIS — O99512 Diseases of the respiratory system complicating pregnancy, second trimester: Secondary | ICD-10-CM

## 2022-05-17 DIAGNOSIS — Z3A24 24 weeks gestation of pregnancy: Secondary | ICD-10-CM | POA: Diagnosis not present

## 2022-05-17 DIAGNOSIS — Z0371 Encounter for suspected problem with amniotic cavity and membrane ruled out: Secondary | ICD-10-CM | POA: Diagnosis not present

## 2022-05-17 DIAGNOSIS — A549 Gonococcal infection, unspecified: Secondary | ICD-10-CM | POA: Diagnosis present

## 2022-05-17 DIAGNOSIS — O23592 Infection of other part of genital tract in pregnancy, second trimester: Secondary | ICD-10-CM | POA: Diagnosis not present

## 2022-05-17 LAB — PANORAMA PRENATAL TEST FULL PANEL:PANORAMA TEST PLUS 5 ADDITIONAL MICRODELETIONS: FETAL FRACTION: 4.1

## 2022-05-17 LAB — WET PREP, GENITAL
Sperm: NONE SEEN
WBC, Wet Prep HPF POC: >=10 — AB (ref <10)
Yeast Wet Prep HPF POC: NONE SEEN

## 2022-05-17 LAB — COMPREHENSIVE METABOLIC PANEL
ALT: 10 U/L (ref 0–44)
AST: 17 U/L (ref 15–41)
Albumin: 2.7 g/dL — ABNORMAL LOW (ref 3.5–5.0)
Alkaline Phosphatase: 76 U/L (ref 38–126)
Anion gap: 6 (ref 5–15)
BUN: 7 mg/dL (ref 6–20)
CO2: 22 mmol/L (ref 22–32)
Calcium: 8.6 mg/dL — ABNORMAL LOW (ref 8.9–10.3)
Chloride: 108 mmol/L (ref 98–111)
Creatinine, Ser: 0.62 mg/dL (ref 0.44–1.00)
GFR, Estimated: >60 mL/min (ref >60)
Glucose, Bld: 86 mg/dL (ref 70–99)
Potassium: 3.7 mmol/L (ref 3.5–5.1)
Sodium: 136 mmol/L (ref 135–145)
Total Bilirubin: 0.1 mg/dL — ABNORMAL LOW (ref 0.3–1.2)
Total Protein: 6.5 g/dL (ref 6.5–8.1)

## 2022-05-17 LAB — URINALYSIS, ROUTINE W REFLEX MICROSCOPIC
Bilirubin Urine: NEGATIVE
Glucose, UA: NEGATIVE mg/dL
Ketones, ur: NEGATIVE mg/dL
Nitrite: NEGATIVE
Protein, ur: NEGATIVE mg/dL
Specific Gravity, Urine: 1.004 — ABNORMAL LOW (ref 1.005–1.030)
pH: 7 (ref 5.0–8.0)

## 2022-05-17 LAB — TYPE AND SCREEN
ABO/RH(D): O NEG
Antibody Screen: NEGATIVE

## 2022-05-17 LAB — HORIZON 2 (CF & SMA)
CYSTIC FIBROSIS: NEGATIVE
REPORT SUMMARY: NEGATIVE
SPINAL MUSCULAR ATROPHY: NEGATIVE

## 2022-05-17 LAB — AMNISURE RUPTURE OF MEMBRANE (ROM) NOT AT ARMC: Amnisure ROM: POSITIVE

## 2022-05-17 MED ORDER — ACETAMINOPHEN 325 MG PO TABS
650.0000 mg | ORAL_TABLET | ORAL | Status: DC | PRN
  Administered 2022-05-18 – 2022-05-21 (×5): 650 mg via ORAL
  Filled 2022-05-17 (×5): qty 2

## 2022-05-17 MED ORDER — SODIUM CHLORIDE 0.9 % IV SOLN: INTRAVENOUS | Status: DC

## 2022-05-17 MED ORDER — CALCIUM CARBONATE ANTACID 500 MG PO CHEW
2.0000 | CHEWABLE_TABLET | ORAL | Status: DC | PRN
  Administered 2022-05-18: 400 mg via ORAL
  Filled 2022-05-17: qty 2

## 2022-05-17 MED ORDER — METRONIDAZOLE 500 MG PO TABS
500.0000 mg | ORAL_TABLET | Freq: Two times a day (BID) | ORAL | Status: DC
  Administered 2022-05-18 – 2022-05-22 (×10): 500 mg via ORAL
  Filled 2022-05-17 (×10): qty 1

## 2022-05-17 MED ORDER — AMOXICILLIN 500 MG PO CAPS
500.0000 mg | ORAL_CAPSULE | Freq: Three times a day (TID) | ORAL | Status: DC
  Administered 2022-05-19 – 2022-05-22 (×8): 500 mg via ORAL
  Filled 2022-05-17 (×8): qty 1

## 2022-05-17 MED ORDER — HYDRALAZINE HCL 10 MG PO TABS
10.0000 mg | ORAL_TABLET | Freq: Three times a day (TID) | ORAL | Status: DC | PRN
  Administered 2022-05-18: 10 mg via ORAL
  Filled 2022-05-17: qty 1

## 2022-05-17 MED ORDER — MAGNESIUM SULFATE BOLUS VIA INFUSION
4.0000 g | Freq: Once | INTRAVENOUS | Status: AC
  Administered 2022-05-18: 4 g via INTRAVENOUS
  Filled 2022-05-17: qty 1000

## 2022-05-17 MED ORDER — ZOLPIDEM TARTRATE 5 MG PO TABS: 5.0000 mg | ORAL_TABLET | Freq: Every evening | ORAL | Status: DC | PRN

## 2022-05-17 MED ORDER — DOCUSATE SODIUM 100 MG PO CAPS: 100.0000 mg | ORAL_CAPSULE | Freq: Every day | ORAL | Status: DC

## 2022-05-17 MED ORDER — BETAMETHASONE SOD PHOS & ACET 6 (3-3) MG/ML IJ SUSP
12.0000 mg | INTRAMUSCULAR | Status: AC
  Administered 2022-05-17 – 2022-05-18 (×2): 12 mg via INTRAMUSCULAR
  Filled 2022-05-17: qty 5

## 2022-05-17 MED ORDER — IBUPROFEN 600 MG PO TABS
600.0000 mg | ORAL_TABLET | Freq: Once | ORAL | Status: AC
  Administered 2022-05-17: 600 mg via ORAL
  Filled 2022-05-17: qty 1

## 2022-05-17 MED ORDER — PRENATAL MULTIVITAMIN CH
1.0000 | ORAL_TABLET | Freq: Every day | ORAL | Status: DC
  Administered 2022-05-18 – 2022-05-22 (×5): 1 via ORAL
  Filled 2022-05-17 (×5): qty 1

## 2022-05-17 MED ORDER — SODIUM CHLORIDE 0.9 % IV SOLN
2.0000 g | Freq: Four times a day (QID) | INTRAVENOUS | Status: AC
  Administered 2022-05-18 – 2022-05-19 (×8): 2 g via INTRAVENOUS
  Filled 2022-05-17 (×8): qty 2000

## 2022-05-17 MED ORDER — MAGNESIUM SULFATE 40 GM/1000ML IV SOLN
2.0000 g/h | INTRAVENOUS | Status: DC
  Administered 2022-05-18: 2 g/h via INTRAVENOUS
  Filled 2022-05-17: qty 1000

## 2022-05-17 MED ORDER — VALACYCLOVIR HCL 500 MG PO TABS
500.0000 mg | ORAL_TABLET | Freq: Two times a day (BID) | ORAL | Status: DC
  Administered 2022-05-18 – 2022-05-22 (×10): 500 mg via ORAL
  Filled 2022-05-17 (×10): qty 1

## 2022-05-17 NOTE — MAU Provider Note (Signed)
Chief Complaint:  Contractions and Vaginal Bleeding   Event Date/Time   First Provider Initiated Contact with Patient 05/17/22 2108     HPI  HPI: Melissa [name] is a 21 y.o. [id number] at 57w4dho presents to maternity admissions reporting cramping, leaking and bleeding for 2-3 weeks.  Poor historian.  States intially bleeding has been continuous, later states it was just twice post-coitally.  Initially states last IC was 2 weeks ago, now states it was also last week.  States has to urinate frequently and sees blood daily.  Sometimes requires a pad sometimes not.  Had an [place] weeks ago which showed [place] and abnormal AFP States was not leaking then. . She reports good fetal movement, denies h/a, dizziness, n/v, diarrhea, constipation or fever.   States has some chills..  RN note Melissa RealiKellam-Wallace is a 21y.o. at 233w4dere in MAU reporting: contractions for a week that are now every 5-10 minutes, has pushed off coming to MAU because she believed they were only braxton hicks contractions.  Vaginal bleeding that began 2 weeks, stopped and it has been on and off this past week. Now the bleeding is like a period.  Is unsure if her water had broken, reports that she keeps peeing a lot and has had leaking in between going to pee.  +FM  Past Medical History: Past Medical History:  Diagnosis Date   Anemia    Anxiety    Asthma    inhaler. last attack June 2019   Congenital hydronephrosis 2001   Constipation    Depression    Episodic tension-type headache, not intractable 04/16/2015   Family history of adverse reaction to anesthesia    mom rushed to hospital from Dentist office   Genital herpes    HSV infection 08/11/2021   CONCERN for primary outbreak at 3543w6dsee 12/20 office note    Low back strain, sequela 11/10/2020   Migraine without aura and without status migrainosus, not intractable 04/16/2015   PID (pelvic inflammatory disease)    Reactive airway disease in  pediatric patient    Rh negative state in antepartum period 02/05/2019   Seizures ([place]  last one in 2015 - on meds   [place]  Status post primary low transverse cesarean section 08/31/2021   T[place]traumatic [place]) ([place]    Past obstetric history: OB History  Gravida Para Term Preterm AB Living  '4 2 2   1 2  ' SAB IAB Ectopic Multiple Live Births        0 2    # Outcome Date GA Lbr Len/2nd Weight Sex Delivery Anes PTL Lv  4 Current           3 Term 08/31/21 39w47w0d40 g F CS-LTranv Spinal  LIV  2 AB 08/17/20 14w28w2dAB     1 Term 08/20/19 53w5d33w5d / 00:10 3249 g F Vag-Spont EPI  LIV    Past Surgical History: Past Surgical History:  Procedure Laterality Date   APPENDECTOMY     CESAREAN SECTION N/A 08/31/2021   Procedure: CESAREAN SECTION;  Surgeon: Wouk, Gwynne Edinger Location: [facility]S;  Service: Obstetrics;  Laterality: N/A;   HERNIA REPAIR     INTESTINAL MALROTATION REPAIR  2001    Family History: Family History  Problem Relation Age of Onset   Depression Mother    Stroke Mother    Obesity Mother  Post-traumatic stress disorder Mother    Anxiety disorder Mother    Hypertension Mother    Diabetes Mother    Schizophrenia Father    Kidney disease Father     Social History: Social History   Tobacco Use   Smoking status: Every Day    Packs/day: 0.25    Types: Cigarettes   Smokeless tobacco: Never   Tobacco comments:    black and milds, 2 cigs daily  Vaping Use   Vaping Use: Some days  Substance Use Topics   Alcohol use: Not Currently    Comment: occasionally, prior to pregnancy   Drug use: Not Currently    Types: Marijuana    Comment: last 2020    Allergies:  Allergies  Allergen Reactions   Benadryl [Diphenhydramine Hcl] Other (See Comments)    Causes seizures    Gluten Meal Nausea And Vomiting   Shellfish Allergy Anaphylaxis    Patient states her throat gets tight   Zithromax [Azithromycin] Anaphylaxis  and Swelling   Haldol [Haloperidol] Swelling    Tongue swelling.   Lactose Intolerance (Gi) Diarrhea   Latex Swelling    Labial edema after usage of latex condoms.     Meds:  Medications Prior to Admission  Medication Sig Dispense Refill Last Dose   ferrous sulfate 325 (65 FE) MG tablet Take 1 tablet (325 mg total) by mouth at bedtime. 30 tablet 3 05/16/2022   hydrOXYzine (ATARAX) 25 MG tablet Take 1 tablet (25 mg total) by mouth 3 (three) times daily as needed for anxiety. 30 tablet 2 05/16/2022   Prenatal Vit-Fe Fumarate-FA (PREPLUS) 27-1 MG TABS Take 1 tablet by mouth daily. 30 tablet 13 05/16/2022   valACYclovir (VALTREX) 1000 MG tablet Take 1 tablet (1,000 mg total) by mouth 2 (two) times daily. 30 tablet 5 05/17/2022   Blood Pressure Monitoring (BLOOD PRESSURE KIT) DEVI 1 kit by Does not apply route once a week. 1 each 0    hydrOXYzine (ATARAX) 25 MG tablet Take 1 tablet (25 mg total) by mouth 3 (three) times daily as needed for anxiety. 75 tablet 1    Misc. Devices (GOJJI WEIGHT SCALE) MISC 1 Device by Does not apply route every 30 (thirty) days. 1 each 0    terconazole (TERAZOL 7) 0.4 % vaginal cream Place 1 applicator vaginally at bedtime. 45 g 0     I have reviewed patient's Past Medical Hx, Surgical Hx, Family Hx, Social Hx, medications and allergies.   ROS:  Review of Systems Other systems negative  Physical Exam  Patient Vitals for the past 24 hrs:  BP Temp Temp src Pulse Resp SpO2 Height Weight  05/17/22 2027 100/66 -- -- 88 -- 100 % -- --  05/17/22 2006 113/73 98.9 F (37.2 C) Oral (!) 103 18 100 % '5\' 6"'  (1.676 m) 104.6 kg   Constitutional: Well-developed, well-nourished female in no acute distress.  Cardiovascular: normal rate and rhythm Respiratory: normal effort, clear to auscultation bilaterally GI: Abd soft, non-tender, gravid appropriate for gestational age.   No rebound or guarding. MS: Extremities nontender, no edema, normal ROM Neurologic: Alert and oriented  x 4.  GU: Neg CVAT.  PELVIC EXAM: Cervix pink, visually closed, without lesion, scant white creamy discharge, vaginal walls and external genitalia normal Bimanual exam: Cervix firm, posterior, neg CMT, uterus nontender, Fundal Height consistent with dates, adnexa without tenderness, enlargement, or mass     FHT:  Baseline *** , moderate variability, accelerations present, no decelerations Contractions: q *** mins  Irregular  Rare   Labs: Results for orders placed or performed during the hospital encounter of 05/17/22 (from the past 24 hour(s))  Urinalysis, Routine w reflex microscopic Urine, Clean Catch     Status: Abnormal   Collection Time: 05/17/22  8:10 PM  Result Value Ref Range   Color, Urine STRAW (A) YELLOW   APPearance CLEAR CLEAR   Specific Gravity, Urine 1.004 (L) 1.005 - 1.030   pH 7.0 5.0 - 8.0   Glucose, UA NEGATIVE NEGATIVE mg/dL   Hgb urine dipstick LARGE (A) NEGATIVE   Bilirubin Urine NEGATIVE NEGATIVE   Ketones, ur NEGATIVE NEGATIVE mg/dL   Protein, ur NEGATIVE NEGATIVE mg/dL   Nitrite NEGATIVE NEGATIVE   Leukocytes,Ua SMALL (A) NEGATIVE   RBC / HPF 6-10 0 - 5 RBC/hpf   WBC, UA 11-20 0 - 5 WBC/hpf   Bacteria, UA FEW (A) NONE SEEN   Squamous Epithelial / LPF 0-5 0 - 5   Mucus PRESENT    O/Negative/-- (08/18 1149)  Imaging:  [place] MFM OB DETAIL +14 WK  Result Date: 05/03/2022 ----------------------------------------------------------------------  OBSTETRICS REPORT                       (Signed Final 05/03/2022 03:44 pm) ---------------------------------------------------------------------- Patient Info  ID #:       [id number]                          D.O.B.:  26-Nov-2000 (21 yrs)  Name:       Read Drivers-                Visit Date: 05/03/2022 12:48 pm              WALLACE ---------------------------------------------------------------------- Performed By  Attending:        Tama High MD        Ref. Address:     [place]                                                             [place]  Performed By:     Dorena Dew     [facility]                                                             [place] [facility]                                                             [facility]  Referred By:      [facility] ---------------------------------------------------------------------- Orders  #  Description                           Code        Ordered By  1  [place] MFM OB DETAIL +14 WK               76811.01    Arlina Robes ----------------------------------------------------------------------  #  Order #                     Accession #                Episode #  1  [id number]                   [id number]                 [id number] ---------------------------------------------------------------------- Indications  Elevated MSAFP (MoM 4.1)                       O28.9  [redacted] weeks gestation of pregnancy                Z3A.21  History of sickle cell trait (GC 05-12-21)      Z86.2  Rh negative state in antepartum                O36.0190  Previous cesarean delivery, antepartum         O34.219  Seizure disorder (Last one 2015-on meds)       O99.350 G40.909  Herpes simplex virus (HSV)                     O98.519 B00.9  Asthma                                         O99.89 j45.909  Short interval between pregancies, 2nd         O09.892  trimester  Obesity complicating pregnancy, second         O99.212  trimester ---------------------------------------------------------------------- Fetal Evaluation  Num Of Fetuses:         1  Fetal Heart Rate(bpm):  157  Cardiac Activity:  Observed  Presentation:           Cephalic  Placenta:               Anterior  P. Cord Insertion:      Visualized,  central  Amniotic Fluid  AFI FV:      Decreased                              Largest Pocket(cm)                              2.2 ---------------------------------------------------------------------- Biometry  BPD:     52.19  mm     G. Age:  21w 6d         59  %    CI:        72.83   %    70 - 86                                                          FL/HC:      19.7   %    18.4 - 20.2  HC:    194.44   mm     G. Age:  21w 5d         43  %    HC/AC:      1.12        1.06 - 1.25  AC:    173.48   mm     G. Age:  22w 2d         66  %    FL/BPD:     73.3   %    71 - 87  FL:      38.23  mm     G. Age:  22w 2d         64  %    FL/AC:      22.0   %    20 - 24  HUM:      34.7  mm     G. Age:  21w 6d         56  %  CER:      24.1  mm     G. Age:  22w 1d         84  %  NFT:       6.3  mm  LV:        4.7  mm  CM:        7.3  mm  Est. FW:     483  gm      1 lb 1 oz     76  % ---------------------------------------------------------------------- OB History  Gravidity:    4         Term:   2        Prem:   0        SAB:   1  TOP:          0       Ectopic:  0        Living: 2 ---------------------------------------------------------------------- Gestational Age  LMP:           21w 6d  Date:  12/01/21                  EDD:   09/07/22  Clinical EDD:  21w 4d                                        EDD:   09/09/22  U/S Today:     22w 0d                                        EDD:   09/06/22  Best:          21w 4d     Det. By:  Clinical EDD             EDD:   09/09/22 ---------------------------------------------------------------------- Anatomy  Cranium:               Appears normal         Aortic Arch:            Appears normal  Cavum:                 Appears normal         Ductal Arch:            Not well visualized  Ventricles:            Appears normal         Diaphragm:              Appears normal  Choroid Plexus:        Appears normal         Stomach:                Appears normal, left                                                                         sided  Cerebellum:            Appears normal         Abdomen:                Appears normal  Posterior Fossa:       Appears normal         Abdominal Wall:         Appears nml (cord                                                                        insert, abd wall)  Face:                  Orbits appear          Cord Vessels:           Appears normal (3  normal                                         vessel cord)  Lips:                  Appears normal         Kidneys:                Appear normal  Palate:                Not well visualized    Bladder:                Appears normal  Thoracic:              Appears normal         Spine:                  Limited views                                                                        appear normal  Heart:                 Not well visualized    Upper Extremities:      Not well visualized  RVOT:                  Appears normal         Lower Extremities:      Not well visualized  LVOT:                  Appears normal  Other:  Gender not well visualized. Technically difficult due to fetal position          and low fluid. ---------------------------------------------------------------------- Cervix Uterus Adnexa  Cervix  Length:            2.6  cm.  Normal appearance by transabdominal scan.  Right Ovary  Size(cm)     3.08   x   2.65   x  1.93      Vol(ml): 8.25  Within normal limits.  Left Ovary  Not visualized. ---------------------------------------------------------------------- Impression  We performed a fetal anatomical survey.  Amniotic fluid is  considerably decreased and the maximum vertical pocket  measures 2 cm.  Fetal biometry is consistent with the  previously established dates.  No markers of aneuploidies or  obvious fetal structural defects are seen.  Intracranial  structures and abdominal wall appear normal.  The spine  appears normal but limited evaluation because of low  amniotic fluid.  Placenta  appears normal and there is no  evidence of previa or placenta accreta spectrum.  xxxxxxxxxxxxxxxxxxxxxxxxxxxxxxxxxxxxxxxxxxxxxxxxx  Consultation (see EPIC )  I had the pleasure of seeing Ms. Hollinshead [facility] at [facility]. She is G4 P2012 at 21w 4d  gestation and is here for fetal anatomy scan and genetic  counseling.  Increased MSAFP was seen on screening (4.1 MoM) and the  risk for open neural tube defect was increased at 1 and 45.  Patient does not give history of  vaginal bleeding.  On cell-free fetal Linea screening, the risks of fetal  aneuploidies are not increased.  Obstetric history  -2020: Term vaginal delivery of a female infant weighing 7  pounds and 2 ounces at birth.  Her daughter is in good health.  -08/2021: Term cesarean delivery (genital herpes) of a  female infant weighing 8 pounds at birth.  Her daughter is in  good health.  She had 1 spontaneous miscarriage in 2022.  GYN history: No history of abnormal Pap smears or cervical  surgeries.  Past medical history: No history of hypertension or diabetes  or any chronic medical conditions.  She has sickle cell trait  and her partner's carrier status is not known.  Past surgical history: Cesarean section, appendectomy,  intestinal malrotation repair.  Allergies: Benadryl (seizures), shellfish, Zithromax, Haldol,  latex (see epic for details).  Social history: Denies drug or alcohol use.  She reports she  smokes 1 cigarette daily.  Her partner is African-American  and he is not involved in this pregnancy.  Family history: No history of venous thromboembolism in the  family.  Increased risk for open-neural tube defects and increased  AFP  I reassured the patient of normal fetal anatomy on ultrasound  including spine and intracranial structures. I informed her that  ultrasound can detect up to 95 out of 100 cases of spina  bifida and that amniocentesis will only marginally improve the  detection rate.  Increased AFP can also be  associated with fetal growth  restriction (placental insufficiency) and preterm delivery. Low  amniotic fluid is associated with placental insufficiency and  stillbirth is associated with increased AFP in some cases.  I recommended serial fetal growth assessments.  Patient met with our genetic counselor after ultrasound. See  separate letter from our genetic counselor. ---------------------------------------------------------------------- Recommendations  -An appointment was made for her to return in 3 weeks for  fetal growth and amniotic fluid assessments.  -I advised the patient to call your office if she complains of  decreased fetal movements. ----------------------------------------------------------------------                 Tama High, MD Electronically Signed Final Report   05/03/2022 03:44 pm ----------------------------------------------------------------------   MAU Course/MDM: I have reviewed the triage vital signs and the nursing notes.   Pertinent labs & imaging results that were available during my care of the patient were reviewed by me and considered in my medical decision making (see chart for details).      I have reviewed her medical records including past results, notes and treatments.   I have ordered labs and reviewed results.  NST reviewed Consult *** with presentation, exam findings and test results.  Treatments in MAU included ***.    Assessment: No diagnosis found.  Plan: Discharge home Labor precautions and fetal kick counts Follow up in Office for prenatal visits and recheck   Pt stable at time of discharge.  Hansel Feinstein CNM, MSN Certified Nurse-Midwife 05/17/2022 9:08 PM

## 2022-05-17 NOTE — MAU Note (Signed)
..  Melissa [name] is a 21 y.o. at [redacted]w[redacted]d here in MAU reporting: contractions for a week that are now every 5-10 minutes, has pushed off coming to MAU because she believed they were only braxton hicks contractions.  Vaginal bleeding that began 2 weeks, stopped and it has been on and off this past week. Now the bleeding is like a period.  Is unsure if her water had broken, reports that she keeps peeing a lot and has had leaking in between going to pee.  +FM  Onset of complaint: refer to note Pain score: 7/10 Vitals:   05/17/22 2006  BP: 113/73  Pulse: (!) 103  Resp: 18  Temp: 98.9 F (37.2 C)  SpO2: 100%     FHT:140 Lab orders placed from triage:  UA

## 2022-05-17 NOTE — MAU Note (Signed)
Ultrasound at bedside

## 2022-05-18 ENCOUNTER — Encounter (HOSPITAL_COMMUNITY): Payer: Self-pay | Admitting: Obstetrics & Gynecology

## 2022-05-18 DIAGNOSIS — A599 Trichomoniasis, unspecified: Secondary | ICD-10-CM

## 2022-05-18 DIAGNOSIS — O42919 Preterm premature rupture of membranes, unspecified as to length of time between rupture and onset of labor, unspecified trimester: Secondary | ICD-10-CM | POA: Diagnosis not present

## 2022-05-18 DIAGNOSIS — Z3A23 23 weeks gestation of pregnancy: Secondary | ICD-10-CM | POA: Diagnosis not present

## 2022-05-18 DIAGNOSIS — Z3009 Encounter for other general counseling and advice on contraception: Secondary | ICD-10-CM | POA: Diagnosis not present

## 2022-05-18 DIAGNOSIS — O283 Abnormal ultrasonic finding on antenatal screening of mother: Secondary | ICD-10-CM | POA: Diagnosis present

## 2022-05-18 DIAGNOSIS — B009 Herpesviral infection, unspecified: Secondary | ICD-10-CM | POA: Diagnosis not present

## 2022-05-18 DIAGNOSIS — Z98891 History of uterine scar from previous surgery: Secondary | ICD-10-CM | POA: Diagnosis not present

## 2022-05-18 DIAGNOSIS — O98212 Gonorrhea complicating pregnancy, second trimester: Secondary | ICD-10-CM | POA: Diagnosis present

## 2022-05-18 LAB — CBC
HCT: 32.4 % — ABNORMAL LOW (ref 36.0–46.0)
Hemoglobin: 10.8 g/dL — ABNORMAL LOW (ref 12.0–15.0)
MCH: 25.9 pg — ABNORMAL LOW (ref 26.0–34.0)
MCHC: 33.3 g/dL (ref 30.0–36.0)
MCV: 77.7 fL — ABNORMAL LOW (ref 80.0–100.0)
Platelets: 212 10*3/uL (ref 150–400)
RBC: 4.17 MIL/uL (ref 3.87–5.11)
RDW: 15.4 % (ref 11.5–15.5)
WBC: 9.8 10*3/uL (ref 4.0–10.5)
nRBC: 0 % (ref 0.0–0.2)

## 2022-05-18 LAB — RH IG WORKUP (INCLUDES ABO/RH): Gestational Age(Wks): 23

## 2022-05-18 LAB — GC/CHLAMYDIA PROBE AMP (~~LOC~~) NOT AT ARMC
Chlamydia: NEGATIVE
Comment: NEGATIVE
Comment: NORMAL
Neisseria Gonorrhea: POSITIVE — AB

## 2022-05-18 LAB — RAPID HIV SCREEN (HIV 1/2 AB+AG)
HIV 1/2 Antibodies: NONREACTIVE
HIV-1 P24 Antigen - HIV24: NONREACTIVE

## 2022-05-18 MED ORDER — RHO D IMMUNE GLOBULIN 1500 UNIT/2ML IJ SOSY
300.0000 ug | PREFILLED_SYRINGE | Freq: Once | INTRAMUSCULAR | Status: AC
  Administered 2022-05-18: 300 ug via INTRAVENOUS
  Filled 2022-05-18: qty 2

## 2022-05-18 MED ORDER — POLYETHYLENE GLYCOL 3350 17 G PO PACK
17.0000 g | PACK | Freq: Every day | ORAL | Status: DC
  Administered 2022-05-18 – 2022-05-21 (×4): 17 g via ORAL
  Filled 2022-05-18 (×4): qty 1

## 2022-05-18 MED ORDER — DEXTROSE 5 % IV SOLN
500.0000 mg | Freq: Once | INTRAVENOUS | Status: AC
  Administered 2022-05-18: 500 mg via INTRAVENOUS
  Filled 2022-05-18: qty 500

## 2022-05-18 MED ORDER — MAGNESIUM SULFATE 40 GM/1000ML IV SOLN: 2.0000 g/h | INTRAVENOUS | Status: DC

## 2022-05-18 MED ORDER — SODIUM CHLORIDE 0.9 % IV SOLN
1.0000 g | Freq: Once | INTRAVENOUS | Status: AC
  Administered 2022-05-18: 1 g via INTRAVENOUS
  Filled 2022-05-18: qty 10

## 2022-05-18 MED ORDER — ALUM & MAG HYDROXIDE-SIMETH 200-200-20 MG/5ML PO SUSP
30.0000 mL | Freq: Once | ORAL | Status: DC
  Filled 2022-05-18: qty 30

## 2022-05-18 MED ORDER — HYDROXYZINE HCL 50 MG PO TABS
25.0000 mg | ORAL_TABLET | Freq: Three times a day (TID) | ORAL | Status: DC | PRN
  Administered 2022-05-18 – 2022-05-20 (×3): 25 mg via ORAL
  Filled 2022-05-18 (×3): qty 1

## 2022-05-18 MED ORDER — RHO D IMMUNE GLOBULIN 1500 UNIT/2ML IJ SOSY
300.0000 ug | PREFILLED_SYRINGE | Freq: Once | INTRAMUSCULAR | Status: DC
  Filled 2022-05-18: qty 2

## 2022-05-18 MED ORDER — PANTOPRAZOLE SODIUM 40 MG PO TBEC
40.0000 mg | DELAYED_RELEASE_TABLET | Freq: Every day | ORAL | Status: DC
  Administered 2022-05-19 – 2022-05-22 (×4): 40 mg via ORAL
  Filled 2022-05-18 (×4): qty 1

## 2022-05-18 NOTE — Progress Notes (Signed)
Patient ID: Melissa [name], female   DOB: [birth year], 21 y.o.   MRN: [id number]  Cultures resulted showing Gonorrhea infection  Will give Rocephin 1gm IV per pharmacy consult

## 2022-05-18 NOTE — Consult Note (Signed)
[name]   Prenatal Consult      05/18/22    I was asked by Dr. Ilda Basset to consult on this patient for possible preterm delivery. I had the pleasure of meeting with Melissa [name] today.    She is a 21 yo G100P2012 female presenting at [redacted]w[redacted]d IUP with concerns for PPROM with known oligohydramnios and abnormal AFP. Pregnancy has also been complicated by prior C-section for primary HSV outbreak, postpartum depression and anxiety, HSV2, tobacco use, h/o epilepsy on no meds with last seizure many years ago, mild intellectual disability, +GC and + trich on admission . She is currently receiving BMZ and neuro protective magnesium, first dose 9/28. Most recent estimated fetal weight of 750 grams, female infant.     We discussed the possible needs for an infant born at this gestation. I explained that the neonatal intensive care team would be present for the delivery and outlined the likely delivery room course for this baby including routine resuscitation and NRP-guided approaches to the treatment of respiratory distress. We discussed the need for intubation and mechanical ventilation and surfactant administration for respiratory distress, central line placement, use of blood pressure medications, IV fluids pending establishment of enteral feeds, antibiotics for possible sepsis, temperature support, and monitoring.    We discussed other common problems associated with prematurity including respiratory distress syndrome/CLD, feeding issues, temperature regulation, and infection risk. I also discussed the potential risk of complications such as intracranial hemorrhage, retinopathy, chronic lung disease, and a range of long term developmental delays, and how these risks decrease with each additional week of gestation.   We discussed the importance of good nutrition and various methods of providing nutrition (parenteral hyperalimentation, gavage feedings and/or oral feeding). We  discussed the benefits of human milk. I encouraged breast feeding and pumping soon after birth and outlined resources that are available to support breast feeding. We discussed the possibility of using donor breast milk as a bridge, and she expressed the desire to use DBM if needed to supplement MBM.   We discussed the average length of stay but I noted that the actual LOS would depend on the severity of problems encountered and response to treatments. We discussed visitation policies and the resources available while their child is in the hospital.   She expressed understanding and agreement with the plan for resuscitation and intensive care. All questions were answered. At this time she would like to proceed with resuscitation.   Thank you for involving [place] in the care of this patient. A member of our team will be available should the family have additional questions.    Davonna Belling, MD Neonatal Medicine   I spent ~40 minutes in consultation time, of which 25 minutes was spent in direct face to face counseling.

## 2022-05-18 NOTE — Progress Notes (Signed)
Asked to see patient due to CP. Reports pian with deep inspiration. She also notes h/o GERD and anxiety--has vistaril and will give PPI. Normal respiratory effort and normal O2 sats at 100%.

## 2022-05-18 NOTE — Progress Notes (Signed)
OB Note  Patient doing well and baby is category I and toco quiet. She states that she's doing well.  I informed her about the +GC test, in conjunction to the + trich testing. I told her I recommend she talk to any partners about this and recommend they go to their local HD for free testing and treatment.  I also talked to her about tolac vs repeat c-section. She had a PLTCS in January 2023 for HSV outbreak at term. I d/w her re: risk of uterine rupture particularly with her short interval pregnancy and increased risk of uterine rupture and maternal/fetal m&m with that. She is open to to tolac. I told her I would recommend tolac if she is preterm labor but I would recommend a rpt c/s if she needs delivery and she is not in labor. She is amenable to this plan. She also desires BTL;papers signed this morning with Dr.Ozan and in the chart.   Durene Romans MD Attending [facility] [employer] [facility] (Faculty Practice) 05/18/2022 Time: 1145am

## 2022-05-18 NOTE — H&P (Addendum)
FACULTY PRACTICE ANTEPARTUM ADMISSION HISTORY AND PHYSICAL NOTE   History of Present Illness: Melissa [name] is a 21 y.o. [id number] at 34w5dadmitted for PPROM with known oligohydramnios and abnormal AFP.    She initially presented to [facility] with cramping and bleeding as well as loss of fluid, which appeared to be going on for a few weeks- pt is a poor historian.  Notes mostly bleeding after IC, but states it seemed to be getting worse and that's what brought her in.  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 -tobacco use -h/o epilepsy, no meds, last seizure many years ago -mild intellectual disability -Rh Negative -Sickle cell trait  Patient Active Problem List   Diagnosis Date Noted   Preterm premature rupture of membranes (PPROM) with unknown onset of labor 05/17/2022   Oligohydramnios antepartum 05/11/2022   Abnormal antenatal AFP screen 04/12/2022   History of cesarean section 04/07/2022   Bartholin cyst 04/07/2022   History of ELISA positive for HSV 04/07/2022   Supervision of other normal pregnancy, antepartum 02/08/2022   Post-partum depression 11/17/2021   Post partum depression 11/16/2021   History of primary cesarean section 08/31/2021   Abnormal involuntary movements 08/04/2020   Migraine with aura and without status migrainosus, not intractable 08/12/2019   Seizures ([facility]   Sickle cell trait ([facility]06/25/2020   Rh negative, antepartum 02/05/2019   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   DMDD (disruptive mood dysregulation disorder) (HC[facility] [place]12/08/2015   MDD (major depressive disorder), recurrent severe, without psychosis (HCSusan [place]11/27/2017   Chronic constipation 08/03/2015   Partial epilepsy with impairment of consciousness ([facility]08/26/2016   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     Past Surgical History:  Procedure Laterality Date   APPENDECTOMY     CESAREAN SECTION N/A 08/31/2021   Procedure: CESAREAN SECTION;  Surgeon: WoGwynne EdingerMD;  Location: [facility] LD [facility];  Service: Obstetrics;  Laterality: N/A;   HERNIA REPAIR     INTESTINAL MALROTATION REPAIR  202001-07-23  OB History  Gravida Para Term Preterm AB Living  _0 SAB IAB Ectopic Multiple Live Births        0 2    # Outcome Date GA Lbr Len/2nd Weight Sex Delivery Anes PTL Lv  4 Current           3 Term 08/31/21 394w0d640 g F CS-LTranv Spinal  LIV  2 AB 08/17/20 14w61w2dSAB     1 Term 08/20/19 38w539w5d4 / 00:10 3249 g F Vag-Spont EPI  LIV    Social History   Socioeconomic History   Marital status: Single    Spouse name: Not on file   Number of children: Not on file   Years of education: Not on file   Highest education level: Not on file  Occupational History   Not on file  Tobacco Use   Smoking status: Every Day    Packs/day: 0.25    Types: Cigarettes   Smokeless tobacco: Never   Tobacco comments:    black and milds, 2 cigs daily  Vaping Use   Vaping Use: Some days  Substance and Sexual Activity   Alcohol use: Not Currently    Comment: occasionally, prior to pregnancy   Drug use: Not Currently    Types: Marijuana  Comment: last 2020   Sexual activity: Yes    Partners: Male    Birth control/protection: None  Other Topics Concern   Not on file  Social History Narrative   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   She attended [employer].    She lives with her parents, siblings, and grandfather.    She enjoys writing, singing, dancing, cooking, and shopping.   She attends [employer].   Social Determinants of Health   Financial Resource Strain: Not on file  Food Insecurity: Not on file  Transportation Needs: Not on file  Physical Activity: Not on file  Stress: Not on file  Social Connections: Not on file    Family History  Problem Relation Age of Onset    Depression Mother    Stroke Mother    Obesity Mother    Post-traumatic stress disorder Mother    Anxiety disorder Mother    Hypertension Mother    Diabetes Mother    Schizophrenia Father    Kidney disease Father     Allergies  Allergen Reactions   Benadryl [Diphenhydramine Hcl] Other (See Comments)    Causes seizures    Gluten Meal Nausea And Vomiting   Shellfish Allergy Anaphylaxis    Patient states her throat gets tight   Zithromax [Azithromycin] Anaphylaxis and Swelling   Haldol [Haloperidol] Swelling    Tongue swelling.   Lactose Intolerance (Gi) Diarrhea   Latex Swelling    Labial edema after usage of latex condoms.     Medications Prior to Admission  Medication Sig Dispense Refill Last Dose   ferrous sulfate 325 (65 FE) MG tablet Take 1 tablet (325 mg total) by mouth at bedtime. 30 tablet 3 05/16/2022   hydrOXYzine (ATARAX) 25 MG tablet Take 1 tablet (25 mg total) by mouth 3 (three) times daily as needed for anxiety. 30 tablet 2 05/16/2022   Prenatal Vit-Fe Fumarate-FA (PREPLUS) 27-1 MG TABS Take 1 tablet by mouth daily. 30 tablet 13 05/16/2022   valACYclovir (VALTREX) 1000 MG tablet Take 1 tablet (1,000 mg total) by mouth 2 (two) times daily. 30 tablet 5 05/17/2022   Blood Pressure Monitoring (BLOOD PRESSURE KIT) DEVI 1 kit by Does not apply route once a week. 1 each 0    hydrOXYzine (ATARAX) 25 MG tablet Take 1 tablet (25 mg total) by mouth 3 (three) times daily as needed for anxiety. 75 tablet 1    Misc. Devices (GOJJI WEIGHT SCALE) MISC 1 Device by Does not apply route every 30 (thirty) days. 1 each 0    terconazole (TERAZOL 7) 0.4 % vaginal cream Place 1 applicator vaginally at bedtime. 45 g 0     Review of Systems - Negative except vaginal bleeding.  Denies fatigue, headache, change in vision. Denies cough/sore throat.  Denies CP/SOB.  Denies nausea/vomiting.  Denies change in BMs.  Some bleeding with urination.  Denies musculoskeletal aches.  +anxiety  Vitals:  BP  112/60 (BP Location: Right Arm) Comment: recheck  Pulse 85   Temp 98.1 F (36.7 C) (Axillary)   Resp 17   Ht 5' 6" (1.676 m)   Wt 104.6 kg   LMP 12/03/2021   SpO2 100%   BMI 37.20 kg/m  Physical Examination: CONSTITUTIONAL: Well-developed, well-nourished female in no acute distress.  HENT:  Normocephalic, atraumatic EYES: Conjunctivae and EOM are normal. Pupils are equal, round, and reactive to light. No scleral icterus.  NECK: Normal range of motion, supple, no masses SKIN: Skin is warm and dry.  No rash noted. Not diaphoretic. No erythema. No pallor. [place]: Alert and oriented to person, place, and time. PSYCHIATRIC: Normal mood and affect. Normal behavior CARDIOVASCULAR: Normal heart rate noted, regular rhythm RESPIRATORY: Effort and breath sounds normal, no problems with respiration noted ABDOMEN: Soft, nontender, nondistended, gravid. GU: deferred, completed in [facility] MUSCULOSKELETAL: No edema, no calf tenderness bilaterally  Fetal Monitoring:Baseline: 135 bpm, Variability: moderate variability, Accelerations: +15x15 accels, and Decelerations: Absent Tocometer: Flat  Labs:  Results for orders placed or performed during the hospital encounter of 05/17/22 (from the past 24 hour(s))  Urinalysis, Routine w reflex microscopic Urine, Clean Catch   Collection Time: 05/17/22  8:10 PM  Result Value Ref Range   Color, Urine STRAW (A) YELLOW   APPearance CLEAR CLEAR   Specific Gravity, Urine 1.004 (L) 1.005 - 1.030   pH 7.0 5.0 - 8.0   Glucose, UA NEGATIVE NEGATIVE mg/dL   Hgb urine dipstick LARGE (A) NEGATIVE   Bilirubin Urine NEGATIVE NEGATIVE   Ketones, ur NEGATIVE NEGATIVE mg/dL   Protein, ur NEGATIVE NEGATIVE mg/dL   Nitrite NEGATIVE NEGATIVE   Leukocytes,Ua SMALL (A) NEGATIVE   RBC / HPF 6-10 0 - 5 RBC/hpf   WBC, UA 11-20 0 - 5 WBC/hpf   Bacteria, UA FEW (A) NONE SEEN   Squamous Epithelial / LPF 0-5 0 - 5   Mucus PRESENT   Wet prep, genital   Collection Time: 05/17/22   9:05 PM  Result Value Ref Range   Yeast Wet Prep HPF POC NONE SEEN NONE SEEN   Trich, Wet Prep PRESENT (A) NONE SEEN   Clue Cells Wet Prep HPF POC PRESENT (A) NONE SEEN   WBC, Wet Prep HPF POC >=10 (A) <10   Sperm NONE SEEN   Amnisure rupture of membrane (rom)not at [facility]   Collection Time: 05/17/22  9:05 PM  Result Value Ref Range   Amnisure ROM POSITIVE   Comprehensive metabolic panel   Collection Time: 05/17/22 10:22 PM  Result Value Ref Range   Sodium 136 135 - 145 mmol/L   Potassium 3.7 3.5 - 5.1 mmol/L   Chloride 108 98 - 111 mmol/L   CO2 22 22 - 32 mmol/L   Glucose, Bld 86 70 - 99 mg/dL   BUN 7 6 - 20 mg/dL   Creatinine, Ser 0.62 0.44 - 1.00 mg/dL   Calcium 8.6 (L) 8.9 - 10.3 mg/dL   Total Protein 6.5 6.5 - 8.1 g/dL   Albumin 2.7 (L) 3.5 - 5.0 g/dL   AST 17 15 - 41 U/L   ALT 10 0 - 44 U/L   Alkaline Phosphatase 76 38 - 126 U/L   Total Bilirubin 0.1 (L) 0.3 - 1.2 mg/dL   GFR, Estimated >60 >60 mL/min   Anion gap 6 5 - 15  Type and screen [place]   Collection Time: 05/17/22 10:22 PM  Result Value Ref Range   ABO/RH(D) O NEG    Antibody Screen NEG    Sample Expiration      05/20/2022,2359 Performed at [facility], [address] [place], [place] [address]     Imaging Studies: [place] MFM OB DETAIL +14 WK  Result Date: 05/03/2022 ----------------------------------------------------------------------  OBSTETRICS REPORT                       (Signed Final 05/03/2022 03:44 pm) ---------------------------------------------------------------------- Patient Info  ID #:       [id number]  D.O.B.:  June 06, 2001 (21 yrs)  Name:       CHAVON LUCARELLI [facility]-                Visit Date: 05/03/2022 12:48 pm              W[place]ACE ---------------------------------------------------------------------- Performed By  Attending:        Tama High MD        Ref. Address:     [place] [address] [place]                                                             [place]  Performed By:     Dorena Dew     Location:         [facility]                    BS, RDMS                                 [facility]                                                             [place] [facility]                                                             [facility]  Referred By:      [place] ---------------------------------------------------------------------- Orders  #  Description                           Code        Ordered By  1  [place] MFM OB DETAIL +14 [place]  24097.35    Arlina Robes ----------------------------------------------------------------------  #  Order #                     Accession #                Episode #  1  [id number]                   [id number]                 [id number] ---------------------------------------------------------------------- Indications  Elevated MSAFP (MoM 4.1)                       O28.9  [redacted] weeks gestation of pregnancy                Z3A.21  History of sickle cell trait (GC 05-12-21)      Z86.2  Rh negative state in antepartum                O36.0190  Previous cesarean delivery, antepartum         O34.219  Seizure disorder (Last one 2015-on meds)       O99.350 G40.909  Herpes simplex virus (HSV)                     O98.519 B00.9  Asthma                                         O99.89 j45.909  Short interval between pregancies, 2nd         O09.892  trimester  Obesity complicating pregnancy, second         O99.212  trimester ---------------------------------------------------------------------- Fetal Evaluation  Num Of Fetuses:         1  Fetal Heart Rate(bpm):  157  Cardiac Activity:       Observed  Presentation:           Cephalic  Placenta:               Anterior  P. Cord Insertion:      Visualized, central  Amniotic Fluid  AFI FV:       Decreased                              Largest Pocket(cm)                              2.2 ---------------------------------------------------------------------- Biometry  BPD:     52.19  mm     G. Age:  21w 6d         59  %    CI:        72.83   %    70 - 86                                                          FL/HC:      19.7   %    18.4 - 20.2  HC:    194.44   mm     G. Age:  21w 5d         43  %    HC/AC:      1.12        1.06 - 1.25  AC:    173.48   mm     G. Age:  22w 2d         66  %    FL/BPD:     73.3   %    71 - 87  FL:      38.23  mm     G. Age:  22w 2d         64  %    FL/AC:      22.0   %    20 - 24  HUM:      34.7  mm     G. Age:  21w 6d         56  %  CER:      24.1  mm     G. Age:  22w 1d         84  %  NFT:       6.3  mm  LV:        4.7  mm  CM:        7.3  mm  Est. FW:     483  gm      1 lb 1 oz     76  % ---------------------------------------------------------------------- OB History  Gravidity:    4         Term:   2        Prem:   0        SAB:   1  TOP:          0       Ectopic:  0        Living: 2 ---------------------------------------------------------------------- Gestational Age  LMP:           21w 6d        Date:  12/01/21                  EDD:   09/07/22  Clinical EDD:  33A 4d                                        EDD:   09/09/22  U/S Today:     22w 0d                                        EDD:   09/06/22  Best:          21w 4d     Det. By:  Clinical EDD             EDD:   09/09/22 ---------------------------------------------------------------------- Anatomy  Cranium:               Appears normal         Aortic Arch:            Appears normal  Cavum:                 Appears normal         Ductal Arch:            Not well visualized  Ventricles:  Appears normal         Diaphragm:              Appears normal  Choroid Plexus:        Appears normal         Stomach:                Appears normal, left                                                                         sided  Cerebellum:            Appears normal         Abdomen:                Appears normal  Posterior Fossa:       Appears normal         Abdominal Wall:         Appears nml (cord                                                                        insert, abd wall)  Face:                  Orbits appear          Cord Vessels:           Appears normal (3                         normal                                         vessel cord)  Lips:                  Appears normal         Kidneys:                Appear normal  Palate:                Not well visualized    Bladder:                Appears normal  Thoracic:              Appears normal         Spine:                  Limited views  appear normal  Heart:                 Not well visualized    Upper Extremities:      Not well visualized  RVOT:                  Appears normal         Lower Extremities:      Not well visualized  LVOT:                  Appears normal  Other:  Gender not well visualized. Technically difficult due to fetal position          and low fluid. ---------------------------------------------------------------------- Cervix Uterus Adnexa  Cervix  Length:            2.6  cm.  Normal appearance by transabdominal scan.  Right Ovary  Size(cm)     3.08   x   2.65   x  1.93      Vol(ml): 8.25  Within normal limits.  Left Ovary  Not visualized. ---------------------------------------------------------------------- Impression  We performed a fetal anatomical survey.  Amniotic fluid is  considerably decreased and the maximum vertical pocket  measures 2 cm.  Fetal biometry is consistent with the  previously established dates.  No markers of aneuploidies or  obvious fetal structural defects are seen.  Intracranial  structures and abdominal wall appear normal.  The spine  appears normal but limited evaluation because of low  amniotic fluid.  Placenta appears normal and there is no   evidence of previa or placenta accreta spectrum.  xxxxxxxxxxxxxxxxxxxxxxxxxxxxxxxxxxxxxxxxxxxxxxxxx  Consultation (see EPIC )  I had the pleasure of seeing Ms. Kalina [facility]. She is G4 P2012 at 21w 4d  gestation and is here for fetal anatomy scan and genetic  counseling.  Increased MSAFP was seen on screening (4.1 MoM) and the  risk for open neural tube defect was increased at 1 and 45.  Patient does not give history of vaginal bleeding.  On cell-free fetal Linea screening, the risks of fetal  aneuploidies are not increased.  Obstetric history  -2020: Term vaginal delivery of a female infant weighing 7  pounds and 2 ounces at birth.  Her daughter is in good health.  -08/2021: Term cesarean delivery (genital herpes) of a  female infant weighing 8 pounds at birth.  Her daughter is in  good health.  She had 1 spontaneous miscarriage in 2022.  GYN history: No history of abnormal Pap smears or cervical  surgeries.  Past medical history: No history of hypertension or diabetes  or any chronic medical conditions.  She has sickle cell trait  and her partner's carrier status is not known.  Past surgical history: Cesarean section, appendectomy,  intestinal malrotation repair.  Allergies: Benadryl (seizures), shellfish, Zithromax, Haldol,  latex (see epic for details).  Social history: Denies drug or alcohol use.  She reports she  smokes 1 cigarette daily.  Her partner is African-American  and he is not involved in this pregnancy.  Family history: No history of venous thromboembolism in the  family.  Increased risk for open-neural tube defects and increased  AFP  I reassured the patient of normal fetal anatomy on ultrasound  including spine and intracranial structures. I informed her that  ultrasound can detect up to 95 out of 100 cases of spina  bifida and that amniocentesis will only marginally improve the  detection rate.  Increased AFP can also be associated with fetal growth   restriction (placental insufficiency) and preterm delivery. Low  amniotic fluid is associated with placental insufficiency and  stillbirth is associated with increased AFP in some cases.  I recommended serial fetal growth assessments.  Patient met with our genetic counselor after ultrasound. See  separate letter from our genetic counselor. ---------------------------------------------------------------------- Recommendations  -An appointment was made for her to return in 3 weeks for  fetal growth and amniotic fluid assessments.  -I advised the patient to call your office if she complains of  decreased fetal movements. 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  She may consider repeat C-section as she also wants her tubes tied - Bilateral tubal ligation reviewed with R&B including but not limited to bleeding, infection, injury to other organs, irreversibility and failure rate of 08/998. Questions and concerns were addressed and desires tubal ligation- inform consent obtained -Trich being treat with Flagyl -HSV2- started on valtrex suppression,  currently asymptomatic -hydroxyzine prn for anxiety    JeJanyth PupaDO Attending ObWeeki Wache[place] GardensFaDakota Cityor WoDean Foods CompanyCoGwynn

## 2022-05-19 DIAGNOSIS — O42919 Preterm premature rupture of membranes, unspecified as to length of time between rupture and onset of labor, unspecified trimester: Secondary | ICD-10-CM | POA: Diagnosis not present

## 2022-05-19 DIAGNOSIS — O42912 Preterm premature rupture of membranes, unspecified as to length of time between rupture and onset of labor, second trimester: Principal | ICD-10-CM

## 2022-05-19 LAB — CMV ANTIBODY, IGG (EIA): CMV Ab - IgG: >10 U/mL — ABNORMAL HIGH (ref 0.00–0.59)

## 2022-05-19 LAB — HSV 2 ANTIBODY, IGG: HSV 2 Glycoprotein G Ab, IgG: 13.1 index — ABNORMAL HIGH (ref 0.00–0.90)

## 2022-05-19 LAB — PARVOVIRUS B19 ANTIBODY, IGG AND IGM
Parovirus B19 IgG Abs: 4.3 index — ABNORMAL HIGH (ref 0.0–0.8)
Parovirus B19 IgM Abs: 0.2 index (ref 0.0–0.8)

## 2022-05-19 LAB — RHOGAM INJECTION: Unit division: 0

## 2022-05-19 LAB — RUBELLA SCREEN: Rubella: 8.85 index (ref >0.99)

## 2022-05-19 LAB — TOXOPLASMA GONDII ANTIBODY, IGG: Toxoplasma IgG Ratio: <3 IU/mL (ref 0.0–7.1)

## 2022-05-19 LAB — RPR: RPR Ser Ql: NONREACTIVE

## 2022-05-19 LAB — HSV 1 ANTIBODY, IGG: HSV 1 Glycoprotein G Ab, IgG: <0.91 index (ref 0.00–0.90)

## 2022-05-19 NOTE — Progress Notes (Addendum)
Daily Antepartum Note  Admission Date: 05/17/2022 Current Date: 05/19/2022 9:16 AM  Melissa [name] is a 21 y.o. [id number] @ [redacted]w[redacted]d, admitted for PPROM.  Pregnancy complicated by: Patient Active Problem List   Diagnosis Date Noted   Gonorrhea affecting pregnancy in second trimester 05/18/2022   ?fetal pericardial effusion 05/18/2022   [redacted] weeks gestation of pregnancy    Preterm premature rupture of membranes (PPROM) with unknown onset of labor 05/17/2022   Oligohydramnios antepartum 05/11/2022   Abnormal antenatal AFP screen 04/12/2022   History of cesarean section 04/07/2022   Bartholin cyst 04/07/2022   History of herpes genitalis 04/07/2022   Supervision of high risk pregnancy, antepartum 02/08/2022   Trichomonal vaginitis during pregnancy in second trimester 02/01/2022   History of postpartum depression 11/16/2021   Abnormal involuntary movements 08/04/2020   Migraine with aura and without status migrainosus, not intractable 08/12/2019   Seizures ([place])    Sickle cell trait ([place]) 02/13/2019   Rh negative state in antepartum period 02/05/2019   Short interval between pregnancies affecting pregnancy in second trimester, antepartum 02/05/2019   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 +still LOF but now clear, no contractions or bleeding.   Objective:    Current Vital Signs 24h Vital Sign Ranges  T 98.4 F (36.9 C) Temp  Avg: 98.3 F (36.8 C)  Min: 97.7 F (36.5 C)  Max: 98.8 F (37.1 C)  BP (!) 99/53 BP   Min: 93/56  Max: 108/61  HR 71 Pulse  Avg: 76.6  Min: 68  Max: 84  RR 17 Resp  Avg: 17.1  Min: 16  Max: 19  SaO2 100 % Room Air SpO2  Avg: 100 %  Min: 100 %  Max: 100 %       24 Hour I/O Current Shift I/O  Time Ins Outs 09/28 0701 - 09/29 0700 In: 1101.7 [I.V.:1051.7] Out: 7200 [Urine:7200] No intake/output data recorded.   Fetal Heart Tones: 135 baseline, ?accels, no decel, mod variability Tocometry: quiet  Physical exam: General: Well nourished, well developed female in no acute distress. Abdomen: gravid nttp Cardiovascular: S1, S2 normal, no murmur, rub or gallop, regular rate and rhythm Respiratory: CTAB Extremities: no clubbing, cyanosis or edema Skin: Warm and dry.   Medications: Current Facility-Administered Medications  Medication Dose Route Frequency Provider Last Rate Last Admin   0.9 %  sodium chloride infusion   Intravenous Continuous Seabron Spates, CNM   Held at 05/17/22 2257   acetaminophen (TYLENOL) tablet 650 mg  650 mg Oral Q4H PRN Seabron Spates, CNM   650 mg at 05/18/22 1032   alum & mag hydroxide-simeth (MAALOX/MYLANTA) 200-200-20 MG/5ML suspension 30 mL  30 mL Oral Once Aletha Halim, MD       ampicillin (OMNIPEN) 2 g in sodium chloride 0.9 % 100 mL IVPB  2 g Intravenous Q6H Seabron Spates, CNM 300 mL/hr at 05/19/22 0442 2 g at 05/19/22 0442   Followed by   amoxicillin (AMOXIL) capsule 500 mg  500 mg Oral TID Seabron Spates, CNM  calcium carbonate (TUMS - dosed in mg elemental calcium) chewable tablet 400 mg of elemental calcium  2 tablet Oral Q4H PRN Aviva Signs, CNM   400 mg of elemental calcium at 05/18/22 2320   hydrOXYzine (ATARAX) tablet 25 mg  25 mg Oral TID PRN Myna Hidalgo, DO   25 mg at 05/18/22 1702   metroNIDAZOLE (FLAGYL) tablet 500 mg  500 mg Oral Q12H Ozan, Jennifer, DO   500 mg at 05/18/22 2210   pantoprazole (PROTONIX) EC tablet 40 mg  40 mg Oral Daily Reva Bores, MD       polyethylene glycol (MIRALAX /  GLYCOLAX) packet 17 g  17 g Oral Daily Yonah Bing, MD   17 g at 05/18/22 1024   prenatal multivitamin tablet 1 tablet  1 tablet Oral Q1200 Aviva Signs, CNM   1 tablet at 05/18/22 1259   valACYclovir (VALTREX) tablet 500 mg  500 mg Oral BID Myna Hidalgo, DO   500 mg at 05/18/22 2210    Labs:  Recent Labs  Lab 05/18/22 0645  WBC 9.8  HGB 10.8*  HCT 32.4*  PLT 212    Recent Labs  Lab 05/17/22 2222  NA 136  K 3.7  CL 108  CO2 22  BUN 7  CREATININE 0.62  CALCIUM 8.6*  PROT 6.5  BILITOT 0.1*  ALKPHOS 76  ALT 10  AST 17  GLUCOSE 86   Radiology:  No new imaging  Assessment & Plan:  Pt stable *Pregnancy:bid NSTs.  -BTL papers signed 9/28 *PPROM: she had some bleeding on admission which could have made the amnisure falsely positive but pt continues to endorse LOF with oligo. Can consider repeat spec exam this weekend. Continue latency abx *Preterm: s/p nicu consult, bmz 9/27 and 9/28 *ID: continue flagyl. Continue valtrex ppx. HIV and RPR negative. S/p rocephin.  *?fetal pericardial effusion: MFM to see if can do fetal echo while inpatient. Plan to at least repeat u/s in one week. F/u torch, parvo labs.  *h/o c-section and short interval pregnancy: for TOLAC if already in labor and pt would like rpt if needs delivery but not in labor. Tolac consent signed 9/28 *Rh neg: rhogam on 9/28 *PPx: SCDs, OOB ad lib *FEN/GI: SLIV  Cornelia Copa MD Attending Center for [facility] (Faculty Practice) GYN Consult Phone: [phone] (M-F, [phone]) & [phone]  (Off hours, weekends, holidays)

## 2022-05-20 DIAGNOSIS — O42919 Preterm premature rupture of membranes, unspecified as to length of time between rupture and onset of labor, unspecified trimester: Secondary | ICD-10-CM | POA: Diagnosis not present

## 2022-05-20 NOTE — Progress Notes (Signed)
Patient tearful and insistent on going outside. Day and Night RN educated patient on risks of going out with possible PPROM.   Danie Binder, RN

## 2022-05-20 NOTE — Progress Notes (Signed)
Daily Antepartum Note  Admission Date: 05/17/2022 Current Date: 05/20/2022 11:00 AM  Melissa [name] is a 21 y.o. [id number] @ [redacted]w[redacted]d, admitted for PPROM.  Pregnancy complicated by: Patient Active Problem List   Diagnosis Date Noted   Gonorrhea affecting pregnancy in second trimester 05/18/2022   ?fetal pericardial effusion 05/18/2022   [redacted] weeks gestation of pregnancy    Preterm premature rupture of membranes (PPROM) with unknown onset of labor 05/17/2022   Oligohydramnios antepartum 05/11/2022   Abnormal antenatal AFP screen 04/12/2022   History of cesarean section 04/07/2022   Bartholin cyst 04/07/2022   History of herpes genitalis 04/07/2022   Supervision of high risk pregnancy, antepartum 02/08/2022   Trichomonal vaginitis during pregnancy in second trimester 02/01/2022   History of postpartum depression 11/16/2021   Abnormal involuntary movements 08/04/2020   Migraine with aura and without status migrainosus, not intractable 08/12/2019   Seizures ([place])    Sickle cell trait ([place]) 02/13/2019   Rh negative state in antepartum period 02/05/2019   Short interval between pregnancies affecting pregnancy in second trimester, antepartum 02/05/2019   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 +still LOF but now clear, no contractions or bleeding.   Objective:    Current Vital Signs 24h Vital Sign Ranges  T 98.4 F (36.9 C) Temp  Avg: 98.5 F (36.9 C)  Min: 98 F (36.7 C)  Max: 98.9 F (37.2 C)  BP (!) 90/53 BP  Min:  90/53  Max: 100/57  HR 68 Pulse  Avg: 75.6  Min: 68  Max: 80  RR 16 Resp  Avg: 17.6  Min: 16  Max: 18  SaO2 97 %  (room air) SpO2  Avg: 99.4 %  Min: 97 %  Max: 100 %       24 Hour I/O Current Shift I/O  Time Ins Outs 09/29 0701 - 09/30 0700 In: -  Out: 1500 [Urine:1500] No intake/output data recorded.   1) Fetal Heart Tones: 140 baseline, no accel, no decel, mod variability Tocometry: quiet  2) Fetal Heart Tones: 140 baseline, no accel, no decel, mod variability Tocometry: quiet  Physical exam: General: Well nourished, well developed female in no acute distress. Abdomen: gravid nttp Cardiovascular: S1, S2 normal, no murmur, rub or gallop, regular rate and rhythm Respiratory: CTAB Extremities: no clubbing, cyanosis or edema Skin: Warm and dry.   Medications: Current Facility-Administered Medications  Medication Dose Route Frequency Provider Last Rate Last Admin   0.9 %  sodium chloride infusion   Intravenous Continuous Seabron Spates, CNM   Held at 05/17/22 2257   acetaminophen (TYLENOL) tablet 650 mg  650 mg Oral Q4H PRN Seabron Spates, CNM   650 mg at 05/19/22 2017   alum & mag hydroxide-simeth (MAALOX/MYLANTA) 200-200-20 MG/5ML suspension 30 mL  30 mL Oral Once Aletha Halim, MD       amoxicillin (AMOXIL) capsule 500 mg  500 mg Oral TID Seabron Spates, CNM   500 mg at 05/19/22 2232   calcium carbonate (TUMS - dosed in mg elemental calcium) chewable tablet 400 mg of elemental calcium  2 tablet Oral Q4H PRN Aviva Signs, CNM   400 mg of elemental calcium at 05/18/22 2320   hydrOXYzine (ATARAX) tablet 25 mg  25 mg Oral TID PRN Myna Hidalgo, DO   25 mg at 05/19/22 2017   metroNIDAZOLE (FLAGYL) tablet 500 mg  500 mg Oral Q12H Ozan, Victorino Dike, DO   500 mg at 05/19/22 2232   pantoprazole (PROTONIX) EC tablet 40 mg  40 mg Oral Daily Reva Bores, MD   40 mg at 05/19/22 1026   polyethylene glycol (MIRALAX / GLYCOLAX) packet 17 g  17 g Oral Daily Vidalia Bing, MD    17 g at 05/19/22 1027   prenatal multivitamin tablet 1 tablet  1 tablet Oral Q1200 Aviva Signs, CNM   1 tablet at 05/19/22 1221   valACYclovir (VALTREX) tablet 500 mg  500 mg Oral BID Myna Hidalgo, DO   500 mg at 05/19/22 2231   Labs:  Parvo +IgG, neg IgM HSV2 +IgG, neg HSV1 IgG CMV +IgG Toxo neg IgG Rubella immune  Recent Labs  Lab 05/18/22 0645  WBC 9.8  HGB 10.8*  HCT 32.4*  PLT 212    Recent Labs  Lab 05/17/22 2222  NA 136  K 3.7  CL 108  CO2 22  BUN 7  CREATININE 0.62  CALCIUM 8.6*  PROT 6.5  BILITOT 0.1*  ALKPHOS 76  ALT 10  AST 17  GLUCOSE 86    Radiology:  No new imaging  Assessment & Plan:  Pt stable *Pregnancy: bid NSTs.  -BTL papers signed 9/28 *PPROM: she had some bleeding on admission which could have made the amnisure falsely positive but pt continues to endorse LOF with oligo. Can consider repeat spec exam this weekend. Continue latency abx *Preterm: s/p nicu consult, bmz 9/27 and 9/28 *ID: continue flagyl. Continue valtrex ppx. HIV and RPR negative. S/p rocephin.  *?fetal pericardial effusion: MFM to see if can do fetal echo while inpatient. Plan to at least repeat u/s in one week. TORCH, Parvo labs not concerning.  *h/o c-section and short interval pregnancy: for TOLAC if already in labor and pt would like rpt if needs delivery but not in labor. Tolac consent signed 9/28 *Rh neg: rhogam on 9/28 *PPx: SCDs, OOB ad lib *FEN/GI: SLIV  Cornelia Copa MD Attending Center for [facility] (Faculty Practice) GYN Consult Phone: [phone] (M-F, [phone]) & [phone]  (Off hours, weekends, holidays)

## 2022-05-21 DIAGNOSIS — O42919 Preterm premature rupture of membranes, unspecified as to length of time between rupture and onset of labor, unspecified trimester: Secondary | ICD-10-CM | POA: Diagnosis not present

## 2022-05-21 LAB — AMNISURE RUPTURE OF MEMBRANE (ROM) NOT AT ARMC: Amnisure ROM: NEGATIVE

## 2022-05-21 NOTE — Progress Notes (Signed)
OB Note Amnisure negative. I spoke with MFM Dr. Gertie Exon and he recommends repeat ultrasound tomorrow and MFM will reassess. Patient amenable to plan  Durene Romans MD Attending Center for [place] (Faculty Practice) 05/21/2022 Time: 10am

## 2022-05-21 NOTE — Progress Notes (Addendum)
Daily Antepartum Note  Admission Date: 05/17/2022 Current Date: 05/21/2022 9:07 AM  Melissa [name] is a 21 y.o. [id number] @ [redacted]w[redacted]d, admitted for PPROM.  Pregnancy complicated by: Patient Active Problem List   Diagnosis Date Noted   Gonorrhea affecting pregnancy in second trimester 05/18/2022   ?fetal pericardial effusion 05/18/2022   [redacted] weeks gestation of pregnancy    Preterm premature rupture of membranes (PPROM) with unknown onset of labor 05/17/2022   Oligohydramnios antepartum 05/11/2022   Abnormal antenatal AFP screen 04/12/2022   History of cesarean section 04/07/2022   Bartholin cyst 04/07/2022   History of herpes genitalis 04/07/2022   Supervision of high risk pregnancy, antepartum 02/08/2022   Trichomonal vaginitis during pregnancy in second trimester 02/01/2022   History of postpartum depression 11/16/2021   Abnormal involuntary movements 08/04/2020   Migraine with aura and without status migrainosus, not intractable 08/12/2019   Seizures ([name])    Sickle cell trait ([name]) 02/13/2019   Rh negative state in antepartum period 02/05/2019   Short interval between pregnancies affecting pregnancy in second trimester, antepartum 02/05/2019   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   Objective:    Current Vital Signs 24h Vital Sign Ranges  T 98.4 F (36.9 C) Temp  Avg: 98.3 F (36.8 C)  Min: 98 F (36.7 C)  Max: 98.4 F (36.9 C)  BP 112/74 BP  Min: 89/62  Max: 112/74  HR 73 Pulse  Avg: 74.8   Min: 70  Max: 85  RR 18 Resp  Avg: 17.7  Min: 17  Max: 18  SaO2 100 % Room Air SpO2  Avg: 100 %  Min: 100 %  Max: 100 %       24 Hour I/O Current Shift I/O  Time Ins Outs No intake/output data recorded. No intake/output data recorded.   1) Fetal Heart Tones: 155 baseline, ?accels, episodic decel at 2200, mod variability Tocometry: quiet  2) Fetal Heart Tones: 150 baseline, ? accels, ?variable decel, mod variability Tocometry: quiet  Physical exam: General: Well nourished, well developed female in no acute distress. Abdomen: gravid nttp Cardiovascular: S1, S2 normal, no murmur, rub or gallop, regular rate and rhythm Respiratory: CTAB Extremities: no clubbing, cyanosis or edema Skin: Warm and dry.  Pelvic: EGBUS normal>>amnisure done  Medications: Current Facility-Administered Medications  Medication Dose Route Frequency Provider Last Rate Last Admin   0.9 %  sodium chloride infusion   Intravenous Continuous Aviva Signs, CNM   Held at 05/17/22 2257   acetaminophen (TYLENOL) tablet 650 mg  650 mg Oral Q4H PRN Aviva Signs, CNM   650 mg at 05/20/22 2036   alum & mag hydroxide-simeth (MAALOX/MYLANTA) 200-200-20 MG/5ML suspension 30 mL  30 mL Oral Once [place] Bing, MD       amoxicillin (AMOXIL) capsule 500 mg  500 mg Oral TID Aviva Signs, CNM   500 mg at 05/20/22 2151   calcium carbonate (TUMS - dosed in mg elemental calcium) chewable tablet 400 mg of elemental calcium  2 tablet Oral Q4H PRN Aviva Signs, CNM  400 mg of elemental calcium at 05/18/22 2320   hydrOXYzine (ATARAX) tablet 25 mg  25 mg Oral TID PRN Janyth Pupa, DO   25 mg at 05/20/22 2150   metroNIDAZOLE (FLAGYL) tablet 500 mg  500 mg Oral Q12H Ozan, Jennifer, DO   500 mg at 05/20/22 2150   pantoprazole (PROTONIX) EC tablet 40 mg  40 mg Oral Daily Donnamae Jude, MD   40 mg at 05/20/22 1102   polyethylene glycol (MIRALAX / GLYCOLAX) packet 17 g  17 g Oral Daily Aletha Halim, MD   17 g at  05/20/22 1102   prenatal multivitamin tablet 1 tablet  1 tablet Oral Q1200 Seabron Spates, CNM   1 tablet at 05/20/22 1231   valACYclovir (VALTREX) tablet 500 mg  500 mg Oral BID Janyth Pupa, DO   500 mg at 05/20/22 2150   Labs:  No new labs  Radiology:  No new imaging  Assessment & Plan:  Pt stable *Pregnancy: continue bid NSTs.  -BTL papers signed 9/28 *PPROM: repeat amnisure done today. Continue latency abx *Preterm: s/p nicu consult, bmz 9/27 and 9/28 *ID: continue flagyl (for trich). Continue valtrex ppx. HIV and RPR negative. S/p rocephin (for gonorrhea).  *?fetal pericardial effusion: MFM to see if can do fetal echo while inpatient. Plan to at least repeat u/s in one week. TORCH, Parvo labs not concerning.  *h/o c-section and short interval pregnancy: for TOLAC if already in labor and pt would like rpt if needs delivery but not in labor. Tolac consent signed 9/28 *Rh neg: rhogam on 9/28 *PPx: SCDs, OOB ad lib *FEN/GI: SLIV  Durene Romans MD Attending Center for [place] [facility] [place] Phone: [phone] (M-F, [phone]) & [phone]  (Off hours, weekends, holidays)

## 2022-05-22 ENCOUNTER — Inpatient Hospital Stay (HOSPITAL_COMMUNITY)
Admission: AD | Admit: 2022-05-22 | Discharge: 2022-05-22 | Disposition: A | Discharge status: Home / Self Care | Payer: Medicaid Other | Attending: Obstetrics and Gynecology | Admitting: Obstetrics and Gynecology

## 2022-05-22 ENCOUNTER — Ambulatory Visit: Payer: Medicaid Other

## 2022-05-22 ENCOUNTER — Other Ambulatory Visit: Payer: Self-pay

## 2022-05-22 ENCOUNTER — Inpatient Hospital Stay (HOSPITAL_BASED_OUTPATIENT_CLINIC_OR_DEPARTMENT_OTHER): Payer: Medicaid Other

## 2022-05-22 ENCOUNTER — Ambulatory Visit: Payer: Self-pay | Admitting: *Deleted

## 2022-05-22 DIAGNOSIS — O42919 Preterm premature rupture of membranes, unspecified as to length of time between rupture and onset of labor, unspecified trimester: Secondary | ICD-10-CM | POA: Diagnosis not present

## 2022-05-22 DIAGNOSIS — O281 Abnormal biochemical finding on antenatal screening of mother: Secondary | ICD-10-CM

## 2022-05-22 DIAGNOSIS — Z0371 Encounter for suspected problem with amniotic cavity and membrane ruled out: Secondary | ICD-10-CM

## 2022-05-22 DIAGNOSIS — Z3A24 24 weeks gestation of pregnancy: Secondary | ICD-10-CM

## 2022-05-22 DIAGNOSIS — O0990 Supervision of high risk pregnancy, unspecified, unspecified trimester: Secondary | ICD-10-CM

## 2022-05-22 DIAGNOSIS — O280 Abnormal hematological finding on antenatal screening of mother: Secondary | ICD-10-CM | POA: Diagnosis not present

## 2022-05-22 DIAGNOSIS — O360120 Maternal care for anti-D [Rh] antibodies, second trimester, not applicable or unspecified: Secondary | ICD-10-CM

## 2022-05-22 DIAGNOSIS — O4102X0 Oligohydramnios, second trimester, not applicable or unspecified: Secondary | ICD-10-CM | POA: Diagnosis not present

## 2022-05-22 DIAGNOSIS — J45909 Unspecified asthma, uncomplicated: Secondary | ICD-10-CM

## 2022-05-22 DIAGNOSIS — E669 Obesity, unspecified: Secondary | ICD-10-CM

## 2022-05-22 DIAGNOSIS — O42912 Preterm premature rupture of membranes, unspecified as to length of time between rupture and onset of labor, second trimester: Secondary | ICD-10-CM | POA: Diagnosis not present

## 2022-05-22 DIAGNOSIS — O26892 Other specified pregnancy related conditions, second trimester: Secondary | ICD-10-CM | POA: Diagnosis not present

## 2022-05-22 DIAGNOSIS — B009 Herpesviral infection, unspecified: Secondary | ICD-10-CM

## 2022-05-22 DIAGNOSIS — O4100X0 Oligohydramnios, unspecified trimester, not applicable or unspecified: Secondary | ICD-10-CM

## 2022-05-22 DIAGNOSIS — O98512 Other viral diseases complicating pregnancy, second trimester: Secondary | ICD-10-CM | POA: Diagnosis not present

## 2022-05-22 DIAGNOSIS — O34219 Maternal care for unspecified type scar from previous cesarean delivery: Secondary | ICD-10-CM

## 2022-05-22 DIAGNOSIS — O99512 Diseases of the respiratory system complicating pregnancy, second trimester: Secondary | ICD-10-CM

## 2022-05-22 DIAGNOSIS — G40909 Epilepsy, unspecified, not intractable, without status epilepticus: Secondary | ICD-10-CM

## 2022-05-22 DIAGNOSIS — O23592 Infection of other part of genital tract in pregnancy, second trimester: Secondary | ICD-10-CM | POA: Diagnosis not present

## 2022-05-22 DIAGNOSIS — O99352 Diseases of the nervous system complicating pregnancy, second trimester: Secondary | ICD-10-CM

## 2022-05-22 DIAGNOSIS — R772 Abnormality of alphafetoprotein: Secondary | ICD-10-CM

## 2022-05-22 DIAGNOSIS — O99212 Obesity complicating pregnancy, second trimester: Secondary | ICD-10-CM

## 2022-05-22 DIAGNOSIS — A5901 Trichomonal vulvovaginitis: Secondary | ICD-10-CM | POA: Diagnosis not present

## 2022-05-22 LAB — POCT FERN TEST: POCT Fern Test: NEGATIVE

## 2022-05-22 MED ORDER — VALACYCLOVIR HCL 1 G PO TABS: 1000.0000 mg | ORAL_TABLET | Freq: Two times a day (BID) | ORAL | 5 refills | Status: DC

## 2022-05-22 NOTE — Telephone Encounter (Signed)
  Chief Complaint: dizziness, heart racing Symptoms: c/o feeling dizzy and heart racing since being discharged from hospital today . [redacted] weeks pregnant. Hx treated for trichomonas vaginitis. Taking antibiotic today and c/o feeling hot then dizzy.  Frequency: today  Pertinent Negatives: Patient denies chest pain no difficulty breathing.  Disposition: [x] ED /[] Urgent Care (no appt availability in office) / [] Appointment(In office/virtual)/ []  [place] [facility]/ [] Home Care/ [] Refused Recommended Disposition /[] [place] [facility]/ []  Follow-up with PCP Additional Notes:   No PCP. Recommended patient go back to ED due to sx. Recommended patient eat if taking antibiotics and to stay hydrated. Recommended to eat yogurt and return to ED due to no PCP.     Reason for Disposition  Extra heartbeats, irregular heart beating, or heart is beating very fast  (i.e., "palpitations")  Answer Assessment - Initial Assessment Questions 1. DESCRIPTION: "Describe your dizziness."     Lightheaded and hot when standing  2. LIGHTHEADED: "Do you feel lightheaded?" (e.g., somewhat faint, woozy, weak upon standing)     Weak upon standing  3. VERTIGO: "Do you feel like either you or the room is spinning or tilting?" (i.e. vertigo)     No  4. SEVERITY: "How bad is it?"  "Do you feel like you are going to faint?" "Can you stand and walk?"   - MILD: Feels slightly dizzy, but walking normally.   - MODERATE: Feels unsteady when walking, but not falling; interferes with normal activities (e.g., school, work).   - SEVERE: Unable to walk without falling, or requires assistance to walk without falling; feels like passing out now.      Moderate- can walk. Feels dizzy and hot after taking antibiotics  5. ONSET:  "When did the dizziness begin?"     Has been in hospital and released  6. AGGRAVATING FACTORS: "Does anything make it worse?" (e.g., standing, change in head position)     Worse with standing  7. HEART RATE:  "Can you tell me your heart rate?" "How many beats in 15 seconds?"  (Note: not all patients can do this)       na 8. CAUSE: "What do you think is causing the dizziness?"     Antibiotics  9. RECURRENT SYMPTOM: "Have you had dizziness before?" If Yes, ask: "When was the last time?" "What happened that time?"     In hospital since 05/17/22 10. OTHER SYMPTOMS: "Do you have any other symptoms?" (e.g., fever, chest pain, vomiting, diarrhea, bleeding)       Heart racing , dizziness when standing . Started on antibiotics today  11. PREGNANCY: "Is there any chance you are pregnant?" "When was your last menstrual period?"       [redacted] weeks pregnant  Protocols used: Dizziness - Lightheadedness-A-AH

## 2022-05-22 NOTE — Progress Notes (Signed)
I entered room to take supplies for [employer] exam and the patient is not in the room. I looked for her in the family room and she is not there either. Dr Elgie Congo made aware.

## 2022-05-22 NOTE — MAU Note (Signed)
Melissa [name] is a 21 y.o. at [redacted]w[redacted]d here in MAU reporting: was admitted to [facility] on 9/27 for PPROM. Left the hospital today around noon AMA. Was supposed to have a pelvic exam today for a fern test. But left prior to that being completed. No more LOF. No bleeding. No pain. +FM   Onset of complaint: ongoing  Pain score: 0/10  Vitals:   05/22/22 1450  BP: 112/70  Pulse: 100  Resp: 16  Temp: 98.5 F (36.9 C)  SpO2: 97%     FHT:172  Lab orders placed from triage: none

## 2022-05-22 NOTE — Consult Note (Signed)
MFM Note  Melissa [name] is a 21 year old gravida 4 para 2 currently at 24 weeks and 2 days.  She was seen in consultation at the request of Dr. Ilda Basset due to oligohydramnios.    The patient's pregnancy has also been complicated by an unexplained elevated MSAFP of 4.1 MoM.  Her amniotic fluid level was noted to be decreased during her fetal anatomy scan performed 2 weeks ago.  However, a 2.2 cm pocket of amniotic fluid was present during that exam.  The patient was admitted 3 days ago due to possible PPROM when she presented to the MAU complaining of bleeding and leakage of fluid.  An AmniSure test performed at the time of admission was positive.    Due to PPROM, she was given a complete course of antenatal corticosteroids and placed on magnesium sulfate for fetal neuroprotection.  The patient was treated for both a trichomonas infection and a gonorrhea infection.  She reports that she is no longer leaking fluid.  Her AmniSure test performed yesterday was negative.  An ultrasound performed this morning shows oligohydramnios with only a 1.3 cm  pocket of cord free fluid present.  Fetal movements including fetal breathing movements were noted on today's exam.  The fetus is in the breech presentation.  Doppler studies of the umbilical arteries performed today continues to show normal forward flow.  There were no signs of absent or reversed end-diastolic flow.  The patient was advised that the oligohydramnios noted on her ultrasound exams may be a sign of rupture of membranes or it may indicate placental dysfunction as evidenced by her elevated MSAFP level.   Two normal-appearing fetal kidneys including a normal-appearing filled fetal bladder were noted on today's ultrasound exam indicating that renal agenesis is not the cause of her low amniotic fluid levels.  I discussed her case with Dr. Elgie Congo.  He will perform a speculum exam to ensure that there is no ferning to indicate that she ruptured  membranes.  If rupture of membranes is ruled out, the patient may be discharged home.    We will continue to follow her with weekly ultrasound exams for fetal and amniotic fluid assessments.    We will start twice weekly NSTs at around 28 weeks.    Should oligohydramnios continue to be noted with reassuring fetal testing, delivery should be considered at around 34 weeks.    She should be given a rescue course of steroids prior to delivery at 34 weeks.  The patient understands that due to oligohydramnios, there is an increased risk of a fetal demise.  She was advised to drink plenty of fluids and to monitor fetal movements daily. Hopefully, with increased fetal surveillance, we can help her further modify the risk for a demise.  The patient is requesting to be discharged home as she has 2 young kids at home who need her care.  I will have someone from our office contact her to schedule another ultrasound later this week.

## 2022-05-22 NOTE — Plan of Care (Signed)
?  Problem: Education: ?Goal: Knowledge of General Education information will improve ?Description: Including pain rating scale, medication(s)/side effects and non-pharmacologic comfort measures ?Outcome: Adequate for Discharge ?  ?Problem: Health Behavior/Discharge Planning: ?Goal: Ability to manage health-related needs will improve ?Outcome: Adequate for Discharge ?  ?Problem: Clinical Measurements: ?Goal: Ability to maintain clinical measurements within normal limits will improve ?Outcome: Adequate for Discharge ?Goal: Will remain free from infection ?Outcome: Adequate for Discharge ?Goal: Diagnostic test results will improve ?Outcome: Adequate for Discharge ?Goal: Respiratory complications will improve ?Outcome: Adequate for Discharge ?Goal: Cardiovascular complication will be avoided ?Outcome: Adequate for Discharge ?  ?Problem: Activity: ?Goal: Risk for activity intolerance will decrease ?Outcome: Adequate for Discharge ?  ?Problem: Nutrition: ?Goal: Adequate nutrition will be maintained ?Outcome: Adequate for Discharge ?  ?Problem: Coping: ?Goal: Level of anxiety will decrease ?Outcome: Adequate for Discharge ?  ?Problem: Elimination: ?Goal: Will not experience complications related to bowel motility ?Outcome: Adequate for Discharge ?Goal: Will not experience complications related to urinary retention ?Outcome: Adequate for Discharge ?  ?Problem: Pain Managment: ?Goal: General experience of comfort will improve ?Outcome: Adequate for Discharge ?  ?Problem: Safety: ?Goal: Ability to remain free from injury will improve ?Outcome: Adequate for Discharge ?  ?Problem: Skin Integrity: ?Goal: Risk for impaired skin integrity will decrease ?Outcome: Adequate for Discharge ?  ?Problem: Education: ?Goal: Knowledge of disease or condition will improve ?Outcome: Adequate for Discharge ?Goal: Knowledge of the prescribed therapeutic regimen will improve ?Outcome: Adequate for Discharge ?Goal: Individualized Educational  Video(s) ?Outcome: Adequate for Discharge ?  ?Problem: Clinical Measurements: ?Goal: Complications related to the disease process, condition or treatment will be avoided or minimized ?Outcome: Adequate for Discharge ?  ?

## 2022-05-22 NOTE — MAU Provider Note (Signed)
History     CSN: [id number]  Arrival date and time: 05/22/22 1432   Event Date/Time   First Provider Initiated Contact with Patient 05/22/22 1507      Chief Complaint  Patient presents with   Rupture of Membranes   Melissa [name] is a 21 y.o. [id number] at 42w2dwho presents today for an exam to rule out rupture of membranes. Patient was on OBSC and had a +amnisure and then a negative amnisure. She was also found to have oligohydramnios. Due to inconsistent amnisure results and the possibility that some blood on the first one could have given a false positive they were going to so SSE and fern test today. However the patient left AMA before this could be done. She is back now for the exam.     OB History     Gravida  4   Para  2   Term  2   Preterm      AB  1   Living  2      SAB      IAB      Ectopic      Multiple  0   Live Births  2           Past Medical History:  Diagnosis Date   Anemia    Anxiety    Asthma    inhaler. last attack June 2019   Congenital hydronephrosis 2001   Constipation    Depression    Episodic tension-type headache, not intractable 04/16/2015   Family history of adverse reaction to anesthesia    mom rushed to hospital from Dentist office   Genital herpes    HSV infection 08/11/2021   CONCERN for primary outbreak at [redacted]w[redacted]d>see 12/20 office note    Low back strain, sequela 11/10/2020   Migraine without aura and without status migrainosus, not intractable 04/16/2015   PID (pelvic inflammatory disease)    Post-partum depression 11/17/2021   Reactive airway disease in pediatric patient    Rh negative state in antepartum period 02/05/2019   Seizures ([facility]   last one in 2015 - on meds   [place]   Status post primary low transverse cesarean section 08/31/2021   TBI (traumatic brain injury) ([facility]    Past Surgical History:  Procedure Laterality Date   APPENDECTOMY     CESAREAN SECTION N/A  08/31/2021   Procedure: CESAREAN SECTION;  Surgeon: WoGwynne EdingerMD;  Location: [facility];  Service: Obstetrics;  Laterality: N/A;   HERNIA REPAIR     INTESTINAL MALROTATION REPAIR  2001    Family History  Problem Relation Age of Onset   Depression Mother    Stroke Mother    Obesity Mother    Post-traumatic stress disorder Mother    Anxiety disorder Mother    Hypertension Mother    Diabetes Mother    Schizophrenia Father    Kidney disease Father     Social History   Tobacco Use   Smoking status: Every Day    Packs/day: 0.25    Types: Cigarettes   Smokeless tobacco: Never   Tobacco comments:    black and milds, 2 cigs daily  Vaping Use   Vaping Use: Some days  Substance Use Topics   Alcohol use: Not Currently    Comment: occasionally, prior to pregnancy   Drug use: Not Currently    Types: Marijuana    Comment: last 2020  Allergies:  Allergies  Allergen Reactions   Benadryl [Diphenhydramine Hcl] Other (See Comments)    Causes seizures    Gluten Meal Nausea And Vomiting   Shellfish Allergy Anaphylaxis    Patient states her throat gets tight   Zithromax [Azithromycin] Anaphylaxis and Swelling   Haldol [Haloperidol] Swelling    Tongue swelling.   Lactose Intolerance (Gi) Diarrhea   Latex Swelling    Labial edema after usage of latex condoms.     Medications Prior to Admission  Medication Sig Dispense Refill Last Dose   Blood Pressure Monitoring (BLOOD PRESSURE KIT) DEVI 1 kit by Does not apply route once a week. 1 each 0    ferrous sulfate 325 (65 FE) MG tablet Take 1 tablet (325 mg total) by mouth at bedtime. 30 tablet 3    hydrOXYzine (ATARAX) 25 MG tablet Take 1 tablet (25 mg total) by mouth 3 (three) times daily as needed for anxiety. 75 tablet 1    hydrOXYzine (ATARAX) 25 MG tablet Take 1 tablet (25 mg total) by mouth 3 (three) times daily as needed for anxiety. 30 tablet 2    Misc. Devices (GOJJI WEIGHT SCALE) MISC 1 Device by Does not apply route  every 30 (thirty) days. 1 each 0    Prenatal Vit-Fe Fumarate-FA (PREPLUS) 27-1 MG TABS Take 1 tablet by mouth daily. 30 tablet 13    terconazole (TERAZOL 7) 0.4 % vaginal cream Place 1 applicator vaginally at bedtime. 45 g 0    valACYclovir (VALTREX) 1000 MG tablet Take 1 tablet (1,000 mg total) by mouth 2 (two) times daily. 30 tablet 5     Review of Systems  All other systems reviewed and are negative.  Physical Exam   Blood pressure 112/70, pulse 100, temperature 98.5 F (36.9 C), temperature source Oral, resp. rate 16, height '5\' 6"'  (1.676 m), weight 105.5 kg, last menstrual period 12/03/2021, SpO2 97 %, not currently breastfeeding.  Physical Exam Constitutional:      Appearance: She is well-developed.  HENT:     Head: Normocephalic.  Eyes:     Pupils: Pupils are equal, round, and reactive to light.  Cardiovascular:     Rate and Rhythm: Normal rate and regular rhythm.     Heart sounds: Normal heart sounds.  Pulmonary:     Effort: Pulmonary effort is normal. No respiratory distress.     Breath sounds: Normal breath sounds.  Abdominal:     Palpations: Abdomen is soft.     Tenderness: There is no abdominal tenderness.  Genitourinary:    Vagina: No bleeding.     Comments: External: no lesion Vagina: small amount of white discharge, no pooling Cervix: no fluid seen with valsalva      Musculoskeletal:        General: Normal range of motion.     Cervical back: Normal range of motion and neck supple.  Skin:    General: Skin is warm and dry.  Neurological:     Mental Status: She is alert and oriented to person, place, and time.  Psychiatric:        Mood and Affect: Mood normal.        Behavior: Behavior normal.    NST:  Baseline: 150 Variability: moderate Accels: 10x10 Decels: none Toco: none Reactive/Appropriate for GA   Results for orders placed or performed during the hospital encounter of 05/22/22 (from the past 24 hour(s))  Fern Test     Status: Normal    Collection Time: 05/22/22  3:32 PM  Result Value Ref Range   POCT Fern Test Negative = intact amniotic membranes      MAU Course  Procedures  MDM Dr. Annamaria Boots and Dr. Elgie Congo notified of patient's status and will plan for outpatient follow up as discussed this morning. Will message [facility] for a routine appt to be set up in 2-3 weeks.   Assessment and Plan   1. Trichomonal vaginitis during pregnancy in second trimester   2. Supervision of high risk pregnancy, antepartum   3. Encounter for suspected premature rupture of membranes, with rupture of membranes not found   4. [redacted] weeks gestation of pregnancy   5. HSV infection    DC home in stable condition  Comfort measures reviewed  2nd/3rd Trimester precautions  PTL precautions  Fetal kick counts RX: valtrex 1080m BID x 10 days  Return to MAU as needed FU with OB as planned   Follow-up Information     [facility]).   Why: They will call you with an appointment Contact information: [phone] [facility] week([facility]).   Specialty: Maternal and Fetal Medicine Why: They will call you with an appointment Contact information: [address] [place] [phone]              .HMarcille BuffyDNP, CNM  05/22/22  3:48 PM

## 2022-05-23 ENCOUNTER — Other Ambulatory Visit: Payer: Self-pay | Admitting: *Deleted

## 2022-05-23 DIAGNOSIS — O09899 Supervision of other high risk pregnancies, unspecified trimester: Secondary | ICD-10-CM

## 2022-05-23 DIAGNOSIS — O4100X0 Oligohydramnios, unspecified trimester, not applicable or unspecified: Secondary | ICD-10-CM

## 2022-05-23 NOTE — Discharge Summary (Deleted)
  The note originally documented on this encounter has been moved the the encounter in which it belongs.  

## 2022-05-23 NOTE — Discharge Summary (Signed)
Antenatal Physician Discharge Summary  Patient ID: Melissa [name] MRN: [id number] DOB/AGE: [birth year] 21 y.o.  Admit date: 05/18/22 Discharge date: 05/23/2022  Admission Diagnoses:  oligohydramnios, history of cesarean section, PPROM, abnormal AFP  Discharge Diagnoses: oligohydramnios, history of cesarean section, abnormal AFP  Prenatal Procedures: ultrasound  Consults:  Maternal Fetal Medicine, neonatology  Hospital Course:  Melissa [name] is a 22 y.o. [phone] with IUP at 76w5dadmitted for loss of fluid with cramping and bleeding.  During the initial evaluation amnisure was found to be positive. Pt was admitted for betamethasone, latency antibiotics and magnesium sulfate for neuroprotection. TORCH titers were also ordered.  She did receive rhogam on 9/28.  She also received flagyl for trichomonas infection and rocephin for gonorrhea infection.  Repeat amnisure on 10/1 was negative.  Pt was seen on 10/2 /23 by MFM to discuss current findings and long term plan.  Sterile speculum exam was offered to get a fluid sample for ferning.  If negative pt was to be discharged to have close follow up with MFM and her primary OB.  Pt left AMA without signing forms and before the ferning exam could be completed.  Discharge Exam:   Not performed, pt left before exam completed. Significant Diagnostic Studies:  Results for orders placed or performed during the hospital encounter of 05/22/22 (from the past 168 hour(s))  Fern Test   Collection Time: 05/22/22  3:32 PM  Result Value Ref Range   POCT Fern Test Negative = intact amniotic membranes   Results for orders placed or performed during the hospital encounter of 05/17/22 (from the past 168 hour(s))  Urinalysis, Routine w reflex microscopic Urine, Clean Catch   Collection Time: 05/17/22  8:10 PM  Result Value Ref Range   Color, Urine STRAW (A) YELLOW   APPearance CLEAR CLEAR   Specific Gravity, Urine 1.004 (L) 1.005 - 1.030   pH  7.0 5.0 - 8.0   Glucose, UA NEGATIVE NEGATIVE mg/dL   Hgb urine dipstick LARGE (A) NEGATIVE   Bilirubin Urine NEGATIVE NEGATIVE   Ketones, ur NEGATIVE NEGATIVE mg/dL   Protein, ur NEGATIVE NEGATIVE mg/dL   Nitrite NEGATIVE NEGATIVE   Leukocytes,Ua SMALL (A) NEGATIVE   RBC / HPF 6-10 0 - 5 RBC/hpf   WBC, UA 11-20 0 - 5 WBC/hpf   Bacteria, UA FEW (A) NONE SEEN   Squamous Epithelial / LPF 0-5 0 - 5   Mucus PRESENT   Wet prep, genital   Collection Time: 05/17/22  9:05 PM  Result Value Ref Range   Yeast Wet Prep HPF POC NONE SEEN NONE SEEN   Trich, Wet Prep PRESENT (A) NONE SEEN   Clue Cells Wet Prep HPF POC PRESENT (A) NONE SEEN   WBC, Wet Prep HPF POC >=10 (A) <10   Sperm NONE SEEN   [facility]  Collection Time: 05/17/22  9:05 PM  Result Value Ref Range   Amnisure ROM POSITIVE   GC[facility]Chlamydia probe amp [facility]  Collection Time: 05/17/22  9:05 PM  Result Value Ref Range   Neisseria Gonorrhea Positive (A)    Chlamydia Negative    Comment Normal Reference [place] Chlamydia - Negative    Comment      Normal Reference Range Neisseria Gonorrhea - Negative  Comprehensive metabolic panel   Collection Time: 05/17/22 10:22 PM  Result Value Ref Range   Sodium 136 135 - 145 mmol/L   Potassium 3.7 3.5 - 5.1 mmol/L  Chloride 108 98 - 111 mmol/L   CO2 22 22 - 32 mmol/L   Glucose, Bld 86 70 - 99 mg/dL   BUN 7 6 - 20 mg/dL   Creatinine, Ser 0.62 0.44 - 1.00 mg/dL   Calcium 8.6 (L) 8.9 - 10.3 mg/dL   Total Protein 6.5 6.5 - 8.1 g/dL   Albumin 2.7 (L) 3.5 - 5.0 g/dL   AST 17 15 - 41 U/L   ALT 10 0 - 44 U/L   Alkaline Phosphatase 76 38 - 126 U/L   Total Bilirubin 0.1 (L) 0.3 - 1.2 mg/dL   GFR, Estimated >60 >60 mL/min   Anion gap 6 5 - 15  Type and screen [place]   Collection Time: 05/17/22 10:22 PM  Result Value Ref Range   ABO/RH(D) O NEG    Antibody Screen NEG    Sample Expiration       05/20/2022,2359 Performed at [facility], [place] [address] [place], [place] [address]   Rhogam injection   Collection Time: 05/18/22  4:00 AM  Result Value Ref Range   Unit Number [id number]    Blood Component Type RHIG    Unit division 00    Status of Unit ISSUED,FINAL    Transfusion Status      OK TO TRANSFUSE Performed at [facility] [address], [place], [place] [address]   CBC   Collection Time: 05/18/22  6:45 AM  Result Value Ref Range   WBC 9.8 4.0 - 10.5 K/uL   RBC 4.17 3.87 - 5.11 MIL/uL   Hemoglobin 10.8 (L) 12.0 - 15.0 g/dL   HCT 32.4 (L) 36.0 - 46.0 %   MCV 77.7 (L) 80.0 - 100.0 fL   MCH 25.9 (L) 26.0 - 34.0 pg   MCHC 33.3 30.0 - 36.0 g/dL   RDW 15.4 11.5 - 15.5 %   Platelets 212 150 - 400 K/uL   nRBC 0.0 0.0 - 0.2 %  RPR   Collection Time: 05/18/22  6:45 AM  Result Value Ref Range   RPR Ser Ql NON REACTIVE NON REACTIVE  Rapid HIV screen (HIV 1/2 Ab+Ag)   Collection Time: 05/18/22  6:45 AM  Result Value Ref Range   HIV-1 P24 Antigen - HIV24 NON REACTIVE NON REACTIVE   HIV 1/2 Antibodies NON REACTIVE NON REACTIVE   Interpretation (HIV Ag Ab)      A non reactive test result means that HIV 1 or HIV 2 antibodies and HIV 1 p24 antigen were not detected in the specimen.  Rh IG workup (includes ABO/Rh)   Collection Time: 05/18/22  9:19 AM  Result Value Ref Range   Gestational Age(Wks)      23 Performed at [facility] [address] [place], [place] [address]   Rubella screen   Collection Time: 05/18/22  4:09 PM  Result Value Ref Range   Rubella 8.85 Immune >0.99 index  Toxoplasma gondii antibody, IgG   Collection Time: 05/18/22  4:09 PM  Result Value Ref Range   Toxoplasma IgG Ratio <3.0 0.0 - 7.1 IU/mL  Cmv antibody, IgG (EIA)   Collection Time: 05/18/22  4:09 PM  Result Value Ref Range   CMV Ab - IgG >10.00 (H) 0.00 - 0.59 U/mL  HSV 1 antibody, IgG   Collection Time: 05/18/22  4:09 PM  Result Value Ref Range   HSV 1  Glycoprotein G Ab, IgG <0.91 0.00 - 0.90 index  HSV 2 antibody,  IgG   Collection Time: 05/18/22  4:09 PM  Result Value Ref Range   HSV 2 Glycoprotein G Ab, IgG 13.10 (H) 0.00 - 0.90 index  Parvovirus B19 antibody, IgG and IgM   Collection Time: 05/18/22  4:09 PM  Result Value Ref Range   Parovirus B19 IgG Abs 4.3 (H) 0.0 - 0.8 index   Parovirus B19 IgM Abs 0.2 0.0 - 0.8 index  Amnisure rupture of membrane (rom)not at [facility]   Collection Time: 05/21/22  9:10 AM  Result Value Ref Range   Amnisure ROM NEGATIVE    [place] MFM OB LIMITED  Result Date: 05/22/2022 ----------------------------------------------------------------------  OBSTETRICS REPORT                       (Signed Final 05/22/2022 01:18 pm) ---------------------------------------------------------------------- Patient Info  ID #:       [id number]                          D.O.B.:  [birth year] (21 yrs)  Name:       Melissa [name] [facility]-                Visit Date: 05/22/2022 09:31 am              Melissa [name] ---------------------------------------------------------------------- Performed By  Attending:        Johnell Comings MD         Ref. Address:     [place]                                                             [place]  Performed By:     Valda Favia          Location:         [facility]                    [place]  Referred By:  [facility] ---------------------------------------------------------------------- Orders  #  Description                           Code        Ordered By  1  [place] MFM OB LIMITED                     [id number]    CHARLIE PICKENS ----------------------------------------------------------------------  #  Order #                     Accession #                Episode #  1   [id number]                   [id number]                 [id number] ---------------------------------------------------------------------- Indications  Elevated MSAFP (MoM 4.1)                       O28.9  Oligohydraminios, second trimester,            O41.02X0  unspecified  Premature rupture of membranes - leaking       O42.90  fluid (+amnisure)  [redacted] weeks gestation of pregnancy                Z3A.24  History of sickle cell trait (GC 05-12-21)      Z86.2  Rh negative state in antepartum                O36.0190  Previous cesarean delivery, antepartum         O34.219  Seizure disorder (Last one 2015-on meds)       O99.350 G40.909  Herpes simplex virus (HSV)                     O98.519 B00.9  Asthma                                         O99.89 j45.909  Short interval between pregancies, 2nd         O09.892  trimester  Obesity complicating pregnancy, second         O99.212  trimester ---------------------------------------------------------------------- Fetal Evaluation  Num Of Fetuses:         1  Fetal Heart Rate(bpm):  143  Cardiac Activity:       Observed  Presentation:           Breech  Placenta:               Anterior  P. Cord Insertion:      Previously Visualized  Amniotic Fluid  AFI FV:      Oligohydramnios                              Largest Pocket(cm)                              1.3 ---------------------------------------------------------------------- OB History  Gravidity:    4         Term:   2        Prem:   0  SAB:   1  TOP:          0       Ectopic:  0        Living: 2 ---------------------------------------------------------------------- Gestational Age  LMP:           24w 4d        Date:  12/01/21                  EDD:   09/07/22  Clinical EDD:  24w 2d                                        EDD:   09/09/22  Best:          24w 2d     Det. By:  Clinical EDD             EDD:   09/09/22 ---------------------------------------------------------------------- Anatomy  Cranium:               Appears normal          Kidneys:                Appear normal  Ventricles:            Appears normal         Bladder:                Appears normal  Thoracic:              Appears normal ---------------------------------------------------------------------- Doppler - Fetal Vessels  Umbilical Artery   S/D     %tile      RI    %tile      PI    %tile     PSV    ADFV    RDFV                                                     (cm/s)    2.4        5     0.6        6     0.8    < 2.5     24.7      No      No ---------------------------------------------------------------------- Cervix Uterus Adnexa  Cervix  Not visualized (advanced GA >24wks)  Uterus  No abnormality visualized.  Right Ovary  No adnexal mass visualized.  Left Ovary  No adnexal mass visualized.  Cul De Sac  No free fluid seen.  Adnexa  No abnormality visualized. ---------------------------------------------------------------------- Comments  Kurt Azimi is a 21 year old gravida 4 para 2  currently at 24 weeks and 2 days.  She was seen in  consultation at the request of Dr. Ilda Basset due to  oligohydramnios.  The patient's pregnancy has also been complicated by an  unexplained elevated MSAFP of 4.1 MoM.  Her amniotic fluid  level was noted to be decreased during her fetal anatomy  scan performed 2 weeks ago.  However, a 2.2 cm pocket of  amniotic fluid was present during that exam.  The patient was admitted 3 days ago due to possible PPROM  when she presented to the MAU complaining of bleeding and  leakage of fluid.  An AmniSure test performed at  the time of  admission was positive.  Due to PPROM, she was given a complete course of  antenatal corticosteroids and placed on magnesium sulfate  for fetal neuroprotection.  The patient was treated for both a trichomonas infection and  a gonorrhea infection.  She reports that she is no longer  leaking fluid.  Her AmniSure test performed yesterday was  negative.  An ultrasound performed this morning shows  oligohydramnios with  only a 1.3 cm  pocket of cord free fluid  present.  Fetal movements including fetal breathing  movements were noted on today's exam.  The fetus is in the  breech presentation.  Doppler studies of the umbilical arteries  performed today continues to show normal forward flow.  There were no signs of absent or reversed end-diastolic flow.  The patient was advised that the oligohydramnios noted on  her ultrasound exams may be a sign of rupture of  membranes or it may indicate placental dysfunction as  evidenced by her elevated MSAFP level.  Two normal-appearing fetal kidneys including a normal-  appearing filled fetal bladder were noted on today's  ultrasound exam indicating that renal agenesis is not the  cause of her low amniotic fluid levels.  I discussed her case with Dr. Elgie Congo.  He will perform a  speculum exam to ensure that there is no ferning to indicate  that she ruptured membranes.  If rupture of membranes is ruled out, the patient may be  discharged home.  We will continue to follow her with weekly ultrasound exams  for fetal and amniotic fluid assessments.  We will start twice weekly NSTs at around 28 weeks.  Should oligohydramnios continue to be noted with reassuring  fetal testing, delivery should be considered at around 34  weeks.  She should be given a rescue course of steroids prior to  delivery at 34 weeks.  The patient understands that due to oligohydramnios, there is  an increased risk of a fetal demise.  She was advised to drink  plenty of fluids and to monitor fetal movements daily.  Hopefully, with increased fetal surveillance, we can help her  further modify the risk for a demise.  The patient is requesting to be discharged home as she has  2 young kids at home who need her care.  I will have someone from our office contact her to schedule  another ultrasound later this week. ----------------------------------------------------------------------                   Johnell Comings, MD Electronically  Signed Final Report   05/22/2022 01:18 pm ----------------------------------------------------------------------  [place] MFM OB FOLLOW UP  Result Date: 05/18/2022 ----------------------------------------------------------------------  OBSTETRICS REPORT                       (Signed Final 05/18/2022 03:11 pm) ---------------------------------------------------------------------- Patient Info  ID #:       [id number]                          D.O.B.:  22-Jun-2001 (21 yrs)  Name:       Melissa [name] [facility]-                Visit Date: 05/17/2022 10:15 pm              Melissa [name] ---------------------------------------------------------------------- Performed By  Attending:        Sander Nephew      Ref. Address:  [place]                                                             [place]  Performed By:     Hubert Azure          Location:         [facility]                    [place]  Referred By:      [place] ---------------------------------------------------------------------- Orders  #  Description                           Code        Ordered By  1  [place] MFM OB FOLLOW UP                   [phone]    Hansel Feinstein ----------------------------------------------------------------------  #  Order #                     Accession #                Episode #  1  [id number]                   [id number]                 [id number] ---------------------------------------------------------------------- Indications  Vaginal bleeding in pregnancy, second          O46.92  trimester  Premature rupture of membranes - leaking       O42.90  fluid (+amnisure)  Elevated MSAFP (MoM 4.1)                       O28.9  History of sickle cell trait (GC 05-12-21)  Z86.2  Rh negative state in antepartum                O36.0190  Previous cesarean delivery, antepartum         O34.219  Seizure disorder (Last one 2015-on meds)       O99.350 G40.909  Herpes simplex virus (HSV)                     O98.519 B00.9  Asthma                                         O99.89 j45.909  Short interval between pregancies, 2nd         O09.892  trimester  Obesity complicating pregnancy, second         O99.212  trimester  Oligohydraminios, second trimester,            O41.02X0  unspecified  [redacted] weeks gestation of pregnancy                Z3A.23 ---------------------------------------------------------------------- Fetal Evaluation  Num Of Fetuses:         1  Fetal Heart Rate(bpm):  143  Cardiac Activity:       Observed  Presentation:           Cephalic  Placenta:               Anterior  P. Cord Insertion:      Visualized  Amniotic Fluid  AFI FV:      Oligohydramnios                              Largest Pocket(cm)                              1.2 ---------------------------------------------------------------------- Biometry  BPD:      62.5  mm     G. Age:  25w 2d         94  %    CI:        80.98   %    70 - 86                                                          FL/HC:      19.2   %    18.7 - 20.9  HC:      219.3  mm     G. Age:  24w 0d         49  %    HC/AC:      1.02        1.05 - 1.21  AC:      214.5  mm     G. Age:  26w 0d         96  %    FL/BPD:     67.4   %    71 - 87  FL:       42.1  mm     G. Age:  23w 5d         43  %    FL/AC:  19.6   %    20 - 24  HUM:      36.3  mm     G. Age:  22w 5d         21  %  Est. FW:     750  gm    1 lb 10 oz      94  % ---------------------------------------------------------------------- OB History  Gravidity:    4         Term:   2        Prem:   0        SAB:   1  TOP:          0       Ectopic:  0        Living: 2 ---------------------------------------------------------------------- Gestational Age  LMP:           23w 6d        Date:  12/01/21                   EDD:   09/07/22  Clinical EDD:  23w 4d                                        EDD:   09/09/22  U/S Today:     24w 5d                                        EDD:   09/01/22  Best:          23w 4d     Det. By:  Clinical EDD             EDD:   09/09/22 ---------------------------------------------------------------------- Anatomy  Cranium:               Appears normal         LVOT:                   Appears normal  Cavum:                 Previously seen        Aortic Arch:            Appears normal  Ventricles:            Previously seen        Ductal Arch:            Appears normal  Choroid Plexus:        Previously seen        Diaphragm:              Appears normal  Cerebellum:            Previously seen        Stomach:                Appears normal, left  sided  Posterior Fossa:       Previously seen        Abdomen:                Appears normal  Nuchal Fold:           Not applicable (>16    Abdominal Wall:         Previously seen                         wks GA)  Face:                  Orbits previously      Cord Vessels:           Previously seen                         seen  Lips:                  Previously seen        Kidneys:                Appear normal  Palate:                Not well visualized    Bladder:                Appears normal  Thoracic:              Appears normal         Spine:                  Limited views                                                                        previously seen  Heart:                 Not well visualized    Upper Extremities:      Not well visualized  RVOT:                  Appears normal         Lower Extremities:      Not well visualized  Other:  Gender not well visualized. Technically difficult due to fetal position          and low fluid. ---------------------------------------------------------------------- Cervix Uterus Adnexa  Cervix  Length:            3.4  cm.  Normal appearance  by transabdominal scan.  Uterus  No abnormality visualized.  Right Ovary  Within normal limits.  Left Ovary  Within normal limits.  Adnexa  No abnormality visualized. ---------------------------------------------------------------------- Impression  Single intrauterine pregnancy here for a detailed anatomy  suspected premature rupture of membranes in the context of  an elevated AFP of 4.1 MoM  Normal anatomy with measurements consistent with dates  There is good fetal movement and oligohydramnios.  Suboptimal views of the fetal anatomy were obtained  secondary to fetal position due to oligohydramnios.  Today we also observed suspected pericardial effusion with a  fluid collection between 4-5 mm in diastole.  Recommend consider fetal  echocardiogram and TORCH  including parvo virus  A prior consultation was performed given the elevated AFP,  she has a low risk NIPS, she recieved rhogam and had a  normal TSH at the beginning of pregnancy. ---------------------------------------------------------------------- Recommendations  Follow up growth in 4 weeks  Limited exam of the pericardial effusion in 1-2 weeks  Fetal echocardiogram ----------------------------------------------------------------------              Sander Nephew, MD Electronically Signed Final Report   05/18/2022 03:11 pm ----------------------------------------------------------------------  [place] MFM OB DETAIL +14 WK  Result Date: 05/03/2022 ----------------------------------------------------------------------  OBSTETRICS REPORT                       (Signed Final 05/03/2022 03:44 pm) ---------------------------------------------------------------------- Patient Info  ID #:       [id number]                          D.O.B.:  04-16-2001 (21 yrs)  Name:       Melissa [name] [facility]-                Visit Date: 05/03/2022 12:48 pm              Melissa [name] ---------------------------------------------------------------------- Performed By  Attending:        Tama High  MD        Ref. Address:     [place]                                                             [place]  Performed By:     Dorena Dew     [facility]  Referred By:      [place] ---------------------------------------------------------------------- Orders  #  Description                           Code        Ordered By  1  [place] MFM OB DETAIL +14 WK               86767.20    Arlina Robes ----------------------------------------------------------------------  #  Order #                     Accession #                Episode #  1  [id number]                   [id number]                 [id number] ---------------------------------------------------------------------- Indications  Elevated MSAFP (MoM 4.1)                       O28.9  [redacted] weeks gestation of pregnancy                Z3A.21  History of sickle cell trait (GC 05-12-21)      Z86.2  Rh negative state in antepartum                O36.0190  Previous cesarean delivery, antepartum         O34.219  Seizure disorder (Last one 2015-on meds)       O99.350 G40.909  Herpes simplex virus (HSV)                     O98.519 B00.9  Asthma                                         O99.89 j45.909  Short interval between pregancies, 2nd         O09.892  trimester  Obesity complicating pregnancy, second         O99.212  trimester ---------------------------------------------------------------------- Fetal Evaluation  Num Of Fetuses:         1  Fetal Heart Rate(bpm):  157  Cardiac Activity:       Observed  Presentation:            Cephalic  Placenta:               Anterior  P. Cord Insertion:      Visualized, central  Amniotic Fluid  AFI FV:      Decreased                              Largest Pocket(cm)                              2.2 ---------------------------------------------------------------------- Biometry  BPD:     52.19  mm     G. Age:  21w 6d         59  %    CI:        72.83   %    70 - 86                                                          FL/HC:      19.7   %    18.4 - 20.2  HC:    194.44   mm     G. Age:  21w 5d         43  %    HC/AC:      1.12        1.06 - 1.25  AC:    173.48   mm     G. Age:  22w 2d         66  %    FL/BPD:     73.3   %    71 - 87  FL:      38.23  mm     G. Age:  22w 2d         64  %    FL/AC:      22.0   %    20 - 24  HUM:      34.7  mm     G. Age:  21w 6d         56  %  CER:      24.1  mm     G. Age:  22w 1d         84  %  NFT:       6.3  mm  LV:        4.7  mm  CM:        7.3  mm  Est. FW:     483  gm      1 lb 1 oz     76  % ---------------------------------------------------------------------- OB History  Gravidity:    4         Term:   2        Prem:   0        SAB:   1  TOP:          0       Ectopic:  0        Living: 2 ---------------------------------------------------------------------- Gestational Age  LMP:           21w 6d        Date:  12/01/21                  EDD:   09/07/22  Clinical EDD:  48A 4d                                        EDD:   09/09/22  U/S Today:     22w 0d  EDD:   09/06/22  Best:          Audrea Muscat 4d     Det. By:  Clinical EDD             EDD:   09/09/22 ---------------------------------------------------------------------- Anatomy  Cranium:               Appears normal         Aortic Arch:            Appears normal  Cavum:                 Appears normal         Ductal Arch:            Not well visualized  Ventricles:            Appears normal         Diaphragm:              Appears normal  Choroid Plexus:        Appears normal          Stomach:                Appears normal, left                                                                        sided  Cerebellum:            Appears normal         Abdomen:                Appears normal  Posterior Fossa:       Appears normal         Abdominal Wall:         Appears nml (cord                                                                        insert, abd wall)  Face:                  Orbits appear          Cord Vessels:           Appears normal (3                         normal                                         vessel cord)  Lips:                  Appears normal         Kidneys:                Appear normal  Palate:  Not well visualized    Bladder:                Appears normal  Thoracic:              Appears normal         Spine:                  Limited views                                                                        appear normal  Heart:                 Not well visualized    Upper Extremities:      Not well visualized  RVOT:                  Appears normal         Lower Extremities:      Not well visualized  LVOT:                  Appears normal  Other:  Gender not well visualized. Technically difficult due to fetal position          and low fluid. ---------------------------------------------------------------------- Cervix Uterus Adnexa  Cervix  Length:            2.6  cm.  Normal appearance by transabdominal scan.  Right Ovary  Size(cm)     3.08   x   2.65   x  1.93      Vol(ml): 8.25  Within normal limits.  Left Ovary  Not visualized. ---------------------------------------------------------------------- Impression  We performed a fetal anatomical survey.  Amniotic fluid is  considerably decreased and the maximum vertical pocket  measures 2 cm.  Fetal biometry is consistent with the  previously established dates.  No markers of aneuploidies or  obvious fetal structural defects are seen.  Intracranial  structures and abdominal wall appear normal.  The  spine  appears normal but limited evaluation because of low  amniotic fluid.  Placenta appears normal and there is no  evidence of previa or placenta accreta spectrum.  xxxxxxxxxxxxxxxxxxxxxxxxxxxxxxxxxxxxxxxxxxxxxxxxx  Consultation (see EPIC )  I had the pleasure of seeing Ms. Giancola today at the  [facility]. She is G4 P2012 at 21w 4d  gestation and is here for fetal anatomy scan and genetic  counseling.  Increased MSAFP was seen on screening (4.1 MoM) and the  risk for open neural tube defect was increased at 1 and 45.  Patient does not give history of vaginal bleeding.  On cell-free fetal Linea screening, the risks of fetal  aneuploidies are not increased.  Obstetric history  -2020: Term vaginal delivery of a female infant weighing 7  pounds and 2 ounces at birth.  Her daughter is in good health.  -08/2021: Term cesarean delivery (genital herpes) of a  female infant weighing 8 pounds at birth.  Her daughter is in  good health.  She had 1 spontaneous miscarriage in 2022.  GYN history: No history of abnormal Pap smears or cervical  surgeries.  Past medical history: No history of hypertension or diabetes  or any chronic medical conditions.  She has sickle cell trait  and her partner's carrier status is not known.  Past surgical history: Cesarean section, appendectomy,  intestinal malrotation repair.  Allergies: Benadryl (seizures), shellfish, Zithromax, Haldol,  latex (see epic for details).  Social history: Denies drug or alcohol use.  She reports she  smokes 1 cigarette daily.  Her partner is African-American  and he is not involved in this pregnancy.  Family history: No history of venous thromboembolism in the  family.  Increased risk for open-neural tube defects and increased  AFP  I reassured the patient of normal fetal anatomy on ultrasound  including spine and intracranial structures. I informed her that  ultrasound can detect up to 95 out of 100 cases of spina  bifida and that  amniocentesis will only marginally improve the  detection rate.  Increased AFP can also be associated with fetal growth  restriction (placental insufficiency) and preterm delivery. Low  amniotic fluid is associated with placental insufficiency and  stillbirth is associated with increased AFP in some cases.  I recommended serial fetal growth assessments.  Patient met with our genetic counselor after ultrasound. See  separate letter from our genetic counselor. ---------------------------------------------------------------------- Recommendations  -An appointment was made for her to return in 3 weeks for  fetal growth and amniotic fluid assessments.  -I advised the patient to call your office if she complains of  decreased fetal movements. ----------------------------------------------------------------------                 Tama High, MD Electronically Signed Final Report   05/03/2022 03:44 pm ----------------------------------------------------------------------   Future Appointments  Date Time Provider [place]  05/26/2022  1:45 PM [facility] NURSE [facility]  05/26/2022  2:00 PM [facility] US1 [facility]  06/01/2022  4:10 PM Laury Deep, CNM [facility] None  06/09/2022  1:45 PM [facility] NURSE [facility]  06/09/2022  2:00 PM [facility] US1 [facility]  06/16/2022  2:15 PM [facility] NURSE [facility]  06/16/2022  2:30 PM [facility] US3 [facility]  06/20/2022  2:30 PM [facility] NURSE [facility]  06/20/2022  2:45 PM [facility] US6 [facility]  06/20/2022  4:00 PM [facility] NST [facility]  06/23/2022  2:00 PM [facility] NURSE [facility]  06/23/2022  2:15 PM [facility] NST [facility]  06/27/2022  2:30 PM [facility] NURSE [facility]  06/27/2022  2:45 PM [facility] US5 [facility]  06/27/2022  4:00 PM [facility] NST [facility]  06/30/2022  2:00 PM [facility] NURSE [facility]  06/30/2022  2:15 PM [facility] NST [facility]    Discharge Condition: Stable  Discharge disposition: 01-Home or Self  Care       Discharge Instructions     Discharge patient   Complete by: As directed    Discharge disposition: 01-Home or Self Care   Discharge patient date: 05/22/2022      Allergies as of 05/22/2022       Reactions   Benadryl [diphenhydramine Hcl] Other (See Comments)   Causes seizures   Gluten Meal Nausea And Vomiting   Shellfish Allergy Anaphylaxis   Patient states her throat gets tight   Zithromax [azithromycin] Anaphylaxis, Swelling   Haldol [haloperidol] Swelling   Tongue swelling.   Lactose Intolerance (gi) Diarrhea   Latex Swelling   Labial edema after usage of latex condoms.         Medication List     TAKE these medications    Blood Pressure Kit Devi 1 kit by Does not apply route once a week.  ferrous sulfate 325 (65 FE) MG tablet Take 1 tablet (325 mg total) by mouth at bedtime.   Gojji Weight Scale Misc 1 Device by Does not apply route every 30 (thirty) days.   hydrOXYzine 25 MG tablet Commonly known as: ATARAX Take 1 tablet (25 mg total) by mouth 3 (three) times daily as needed for anxiety.   hydrOXYzine 25 MG tablet Commonly known as: ATARAX Take 1 tablet (25 mg total) by mouth 3 (three) times daily as needed for anxiety.   PrePLUS 27-1 MG Tabs Take 1 tablet by mouth daily.   terconazole 0.4 % vaginal cream Commonly known as: TERAZOL 7 Place 1 applicator vaginally at bedtime.   valACYclovir 1000 MG tablet Commonly known as: VALTREX Take 1 tablet (1,000 mg total) by mouth 2 (two) times daily.        Follow-up Information     [facility] Follow up in 2 week(s).   Why: They will call you with an appointment Contact information: [address] [phone] [place] [facility] at [facility] Follow up in 1 week(s).   Specialty: Maternal and Fetal Medicine Why: They will call you with an appointment Contact information: [address]  [address] [place] [address] [phone]                Total discharge time: 20 minutes   Signed: Griffin Basil M.D. 05/23/2022, 4:10 PM

## 2022-05-26 ENCOUNTER — Ambulatory Visit: Payer: Medicaid Other | Attending: Obstetrics

## 2022-05-26 ENCOUNTER — Other Ambulatory Visit: Payer: Self-pay | Admitting: *Deleted

## 2022-05-26 ENCOUNTER — Ambulatory Visit: Payer: Medicaid Other | Admitting: *Deleted

## 2022-05-26 VITALS — BP 110/68 | HR 89

## 2022-05-26 DIAGNOSIS — O4100X0 Oligohydramnios, unspecified trimester, not applicable or unspecified: Secondary | ICD-10-CM | POA: Diagnosis present

## 2022-05-26 DIAGNOSIS — O280 Abnormal hematological finding on antenatal screening of mother: Secondary | ICD-10-CM

## 2022-05-26 DIAGNOSIS — O0990 Supervision of high risk pregnancy, unspecified, unspecified trimester: Secondary | ICD-10-CM | POA: Insufficient documentation

## 2022-05-26 DIAGNOSIS — O09899 Supervision of other high risk pregnancies, unspecified trimester: Secondary | ICD-10-CM | POA: Diagnosis present

## 2022-05-26 DIAGNOSIS — O23592 Infection of other part of genital tract in pregnancy, second trimester: Secondary | ICD-10-CM | POA: Diagnosis present

## 2022-05-26 DIAGNOSIS — A5901 Trichomonal vulvovaginitis: Secondary | ICD-10-CM | POA: Diagnosis present

## 2022-05-28 ENCOUNTER — Encounter (HOSPITAL_COMMUNITY): Payer: Self-pay | Admitting: Obstetrics and Gynecology

## 2022-05-28 ENCOUNTER — Inpatient Hospital Stay (HOSPITAL_BASED_OUTPATIENT_CLINIC_OR_DEPARTMENT_OTHER): Payer: Medicaid Other

## 2022-05-28 ENCOUNTER — Inpatient Hospital Stay (HOSPITAL_COMMUNITY)
Admission: AD | Admit: 2022-05-28 | Discharge: 2022-06-08 | DRG: 786 | Disposition: A | Discharge status: Home / Self Care | Payer: Medicaid Other | Attending: Obstetrics and Gynecology | Admitting: Obstetrics and Gynecology

## 2022-05-28 ENCOUNTER — Other Ambulatory Visit: Payer: Self-pay

## 2022-05-28 DIAGNOSIS — O99354 Diseases of the nervous system complicating childbirth: Secondary | ICD-10-CM | POA: Diagnosis present

## 2022-05-28 DIAGNOSIS — Z6791 Unspecified blood type, Rh negative: Secondary | ICD-10-CM | POA: Diagnosis not present

## 2022-05-28 DIAGNOSIS — Z87820 Personal history of traumatic brain injury: Secondary | ICD-10-CM | POA: Diagnosis not present

## 2022-05-28 DIAGNOSIS — Z23 Encounter for immunization: Secondary | ICD-10-CM

## 2022-05-28 DIAGNOSIS — O8612 Endometritis following delivery: Secondary | ICD-10-CM | POA: Diagnosis not present

## 2022-05-28 DIAGNOSIS — O26892 Other specified pregnancy related conditions, second trimester: Secondary | ICD-10-CM | POA: Diagnosis present

## 2022-05-28 DIAGNOSIS — F419 Anxiety disorder, unspecified: Secondary | ICD-10-CM | POA: Diagnosis present

## 2022-05-28 DIAGNOSIS — Z3A25 25 weeks gestation of pregnancy: Secondary | ICD-10-CM

## 2022-05-28 DIAGNOSIS — A5901 Trichomonal vulvovaginitis: Secondary | ICD-10-CM

## 2022-05-28 DIAGNOSIS — D573 Sickle-cell trait: Secondary | ICD-10-CM | POA: Diagnosis present

## 2022-05-28 DIAGNOSIS — O4100X0 Oligohydramnios, unspecified trimester, not applicable or unspecified: Secondary | ICD-10-CM | POA: Diagnosis not present

## 2022-05-28 DIAGNOSIS — G40109 Localization-related (focal) (partial) symptomatic epilepsy and epileptic syndromes with simple partial seizures, not intractable, without status epilepticus: Secondary | ICD-10-CM | POA: Diagnosis present

## 2022-05-28 DIAGNOSIS — O09892 Supervision of other high risk pregnancies, second trimester: Secondary | ICD-10-CM

## 2022-05-28 DIAGNOSIS — O411220 Chorioamnionitis, second trimester, not applicable or unspecified: Secondary | ICD-10-CM | POA: Diagnosis present

## 2022-05-28 DIAGNOSIS — O9832 Other infections with a predominantly sexual mode of transmission complicating childbirth: Secondary | ICD-10-CM | POA: Diagnosis present

## 2022-05-28 DIAGNOSIS — O4102X0 Oligohydramnios, second trimester, not applicable or unspecified: Secondary | ICD-10-CM | POA: Diagnosis present

## 2022-05-28 DIAGNOSIS — O42912 Preterm premature rupture of membranes, unspecified as to length of time between rupture and onset of labor, second trimester: Secondary | ICD-10-CM | POA: Diagnosis present

## 2022-05-28 DIAGNOSIS — O42012 Preterm premature rupture of membranes, onset of labor within 24 hours of rupture, second trimester: Secondary | ICD-10-CM | POA: Diagnosis not present

## 2022-05-28 DIAGNOSIS — O4103X0 Oligohydramnios, third trimester, not applicable or unspecified: Secondary | ICD-10-CM

## 2022-05-28 DIAGNOSIS — O283 Abnormal ultrasonic finding on antenatal screening of mother: Secondary | ICD-10-CM | POA: Diagnosis present

## 2022-05-28 DIAGNOSIS — O9902 Anemia complicating childbirth: Secondary | ICD-10-CM | POA: Diagnosis present

## 2022-05-28 DIAGNOSIS — O42919 Preterm premature rupture of membranes, unspecified as to length of time between rupture and onset of labor, unspecified trimester: Secondary | ICD-10-CM | POA: Diagnosis present

## 2022-05-28 DIAGNOSIS — O0990 Supervision of high risk pregnancy, unspecified, unspecified trimester: Secondary | ICD-10-CM

## 2022-05-28 DIAGNOSIS — Z3A26 26 weeks gestation of pregnancy: Secondary | ICD-10-CM | POA: Diagnosis not present

## 2022-05-28 DIAGNOSIS — F17210 Nicotine dependence, cigarettes, uncomplicated: Secondary | ICD-10-CM | POA: Diagnosis present

## 2022-05-28 DIAGNOSIS — O99334 Smoking (tobacco) complicating childbirth: Secondary | ICD-10-CM | POA: Diagnosis present

## 2022-05-28 DIAGNOSIS — A6000 Herpesviral infection of urogenital system, unspecified: Secondary | ICD-10-CM | POA: Diagnosis present

## 2022-05-28 DIAGNOSIS — Z20822 Contact with and (suspected) exposure to covid-19: Secondary | ICD-10-CM | POA: Diagnosis present

## 2022-05-28 DIAGNOSIS — O34211 Maternal care for low transverse scar from previous cesarean delivery: Secondary | ICD-10-CM | POA: Diagnosis present

## 2022-05-28 DIAGNOSIS — O4592 Premature separation of placenta, unspecified, second trimester: Secondary | ICD-10-CM | POA: Diagnosis present

## 2022-05-28 DIAGNOSIS — N898 Other specified noninflammatory disorders of vagina: Secondary | ICD-10-CM

## 2022-05-28 DIAGNOSIS — O4292 Full-term premature rupture of membranes, unspecified as to length of time between rupture and onset of labor: Secondary | ICD-10-CM

## 2022-05-28 DIAGNOSIS — Z98891 History of uterine scar from previous surgery: Secondary | ICD-10-CM | POA: Diagnosis not present

## 2022-05-28 DIAGNOSIS — O4692 Antepartum hemorrhage, unspecified, second trimester: Secondary | ICD-10-CM | POA: Diagnosis present

## 2022-05-28 DIAGNOSIS — O42013 Preterm premature rupture of membranes, onset of labor within 24 hours of rupture, third trimester: Secondary | ICD-10-CM | POA: Diagnosis not present

## 2022-05-28 DIAGNOSIS — O99344 Other mental disorders complicating childbirth: Secondary | ICD-10-CM | POA: Diagnosis present

## 2022-05-28 DIAGNOSIS — O4690 Antepartum hemorrhage, unspecified, unspecified trimester: Secondary | ICD-10-CM | POA: Diagnosis not present

## 2022-05-28 DIAGNOSIS — O99892 Other specified diseases and conditions complicating childbirth: Secondary | ICD-10-CM | POA: Diagnosis not present

## 2022-05-28 DIAGNOSIS — O23592 Infection of other part of genital tract in pregnancy, second trimester: Secondary | ICD-10-CM

## 2022-05-28 DIAGNOSIS — O411230 Chorioamnionitis, third trimester, not applicable or unspecified: Secondary | ICD-10-CM | POA: Diagnosis not present

## 2022-05-28 DIAGNOSIS — O26899 Other specified pregnancy related conditions, unspecified trimester: Secondary | ICD-10-CM

## 2022-05-28 DIAGNOSIS — Z8619 Personal history of other infectious and parasitic diseases: Secondary | ICD-10-CM | POA: Diagnosis present

## 2022-05-28 HISTORY — DX: Other complications of anesthesia, initial encounter: T88.59XA

## 2022-05-28 LAB — AMNISURE RUPTURE OF MEMBRANE (ROM) NOT AT ARMC: Amnisure ROM: POSITIVE

## 2022-05-28 LAB — TYPE AND SCREEN
ABO/RH(D): O NEG
Antibody Screen: POSITIVE

## 2022-05-28 LAB — CBC
HCT: 32.1 % — ABNORMAL LOW (ref 36.0–46.0)
Hemoglobin: 10.4 g/dL — ABNORMAL LOW (ref 12.0–15.0)
MCH: 25.9 pg — ABNORMAL LOW (ref 26.0–34.0)
MCHC: 32.4 g/dL (ref 30.0–36.0)
MCV: 79.9 fL — ABNORMAL LOW (ref 80.0–100.0)
Platelets: 231 10*3/uL (ref 150–400)
RBC: 4.02 MIL/uL (ref 3.87–5.11)
RDW: 15.7 % — ABNORMAL HIGH (ref 11.5–15.5)
WBC: 10.2 10*3/uL (ref 4.0–10.5)
nRBC: 0 % (ref 0.0–0.2)

## 2022-05-28 MED ORDER — DOCUSATE SODIUM 100 MG PO CAPS
100.0000 mg | ORAL_CAPSULE | Freq: Two times a day (BID) | ORAL | Status: DC | PRN
  Administered 2022-06-03: 100 mg via ORAL
  Filled 2022-05-28 (×2): qty 1

## 2022-05-28 MED ORDER — INFLUENZA VAC SPLIT QUAD 0.5 ML IM SUSY
0.5000 mL | PREFILLED_SYRINGE | INTRAMUSCULAR | Status: AC
  Administered 2022-05-29: 0.5 mL via INTRAMUSCULAR
  Filled 2022-05-28: qty 0.5

## 2022-05-28 MED ORDER — VALACYCLOVIR HCL 500 MG PO TABS
500.0000 mg | ORAL_TABLET | Freq: Two times a day (BID) | ORAL | Status: DC
  Administered 2022-05-28 – 2022-06-08 (×22): 500 mg via ORAL
  Filled 2022-05-28 (×22): qty 1

## 2022-05-28 MED ORDER — PRENATAL MULTIVITAMIN CH
1.0000 | ORAL_TABLET | Freq: Every day | ORAL | Status: DC
  Administered 2022-05-29 – 2022-06-04 (×7): 1 via ORAL
  Filled 2022-05-28 (×8): qty 1

## 2022-05-28 MED ORDER — SODIUM CHLORIDE 0.9 % IV SOLN: 250.0000 mL | INTRAVENOUS | Status: DC | PRN

## 2022-05-28 MED ORDER — VALACYCLOVIR HCL 500 MG PO TABS: 1000.0000 mg | ORAL_TABLET | Freq: Two times a day (BID) | ORAL | Status: DC

## 2022-05-28 MED ORDER — CALCIUM CARBONATE ANTACID 500 MG PO CHEW
2.0000 | CHEWABLE_TABLET | ORAL | Status: DC | PRN
  Administered 2022-06-01 – 2022-06-03 (×2): 400 mg via ORAL
  Filled 2022-05-28 (×2): qty 2

## 2022-05-28 MED ORDER — SODIUM CHLORIDE 0.9% FLUSH
3.0000 mL | Freq: Two times a day (BID) | INTRAVENOUS | Status: DC
  Administered 2022-05-28 – 2022-06-04 (×13): 3 mL via INTRAVENOUS

## 2022-05-28 MED ORDER — SODIUM CHLORIDE 0.9% FLUSH: 3.0000 mL | INTRAVENOUS | Status: DC | PRN

## 2022-05-28 MED ORDER — ACETAMINOPHEN 325 MG PO TABS
650.0000 mg | ORAL_TABLET | ORAL | Status: DC | PRN
  Administered 2022-05-29 – 2022-06-04 (×12): 650 mg via ORAL
  Filled 2022-05-28 (×14): qty 2

## 2022-05-28 NOTE — H&P (Signed)
Melissa [name] is a 21 y.o. female [phone] at [redacted]w[redacted]d presenting for bright red vaginal bleeding and gush of fluid at 0900 on 05/28/22. She was admitted on 05/17/22 for vaginal bleeding with leaking fluid and had positive amnisure initially, negative amnisure and negative fern after admission.  [place] with oligohydramnios, AFI 3.1 on  05/26/22, repeated on admission on 05/28/22, and AFI unchanged at 3.0.  Amnisure again positive with pink fluid on 05/28/22.  Pt admitted for observation of bleeding and ? PPROM.    OB History     Gravida  4   Para  2   Term  2   Preterm      AB  1   Living  2      SAB      IAB      Ectopic      Multiple  0   Live Births  2          Past Medical History:  Diagnosis Date   Anemia    Anxiety    Asthma    inhaler. last attack June 2019   Congenital hydronephrosis 2001   Constipation    Depression    Episodic tension-type headache, not intractable 04/16/2015   Family history of adverse reaction to anesthesia    mom rushed to hospital from Dentist office   Genital herpes    HSV infection 08/11/2021   CONCERN for primary outbreak at [redacted]w[redacted]d >>see 12/20 office note    Low back strain, sequela 11/10/2020   Migraine without aura and without status migrainosus, not intractable 04/16/2015   PID (pelvic inflammatory disease)    Post-partum depression 11/17/2021   Reactive airway disease in pediatric patient    Rh negative state in antepartum period 02/05/2019   Seizures ([place])    last one in 2015 - on meds   Sickle cell trait ([place])    Status post primary low transverse cesarean section 08/31/2021   TBI (traumatic brain injury) ([place]) 2006   Past Surgical History:  Procedure Laterality Date   APPENDECTOMY     CESAREAN SECTION N/A 08/31/2021   Procedure: CESAREAN SECTION;  Surgeon: Gwynne Edinger, MD;  Location: [facility] LD [facility]S;  Service: Obstetrics;  Laterality: N/A;   HERNIA REPAIR     INTESTINAL MALROTATION REPAIR  2001   Family History:  family history includes Anxiety disorder in her mother; Depression in her mother; Diabetes in her mother; Hypertension in her mother; Kidney disease in her father; Obesity in her mother; Post-traumatic stress disorder in her mother; Schizophrenia in her father; Stroke in her mother. Social History:  reports that she has been smoking cigarettes. She has been smoking an average of .25 packs per day. She has never used smokeless tobacco. She reports that she does not currently use alcohol. She reports that she does not currently use drugs after having used the following drugs: Marijuana.     Maternal Diabetes: No Genetic Screening: Normal Maternal Ultrasounds/Referrals: Other: Fetal Ultrasounds or other Referrals:  None Maternal Substance Abuse:  No Significant Maternal Medications:  None Significant Maternal Lab Results:  None Number of Prenatal Visits:greater than 3 verified prenatal visits Other Comments:   Oligohydramnios  Review of Systems  Constitutional:  Negative for chills, fatigue and fever.  Eyes:  Negative for visual disturbance.  Respiratory:  Negative for shortness of breath.   Cardiovascular:  Negative for chest pain.  Gastrointestinal:  Negative for abdominal pain and vomiting.  Genitourinary:  Positive for vaginal bleeding and vaginal  discharge. Negative for difficulty urinating, dysuria, flank pain, pelvic pain and vaginal pain.  Neurological:  Negative for dizziness and headaches.  Psychiatric/Behavioral: Negative.     Maternal Medical History:  Reason for admission: Vaginal bleeding.  Leaking fluid, amnisure positive  Contractions: Frequency: rare.   Fetal activity: Perceived fetal activity is normal.   Prenatal Complications - Diabetes: none.   Dilation: Closed Effacement (%): Thick Exam by:: Edwyna Dangerfield [email or web address] Blood pressure (!) 101/55, pulse 86, temperature 98.8 F (37.1 C), resp. rate 17, last menstrual period 12/03/2021, SpO2 100 %, not currently  breastfeeding. Maternal Exam:  Uterine Assessment: Contraction strength is mild.  Contraction frequency is rare.  Cervix: Cervix evaluated by sterile speculum exam and digital exam.     Fetal Exam Fetal Monitor Review: Mode: ultrasound.   Baseline rate: 145.  Variability: moderate (6-25 bpm).   Pattern: accelerations present and no decelerations.   Fetal State Assessment: Category I - tracings are normal.   Physical Exam Vitals and nursing note reviewed.  Constitutional:      Appearance: She is well-developed.  Cardiovascular:     Rate and Rhythm: Normal rate.     Heart sounds: Normal heart sounds.  Pulmonary:     Effort: Pulmonary effort is normal.     Breath sounds: Normal breath sounds.  Abdominal:     Palpations: Abdomen is soft.  Genitourinary:    Comments: SSE with scant shiny fluid, no pooling with Valsalva, ferning negative Musculoskeletal:        General: Normal range of motion.     Cervical back: Normal range of motion.  Skin:    General: Skin is warm and dry.  Neurological:     Mental Status: She is alert and oriented to person, place, and time.  Psychiatric:        Behavior: Behavior normal.        Thought Content: Thought content normal.        Judgment: Judgment normal.     Prenatal labs: ABO, Rh: --/--/O NEG (09/27 2222) Antibody: NEG (09/27 2222) Rubella: 8.85 (09/28 1609) RPR: NON REACTIVE (09/28 0645)  HBsAg: Negative (08/18 1149)  HIV: NON REACTIVE (09/28 0645)  GBS: Negative/-- (12/20 1436)   Assessment/Plan: [id number]  at [redacted]w[redacted]d  admitted for vaginal bleeding Leaking fluid, amnisure positive S/P BMZ on 9/27 and 9/28  Consult Dr Vergie Living with assessment and findings Pt admitted [facility]  Continuous EFM Pad counts   [name] Leftwich-Kirby 05/28/2022, 6:00 PM

## 2022-05-28 NOTE — MAU Note (Signed)
1455 arrival via EMS from home with reports of vaginal bleeding since 0900 today. Reports it was bright red at first and "gushing" out. Reports it is pinker now. Pt states she was admitted for bleeding one week ago and was told it was due to infection. Pt states she did not start having pain until EMS arrived and it is in her lower abd. Reports positive fetal movement. Provider at bedside. IV removed from left hand that was started in route per EMS because majority of catheter was taped outside of the insertion site

## 2022-05-29 DIAGNOSIS — Z98891 History of uterine scar from previous surgery: Secondary | ICD-10-CM | POA: Diagnosis not present

## 2022-05-29 DIAGNOSIS — O4100X0 Oligohydramnios, unspecified trimester, not applicable or unspecified: Secondary | ICD-10-CM

## 2022-05-29 DIAGNOSIS — O4690 Antepartum hemorrhage, unspecified, unspecified trimester: Secondary | ICD-10-CM

## 2022-05-29 LAB — KLEIHAUER-BETKE STAIN
# Vials RhIg: 1
Fetal Cells %: 0 %
Quantitation Fetal Hemoglobin: 0 mL

## 2022-05-29 MED ORDER — TERBUTALINE SULFATE 1 MG/ML IJ SOLN
0.2500 mg | Freq: Once | INTRAMUSCULAR | Status: DC
  Filled 2022-05-29: qty 1

## 2022-05-29 MED ORDER — HYDROXYZINE HCL 50 MG PO TABS
25.0000 mg | ORAL_TABLET | Freq: Three times a day (TID) | ORAL | Status: DC | PRN
  Administered 2022-05-29 – 2022-06-08 (×15): 25 mg via ORAL
  Filled 2022-05-29 (×16): qty 1

## 2022-05-29 MED ORDER — LORAZEPAM 2 MG/ML IJ SOLN
1.0000 mg | Freq: Once | INTRAMUSCULAR | Status: AC
  Administered 2022-06-04: 1 mg via INTRAVENOUS
  Filled 2022-05-29 (×2): qty 1

## 2022-05-29 MED ORDER — LACTATED RINGERS IV BOLUS
500.0000 mL | Freq: Once | INTRAVENOUS | Status: AC
  Administered 2022-05-29: 500 mL via INTRAVENOUS

## 2022-05-29 MED ORDER — FAMOTIDINE 20 MG PO TABS
20.0000 mg | ORAL_TABLET | Freq: Every day | ORAL | Status: DC
  Administered 2022-05-29 – 2022-06-08 (×12): 20 mg via ORAL
  Filled 2022-05-29 (×12): qty 1

## 2022-05-29 MED ORDER — LACTATED RINGERS IV SOLN: INTRAVENOUS | Status: DC

## 2022-05-29 NOTE — Progress Notes (Signed)
Patient ID: Melissa [name], female   DOB: Nov 10, 2000, 21 y.o.   MRN: [id number] Melissa [name] is a 21 y.o. [phone] at [redacted]w[redacted]d by ultrasound admitted for Preterm labor, PROM, bleeding(poresumed marginal sinus abruption)  Subjective: Pt got up to BR had a gush of blood, which has not continued Also with increase pain presumed from contractions  Objective: BP 107/68 (BP Location: Right Arm)   Pulse 79   Temp 97.9 F (36.6 C) (Oral)   Resp 16   Ht 5\' 6"  (1.676 m)   Wt 106.6 kg   LMP 12/03/2021   SpO2 100%   BMI 37.93 kg/m  I/O last 3 completed shifts: In: 1000.4 [I.V.:1000.4] Out: -  No intake/output data recorded.  FHT:  FHR: 140s bpm, variability: moderate,  accelerations:  Present,  decelerations:  Present mild variables UC:   difficult to monitor, palpated SVE:   Dilation: Fingertip Effacement (%): Thick Exam by:: Dr. Elonda Husky  Labs: Lab Results  Component Value Date   WBC 10.2 05/28/2022   HGB 10.4 (L) 05/28/2022   HCT 32.1 (L) 05/28/2022   MCV 79.9 (L) 05/28/2022   PLT 231 05/28/2022    Assessment / Plan: [redacted]w[redacted]d PPROM, prolonged, S/P BMZ, Mg prophylaxis and latent antibiotics also with episodic bleeding, without definitive source, presumed to be marginal sinus abruption  Increase pain and bleeding this am without evidence of significant cervical change, confirmed vertex  SQ terb x 1 to see if that helps ettle the uterus    Florian Buff, MD 05/29/2022, 12:42 PM

## 2022-05-29 NOTE — Progress Notes (Signed)
Patient declines terbutaline.  She voices understanding about the intended therapeutic action of medication (decrease uterine contractions), but says she "just can't do it".  Patient unable to verbalize specific concerns or reasons for not taking medication.   Phoned Dr. Elonda Husky with above information.  No new orders received.

## 2022-05-29 NOTE — Progress Notes (Signed)
Patient removed EFM saying it was too tight.  Last tracing reactive and consistent with previous tracings of this baby.  Patient c/o seeing spots, feeling dizzy and nauseated.  Says she has been having this daily since previous admission.  VSS.  Patient ambulated to BR independently with steady gait.  Not presently having bleeding, just scant amount of yellow discharge.  Also says she is not feeling contractions right now.  Phoned Dr. Elonda Husky with above information.  Dr. Elonda Husky said we could discontinue monitoring for now and resume order of NST BID as long as symptoms do not restart.

## 2022-05-29 NOTE — Progress Notes (Signed)
Called to bedside due to abdominal pain.  Pt notes constant lower pelvic pain/cramping.  States she had pain in her upper abdomen yesterday and then noted the bleeding.  She still notes some light spotting, when she went to the bathroom, but not as much.  Notes good fetal movement.  Some nausea, no vomiting.  O: BP 103/67   Pulse 90   Temp 98.2 F (36.8 C) (Oral)   Resp 17   Ht 5\' 6"  (1.676 m)   Wt 106.6 kg   LMP 12/03/2021   SpO2 100%   BMI 37.93 kg/m   Gen: appears uncomfortable Abd: soft, no rebound, no guarding, gravid uterus-non-tender GU: deferred Ext: no edema, no calf tenderness bilaterally  FHT: 140bpm, moderate variability, no accels, no decels Toco: no contractions  A/P: 21yo [id number] @ [redacted]w[redacted]d admitted for vaginal bleeding and suspected PPROM  -FWB: appropriate for current gestational age -Abd pain: no contractions noted, toco repositioned, LR bolus, pepcid and tylenol ordered, will continue to monitor -continue with antepartum care as previously outlined  Janyth Pupa, DO Attending [place], [place] for [employer], [place]

## 2022-05-29 NOTE — Progress Notes (Signed)
FACULTY PRACTICE ANTEPARTUM(COMPREHENSIVE) NOTE  Melissa [name] is a 21 y.o. [id number] with Estimated Date of Delivery: 09/09/22   By  LMP [redacted]w[redacted]d  who is admitted for vaginal bleeding and questionable PPROM.    Fetal presentation is cephalic. Length of Stay:  1  Days  Date of admission:05/28/2022  Subjective: Feeling better this am, denies pelvic or abdominal pain.  Reports that she was able to get some rest overnight.  No acute complaints this am. Patient reports the fetal movement as active. Patient reports uterine contraction  activity as none. Patient reports  vaginal bleeding as  vaginal spotting still present . Patient describes fluid per vagina as None.  Vitals:  Blood pressure 103/67, pulse 90, temperature 98.2 F (36.8 C), temperature source Oral, resp. rate 17, height 5\' 6"  (1.676 m), weight 106.6 kg, last menstrual period 12/03/2021, SpO2 100 %, not currently breastfeeding. Vitals:   05/28/22 1516 05/28/22 1814 05/28/22 2034 05/29/22 0156  BP: (!) 101/55 112/71 108/67 103/67  Pulse: 86 84 84 90  Resp: 17 17 17 17   Temp: 98.8 F (37.1 C) 98.4 F (36.9 C) 98.2 F (36.8 C) 98.2 F (36.8 C)  TempSrc:  Oral Oral Oral  SpO2: 100% 100% 100% 100%  Weight:   106.6 kg   Height:   5\' 6"  (1.676 m)    Physical Examination:  General appearance - alert, well appearing, and in no distress Mental status - normal mood, behavior, speech, dress, motor activity, and thought processes Chest - CTAB Heart - normal rate and regular rhythm Abdomen - gravid, soft and non-tender, no rebound, no guarding Extremities - no edema, no calf tenderness bilaterally Skin - warm and dry   Fetal Monitoring:  Baseline: 135 bpm, Variability: moderate, Accelerations: +10x10 accels, and Decelerations: Absent    reactive  Labs:  Results for orders placed or performed during the hospital encounter of 05/28/22 (from the past 24 hour(s))  Amnisure rupture of membrane (rom)not at [facility]   Collection  Time: 05/28/22  4:48 PM  Result Value Ref Range   Amnisure ROM POSITIVE   Type and screen [place]   Collection Time: 05/28/22  5:36 PM  Result Value Ref Range   ABO/RH(D) O NEG    Antibody Screen POS    Sample Expiration 05/31/2022,2359    Antibody Identification      PASSIVELY ACQUIRED ANTI-D Performed at [facility], [address] [place], [place] [address]   Kleihauer-Betke stain   Collection Time: 05/28/22  5:43 PM  Result Value Ref Range   Fetal Cells % 0 %   Quantitation Fetal Hemoglobin 0.0000 mL   # Vials RhIg 1   CBC   Collection Time: 05/28/22  5:43 PM  Result Value Ref Range   WBC 10.2 4.0 - 10.5 K/uL   RBC 4.02 3.87 - 5.11 MIL/uL   Hemoglobin 10.4 (L) 12.0 - 15.0 g/dL   HCT 32.1 (L) 36.0 - 46.0 %   MCV 79.9 (L) 80.0 - 100.0 fL   MCH 25.9 (L) 26.0 - 34.0 pg   MCHC 32.4 30.0 - 36.0 g/dL   RDW 15.7 (H) 11.5 - 15.5 %   Platelets 231 150 - 400 K/uL   nRBC 0.0 0.0 - 0.2 %    Imaging Studies:    [place] MFM OB Limited  Result Date: 05/28/2022 ----------------------------------------------------------------------  OBSTETRICS REPORT                        (  Signed Final 05/28/2022 10:40 pm) ---------------------------------------------------------------------- Patient Info  ID #:       [id number]                          D.O.B.:  May 12, 2001 (21 yrs)  Name:       Melissa FORGUES [facility]-                Visit Date: 05/28/2022 04:25 pm              [name] ---------------------------------------------------------------------- Performed By  Attending:        Sander Nephew      Secondary Phy.:    LISA Tsosie Billing-                    MD                                                              KIRBY CNM  Performed By:     Georgie Chard        Address:           [place], [place]  Referred By:      [facility]  MAU/Triage         Location[facility]          [facility]                                                              [place] ---------------------------------------------------------------------- Orders  #  Description                           Code        Ordered By  1  [place] MFM OB LIMITED                     [id number]    LISA LEFTWICH-  KIRBY ----------------------------------------------------------------------  #  Order #                     Accession #                Episode #  1  [id number]                   [id number]                 [id number] ---------------------------------------------------------------------- Indications  [redacted] weeks gestation of pregnancy                 Z3A.25  Oligohydramnios / Decreased amniotic fluid      O41.00X0  volume  Premature rupture of membranes - leaking        O42.90  fluid (+amnisure  09/27) ---------------------------------------------------------------------- Fetal Evaluation  Num Of Fetuses:          1  Fetal Heart Rate(bpm):   144  Cardiac Activity:        Observed  Presentation:            Cephalic  Placenta:                Anterior  P. Cord Insertion:       Not well visualized  Amniotic Fluid  AFI FV:      Oligohydramnios  AFI Sum(cm)     %Tile       Largest Pocket(cm)  3               < 3         1.6  RUQ(cm)       RLQ(cm)       LUQ(cm)        LLQ(cm)  0.6           0             0.8            1.6  Comment:    No subchorionic hemorrhage seen. ---------------------------------------------------------------------- Biometry  LV:        3.7  mm ---------------------------------------------------------------------- OB History  Gravidity:    4         Term:   2        Prem:   0        SAB:   1  TOP:          0       Ectopic:  0        Living: 2 ---------------------------------------------------------------------- Gestational Age  LMP:           25w 3d        Date:  12/01/21                  EDD:   09/07/22  Clinical EDD:   25w 1d                                        EDD:   09/09/22  Best:          25w 1d     Det. By:  Clinical EDD             EDD:   09/09/22 ---------------------------------------------------------------------- Anatomy  Cranium:               Appears normal  Stomach:                Appears normal, left                                                                        sided  Ventricles:            Appears normal         Kidneys:                Appear normal  Thoracic:              Appears normal         Bladder:                Appears normal  Diaphragm:             Appears normal ---------------------------------------------------------------------- Cervix Uterus Adnexa  Cervix  Length:            3.8  cm.  Normal appearance by transvaginal scan  Uterus  No abnormality visualized.  Right Ovary  Not visualized.  Left Ovary  Not visualized.  Cul De Sac  No free fluid seen.  Adnexa  No abnormality visualized. ---------------------------------------------------------------------- Impression  Limited exam with known oligohydramnios and vaginal  bleeding. and known PPROM  Good fetal movement and oligohydramnios again seen  No evidence of subchorionic hemorrhage, placental previa or  abruption. ---------------------------------------------------------------------- Recommendations  Clinical correlation recommended. ----------------------------------------------------------------------              Sander Nephew, MD Electronically Signed Final Report   05/28/2022 10:40 pm ----------------------------------------------------------------------  [place] MFM OB LIMITED  Result Date: 05/26/2022 ----------------------------------------------------------------------  OBSTETRICS REPORT                       (Signed Final 05/26/2022 03:21 pm) ---------------------------------------------------------------------- Patient Info  ID #:       [id number]                          D.O.B.:  2001-07-31 (21 yrs)  Name:       Melissa [name]  [facility]-                Visit Date: 05/26/2022 02:09 pm              [name] ---------------------------------------------------------------------- Performed By  Attending:        Johnell Comings MD         Ref. Address:     [address]  [address] [place]                                                             [place]  Performed By:     Stephenie Acres        [facility]                                  [facility]                                                             [place] [facility]                                                             [facility]  Referred By:      [facility] ---------------------------------------------------------------------- Orders  #  Description                           Code        Ordered By  1  [place] MFM OB LIMITED                     [phone]    YU FANG ----------------------------------------------------------------------  #  Order #                     Accession #                Episode #  1  [id number]                   [id number]                 [id number] ---------------------------------------------------------------------- Indications  Elevated MSAFP (MoM 4.1)                       O28.9  Oligohydraminios, second trimester,            O41.02X0  unspecified  Premature rupture of membranes - leaking       O42.90  fluid (+amnisure)  History of sickle cell trait (GC 05-12-21)      Z86.2  Rh negative state in antepartum  O36.0190  Previous cesarean delivery, antepartum         O34.219  Seizure disorder (Last one 2015-on meds)       O99.350 G40.909  Herpes simplex virus (HSV)                     O98.519 B00.9  Asthma                                         O99.89 j45.909  Short interval between pregancies, 2nd         O09.892   trimester  Obesity complicating pregnancy, second         O99.212  trimester  [redacted] weeks gestation of pregnancy                Z3A.24 ---------------------------------------------------------------------- Fetal Evaluation  Num Of Fetuses:         1  Fetal Heart Rate(bpm):  136  Cardiac Activity:       Observed  Presentation:           Cephalic  Placenta:               Anterior  P. Cord Insertion:      Previously Visualized  Amniotic Fluid  AFI FV:      Oligohydramnios  AFI Sum(cm)     %Tile       Largest Pocket(cm)  3.16            < 3         1.61  RUQ(cm)       RLQ(cm)  1.61          1.55  Comment:    Pocket 1.7 x 2.1 ---------------------------------------------------------------------- Biometry  LV:        4.3  mm ---------------------------------------------------------------------- OB History  Gravidity:    4         Term:   2        Prem:   0        SAB:   1  TOP:          0       Ectopic:  0        Living: 2 ---------------------------------------------------------------------- Gestational Age  LMP:           25w 1d        Date:  12/01/21                  EDD:   09/07/22  Clinical EDD:  24w 6d                                        EDD:   09/09/22  Best:          24w 6d     Det. By:  Clinical EDD             EDD:   09/09/22 ---------------------------------------------------------------------- Anatomy  Cranium:               Appears normal         LVOT:                   Previously seen  Cavum:  Previously seen        Aortic Arch:            Previously seen  Ventricles:            Previously seen        Ductal Arch:            Previously seen  Choroid Plexus:        Previously seen        Diaphragm:              Appears normal  Cerebellum:            Previously seen        Stomach:                Appears normal, left                                                                        sided  Posterior Fossa:       Previously seen        Abdomen:                Previously seen  Nuchal Fold:            Not applicable ([id number]    Abdominal Wall:         Previously seen                         wks GA)  Face:                  Orbits previously      Cord Vessels:           Previously seen                         seen  Lips:                  Previously seen        Kidneys:                Appear normal  Palate:                Not well visualized    Bladder:                Appears normal  Thoracic:              Appears normal         Spine:                  Limited views                                                                        previously seen  Heart:                 Not well visualized    Upper Extremities:  Not well visualized  RVOT:                  Previously seen        Lower Extremities:      Not well visualized  Other:  Gender not well visualized. Technically difficult due to fetal position          and low fluid. ---------------------------------------------------------------------- Comments  This patient was seen for an ultrasound exam due to  oligohydramnios possibly due to placental dysfunction with an  elevated MSAFP of 4.1 MoM.  She was discharged home  from the hospital earlier this week after she ruled out for  rupture of membranes.  Her AmniSure test, ferning, and  pooling tests were all negative.  The patient denies any problems since being discharged from  the hospital.  She reports feeling movements throughout the  day.  On today's exam, oligohydramnios continues to be noted.  The total AFI was 3.16 cm.  This is a slight improvement from her ultrasound earlier this  week.  Fetal movements were noted during today's ultrasound exam.  Doppler studies of the umbilical arteries performed today  continues to show normal forward flow.  There were no signs  of absent or reversed end-diastolic flow.  Due to oligohydramnios, we will continue to follow her with  weekly ultrasound exams for fetal assessment.  We will plan on starting weekly NSTs at 28 weeks.  Should oligohydramnios continue to be  noted, delivery should  be considered at around 34 weeks.  She will return in 1 week for another ultrasound. ----------------------------------------------------------------------                   Johnell Comings, MD Electronically Signed Final Report   05/26/2022 03:21 pm ----------------------------------------------------------------------    Medications:  Scheduled  famotidine  20 mg Oral Daily   influenza vac split quadrivalent PF  0.5 mL Intramuscular Tomorrow-1000   prenatal multivitamin  1 tablet Oral Q1200   sodium chloride flush  3 mL Intravenous Q12H   valACYclovir  500 mg Oral BID   I have reviewed the patient's current medications.  ASSESSMENT: [id number] [redacted]w[redacted]d Estimated Date of Delivery: 09/09/22  Patient Active Problem List   Diagnosis Date Noted   Vaginal bleeding during pregnancy 05/28/2022   Gonorrhea affecting pregnancy in second trimester 05/18/2022   ?fetal pericardial effusion 05/18/2022   [redacted] weeks gestation of pregnancy    Preterm premature rupture of membranes (PPROM) with unknown onset of labor 05/17/2022   Oligohydramnios antepartum 05/11/2022   Abnormal antenatal AFP screen 04/12/2022   History of cesarean section 04/07/2022   History of herpes genitalis 04/07/2022   Supervision of high risk pregnancy, antepartum 02/08/2022   Trichomonal vaginitis during pregnancy in second trimester 02/01/2022   History of postpartum depression 11/16/2021   Migraine with aura and without status migrainosus, not intractable 08/12/2019   Seizures ([place])    Sickle cell trait ([place]) 02/13/2019   Rh negative state in antepartum period 02/05/2019   Short interval between pregnancies affecting pregnancy in second trimester, antepartum 02/05/2019   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  It is unclear if pt is truly ruptured or if amnisure positive due to bleeding.  If bleeding resolves, may consider repeat amnisure.    Janyth Pupa, DO Attending [place], [facility] for [employer], [place]

## 2022-05-29 NOTE — Progress Notes (Signed)
   05/29/22 0206  Pain Assessment  Pain Assessment 0-10  Pain Score 5  Pain Location Abdomen  Pain Orientation Mid;Upper  Pain Descriptors / Indicators Cramping  Pain Frequency Constant  Pain Onset On-going  Pain Intervention(s) Medication (See eMAR)   Few minutes about patient c/o of abdominal pain, she also requested for cracker and peanut peanut, ate it without any issues

## 2022-05-29 NOTE — Progress Notes (Signed)
Pt sad and teary. Concerned about staying here due to childcare. States her mother is taking care of her kids and her mother is sick and needs to have surgery. Emotional support provided. Will put in social work consult.

## 2022-05-29 NOTE — Progress Notes (Signed)
CSW met with Patient at her bedside in room 110. When CSW arrived, MOB was resting in bed watching TV. CSW explained CSW's role and invited patient to ask questions. CSW made patient aware of consult for concerns for childcare. Patient communicated that her children are safe with patient's mother and her 5 younger siblings.  Patient shared, "I just feel bad because I am the one that usually takes care of my kids although my mom and siblings help." CSW validated and normalized patient's thoughts and feelings and reviewed natural inventions to help increase patient's mood; patient was receptive. CSW offered to meet with patient again towards that latter part of the week and patient was receptive.   Laurey Arrow, MSW, LCSW Clinical Social Work [phone]

## 2022-05-30 DIAGNOSIS — Z3A25 25 weeks gestation of pregnancy: Secondary | ICD-10-CM

## 2022-05-30 DIAGNOSIS — O26892 Other specified pregnancy related conditions, second trimester: Secondary | ICD-10-CM

## 2022-05-30 DIAGNOSIS — O4692 Antepartum hemorrhage, unspecified, second trimester: Secondary | ICD-10-CM

## 2022-05-30 MED ORDER — MENTHOL 3 MG MT LOZG
2.0000 | LOZENGE | OROMUCOSAL | Status: DC | PRN
  Administered 2022-05-30 – 2022-06-03 (×2): 6 mg via ORAL
  Filled 2022-05-30 (×2): qty 18

## 2022-05-30 NOTE — Plan of Care (Signed)
  Problem: Clinical Measurements: Goal: Ability to maintain clinical measurements within normal limits will improve Outcome: Progressing Goal: Will remain free from infection Outcome: Progressing Goal: Diagnostic test results will improve Outcome: Progressing   Problem: Coping: Goal: Level of anxiety will decrease Outcome: Progressing   

## 2022-05-30 NOTE — Progress Notes (Signed)
Patient ID: Melissa [name], female   DOB: 12/05/2000, 21 y.o.   MRN: [id number] [place]) NOTE  Melissa [name] is a 21 y.o. [id number] at [redacted]w[redacted]d by best clinical estimate who is admitted for PROM.   Fetal presentation is cephalic. Length of Stay:  2  Days  ASSESSMENT: Principal Problem:   Vaginal bleeding during pregnancy   PLAN: Latency Abx S/p BMZ Monitor bleeding Delivery with s/sx's of Triple I or unstable maternal or fetal status  Subjective: Feels well. Bleeding is stable. No more pain. Improved with home Vistaril. Patient reports the fetal movement as active. Patient reports uterine contraction  activity as none. Patient reports  vaginal bleeding as less flow than a normal period. Patient describes fluid per vagina as Other bloody.  Vitals:  Blood pressure 109/68, pulse 82, temperature 97.6 F (36.4 C), temperature source Oral, resp. rate 18, height 5\' 6"  (1.676 m), weight 106.6 kg, last menstrual period 12/03/2021, SpO2 100 %, not currently breastfeeding. Physical Examination:  General appearance - alert, well appearing, and in no distress Chest - normal effort Abdomen - gravid, non-tender Fundal Height:  size equals dates Extremities: Homans sign is negative, no sign of DVT  Membranes:ruptured, bloody fluid  Fetal Monitoring:  Baseline: 135 bpm, Variability: Good {> 6 bpm), Accelerations: Non-reactive but appropriate for gestational age, and Decelerations: Absent Toco: quiet  Labs:  No results found for this or any previous visit (from the past 24 hour(s)).  Imaging Studies:       Medications:  Scheduled  famotidine  20 mg Oral Daily   LORazepam  1 mg Intravenous Once   prenatal multivitamin  1 tablet Oral Q1200   sodium chloride flush  3 mL Intravenous Q12H   terbutaline  0.25 mg Subcutaneous Once   valACYclovir  500 mg Oral BID   I have reviewed the patient's current medications.   Donnamae Jude,  MD 05/30/2022,7:35 AM

## 2022-05-31 NOTE — Progress Notes (Signed)
Patient ID: [name], female   DOB: 2000/11/12, 21 y.o.   MRN: [id number] FACULTY PRACTICE ANTEPARTUM(COMPREHENSIVE) NOTE  Melissa [name] is a 21 y.o. F7P1025 with Estimated Date of Delivery: 09/09/22   By  early ultrasound [redacted]w[redacted]d  who is admitted for PROM, bleeding.    Fetal presentation is cephalic. Length of Stay:  3  Days  Date of admission:05/28/2022  Subjective: No complaints Some occasional bleeding, light Patient reports the fetal movement as active. Patient reports uterine contraction  activity as irregualr. Patient reports  vaginal bleeding as spotting. Patient describes fluid per vagina as None.  Vitals:  Blood pressure (!) 98/57, pulse 95, temperature 98 F (36.7 C), temperature source Oral, resp. rate 18, height 5\' 6"  (1.676 m), weight 106.6 kg, last menstrual period 12/03/2021, SpO2 98 %, not currently breastfeeding. Vitals:   05/31/22 0700 05/31/22 0809 05/31/22 1222 05/31/22 1525  BP: (!) 102/49 (!) 88/49 116/74 (!) 98/57  Pulse: 83 82 89 95  Resp: 20  18 18   Temp: 98.7 F (37.1 C)  98.1 F (36.7 C) 98 F (36.7 C)  TempSrc: Oral  Oral Oral  SpO2: 98%     Weight:      Height:       Physical Examination:  General appearance - alert, well appearing, and in no distress Abdomen - soft, nontender, nondistended, no masses or organomegaly Fundal Height:  size equals dates Pelvic Exam:  examination not indicated Cervical Exam: Not evaluated. a. Extremities: extremities normal, atraumatic, no cyanosis or edema with DTRs 2+ bilaterally Membranes:ruptured, clear fluid  Fetal Monitoring:  Baseline: 145 bpm, Variability: Good {> 6 bpm), Accelerations: Reactive, and Decelerations: Absent   reactive  Labs:  No results found for this or any previous visit (from the past 24 hour(s)).  Imaging Studies:    07/31/22 MFM OB Limited  Result Date: 05/28/2022 ----------------------------------------------------------------------  OBSTETRICS REPORT                         (Signed Final 05/28/2022 10:40 pm) ---------------------------------------------------------------------- Patient Info  ID #:       07/28/2022                          D.O.B.:  01-Jul-2001 (21 y.o.)  Name:       Melissa [name] ---------------------------------------------------------------------- Performed By  Attending:        [facility]      Secondary Phy.:    LISA 07/28/2022-                    MD                                                              KIRBY CNM  Performed By:     Lin Landsman        Address:           [address]  [place]                                                              [place], [place]                                                              [address]  Referred By:      [facility] MAU/Triage         [facility] ---------------------------------------------------------------------- Orders  #  Description                           Code        Ordered By  1  [place] MFM OB LIMITED                     76815.01    LISA LEFTWICH-                                                       KIRBY ----------------------------------------------------------------------  #  Order #                     Accession #                Episode #  1  [id number]                   [id number]                 [id number] ---------------------------------------------------------------------- Indications  [redacted] weeks gestation of pregnancy                 Z3A.25  Oligohydramnios / Decreased amniotic fluid      O41.00X0  volume  Premature rupture of membranes - leaking        O42.90  fluid (+amnisure  09/27) ---------------------------------------------------------------------- Fetal Evaluation  Num Of Fetuses:          1  Fetal Heart Rate(bpm):   144  Cardiac Activity:        Observed  Presentation:            Cephalic  Placenta:                 Anterior  P. Cord Insertion:       Not well visualized  Amniotic Fluid  AFI FV:      Oligohydramnios  AFI Sum(cm)     %Tile  Largest Pocket(cm)  3               < 3         1.6  RUQ(cm)       RLQ(cm)       LUQ(cm)        LLQ(cm)  0.6           0             0.8            1.6  Comment:    No subchorionic hemorrhage seen. ---------------------------------------------------------------------- Biometry  LV:        3.7  mm ---------------------------------------------------------------------- OB History  Gravidity:    4         Term:   2        Prem:   0        SAB:   1  TOP:          0       Ectopic:  0        Living: 2 ---------------------------------------------------------------------- Gestational Age  LMP:           25w 3d        Date:  12/01/21                  EDD:   09/07/22  Clinical EDD:  25w 1d                                        EDD:   09/09/22  Best:          25w 1d     Det. By:  Clinical EDD             EDD:   09/09/22 ---------------------------------------------------------------------- Anatomy  Cranium:               Appears normal         Stomach:                Appears normal, left                                                                        sided  Ventricles:            Appears normal         Kidneys:                Appear normal  Thoracic:              Appears normal         Bladder:                Appears normal  Diaphragm:             Appears normal ---------------------------------------------------------------------- Cervix Uterus Adnexa  Cervix  Length:            3.8  cm.  Normal appearance by transvaginal scan  Uterus  No abnormality visualized.  Right Ovary  Not visualized.  Left Ovary  Not visualized.  Cul De Sac  No free fluid seen.  Adnexa  No  abnormality visualized. ---------------------------------------------------------------------- Impression  Limited exam with known oligohydramnios and vaginal  bleeding. and known PPROM  Good fetal movement and oligohydramnios  again seen  No evidence of subchorionic hemorrhage, placental previa or  abruption. ---------------------------------------------------------------------- Recommendations  Clinical correlation recommended. ----------------------------------------------------------------------              Lin Landsman, MD Electronically Signed Final Report   05/28/2022 10:40 pm ----------------------------------------------------------------------    Medications:  Scheduled  famotidine  20 mg Oral Daily   LORazepam  1 mg Intravenous Once   prenatal multivitamin  1 tablet Oral Q1200   sodium chloride flush  3 mL Intravenous Q12H   terbutaline  0.25 mg Subcutaneous Once   valACYclovir  500 mg Oral BID   I have reviewed the patient's current medications.  ASSESSMENT: G4P2012 [redacted]w[redacted]d Estimated Date of Delivery: 09/09/22  Vaginal bleeding, presumed marginal sinus abruption PPROM  PLAN: S/P BMZ, latency antibiotics and magnesium NICU consult In house observation til delivery cephalic  Melissa [name] 05/31/2022,4:48 PM

## 2022-05-31 NOTE — Progress Notes (Signed)
RN entered patient room for rounds, patient asked to be put on FHR monitor because she was complaining of intense cramping. RN put patient on monitor and assessed pain. Patient rated pain a 7/10 in her upper abdomen. RN saw UI on FHR strip and gave patient a 500 cc bolus. RN also administered 650 mg of acetaminophen to patient. After bolus RN reassessed pain and pain was tolerable for patient and no more UI on the monitor.

## 2022-05-31 NOTE — Progress Notes (Signed)
Patient called out and said contractions were picking back up and request to put the [place] on. Patient request tylenol and something to help her relax.

## 2022-06-01 ENCOUNTER — Ambulatory Visit: Payer: Medicaid Other

## 2022-06-01 ENCOUNTER — Encounter: Payer: Medicaid Other | Admitting: Obstetrics and Gynecology

## 2022-06-01 DIAGNOSIS — O99892 Other specified diseases and conditions complicating childbirth: Secondary | ICD-10-CM

## 2022-06-01 NOTE — Progress Notes (Signed)
[place] ANTEPARTUM PROGRESS NOTE  Melissa [name] is a 21 y.o. O2H4765 at [redacted]w[redacted]d who is admitted for PROM and bleeding.  Estimated Date of Delivery: 09/09/22 Fetal presentation is cephalic.  Length of Stay:  4 Days. Admitted 05/28/2022  Subjective: Pt seen.  She is comfortable.  Pt noted continued slow vaginal bleeding.  She notes she filled a pad over night.  She notes her abdomen is nontender.  She denies fever/ chills. Patient reports normal fetal movement.  She denies uterine contractions, and noted continued bleeding and leaking of fluid per vagina.  Vitals:  Blood pressure 109/62, pulse 86, temperature 98.1 F (36.7 C), temperature source Oral, resp. rate 18, height 5\' 6"  (1.676 m), weight 106.6 kg, last menstrual period 12/03/2021, SpO2 98 %, not currently breastfeeding. Physical Examination: CONSTITUTIONAL: Well-developed, well-nourished female in no acute distress.  HENT:  Normocephalic, atraumatic, External right and left ear normal. Oropharynx is clear and moist EYES: Conjunctivae and EOM are normal.  NECK: Normal range of motion, supple, no masses. SKIN: Skin is warm and dry. No rash noted. Not diaphoretic. No erythema. No pallor. [place]: Alert and oriented to person, place, and time. Normal reflexes, muscle tone coordination. No cranial nerve deficit noted. PSYCHIATRIC: Normal mood and affect. Normal behavior. Normal judgment and thought content. CARDIOVASCULAR: Normal heart rate noted, regular rhythm RESPIRATORY: Effort and breath sounds normal, no problems with respiration noted MUSCULOSKELETAL: Normal range of motion. No edema and no tenderness. ABDOMEN: Soft, nontender, nondistended, gravid. CERVIX: deferred  Fetal monitoring: FHR: 140 bpm, Variability: moderate, Accelerations: Present, Decelerations: occasional small variable  Uterine activity: none  No results found for this or any previous visit (from the past 48 hour(s)).  I have reviewed the  patient's current medications.  ASSESSMENT: Y6T0354 25.5 weeks Principal Problem:   Vaginal bleeding during pregnancy PPROM  PLAN: Pt s/p BMZ, continue latency abx. Remain in house until 34 weeks with IOL or if maternal/ fetal issues dictate earlier delivery   Continue routine antenatal care.   Lynnda Shields, MD [place] Attending, Center for [facility] 06/01/2022 8:33 AM

## 2022-06-02 DIAGNOSIS — Z3A25 25 weeks gestation of pregnancy: Secondary | ICD-10-CM | POA: Diagnosis not present

## 2022-06-02 DIAGNOSIS — O4690 Antepartum hemorrhage, unspecified, unspecified trimester: Secondary | ICD-10-CM | POA: Diagnosis not present

## 2022-06-02 MED ORDER — FERROUS SULFATE 325 (65 FE) MG PO TABS
325.0000 mg | ORAL_TABLET | Freq: Every day | ORAL | Status: DC
  Administered 2022-06-02 – 2022-06-07 (×6): 325 mg via ORAL
  Filled 2022-06-02 (×6): qty 1

## 2022-06-02 NOTE — Progress Notes (Signed)
FACULTY PRACTICE ANTEPARTUM(COMPREHENSIVE) NOTE  Melissa [name] is a 21 y.o. [id number] with Estimated Date of Delivery: 09/09/22   By  LMP [redacted]w[redacted]d  who is admitted for PPROM with vaginal bleeding.    Fetal presentation is cephalic. Length of Stay:  5  Days  Date of admission:05/28/2022  Subjective: No acute complaints overnight.  No fever/chills.   Patient reports the fetal movement as active. Patient reports uterine contraction  activity as none. Patient reports  vaginal bleeding as spotting. Patient describes fluid per vagina as None.  Vitals:  Blood pressure (!) 109/59, pulse 91, temperature 98 F (36.7 C), temperature source Oral, resp. rate 18, height 5\' 6"  (1.676 m), weight 106.6 kg, last menstrual period 12/03/2021, SpO2 100 %, not currently breastfeeding. Vitals:   06/01/22 0723 06/01/22 1119 06/01/22 1708 06/01/22 2336  BP: 109/62 (!) 99/54 (!) 107/56 (!) 109/59  Pulse: 86 96 93 91  Resp: 18 18  18   Temp: 98.1 F (36.7 C) (!) 97.2 F (36.2 C) 98 F (36.7 C) 98 F (36.7 C)  TempSrc: Oral  Oral Oral  SpO2:  100%  100%  Weight:      Height:       Physical Examination:  General appearance - alert, well appearing, and in no distress Mental status - normal mood, behavior, speech, dress, motor activity, and thought processes Chest - CTAB Heart - normal rate and regular rhythm Abdomen - gravid, soft and non-tender, no rebound, no guarding Extremities - no edema, no calf tenderness bilaterally Skin - warm and dry   Fetal Monitoring:  Baseline: 140 bpm, Variability: moderate, Accelerations: +15x15 accels, and Decelerations: Absent    reactive  Medications:  Scheduled  famotidine  20 mg Oral Daily   LORazepam  1 mg Intravenous Once   prenatal multivitamin  1 tablet Oral Q1200   sodium chloride flush  3 mL Intravenous Q12H   terbutaline  0.25 mg Subcutaneous Once   valACYclovir  500 mg Oral BID   I have reviewed the patient's current  medications.  ASSESSMENT: O3F2902 [redacted]w[redacted]d Estimated Date of Delivery: 09/09/22  05/28/2022   Gonorrhea affecting pregnancy in second trimester 05/18/2022  [redacted] weeks gestation of pregnancy    Preterm premature rupture of membranes (PPROM) with unknown onset of labor 05/17/2022  Oligohydramnios antepartum 05/11/2022  Abnormal antenatal AFP screen 04/12/2022  History of cesarean section 04/07/2022  History of herpes genitalis 04/07/2022  Supervision of high risk pregnancy, antepartum 02/08/2022  Trichomonal vaginitis during pregnancy in second trimester 02/01/2022  History of postpartum depression 11/16/2021  Migraine with aura and without status migrainosus, not intractable 08/12/2019  Seizures ([place])    Sickle cell trait ([place]) 02/13/2019  Rh negative state in antepartum period 02/05/2019  Short interval between pregnancies affecting pregnancy in second trimester, antepartum 02/05/2019  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  Ideally plan for delivery at 34 weeks unless otherwise indicated due to maternal or fetal status.  Janyth Pupa, DO Attending [place], [facility] for [employer], [place]

## 2022-06-02 NOTE — Progress Notes (Signed)
CSW met with patient at her bedside in room 110. When CSW arrived, CSW was resting in bed watching TV.  CSW assessed for psychosocial stressors. MOB denied all stressors and openly shared that her children has been visiting her while she has remained inpatient. MOB denied having any questions, concerns, or needs at this time.  CSW will follow-up with MOB next to re-assess.   [place], MSW, LCSW Clinical Social Work [phone]  

## 2022-06-03 DIAGNOSIS — O42012 Preterm premature rupture of membranes, onset of labor within 24 hours of rupture, second trimester: Secondary | ICD-10-CM

## 2022-06-03 LAB — RESP PANEL BY RT-PCR (FLU A&B, COVID) ARPGX2
Influenza A by PCR: NEGATIVE
Influenza B by PCR: NEGATIVE
SARS Coronavirus 2 by RT PCR: NEGATIVE

## 2022-06-03 MED ORDER — PHENOL 1.4 % MT LIQD
1.0000 | OROMUCOSAL | Status: DC | PRN
  Administered 2022-06-03: 1 via OROMUCOSAL
  Filled 2022-06-03: qty 177

## 2022-06-03 MED ORDER — OXYMETAZOLINE HCL 0.05 % NA SOLN
1.0000 | Freq: Two times a day (BID) | NASAL | Status: AC
  Administered 2022-06-03 – 2022-06-04 (×4): 1 via NASAL
  Filled 2022-06-03: qty 30

## 2022-06-03 NOTE — Progress Notes (Signed)
Patient ID: Melissa [name], female   DOB: Apr 17, 2001, 21 y.o.   MRN: [id number] [place]) NOTE  Melissa [name] is a 21 y.o. K1S0109 with Estimated Date of Delivery: 09/09/22   By  early ultrasound [redacted]w[redacted]d  who is admitted for PROM, 2nd trimester bleeding.    Fetal presentation is cephalic. Length of Stay:  6  Days  Date of admission:05/28/2022  Subjective: Minimal bleeding, no contractions, cold symptom complaints Patient reports the fetal movement as active. Patient reports uterine contraction  activity as none. Patient reports  vaginal bleeding as scant staining. Patient describes fluid per vagina as Clear.  Vitals:  Blood pressure 103/64, pulse 85, temperature 98.4 F (36.9 C), temperature source Oral, resp. rate 16, height 5\' 6"  (1.676 m), weight 106.6 kg, last menstrual period 12/03/2021, SpO2 100 %, not currently breastfeeding. Vitals:   06/02/22 0835 06/02/22 1200 06/02/22 2013 06/03/22 0820  BP:  (!) 103/58 115/66 103/64  Pulse:  94 92 85  Resp:  17 18 16   Temp:  97.9 F (36.6 C) 98.2 F (36.8 C) 98.4 F (36.9 C)  TempSrc:  Oral Oral Oral  SpO2: 100% 100% 100% 100%  Weight:      Height:       Physical Examination:  General appearance - alert, well appearing, and in no distress Abdomen - soft, nontender, nondistended, no masses or organomegaly Fundal Height:  size equals dates Pelvic Exam:  examination not indicated Cervical Exam: Not evaluated.. Extremities: extremities normal, atraumatic, no cyanosis or edema with DTRs 2+ bilaterally Membranes:ruptured, clear fluid  Fetal Monitoring:  Baseline: 150 bpm, Variability: Good {> 6 bpm), Accelerations: Reactive, and Decelerations: Absent   reactive  Labs:  No results found for this or any previous visit (from the past 24 hour(s)).  Imaging Studies:    No results found.   Medications:  Scheduled  famotidine  20 mg Oral Daily   ferrous sulfate  325 mg Oral QHS   LORazepam   1 mg Intravenous Once   oxymetazoline  1 spray Each Nare BID   prenatal multivitamin  1 tablet Oral Q1200   sodium chloride flush  3 mL Intravenous Q12H   terbutaline  0.25 mg Subcutaneous Once   valACYclovir  500 mg Oral BID   I have reviewed the patient's current medications.  ASSESSMENT: N2T5573 [redacted]w[redacted]d Estimated Date of Delivery: 09/09/22  PPROM with oligohydramnios Vaginal bleeding in the second trimester, presumed marginal sinus abruption Elevated MSAFP with normal sonogram +GC-->treated   PLAN: >continue in house observation with twice daily monitoring >S/P BMZ series >S/P neuroprotective magnesium >S/P latency antibiotics >Neonatology consult 05/18/22  CBC in am Sonogram ordered for 06/05/22(weekly)  Cold symptoms this am--> chloraseptic spray + afrin nasal spray  Melissa [name] 06/03/2022,10:03 AM

## 2022-06-04 ENCOUNTER — Encounter (HOSPITAL_COMMUNITY): Payer: Self-pay | Admitting: Obstetrics and Gynecology

## 2022-06-04 LAB — CBC
HCT: 29 % — ABNORMAL LOW (ref 36.0–46.0)
Hemoglobin: 9.7 g/dL — ABNORMAL LOW (ref 12.0–15.0)
MCH: 25.7 pg — ABNORMAL LOW (ref 26.0–34.0)
MCHC: 33.4 g/dL (ref 30.0–36.0)
MCV: 76.7 fL — ABNORMAL LOW (ref 80.0–100.0)
Platelets: 169 10*3/uL (ref 150–400)
RBC: 3.78 MIL/uL — ABNORMAL LOW (ref 3.87–5.11)
RDW: 15.7 % — ABNORMAL HIGH (ref 11.5–15.5)
WBC: 9.3 10*3/uL (ref 4.0–10.5)
nRBC: 0.3 % — ABNORMAL HIGH (ref 0.0–0.2)

## 2022-06-04 MED ORDER — GENTAMICIN SULFATE 40 MG/ML IJ SOLN
5.0000 mg/kg | Freq: Once | INTRAVENOUS | Status: AC
  Administered 2022-06-05: 390 mg via INTRAVENOUS
  Filled 2022-06-04 (×2): qty 9.75

## 2022-06-04 MED ORDER — SODIUM CHLORIDE 0.9 % IV BOLUS
500.0000 mL | Freq: Once | INTRAVENOUS | Status: AC
  Administered 2022-06-04: 500 mL via INTRAVENOUS

## 2022-06-04 MED ORDER — POLYETHYLENE GLYCOL 3350 17 G PO PACK
17.0000 g | PACK | Freq: Every day | ORAL | Status: DC
  Administered 2022-06-04 – 2022-06-08 (×5): 17 g via ORAL
  Filled 2022-06-04 (×6): qty 1

## 2022-06-04 MED ORDER — KETOROLAC TROMETHAMINE 30 MG/ML IJ SOLN
30.0000 mg | Freq: Once | INTRAMUSCULAR | Status: AC
  Administered 2022-06-04: 30 mg via INTRAVENOUS
  Filled 2022-06-04: qty 1

## 2022-06-04 MED ORDER — SODIUM CHLORIDE 0.9 % IV SOLN
2.0000 g | Freq: Four times a day (QID) | INTRAVENOUS | Status: DC
  Administered 2022-06-04 – 2022-06-05 (×2): 2 g via INTRAVENOUS
  Filled 2022-06-04 (×2): qty 2000

## 2022-06-04 MED ORDER — BISACODYL 10 MG RE SUPP
10.0000 mg | Freq: Every day | RECTAL | Status: DC | PRN
  Filled 2022-06-04: qty 1

## 2022-06-04 NOTE — Progress Notes (Signed)
Patient ID: XOE HOE, female   DOB: 2000-10-01, 21 y.o.   MRN: [id number] Back to check on pt. Pt sitting up in bed talking on cell phone. Feels better, pain has resolved Continue with current management

## 2022-06-04 NOTE — Progress Notes (Signed)
Patient ID: DEJANEE THIBEAUX, female   DOB: 31-Jul-2001, 21 y.o.   MRN: [id number] Patient ID: BELLAMIE TURNEY, female   DOB: August 10, 2001, 21 y.o.   MRN: [id number] [place]) NOTE  Jenina Moening Kellam-Wallace is a 21 y.o. [id number] with Estimated Date of Delivery: 09/09/22   By  early ultrasound [redacted]w[redacted]d   who is admitted for PROM, 2nd trimester bleeding.    Fetal presentation is cephalic. Length of Stay:  7  Days  Date of admission:05/28/2022  Subjective: Minimal bleeding, no contractions, cold symptom complaints Some gas pain, feels constipated she says Was on monitor no contractions noted Patient reports the fetal movement as active. Patient reports uterine contraction  activity as none. Patient reports  vaginal bleeding as scant staining. Patient describes fluid per vagina as Clear.  Vitals:  Blood pressure 104/60, pulse 92, temperature 97.7 F (36.5 C), temperature source Oral, resp. rate 18, height 5\' 6"  (1.676 m), weight 106.6 kg, last menstrual period 12/03/2021, SpO2 99 %, not currently breastfeeding. Vitals:   06/03/22 1721 06/03/22 2030 06/03/22 2345 06/04/22 0800  BP: 108/63 118/71 (!) 100/58 104/60  Pulse: 98 94 88 92  Resp: 17 17 17 18   Temp: 97.9 F (36.6 C) 98.5 F (36.9 C) 97.7 F (36.5 C) 97.7 F (36.5 C)  TempSrc: Oral Oral Oral Oral  SpO2: 100% 100% 99% 99%  Weight:      Height:       Physical Examination:  General appearance - alert, well appearing, and in no distress Abdomen - soft, nontender, nondistended, no masses or organomegaly Fundal Height:  size equals dates Pelvic Exam:  examination not indicated Cervical Exam: Not evaluated.. Extremities: extremities normal, atraumatic, no cyanosis or edema with DTRs 2+ bilaterally Membranes:ruptured, clear fluid   Fetal Monitoring:  Baseline: 150s bpm, Variability: Fair (1-6 bpm), Accelerations: Non-reactive but appropriate for gestational age, and Decelerations: Variable: mild    equivocal   Florian Buff, MD    Labs:  Results for orders placed or performed during the hospital encounter of 05/28/22 (from the past 24 hour(s))  Resp Panel by RT-PCR (Flu A&B, Covid) Anterior Nasal Swab   Collection Time: 06/03/22  5:30 PM   Specimen: Anterior Nasal Swab  Result Value Ref Range   SARS Coronavirus 2 by RT PCR NEGATIVE NEGATIVE   Influenza A by PCR NEGATIVE NEGATIVE   Influenza B by PCR NEGATIVE NEGATIVE  CBC   Collection Time: 06/04/22  5:11 AM  Result Value Ref Range   WBC 9.3 4.0 - 10.5 K/uL   RBC 3.78 (L) 3.87 - 5.11 MIL/uL   Hemoglobin 9.7 (L) 12.0 - 15.0 g/dL   HCT 29.0 (L) 36.0 - 46.0 %   MCV 76.7 (L) 80.0 - 100.0 fL   MCH 25.7 (L) 26.0 - 34.0 pg   MCHC 33.4 30.0 - 36.0 g/dL   RDW 15.7 (H) 11.5 - 15.5 %   Platelets 169 150 - 400 K/uL   nRBC 0.3 (H) 0.0 - 0.2 %    Imaging Studies:    No results found.   Medications:  Scheduled  famotidine  20 mg Oral Daily   ferrous sulfate  325 mg Oral QHS   LORazepam  1 mg Intravenous Once   oxymetazoline  1 spray Each Nare BID   prenatal multivitamin  1 tablet Oral Q1200   sodium chloride flush  3 mL Intravenous Q12H   terbutaline  0.25 mg Subcutaneous Once   valACYclovir  500 mg Oral BID  I have reviewed the patient's current medications.  ASSESSMENT: [id number] [redacted]w[redacted]d  Estimated Date of Delivery: 09/09/22  PPROM with oligohydramnios Vaginal bleeding in the second trimester, presumed marginal sinus abruption Elevated MSAFP with normal sonogram +GC-->treated   PLAN: >continue in house observation with twice daily monitoring >S/P BMZ series >S/P neuroprotective magnesium >S/P latency antibiotics >Neonatology consult 05/18/22   Sonogram ordered for 06/05/22(weekly)    Amaryllis Dyke Suzanne Kho 06/04/2022,10:45 AM

## 2022-06-04 NOTE — Progress Notes (Signed)
Patient ID: Melissa [name], female   DOB: 2000/09/22, 21 y.o.   MRN: [id number] Pt having pain again. More constant  now Temp 100.8 FHT's 160-170' s Uterus gravid, tender SVE deferred  A/P Chorioamnionitis  Will transfer to L & D for delivery. Nursing and NICU aware.

## 2022-06-04 NOTE — Progress Notes (Signed)
Patient ID: Melissa [name], female   DOB: 10-27-2000, 21 y.o.   MRN: [id number] CTSP by nursing secondary to pain.  Pt reports constant pain on her right side. No vaginal bleeding or change in her LOF   FHT's 160's   Abd soft, + BS, gravid, ? Tenderness SVE no change  A/P Will give IV fluid bolus. IV Toradol x 1 and continue to monitor.        Low suspension for infection or PTL at this time

## 2022-06-05 ENCOUNTER — Encounter (HOSPITAL_COMMUNITY)
Admission: AD | Disposition: A | Discharge status: Home / Self Care | Payer: Self-pay | Source: Home / Self Care | Attending: Obstetrics and Gynecology

## 2022-06-05 ENCOUNTER — Inpatient Hospital Stay (HOSPITAL_COMMUNITY): Payer: Medicaid Other | Admitting: Anesthesiology

## 2022-06-05 ENCOUNTER — Encounter (HOSPITAL_COMMUNITY): Payer: Self-pay | Admitting: Obstetrics and Gynecology

## 2022-06-05 DIAGNOSIS — O411230 Chorioamnionitis, third trimester, not applicable or unspecified: Secondary | ICD-10-CM

## 2022-06-05 DIAGNOSIS — O99892 Other specified diseases and conditions complicating childbirth: Secondary | ICD-10-CM

## 2022-06-05 DIAGNOSIS — Z3A26 26 weeks gestation of pregnancy: Secondary | ICD-10-CM

## 2022-06-05 DIAGNOSIS — O42013 Preterm premature rupture of membranes, onset of labor within 24 hours of rupture, third trimester: Secondary | ICD-10-CM

## 2022-06-05 DIAGNOSIS — O34211 Maternal care for low transverse scar from previous cesarean delivery: Secondary | ICD-10-CM

## 2022-06-05 LAB — TYPE AND SCREEN
ABO/RH(D): O NEG
Antibody Screen: POSITIVE

## 2022-06-05 SURGERY — Surgical Case
Anesthesia: General

## 2022-06-05 MED ORDER — KETOROLAC TROMETHAMINE 30 MG/ML IJ SOLN
INTRAMUSCULAR | Status: AC
  Filled 2022-06-05: qty 1

## 2022-06-05 MED ORDER — OXYTOCIN-SODIUM CHLORIDE 30-0.9 UT/500ML-% IV SOLN: 2.5000 [IU]/h | INTRAVENOUS | Status: DC

## 2022-06-05 MED ORDER — FENTANYL CITRATE (PF) 100 MCG/2ML IJ SOLN
INTRAMUSCULAR | Status: DC | PRN
  Administered 2022-06-05: 100 ug via INTRAVENOUS

## 2022-06-05 MED ORDER — GABAPENTIN 100 MG PO CAPS
200.0000 mg | ORAL_CAPSULE | Freq: Every day | ORAL | Status: DC
  Administered 2022-06-05 – 2022-06-07 (×3): 200 mg via ORAL
  Filled 2022-06-05 (×3): qty 2

## 2022-06-05 MED ORDER — COCONUT OIL OIL: 1.0000 | TOPICAL_OIL | Status: DC | PRN

## 2022-06-05 MED ORDER — OXYTOCIN-SODIUM CHLORIDE 30-0.9 UT/500ML-% IV SOLN
INTRAVENOUS | Status: DC | PRN
  Administered 2022-06-05: 200 mL via INTRAVENOUS
  Administered 2022-06-05: 500 mL via INTRAVENOUS

## 2022-06-05 MED ORDER — FENTANYL CITRATE (PF) 100 MCG/2ML IJ SOLN
50.0000 ug | INTRAMUSCULAR | Status: DC | PRN
  Administered 2022-06-05: 50 ug via INTRAVENOUS
  Filled 2022-06-05: qty 2

## 2022-06-05 MED ORDER — SCOPOLAMINE 1 MG/3DAYS TD PT72
1.0000 | MEDICATED_PATCH | Freq: Once | TRANSDERMAL | Status: AC
  Administered 2022-06-05: 1.5 mg via TRANSDERMAL
  Filled 2022-06-05: qty 1

## 2022-06-05 MED ORDER — SODIUM CHLORIDE 0.9 % IV SOLN
2.0000 g | Freq: Four times a day (QID) | INTRAVENOUS | Status: AC
  Administered 2022-06-05 – 2022-06-06 (×4): 2 g via INTRAVENOUS
  Filled 2022-06-05 (×4): qty 2000

## 2022-06-05 MED ORDER — CEFAZOLIN SODIUM-DEXTROSE 2-3 GM-%(50ML) IV SOLR
INTRAVENOUS | Status: DC | PRN
  Administered 2022-06-05: 2 g via INTRAVENOUS

## 2022-06-05 MED ORDER — ACETAMINOPHEN 500 MG PO TABS: 1000.0000 mg | ORAL_TABLET | Freq: Four times a day (QID) | ORAL | Status: DC

## 2022-06-05 MED ORDER — OXYCODONE HCL 5 MG PO TABS
5.0000 mg | ORAL_TABLET | Freq: Four times a day (QID) | ORAL | Status: DC | PRN
  Administered 2022-06-05 – 2022-06-08 (×4): 5 mg via ORAL
  Filled 2022-06-05 (×6): qty 1

## 2022-06-05 MED ORDER — OXYCODONE HCL 5 MG PO TABS: 5.0000 mg | ORAL_TABLET | Freq: Once | ORAL | Status: DC | PRN

## 2022-06-05 MED ORDER — PHENYLEPHRINE HCL (PRESSORS) 10 MG/ML IV SOLN
INTRAVENOUS | Status: DC | PRN
  Administered 2022-06-05 (×3): 80 ug via INTRAVENOUS

## 2022-06-05 MED ORDER — OXYCODONE HCL 5 MG PO TABS: 5.0000 mg | ORAL_TABLET | ORAL | Status: DC | PRN

## 2022-06-05 MED ORDER — NALOXONE HCL 4 MG/10ML IJ SOLN: 1.0000 ug/kg/h | INTRAVENOUS | Status: DC | PRN

## 2022-06-05 MED ORDER — ONDANSETRON HCL 4 MG/2ML IJ SOLN
INTRAMUSCULAR | Status: DC | PRN
  Administered 2022-06-05: 4 mg via INTRAVENOUS

## 2022-06-05 MED ORDER — KETOROLAC TROMETHAMINE 30 MG/ML IJ SOLN: 30.0000 mg | Freq: Four times a day (QID) | INTRAMUSCULAR | Status: DC | PRN

## 2022-06-05 MED ORDER — DIBUCAINE (PERIANAL) 1 % EX OINT: 1.0000 | TOPICAL_OINTMENT | CUTANEOUS | Status: DC | PRN

## 2022-06-05 MED ORDER — MEPERIDINE HCL 25 MG/ML IJ SOLN: 6.2500 mg | INTRAMUSCULAR | Status: DC | PRN

## 2022-06-05 MED ORDER — SIMETHICONE 80 MG PO CHEW
80.0000 mg | CHEWABLE_TABLET | Freq: Three times a day (TID) | ORAL | Status: DC
  Administered 2022-06-05 – 2022-06-08 (×11): 80 mg via ORAL
  Filled 2022-06-05 (×12): qty 1

## 2022-06-05 MED ORDER — SIMETHICONE 80 MG PO CHEW: 80.0000 mg | CHEWABLE_TABLET | ORAL | Status: DC | PRN

## 2022-06-05 MED ORDER — AMISULPRIDE (ANTIEMETIC) 5 MG/2ML IV SOLN: 10.0000 mg | Freq: Once | INTRAVENOUS | Status: DC | PRN

## 2022-06-05 MED ORDER — SUCCINYLCHOLINE CHLORIDE 200 MG/10ML IV SOSY
PREFILLED_SYRINGE | INTRAVENOUS | Status: DC | PRN
  Administered 2022-06-05: 200 mg via INTRAVENOUS

## 2022-06-05 MED ORDER — MIDAZOLAM HCL 2 MG/2ML IJ SOLN
INTRAMUSCULAR | Status: DC | PRN
  Administered 2022-06-05: 2 mg via INTRAVENOUS

## 2022-06-05 MED ORDER — ZOLPIDEM TARTRATE 5 MG PO TABS: 5.0000 mg | ORAL_TABLET | Freq: Every evening | ORAL | Status: DC | PRN

## 2022-06-05 MED ORDER — SODIUM CHLORIDE 0.9 % IR SOLN
Status: DC | PRN
  Administered 2022-06-05: 1

## 2022-06-05 MED ORDER — MIDAZOLAM HCL 2 MG/2ML IJ SOLN
INTRAMUSCULAR | Status: AC
  Filled 2022-06-05: qty 2

## 2022-06-05 MED ORDER — HYDROMORPHONE HCL 1 MG/ML IJ SOLN
INTRAMUSCULAR | Status: DC | PRN
  Administered 2022-06-05 (×2): 1 mg via INTRAVENOUS

## 2022-06-05 MED ORDER — FENTANYL CITRATE (PF) 100 MCG/2ML IJ SOLN
INTRAMUSCULAR | Status: AC
  Filled 2022-06-05: qty 2

## 2022-06-05 MED ORDER — PRENATAL MULTIVITAMIN CH
1.0000 | ORAL_TABLET | Freq: Every day | ORAL | Status: DC
  Administered 2022-06-05 – 2022-06-07 (×3): 1 via ORAL
  Filled 2022-06-05 (×3): qty 1

## 2022-06-05 MED ORDER — LACTATED RINGERS IV SOLN
INTRAVENOUS | Status: DC
  Administered 2022-06-05: 125 mL/h via INTRAVENOUS

## 2022-06-05 MED ORDER — SODIUM CHLORIDE 0.9% FLUSH
9.0000 mL | INTRAVENOUS | Status: DC | PRN
  Administered 2022-06-05: 9 mL via INTRAVENOUS

## 2022-06-05 MED ORDER — FENTANYL 50 MCG/ML IV PCA SOLN
INTRAVENOUS | Status: AC
  Filled 2022-06-05: qty 25

## 2022-06-05 MED ORDER — ENOXAPARIN SODIUM 60 MG/0.6ML IJ SOSY
50.0000 mg | PREFILLED_SYRINGE | INTRAMUSCULAR | Status: DC
  Administered 2022-06-06 – 2022-06-08 (×3): 50 mg via SUBCUTANEOUS
  Filled 2022-06-05 (×3): qty 0.6

## 2022-06-05 MED ORDER — SENNOSIDES-DOCUSATE SODIUM 8.6-50 MG PO TABS
2.0000 | ORAL_TABLET | Freq: Every day | ORAL | Status: DC
  Administered 2022-06-06 – 2022-06-08 (×3): 2 via ORAL
  Filled 2022-06-05 (×3): qty 2

## 2022-06-05 MED ORDER — DEXAMETHASONE SODIUM PHOSPHATE 10 MG/ML IJ SOLN
INTRAMUSCULAR | Status: DC | PRN
  Administered 2022-06-05: 4 mg via INTRAVENOUS

## 2022-06-05 MED ORDER — KETOROLAC TROMETHAMINE 30 MG/ML IJ SOLN
30.0000 mg | Freq: Four times a day (QID) | INTRAMUSCULAR | Status: AC
  Administered 2022-06-05 – 2022-06-06 (×4): 30 mg via INTRAVENOUS
  Filled 2022-06-05 (×4): qty 1

## 2022-06-05 MED ORDER — LACTATED RINGERS IV SOLN: INTRAVENOUS | Status: DC | PRN

## 2022-06-05 MED ORDER — DEXAMETHASONE SODIUM PHOSPHATE 10 MG/ML IJ SOLN
INTRAMUSCULAR | Status: AC
  Filled 2022-06-05: qty 1

## 2022-06-05 MED ORDER — HYDROMORPHONE HCL 1 MG/ML IJ SOLN
INTRAMUSCULAR | Status: AC
  Filled 2022-06-05: qty 2

## 2022-06-05 MED ORDER — NALOXONE HCL 0.4 MG/ML IJ SOLN: 0.4000 mg | INTRAMUSCULAR | Status: DC | PRN

## 2022-06-05 MED ORDER — ONDANSETRON HCL 4 MG/2ML IJ SOLN: 4.0000 mg | Freq: Four times a day (QID) | INTRAMUSCULAR | Status: DC | PRN

## 2022-06-05 MED ORDER — MAGNESIUM HYDROXIDE 400 MG/5ML PO SUSP
30.0000 mL | ORAL | Status: DC | PRN
  Administered 2022-06-07: 30 mL via ORAL
  Filled 2022-06-05: qty 30

## 2022-06-05 MED ORDER — GENTAMICIN SULFATE 40 MG/ML IJ SOLN
5.0000 mg/kg | INTRAVENOUS | Status: AC
  Administered 2022-06-06: 390 mg via INTRAVENOUS
  Filled 2022-06-05: qty 9.75

## 2022-06-05 MED ORDER — WITCH HAZEL-GLYCERIN EX PADS
1.0000 | MEDICATED_PAD | CUTANEOUS | Status: DC | PRN
  Administered 2022-06-08: 1 via TOPICAL

## 2022-06-05 MED ORDER — PROPOFOL 10 MG/ML IV BOLUS
INTRAVENOUS | Status: DC | PRN
  Administered 2022-06-05: 200 mg via INTRAVENOUS

## 2022-06-05 MED ORDER — SODIUM CHLORIDE 0.9% FLUSH: 3.0000 mL | INTRAVENOUS | Status: DC | PRN

## 2022-06-05 MED ORDER — KETOROLAC TROMETHAMINE 30 MG/ML IJ SOLN
INTRAMUSCULAR | Status: DC | PRN
  Administered 2022-06-05: 30 mg via INTRAVENOUS

## 2022-06-05 MED ORDER — DEXMEDETOMIDINE HCL IN NACL 80 MCG/20ML IV SOLN
INTRAVENOUS | Status: DC | PRN
  Administered 2022-06-05: 8 ug via BUCCAL
  Administered 2022-06-05: 16 ug via BUCCAL

## 2022-06-05 MED ORDER — ONDANSETRON HCL 4 MG/2ML IJ SOLN: 4.0000 mg | Freq: Three times a day (TID) | INTRAMUSCULAR | Status: DC | PRN

## 2022-06-05 MED ORDER — MENTHOL 3 MG MT LOZG: 1.0000 | LOZENGE | OROMUCOSAL | Status: DC | PRN

## 2022-06-05 MED ORDER — LIDOCAINE HCL (CARDIAC) PF 100 MG/5ML IV SOSY
PREFILLED_SYRINGE | INTRAVENOUS | Status: DC | PRN
  Administered 2022-06-05: 40 mg via INTRAVENOUS

## 2022-06-05 MED ORDER — ACETAMINOPHEN 10 MG/ML IV SOLN
INTRAVENOUS | Status: DC | PRN
  Administered 2022-06-05: 1000 mg via INTRAVENOUS

## 2022-06-05 MED ORDER — ACETAMINOPHEN 500 MG PO TABS
1000.0000 mg | ORAL_TABLET | Freq: Four times a day (QID) | ORAL | Status: DC
  Administered 2022-06-05 – 2022-06-08 (×12): 1000 mg via ORAL
  Filled 2022-06-05 (×12): qty 2

## 2022-06-05 MED ORDER — STERILE WATER FOR IRRIGATION IR SOLN
Status: DC | PRN
  Administered 2022-06-05: 1

## 2022-06-05 MED ORDER — ONDANSETRON HCL 4 MG/2ML IJ SOLN
INTRAMUSCULAR | Status: AC
  Filled 2022-06-05: qty 2

## 2022-06-05 MED ORDER — IBUPROFEN 600 MG PO TABS
600.0000 mg | ORAL_TABLET | Freq: Four times a day (QID) | ORAL | Status: DC
  Administered 2022-06-06 – 2022-06-08 (×8): 600 mg via ORAL
  Filled 2022-06-05 (×9): qty 1

## 2022-06-05 MED ORDER — HYDROMORPHONE HCL 1 MG/ML IJ SOLN: 0.2500 mg | INTRAMUSCULAR | Status: DC | PRN

## 2022-06-05 MED ORDER — TRANEXAMIC ACID-NACL 1000-0.7 MG/100ML-% IV SOLN
INTRAVENOUS | Status: DC | PRN
  Administered 2022-06-05: 1000 mg via INTRAVENOUS

## 2022-06-05 MED ORDER — ONDANSETRON HCL 4 MG/2ML IJ SOLN: 4.0000 mg | Freq: Once | INTRAMUSCULAR | Status: DC | PRN

## 2022-06-05 MED ORDER — OXYCODONE HCL 5 MG/5ML PO SOLN: 5.0000 mg | Freq: Once | ORAL | Status: DC | PRN

## 2022-06-05 SURGICAL SUPPLY — 42 items
APL SKNCLS STERI-STRIP NONHPOA (GAUZE/BANDAGES/DRESSINGS) ×1
BENZOIN TINCTURE PRP APPL 2/3 (GAUZE/BANDAGES/DRESSINGS) ×2 IMPLANT
CHLORAPREP W/TINT 26ML (MISCELLANEOUS) ×4 IMPLANT
CLAMP UMBILICAL CORD (MISCELLANEOUS) ×2 IMPLANT
CLOTH BEACON ORANGE TIMEOUT ST (SAFETY) ×2 IMPLANT
DRAPE C SECTION CLR SCREEN (DRAPES) IMPLANT
DRSG OPSITE POSTOP 4X10 (GAUZE/BANDAGES/DRESSINGS) ×2 IMPLANT
DRSG TELFA 3X8 NADH STRL (GAUZE/BANDAGES/DRESSINGS) IMPLANT
ELECT REM PT RETURN 9FT ADLT (ELECTROSURGICAL) ×1
ELECTRODE REM PT RTRN 9FT ADLT (ELECTROSURGICAL) ×2 IMPLANT
EXTRACTOR VACUUM M CUP 4 TUBE (SUCTIONS) IMPLANT
GAUZE SPONGE 4X4 12PLY STRL LF (GAUZE/BANDAGES/DRESSINGS) IMPLANT
GLOVE BIO SURGEON STRL SZ7.5 (GLOVE) ×2 IMPLANT
GLOVE BIOGEL PI IND STRL 7.0 (GLOVE) ×4 IMPLANT
GOWN STRL REUS W/TWL 2XL LVL3 (GOWN DISPOSABLE) ×2 IMPLANT
GOWN STRL REUS W/TWL LRG LVL3 (GOWN DISPOSABLE) ×4 IMPLANT
HEMOSTAT ARISTA ABSORB 3G PWDR (HEMOSTASIS) IMPLANT
KIT ABG SYR 3ML LUER SLIP (SYRINGE) IMPLANT
NDL HYPO 25X5/8 SAFETYGLIDE (NEEDLE) IMPLANT
NEEDLE HYPO 22GX1.5 SAFETY (NEEDLE) ×2 IMPLANT
NEEDLE HYPO 25X5/8 SAFETYGLIDE (NEEDLE) IMPLANT
NS IRRIG 1000ML POUR BTL (IV SOLUTION) ×2 IMPLANT
PACK C SECTION WH (CUSTOM PROCEDURE TRAY) ×2 IMPLANT
PAD ABD 7.5X8 STRL (GAUZE/BANDAGES/DRESSINGS) IMPLANT
PAD OB MATERNITY 4.3X12.25 (PERSONAL CARE ITEMS) ×2 IMPLANT
RTRCTR C-SECT PINK 25CM LRG (MISCELLANEOUS) ×2 IMPLANT
STRIP CLOSURE SKIN 1/2X4 (GAUZE/BANDAGES/DRESSINGS) ×2 IMPLANT
SUT CHROMIC 1 CTX 36 (SUTURE) ×4 IMPLANT
SUT PLAIN 0 NONE (SUTURE) IMPLANT
SUT VIC AB 1 CT1 36 (SUTURE) ×2 IMPLANT
SUT VIC AB 2-0 CT1 (SUTURE) ×2 IMPLANT
SUT VIC AB 2-0 CT1 27 (SUTURE) ×1
SUT VIC AB 2-0 CT1 TAPERPNT 27 (SUTURE) ×2 IMPLANT
SUT VIC AB 3-0 CT1 27 (SUTURE) ×1
SUT VIC AB 3-0 CT1 TAPERPNT 27 (SUTURE) ×2 IMPLANT
SUT VIC AB 3-0 SH 27 (SUTURE)
SUT VIC AB 3-0 SH 27X BRD (SUTURE) IMPLANT
SUT VIC AB 4-0 KS 27 (SUTURE) ×2 IMPLANT
SYR BULB IRRIGATION 50ML (SYRINGE) IMPLANT
TOWEL OR 17X24 6PK STRL BLUE (TOWEL DISPOSABLE) ×2 IMPLANT
TRAY FOLEY W/BAG SLVR 14FR LF (SET/KITS/TRAYS/PACK) ×2 IMPLANT
WATER STERILE IRR 1000ML POUR (IV SOLUTION) ×2 IMPLANT

## 2022-06-05 NOTE — Transfer of Care (Signed)
Immediate Anesthesia Transfer of Care Note  Patient: Melissa [name]  Procedure(s) Performed: CESAREAN SECTION  Patient Location: PACU  Anesthesia Type:General  Level of Consciousness: awake, drowsy and patient cooperative  Airway & Oxygen Therapy: Patient Spontanous Breathing and Patient connected to nasal cannula oxygen  Post-op Assessment: Report given to RN and Post -op Vital signs reviewed and stable  Post vital signs: Reviewed and stable  Last Vitals:  Vitals Value Taken Time  BP    Temp    Pulse 110 06/05/22 0537  Resp 15 06/05/22 0537  SpO2 98 % 06/05/22 0537  Vitals shown include unvalidated device data.  Last Pain:  Vitals:   06/05/22 0329  TempSrc: Oral  PainSc:       Patients Stated Pain Goal: 3 (34/19/37 9024)  Complications: No notable events documented.

## 2022-06-05 NOTE — Lactation Note (Signed)
This note was copied from a baby's chart. Lactation Consultation Note I spoke briefly with Ms. Vanzandt and her family. She was not presently feeling well enough for a lactation consultation. Her mother mentioned that she pumped briefly with her last child but was unsure about her intention to pump this time because of her own current medical conditions. I offered to return at a later time to further discuss and assist prn. Family is interested in donor milk for infant.   Patient Name: Melissa [name] [id number]'U Date: 06/05/2022   Age:57 hours    Gwynne Edinger 06/05/2022, 8:39 AM

## 2022-06-05 NOTE — Discharge Summary (Signed)
Postpartum Discharge Summary  Date of Service updated 06/08/22    Patient Name: Melissa [name] DOB: 08-Mar-2001 MRN: [id number]  Date of admission: 05/28/2022 Delivery date:06/05/2022  Delivering provider: Chancy Milroy  Date of discharge: 06/08/2022  Admitting diagnosis: Vaginal bleeding during pregnancy [O46.90] Intrauterine pregnancy: [redacted]w[redacted]d    Secondary diagnosis:  Principal Problem:   Delivery by emergency cesarean Active Problems:   Rh negative state in antepartum period   Short interval between pregnancies affecting pregnancy in second trimester, antepartum   Supervision of high risk pregnancy, antepartum   History of herpes genitalis   Preterm premature rupture of membranes (PPROM) with unknown onset of labor   ?fetal pericardial effusion   Vaginal bleeding during pregnancy   Umbilical cord prolapse  Additional problems: none    Discharge diagnosis: Preterm Pregnancy Delivered                                              Post partum procedures: none Augmentation: N/A Complications: Intrauterine Inflammation or infection (Chorioamniotis), [facility]: Onset of Labor With Unplanned C/S   21y.o. yo [phone] [id number] admitted in due to vaginal bleeding and concern for PPROM on 05/28/2022. She had previously been admitted and had already received BMZ, magnesium and latency antibiotics.  She was monitored in house and on  10/15 started to have pain and signs of intra-amniotic infection. She was counseled and scheduled for a repeat c/s. However, before the scheduled c/s, pt had a cord prolapse. The patient went for STAT cesarean section due to Cord Prolapse. Delivery details as follows: Membrane Rupture Time/Date:  ,05/17/2022   Delivery Method:C-Section, Low Transverse  Details of operation can be found in separate operative note. Patient had an uncomplicated postpartum course  She is ambulating,tolerating a regular diet, passing flatus, and urinating  well.  Patient is discharged home in stable condition 06/08/22.  Newborn Data: Birth date:06/05/2022  Birth time:4:23 AM  Gender:Female  Living status:Living  Apgars:1 ,4  Weight:1070 g   Magnesium Sulfate received: Yes: Neuroprotection BMZ received: Yes Rhophylac:Yes MMR:N/A T-DaP:offered to patient Flu: Yes Transfusion:No  Physical exam  Vitals:   06/07/22 1512 06/07/22 2015 06/08/22 0330 06/08/22 0748  BP: (!) 98/58 102/61 (!) 98/50 (!) 97/52  Pulse: 84 92 77 74  Resp: '16 17 16 16  ' Temp: 98.1 F (36.7 C) 98.1 F (36.7 C) 98.6 F (37 C) 98.1 F (36.7 C)  TempSrc: Oral Oral Oral Oral  SpO2: 99% 100% 100% 100%  Weight:      Height:       General: alert, cooperative, and no distress Lochia: appropriate Uterine Fundus: firm Incision: Healing well with no significant drainage DVT Evaluation: No evidence of DVT seen on physical exam. Labs: Lab Results  Component Value Date   WBC 19.2 (H) 06/06/2022   HGB 7.7 (L) 06/06/2022   HCT 23.8 (L) 06/06/2022   MCV 78.5 (L) 06/06/2022   PLT 163 06/06/2022      Latest Ref Rng & Units 05/17/2022   10:22 PM  CMP  Glucose 70 - 99 mg/dL 86   BUN 6 - 20 mg/dL 7   Creatinine 0.44 - 1.00 mg/dL 0.62   Sodium 135 - 145 mmol/L 136   Potassium 3.5 - 5.1 mmol/L 3.7   Chloride 98 - 111 mmol/L 108   CO2 22 - 32 mmol/L  22   Calcium 8.9 - 10.3 mg/dL 8.6   Total Protein 6.5 - 8.1 g/dL 6.5   Total Bilirubin 0.3 - 1.2 mg/dL 0.1   Alkaline Phos 38 - 126 U/L 76   AST 15 - 41 U/L 17   ALT 0 - 44 U/L 10    Edinburgh Score:    11/22/2021    2:32 PM  Edinburgh Postnatal Depression Scale Screening Tool  I have been able to laugh and see the funny side of things. 0  I have looked forward with enjoyment to things. 0  I have blamed myself unnecessarily when things went wrong. 0  I have been anxious or worried for no good reason. 3  I have felt scared or panicky for no good reason. 3  Things have been getting on top of me. 1  I have been so  unhappy that I have had difficulty sleeping. 0  I have felt sad or miserable. 0  I have been so unhappy that I have been crying. 0  The thought of harming myself has occurred to me. 0  Edinburgh Postnatal Depression Scale Total 7     After visit meds:  Allergies as of 06/08/2022       Reactions   Benadryl [diphenhydramine Hcl] Other (See Comments)   Causes seizures   Shellfish Allergy Anaphylaxis   Patient states her throat gets tight   Zithromax [azithromycin] Anaphylaxis, Swelling   Haldol [haloperidol] Swelling   Tongue swelling.   Lactose Intolerance (gi) Diarrhea   Latex Swelling   Labial edema after usage of latex condoms.         Medication List     STOP taking these medications    terconazole 0.4 % vaginal cream Commonly known as: TERAZOL 7       TAKE these medications    acetaminophen 325 MG tablet Commonly known as: Tylenol Take 2 tablets (650 mg total) by mouth every 6 (six) hours as needed.   Blood Pressure Kit Devi 1 kit by Does not apply route once a week.   docusate sodium 100 MG capsule Commonly known as: COLACE Take 1 capsule (100 mg total) by mouth 2 (two) times daily as needed for mild constipation.   ferrous sulfate 325 (65 FE) MG tablet Take 1 tablet (325 mg total) by mouth at bedtime.   Gojji Weight Scale Misc 1 Device by Does not apply route every 30 (thirty) days.   hydrOXYzine 25 MG tablet Commonly known as: ATARAX TAKE 1 TABLET BY MOUTH 3 TIMES DAILY AS NEEDED FOR ANXIETY.   ibuprofen 600 MG tablet Commonly known as: ADVIL Take 1 tablet (600 mg total) by mouth every 6 (six) hours.   oxyCODONE 5 MG immediate release tablet Commonly known as: Oxy IR/ROXICODONE Take 1 tablet (5 mg total) by mouth every 6 (six) hours as needed for severe pain.   PrePLUS 27-1 MG Tabs Take 1 tablet by mouth daily.   valACYclovir 1000 MG tablet Commonly known as: VALTREX Take 1 tablet (1,000 mg total) by mouth 2 (two) times daily.         Discharge home in stable condition Infant Feeding:  plan to pump, baby in NICU Infant Disposition:NICU Discharge instruction: per After Visit Summary and Postpartum booklet. Activity: Advance as tolerated. Pelvic rest for 6 weeks.  Diet: routine diet Future Appointments: Future Appointments  Date Time Provider [place]  06/12/2022  1:20 PM [place] None  07/06/2022  3:50 PM Griffin Basil, MD  [place] None    Follow up Visit:  Nitro. Go in 1 week(s).   Specialty: Obstetrics and Gynecology Why: Follow up as scheduled on 10/23 Contact information: [address] [place] [phone]               Message sent to [facility] on 10/16  Please schedule this patient for a In person postpartum visit in 4 weeks with the following provider: Any provider. Additional Postpartum F/U:Postpartum Depression checkup and Incision check 1 week  High risk pregnancy complicated by:  Short interval pregnancy, HSV, PPROM Delivery mode:  C-Section, Low Transverse  Anticipated Birth Control:  Plans Interval BTL   06/08/2022 Annalee Genta, DO

## 2022-06-05 NOTE — Anesthesia Procedure Notes (Signed)
Procedure Name: Intubation Date/Time: 06/05/2022 4:21 AM  Performed by: Sammie Bench, CRNAPre-anesthesia Checklist: Patient identified, Emergency Drugs available, Suction available, Patient being monitored and Timeout performed Patient Re-evaluated:Patient Re-evaluated prior to induction Oxygen Delivery Method: Circle system utilized Preoxygenation: Pre-oxygenation with 100% oxygen Induction Type: Rapid sequence and IV induction Laryngoscope Size: Glidescope and 3 Tube type: Oral Tube size: 7.0 mm Number of attempts: 1 Airway Equipment and Method: Video-laryngoscopy and Stylet

## 2022-06-05 NOTE — Anesthesia Preprocedure Evaluation (Signed)
Anesthesia Evaluation  Patient identified by MRN, date of birth, ID band Patient awake    Reviewed: Allergy & Precautions, Patient's Chart, lab work & pertinent test resultsPreop documentation limited or incomplete due to emergent nature of procedure.  Airway Mallampati: III  TM Distance: >3 FB Neck ROM: Full    Dental  (+) Dental Advisory Given   Pulmonary asthma , Current Smoker   Pulmonary exam normal breath sounds clear to auscultation       Cardiovascular negative cardio ROS Normal cardiovascular exam Rhythm:Regular Rate:Normal     Neuro/Psych  Headaches, Seizures -, Well Controlled,  PSYCHIATRIC DISORDERS Anxiety Depression       GI/Hepatic negative GI ROS, Neg liver ROS,,,  Endo/Other    Morbid obesityBMI 40  Renal/GU negative Renal ROS  negative genitourinary   Musculoskeletal negative musculoskeletal ROS (+)    Abdominal  (+) + obese  Peds negative pediatric ROS (+)  Hematology  (+) Blood dyscrasia, Sickle cell trait and anemia Hb 9.7, plt 169   Anesthesia Other Findings   Reproductive/Obstetrics (+) Pregnancy Multip, has been on OB specialty care for multiple weeks ruptured, 26 weeks Code ceasaren for cord prolaps                              Anesthesia Physical Anesthesia Plan  ASA: 3 and emergent  Anesthesia Plan: General   Post-op Pain Management: Ofirmev IV (intra-op)* and Toradol IV (intra-op)*   Induction: Intravenous  PONV Risk Score and Plan: 3 and Treatment may vary due to age or medical condition, Midazolam, Dexamethasone and Ondansetron  Airway Management Planned: Oral ETT and Video Laryngoscope Planned  Additional Equipment: None  Intra-op Plan:   Post-operative Plan: Extubation in OR  Informed Consent:      Only emergency history available  Plan Discussed with: CRNA  Anesthesia Plan Comments:        Anesthesia Quick Evaluation

## 2022-06-05 NOTE — Consult Note (Signed)
ANTIBIOTIC CONSULT NOTE - INITIAL  Pharmacy Consult for Gentamicin Indication: postpartum endometritis  Allergies  Allergen Reactions   Benadryl [Diphenhydramine Hcl] Other (See Comments)    Causes seizures    Shellfish Allergy Anaphylaxis    Patient states her throat gets tight   Zithromax [Azithromycin] Anaphylaxis and Swelling   Haldol [Haloperidol] Swelling    Tongue swelling.   Lactose Intolerance (Gi) Diarrhea   Latex Swelling    Labial edema after usage of latex condoms.     Patient Measurements: Height: 5\' 6"  (167.6 cm) Weight: 112 kg (246 lb 14.6 oz) IBW/kg (Calculated) : 59.3 Adjusted Body Weight: 78.2 kg   Vital Signs: Temp: 97.1 F (36.2 C) (10/16 1156) Temp Source: Axillary (10/16 1156) BP: 91/51 (10/16 1031) Pulse Rate: 72 (10/16 1031)  Labs: Recent Labs    06/04/22 0511  WBC 9.3  HGB 9.7*  PLT 169   No results for input(s): "GENTTROUGH", "GENTPEAK", "GENTRANDOM" in the last 72 hours.   Microbiology: Recent Results (from the past 720 hour(s))  Wet prep, genital     Status: Abnormal   Collection Time: 05/17/22  9:05 PM  Result Value Ref Range Status   Yeast Wet Prep HPF POC NONE SEEN NONE SEEN Final   Trich, Wet Prep PRESENT (A) NONE SEEN Final   Clue Cells Wet Prep HPF POC PRESENT (A) NONE SEEN Final   WBC, Wet Prep HPF POC >=10 (A) <10 Final   Sperm NONE SEEN  Final    Comment: Performed at [facility], [address], [place], [place]  Resp Panel by RT-PCR (Flu A&B, Covid) Anterior Nasal Swab     Status: None   Collection Time: 06/03/22  5:30 PM   Specimen: Anterior Nasal Swab  Result Value Ref Range Status   SARS Coronavirus 2 by RT PCR NEGATIVE NEGATIVE Final    Comment: (NOTE) SARS-CoV-2 target nucleic acids are NOT DETECTED.  The SARS-CoV-2 RNA is generally detectable in upper respiratory specimens during the acute phase of infection. The lowest concentration of SARS-CoV-2 viral copies this assay can detect is 138  copies/mL. A negative result does not preclude SARS-Cov-2 infection and should not be used as the sole basis for treatment or other patient management decisions. A negative result may occur with  improper specimen collection/handling, submission of specimen other than nasopharyngeal swab, presence of viral mutation(s) within the areas targeted by this assay, and inadequate number of viral copies(<138 copies/mL). A negative result must be combined with clinical observations, patient history, and epidemiological information. The expected result is Negative.  Fact Sheet for Patients:  06/05/22  Fact Sheet for Healthcare Providers:  BloggerCourse.com  This test is no t yet approved or cleared by the [place] FDA and  has been authorized for detection and/or diagnosis of SARS-CoV-2 by FDA under an Emergency Use Authorization (EUA). This EUA will remain  in effect (meaning this test can be used) for the duration of the COVID-19 declaration under Section 564(b)(1) of the Act, 21 U.S.C.section 360bbb-3(b)(1), unless the authorization is terminated  or revoked sooner.       Influenza A by PCR NEGATIVE NEGATIVE Final   Influenza B by PCR NEGATIVE NEGATIVE Final    Comment: (NOTE) The Xpert Xpress SARS-CoV-2/FLU/RSV plus assay is intended as an aid in the diagnosis of influenza from Nasopharyngeal swab specimens and should not be used as a sole basis for treatment. Nasal washings and aspirates are unacceptable for Xpert Xpress SARS-CoV-2/FLU/RSV testing.  Fact Sheet  for Patients: EntrepreneurPulse.com.au  Fact Sheet for Healthcare Providers: IncredibleEmployment.be  This test is not yet approved or cleared by the [place] FDA and has been authorized for detection and/or diagnosis of SARS-CoV-2 by FDA under an Emergency Use Authorization (EUA). This EUA will remain in effect (meaning  this test can be used) for the duration of the COVID-19 declaration under Section 564(b)(1) of the Act, 21 U.S.C. section 360bbb-3(b)(1), unless the authorization is terminated or revoked.  Performed at [facility], [place] [address] [place], [place] [address]     Medications:  Ampicillin 2 g every 6 hours   Assessment: 21 y.o. female [phone] at [redacted]w[redacted]d is now POD0 from repeat low transverse c-section and requires ampicillin and gentamicin for postpartum endometritis treatment.   Goal of Therapy:  Gentamicin peak 6-8 mg/L and Trough < 1 mg/L  Plan:  Gentamicin 390 mg IV x 1 given 10/16 @0147  Schedule gentamicin 390 mg IV every 24 hrs starting 10/17 @0130  Check Scr with next labs (10/17) if gentamicin continued. Will check gentamicin levels if continued > 72hr or clinically indicated.  Vira Agar 06/05/2022,12:05 PM

## 2022-06-05 NOTE — Op Note (Cosign Needed)
Nixie Laube [name] PROCEDURE DATE: 06/05/2022  PREOPERATIVE DIAGNOSES: Intrauterine pregnancy at [redacted]w[redacted]d weeks gestation;  Cord prolapse  POSTOPERATIVE DIAGNOSES: The same, viable infant delivered  PROCEDURE: STAT Repeat Low Transverse Cesarean Section  SURGEON:  Dr. Arlina Robes  ASSISTANT:  Shelda Pal, DO An experienced assistant was required given the standard of surgical care given the complexity of the case.  This assistant was needed for exposure, dissection, suctioning, retraction, instrument exchange, assisting with delivery with administration of fundal pressure, and for overall help during the procedure.  ANESTHESIOLOGY TEAM: Anesthesiologist: Pervis Hocking, DO CRNA: Sandrea Matte, CRNA; Sammie Bench, CRNA  INDICATIONS: Melissa [name] is a 21 y.o. [phone] at [redacted]w[redacted]d here for cesarean section secondary to the indications listed under preoperative diagnoses; please see preoperative note for further details.  The risks of surgery were discussed with the patient including but were not limited to: bleeding which may require transfusion or reoperation; infection which may require antibiotics; injury to bowel, bladder, ureters or other surrounding organs; injury to the fetus; need for additional procedures including hysterectomy in the event of a life-threatening hemorrhage; formation of adhesions; placental abnormalities wth subsequent pregnancies; incisional problems; thromboembolic phenomenon and other postoperative/anesthesia complications.  The patient concurred with the proposed plan, giving informed written consent for the procedure.    FINDINGS:  Viable female infant in cephalic presentation.  Apgars pending.  Amniotic fluid: clear.  Intact placenta, three vessel cord.  Normal uterus, fallopian tubes and ovaries bilaterally.  ANESTHESIA: general INTRAVENOUS FLUIDS: 1400 ml   ESTIMATED BLOOD LOSS: 926 ml URINE OUTPUT:  600 ml SPECIMENS:  Placenta sent to pathology . COMPLICATIONS: None immediate  PROCEDURE IN DETAIL:  The patient preoperatively received intravenous antibiotics and had sequential compression devices applied to her lower extremities.  She was then taken to the operating room where she was placed under general anesthesia. She was then placed in a dorsal supine position with a leftward tilt, and prepped and draped in a sterile manner.  A foley catheter was  placed into her bladder and attached to constant gravity.  After an adequate timeout was performed, a Pfannenstiel skin incision was made with scalpel and carried through to the underlying layer of fascia. The fascia was incised in the midline, and this incision was extended bluntly. The rectus muscles were separated in the midline and the peritoneum was entered bluntly.   The Alexis self-retaining retractor was introduced into the abdominal cavity.  Attention was turned to the lower uterine segment where a low transverse hysterotomy was made with a scalpel and extended bluntly in caudad and cephalad directions.  The infant was successfully delivered, the cord was clamped and cut immediately, and the infant was handed over to the awaiting neonatology team. Uterine massage was then administered, and the placenta delivered intact with a three-vessel cord. The uterus was then exteriorized  and cleared of clots and debris.  The hysterotomy was closed with 1-Chromic in a running fashion.  Figure-of-eight 1-Chromic serosal stitches were placed to help with hemostasis.   The pelvis was cleared of all clot and debris. Hemostasis was confirmed on all surfaces. The uterus was returned to the abdomen and the incision was once again inspected and found to be hemostatic. The retractor was removed.  The peritoneum was closed with a 2-0 Vicryl running stitch. The fascia was then closed using 0 Vicryl in a running fashion.  The subcutaneous layer was irrigated, any areas of bleeding were  cauterized with the bovie,  was reapproximated with 3-0 vicryl in a running fashion, was found to be hemostatic. . The skin was closed with a 4-0 Vicryl subcuticular stitch. The patient tolerated the procedure well. Sponge, instrument and needle counts were correct x 3.  She was taken to the recovery room in stable condition.   Shelda Pal, [place] Fellow, Faculty practice [place] for [facility] 06/05/22  5:51 AM

## 2022-06-05 NOTE — Anesthesia Postprocedure Evaluation (Signed)
Anesthesia Post Note  Patient: Melissa [name]  Procedure(s) Performed: CESAREAN SECTION     Patient location during evaluation: PACU Anesthesia Type: General Level of consciousness: awake and alert, oriented and patient cooperative Pain management: pain level controlled Vital Signs Assessment: post-procedure vital signs reviewed and stable Respiratory status: spontaneous breathing, nonlabored ventilation and respiratory function stable Cardiovascular status: blood pressure returned to baseline and stable Postop Assessment: no apparent nausea or vomiting Anesthetic complications: no   No notable events documented.  Last Vitals:  Vitals:   06/05/22 0329 06/05/22 0530  BP:  (!) 114/52  Pulse:    Resp:    Temp: (!) 38.1 C   SpO2:      Last Pain:  Vitals:   06/05/22 0329  TempSrc: Oral  PainSc:    Pain Goal: Patients Stated Pain Goal: 3 (06/04/22 0805)                 Pervis Hocking

## 2022-06-05 NOTE — Progress Notes (Signed)
Pt transferred to L&D for IOL now that she is dx with intra-amniotic infection. On evaluation noted fetus is transverse breech. Upon discussion of delivery options of ECV vs repeat c-section with risks and benefits of each option, pt opted for repeat C/s with BTL.   Melissa [name] has agreed to proceed with cesarean section due to Malpresentation transverse. The risks of surgery were discussed with the patient including but were not limited to: bleeding which may require transfusion or reoperation; infection which may require antibiotics; injury to bowel, bladder, ureters or other surrounding organs; injury to the fetus; need for additional procedures including hysterectomy in the event of a life-threatening hemorrhage; formation of adhesions; placental abnormalities wth subsequent pregnancies; incisional problems; thromboembolic phenomenon and other postoperative/anesthesia complications. The patient concurred with the proposed plan, giving informed consent for the procedure. Patient will be NPO for 12 hours for procedure. Anesthesia and OR aware. Preoperative prophylactic antibiotics and SCDs ordered on call to the OR. C/s sch for 06/05/22 at 1115.   Triple I: Amp/Gent ongoing NICU aware of delivery  Shelda Pal, DO [facility] Fellow, Faculty practice [place] for [place] 06/05/22  1:46 AM

## 2022-06-06 LAB — CBC
HCT: 23.8 % — ABNORMAL LOW (ref 36.0–46.0)
Hemoglobin: 7.7 g/dL — ABNORMAL LOW (ref 12.0–15.0)
MCH: 25.4 pg — ABNORMAL LOW (ref 26.0–34.0)
MCHC: 32.4 g/dL (ref 30.0–36.0)
MCV: 78.5 fL — ABNORMAL LOW (ref 80.0–100.0)
Platelets: 163 10*3/uL (ref 150–400)
RBC: 3.03 MIL/uL — ABNORMAL LOW (ref 3.87–5.11)
RDW: 16.7 % — ABNORMAL HIGH (ref 11.5–15.5)
WBC: 19.2 10*3/uL — ABNORMAL HIGH (ref 4.0–10.5)
nRBC: 0.3 % — ABNORMAL HIGH (ref 0.0–0.2)

## 2022-06-06 LAB — SURGICAL PATHOLOGY

## 2022-06-06 MED ORDER — SODIUM CHLORIDE 0.9 % IV SOLN
500.0000 mg | Freq: Once | INTRAVENOUS | Status: AC
  Administered 2022-06-06: 500 mg via INTRAVENOUS
  Filled 2022-06-06 (×3): qty 25

## 2022-06-06 MED ORDER — RHO D IMMUNE GLOBULIN 1500 UNIT/2ML IJ SOSY
300.0000 ug | PREFILLED_SYRINGE | Freq: Once | INTRAMUSCULAR | Status: AC
  Administered 2022-06-06: 300 ug via INTRAVENOUS
  Filled 2022-06-06: qty 2

## 2022-06-06 NOTE — Lactation Note (Signed)
This note was copied from a baby's chart.  NICU Lactation Consultation Note  Patient Name: Melissa [name] GSUPJ'S Date: 06/06/2022 Age:23 hours   Subjective Reason for consult: Initial assessment LC attempted initial consult but pt remains too unwell for teaching. She recalls that she pumped once yesterday but has not felt well enough to pump today. We briefly discussed pumping q3h  when she feels well enough. I faxed pump request to [facility].  Objective Infant data:   Maternal data: [id number]  C-Section, Low Transverse  Risk factor for low milk supply:: delay in pumping, pph   WIC Program: Yes WIC Referral Sent?: Yes  Assessment Infant: Feeding Status: NPO   Intervention/Plan Interventions: Education  Tools: Pump  Plan: Consult Status: NICU follow-up  NICU Follow-up type: New admission follow up    Gwynne Edinger 06/06/2022, 3:46 PM

## 2022-06-06 NOTE — Progress Notes (Signed)
POSTPARTUM PROGRESS NOTE  POD #1  Subjective:  Melissa [name] is a 21 y.o. [phone] s/p primary LTCS at [redacted]w[redacted]d.  She reports she doing well. No acute events overnight. . She denies any problems with ambulating, voiding or po intake. Denies nausea or vomiting. She has  passed flatus. Pain is well controlled.  Lochia is normal.  Objective: Blood pressure (!) 89/57, pulse 74, temperature 97.7 F (36.5 C), temperature source Oral, resp. rate 16, height 5\' 6"  (1.676 m), weight 112 kg, last menstrual period 12/03/2021, SpO2 100 %, unknown if currently breastfeeding.  Physical Exam:  General: alert, cooperative and no distress Chest: no respiratory distress, CTA bilaterally Heart:regular rate and rhythm, distal pulses intact Abdomen: soft, nontender, nondistended , positive bowel sounds Uterine Fundus: firm, appropriately tender DVT Evaluation: No calf swelling or tenderness Extremities: trace edema, SCDs in place Skin: warm, dry; incision clean/dry/intact w/ honeycomb/pressure dressing in place  Recent Labs    06/04/22 0511 06/06/22 0447  HGB 9.7* 7.7*  HCT 29.0* 23.8*    Assessment/Plan: Melissa [name] is a 21 y.o. [id number] s/p primary LTCS at [redacted]w[redacted]d for cord prolapse.  POD#1 -  Contraception: interval BTL Feeding: bottle  Encourage ambulation Monitor bowel function IV iron (venofer) to address anemia If no complications anticipate discharge on 06/07/22  LOS: 9 days   Lynnda Shields, [place] Attending, Center for [employer] 06/06/2022, 7:57 AM

## 2022-06-06 NOTE — Clinical Social Work Maternal (Addendum)
CLINICAL SOCIAL WORK MATERNAL/CHILD NOTE  Patient Details  Name: Melissa [name] MRN: [id number] Date of Birth: 05-31-2001  Date:  06/06/2022  Clinical Social Worker Initiating Note:  Laurey Arrow Date/Time: Initiated:  06/06/22/1123     Child's Name:  Melissa [name]   Biological Parents:  Mother (MOB declined to provide any information about FOB.)   Need for Interpreter:  None (Traumatic birth experience)   Reason for Referral:  Behavioral Health Concerns, Parental Support of Premature Babies < 9 weeks/or Critically Ill babies, Other (Comment)   Address:  [place] [address]    Phone number:  [phone] (home)     Additional phone number:   Household Members/Support Persons (HM/SP):   Household Member/Support Person 1, Household Member/Support Person 2   HM/SP Name Relationship DOB or Age  HM/SP -1 [name] Kellam-Wallace daughter 08/20/2019  HM/SP -2 Arielle Kellam-Wallace daughter 08/31/2021  HM/SP -3        HM/SP -4        HM/SP -5        HM/SP -6        HM/SP -7        HM/SP -8          Natural Supports (not living in the home):  Extended Family, Immediate Family, Parent   Professional Supports: None   Employment: Unemployed   Type of Work:     Education:  Programmer, systems   Homebound arranged:    Museum/gallery curator Resources:      Other Resources:  Physicist, medical  , [facility]   Cultural/Religious Considerations Which May Impact Care:    Strengths:  Ability to meet basic needs  , Pediatrician chosen, Home prepared for child  , Compliance with medical plan  , Understanding of illness, Psychotropic Medications   Psychotropic Medications:  Other meds (Vistaril)      Pediatrician:    [place] area  Pediatrician List:   [facility] for [place]      Pediatrician Fax Number:    Risk Factors/Current  Problems:  Mental Health Concerns     Cognitive State:  Linear Thinking  , Insightful  , Alert  , Able to Concentrate     Mood/Affect:  Relaxed  , Sad  , Flat  , Interested  , Calm  , Comfortable     CSW Assessment: CSW met with MOB and MOB's mother in room 110 to complete an assessment for NICU admission and MH hx. When CSW arrived, MOB was resting in the recliner.  Without prompting, MOB shared that she was not feeling well and since the last time s CSW saw MOB she had a traumatic birth experience. CSW offered to return at a later time and MOB declined. MOB gave CSW permission to complete the clinical assessment while MOB's mother was present.  MOB engaged with CSW and appeared to be a support to MOB.   CSW asked about MOB's labor and delivery.  MOB shared the events that lead up to [facility] emergency C-Section. MOB shared feeling traumatized with her experience and wanting to speak with someone post discharge.  CSW provided MOB with contact information to patient experience. Per MOB she feels well informed about infant's health and she communicated feeling updated by the NICU team.   CSW asked MOB's MH hx.  MOB acknowledged a dx of anx/dep.  Per MOB and MOB's mother, she was dx around age 21 and her medication has been managing her symptoms. 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 CSW provided education regarding the baby blues period vs. perinatal mood disorders, discussed treatment and gave resources for mental health follow up if concerns arise.  CSW recommends self-evaluation during the postpartum time period using the New Mom Checklist from Postpartum Progress and encouraged MOB to contact a medical professional if symptoms are noted at any time.  MOB did not present with any acute symptoms and when assessed for safety MOB denied SI, HI, and DV. MOB shared having a good support team and feeling comfortable seeking  additional help if needed.   Per MOB and MOB's mother, the family has all essential items to care for infant post discharge including a new car seat and a bassinet.   CSW provided review of Sudden Infant Death Syndrome (SIDS) precautions.    MOB denied any barriers to visiting with infant after she discharges and she denied having any psychosocial stressors.   CSW made MOB aware that based on infant's gestational weight and weeks; infant is eligible for SSI benefits.  MOB shared an interest in applying. CSW explained application process and encouraged MOB to call CSW if any questions or concerns arise during the application process; MOB agreed.   CSW will continue to offer resources and supports to family while infant remains in NICU.    CSW Plan/Description:  Psychosocial Support and Ongoing Assessment of Needs, Sudden Infant Death Syndrome (SIDS) Education, Perinatal Mood and Anxiety Disorder (PMADs) Education, Other Patient/Family Education, Theatre stage manager Income (SSI) Information, Other Information/Referral to [place], MSW, [employer] Clinical Social Work [phone]   Dimple Nanas, LCSW 06/06/2022, 11:29 AM

## 2022-06-06 NOTE — Progress Notes (Signed)
Patient removed pressure dressing.  Incision CDI.  Honeycomb dressing applied.

## 2022-06-06 NOTE — Plan of Care (Signed)
  Problem: Activity: Goal: Will verbalize the importance of balancing activity with adequate rest periods Outcome: Progressing Goal: Ability to tolerate increased activity will improve Outcome: Progressing   Problem: Self-Concept: Goal: Communication of feelings regarding changes in body function or appearance will improve Outcome: Completed/Met   Problem: Education: Goal: Knowledge of condition will improve Outcome: Completed/Met   Problem: Coping: Goal: Ability to identify and utilize available resources and services will improve Outcome: Completed/Met   Problem: Life Cycle: Goal: Chance of risk for complications during the postpartum period will decrease Outcome: Completed/Met   Problem: Role Relationship: Goal: Ability to demonstrate positive interaction with newborn will improve Outcome: Completed/Met

## 2022-06-07 LAB — RH IG WORKUP (INCLUDES ABO/RH)
Fetal Screen: NEGATIVE
Gestational Age(Wks): 26
Unit division: 0

## 2022-06-07 NOTE — Progress Notes (Signed)
Post Partum Day 2 Subjective: Patient is doing well without complaints. Patient found ambulating in the room. She denies feeling dizzy or lightheaded. Incisional pain is well controlled  Objective: Blood pressure (!) 91/47, pulse 74, temperature 98.4 F (36.9 C), temperature source Oral, resp. rate 16, height 5\' 6"  (1.676 m), weight 112 kg, last menstrual period 12/03/2021, SpO2 98 %, unknown if currently breastfeeding.  Physical Exam:  General: alert, cooperative, and no distress Lochia: appropriate Uterine Fundus: firm Incision: honeycomb dressing clean, dry and intact DVT Evaluation: No evidence of DVT seen on physical exam.  Recent Labs    06/06/22 0447  HGB 7.7*  HCT 23.8*    Assessment/Plan: 21 yo P3 s/p PLTCS at 26w  - Patient is recovering as expected - Encouraged continue ambulation - Patient received venofer yesterday - Patient desires discharge home tomorrow - Continue routine postpartum care   LOS: 10 days   Mora Bellman, MD 06/07/2022, 6:42 AM

## 2022-06-07 NOTE — Lactation Note (Signed)
This note was copied from a baby's chart.  NICU Lactation Consultation Note  Patient Name: Melissa [name] PVXYI'A Date: 06/07/2022 Age:22 hours   Subjective Reason for consult: Follow-up assessment; NICU baby; Preterm <34wks; Infant < 6lbs  Lactation followed up with birth parent. Parent was pleasant but was texting on phone during our consult, briefly looking up to respond. Parent states that her mother assisted her with using her DEBP and that she has pumped. I offered to review the pumping process, and she politely declined and stated that she knew what to do.  Patient states that prior to this birth, she had an experience of engorgement with a previous child and then her milk supply dropped. I recommended pumping q3 hours for 15 minutes consistently, and discussed the prevention/management of engorgement.  Parent has heard from [facility], but her first appointment is on 11/1. I indicated that if she is discharged tomorrow, she will need a pump. I shared our [facility] loaner pump program, and I also recommended we try to move her appointment up to an earlier date. Patient verbalized understanding.  Lactation to follow up tomorrow.  Objective Infant data: Mother's Current Feeding Choice: Breast Milk  Maternal data: [id number]  C-Section, Low Transverse Current breast feeding challenges:: NICU; preterm; separation  Previous breastfeeding challenges?: Other (Comment) (engorgement; milk dried up)  Does the patient have breastfeeding experience prior to this delivery?: Yes How long did the patient breastfeed?: pumped for brief time  Pumping frequency: states they have pumped today; declined help with pumping at this visit. I recommended pumping q3 hours Pumped volume: 0 mL  Risk factor for low milk supply:: parent not pumping consistently at this time   [facility] Program: Yes WIC Referral Sent?: Yes Pump:  ([place] made appointment for November 1; patient does not have a personal  pump)  Assessment Infant: Feeding Status: NPO   Maternal: Milk volume: Normal   Intervention/Plan Interventions: Education  Tools: Pump Pump Education: Setup, frequency, and cleaning  Plan: Consult Status: NICU follow-up  NICU Follow-up type: New admission follow up; Verify absence of engorgement; Verify onset of copious milk    Lenore Manner 06/07/2022, 5:50 PM

## 2022-06-08 ENCOUNTER — Other Ambulatory Visit (HOSPITAL_COMMUNITY): Payer: Self-pay

## 2022-06-08 MED ORDER — IBUPROFEN 600 MG PO TABS
600.0000 mg | ORAL_TABLET | Freq: Four times a day (QID) | ORAL | 0 refills | Status: DC
  Filled 2022-06-08: qty 30, 8d supply, fill #0

## 2022-06-08 MED ORDER — MEDROXYPROGESTERONE ACETATE 150 MG/ML IM SUSP
150.0000 mg | Freq: Once | INTRAMUSCULAR | Status: AC
  Administered 2022-06-08: 150 mg via INTRAMUSCULAR
  Filled 2022-06-08: qty 1

## 2022-06-08 MED ORDER — DOCUSATE SODIUM 100 MG PO CAPS
100.0000 mg | ORAL_CAPSULE | Freq: Two times a day (BID) | ORAL | 0 refills | Status: DC | PRN
  Filled 2022-06-08: qty 40, 20d supply, fill #0

## 2022-06-08 MED ORDER — ACETAMINOPHEN 325 MG PO TABS: 650.0000 mg | ORAL_TABLET | Freq: Four times a day (QID) | ORAL | Status: DC | PRN

## 2022-06-08 MED ORDER — OXYCODONE HCL 5 MG PO TABS
5.0000 mg | ORAL_TABLET | Freq: Four times a day (QID) | ORAL | 0 refills | Status: DC | PRN
  Filled 2022-06-08: qty 20, 5d supply, fill #0

## 2022-06-08 NOTE — Plan of Care (Signed)
PT to be discharged home with printed instruction. Toya Smothers, RN

## 2022-06-09 ENCOUNTER — Ambulatory Visit: Payer: Medicaid Other

## 2022-06-09 ENCOUNTER — Inpatient Hospital Stay (HOSPITAL_COMMUNITY)
Admission: AD | Admit: 2022-06-09 | Discharge: 2022-06-10 | Disposition: A | Discharge status: Home / Self Care | Payer: Medicaid Other | Attending: Obstetrics & Gynecology | Admitting: Obstetrics & Gynecology

## 2022-06-09 DIAGNOSIS — O99335 Smoking (tobacco) complicating the puerperium: Secondary | ICD-10-CM | POA: Insufficient documentation

## 2022-06-09 DIAGNOSIS — A5901 Trichomonal vulvovaginitis: Secondary | ICD-10-CM

## 2022-06-09 DIAGNOSIS — K8020 Calculus of gallbladder without cholecystitis without obstruction: Secondary | ICD-10-CM

## 2022-06-09 DIAGNOSIS — O909 Complication of the puerperium, unspecified: Secondary | ICD-10-CM | POA: Insufficient documentation

## 2022-06-09 DIAGNOSIS — Z98891 History of uterine scar from previous surgery: Secondary | ICD-10-CM

## 2022-06-09 DIAGNOSIS — O0990 Supervision of high risk pregnancy, unspecified, unspecified trimester: Secondary | ICD-10-CM

## 2022-06-09 DIAGNOSIS — R600 Localized edema: Secondary | ICD-10-CM | POA: Insufficient documentation

## 2022-06-09 DIAGNOSIS — R109 Unspecified abdominal pain: Secondary | ICD-10-CM | POA: Insufficient documentation

## 2022-06-09 NOTE — MAU Note (Signed)
Pt says she del by C/S on 06-05-2022 Was here bc SROM and  baby feet out - and has HSV  Baby was 26 weeks - in NICU  Pt was upstairs visiting baby - feet swollen - worse now than before  Feels pain in right breast  Vision - sees spots

## 2022-06-10 ENCOUNTER — Encounter (HOSPITAL_COMMUNITY): Payer: Self-pay | Admitting: Obstetrics & Gynecology

## 2022-06-10 ENCOUNTER — Ambulatory Visit: Payer: Self-pay

## 2022-06-10 ENCOUNTER — Inpatient Hospital Stay (HOSPITAL_COMMUNITY): Payer: Medicaid Other

## 2022-06-10 DIAGNOSIS — O99335 Smoking (tobacco) complicating the puerperium: Secondary | ICD-10-CM | POA: Diagnosis not present

## 2022-06-10 DIAGNOSIS — R600 Localized edema: Secondary | ICD-10-CM | POA: Diagnosis present

## 2022-06-10 DIAGNOSIS — O909 Complication of the puerperium, unspecified: Secondary | ICD-10-CM | POA: Diagnosis not present

## 2022-06-10 DIAGNOSIS — R109 Unspecified abdominal pain: Secondary | ICD-10-CM | POA: Diagnosis not present

## 2022-06-10 DIAGNOSIS — K8020 Calculus of gallbladder without cholecystitis without obstruction: Secondary | ICD-10-CM

## 2022-06-10 LAB — CBC
HCT: 27.1 % — ABNORMAL LOW (ref 36.0–46.0)
Hemoglobin: 8.9 g/dL — ABNORMAL LOW (ref 12.0–15.0)
MCH: 26.2 pg (ref 26.0–34.0)
MCHC: 32.8 g/dL (ref 30.0–36.0)
MCV: 79.7 fL — ABNORMAL LOW (ref 80.0–100.0)
Platelets: 211 10*3/uL (ref 150–400)
RBC: 3.4 MIL/uL — ABNORMAL LOW (ref 3.87–5.11)
RDW: 18.2 % — ABNORMAL HIGH (ref 11.5–15.5)
WBC: 9.7 10*3/uL (ref 4.0–10.5)
nRBC: 1.8 % — ABNORMAL HIGH (ref 0.0–0.2)

## 2022-06-10 LAB — COMPREHENSIVE METABOLIC PANEL
ALT: 26 U/L (ref 0–44)
AST: 24 U/L (ref 15–41)
Albumin: 2.4 g/dL — ABNORMAL LOW (ref 3.5–5.0)
Alkaline Phosphatase: 122 U/L (ref 38–126)
Anion gap: 8 (ref 5–15)
BUN: 7 mg/dL (ref 6–20)
CO2: 24 mmol/L (ref 22–32)
Calcium: 9 mg/dL (ref 8.9–10.3)
Chloride: 106 mmol/L (ref 98–111)
Creatinine, Ser: 0.72 mg/dL (ref 0.44–1.00)
GFR, Estimated: >60 mL/min (ref >60)
Glucose, Bld: 90 mg/dL (ref 70–99)
Potassium: 3.9 mmol/L (ref 3.5–5.1)
Sodium: 138 mmol/L (ref 135–145)
Total Bilirubin: 0.3 mg/dL (ref 0.3–1.2)
Total Protein: 6 g/dL — ABNORMAL LOW (ref 6.5–8.1)

## 2022-06-10 LAB — LIPASE, BLOOD: Lipase: 26 U/L (ref 11–51)

## 2022-06-10 MED ORDER — OXYCODONE HCL 5 MG PO TABS
5.0000 mg | ORAL_TABLET | Freq: Once | ORAL | Status: AC
  Administered 2022-06-10: 5 mg via ORAL
  Filled 2022-06-10: qty 1

## 2022-06-10 MED ORDER — KETOROLAC TROMETHAMINE 10 MG PO TABS: 10.0000 mg | ORAL_TABLET | Freq: Four times a day (QID) | ORAL | 0 refills | Status: AC | PRN

## 2022-06-10 MED ORDER — OXYCODONE-ACETAMINOPHEN 5-325 MG PO TABS: 2.0000 | ORAL_TABLET | ORAL | 0 refills | Status: AC | PRN

## 2022-06-10 MED ORDER — IBUPROFEN 800 MG PO TABS
800.0000 mg | ORAL_TABLET | Freq: Once | ORAL | Status: AC
  Administered 2022-06-10: 800 mg via ORAL
  Filled 2022-06-10: qty 1

## 2022-06-10 NOTE — MAU Provider Note (Addendum)
History     CSN: [id number]  Arrival date and time: 06/09/22 2028   Event Date/Time   First Provider Initiated Contact with Patient 06/10/22 0259      Chief Complaint  Patient presents with   Foot Swelling   Melissa [name] is a 21 y.o. [id number] at 4 Days Postpartum who receives care at [facility].  She presents today for Foot Swelling and RUQ Pain.  Patient reports concern with edema and pain d/t a history of PreE in previous pregnancies. She reports she has been having sharp stabbing pain in her RUQ that started yesterday and has been constant.  She denies improving factors and reports it is worse with breathing.  Patient reports taking ibuprofen and oxycodone at 1700 without relief.  Patient states she ate about 3 hours ago and had a PBJ sandwich, chips, rice krispy treat, and chocolate.  She states she has been having light bleeding and some pain at her c/s scar.  Patient also reports a HA that is in her temporal area (bilaterally) and is sharp and intermittent.  She reports some spotty vision.  She also reports some nausea.  Of note, patient has infant in the NICU and has only been home once, since delivery, to care for her 2 toddlers. Patient does endorse that she is not sleeping much.      OB History     Gravida  4   Para  3   Term  2   Preterm  1   AB  1   Living  3      SAB      IAB      Ectopic      Multiple  0   Live Births  3           Past Medical History:  Diagnosis Date   Anemia    Anxiety    Asthma    inhaler. 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   Constipation    Depression    Episodic tension-type headache, not intractable 04/16/2015   Family history of adverse reaction to anesthesia    mom rushed to hospital from Dentist office, pt her mother & grandmother have trouble waking fully after anesthesia   Genital herpes    HSV infection 08/11/2021   CONCERN for primary outbreak at  [redacted]w[redacted]d>>see 12/20 office note    Low back strain, sequela 11/10/2020   Migraine without aura and without status migrainosus, not intractable 04/16/2015   PID (pelvic inflammatory disease)    Post-partum depression 11/17/2021   Reactive airway disease in pediatric patient    Rh negative state in antepartum period 02/05/2019   Seizures ([place]    last one in 2015 - on meds   Sickle cell trait ([facility]    Status post primary low transverse cesarean section 08/31/2021   [place]    Past Surgical History:  Procedure Laterality Date   APPENDECTOMY     CESAREAN SECTION N/A 08/31/2021   Procedure: CESAREAN SECTION;  Surgeon: WGwynne Edinger MD;  Location: [facility] LD ORS;  Service: Obstetrics;  Laterality: N/A;   CESAREAN SECTION N/A 06/05/2022   Procedure: CESAREAN SECTION;  Surgeon: EChancy Milroy MD;  Location: [facility] LD ORS;  Service: Obstetrics;  Laterality: N/A;   HERNIA REPAIR     INTESTINAL MALROTATION REPAIR  22001-02-26   Family History  Problem Relation Age of Onset  Depression Mother    Stroke Mother    Obesity Mother    Post-traumatic stress disorder Mother    Anxiety disorder Mother    Hypertension Mother    Diabetes Mother    Schizophrenia Father    Kidney disease Father     Social History   Tobacco Use   Smoking status: Every Day    Packs/day: 0.25    Types: Cigarettes   Smokeless tobacco: Never   Tobacco comments:    black and milds, 2 cigs daily  Vaping Use   Vaping Use: Former  Substance Use Topics   Alcohol use: Not Currently    Comment: occasionally, prior to pregnancy   Drug use: Not Currently    Types: Marijuana    Comment: last 2020    Allergies:  Allergies  Allergen Reactions   Benadryl [Diphenhydramine Hcl] Other (See Comments)    Causes seizures    Shellfish Allergy Anaphylaxis    Patient states her throat gets tight   Zithromax [Azithromycin] Anaphylaxis and Swelling   Haldol [Haloperidol] Swelling    Tongue  swelling.   Lactose Intolerance (Gi) Diarrhea   Latex Swelling    Labial edema after usage of latex condoms.     Medications Prior to Admission  Medication Sig Dispense Refill Last Dose   acetaminophen (TYLENOL) 325 MG tablet Take 2 tablets (650 mg total) by mouth every 6 (six) hours as needed.   Past Week   docusate sodium (COLACE) 100 MG capsule Take 1 capsule (100 mg total) by mouth 2 (two) times daily as needed for mild constipation. 40 capsule 0 Past Week   ferrous sulfate 325 (65 FE) MG tablet Take 1 tablet (325 mg total) by mouth at bedtime. 30 tablet 3 06/09/2022   hydrOXYzine (ATARAX) 25 MG tablet TAKE 1 TABLET BY MOUTH 3 TIMES DAILY AS NEEDED FOR ANXIETY. 75 tablet 1 06/09/2022   ibuprofen (ADVIL) 600 MG tablet Take 1 tablet (600 mg total) by mouth every 6 (six) hours. 30 tablet 0 06/09/2022   oxyCODONE (OXY IR/ROXICODONE) 5 MG immediate release tablet Take 1 tablet (5 mg total) by mouth every 6 (six) hours as needed for severe pain. 20 tablet 0 06/09/2022   Prenatal Vit-Fe Fumarate-FA (PREPLUS) 27-1 MG TABS Take 1 tablet by mouth daily. 30 tablet 13 06/09/2022   valACYclovir (VALTREX) 1000 MG tablet Take 1 tablet (1,000 mg total) by mouth 2 (two) times daily. 20 tablet 5 06/09/2022   Blood Pressure Monitoring (BLOOD PRESSURE KIT) DEVI 1 kit by Does not apply route once a week. 1 each 0    Misc. Devices (GOJJI WEIGHT SCALE) MISC 1 Device by Does not apply route every 30 (thirty) days. 1 each 0     Review of Systems  Gastrointestinal:  Positive for abdominal pain (RUQ). Negative for nausea and vomiting.  Genitourinary:  Positive for vaginal bleeding. Negative for difficulty urinating and dysuria.  Neurological:  Negative for dizziness, light-headedness and headaches.   Physical Exam   Blood pressure 112/71, pulse 97, temperature 98.6 F (37 C), temperature source Oral, resp. rate 18, height 5' 7" (1.702 m), weight 106.6 kg, last menstrual period 12/03/2021, unknown if currently  breastfeeding.  Physical Exam Vitals reviewed.  Constitutional:      Appearance: Normal appearance.  HENT:     Head: Normocephalic and atraumatic.  Eyes:     Conjunctiva/sclera: Conjunctivae normal.  Cardiovascular:     Rate and Rhythm: Normal rate.  Pulmonary:     Effort: Pulmonary effort  is normal.  Abdominal:     Tenderness: There is abdominal tenderness in the right upper quadrant and epigastric area.  Musculoskeletal:     Cervical back: Normal range of motion.     Right lower leg: Edema (Nonpitting) present.     Left lower leg: Edema (Nonpitting) present.  Skin:    General: Skin is warm and dry.  Neurological:     Mental Status: She is alert and oriented to person, place, and time.  Psychiatric:        Mood and Affect: Mood normal.        Behavior: Behavior normal.     MAU Course  Procedures Results for orders placed or performed during the hospital encounter of 06/09/22 (from the past 24 hour(s))  CBC     Status: Abnormal   Collection Time: 06/10/22  2:52 AM  Result Value Ref Range   WBC 9.7 4.0 - 10.5 K/uL   RBC 3.40 (L) 3.87 - 5.11 MIL/uL   Hemoglobin 8.9 (L) 12.0 - 15.0 g/dL   HCT 27.1 (L) 36.0 - 46.0 %   MCV 79.7 (L) 80.0 - 100.0 fL   MCH 26.2 26.0 - 34.0 pg   MCHC 32.8 30.0 - 36.0 g/dL   RDW 18.2 (H) 11.5 - 15.5 %   Platelets 211 150 - 400 K/uL   nRBC 1.8 (H) 0.0 - 0.2 %  Comprehensive metabolic panel     Status: Abnormal   Collection Time: 06/10/22  2:52 AM  Result Value Ref Range   Sodium 138 135 - 145 mmol/L   Potassium 3.9 3.5 - 5.1 mmol/L   Chloride 106 98 - 111 mmol/L   CO2 24 22 - 32 mmol/L   Glucose, Bld 90 70 - 99 mg/dL   BUN 7 6 - 20 mg/dL   Creatinine, Ser 0.72 0.44 - 1.00 mg/dL   Calcium 9.0 8.9 - 10.3 mg/dL   Total Protein 6.0 (L) 6.5 - 8.1 g/dL   Albumin 2.4 (L) 3.5 - 5.0 g/dL   AST 24 15 - 41 U/L   ALT 26 0 - 44 U/L   Alkaline Phosphatase 122 38 - 126 U/L   Total Bilirubin 0.3 0.3 - 1.2 mg/dL   GFR, Estimated >60 >60 mL/min    Anion gap 8 5 - 15  Lipase, blood     Status: None   Collection Time: 06/10/22  2:52 AM  Result Value Ref Range   Lipase 26 11 - 51 U/L   US ABDOMEN LIMITED RUQ (LIVER/GB)  Result Date: 06/10/2022 CLINICAL DATA:  Right upper quadrant abdominal pain EXAM: ULTRASOUND ABDOMEN LIMITED RIGHT UPPER QUADRANT COMPARISON:  None Available. FINDINGS: Gallbladder: 1.3 cm nonshadowing mobile structure is likely a gallstone. No wall thickening visualized. Sonographic Murphy's sign was noted by the sonographer. Common bile duct: Diameter: 2.1 mm Liver: No focal lesion identified. Within normal limits in parenchymal echogenicity. Portal vein is patent on color Doppler imaging with normal direction of blood flow towards the liver. Other: None. IMPRESSION: Right upper quadrant ultrasound is equivocal for acute cholecystitis. Cholelithiasis with a positive Murphy's sign was noted. However the gallbladder is nondistended with no wall thickening or pericholecystic fluid. Electronically Signed   By: Placido Sou M.D.   On: 06/10/2022 04:07    MDM Labs: CBC, CMP, Lipase RUQ Ultrasound General Surgery Consult Pain Medication Assessment and Plan  21 year old 4 Days Postpartum Abdominal Pain Pedal Edema  -POC Reviewed. -Labs ordered -Exam performed. -Discussed sending for [place] and  awaiting results. -Patient offered and declines medication at current.   Fraser Din Emly 06/10/2022, 3:02 AM   Reassessment (4:59 AM) -[place] results as above. -General surgery attending, S. Zenia Resides, consulted and informed of patient status, evaluation, interventions, and results. Advised *Will be on unit to see patient. *Okay to give pain medication as appropriate.  -Provider to bedside to update patient on findings and POC. -Patient on phone with mother and visually upset by nurse. -Reassurance given and patient encouraged to rest and await arrival of general surgery for further information.   Reassessment (5:48 AM) -Dr. [name] Rumpf requests call and reports she has seen patient. Expresses concern with pain and requests observation for a couple hours and will reassess later.  -States does not desire to perform surgery now, but will do if necessary.  -Provider to bedside discuss MD recommendation and encourage rest. -Patient agreeable. -Will monitor.   Reassessment (7:58 AM) -Report given to S. Jeronimo Greaves, CNM who assumes care.  Maryann Conners MSN, CNM Advanced Practice Provider, Center for [place] [phone]) --Patient sleeping from 0830 to 0945 --Update provided to Day shift Gen Surg who agrees patient is appropriate for discharge --Will order outpatient Gen Surg evaluation per consult note from Dr. Zenia Resides --New prescription for short court pain management --Discharge home in stable condition  Meds ordered at discharge  Medications   ketorolac (TORADOL) 10 MG tablet    Sig: Take 1 tablet (10 mg total) by mouth every 6 (six) hours as needed for up to 5 days.    Dispense:  20 tablet    Refill:  0    Order Specific Question:   Supervising Provider    Answer:   Donnamae Jude [[id number]]   oxyCODONE-acetaminophen (PERCOCET/ROXICET) 5-325 MG tablet    Sig: Take 2 tablets by mouth every 4 (four) hours as needed for up to 2 days for severe pain.    Dispense:  24 tablet    Refill:  0    Order Specific Question:   Supervising Provider    Answer:   Donnamae Jude [[id number]]    Mallie Snooks, MSA, MSN, CNM Certified Nurse Midwife, [facility] for [employer], [place]

## 2022-06-10 NOTE — Lactation Note (Signed)
This note was copied from a baby's chart.  NICU Lactation Consultation Note  Patient Name: Melissa [name] MDEKI'Y Date: 06/10/2022 Age:22 days  Subjective Reason for consult: Follow-up assessment; NICU baby; Infant < 6lbs; Other (Comment); Preterm <34wks (Readmit)  Visited with family of 10 days old pre-term NICU female, Melissa [name] is a P3 and got readmitted to MAU due to gallbladder stones. She was discharged earlier this morning and was pumping in baby's room (unilateral) with a hand pump. No S/S of engorgement at this time, but she complained of pain when she hasn't pump on a timely manner. Explained the importance of consistent pumping for the prevention of engorgement and to protect her supply, she voiced understanding. Provided a second pump kit to do bilateral pumping at the hospital; she'll take the other pump parts for home use, she's been using a hand pump since her discharge, she politely declined a [facility] loaner due to financial constraints.   Objective Infant data: Mother's Current Feeding Choice: -- (NPO)  Maternal data: [id number]  C-Section, Low Transverse Pumping frequency: 6-7 times/24 hours, but doing unilateral pumping due to lack of DEBP at home Pumped volume: 140 mL Flange Size: 27 WIC Program: Yes WIC Referral Sent?: Yes Pump:  ([place] appt for 06/21/22, patient declined [facility] loaner pump on 06/10/22)  Assessment Infant: Feeding Status: NPO  Maternal: Milk volume: Normal  Intervention/Plan Interventions: Breast feeding basics reviewed; Education; DEBP Tools: Pump; [place] (Provided an extra DEBP kit on 06/10/2022) Pump Education: Setup, frequency, and cleaning; Milk Storage  Plan of care: Encouraged bilateral pumping every 3 hours, ideally 8 pumping sessions/24 hours She will switch her pump settings from initiation to expression mode She'll call for lactation if she decides on doing the [place] before her [facility] appt on 06/21/22  No other  support person at this time. All questions and concerns answered, family to contact [facility] services PRN.  Consult Status: NICU follow-up  NICU Follow-up type: Verify absence of engorgement; Weekly NICU follow up   [place] 06/10/2022, 11:57 AM

## 2022-06-10 NOTE — Consult Note (Signed)
Melissa [name] [birth year]  [id number].    Requesting MD: Gavin Pound, CNM Chief Complaint/Reason for Consult: RUQ pain, gallstones  HPI:  Melissa [name] is a 21 yo female who is 5 days postpartum (delivered at 39w2dvia C section). She presented to the MAU with RUQ abdominal pain. She says she has been having this pain intermittently throughout her pregnancy but it was more mild. It is now very severe. She denies fevers. She is not sure if it worsens with eating. WBC, LFTs and lipase are normal. A RUQ UKoreashowed cholelithiasis with a reported positive sonographic Murphy sign. General surgery was consulted.  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   ROS: Review of Systems  Constitutional:  Negative for chills and fever.  Respiratory:  Negative for shortness of breath.   Gastrointestinal:  Positive for abdominal pain.  Neurological:  Negative for seizures and loss of consciousness.    Family History  Problem Relation Age of Onset   Depression Mother    Stroke Mother    Obesity Mother    Post-traumatic stress disorder Mother    Anxiety disorder Mother    Hypertension Mother    Diabetes Mother    Schizophrenia Father    Kidney disease Father     Past Medical History:  Diagnosis Date   Anemia    Anxiety    Asthma    inhaler. last attack June [id number]  Complication of anesthesia    Congenital hydronephrosis 2001   Constipation    Depression    Episodic tension-type headache, not intractable 04/16/2015   Family history of adverse reaction to anesthesia    mom rushed to hospital from Dentist office, pt her mother & grandmother have trouble waking fully after anesthesia   Genital herpes    HSV infection 08/11/2021   CONCERN for primary outbreak at 348w6d>see 12/20 office note    Low back strain, sequela 11/10/2020   Migraine without  aura and without status migrainosus, not intractable 04/16/2015   PID (pelvic inflammatory disease)    Post-partum depression 11/17/2021   Reactive airway disease in pediatric patient    Rh negative state in antepartum period 02/05/2019   Seizures ([place]   last one in 2015 - on meds   Sickle cell trait ([facility]   Status post primary low transverse cesarean section 08/31/2021   [place]) [place]    Past Surgical History:  Procedure Laterality Date   APPENDECTOMY     CESAREAN SECTION N/A 08/31/2021   Procedure: CESAREAN SECTION;  Surgeon: WoGwynne EdingerMD;  [facility]: [facility];  Service: Obstetrics;  Laterality: N/A;   CESAREAN SECTION N/A 06/05/2022   Procedure: CESAREAN SECTION;  Surgeon: ErChancy MilroyMD;  Location: [facility];  Service: Obstetrics;  Laterality: N/A;   HERNIA REPAIR     INTESTINAL MALROTATION REPAIR  2001    Social History:  reports that she has been smoking cigarettes. She has been smoking an average of .25 packs per day. She has never used smokeless tobacco. She reports that she does not currently use alcohol. She reports that she does not currently use drugs after having used the following drugs: Marijuana.  Allergies:  Allergies  Allergen Reactions   Benadryl [Diphenhydramine Hcl] Other (See Comments)    Causes seizures    Shellfish Allergy Anaphylaxis  Patient states her throat gets tight   Zithromax [Azithromycin] Anaphylaxis and Swelling   Haldol [Haloperidol] Swelling    Tongue swelling.   Lactose Intolerance (Gi) Diarrhea   Latex Swelling    Labial edema after usage of latex condoms.     Medications Prior to Admission  Medication Sig Dispense Refill   acetaminophen (TYLENOL) 325 MG tablet Take 2 tablets (650 mg total) by mouth every 6 (six) hours as needed.     docusate sodium (COLACE) 100 MG capsule Take 1 capsule (100 mg total) by mouth 2 (two) times daily as needed for mild constipation. 40 capsule 0   ferrous  sulfate 325 (65 FE) MG tablet Take 1 tablet (325 mg total) by mouth at bedtime. 30 tablet 3   hydrOXYzine (ATARAX) 25 MG tablet TAKE 1 TABLET BY MOUTH 3 TIMES DAILY AS NEEDED FOR ANXIETY. 75 tablet 1   ibuprofen (ADVIL) 600 MG tablet Take 1 tablet (600 mg total) by mouth every 6 (six) hours. 30 tablet 0   oxyCODONE (OXY IR/ROXICODONE) 5 MG immediate release tablet Take 1 tablet (5 mg total) by mouth every 6 (six) hours as needed for severe pain. 20 tablet 0   Prenatal Vit-Fe Fumarate-FA (PREPLUS) 27-1 MG TABS Take 1 tablet by mouth daily. 30 tablet 13   valACYclovir (VALTREX) 1000 MG tablet Take 1 tablet (1,000 mg total) by mouth 2 (two) times daily. 20 tablet 5   Blood Pressure Monitoring (BLOOD PRESSURE KIT) DEVI 1 kit by Does not apply route once a week. 1 each 0   Misc. Devices (GOJJI WEIGHT SCALE) MISC 1 Device by Does not apply route every 30 (thirty) days. 1 each 0     Physical Exam: Blood pressure 112/71, pulse 97, temperature 98.6 F (37 C), temperature source Oral, resp. rate 18, height '5\' 7"'  (1.702 m), weight 106.6 kg, last menstrual period 12/03/2021, unknown if currently breastfeeding. General: resting in bed, appears uncomfortable Neurological: alert and oriented, no focal deficits, cranial nerves grossly in tact HEENT: normocephalic, atraumatic, oropharynx clear CV: regular rate and rhythm, no murmurs, extremities warm and well-perfused Respiratory: normal work of breathing, symmetric chest wall expansion Abdomen: soft, nondistended, focally tender to palpation in RUQ. Transverse upper abdominal laparotomy scar. No masses or organomegaly. Extremities: warm and well-perfused, no deformities, moving all extremities spontaneously Skin: warm and dry, no jaundice, no rashes or lesions   Results for orders placed or performed during the hospital encounter of 06/09/22 (from the past 48 hour(s))  CBC     Status: Abnormal   Collection Time: 06/10/22  2:52 AM  Result Value Ref Range    WBC 9.7 4.0 - 10.5 K/uL   RBC 3.40 (L) 3.87 - 5.11 MIL/uL   Hemoglobin 8.9 (L) 12.0 - 15.0 g/dL   HCT 27.1 (L) 36.0 - 46.0 %   MCV 79.7 (L) 80.0 - 100.0 fL   MCH 26.2 26.0 - 34.0 pg   MCHC 32.8 30.0 - 36.0 g/dL   RDW 18.2 (H) 11.5 - 15.5 %   Platelets 211 150 - 400 K/uL   nRBC 1.8 (H) 0.0 - 0.2 %    Comment: Performed at [facility], [address] [place], [place] [address]  Comprehensive metabolic panel     Status: Abnormal   Collection Time: 06/10/22  2:52 AM  Result Value Ref Range   Sodium 138 135 - 145 mmol/L   Potassium 3.9 3.5 - 5.1 mmol/L   Chloride 106 98 - 111 mmol/L  CO2 24 22 - 32 mmol/L   Glucose, Bld 90 70 - 99 mg/dL    Comment: Glucose reference range applies only to samples taken after fasting for at least 8 hours.   BUN 7 6 - 20 mg/dL   Creatinine, Ser 0.72 0.44 - 1.00 mg/dL   Calcium 9.0 8.9 - 10.3 mg/dL   Total Protein 6.0 (L) 6.5 - 8.1 g/dL   Albumin 2.4 (L) 3.5 - 5.0 g/dL   AST 24 15 - 41 U/L   ALT 26 0 - 44 U/L   Alkaline Phosphatase 122 38 - 126 U/L   Total Bilirubin 0.3 0.3 - 1.2 mg/dL   GFR, Estimated >60 >60 mL/min    Comment: (NOTE) Calculated using the CKD-EPI Creatinine Equation (2021)    Anion gap 8 5 - 15    Comment: Performed at [facility], [place] [address], [place], [place] [address]  Lipase, blood     Status: None   Collection Time: 06/10/22  2:52 AM  Result Value Ref Range   Lipase 26 11 - 51 U/L    Comment: Performed at [facility], [place] [address], [place], [place] [address]   US ABDOMEN LIMITED RUQ (LIVER/GB)  Result Date: 06/10/2022 CLINICAL DATA:  Right upper quadrant abdominal pain EXAM: ULTRASOUND ABDOMEN LIMITED RIGHT UPPER QUADRANT COMPARISON:  None Available. FINDINGS: Gallbladder: 1.3 cm nonshadowing mobile structure is likely a gallstone. No wall thickening visualized. Sonographic Murphy's sign was noted by the sonographer. Common bile duct: Diameter: 2.1 mm Liver: No focal lesion identified. Within  normal limits in parenchymal echogenicity. Portal vein is patent on color Doppler imaging with normal direction of blood flow towards the liver. Other: None. IMPRESSION: Right upper quadrant ultrasound is equivocal for acute cholecystitis. Cholelithiasis with a positive Murphy's sign was noted. However the gallbladder is nondistended with no wall thickening or pericholecystic fluid. Electronically Signed   By: Placido Sou M.D.   On: 06/10/2022 04:07      Assessment/Plan 21 yo female 5 days postpartum (delivery by C section), presenting with acute RUQ abdominal pain. I reviewed her [place], which shows gallstones but no wall-thickening or pericholecystic fluid to suggest cholecystitis. She does have focal tenderness on exam and reports intermittent symptoms during her pregnancy. Her symptoms are likely related to the gallbladder, but it would be preferable to delay surgery if possible given her recent C section. Recommend treating patient symptomatically with pain and nausea medication. If symptoms resolved, she can follow up as an outpatient to discuss elective cholecystectomy. If pain persists, will consider proceeding with cholecystectomy.   Michaelle Birks, MD [facility] Surgery General, Hepatobiliary and Pancreatic Surgery 06/10/22 5:30 AM

## 2022-06-11 ENCOUNTER — Inpatient Hospital Stay (HOSPITAL_COMMUNITY)
Admission: AD | Admit: 2022-06-11 | Discharge: 2022-06-11 | Disposition: A | Discharge status: Home / Self Care | Payer: Medicaid Other | Attending: Family Medicine | Admitting: Family Medicine

## 2022-06-11 ENCOUNTER — Inpatient Hospital Stay (EMERGENCY_DEPARTMENT_HOSPITAL)
Admission: AD | Admit: 2022-06-11 | Discharge: 2022-06-12 | Disposition: A | Discharge status: Home / Self Care | Payer: Medicaid Other | Source: Home / Self Care | Attending: Obstetrics & Gynecology | Admitting: Obstetrics & Gynecology

## 2022-06-11 ENCOUNTER — Other Ambulatory Visit: Payer: Self-pay

## 2022-06-11 ENCOUNTER — Encounter (HOSPITAL_COMMUNITY): Payer: Self-pay | Admitting: Emergency Medicine

## 2022-06-11 DIAGNOSIS — K8020 Calculus of gallbladder without cholecystitis without obstruction: Secondary | ICD-10-CM

## 2022-06-11 DIAGNOSIS — Z9049 Acquired absence of other specified parts of digestive tract: Secondary | ICD-10-CM | POA: Insufficient documentation

## 2022-06-11 DIAGNOSIS — Z98891 History of uterine scar from previous surgery: Secondary | ICD-10-CM | POA: Insufficient documentation

## 2022-06-11 DIAGNOSIS — F419 Anxiety disorder, unspecified: Secondary | ICD-10-CM | POA: Diagnosis not present

## 2022-06-11 DIAGNOSIS — K8050 Calculus of bile duct without cholangitis or cholecystitis without obstruction: Secondary | ICD-10-CM | POA: Insufficient documentation

## 2022-06-11 DIAGNOSIS — F17210 Nicotine dependence, cigarettes, uncomplicated: Secondary | ICD-10-CM | POA: Diagnosis not present

## 2022-06-11 DIAGNOSIS — N3000 Acute cystitis without hematuria: Secondary | ICD-10-CM

## 2022-06-11 DIAGNOSIS — K8000 Calculus of gallbladder with acute cholecystitis without obstruction: Secondary | ICD-10-CM | POA: Diagnosis present

## 2022-06-11 LAB — CBC WITH DIFFERENTIAL/PLATELET
Abs Immature Granulocytes: 0.59 10*3/uL — ABNORMAL HIGH (ref 0.00–0.07)
Abs Immature Granulocytes: 0.31 10*3/uL — ABNORMAL HIGH (ref 0.00–0.07)
Band Neutrophils: 0 %
Basophils Absolute: 0 10*3/uL (ref 0.0–0.1)
Basophils Absolute: 0 10*3/uL (ref 0.0–0.1)
Basophils Relative: 0 %
Basophils Relative: 0 %
Blasts: 0 %
Eosinophils Absolute: 0.3 10*3/uL (ref 0.0–0.5)
Eosinophils Absolute: 0.2 10*3/uL (ref 0.0–0.5)
Eosinophils Relative: 3 %
Eosinophils Relative: 2 %
HCT: 28.3 % — ABNORMAL LOW (ref 36.0–46.0)
HCT: 28.7 % — ABNORMAL LOW (ref 36.0–46.0)
Hemoglobin: 8.7 g/dL — ABNORMAL LOW (ref 12.0–15.0)
Hemoglobin: 9.3 g/dL — ABNORMAL LOW (ref 12.0–15.0)
Immature Granulocytes: 3 %
Lymphocytes Relative: 27 %
Lymphocytes Relative: 21 %
Lymphs Abs: 2.3 10*3/uL (ref 0.7–4.0)
Lymphs Abs: 2 10*3/uL (ref 0.7–4.0)
MCH: 25.4 pg — ABNORMAL LOW (ref 26.0–34.0)
MCH: 26.2 pg (ref 26.0–34.0)
MCHC: 30.7 g/dL (ref 30.0–36.0)
MCHC: 32.4 g/dL (ref 30.0–36.0)
MCV: 82.7 fL (ref 80.0–100.0)
MCV: 80.8 fL (ref 80.0–100.0)
Metamyelocytes Relative: 4 %
Monocytes Absolute: 0.8 10*3/uL (ref 0.1–1.0)
Monocytes Absolute: 1 10*3/uL (ref 0.1–1.0)
Monocytes Relative: 10 %
Monocytes Relative: 10 %
Myelocytes: 3 %
Neutro Abs: 4.4 10*3/uL (ref 1.7–7.7)
Neutro Abs: 6 10*3/uL (ref 1.7–7.7)
Neutrophils Relative %: 53 %
Neutrophils Relative %: 64 %
Other: 0 %
Platelets: 207 10*3/uL (ref 150–400)
Platelets: 226 10*3/uL (ref 150–400)
Promyelocytes Relative: 0 %
RBC: 3.42 MIL/uL — ABNORMAL LOW (ref 3.87–5.11)
RBC: 3.55 MIL/uL — ABNORMAL LOW (ref 3.87–5.11)
RDW: 18.3 % — ABNORMAL HIGH (ref 11.5–15.5)
RDW: 18.4 % — ABNORMAL HIGH (ref 11.5–15.5)
WBC: 8.4 10*3/uL (ref 4.0–10.5)
WBC: 9.5 10*3/uL (ref 4.0–10.5)
nRBC: 0 /100 WBC
nRBC: 1.8 % — ABNORMAL HIGH (ref 0.0–0.2)
nRBC: 1.7 % — ABNORMAL HIGH (ref 0.0–0.2)

## 2022-06-11 LAB — COMPREHENSIVE METABOLIC PANEL
ALT: 23 U/L (ref 0–44)
ALT: 23 U/L (ref 0–44)
AST: 21 U/L (ref 15–41)
AST: 18 U/L (ref 15–41)
Albumin: 2.5 g/dL — ABNORMAL LOW (ref 3.5–5.0)
Albumin: 2.6 g/dL — ABNORMAL LOW (ref 3.5–5.0)
Alkaline Phosphatase: 106 U/L (ref 38–126)
Alkaline Phosphatase: 122 U/L (ref 38–126)
Anion gap: 8 (ref 5–15)
Anion gap: 8 (ref 5–15)
BUN: 12 mg/dL (ref 6–20)
BUN: 10 mg/dL (ref 6–20)
CO2: 23 mmol/L (ref 22–32)
CO2: 25 mmol/L (ref 22–32)
Calcium: 8.3 mg/dL — ABNORMAL LOW (ref 8.9–10.3)
Calcium: 8.6 mg/dL — ABNORMAL LOW (ref 8.9–10.3)
Chloride: 108 mmol/L (ref 98–111)
Chloride: 104 mmol/L (ref 98–111)
Creatinine, Ser: 0.89 mg/dL (ref 0.44–1.00)
Creatinine, Ser: 0.66 mg/dL (ref 0.44–1.00)
GFR, Estimated: >60 mL/min (ref >60)
GFR, Estimated: >60 mL/min (ref >60)
Glucose, Bld: 90 mg/dL (ref 70–99)
Glucose, Bld: 86 mg/dL (ref 70–99)
Potassium: 3.7 mmol/L (ref 3.5–5.1)
Potassium: 3.6 mmol/L (ref 3.5–5.1)
Sodium: 139 mmol/L (ref 135–145)
Sodium: 137 mmol/L (ref 135–145)
Total Bilirubin: 0.4 mg/dL (ref 0.3–1.2)
Total Bilirubin: 0.3 mg/dL (ref 0.3–1.2)
Total Protein: 6.1 g/dL — ABNORMAL LOW (ref 6.5–8.1)
Total Protein: 6.3 g/dL — ABNORMAL LOW (ref 6.5–8.1)

## 2022-06-11 LAB — URINALYSIS, ROUTINE W REFLEX MICROSCOPIC
Bilirubin Urine: NEGATIVE
Glucose, UA: NEGATIVE mg/dL
Ketones, ur: NEGATIVE mg/dL
Nitrite: NEGATIVE
Protein, ur: NEGATIVE mg/dL
Specific Gravity, Urine: <1.005 — ABNORMAL LOW (ref 1.005–1.030)
pH: 6 (ref 5.0–8.0)

## 2022-06-11 LAB — CBC
HCT: 28.6 % — ABNORMAL LOW (ref 36.0–46.0)
Hemoglobin: 9.3 g/dL — ABNORMAL LOW (ref 12.0–15.0)
MCH: 26.1 pg (ref 26.0–34.0)
MCHC: 32.5 g/dL (ref 30.0–36.0)
MCV: 80.3 fL (ref 80.0–100.0)
Platelets: 212 10*3/uL (ref 150–400)
RBC: 3.56 MIL/uL — ABNORMAL LOW (ref 3.87–5.11)
RDW: 18.3 % — ABNORMAL HIGH (ref 11.5–15.5)
WBC: 8.3 10*3/uL (ref 4.0–10.5)
nRBC: 1.1 % — ABNORMAL HIGH (ref 0.0–0.2)

## 2022-06-11 LAB — LACTIC ACID, PLASMA: Lactic Acid, Venous: 1.2 mmol/L (ref 0.5–1.9)

## 2022-06-11 LAB — URINALYSIS, MICROSCOPIC (REFLEX)

## 2022-06-11 LAB — LIPASE, BLOOD: Lipase: 26 U/L (ref 11–51)

## 2022-06-11 MED ORDER — OXYCODONE HCL 5 MG PO TABS
5.0000 mg | ORAL_TABLET | Freq: Once | ORAL | Status: AC
  Administered 2022-06-11: 5 mg via ORAL
  Filled 2022-06-11: qty 1

## 2022-06-11 MED ORDER — METOCLOPRAMIDE HCL 5 MG/ML IJ SOLN
10.0000 mg | Freq: Once | INTRAMUSCULAR | Status: AC
  Administered 2022-06-11: 10 mg via INTRAVENOUS
  Filled 2022-06-11: qty 2

## 2022-06-11 MED ORDER — ACETAMINOPHEN 500 MG PO TABS
1000.0000 mg | ORAL_TABLET | Freq: Once | ORAL | Status: AC
  Administered 2022-06-11: 1000 mg via ORAL
  Filled 2022-06-11: qty 2

## 2022-06-11 MED ORDER — LACTATED RINGERS IV SOLN: INTRAVENOUS | Status: DC

## 2022-06-11 MED ORDER — HYDROMORPHONE HCL 1 MG/ML IJ SOLN
1.0000 mg | INTRAMUSCULAR | Status: DC | PRN
  Administered 2022-06-11: 1 mg via INTRAVENOUS
  Filled 2022-06-11: qty 1

## 2022-06-11 MED ORDER — LACTATED RINGERS IV BOLUS
1000.0000 mL | Freq: Once | INTRAVENOUS | Status: AC
  Administered 2022-06-11: 1000 mL via INTRAVENOUS

## 2022-06-11 NOTE — MAU Provider Note (Signed)
History     CSN: [id number]  Arrival date and time: 06/11/22 0158   Event Date/Time   First Provider Initiated Contact with Patient 06/11/22 1118      Chief Complaint  Patient presents with   Abdominal Pain    Gallstones   Melissa [name] is a 21 y.o. [id number] POD#6 from [facility] presenting 2/2 RUQ pain with hx of gallstones on [place] 10/21. Continues to have pain despite norco and ibuprofen.   Initial screen in adult ED and transferred care to MAU.   Abdominal Pain This is a new problem. The current episode started yesterday. The onset quality is sudden. The problem occurs constantly. The problem has been gradually worsening since onset. The pain is located in the RUQ. The pain is at a severity of 7/10. The quality of the pain is described as sharp. Associated symptoms include anxiety, frequency and nausea. Pertinent negatives include no constipation, diarrhea, dysuria, fever, headaches or vomiting. Nothing relieves the symptoms. Past treatments include acetaminophen and oral narcotic analgesics. The treatment provided mild relief. (PMH includes: 6 days postpartum)   States her mother has required a gallbladder removal.   OB History     Gravida  4   Para  3   Term  2   Preterm  1   AB  1   Living  3      SAB      IAB      Ectopic      Multiple  0   Live Births  3           Past Medical History:  Diagnosis Date   Anemia    Anxiety    Asthma    inhaler. last attack June 9892   Complication of anesthesia    Congenital hydronephrosis 2001   Constipation    Depression    Episodic tension-type headache, not intractable 04/16/2015   Family history of adverse reaction to anesthesia    mom rushed to hospital from Dentist office, pt her mother & grandmother have trouble waking fully after anesthesia   Genital herpes    HSV infection 08/11/2021   CONCERN for primary outbreak at [redacted]w[redacted]d>>see 12/20 office note    Low back strain, sequela 11/10/2020    Migraine without aura and without status migrainosus, not intractable 04/16/2015   PID (pelvic inflammatory disease)    Post-partum depression 11/17/2021   Reactive airway disease in pediatric patient    Rh negative state in antepartum period 02/05/2019   [place]izures ([place]    last one in 2015 - on meds   Sickle cell trait ([facility]    Status post primary low transverse cesarean section 08/31/2021   [place]traumatic [place] injury) [place]    Past Surgical History:  Procedure Laterality Date   APPENDECTOMY     CESAREAN SECTION N/A 08/31/2021   Procedure: CESAREAN SECTION;  Surgeon: WGwynne Edinger MD;  [facility];  Service: Obstetrics;  Laterality: N/A;   CESAREAN SECTION N/A 06/05/2022   Procedure: CESAREAN SECTION;  Surgeon: EChancy Milroy MD;  Location: [facility];  Service: Obstetrics;  Laterality: N/A;   HERNIA REPAIR     INTESTINAL MALROTATION REPAIR  2001    Family History  Problem Relation Age of Onset   Depression Mother    Stroke Mother    Obesity Mother    Post-traumatic stress disorder Mother    Anxiety disorder Mother    Hypertension Mother    Diabetes Mother  Schizophrenia Father    Kidney disease Father     Social History   Tobacco Use   Smoking status: Every Day    Packs/day: 0.25    Types: Cigarettes   Smokeless tobacco: Never   Tobacco comments:    black and milds, 2 cigs daily  Vaping Use   Vaping Use: Former  Substance Use Topics   Alcohol use: Not Currently    Comment: occasionally, prior to pregnancy   Drug use: Not Currently    Types: Marijuana    Comment: last 2020    Allergies:  Allergies  Allergen Reactions   Benadryl [Diphenhydramine Hcl] Other (See Comments)    Causes seizures    Shellfish Allergy Anaphylaxis    Patient states her throat gets tight   Zithromax [Azithromycin] Anaphylaxis and Swelling   Haldol [Haloperidol] Swelling    Tongue swelling.   Lactose Intolerance (Gi) Diarrhea   Latex Swelling    Labial  edema after usage of latex condoms.     Medications Prior to Admission  Medication Sig Dispense Refill Last Dose   Blood Pressure Monitoring (BLOOD PRESSURE KIT) DEVI 1 kit by Does not apply route once a week. 1 each 0    docusate sodium (COLACE) 100 MG capsule Take 1 capsule (100 mg total) by mouth 2 (two) times daily as needed for mild constipation. 40 capsule 0    ferrous sulfate 325 (65 FE) MG tablet Take 1 tablet (325 mg total) by mouth at bedtime. 30 tablet 3    hydrOXYzine (ATARAX) 25 MG tablet TAKE 1 TABLET BY MOUTH 3 TIMES DAILY AS NEEDED FOR ANXIETY. 75 tablet 1    ketorolac (TORADOL) 10 MG tablet Take 1 tablet (10 mg total) by mouth every 6 (six) hours as needed for up to 5 days. 20 tablet 0    Misc. Devices (GOJJI WEIGHT SCALE) MISC 1 Device by Does not apply route every 30 (thirty) days. 1 each 0    oxyCODONE-acetaminophen (PERCOCET/ROXICET) 5-325 MG tablet Take 2 tablets by mouth every 4 (four) hours as needed for up to 2 days for severe pain. 24 tablet 0    Prenatal Vit-Fe Fumarate-FA (PREPLUS) 27-1 MG TABS Take 1 tablet by mouth daily. 30 tablet 13    valACYclovir (VALTREX) 1000 MG tablet Take 1 tablet (1,000 mg total) by mouth 2 (two) times daily. 20 tablet 5     Review of Systems  Constitutional:  Negative for fever.  Eyes:  Negative for visual disturbance.  Respiratory:  Negative for shortness of breath.   Cardiovascular:  Negative for chest pain.  Gastrointestinal:  Positive for abdominal pain and nausea. Negative for constipation, diarrhea and vomiting.  Genitourinary:  Positive for frequency. Negative for dysuria.  Neurological:  Negative for headaches.  Psychiatric/Behavioral:  The patient is nervous/anxious.    Physical Exam   Blood pressure 110/63, pulse 77, temperature 98.3 F (36.8 C), resp. rate 18, SpO2 100 %, unknown if currently breastfeeding.  Physical Exam Constitutional:      General: She is not in acute distress.    Appearance: She is not  ill-appearing, toxic-appearing or diaphoretic.     Comments: Appears sleepy  Cardiovascular:     Rate and Rhythm: Normal rate and regular rhythm.  Pulmonary:     Effort: Pulmonary effort is normal.     Breath sounds: Normal breath sounds.  Abdominal:     General: Abdomen is flat. Bowel sounds are normal. There is no distension.     Palpations: Abdomen  is soft.     Tenderness: There is abdominal tenderness in the right upper quadrant. There is right CVA tenderness and left CVA tenderness. There is no guarding or rebound. Negative signs include Murphy's sign, McBurney's sign and psoas sign.  Skin:    Findings: No rash.  Neurological:     General: No focal deficit present.     Mental Status: She is alert and oriented to person, place, and time.  Psychiatric:        Mood and Affect: Mood normal.        Behavior: Behavior normal.     MAU Course  Procedures  MDM  VSS.  Reviewed labs and repeated labs with normal white count and normal LFTs.   ULTRASOUND ABDOMEN LIMITED RIGHT UPPER QUADRANT   COMPARISON:  None Available.   FINDINGS: Gallbladder:   1.3 cm nonshadowing mobile structure is likely a gallstone. No wall thickening visualized. Sonographic Murphy's sign was noted by the sonographer.   Common bile duct:   Diameter: 2.1 mm   Liver:   No focal lesion identified. Within normal limits in parenchymal echogenicity. Portal vein is patent on color Doppler imaging with normal direction of blood flow towards the liver.   Other: None.   IMPRESSION: Right upper quadrant ultrasound is equivocal for acute cholecystitis. Cholelithiasis with a positive Murphy's sign was noted. However the gallbladder is nondistended with no wall thickening or pericholecystic fluid.     Electronically Signed   By: Placido Sou M.D.   On: 06/10/2022 04:07  1130- Consult to Dr. Georganna Skeans, [place] surgery who suggested continued pain medication and outpatient follow up.  No acute need for surgery at this time.   UA pending 2/2 unable to void; voided prior to assessment.   Oxy x1 given with improved pain to 5/10 and patient sleeping upon reassessment. PO tolerated.  Assessment and Plan  1. Biliary colic Acute. Appears sleeping but overall well appearing. 2 days RUQ pain. Improvement with pain medication here and tolerating PO. Cholelithiasis without acute cholecystitis on [place] yesterday. Normal white count and LFTs.  -Continue Norco and ibuprofen -Discussed expectation of biliary colic.  - Strict return precautions given. Encouraged to follow up with general surgery in addition to postpartum visits.    Future Appointments  Date Time Provider [place]  06/12/2022  1:20 PM [place] None  07/06/2022  3:50 PM Griffin Basil, MD [place] None     Melissa [name] 06/11/2022, 11:23 AM

## 2022-06-11 NOTE — ED Notes (Signed)
Pt taken to MAU 

## 2022-06-11 NOTE — ED Provider Triage Note (Signed)
Emergency Medicine Provider Triage Evaluation Note  Melissa [name] , a 21 y.o. female  was evaluated in triage.  Pt complains of RUQ pain since yesterday.  Diagnosed with gallstones and evaluated by surgery and felt it would be more beneficial to delay cholecystectomy until her C-section had fully healed (c-section 06/05/22).  Symptoms worsening today so returned to the ED.  Review of Systems  Positive: RUQ pain, known gallstones Negative: fever  Physical Exam  BP 108/68 (BP Location: Right Arm)   Pulse 85   Temp 98.6 F (37 C) (Oral)   Resp 16   SpO2 99%   Gen:   Awake, no distress   Resp:  Normal effort  MSK:   Moves extremities without difficulty  Other:    Medical Decision Making  Medically screening exam initiated at 2:02 AM.  Appropriate orders placed.  Blanch Stang [name] was informed that the remainder of the evaluation will be completed by another provider, this initial triage assessment does not replace that evaluation, and the importance of remaining in the ED until their evaluation is complete.  RUQ pain, known gallstones.  Surgery eval yesterday, cholecystectomy needs to be delayed due to recent C-section on 06/05/2022.  We will recheck labs.    [place] from yesterday: IMPRESSION: Right upper quadrant ultrasound is equivocal for acute cholecystitis. Cholelithiasis with a positive Murphy's sign was noted. However the gallbladder is nondistended with no wall thickening or pericholecystic fluid.     Electronically Signed   By: Placido Sou M.D.   On: 06/10/2022 04:07   Larene Pickett, PA-C 06/11/22 0403

## 2022-06-11 NOTE — MAU Note (Signed)
Transferred from [facility] with RUQ pain. Dx with Gallstones  yesterday.PP 06/05/22. Has been taking ibuprofen and oxycodone with little relief of pain. Pt stated she this she felt something "pop"and pain got worse last night. BP 110/63   Pulse 77   Temp 98.3 F (36.8 C)   Resp 18   SpO2 100%

## 2022-06-11 NOTE — MAU Provider Note (Signed)
History     [id number]  Arrival date and time: 06/11/22 1953    Chief Complaint  Patient presents with   Headache     HPI Melissa [name] is a 21 y.o. at Unknown who presents for ***.   {GYN/OB [id number]}  Past Medical History:  Diagnosis Date   Anemia    Anxiety    Asthma    inhaler. last attack June 1031   Complication of anesthesia    Congenital hydronephrosis 2001   Constipation    Depression    Episodic tension-type headache, not intractable 04/16/2015   Family history of adverse reaction to anesthesia    mom rushed to hospital from Dentist office, pt her mother & grandmother have trouble waking fully after anesthesia   Genital herpes    HSV infection 08/11/2021   CONCERN for primary outbreak at [redacted]w[redacted]d>>see 12/20 office note    Low back strain, sequela 11/10/2020   Migraine without aura and without status migrainosus, not intractable 04/16/2015   PID (pelvic inflammatory disease)    Post-partum depression 11/17/2021   Reactive airway disease in pediatric patient    Rh negative state in antepartum period 02/05/2019   Seizures ([facility]    last one in 2015 - on meds   Sickle cell trait ([facility]    Status post primary low transverse cesarean section 08/31/2021   TBI (traumatic brain injury) ([facility] 2006    Past Surgical History:  Procedure Laterality Date   APPENDECTOMY     CESAREAN SECTION N/A 08/31/2021   Procedure: CESAREAN SECTION;  Surgeon: WGwynne Edinger MD;  [facility]: [facility];  Service: Obstetrics;  Laterality: N/A;   CESAREAN SECTION N/A 06/05/2022   Procedure: CESAREAN SECTION;  Surgeon: EChancy Milroy MD;  Location: [facility];  Service: Obstetrics;  Laterality: N/A;   HERNIA REPAIR     INTESTINAL MALROTATION REPAIR  2001    Family History  Problem Relation Age of Onset   Depression Mother    Stroke Mother    Obesity Mother    Post-traumatic stress disorder Mother    Anxiety disorder Mother    Hypertension Mother    Diabetes  Mother    Schizophrenia Father    Kidney disease Father     Allergies  Allergen Reactions   Benadryl [Diphenhydramine Hcl] Other (See Comments)    Causes seizures    Shellfish Allergy Anaphylaxis    Patient states her throat gets tight   Zithromax [Azithromycin] Anaphylaxis and Swelling   Haldol [Haloperidol] Swelling    Tongue swelling.   Lactose Intolerance (Gi) Diarrhea   Latex Swelling    Labial edema after usage of latex condoms.     No current facility-administered medications on file prior to encounter.   Current Outpatient Medications on File Prior to Encounter  Medication Sig Dispense Refill   Blood Pressure Monitoring (BLOOD PRESSURE KIT) DEVI 1 kit by Does not apply route once a week. 1 each 0   docusate sodium (COLACE) 100 MG capsule Take 1 capsule (100 mg total) by mouth 2 (two) times daily as needed for mild constipation. 40 capsule 0   ferrous sulfate 325 (65 FE) MG tablet Take 1 tablet (325 mg total) by mouth at bedtime. 30 tablet 3   hydrOXYzine (ATARAX) 25 MG tablet TAKE 1 TABLET BY MOUTH 3 TIMES DAILY AS NEEDED FOR ANXIETY. 75 tablet 1   ketorolac (TORADOL) 10 MG tablet Take 1 tablet (10 mg total) by mouth every 6 (six) hours as  needed for up to 5 days. 20 tablet 0   Misc. Devices (GOJJI WEIGHT SCALE) MISC 1 Device by Does not apply route every 30 (thirty) days. 1 each 0   oxyCODONE-acetaminophen (PERCOCET/ROXICET) 5-325 MG tablet Take 2 tablets by mouth every 4 (four) hours as needed for up to 2 days for severe pain. 24 tablet 0   Prenatal Vit-Fe Fumarate-FA (PREPLUS) 27-1 MG TABS Take 1 tablet by mouth daily. 30 tablet 13   valACYclovir (VALTREX) 1000 MG tablet Take 1 tablet (1,000 mg total) by mouth 2 (two) times daily. 20 tablet 5   [DISCONTINUED] rizatriptan (MAXALT-MLT) 10 MG disintegrating tablet Take 1 tablet at onset of migraine with 2 ibuprofen may repeat an additional tablet in 2 hours if needed 10 tablet 5     ROS Pertinent positives and negative  per HPI, all others reviewed and negative  Physical Exam   BP 113/74 (BP Location: Right Arm)   Pulse 90   Temp 100.1 F (37.8 C) (Oral)   Resp 17   Ht '5\' 7"'  (1.702 m)   Wt 104.6 kg   SpO2 99%   BMI 36.12 kg/m   Patient Vitals for the past 24 hrs:  BP Temp Temp src Pulse Resp SpO2 Height Weight  06/11/22 2023 113/74 100.1 F (37.8 C) Oral 90 17 99 % '5\' 7"'  (1.702 m) 104.6 kg    Physical Exam   Cervical Exam    Bedside Ultrasound ***  My interpretation: ***  FHT Baseline ***, *** variability, *** accels, *** decels Toco: *** Cat: ***  Labs Results for orders placed or performed during the hospital encounter of 06/11/22 (from the past 24 hour(s))  CBC with Differential     Status: Abnormal   Collection Time: 06/11/22  2:10 AM  Result Value Ref Range   WBC 8.4 4.0 - 10.5 K/uL   RBC 3.42 (L) 3.87 - 5.11 MIL/uL   Hemoglobin 8.7 (L) 12.0 - 15.0 g/dL   HCT 28.3 (L) 36.0 - 46.0 %   MCV 82.7 80.0 - 100.0 fL   MCH 25.4 (L) 26.0 - 34.0 pg   MCHC 30.7 30.0 - 36.0 g/dL   RDW 18.3 (H) 11.5 - 15.5 %   Platelets 207 150 - 400 K/uL   nRBC 1.8 (H) 0.0 - 0.2 %   Neutrophils Relative % 53 %   Lymphocytes Relative 27 %   Monocytes Relative 10 %   Eosinophils Relative 3 %   Basophils Relative 0 %   Band Neutrophils 0 %   Metamyelocytes Relative 4 %   Myelocytes 3 %   Promyelocytes Relative 0 %   Blasts 0 %   nRBC 0 0 /100 WBC   Other 0 %   Neutro Abs 4.4 1.7 - 7.7 K/uL   Lymphs Abs 2.3 0.7 - 4.0 K/uL   Monocytes Absolute 0.8 0.1 - 1.0 K/uL   Eosinophils Absolute 0.3 0.0 - 0.5 K/uL   Basophils Absolute 0.0 0.0 - 0.1 K/uL   Abs Immature Granulocytes 0.59 (H) 0.00 - 0.07 K/uL  Comprehensive metabolic panel     Status: Abnormal   Collection Time: 06/11/22  2:10 AM  Result Value Ref Range   Sodium 139 135 - 145 mmol/L   Potassium 3.7 3.5 - 5.1 mmol/L   Chloride 108 98 - 111 mmol/L   CO2 23 22 - 32 mmol/L   Glucose, Bld 90 70 - 99 mg/dL   BUN 12 6 - 20 mg/dL    Creatinine, Ser  0.89 0.44 - 1.00 mg/dL   Calcium 8.3 (L) 8.9 - 10.3 mg/dL   Total Protein 6.1 (L) 6.5 - 8.1 g/dL   Albumin 2.5 (L) 3.5 - 5.0 g/dL   AST 21 15 - 41 U/L   ALT 23 0 - 44 U/L   Alkaline Phosphatase 106 38 - 126 U/L   Total Bilirubin 0.4 0.3 - 1.2 mg/dL   GFR, Estimated >60 >60 mL/min   Anion gap 8 5 - 15  Lipase, blood     Status: None   Collection Time: 06/11/22  2:10 AM  Result Value Ref Range   Lipase 26 11 - 51 U/L  CBC     Status: Abnormal   Collection Time: 06/11/22 11:20 AM  Result Value Ref Range   WBC 8.3 4.0 - 10.5 K/uL   RBC 3.56 (L) 3.87 - 5.11 MIL/uL   Hemoglobin 9.3 (L) 12.0 - 15.0 g/dL   HCT 28.6 (L) 36.0 - 46.0 %   MCV 80.3 80.0 - 100.0 fL   MCH 26.1 26.0 - 34.0 pg   MCHC 32.5 30.0 - 36.0 g/dL   RDW 18.3 (H) 11.5 - 15.5 %   Platelets 212 150 - 400 K/uL   nRBC 1.1 (H) 0.0 - 0.2 %  Comprehensive metabolic panel     Status: Abnormal   Collection Time: 06/11/22 11:20 AM  Result Value Ref Range   Sodium 137 135 - 145 mmol/L   Potassium 3.6 3.5 - 5.1 mmol/L   Chloride 104 98 - 111 mmol/L   CO2 25 22 - 32 mmol/L   Glucose, Bld 86 70 - 99 mg/dL   BUN 10 6 - 20 mg/dL   Creatinine, Ser 0.66 0.44 - 1.00 mg/dL   Calcium 8.6 (L) 8.9 - 10.3 mg/dL   Total Protein 6.3 (L) 6.5 - 8.1 g/dL   Albumin 2.6 (L) 3.5 - 5.0 g/dL   AST 18 15 - 41 U/L   ALT 23 0 - 44 U/L   Alkaline Phosphatase 122 38 - 126 U/L   Total Bilirubin 0.3 0.3 - 1.2 mg/dL   GFR, Estimated >60 >60 mL/min   Anion gap 8 5 - 15    Imaging No results found.  MAU Course  Procedures  Lab Orders         Urinalysis, Routine w reflex microscopic Urine, Clean Catch    No orders of the defined types were placed in this encounter.  Imaging Orders  No imaging studies ordered today    MDM *** Assessment and Plan  No diagnosis found.   Jorje Guild, NP 06/11/22 9:52 PM

## 2022-06-11 NOTE — ED Triage Notes (Signed)
Patient arrived with EMS from home reports RUQ abdominal pain with nausea onset yesterday , seen here yesterday diagnosed with gallstones , recent cesarean section surgery 06/05/22. No emesis or fever .

## 2022-06-11 NOTE — MAU Note (Signed)
..  Melissa [name] is a 21 y.o. at Unknown here in MAU reporting: pain under her right breast from her gallbladder that did not go away with the pain medication given earlier. Headache on her right temple that began around 4pm.  Pain score: 5/10 head; 10/10 under right breast Vitals:   06/11/22 2023  BP: 113/74  Pulse: 90  Resp: 17  Temp: 100.1 F (37.8 C)  SpO2: 99%     FHT:not indicated Lab orders placed from triage:  none

## 2022-06-12 ENCOUNTER — Encounter (HOSPITAL_COMMUNITY): Payer: Self-pay | Admitting: Obstetrics & Gynecology

## 2022-06-12 ENCOUNTER — Ambulatory Visit: Payer: Medicaid Other

## 2022-06-12 ENCOUNTER — Ambulatory Visit: Payer: Self-pay

## 2022-06-12 DIAGNOSIS — N3000 Acute cystitis without hematuria: Secondary | ICD-10-CM

## 2022-06-12 DIAGNOSIS — K8020 Calculus of gallbladder without cholecystitis without obstruction: Secondary | ICD-10-CM

## 2022-06-12 LAB — COMPREHENSIVE METABOLIC PANEL
ALT: 19 U/L (ref 0–44)
AST: 17 U/L (ref 15–41)
Albumin: 2.5 g/dL — ABNORMAL LOW (ref 3.5–5.0)
Alkaline Phosphatase: 104 U/L (ref 38–126)
Anion gap: 11 (ref 5–15)
BUN: 9 mg/dL (ref 6–20)
CO2: 22 mmol/L (ref 22–32)
Calcium: 8.8 mg/dL — ABNORMAL LOW (ref 8.9–10.3)
Chloride: 103 mmol/L (ref 98–111)
Creatinine, Ser: 0.68 mg/dL (ref 0.44–1.00)
GFR, Estimated: >60 mL/min (ref >60)
Glucose, Bld: 97 mg/dL (ref 70–99)
Potassium: 3.8 mmol/L (ref 3.5–5.1)
Sodium: 136 mmol/L (ref 135–145)
Total Bilirubin: 0.3 mg/dL (ref 0.3–1.2)
Total Protein: 5.5 g/dL — ABNORMAL LOW (ref 6.5–8.1)

## 2022-06-12 LAB — URINE CULTURE: Culture: NO GROWTH

## 2022-06-12 LAB — LIPASE, BLOOD: Lipase: 23 U/L (ref 11–51)

## 2022-06-12 MED ORDER — AMOXICILLIN-POT CLAVULANATE 875-125 MG PO TABS
1.0000 | ORAL_TABLET | Freq: Once | ORAL | Status: AC
  Administered 2022-06-12: 1 via ORAL
  Filled 2022-06-12: qty 1

## 2022-06-12 MED ORDER — AMOXICILLIN-POT CLAVULANATE 875-125 MG PO TABS: 1.0000 | ORAL_TABLET | Freq: Two times a day (BID) | ORAL | 0 refills | Status: AC

## 2022-06-12 NOTE — Lactation Note (Signed)
This note was copied from a baby's chart.  NICU Lactation Consultation Note  Patient Name: Melissa [name] DGLOV'F Date: 06/12/2022 Age:22 days   Subjective Reason for consult: Follow-up assessment  Lactation followed up with birth parent in infant's room. We discussed pumping. Parent is using a manual pump at home and has a [facility] appt on November 1. I offered to make a call on her behalf to move the appointment up, and she consented.  Parent continues  to pump similar volumes from last lactation appointment.  [place] moved the appointment to Wednesday October 25 at 1300.  Objective  Maternal data: [id number]  C-Section, Low Transverse Current breast feeding challenges:: NICU; lack of personal breast pump  Pumping frequency: q3 hours Pumped volume: 120 mL   WIC Program: Yes WIC Referral Sent?: Yes Pump: Manual (waiting for WIC appt)  Assessment Infant: Feeding Status: NPO   Maternal: Milk volume: Normal   Intervention/Plan Interventions: Breast feeding basics reviewed; Education  Tools: Pump Pump Education: Setup, frequency, and cleaning  Plan: Consult Status: NICU follow-up  NICU Follow-up type: Weekly NICU follow up    Lenore Manner 06/12/2022, 3:24 PM

## 2022-06-15 ENCOUNTER — Ambulatory Visit: Payer: Medicaid Other

## 2022-06-16 ENCOUNTER — Ambulatory Visit: Payer: Medicaid Other

## 2022-06-17 ENCOUNTER — Telehealth (HOSPITAL_COMMUNITY): Payer: Self-pay | Admitting: *Deleted

## 2022-06-17 NOTE — Telephone Encounter (Signed)
Mom reports feeling good. Incision healing well per mom. No concerns regarding herself at this time. EPDS=8 (hospital score=9 with hardly ever answer to #10)) Mom reports baby is well. Feeding, peeing, and pooping without difficulty. Reviewed safe sleep. Mom has no concerns about baby at present.  Odis Hollingshead, RN 06-17-2022 at 2:11pm

## 2022-06-20 ENCOUNTER — Ambulatory Visit: Payer: Medicaid Other

## 2022-06-23 ENCOUNTER — Ambulatory Visit: Payer: Medicaid Other

## 2022-06-25 ENCOUNTER — Ambulatory Visit: Payer: Self-pay

## 2022-06-25 NOTE — Lactation Note (Signed)
This note was copied from a baby's chart.  NICU Lactation Consultation Note  Patient Name: Melissa [name] [id number] Date: 06/25/2022 Age:22 wk.o.  Subjective Reason for consult: Follow-up assessment; NICU baby; Preterm <34wks; Infant < 6lbs  Visited with family of 19 32/33 weeks old (adjusted) NICU female, Melissa [name] is a P3 but reports this is really the first time she's been going this long with breastfeeding. She picked up her pump at the [facility] office last week and she's been pumping at home but not consistently. She goes 8-9 hours without pumping at night. Explained the importance of consistent pumping to protect her supply, noticed it has decrease. Reviewed pumping schedule, lactogenesis III, strategies to increase supply, transitioning back to work and tandem nursing. She has a 22 y.o at home that has been asking to be nursed, explained that she can offer the breast for comfort if that's what she decides. She also inquired about galactagogues, but let her know that her safest bet to increase supply is nipple stimulation through pumping or direct breastfeeding her 22 y.o.  Objective Infant data: Mother's Current Feeding Choice: Breast Milk  Infant feeding assessment Trophic feedings  Maternal data: [id number]  C-Section, Low Transverse Pumping frequency: 5-6 times/24 hours Pumped volume: 75 mL Flange Size: 27 WIC Program: Yes WIC Referral Sent?: Yes Pump: Personal (WIC pump)  Assessment Infant: In NICU  Maternal: Milk volume: Low  Intervention/Plan No data recorded Tools: Pump; [place] Pump Education: Setup, frequency, and cleaning; Milk Storage  Plan of care: Encouraged bilateral pumping every 3 hours, ideally 8 pumping sessions/24 hours She will try not to go more than 6 hours without pumping at night She'll speak with her supervisor at [employer] regarding pumping accommodations, she'll be working 6 hours shifts   No other support person at this time. All questions  and concerns answered, family to contact [facility] services PRN.  Consult Status: NICU follow-up  NICU Follow-up type: Weekly NICU follow up   [place] 06/25/2022, 6:39 PM

## 2022-06-27 ENCOUNTER — Ambulatory Visit: Payer: Medicaid Other

## 2022-06-30 ENCOUNTER — Ambulatory Visit: Payer: Medicaid Other

## 2022-07-06 ENCOUNTER — Ambulatory Visit: Payer: Medicaid Other | Admitting: Obstetrics and Gynecology

## 2022-07-26 ENCOUNTER — Other Ambulatory Visit: Payer: Self-pay

## 2022-07-26 ENCOUNTER — Emergency Department (HOSPITAL_COMMUNITY)
Admission: EM | Admit: 2022-07-26 | Discharge: 2022-07-26 | Discharge status: Against Medical Advice | Payer: Medicaid Other | Attending: Emergency Medicine | Admitting: Emergency Medicine

## 2022-07-26 DIAGNOSIS — R070 Pain in throat: Secondary | ICD-10-CM | POA: Diagnosis not present

## 2022-07-26 DIAGNOSIS — N898 Other specified noninflammatory disorders of vagina: Secondary | ICD-10-CM | POA: Insufficient documentation

## 2022-07-26 DIAGNOSIS — Z202 Contact with and (suspected) exposure to infections with a predominantly sexual mode of transmission: Secondary | ICD-10-CM | POA: Insufficient documentation

## 2022-07-26 DIAGNOSIS — R059 Cough, unspecified: Secondary | ICD-10-CM | POA: Diagnosis not present

## 2022-07-26 DIAGNOSIS — R509 Fever, unspecified: Secondary | ICD-10-CM | POA: Diagnosis not present

## 2022-07-26 DIAGNOSIS — Z5321 Procedure and treatment not carried out due to patient leaving prior to being seen by health care provider: Secondary | ICD-10-CM | POA: Insufficient documentation

## 2022-07-26 DIAGNOSIS — R309 Painful micturition, unspecified: Secondary | ICD-10-CM | POA: Diagnosis not present

## 2022-07-26 DIAGNOSIS — Z1152 Encounter for screening for COVID-19: Secondary | ICD-10-CM | POA: Diagnosis not present

## 2022-07-26 DIAGNOSIS — R1310 Dysphagia, unspecified: Secondary | ICD-10-CM | POA: Diagnosis not present

## 2022-07-26 DIAGNOSIS — R067 Sneezing: Secondary | ICD-10-CM | POA: Insufficient documentation

## 2022-07-26 LAB — URINALYSIS, ROUTINE W REFLEX MICROSCOPIC
Bilirubin Urine: NEGATIVE
Glucose, UA: NEGATIVE mg/dL
Ketones, ur: NEGATIVE mg/dL
Nitrite: NEGATIVE
Protein, ur: NEGATIVE mg/dL
Specific Gravity, Urine: 1.016 (ref 1.005–1.030)
pH: 6 (ref 5.0–8.0)

## 2022-07-26 LAB — RESP PANEL BY RT-PCR (FLU A&B, COVID) ARPGX2
Influenza A by PCR: NEGATIVE
Influenza B by PCR: NEGATIVE
SARS Coronavirus 2 by RT PCR: NEGATIVE

## 2022-07-26 LAB — PREGNANCY, URINE: Preg Test, Ur: NEGATIVE

## 2022-07-26 LAB — GROUP A STREP BY PCR: Group A Strep by PCR: NOT DETECTED

## 2022-07-26 MED ORDER — IBUPROFEN 200 MG PO TABS
400.0000 mg | ORAL_TABLET | Freq: Once | ORAL | Status: AC | PRN
  Administered 2022-07-26: 400 mg via ORAL
  Filled 2022-07-26: qty 2

## 2022-07-26 NOTE — ED Triage Notes (Addendum)
Pt states for about a week she has had a cough, sneezing and fevers. 2 days ago she had onset of throat pain, difficult to swallow. She is also wanting to be checked for STD, has burning with urination, brown/yellow vaginal discharge.

## 2022-10-05 ENCOUNTER — Ambulatory Visit (HOSPITAL_COMMUNITY)
Admission: EM | Admit: 2022-10-05 | Discharge: 2022-10-05 | Disposition: A | Discharge status: Home / Self Care | Payer: Medicaid Other | Attending: Family Medicine | Admitting: Family Medicine

## 2022-10-05 DIAGNOSIS — L299 Pruritus, unspecified: Secondary | ICD-10-CM

## 2022-10-05 DIAGNOSIS — G44209 Tension-type headache, unspecified, not intractable: Secondary | ICD-10-CM | POA: Diagnosis not present

## 2022-10-05 DIAGNOSIS — F411 Generalized anxiety disorder: Secondary | ICD-10-CM | POA: Diagnosis not present

## 2022-10-05 LAB — CBG MONITORING, ED: Glucose-Capillary: 111 mg/dL — ABNORMAL HIGH (ref 70–99)

## 2022-10-05 MED ORDER — HYDROXYZINE HCL 25 MG PO TABS: 25.0000 mg | ORAL_TABLET | Freq: Three times a day (TID) | ORAL | 1 refills | Status: DC | PRN

## 2022-10-05 MED ORDER — CYCLOBENZAPRINE HCL 10 MG PO TABS: ORAL_TABLET | ORAL | 0 refills | Status: DC

## 2022-10-05 NOTE — ED Triage Notes (Signed)
Pt is here for headache on left side of head , skin rash x 1 month. Refill on hydroxyzine

## 2022-10-10 NOTE — ED Provider Notes (Signed)
[place]   [id number] 10/05/22 Arrival Time: [id number]  ASSESSMENT & PLAN:  1. Tension headache   2. Itching   3. GAD (generalized anxiety disorder)    Begin: Meds ordered this encounter  Medications   hydrOXYzine (ATARAX) 25 MG tablet    Sig: Take 1 tablet (25 mg total) by mouth 3 (three) times daily as needed for anxiety. TAKE 1 TABLET BY MOUTH 3 TIMES DAILY AS NEEDED FOR ITCHING.    Dispense:  90 tablet    Refill:  1    DX Code Needed  .   cyclobenzaprine (FLEXERIL) 10 MG tablet    Sig: Take 1 tablet by mouth 3 times daily as needed for muscle spasm. Warning: May cause drowsiness.    Dispense:  21 tablet    Refill:  0   Normal neurological exam. Afebrile without nuchal rigidity. Likely tension headache/spasm. No indication for neurodiagnostic workup at this time. Unknown cause of itching.  Labs Reviewed  CBG MONITORING, ED - Abnormal; Notable for the following components:      Result Value   Glucose-Capillary 111 (*)    All other components within normal limits  Slightly elevated; non-fasting. May recheck here anytime. Declines further lab work to investigate itching.  Recommend:  Follow-up [place].   Why: Keep your follow up appointment next week. Contact information: [place] [address] [phone]                   Reviewed expectations re: course of current medical issues. Questions answered. Outlined signs and symptoms indicating need for more acute intervention. Patient verbalized understanding. After Visit Summary given.   SUBJECTIVE: History from: Patient Patient is able to give a clear and coherent history.  Carsen Emmendorfer Kellam-Wallace is a 22 y.o. female who presents with complaint of a L-sided headache from back of neck up scalp; unsure of exact duration; few days; off/on. Tylenol with some help. Not worst HA of life. . Onset H/O similar headaches. Denies dizziness, loss of  balance, muscle weakness, numbness of extremities, speech difficulties, and vision problems. Denies associated n/v. Denies head injury. Ambulatory without difficulty. No recent travel. Also with long standing problem of itching. Has taken hydroxyzine in the past which helps. Is itching now.  OBJECTIVE:  Vitals:   10/05/22 1831  BP: 109/74  Resp: 16  Temp: 98.3 F (36.8 C)  TempSrc: Oral  SpO2: 98%    General appearance: alert; NAD HENT: normocephalic; atraumatic Eyes: PERRLA; EOMI; conjunctivae normal Neck: supple with FROM Lungs: clear to auscultation bilaterally; unlabored respirations Heart: regular rate and rhythm Extremities: no edema; symmetrical with no gross deformities Skin: warm and dry; no rashes Neurologic: alert; speech is fluent and clear without dysarthria or aphasia; CN 2-12 grossly intact; no facial droop; normal gait; normal symmetric reflexes; normal extremity strength and sensation throughout Psychological: alert and cooperative; normal mood and affect  Labs: Results for orders placed or performed during the hospital encounter of 10/05/22  POC CBG monitoring  Result Value Ref Range   Glucose-Capillary 111 (H) 70 - 99 mg/dL   Labs Reviewed  CBG MONITORING, ED - Abnormal; Notable for the following components:      Result Value   Glucose-Capillary 111 (*)    All other components within normal limits     Allergies  Allergen Reactions   Benadryl [Diphenhydramine Hcl] Other (See Comments)    Causes seizures    Shellfish Allergy Anaphylaxis  Patient states her throat gets tight   Zithromax [Azithromycin] Anaphylaxis and Swelling   Haldol [Haloperidol] Swelling    Tongue swelling.   Lactose Intolerance (Gi) Diarrhea   Latex Swelling    Labial edema after usage of latex condoms.     Past Medical History:  Diagnosis Date   Anemia    Anxiety    Asthma    inhaler. last attack June [id number]   Complication of anesthesia    Congenital hydronephrosis  2001   Constipation    Depression    Episodic tension-type headache, not intractable 04/16/2015   Family history of adverse reaction to anesthesia    mom rushed to hospital from Dentist office, pt her mother & grandmother have trouble waking fully after anesthesia   Genital herpes    Low back strain, sequela 11/10/2020   Migraine without aura and without status migrainosus, not intractable 04/16/2015   PID (pelvic inflammatory disease)    Post-partum depression 11/17/2021   Seizures ([place])    last one in 2015 - on meds   Sickle cell trait ([name])    TBI (traumatic brain injury) ([place]) 2006   Social History   Socioeconomic History   Marital status: Single    Spouse name: Not on file   Number of children: Not on file   Years of education: Not on file   Highest education level: Not on file  Occupational History   Not on file  Tobacco Use   Smoking status: Every Day    Packs/day: 0.25    Types: Cigarettes   Smokeless tobacco: Never   Tobacco comments:    black and milds, 2 cigs daily  Vaping Use   Vaping Use: Former  Substance and Sexual Activity   Alcohol use: Not Currently    Comment: occasionally, prior to pregnancy   Drug use: Not Currently    Types: Marijuana    Comment: last 2020   Sexual activity: Not Currently    Partners: Male    Birth control/protection: None  Other Topics Concern   Not on file  Social History Narrative   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   She attended [employer].    She lives with her parents, siblings, and grandfather.    She enjoys writing, singing, dancing, cooking, and shopping.   She attends [employer].   Social Determinants of Health   Financial Resource Strain: Not on file  Food Insecurity: No Food Insecurity (05/28/2022)   Hunger Vital Sign    Worried About Running Out of Food in the Last Year: Never true    Ran Out of Food in the Last Year: Never true  Transportation Needs: No Transportation Needs (05/28/2022)    PRAPARE - Hydrologist (Medical): No    Lack of Transportation (Non-Medical): No  Physical Activity: Not on file  Stress: Not on file  Social Connections: Not on file  Intimate Partner Violence: Not At Risk (05/28/2022)   Humiliation, Afraid, Rape, and Kick questionnaire    Fear of Current or Ex-Partner: No    Emotionally Abused: No    Physically Abused: No    Sexually Abused: No   Family History  Problem Relation Age of Onset   Depression Mother    Stroke Mother    Obesity Mother    Post-traumatic stress disorder Mother    Anxiety disorder Mother    Hypertension Mother    Diabetes Mother    Schizophrenia Father  Kidney disease Father    Past Surgical History:  Procedure Laterality Date   APPENDECTOMY     CESAREAN SECTION N/A 08/31/2021   Procedure: CESAREAN SECTION;  Surgeon: Gwynne Edinger, MD;  Location: [facility];  Service: Obstetrics;  Laterality: N/A;   CESAREAN SECTION N/A 06/05/2022   Procedure: CESAREAN SECTION;  Surgeon: Chancy Milroy, MD;  Location: [facility];  Service: Obstetrics;  Laterality: N/A;   HERNIA REPAIR     INTESTINAL MALROTATION REPAIR  2001      Vanessa Kick, MD 10/10/22 1058

## 2022-11-02 DIAGNOSIS — R109 Unspecified abdominal pain: Secondary | ICD-10-CM | POA: Diagnosis not present

## 2022-11-02 DIAGNOSIS — R079 Chest pain, unspecified: Secondary | ICD-10-CM | POA: Diagnosis not present

## 2022-11-02 DIAGNOSIS — R0602 Shortness of breath: Secondary | ICD-10-CM | POA: Diagnosis not present

## 2022-11-02 DIAGNOSIS — R300 Dysuria: Secondary | ICD-10-CM | POA: Diagnosis not present

## 2022-11-08 ENCOUNTER — Encounter (HOSPITAL_COMMUNITY): Payer: Self-pay

## 2022-11-08 ENCOUNTER — Emergency Department (HOSPITAL_COMMUNITY)
Admission: EM | Admit: 2022-11-08 | Discharge: 2022-11-08 | Disposition: A | Discharge status: Home / Self Care | Payer: Medicaid Other

## 2022-11-08 ENCOUNTER — Ambulatory Visit (HOSPITAL_COMMUNITY)
Admission: EM | Admit: 2022-11-08 | Discharge: 2022-11-08 | Disposition: A | Discharge status: Home / Self Care | Payer: Medicaid Other | Attending: Emergency Medicine | Admitting: Emergency Medicine

## 2022-11-08 DIAGNOSIS — K0889 Other specified disorders of teeth and supporting structures: Secondary | ICD-10-CM

## 2022-11-08 DIAGNOSIS — R3915 Urgency of urination: Secondary | ICD-10-CM | POA: Diagnosis not present

## 2022-11-08 DIAGNOSIS — R300 Dysuria: Secondary | ICD-10-CM | POA: Diagnosis not present

## 2022-11-08 DIAGNOSIS — R519 Headache, unspecified: Secondary | ICD-10-CM

## 2022-11-08 LAB — POCT URINALYSIS DIPSTICK, ED / UC
Bilirubin Urine: NEGATIVE
Glucose, UA: NEGATIVE mg/dL
Ketones, ur: NEGATIVE mg/dL
Leukocytes,Ua: NEGATIVE
Nitrite: NEGATIVE
Protein, ur: NEGATIVE mg/dL
Specific Gravity, Urine: 1.015 (ref 1.005–1.030)
Urobilinogen, UA: 1 mg/dL (ref 0.0–1.0)
pH: 7 (ref 5.0–8.0)

## 2022-11-08 LAB — CBG MONITORING, ED: Glucose-Capillary: 101 mg/dL — ABNORMAL HIGH (ref 70–99)

## 2022-11-08 MED ORDER — CYCLOBENZAPRINE HCL 10 MG PO TABS: 10.0000 mg | ORAL_TABLET | Freq: Two times a day (BID) | ORAL | 0 refills | Status: DC | PRN

## 2022-11-08 MED ORDER — ACETAMINOPHEN 325 MG PO TABS
975.0000 mg | ORAL_TABLET | Freq: Once | ORAL | Status: AC
  Administered 2022-11-08: 975 mg via ORAL

## 2022-11-08 MED ORDER — ACETAMINOPHEN 325 MG PO TABS
ORAL_TABLET | ORAL | Status: AC
  Filled 2022-11-08: qty 3

## 2022-11-08 MED ORDER — IBUPROFEN 800 MG PO TABS: 800.0000 mg | ORAL_TABLET | Freq: Three times a day (TID) | ORAL | 0 refills | Status: DC | PRN

## 2022-11-08 MED ORDER — AMOXICILLIN-POT CLAVULANATE 875-125 MG PO TABS: 1.0000 | ORAL_TABLET | Freq: Two times a day (BID) | ORAL | 0 refills | Status: DC

## 2022-11-08 MED ORDER — ACETAMINOPHEN 500 MG PO TABS: 500.0000 mg | ORAL_TABLET | Freq: Four times a day (QID) | ORAL | 0 refills | Status: DC | PRN

## 2022-11-08 NOTE — ED Triage Notes (Signed)
Pt is here for headache x 42months and dental pain x a while possible 51yrs

## 2022-11-08 NOTE — ED Notes (Signed)
Patient sat in the chair in the waiting room for less than a minute and was then seen walking out the door.

## 2022-11-08 NOTE — ED Provider Notes (Signed)
[place]    CSN: [id number] Arrival date & time: 11/08/22  1530      History   Chief Complaint Chief Complaint  Patient presents with   Headache   Dental Pain    HPI Melissa [name] is a 22 y.o. female.   Reports headaches and dental pains that have been ongoing for months.  Her headaches have been worsening since her dental pain has been progressing.  She recently went to a dentist during her previous pregnancy (reports her son is 29 months old now and just got out of the NICU at [facility]), and she was told that she would need her 4 wisdom teeth removed.  Today she reports pain at her molars and wisdom teeth location, reports swelling, wisdom teeth are breaking through.  Pain with eating and chewing. Reports subjective fever at home last night.   Earlier today she reports yelling at her kids and making her headache worse.  Headache is occipital, tenderness to musculoskeletal palpation. Does not have tylenol or IBU at home.   Also endorses dysuria and frequent urination, had negative STI screening of gonorrhea and chlamydia at [facility] recently, however, she had left prior to treatment and work-up completion. Denies N/V/D. Reports she was 'pre-diabetic' and diagnosed recently on her ER visit at [facility]. CBG 111 at this visit. Does not have PCP. Patient is on Depo injection, menstrual bleeding started today, needs another injection since 11/05/22.   The history is provided by the patient and medical records.  Headache Associated symptoms: fever and neck pain   Associated symptoms: no abdominal pain, no back pain, no cough, no fatigue and no sore throat   Dental Pain Associated symptoms: fever, headaches and neck pain     Past Medical History:  Diagnosis Date   Anemia    Anxiety    Asthma    inhaler. last attack June [id number]   Complication of anesthesia    Congenital hydronephrosis 2001   Constipation    Depression    Episodic tension-type headache, not intractable  04/16/2015   Family history of adverse reaction to anesthesia    mom rushed to hospital from Dentist office, pt her mother & grandmother have trouble waking fully after anesthesia   Genital herpes    Low back strain, sequela 11/10/2020   Migraine without aura and without status migrainosus, not intractable 04/16/2015   PID (pelvic inflammatory disease)    Post-partum depression 11/17/2021   Seizures ([place])    last one in 2015 - on meds   Sickle cell trait ([name])    TBI (traumatic brain injury) ([place]) 2006    Patient Active Problem List   Diagnosis Date Noted   Delivery by emergency cesarean 0000000   Umbilical cord prolapse 0000000   Vaginal bleeding during pregnancy 05/28/2022   Gonorrhea affecting pregnancy in second trimester 05/18/2022   ?fetal pericardial effusion 05/18/2022   [redacted] weeks gestation of pregnancy    Preterm premature rupture of membranes (PPROM) with unknown onset of labor 05/17/2022   Oligohydramnios antepartum 05/11/2022   Abnormal antenatal AFP screen 04/12/2022   History of cesarean section 04/07/2022   Bartholin cyst 04/07/2022   History of herpes genitalis 04/07/2022   Supervision of high risk pregnancy, antepartum 02/08/2022   Trichomonal vaginitis during pregnancy in second trimester 02/01/2022   History of postpartum depression 11/16/2021   Abnormal involuntary movements 08/04/2020   Migraine with aura and without status migrainosus, not intractable 08/12/2019   Seizures ([place])    Sickle  cell trait ([place]) 02/13/2019   Rh negative state in antepartum period 02/05/2019   Short interval between pregnancies affecting pregnancy in second trimester, antepartum 02/05/2019   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   DMDD (disruptive mood dysregulation disorder) ([place]) 07/21/2016   MDD (major depressive disorder), recurrent severe, 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   Chronic constipation 08/03/2015   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   History of suicide attempt 11/19/2013    Past Surgical History:  Procedure Laterality Date   APPENDECTOMY     CESAREAN SECTION N/A 08/31/2021   Procedure: CESAREAN SECTION;  Surgeon: Gwynne Edinger, MD;  Location: [facility] LD ORS;  Service: Obstetrics;  Laterality: N/A;   CESAREAN SECTION N/A 06/05/2022   Procedure: CESAREAN SECTION;  Surgeon: Chancy Milroy, MD;  Location: [facility] LD ORS;  Service: Obstetrics;  Laterality: N/A;   HERNIA REPAIR     INTESTINAL MALROTATION REPAIR  2001    OB History     Gravida  4   Para  3   Term  2   Preterm  1   AB  1   Living  3      SAB  1   IAB      Ectopic      Multiple  0   Live Births  3            Home Medications    Prior to Admission medications   Medication Sig Start Date End Date Taking? Authorizing Provider  acetaminophen (TYLENOL) 500 MG tablet Take 1 tablet (500 mg total) by mouth every 6 (six) hours as needed. 11/08/22  Yes Louretta Shorten, [place], FNP  amoxicillin-clavulanate (AUGMENTIN) 875-125 MG tablet Take 1 tablet by mouth every 12 (twelve) hours. 11/08/22  Yes Louretta Shorten, [place], FNP  cyclobenzaprine (FLEXERIL) 10 MG tablet Take 1 tablet (10 mg total) by mouth 2 (two) times daily as needed for muscle spasms. 11/08/22  Yes Louretta Shorten, [place], FNP  hydrOXYzine (ATARAX) 25 MG tablet Take 1 tablet (25 mg total) by mouth 3 (three) times daily as needed for anxiety. TAKE 1 TABLET BY MOUTH 3 TIMES DAILY AS NEEDED FOR ITCHING. 10/05/22  Yes Hagler, Aaron Edelman, MD  ibuprofen (ADVIL) 800 MG tablet Take 1 tablet (800 mg total) by mouth every 8 (eight) hours as needed for moderate pain. 11/08/22  Yes Louretta Shorten, [place], FNP  Blood Pressure Monitoring (BLOOD PRESSURE KIT) DEVI 1 kit by Does not apply route once a week. 02/08/22   Chancy Milroy, MD  ferrous sulfate 325 (65 FE) MG tablet Take 1 tablet (325 mg  total) by mouth at bedtime. 04/14/22   [place], [place]. Devices (GOJJI WEIGHT SCALE) MISC 1 Device by Does not apply route every 30 (thirty) days. 02/08/22   Chancy Milroy, MD  valACYclovir (VALTREX) 1000 MG tablet Take 1 tablet (1,000 mg total) by mouth 2 (two) times daily. 05/22/22   Tresea Mall, CNM  rizatriptan (MAXALT-MLT) 10 MG disintegrating tablet Take 1 tablet at onset of migraine with 2 ibuprofen may repeat an additional tablet in 2 hours if needed 05/07/20 06/09/20  Jodi Geralds, MD    Family History Family History  Problem Relation Age of Onset   Depression Mother    Stroke Mother    Obesity Mother    Post-traumatic stress disorder Mother    Anxiety disorder Mother  Hypertension Mother    Diabetes Mother    Schizophrenia Father    Kidney disease Father     Social History Social History   Tobacco Use   Smoking status: Every Day    Packs/day: .25    Types: Cigarettes   Smokeless tobacco: Never   Tobacco comments:    black and milds, 2 cigs daily  Vaping Use   Vaping Use: Former  Substance Use Topics   Alcohol use: Not Currently    Comment: occasionally, prior to pregnancy   Drug use: Not Currently    Types: Marijuana    Comment: last 2020     Allergies   Benadryl [diphenhydramine hcl], Shellfish allergy, Zithromax [azithromycin], Haldol [haloperidol], Lactose intolerance (gi), and Latex   Review of Systems Review of Systems  Constitutional:  Positive for fever. Negative for fatigue.  HENT:  Positive for dental problem. Negative for sore throat.   Respiratory:  Negative for cough and shortness of breath.   Cardiovascular:  Negative for chest pain.  Gastrointestinal:  Negative for abdominal pain.  Genitourinary:  Positive for dysuria and urgency.  Musculoskeletal:  Positive for arthralgias and neck pain. Negative for back pain.  Neurological:  Positive for headaches.     Physical Exam Triage Vital Signs ED Triage  Vitals  Enc Vitals Group     BP 11/08/22 1700 106/70     Pulse Rate 11/08/22 1700 83     Resp 11/08/22 1700 12     Temp 11/08/22 1700 98.1 F (36.7 C)     Temp Source 11/08/22 1700 Oral     SpO2 11/08/22 1700 98 %     Weight --      Height --      Head Circumference --      Peak Flow --      Pain Score 11/08/22 1657 10     Pain Loc --      Pain Edu? --      Excl. in [place]? --    No data found.  Updated Vital Signs BP 106/70 (BP Location: Left Arm)   Pulse 83   Temp 98.1 F (36.7 C) (Oral)   Resp 12   LMP 11/08/2022   SpO2 98%   Visual Acuity Right Eye Distance:   Left Eye Distance:   Bilateral Distance:    Right Eye Near:   Left Eye Near:    Bilateral Near:     Physical Exam Vitals and nursing note reviewed.  Constitutional:      Appearance: She is well-developed.  HENT:     Head: Normocephalic and atraumatic.     Mouth/Throat:     Mouth: Mucous membranes are moist.     Comments: Lower wisdom teeth protruding through gums. No obvious abscess or purulent discharge. Gingival tenderness. Uvula midline.  Eyes:     General: No scleral icterus.    Extraocular Movements: Extraocular movements intact.     Pupils: Pupils are equal, round, and reactive to light. Pupils are equal.  Cardiovascular:     Rate and Rhythm: Normal rate and regular rhythm.     Heart sounds: Normal heart sounds. No murmur heard. Pulmonary:     Effort: Pulmonary effort is normal. No respiratory distress.     Breath sounds: Normal breath sounds.  Musculoskeletal:        General: No swelling. Normal range of motion.     Cervical back: Normal range of motion and neck supple. No rigidity.  Lymphadenopathy:  Cervical: No cervical adenopathy.  Skin:    General: Skin is warm and dry.  Neurological:     Mental Status: She is alert and oriented to person, place, and time.     GCS: GCS eye subscore is 4. GCS verbal subscore is 5. GCS motor subscore is 6.      UC Treatments / Results   Labs (all labs ordered are listed, but only abnormal results are displayed) Labs Reviewed  CBG MONITORING, ED - Abnormal; Notable for the following components:      Result Value   Glucose-Capillary 101 (*)    All other components within normal limits  POCT URINALYSIS DIPSTICK, ED / UC - Abnormal; Notable for the following components:   Hgb urine dipstick LARGE (*)    All other components within normal limits  URINE CULTURE    EKG   Radiology No results found.  Procedures Procedures (including critical care time)  Medications Ordered in UC Medications  acetaminophen (TYLENOL) tablet 975 mg (975 mg Oral Given 11/08/22 1734)    Initial Impression / Assessment and Plan / UC Course  I have reviewed the triage vital signs and the nursing notes.  Pertinent labs & imaging results that were available during my care of the patient were reviewed by me and considered in my medical decision making (see chart for details).  Vitals in triage reviewed, patient is hemodynamically stable.  Afebrile, without tachycardia.  Reports needing 4 wisdom teeth removed, wisdom teeth protruding and visible on exam with associated gingival pain and tenderness.  Uvula midline, low concern for peritonsillar abscess.  Without purulent drainage or obvious abscess.  Will cover with Augmentin for potential oral infection.  Suspect headache partially related to chronic headaches and underlying dental issues, left-sided musculoskeletal neck tenderness and pain with range of motion. CN 2-12 intact, negative for vision changes, BP stable. Will cover with Flexeril, advised to alternate between Tylenol and ibuprofen.  Return precautions, follow-up care and emergency precautions given, patient verbalized understanding, no questions at this time.     Final Clinical Impressions(s) / UC Diagnoses   Final diagnoses:  Pain, dental  Bad headache     Discharge Instructions      Your urine was negative for infection.   Your blood sugar was 101, this is normal for not fasting. Your wisdom teeth are causing your dental pain, please follow-up with a dentist as soon as possible.  I am covering you with Augmentin for potential infection.  For your headache, the Tylenol and ibuprofen combination should help, you can alternate every 4-6 hours between the 2.  Please take the Flexeril as needed for the neck pain and stiffness, do not drink or drive on this medication as it may make you drowsy.  Please return to clinic or seek immediate if symptoms persist beyond antibiotics, you develop fever, oral swelling, or inability to swallow.       ED Prescriptions     Medication Sig Dispense Auth. Provider   amoxicillin-clavulanate (AUGMENTIN) 875-125 MG tablet Take 1 tablet by mouth every 12 (twelve) hours. 14 tablet Louretta Shorten, [place], [place]   cyclobenzaprine (FLEXERIL) 10 MG tablet Take 1 tablet (10 mg total) by mouth 2 (two) times daily as needed for muscle spasms. 20 tablet Louretta Shorten, [place], [place]   acetaminophen (TYLENOL) 500 MG tablet Take 1 tablet (500 mg total) by mouth every 6 (six) hours as needed. 30 tablet Louretta Shorten, [place], [place]   ibuprofen (ADVIL) 800 MG tablet Take 1 tablet (  800 mg total) by mouth every 8 (eight) hours as needed for moderate pain. 21 tablet Carollee Nussbaumer, [place], [place]      PDMP not reviewed this encounter.   Kru Allman, [place], [place] 11/08/22 [id number]

## 2022-11-08 NOTE — Discharge Instructions (Addendum)
Your urine was negative for infection.  Your blood sugar was 101, this is normal for not fasting. Your wisdom teeth are causing your dental pain, please follow-up with a dentist as soon as possible.  I am covering you with Augmentin for potential infection.  For your headache, the Tylenol and ibuprofen combination should help, you can alternate every 4-6 hours between the 2.  Please take the Flexeril as needed for the neck pain and stiffness, do not drink or drive on this medication as it may make you drowsy.  Please return to clinic or seek immediate if symptoms persist beyond antibiotics, you develop fever, oral swelling, or inability to swallow.

## 2022-11-14 ENCOUNTER — Ambulatory Visit (HOSPITAL_COMMUNITY)
Admission: EM | Admit: 2022-11-14 | Discharge: 2022-11-14 | Disposition: A | Discharge status: Other Acute Inpatient Hospital | Payer: Medicaid Other | Attending: Family Medicine | Admitting: Family Medicine

## 2022-11-14 ENCOUNTER — Inpatient Hospital Stay (HOSPITAL_COMMUNITY)
Admission: AD | Admit: 2022-11-14 | Discharge: 2022-11-23 | DRG: 885 | Disposition: A | Discharge status: Home / Self Care | Payer: Medicaid Other | Source: Intra-hospital | Attending: Psychiatry | Admitting: Psychiatry

## 2022-11-14 ENCOUNTER — Encounter (HOSPITAL_COMMUNITY): Payer: Self-pay | Admitting: Family Medicine

## 2022-11-14 DIAGNOSIS — Z5982 Transportation insecurity: Secondary | ICD-10-CM | POA: Diagnosis not present

## 2022-11-14 DIAGNOSIS — F333 Major depressive disorder, recurrent, severe with psychotic symptoms: Secondary | ICD-10-CM

## 2022-11-14 DIAGNOSIS — D573 Sickle-cell trait: Secondary | ICD-10-CM | POA: Diagnosis present

## 2022-11-14 DIAGNOSIS — Z5941 Food insecurity: Secondary | ICD-10-CM | POA: Diagnosis not present

## 2022-11-14 DIAGNOSIS — Z818 Family history of other mental and behavioral disorders: Secondary | ICD-10-CM

## 2022-11-14 DIAGNOSIS — K5900 Constipation, unspecified: Secondary | ICD-10-CM | POA: Diagnosis present

## 2022-11-14 DIAGNOSIS — Z87820 Personal history of traumatic brain injury: Secondary | ICD-10-CM

## 2022-11-14 DIAGNOSIS — Z560 Unemployment, unspecified: Secondary | ICD-10-CM | POA: Diagnosis not present

## 2022-11-14 DIAGNOSIS — Z1152 Encounter for screening for COVID-19: Secondary | ICD-10-CM | POA: Insufficient documentation

## 2022-11-14 DIAGNOSIS — R079 Chest pain, unspecified: Secondary | ICD-10-CM | POA: Diagnosis not present

## 2022-11-14 DIAGNOSIS — Z79899 Other long term (current) drug therapy: Secondary | ICD-10-CM

## 2022-11-14 DIAGNOSIS — R072 Precordial pain: Secondary | ICD-10-CM | POA: Diagnosis present

## 2022-11-14 DIAGNOSIS — F411 Generalized anxiety disorder: Secondary | ICD-10-CM | POA: Diagnosis present

## 2022-11-14 DIAGNOSIS — F17210 Nicotine dependence, cigarettes, uncomplicated: Secondary | ICD-10-CM | POA: Diagnosis present

## 2022-11-14 DIAGNOSIS — F79 Unspecified intellectual disabilities: Secondary | ICD-10-CM | POA: Diagnosis present

## 2022-11-14 DIAGNOSIS — Z9151 Personal history of suicidal behavior: Secondary | ICD-10-CM | POA: Diagnosis not present

## 2022-11-14 DIAGNOSIS — R45851 Suicidal ideations: Secondary | ICD-10-CM | POA: Diagnosis present

## 2022-11-14 DIAGNOSIS — R0789 Other chest pain: Secondary | ICD-10-CM | POA: Diagnosis not present

## 2022-11-14 DIAGNOSIS — Z20822 Contact with and (suspected) exposure to covid-19: Secondary | ICD-10-CM | POA: Diagnosis present

## 2022-11-14 DIAGNOSIS — B009 Herpesviral infection, unspecified: Secondary | ICD-10-CM | POA: Diagnosis present

## 2022-11-14 DIAGNOSIS — Z726 Gambling and betting: Secondary | ICD-10-CM | POA: Insufficient documentation

## 2022-11-14 DIAGNOSIS — Z823 Family history of stroke: Secondary | ICD-10-CM | POA: Diagnosis not present

## 2022-11-14 DIAGNOSIS — J45909 Unspecified asthma, uncomplicated: Secondary | ICD-10-CM | POA: Diagnosis present

## 2022-11-14 DIAGNOSIS — I517 Cardiomegaly: Secondary | ICD-10-CM | POA: Insufficient documentation

## 2022-11-14 DIAGNOSIS — F429 Obsessive-compulsive disorder, unspecified: Secondary | ICD-10-CM | POA: Diagnosis present

## 2022-11-14 DIAGNOSIS — Z8659 Personal history of other mental and behavioral disorders: Secondary | ICD-10-CM

## 2022-11-14 DIAGNOSIS — F29 Unspecified psychosis not due to a substance or known physiological condition: Secondary | ICD-10-CM | POA: Diagnosis not present

## 2022-11-14 DIAGNOSIS — Z8759 Personal history of other complications of pregnancy, childbirth and the puerperium: Secondary | ICD-10-CM

## 2022-11-14 LAB — CBC WITH DIFFERENTIAL/PLATELET
Abs Immature Granulocytes: 0.01 10*3/uL (ref 0.00–0.07)
Basophils Absolute: 0 10*3/uL (ref 0.0–0.1)
Basophils Relative: 1 %
Eosinophils Absolute: 0.2 10*3/uL (ref 0.0–0.5)
Eosinophils Relative: 2 %
HCT: 38.3 % (ref 36.0–46.0)
Hemoglobin: 13 g/dL (ref 12.0–15.0)
Immature Granulocytes: 0 %
Lymphocytes Relative: 35 %
Lymphs Abs: 3 10*3/uL (ref 0.7–4.0)
MCH: 26.2 pg (ref 26.0–34.0)
MCHC: 33.9 g/dL (ref 30.0–36.0)
MCV: 77.1 fL — ABNORMAL LOW (ref 80.0–100.0)
Monocytes Absolute: 0.7 10*3/uL (ref 0.1–1.0)
Monocytes Relative: 8 %
Neutro Abs: 4.7 10*3/uL (ref 1.7–7.7)
Neutrophils Relative %: 54 %
Platelets: 255 10*3/uL (ref 150–400)
RBC: 4.97 MIL/uL (ref 3.87–5.11)
RDW: 15.5 % (ref 11.5–15.5)
WBC: 8.5 10*3/uL (ref 4.0–10.5)
nRBC: 0 % (ref 0.0–0.2)

## 2022-11-14 LAB — BASIC METABOLIC PANEL
Anion gap: 12 (ref 5–15)
BUN: 10 mg/dL (ref 6–20)
CO2: 24 mmol/L (ref 22–32)
Calcium: 9.4 mg/dL (ref 8.9–10.3)
Chloride: 104 mmol/L (ref 98–111)
Creatinine, Ser: 0.75 mg/dL (ref 0.44–1.00)
GFR, Estimated: >60 mL/min (ref >60)
Glucose, Bld: 76 mg/dL (ref 70–99)
Potassium: 3.6 mmol/L (ref 3.5–5.1)
Sodium: 140 mmol/L (ref 135–145)

## 2022-11-14 LAB — URINALYSIS, ROUTINE W REFLEX MICROSCOPIC
Bilirubin Urine: NEGATIVE
Glucose, UA: NEGATIVE mg/dL
Ketones, ur: NEGATIVE mg/dL
Nitrite: NEGATIVE
Protein, ur: 30 mg/dL — AB
RBC / HPF: >50 RBC/hpf (ref 0–5)
Specific Gravity, Urine: 1.017 (ref 1.005–1.030)
pH: 7 (ref 5.0–8.0)

## 2022-11-14 LAB — POCT URINE DRUG SCREEN - MANUAL ENTRY (I-SCREEN)
POC Amphetamine UR: NOT DETECTED
POC Buprenorphine (BUP): NOT DETECTED
POC Cocaine UR: NOT DETECTED
POC Marijuana UR: NOT DETECTED
POC Methadone UR: NOT DETECTED
POC Methamphetamine UR: NOT DETECTED
POC Morphine: NOT DETECTED
POC Oxazepam (BZO): NOT DETECTED
POC Oxycodone UR: NOT DETECTED
POC Secobarbital (BAR): NOT DETECTED

## 2022-11-14 LAB — LIPID PANEL
Cholesterol: 153 mg/dL (ref 0–200)
HDL: 37 mg/dL — ABNORMAL LOW (ref >40)
LDL Cholesterol: 101 mg/dL — ABNORMAL HIGH (ref 0–99)
Total CHOL/HDL Ratio: 4.1 RATIO
Triglycerides: 75 mg/dL (ref <150)
VLDL: 15 mg/dL (ref 0–40)

## 2022-11-14 LAB — POC URINE PREG, ED: Preg Test, Ur: NEGATIVE

## 2022-11-14 LAB — SARS CORONAVIRUS 2 BY RT PCR: SARS Coronavirus 2 by RT PCR: NEGATIVE

## 2022-11-14 LAB — TSH: TSH: 1.093 u[IU]/mL (ref 0.350–4.500)

## 2022-11-14 LAB — ETHANOL: Alcohol, Ethyl (B): <10 mg/dL (ref <10)

## 2022-11-14 LAB — POC SARS CORONAVIRUS 2 AG: SARSCOV2ONAVIRUS 2 AG: NEGATIVE

## 2022-11-14 MED ORDER — ACETAMINOPHEN 325 MG PO TABS
650.0000 mg | ORAL_TABLET | Freq: Four times a day (QID) | ORAL | Status: DC | PRN
  Administered 2022-11-16 – 2022-11-22 (×4): 650 mg via ORAL
  Filled 2022-11-14 (×4): qty 2

## 2022-11-14 MED ORDER — MAGNESIUM HYDROXIDE 400 MG/5ML PO SUSP: 30.0000 mL | Freq: Every day | ORAL | Status: DC | PRN

## 2022-11-14 MED ORDER — ALUM & MAG HYDROXIDE-SIMETH 200-200-20 MG/5ML PO SUSP: 30.0000 mL | ORAL | Status: DC | PRN

## 2022-11-14 MED ORDER — ALBUTEROL SULFATE HFA 108 (90 BASE) MCG/ACT IN AERS: 2.0000 | INHALATION_SPRAY | Freq: Four times a day (QID) | RESPIRATORY_TRACT | Status: DC | PRN

## 2022-11-14 MED ORDER — ARIPIPRAZOLE 5 MG PO TABS
5.0000 mg | ORAL_TABLET | Freq: Once | ORAL | Status: AC
  Administered 2022-11-14: 5 mg via ORAL
  Filled 2022-11-14: qty 1

## 2022-11-14 MED ORDER — HYDROXYZINE HCL 25 MG PO TABS
25.0000 mg | ORAL_TABLET | Freq: Three times a day (TID) | ORAL | Status: DC | PRN
  Administered 2022-11-14: 25 mg via ORAL
  Filled 2022-11-14: qty 1

## 2022-11-14 MED ORDER — ZIPRASIDONE MESYLATE 20 MG IM SOLR: 20.0000 mg | INTRAMUSCULAR | Status: DC | PRN

## 2022-11-14 MED ORDER — VENLAFAXINE HCL ER 75 MG PO CP24
75.0000 mg | ORAL_CAPSULE | Freq: Every day | ORAL | Status: DC
  Administered 2022-11-15 – 2022-11-17 (×3): 75 mg via ORAL
  Filled 2022-11-14 (×5): qty 1

## 2022-11-14 MED ORDER — ARIPIPRAZOLE 5 MG PO TABS
5.0000 mg | ORAL_TABLET | Freq: Every day | ORAL | Status: DC
  Administered 2022-11-15: 5 mg via ORAL
  Filled 2022-11-14 (×3): qty 1

## 2022-11-14 MED ORDER — OLANZAPINE 10 MG PO TBDP
10.0000 mg | ORAL_TABLET | Freq: Three times a day (TID) | ORAL | Status: DC | PRN
  Filled 2022-11-14: qty 1

## 2022-11-14 MED ORDER — HYDROXYZINE HCL 25 MG PO TABS
25.0000 mg | ORAL_TABLET | Freq: Three times a day (TID) | ORAL | Status: DC | PRN
  Administered 2022-11-15 – 2022-11-22 (×14): 25 mg via ORAL
  Filled 2022-11-14 (×15): qty 1
  Filled 2022-11-14: qty 10

## 2022-11-14 MED ORDER — NICOTINE 14 MG/24HR TD PT24
14.0000 mg | MEDICATED_PATCH | Freq: Every day | TRANSDERMAL | Status: DC
  Filled 2022-11-14 (×11): qty 1

## 2022-11-14 MED ORDER — VENLAFAXINE HCL ER 75 MG PO CP24
75.0000 mg | ORAL_CAPSULE | Freq: Once | ORAL | Status: AC
  Administered 2022-11-14: 75 mg via ORAL
  Filled 2022-11-14: qty 1

## 2022-11-14 MED ORDER — MAGNESIUM HYDROXIDE 400 MG/5ML PO SUSP
30.0000 mL | Freq: Every day | ORAL | Status: DC | PRN
  Administered 2022-11-17 – 2022-11-21 (×2): 30 mL via ORAL
  Filled 2022-11-14 (×2): qty 30

## 2022-11-14 MED ORDER — OLANZAPINE 10 MG PO TBDP: 10.0000 mg | ORAL_TABLET | Freq: Three times a day (TID) | ORAL | Status: DC | PRN

## 2022-11-14 MED ORDER — TRAZODONE HCL 50 MG PO TABS: 50.0000 mg | ORAL_TABLET | Freq: Every evening | ORAL | Status: DC | PRN

## 2022-11-14 MED ORDER — ACETAMINOPHEN 325 MG PO TABS: 650.0000 mg | ORAL_TABLET | Freq: Four times a day (QID) | ORAL | Status: DC | PRN

## 2022-11-14 MED ORDER — LORAZEPAM 1 MG PO TABS: 1.0000 mg | ORAL_TABLET | ORAL | Status: DC | PRN

## 2022-11-14 MED ORDER — ONDANSETRON 4 MG PO TBDP
4.0000 mg | ORAL_TABLET | Freq: Once | ORAL | Status: AC
  Administered 2022-11-14: 4 mg via ORAL
  Filled 2022-11-14: qty 1

## 2022-11-14 NOTE — Progress Notes (Signed)
Pt was accepted to [facility] 11/14/2022, pending negative covid and signed voluntary consent faxed to [phone]. Bed assignment: 307-1  Pt meets inpatient criteria per Molli Barrows, NP  Attending Physician will be Janine Limbo, MD  Report can be called to: - Adult unit: [phone]  Pt can arrive after 2000  Care Team Notified: [facility] Lynnda Shields, RN, Sibyl Parr, RN, Lavell Anchors, NP, and Dayna Ramus, RN

## 2022-11-14 NOTE — Care Management (Signed)
OBS Care Management   The NP requested to have a CPS submitted to social services based on the collateral information that the maternal grandmother gave to the NP.   Writer reported the following collateral information to the Melissa [name] with CPS   Collateral Information:  Melissa [name], mother, [phone], reports that she and her husband Melissa [name] are primary care takers of all three of the patients children. According to mother, patients youngest infant recently was discharged from the NICU after being born at 62 weeks in October. Per mom Melissa [name], patient has made statements that she has had thoughts of suffocating the newborn while at the at the hospital. When any of the children are crying or overactive, Melissa [name] according to mother, cries uncontrollable and verbalizes increased frustration which causes mother to fear the patient being left alone with the children. Mother reports patient is seldom left alone with the children due to emotional lability over the years has raised by mother that patient is too unstable to parent. Regarding gambling addiction, patient oldest child has disabilities and receives a monthly check and per mother, Melissa [name] uses more than 1/2 of the benefits to gamble at the "fishbowls and sweepstakes" establishments. Reports patient becomes verbally aggressive if she is unable to go and gamble. Mother and father do not have custody of the children however mother endorses this is likely the next step since they are children's primary care giver. She wants her daughter to get help. Patient has been off psychiatric medications for over one year according me mom

## 2022-11-14 NOTE — ED Notes (Signed)
Pt is currently sleeping, no distress noted, environmental check complete, will continue to monitor patient for safety. ? ?

## 2022-11-14 NOTE — ED Provider Notes (Addendum)
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 10.5 K/uL Final   RBC 05/28/2022 4.02  3.87 - 5.11 MIL/uL Final   Hemoglobin 05/28/2022 10.4 (L)  12.0 - 15.0 g/dL Final   HCT 05/28/2022 32.1 (L)  36.0 - 46.0 % Final   MCV 05/28/2022 79.9 (L)  80.0 - 100.0 fL Final   MCH 05/28/2022 25.9 (L)  26.0 - 34.0 pg Final   MCHC 05/28/2022 32.4  30.0 - 36.0 g/dL Final   RDW 05/28/2022 15.7 (H)  11.5 - 15.5 % Final   Platelets 05/28/2022 231  150 - 400 K/uL Final   nRBC 05/28/2022 0.0  0.0 - 0.2 % Final   Performed at [facility], [place] [address], [place],  [place] [address]   WBC 06/04/2022 9.3  4.0 - 10.5 K/uL Final   RBC 06/04/2022 3.78 (L)  3.87 - 5.11 MIL/uL Final   Hemoglobin 06/04/2022 9.7 (L)  12.0 -  15.0 g/dL Final   HCT 06/04/2022 29.0 (L)  36.0 - 46.0 % Final   MCV 06/04/2022 76.7 (L)  80.0 - 100.0 fL Final   MCH 06/04/2022 25.7 (L)  26.0 - 34.0 pg Final   MCHC 06/04/2022 33.4  30.0 - 36.0 g/dL Final   RDW 06/04/2022 15.7 (H)  11.5 - 15.5 % Final   Platelets 06/04/2022 169  150 - 400 K/uL Final   nRBC 06/04/2022 0.3 (H)  0.0 - 0.2 % Final   Performed at [facility], [place] [address], [place], [place] [address]   SARS Coronavirus 2 by RT PCR 06/03/2022 NEGATIVE  NEGATIVE Final   Comment: (NOTE) SARS-CoV-2 target nucleic acids are NOT DETECTED.  The SARS-CoV-2 RNA is generally detectable in upper respiratory specimens during the acute phase of infection. The lowest concentration of SARS-CoV-2 viral copies this assay can detect is 138 copies/mL. A negative result does not preclude SARS-Cov-2 infection and should not be used as the sole basis for treatment or other patient management decisions. A negative result may occur with  improper specimen collection/handling, submission of specimen other than nasopharyngeal swab, presence of viral mutation(s) within the areas targeted by this assay, and inadequate number of viral copies(<138 copies/mL). A negative result must be combined with clinical observations, patient history, and epidemiological information. The expected result is Negative.  Fact Sheet for Patients:  EntrepreneurPulse.com.au  Fact Sheet for Healthcare Providers:  IncredibleEmployment.be  This test is no                          t yet approved or cleared by the [place] FDA and  has been authorized for detection and/or diagnosis of SARS-CoV-2 by FDA under an Emergency Use Authorization (EUA). This EUA will remain  in effect (meaning this test can be used) for the duration of  the COVID-19 declaration under Section 564(b)(1) of the Act, 21 U.S.C.section 360bbb-3(b)(1), unless the authorization is terminated  or revoked sooner.       Influenza A by PCR 06/03/2022 NEGATIVE  NEGATIVE Final   Influenza B by PCR 06/03/2022 NEGATIVE  NEGATIVE Final   Comment: (NOTE) The Xpert Xpress SARS-CoV-2/FLU/RSV plus assay is intended as an aid in the diagnosis of influenza from Nasopharyngeal swab specimens and should not be used as a sole basis for treatment. Nasal washings and aspirates are unacceptable for Xpert Xpress SARS-CoV-2/FLU/RSV testing.  Fact Sheet for Patients: EntrepreneurPulse.com.au  Fact Sheet for Healthcare Providers: IncredibleEmployment.be  This test is not yet approved or cleared by the [place] FDA and has been authorized for detection and/or diagnosis of SARS-CoV-2 by FDA under an Emergency Use Authorization (EUA). This EUA will remain in effect (meaning this test can be used) for the duration of the COVID-19 declaration under Section 564(b)(1) of the Act, 21 U.S.C. section 360bbb-3(b)(1), unless the authorization is terminated or revoked.  Performed at [facility], [place] [address] [place], [place] [address]    SURGICAL PATHOLOGY 06/05/2022    Final-Edited                   Value:SURGICAL PATHOLOGY CASE: [id number] PATIENT: [facility] Surgical Pathology Report     Clinical History: 26w 2d; repeat stat cesarean for prolapsed cord, viable female infant, 3 vessel cord intact (crm)     FINAL MICROSCOPIC DIAGNOSIS:  A. PLACENTA, SINGLETON, DELIVERY: - Third trimester placenta (272 g) with acute chorioamnionitis - Three vessel umbilical cord  GROSS DESCRIPTION:  Specimen received: Fresh Size and shape: A discoid singleton placenta measuring 16.5 x 12.7 x 2.6 cm Weight: [id number] g Umbilical cord: The trivascular umbilical cord is tan-yellow, rubbery, 47.1 cm in  length, ranges from 0.8 to 1.1 cm in diameter, and inserts 3.5 cm to the closest placental margin.  Approximately 21.0 cm of cord displays minimal twisting and moderate flattening. Membranes: Membranes are partially torn from placental disc, and completeness cannot be determined.  Membranes appear to display marginal insertion, are tan-pink, smooth, dull, and tra                         nslucent Fetal surface: Tan-pink, smooth, with a normal vascular pattern Maternal surface: Maternal surface is moderately disrupted and completeness cannot be determined with poorly defined red-brown cotyledons, minimal calcifications, and a moderate amount of adherent red-brown clotted blood.  The disrupted parenchyma extends to the underlying fetal surface. Cut surface: Spongy tan-red, and no lesions are grossly identified. Block summary: 4 blocks submitted 1 = umbilical cord and membranes 2-4 = placenta  Craig Staggers 06/05/2022)    Final Diagnosis performed by Jaquita Folds, MD.   Electronically signed 06/06/2022 Technical and / or Professional components performed at [facility], [place] [address], [place], [place] [address].  Immunohistochemistry Technical component (if applicable) was performed at [facility] [address] [place], [place], [place] [address].   IMMUNOHISTOCHEMISTRY DISCLAIMER (if applicable): Some of these im                         munohistochemical stains may have been developed and the performance characteristics determine by [facility]. Some may not have been cleared or approved by the U.S. Food and Drug Administration. The FDA has determined that such clearance or approval is not necessary. This test is used for clinical purposes. It should not be regarded as investigational or for research. This laboratory is certified under the [place] (CLIA-88) as qualified to perform high  complexity clinical laboratory testing.  The controls stained appropriately.    ABO/RH(D) 06/05/2022 O NEG   Final   Antibody Screen 06/05/2022 POS   Final   Sample Expiration 06/05/2022 06/08/2022,2359   Final   Antibody Identification [id number]    Final                   Value:PASSIVELY ACQUIRED ANTI-D Performed at [facility], [place] [address], [place], [place] [address]    WBC 06/06/2022 19.2 (H)  4.0 - 10.5 K/uL Final   RBC 06/06/2022 3.03 (L)  3.87 - 5.11 MIL/uL Final   Hemoglobin 06/06/2022 7.7 (L)  12.0 - 15.0 g/dL Final   HCT 06/06/2022 23.8 (L)  36.0 - 46.0 % Final   MCV 06/06/2022 78.5 (L)  80.0 - 100.0 fL Final   MCH 06/06/2022 25.4 (L)  26.0 - 34.0 pg Final   MCHC 06/06/2022 32.4  30.0 - 36.0 g/dL Final   RDW 06/06/2022 16.7 (H)  11.5 - 15.5 % Final   Platelets 06/06/2022 163  150 - 400 K/uL Final   nRBC 06/06/2022 0.3 (H)  0.0 - 0.2 % Final   Performed at [facility], [place] [address] [place],  [address]   Gestational Age(Wks) 06/06/2022 26   Final   Fetal Screen 06/06/2022 NEG   Final   Unit Number 06/06/2022 [id number]   Final  Blood Component Type 06/06/2022 RHIG   Final   Unit division 06/06/2022 00   Final   Status of Unit 06/06/2022 ISSUED,FINAL   Final   Transfusion Status 06/06/2022    Final                   Value:OK TO TRANSFUSE Performed at [facility], [place] [address], [place], [place] [address]   Admission on 05/22/2022, Discharged on 05/22/2022  Component Date Value Ref Range Status   POCT [place] Test 05/22/2022 Negative = intact amniotic membranes   Final  Admission on 05/17/2022, Discharged on 05/22/2022  Component Date Value Ref Range Status   Color, Urine 05/17/2022 STRAW (A)  YELLOW Final   APPearance 05/17/2022 CLEAR  CLEAR Final   Specific Gravity, Urine 05/17/2022 1.004 (L)  1.005 - 1.030 Final   pH 05/17/2022 7.0  5.0 - 8.0 Final   Glucose, UA 05/17/2022 NEGATIVE  NEGATIVE mg/dL Final   Hgb urine dipstick  05/17/2022 LARGE (A)  NEGATIVE Final   Bilirubin Urine 05/17/2022 NEGATIVE  NEGATIVE Final   Ketones, ur 05/17/2022 NEGATIVE  NEGATIVE mg/dL Final   Protein, ur 05/17/2022 NEGATIVE  NEGATIVE mg/dL Final   Nitrite 05/17/2022 NEGATIVE  NEGATIVE Final   Leukocytes,Ua 05/17/2022 SMALL (A)  NEGATIVE Final   RBC / HPF 05/17/2022 6-10  0 - 5 RBC/hpf Final   WBC, UA 05/17/2022 11-20  0 - 5 WBC/hpf Final   Bacteria, UA 05/17/2022 FEW (A)  NONE SEEN Final   Squamous Epithelial / HPF 05/17/2022 0-5  0 - 5 Final   Mucus 05/17/2022 PRESENT   Final   Performed at [facility], [place] [address], [place], [place] [address]   Amnisure ROM 05/17/2022 POSITIVE   Final   Performed at [facility], [place] [address] [place], [place] [address]   Yeast Wet Prep HPF POC 05/17/2022 NONE SEEN  NONE SEEN Final   Trich, Wet Prep 05/17/2022 PRESENT (A)  NONE SEEN Final   Clue Cells Wet Prep HPF POC 05/17/2022 PRESENT (A)  NONE SEEN Final   WBC, Wet Prep HPF POC 05/17/2022 >=10 (A)  <10 Final   Sperm 05/17/2022 NONE SEEN   Final   Performed at [facility], [place] [address], [place], [place] [address]   Neisseria Gonorrhea 05/17/2022 Positive (A)   Final   Chlamydia 05/17/2022 Negative   Final   Comment 05/17/2022 Normal Reference [place] Chlamydia - Negative   Final   Comment 05/17/2022 Normal Reference Range Neisseria Gonorrhea - Negative   Final   ABO/RH(D) 05/17/2022 O NEG   Final   Antibody Screen 05/17/2022 NEG   Final   Sample Expiration 05/17/2022    Final                   Value:05/20/2022,2359 Performed at [facility], [place] [address] [place], [place] [address]    Sodium 05/17/2022 136  135 - 145 mmol/L Final   Potassium 05/17/2022 3.7  3.5 - 5.1 mmol/L Final   Chloride 05/17/2022 108  98 - 111 mmol/L Final   CO2 05/17/2022 22  22 - 32 mmol/L Final   Glucose, Bld 05/17/2022 86  70 - 99 mg/dL Final   Glucose reference range applies only to samples taken after fasting for at least 8  hours.   BUN 05/17/2022 7  6 - 20 mg/dL Final   Creatinine, Ser 05/17/2022 0.62  0.44 - 1.00 mg/dL Final  Calcium 05/17/2022 8.6 (L)  8.9 - 10.3 mg/dL Final   Total Protein 05/17/2022 6.5  6.5 - 8.1 g/dL Final   Albumin 05/17/2022 2.7 (L)  3.5 - 5.0 g/dL Final   AST 05/17/2022 17  15 - 41 U/L Final   ALT 05/17/2022 10  0 - 44 U/L Final   Alkaline Phosphatase 05/17/2022 76  38 - 126 U/L Final   Total Bilirubin 05/17/2022 0.1 (L)  0.3 - 1.2 mg/dL Final   GFR, Estimated 05/17/2022 >60  >60 mL/min Final   Comment: (NOTE) Calculated using the CKD-EPI Creatinine Equation (2021)    Anion gap 05/17/2022 6  5 - 15 Final   Performed at [facility], [place] [address] [place], [place] [address]   WBC 05/18/2022 9.8  4.0 - 10.5 K/uL Final   RBC 05/18/2022 4.17  3.87 - 5.11 MIL/uL Final   Hemoglobin 05/18/2022 10.8 (L)  12.0 - 15.0 g/dL Final   HCT 05/18/2022 32.4 (L)  36.0 - 46.0 % Final   MCV 05/18/2022 77.7 (L)  80.0 - 100.0 fL Final   MCH 05/18/2022 25.9 (L)  26.0 - 34.0 pg Final   MCHC 05/18/2022 33.3  30.0 - 36.0 g/dL Final   RDW 05/18/2022 15.4  11.5 - 15.5 % Final   Platelets 05/18/2022 212  150 - 400 K/uL Final   nRBC 05/18/2022 0.0  0.0 - 0.2 % Final   Performed at [facility], [place] [address], [place], [place] [address]   Unit Number 05/18/2022 [id number]   Final   Blood Component Type 05/18/2022 RHIG   Final   Unit division 05/18/2022 00   Final   Status of Unit 05/18/2022 [facility]   Final   Transfusion Status 05/18/2022    Final                   Value:OK TO TRANSFUSE Performed at [facility], [place] [address] [place], [place] [address]    Gestational Age(Wks) 05/18/2022    Final                   Value:23 Performed at [facility], [place] [address] [place], [place] [address]    RPR Ser Ql 05/18/2022 NON REACTIVE  NON REACTIVE Final   Performed at [facility], [place] [address] [place],  [address]   HIV-1 P24 Antigen - HIV24 05/18/2022  NON REACTIVE  NON REACTIVE Final   Comment: (NOTE) Detection of p24 may be inhibited by biotin in the sample, causing false negative results in acute infection.    HIV 1/2 Antibodies 05/18/2022 NON REACTIVE  NON REACTIVE Final   Interpretation (HIV Ag Ab) 05/18/2022 A non reactive test result means that HIV 1 or HIV 2 antibodies and HIV 1 p24 antigen were not detected in the specimen.   Final   Performed at [facility], [place] [address], [place],  [address]   Rubella 05/18/2022 8.85  Immune >0.99 index Final   Comment: (NOTE)                                Non-immune       <0.90                                Equivocal  0.90 - 0.99  Immune           >0.99 Performed At: [facility] [place], [place] [id number] Rush Farmer MD [id number]    Toxoplasma IgG Ratio 05/18/2022 <3.0  0.0 - 7.1 IU/mL Final   Comment: (NOTE)                                 Negative        <7.2                                 Equivocal  7.2 - 8.7                                 Positive        >8.7 Performed At: [facility] [place], [place] [id number] Rush Farmer MD [id number]    CMV Ab - IgG 05/18/2022 >10.00 (H)  0.00 - 0.59 U/mL Final   Comment: (NOTE)                               Negative          <0.60                               Equivocal   0.60 - 0.69                               Positive          >0.69 Performed At: [place], [place] [id number] Rush Farmer MD [id number]    HSV 1 Glycoprotein G Ab, IgG 05/18/2022 <0.91  0.00 - 0.90 index Final   Comment: (NOTE)                                 Negative        <0.91                                 Equivocal 0.91 - 1.09                                 Positive        >1.09 Note: Negative indicates no antibodies detected to HSV-1. Equivocal may suggest early infection.  If clinically appropriate, retest at  later date. Positive indicates antibodies detected to HSV-1. Performed At: [facility] [place], [place] [id number] Rush Farmer MD [id number]    HSV 2 Glycoprotein G Ab, IgG 05/18/2022 13.10 (H)  0.00 - 0.90 index Final   Comment: (NOTE)                                 Negative        <0.91  Equivocal 0.91 - 1.09                                 Positive        >1.09 HSV-2 Antibody Interpretation: Negative indicates no detectable antibodies to HSV-2 were found. If recent exposure is suspected, retest in 4-6 weeks. Equivocal samples should be retested in 4-6 weeks. Positive indicates the presence of detectable IgG antibody to HSV-2. False positive results may occur. Repeat testing, or testing by a different method, may be indicated in some settings (e.g. patients with low likelihood of HSV infection). If clinically appropriate, retest 4-6 weeks later. Performed At: [facility] [place], [place] [id number] Rush Farmer MD [id number]    Parovirus B19 IgG Abs 05/18/2022 4.3 (H)  0.0 - 0.8 index Final   Comment: (NOTE)                                 Negative        <0.9                                 Equivocal  0.9 - 1.1                                 Positive        >1.1    Parovirus B19 IgM Abs 05/18/2022 0.2  0.0 - 0.8 index Final   Comment: (NOTE)                                 Negative        <0.9                                 Equivocal  0.9 - 1.1                                 Positive        >1.1 Performed At: [facility] [address] [place], [place] [id number] Rush Farmer MD [id number]    Amnisure ROM 05/21/2022 NEGATIVE   Final   Performed at [facility], [place] [address] [place],  [address]    Allergies: Benadryl [diphenhydramine hcl], Gluten meal, Shellfish allergy, Zithromax [azithromycin], Haldol [haloperidol], Lactose intolerance (gi), and  Latex  Medications:  Facility Ordered Medications  Medication   acetaminophen (TYLENOL) tablet 650 mg   alum & mag hydroxide-simeth (MAALOX/MYLANTA) 200-200-20 MG/5ML suspension 30 mL   magnesium hydroxide (MILK OF MAGNESIA) suspension 30 mL   traZODone (DESYREL) tablet 50 mg   PTA Medications  Medication Sig   Blood Pressure Monitoring (BLOOD PRESSURE KIT) DEVI 1 kit by Does not apply route once a week.   Misc. Devices (GOJJI WEIGHT SCALE) MISC 1 Device by Does not apply route every 30 (thirty) days.   ferrous sulfate 325 (65 FE) MG tablet Take 1 tablet (325 mg total) by mouth at bedtime.   valACYclovir (VALTREX) 1000 MG tablet Take 1 tablet (1,000 mg total) by mouth 2 (two) times daily.  hydrOXYzine (ATARAX) 25 MG tablet Take 1 tablet (25 mg total) by mouth 3 (three) times daily as needed for anxiety. TAKE 1 TABLET BY MOUTH 3 TIMES DAILY AS NEEDED FOR ITCHING.   amoxicillin-clavulanate (AUGMENTIN) 875-125 MG tablet Take 1 tablet by mouth every 12 (twelve) hours.   cyclobenzaprine (FLEXERIL) 10 MG tablet Take 1 tablet (10 mg total) by mouth 2 (two) times daily as needed for muscle spasms.   acetaminophen (TYLENOL) 500 MG tablet Take 1 tablet (500 mg total) by mouth every 6 (six) hours as needed.   ibuprofen (ADVIL) 800 MG tablet Take 1 tablet (800 mg total) by mouth every 8 (eight) hours as needed for moderate pain.    Medical Decision Making  Patient case review and discussed with Dr. Dwyane Dee, patient meets inpatient criteria for inpatient psychiatric treatment. Patient is unable to contract for safety at this time. Patient is currently being reviewed [facility] bed [facility] at [facility].  If no bed availability, LCSW will fax patient out. ECG NSR QTC 433 Restarting home medications with dose adjustments: Abilify 5 mg daily (increased from 2 mg). Effexor 75 mg daily daily for depression. Hydroxyzine 25 mg TID, PRN for anxiety. Recommendations  Based on my evaluation the patient does not appear  to have an emergency medical condition.  Molli Barrows, FNP-C, PMHNP-BC  [facility] Urgent [phone]  11/14/22  8:30 AM

## 2022-11-14 NOTE — ED Notes (Signed)
Report called to Opal Sidles [email or web address]  adult unit

## 2022-11-14 NOTE — ED Notes (Signed)
Patient A&Ox4. Patient reports SI with no plan or intent. Patient denies HI and AVH. Patient reports increase depression and financial problems.  Patient denies any physical complaints when asked. No acute distress noted. Support and encouragement provided. Routine safety checks conducted according to facility protocol. Patient oriented to unit and breakfast giving. Encouraged patient to notify staff if thoughts of harm toward self or others arise. Patient verbalize understanding and agreement. Will continue to monitor for safety.

## 2022-11-14 NOTE — ED Notes (Signed)
[facility] called for pick up of PCR covid swab.

## 2022-11-14 NOTE — ED Notes (Signed)
Pt A&O x 4, sleeping at present, no distress noted.  Monitoring for safety.  Pending [facility] at 8pm.

## 2022-11-14 NOTE — Progress Notes (Signed)
   11/14/22 [id number]  [place] (Walk-ins at [facility] only)  How Did You Hear About [place]? Self  What Is the Reason for Your Visit/Call Today? Cortnei Kellam-Wallace is a 22 year old female presenting as a voluntary walk-in to [facility] due to [facility] with no plan. Patient denied HI and psychosis. Patient reports onset of SI was 2 months ago. Patient reports stressors includes her gambling addiction, being a single parent to 3 children (newborn, 65 year old and a 17 year old). Patient reported that her mother is currently watching her kids and helps her with them. Patient also reports stressors of trying to find a job. Patient reported attempted overdose on seizure medicine in 2015.  How Long Has This Been Causing You Problems? 1-6 months  Have You Recently Had Any Thoughts About Hurting Yourself? Yes  How long ago did you have thoughts about hurting yourself? prior to arrival  Are You Planning to [place] At This time? No  Have you Recently Had Thoughts About [place]? No  Are You Planning To Harm Someone At This Time? No  Are you currently experiencing any auditory, visual or other hallucinations? No  Have You Used Any Alcohol or Drugs in the Past 24 Hours? No  Do you have any current medical co-morbidities that require immediate attention? No  Clinician description of patient physical appearance/behavior: neat / cooperative  What Do You Feel Would Help You the Most Today? Treatment for Depression or other mood problem  If access to [facility] was not available, would you have sought care in the Emergency Department? Yes  Determination of Need Routine (7 days)  Options For Referral Medication Management;Outpatient Therapy;[facility] Urgent Care    Flowsheet Row ED from 11/14/2022 in [facility] ED from 11/08/2022 in [facility] Urgent Care at [facility] ED from 10/05/2022 in [facility] at [place] Moderate Risk No Risk No Risk
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
## 2022-11-15 ENCOUNTER — Encounter (HOSPITAL_COMMUNITY): Payer: Self-pay | Admitting: Family Medicine

## 2022-11-15 ENCOUNTER — Encounter (HOSPITAL_COMMUNITY): Payer: Self-pay

## 2022-11-15 ENCOUNTER — Other Ambulatory Visit: Payer: Self-pay

## 2022-11-15 LAB — PROLACTIN: Prolactin: 14.4 ng/mL (ref 4.8–33.4)

## 2022-11-15 LAB — HEMOGLOBIN A1C
Hgb A1c MFr Bld: 5.3 % (ref 4.8–5.6)
Mean Plasma Glucose: 105 mg/dL

## 2022-11-15 LAB — GLUCOSE, CAPILLARY: Glucose-Capillary: 138 mg/dL — ABNORMAL HIGH (ref 70–99)

## 2022-11-15 MED ORDER — ONDANSETRON HCL 4 MG PO TABS
4.0000 mg | ORAL_TABLET | Freq: Three times a day (TID) | ORAL | Status: DC | PRN
  Administered 2022-11-15 – 2022-11-21 (×3): 4 mg via ORAL
  Filled 2022-11-15 (×3): qty 1

## 2022-11-15 MED ORDER — MEDROXYPROGESTERONE ACETATE 150 MG/ML IM SUSP
150.0000 mg | Freq: Once | INTRAMUSCULAR | Status: AC
  Administered 2022-11-16: 150 mg via INTRAMUSCULAR
  Filled 2022-11-15: qty 1

## 2022-11-15 NOTE — Progress Notes (Addendum)
  On assessment, patient presents with with complaints of nausea, sweating, light headedness. Patient request blood sugar be taken stating she feels this way when it is abnormal. blood sugar 138, most recent vitals sitting is 118/79, 93, temp 98.9 F, 100% O2. Patient reports that she feels like the walls in the room are closing in on her. Patient questions if the Effexor could be causing her to feel this way.   Provider notified.  Per provider, no new orders, continue to monitor patient.

## 2022-11-15 NOTE — Group Note (Signed)
Recreation Therapy Group Note   Group Topic:Team Building  Group Date: 11/15/2022 Start Time: 0930 End Time: 1000 Facilitators: Analea Muller-McCall, LRT,CTRS Location: [address] Dayroom   Goal Area(s) Addresses:  Patient will effectively work with peer towards shared goal.  Patient will identify skills used to make activity successful.  Patient will identify how skills used during activity can be applied to reach post d/c goals.   Group Description: [employer]. In teams of 5-6, patients were given 11 craft pipe cleaners. Using the materials provided, patients were instructed to compete again the opposing team(s) to build the tallest free-standing structure from floor level. The activity was timed; difficulty increased by Probation officer as Pharmacist, hospital continued.  Systematically resources were removed with additional directions for example, placing one arm behind their back, working in silence, and shape stipulations. LRT facilitated post-activity discussion reviewing team processes and necessary communication skills involved in completion. Patients were encouraged to reflect how the skills utilized, or not utilized, in this activity can be incorporated to positively impact support systems post discharge.   Affect/Mood: N/A   Participation Level: Did not attend    Clinical Observations/Individualized Feedback:      Plan: Continue to engage patient in RT group sessions 2-3x/week.   Joyice Magda-McCall, LRT,CTRS 11/15/2022 11:42 AM

## 2022-11-15 NOTE — Progress Notes (Signed)
Patient cooperative and med compliant. Patient denies SI,HI, and A/V/H with no plan or intent. Patient attended some groups and socializing appropriately. No s/s of current distress.

## 2022-11-15 NOTE — Plan of Care (Signed)
  Problem: Safety: Goal: Periods of time without injury will increase Outcome: Progressing   Problem: Safety: Goal: Ability to disclose and discuss suicidal ideas will improve Outcome: Progressing

## 2022-11-15 NOTE — Progress Notes (Addendum)
BP (!) 138/91   Pulse 95   Temp 98 F (36.7 C) (Oral)   Resp 18   Ht 5\' 7"  (1.702 m)   Wt 111.4 kg   LMP 11/08/2022   SpO2 100%   BMI 38.47 kg/m   Patient hit starr alarm this evening stating she was feeling the "walls closing in." Patient stated feeling nauseous, light headed, and anxious. Patient stated she did not know if it was the new medication ordered or her lack of nicotine today as patient refused nicotine patch this morning. Patient stated she was trying to go without nicotine but did find herself craving it throughout the day and possibly experiencing some withdrawal. Discussed with patient about the pros and cons of nicotine patch and nicotine gum as patient stated she was scared to try them as she was not familiar with them. Patient stated she would accept her nicotine patch tomorrow and not try to go cold [place]. Patient given hydroxyzine for anxiety and zofran po prn for nausea along with ginger ale. Patient encouraged to remain hydrated. Vs checked and slightly elevated but patient remains stable. Patient in no current distress.

## 2022-11-15 NOTE — Tx Team (Signed)
Initial Treatment Plan 11/15/2022 12:54 AM [name] [name]    PATIENT STRESSORS: Financial difficulties   Health problems     PATIENT STRENGTHS: Marketing executive fund of knowledge  Motivation for treatment/growth  Supportive family/friends    PATIENT IDENTIFIED PROBLEMS: " Coping Skills"  " Anger Management"                   DISCHARGE CRITERIA:  Improved stabilization in mood, thinking, and/or behavior Motivation to continue treatment in a less acute level of care Verbal commitment to aftercare and medication compliance  PRELIMINARY DISCHARGE PLAN: Outpatient therapy Return to previous living arrangement  PATIENT/FAMILY INVOLVEMENT: This treatment plan has been presented to and reviewed with the patient, Melissa [name],   The patient has been given the opportunity to ask questions and make suggestions.  Lucia Bitter, RN 11/15/2022, 12:54 AM

## 2022-11-15 NOTE — BHH Group Notes (Signed)
[place] Group Notes:  (Nursing/MHT/Case Management/Adjunct)  Date:  11/15/2022  Time:  9:51 AM  Type of Therapy:  Group Therapy  Participation Level:  Active  Participation Quality:  Appropriate  Affect:  Appropriate  Cognitive:  Appropriate  Insight:  Appropriate  Engagement in Group:  Engaged  Modes of Intervention:  Discussion  Summary of Progress/Problems:  Patient attended and participated in a goals/ orientation group today.   Elza Rafter 11/15/2022, 9:51 AM

## 2022-11-15 NOTE — [facility] Suicide Risk Assessment (Signed)
Suicide Risk Assessment  Admission Assessment    [facility] Admission Suicide Risk Assessment  Nursing information obtained from:    Demographic factors:  Low socioeconomic status Current Mental Status:  Suicidal ideation indicated by patient Loss Factors:  Financial problems / change in socioeconomic status Historical Factors:  NA Risk Reduction Factors:  Responsible for children under 23 years of age, Living with another person, especially a relative, Positive social support, Sense of responsibility to family  Total Time spent with patient: 30 minutes Principal Problem: Severe recurrent major depression with psychotic features ([place]) Diagnosis:  Principal Problem:   Severe recurrent major depression with psychotic features ([place])  Subjective Data:This is the second admission of this 22 year old African-American female to [facility] 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 from [facility] for worsening depressive symptoms and progressive suicidal ideations ongoing for 2 months without specific plans, and not being safe if discharged to home.   Continued Clinical Symptoms:  Alcohol Use Disorder Identification Test Final Score (AUDIT): 2 The "Alcohol Use Disorders Identification Test", Guidelines for Use in Primary Care, Second Edition.  World Pharmacologist [facility]). Score between 0-7:  no or low risk or alcohol related problems. Score between 8-15:  moderate risk of alcohol related problems. Score between 16-19:  high risk of alcohol related problems. Score 20 or above:  warrants further diagnostic evaluation for alcohol dependence and treatment.  CLINICAL FACTORS:   Severe Anxiety and/or  Agitation Panic Attacks Depression:   Anhedonia Impulsivity Insomnia Severe Postpartum Depression Alcohol/Substance Abuse/Dependencies More than one psychiatric diagnosis Previous Psychiatric Diagnoses and Treatments  Musculoskeletal: Strength & Muscle Tone: within normal limits Gait & Station: normal Patient leans: N/A  Psychiatric Specialty Exam:  Presentation  General Appearance:  Appropriate for Environment; Casual  Eye Contact: Good  Speech: Clear and Coherent; Normal Rate  Speech Volume: Normal  Handedness: Right  Mood and Affect  Mood: Anxious; Depressed (Tired)  Affect: Depressed; Congruent  Thought Process  Thought Processes: Coherent  Descriptions of Associations:Intact  Orientation:Full (Time, Place and Person)  Thought Content:Logical; WDL  History of Schizophrenia/Schizoaffective disorder:No  Duration of Psychotic Symptoms:No data recorded Hallucinations:Hallucinations: None Description of Auditory Hallucinations: Last auditory hallucination was in February 2024.  At that time hearing voices the heart has self and hearing baby cry.  Ideas of Reference:None  Suicidal Thoughts:Suicidal Thoughts: No SI Passive Intent and/or Plan: -- (Denies)  Homicidal Thoughts:Homicidal Thoughts: No  Sensorium  Memory: Immediate Good; Recent Good  Judgment: Fair  Insight: Fair  Community education officer  Concentration: Good  Attention Span: Good  Recall: [place] of Knowledge: Fair  Language: Good  Psychomotor Activity  Psychomotor Activity: Psychomotor Activity: Normal  Assets  Assets: Communication Skills; Desire for Improvement; Housing; Physical Health; Resilience; Social Support  Sleep  Sleep: Sleep: Good Number of Hours of Sleep: 8  Physical Exam: Physical Exam Vitals and nursing note reviewed.  Constitutional:      Appearance: She is obese.  HENT:     Head: Normocephalic.     Nose: Nose normal.      Mouth/Throat:     Mouth: Mucous membranes are moist.  Eyes:     Conjunctiva/sclera: Conjunctivae normal.     Pupils: Pupils are equal, round, and reactive to light.  Cardiovascular:  Rate and Rhythm: Normal rate.     Pulses: Normal pulses.  Pulmonary:     Effort: Pulmonary effort is normal.  Abdominal:     Palpations: Abdomen is soft.  Genitourinary:    Comments: Deferred Musculoskeletal:        General: Normal range of motion.     Cervical back: Normal range of motion.  Skin:    General: Skin is warm.  Neurological:     General: No focal deficit present.     Mental Status: She is alert and oriented to person, place, and time.  Psychiatric:        Mood and Affect: Mood normal.        Behavior: Behavior normal.    Review of Systems  Constitutional: Negative.   HENT: Negative.    Cardiovascular: Negative.   Gastrointestinal: Negative.   Genitourinary: Negative.   Musculoskeletal: Negative.   Skin: Negative.   Neurological:  Positive for seizures (History of seizures, last seizure episode 2015) and loss of consciousness.  Psychiatric/Behavioral:  Positive for depression and substance abuse. The patient is nervous/anxious and has insomnia.    Blood pressure 120/80, pulse 83, temperature 98 F (36.7 C), temperature source Oral, resp. rate 18, height 5\' 7"  (1.702 m), weight 111.4 kg, last menstrual period 11/08/2022, SpO2 100 %, unknown if currently breastfeeding. Body mass index is 38.47 kg/m.   COGNITIVE FEATURES THAT CONTRIBUTE TO RISK:  Polarized thinking    SUICIDE RISK:   Severe:  Frequent, intense, and enduring suicidal ideation, specific plan, no subjective intent, but some objective markers of intent (i.e., choice of lethal method), the method is accessible, some limited preparatory behavior, evidence of impaired self-control, severe dysphoria/symptomatology, multiple risk factors present, and few if any protective factors, particularly a lack of social  support.  PLAN OF CARE: Treatment Plan Summary: Daily contact with patient to assess and evaluate symptoms and progress in treatment and Medication management  Observation Level/Precautions:  15 minute checks  Laboratory:  CBC Chemistry Profile HbAIC UDS UA  Psychotherapy: Therapeutic milieu  Medications: See MAR  Consultations: Social work  Discharge Concerns: Safety  Estimated LOS: 5 to 7 days  Other:  n/a   Labs:  CMP: Within normal limits  Lipid profile: LDL 101 high, HDL 37 low, otherwise normal  CBC with differential: MCV 77.1 low, otherwise normal  Pituitary related: Prolactin 14.4 normal.  Diabetes: Hemoglobin A1c 5.3, glucose 76.  All within normal limits  Thyroid: TSH 1.093, within normal limits  Viral test: Negative  UDS: Negative for substances  Urinalysis: Appearance hazy, Hgb urine dipstick large, leukocytes small protein 30  EKG: Normal sinus rhythm, ventricular rate 75, QTc/QTcB 388/433  Physician Treatment Plan for Primary Diagnosis: Severe recurrent major depression with psychotic features ([place]) Plan: --Discontinue Abilify tablet 5 mg p.o. 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 daily with breakfast for depression -- Resume medroxyprogesterone Depo-Provera injection IM 1 on three 2824 x 1 dose only   Other medical medications: -- Continue nicotine (NicoDerm CQ-dose in mg/24 hours patch 14 mg transdermal over 24 hours daily.   Agitation protocol: Olanzapine Zyprexa disintegrating tab 10 mg p.o. every 8 hours as needed agitation and Lorazepam Ativan tablet 1 mg p.o. as needed anxiety, severe agitation and Ziprasidone Geodon injection 20 mg IM as needed agitation   Other PRN Medications -Acetaminophen 650 mg every 6 as needed/mild pain -Maalox 30 mL oral every 4 as needed/digestion -  Magnesium hydroxide 30 mL daily as needed/mild  constipation --Initiate Zofran 4 mg p.o. every 8 hours as needed for nausea or vomiting   Safety and Monitoring: Voluntary admission to inpatient psychiatric unit for safety, stabilization and treatment Daily contact with patient to assess and evaluate symptoms and progress in treatment Patient's case to be discussed in multi-disciplinary team meeting Observation Level : q15 minute checks Vital signs: q12 hours Precautions: suicide, but pt currently verbally contracts for safety on unit    Discharge Planning: Social work and case management to assist with discharge planning and identification of hospital follow-up needs prior to discharge Estimated LOS: 5-7 days Discharge Concerns: Need to establish a safety plan; Medication compliance and effectiveness Discharge Goals: Return home with outpatient referrals for mental health follow-up including medication management/psychotherapy.  Long Term Goal(s): Improvement in symptoms so as ready for discharge  Short Term Goals: Ability to identify changes in lifestyle to reduce recurrence of condition will improve, Ability to verbalize feelings will improve, Ability to disclose and discuss suicidal ideas, Ability to demonstrate self-control will improve, Ability to identify and develop effective coping behaviors will improve, Ability to maintain clinical measurements within normal limits will improve, Compliance with prescribed medications will improve, and Ability to identify triggers associated with substance abuse/mental health issues will improve  Physician Treatment Plan for Secondary Diagnosis: Principal Problem:   Severe recurrent major depression with psychotic features ([place])  I certify that inpatient services furnished can reasonably be expected to improve the patient's condition.   Laretta Bolster, FNP 11/15/2022, 10:26 AM

## 2022-11-15 NOTE — BH IP Treatment Plan (Signed)
Interdisciplinary Treatment and Diagnostic Plan Update  11/15/2022 Time of Session: 11:50 AM  Melissa [name] MRN: [id number]  Principal Diagnosis: Severe recurrent major depression with psychotic features [facility])  Secondary Diagnoses: Principal Problem:   Severe recurrent major depression with psychotic features ([place])   Current Medications:  Current Facility-Administered Medications  Medication Dose Route Frequency Provider Last Rate Last Admin   acetaminophen (TYLENOL) tablet 650 mg  650 mg Oral Q6H PRN Scot Jun, NP       alum & mag hydroxide-simeth (MAALOX/MYLANTA) 200-200-20 MG/5ML suspension 30 mL  30 mL Oral Q4H PRN Scot Jun, NP       ARIPiprazole (ABILIFY) tablet 5 mg  5 mg Oral Daily Scot Jun, NP   5 mg at 11/15/22 0809   hydrOXYzine (ATARAX) tablet 25 mg  25 mg Oral TID PRN Scot Jun, NP   25 mg at 11/15/22 1148   OLANZapine zydis (ZYPREXA) disintegrating tablet 10 mg  10 mg Oral Q8H PRN Scot Jun, NP       And   LORazepam (ATIVAN) tablet 1 mg  1 mg Oral PRN Scot Jun, NP       And   ziprasidone (GEODON) injection 20 mg  20 mg Intramuscular PRN Scot Jun, NP       magnesium hydroxide (MILK OF MAGNESIA) suspension 30 mL  30 mL Oral Daily PRN Scot Jun, NP       nicotine (NICODERM CQ - dosed in mg/24 hours) patch 14 mg  14 mg Transdermal Daily Evette Georges, NP       venlafaxine XR (EFFEXOR-XR) 24 hr capsule 75 mg  75 mg Oral Q breakfast Scot Jun, NP   75 mg at 11/15/22 0809   PTA Medications: Medications Prior to Admission  Medication Sig Dispense Refill Last Dose   albuterol (VENTOLIN HFA) 108 (90 Base) MCG/ACT inhaler Inhale 1 puff into the lungs 4 (four) times daily as needed for wheezing or shortness of breath.      cyclobenzaprine (FLEXERIL) 10 MG tablet Take 1 tablet (10 mg total) by mouth 2 (two) times daily as needed for muscle spasms. 20 tablet 0    ferrous sulfate 325 (65 FE)  MG tablet Take 1 tablet (325 mg total) by mouth at bedtime. (Patient taking differently: Take 325 mg by mouth every other day.) 30 tablet 3    hydrOXYzine (ATARAX) 25 MG tablet Take 1 tablet (25 mg total) by mouth 3 (three) times daily as needed for anxiety. TAKE 1 TABLET BY MOUTH 3 TIMES DAILY AS NEEDED FOR ITCHING. (Patient taking differently: Take 25 mg by mouth 3 (three) times daily as needed for anxiety.) 90 tablet 1    ibuprofen (ADVIL) 800 MG tablet Take 1 tablet (800 mg total) by mouth every 8 (eight) hours as needed for moderate pain. 21 tablet 0    loratadine (CLARITIN) 10 MG tablet Take 10 mg by mouth daily.      medroxyPROGESTERone Acetate 150 MG/ML SUSY Inject 150 mg into the muscle every 3 (three) months.      valACYclovir (VALTREX) 1000 MG tablet Take 1 tablet (1,000 mg total) by mouth 2 (two) times daily. 20 tablet 5     Patient Stressors: Financial difficulties   Health problems    Patient Strengths: Marketing executive fund of knowledge  Motivation for treatment/growth  Supportive family/friends   Treatment Modalities: Medication Management, Group therapy, Case management,  1 to 1 session with clinician,  Psychoeducation, Recreational therapy.   Physician Treatment Plan for Primary Diagnosis: Severe recurrent major depression with psychotic features ([place]) Long Term Goal(s): Improvement in symptoms so as ready for discharge   Short Term Goals: Ability to identify changes in lifestyle to reduce recurrence of condition will improve Ability to verbalize feelings will improve Ability to disclose and discuss suicidal ideas Ability to demonstrate self-control will improve Ability to identify and develop effective coping behaviors will improve Ability to maintain clinical measurements within normal limits will improve Compliance with prescribed medications will improve Ability to identify triggers associated with substance abuse/mental health issues will  improve  Medication Management: Evaluate patient's response, side effects, and tolerance of medication regimen.  Therapeutic Interventions: 1 to 1 sessions, Unit Group sessions and Medication administration.  Evaluation of Outcomes: Not Progressing  Physician Treatment Plan for Secondary Diagnosis: Principal Problem:   Severe recurrent major depression with psychotic features ([place])  Long Term Goal(s): Improvement in symptoms so as ready for discharge   Short Term Goals: Ability to identify changes in lifestyle to reduce recurrence of condition will improve Ability to verbalize feelings will improve Ability to disclose and discuss suicidal ideas Ability to demonstrate self-control will improve Ability to identify and develop effective coping behaviors will improve Ability to maintain clinical measurements within normal limits will improve Compliance with prescribed medications will improve Ability to identify triggers associated with substance abuse/mental health issues will improve     Medication Management: Evaluate patient's response, side effects, and tolerance of medication regimen.  Therapeutic Interventions: 1 to 1 sessions, Unit Group sessions and Medication administration.  Evaluation of Outcomes: Not Progressing   RN Treatment Plan for Primary Diagnosis: Severe recurrent major depression with psychotic features ([place]) Long Term Goal(s): Knowledge of disease and therapeutic regimen to maintain health will improve  Short Term Goals: Ability to remain free from injury will improve, Ability to verbalize frustration and anger appropriately will improve, Ability to demonstrate self-control, Ability to participate in decision making will improve, Ability to verbalize feelings will improve, Ability to disclose and discuss suicidal ideas, Ability to identify and develop effective coping behaviors will improve, and Compliance with prescribed medications will improve  Medication  Management: RN will administer medications as ordered by provider, will assess and evaluate patient's response and provide education to patient for prescribed medication. RN will report any adverse and/or side effects to prescribing provider.  Therapeutic Interventions: 1 on 1 counseling sessions, Psychoeducation, Medication administration, Evaluate responses to treatment, Monitor vital signs and CBGs as ordered, Perform/monitor CIWA, COWS, AIMS and Fall Risk screenings as ordered, Perform wound care treatments as ordered.  Evaluation of Outcomes: Not Progressing   LCSW Treatment Plan for Primary Diagnosis: Severe recurrent major depression with psychotic features ([place]) Long Term Goal(s): Safe transition to appropriate next level of care at discharge, Engage patient in therapeutic group addressing interpersonal concerns.  Short Term Goals: Engage patient in aftercare planning with referrals and resources, Increase social support, Increase ability to appropriately verbalize feelings, Increase emotional regulation, Facilitate acceptance of mental health diagnosis and concerns, Facilitate patient progression through stages of change regarding substance use diagnoses and concerns, Identify triggers associated with mental health/substance abuse issues, and Increase skills for wellness and recovery  Therapeutic Interventions: Assess for all discharge needs, 1 to 1 time with Social worker, Explore available resources and support systems, Assess for adequacy in community support network, Educate family and significant other(s) on suicide prevention, Complete Psychosocial Assessment, Interpersonal group therapy.  Evaluation of Outcomes: Not Progressing  Progress in Treatment: Attending groups: Yes. Participating in groups: Yes. Taking medication as prescribed: Yes. Toleration medication: Yes. Family/Significant other contact made: No, will contact:  Whoever pt give CSW permission to contact  Patient  understands diagnosis: Yes. Discussing patient identified problems/goals with staff: Yes. Medical problems stabilized or resolved: Yes. Denies suicidal/homicidal ideation: Yes. Issues/concerns per patient self-inventory: No.   New problem(s) identified: No, Describe:  None Reported   New Short Term/Long Term Goal(s):medication stabilization, elimination of SI thoughts, development of comprehensive mental wellness plan.    Patient Goals:  " Work on what triggers my anxiety, panic attacks, and depression "   Discharge Plan or Barriers: Patient recently admitted. CSW will continue to follow and assess for appropriate referrals and possible discharge planning.    Reason for Continuation of Hospitalization: Anxiety Depression Medication stabilization Suicidal ideation  Estimated Length of Stay: 3-5 days   Last 3 [place] Suicide Severity Risk Score: [place] Admission (Current) from 11/14/2022 in [place] [facility] Most recent reading at 11/14/2022 10:00 PM ED from 11/14/2022 in [facility] Most recent reading at 11/14/2022  8:57 AM ED from 11/08/2022 in [facility] [place] Most recent reading at 11/08/2022  5:00 PM  C-SSRS RISK CATEGORY No Risk Moderate Risk No Risk       Last PHQ 2/9 Scores:    04/07/2022   10:55 AM 02/08/2022    1:49 PM 12/15/2021    9:34 AM  Depression screen PHQ 2/9  Decreased Interest 1 2 1   Down, Depressed, Hopeless 1 2 0  PHQ - 2 Score 2 4 1   Altered sleeping 0 2   Tired, decreased energy 1 1   Change in appetite 0 0   Feeling bad or failure about yourself  0 0   Trouble concentrating 1 1   Moving slowly or fidgety/restless 1 1   Suicidal thoughts 0 0   PHQ-9 Score 5 9     Scribe for Treatment Team: Charlett Lango 11/15/2022 2:01 PM

## 2022-11-15 NOTE — Progress Notes (Signed)
Patient ID: Melissa [name], female   DOB: [birth year], 22 y.o.   MRN: [id number]   Patient is voluntary admission  to [facility] from [facility]  for  depression, anxiety, sadness, and SI with no plan. Patient denies SI/HI/AVH.  Patient reports stressors as having a new born , lack of employment and issues with anger management..Skin assessment completed , no contraband found, unit orientated completed. Food and drink offered  at  this time. No further complain voiced by patient, Q15 minutes close observation in progress.

## 2022-11-15 NOTE — Group Note (Signed)
Date:  11/15/2022 Time:  3:48 PM  Group Topic/Focus:  Emotional Education:   The focus of this group is to discuss what feelings/emotions are, and how they are experienced.    Participation Level:  Active  Participation Quality:  Appropriate  Affect:  Appropriate  Cognitive:  Appropriate  Insight: Appropriate  Engagement in Group:  Engaged  Modes of Intervention:  Education and Exploration  Additional Comments:     Jerrye Beavers 11/15/2022, 3:48 PM

## 2022-11-15 NOTE — Progress Notes (Signed)
   11/15/22 0533  15 Minute Checks  Location Bedroom  Visual Appearance Calm  Behavior Sleeping  Sleep (Behavioral Health Patients Only)  Calculate sleep? (Click Yes once per 24 hr at 0600 safety check) Yes  Documented sleep last 24 hours 6.25

## 2022-11-15 NOTE — H&P (Cosign Needed Addendum)
Psychiatric Admission Assessment Adult  Patient Identification: Melissa [name] MRN:  [id number] Date of Evaluation:  11/15/2022 Chief Complaint:  Severe recurrent major depression with psychotic features ([place]) [F33.3] Principal Diagnosis: Severe recurrent major depression with psychotic features ([place]) Diagnosis:  Principal Problem:   Severe recurrent major depression with psychotic features ([place])  CC: " I am a single parents and overwhelmed with stressors of taking care of my 3 children, with the 20-month-old baby recently being discharged from [facility] with shunt placement from brain bleed."  History of Present Illness: This is the second admission of this 22 year old African-American female to [facility]. 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 During this evaluation Melissa [name] reports:  " I have been stressed out as a single mother for the past 2 months.  I have 3 children ages 20 years old, 82-year-old and 34-month-old.  The 62-month-old just recently discharged from NICU at [facility] with shunt placement due to intracranial bleed.  He is on disability right now  and we are currently living with my parents who is helping with the kids.  I do not have enough sleep due to children care and I am experiencing postpartum depression.  I am also looking for a part-time job and this stresses are overwhelming for [name]."    Reports overwhelming hopelessness, recurrent thoughts of death and dying, powerlessness, guilt, anhedonia, poor  sleep, and overeating.  Patient reports she was admitted to [facility] in April 2023 and was prescribed Effexor, Abilify, and hydroxyzine, then after discharge she became pregnant and stopped taking these medications.  She also reported being suicidal at age 60 and at that time, she overdosed on her seizures medication and was admitted to the hospital for 2 weeks.  Patient reports other stressors to include pending legal larceny charges for shoplifting at [facility] scheduled for 03/08/2023.  Assessment: Patient presents alert with teary eyes during the examination.  Mood is irritable, anxious, however, cooperative during the examination.  Able to maintain good eye contact with the provider and speech is clear with normal volume.  Denies SI, HI, AVH.  However patient states that she was hearing voices during the period of her postpartum depression.  Objectively, there is no evidence of paranoia or delusional thinking, or responding to internal stimuli.  Patient is admitted for mood stabilization, medication management and safety.  Addendum: During this assessment, representative from Melissa [name] came to interview patient for her children care situation.  Not sure the full outcome of this interview at this time.  Patient was disturbed regarding the fate of her children being taken away from her.  Mode of transport to Hospital: SAFE transport  Current Outpatient (Home) Medication List: Albuterol Ventolin HFA 108 90 base MCG/ACT inhaler inhale 1 puff into the lungs 4 times daily as needed for wheezing and shortness of breath Flexeril 10 mg tablet 1 p.o. 2 times daily as needed for muscle spasm Iron sulfate 325 mg tablet 1 p.o. by mouth at bedtime Hydroxyzine 25 mg tablet 1 p.o. 3 times daily as needed for anxiety Ibuprofen 800 mg tablet 1 p.o. every 8 hours as needed for moderate  pain Claritin 10 mg tablet 1 p.o. daily Medroxyprogesterone acetate 150 mg Per mL SUSY inject 150 mg into the muscles every 3  months Bowel Aciclovir 1000 mg tablet 1 p.o. 2 times daily PRN medication prior to evaluation:  ED course: Patient was seen and evaluated at [facility] behavioral health urgent care, stabilized and disposition to [facility] for psychiatric inpatient admission.  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 Current/prior outpatient treatment: Yes, at [facility] Prior rehab hx: Denies Psychotherapy hx: Yes History of suicide: Yes History of homicide or aggression: Denies Psychiatric medication history: Yes, was prescribed Effexor, Abilify, hydroxyzine. Psychiatric medication compliance history: Yes, except during pregnancy from April 2023 to current Neuromodulation history: Denies Current Psychiatrist: Denies Current therapist: Denies  Substance Abuse Hx: Alcohol: Denies Tobacco: Smokes half a pack per day Illicit drugs: Denies Rx drug abuse: Denies Rehab hx: Denies  Past Medical History: Medical Diagnoses: History of asthma Home Rx: Yes Prior Hosp: Seizures treatment and discharge in 2014 Prior Surgeries/Trauma: Head trauma, LOC, concussions, seizures:  Allergies: See allergy listing LMP: March 2024 Contraception:Medroxyprogesterone acetate 150 mg Per mL Soucie inject 150 mg into the muscles every 3 months PCP: No  Family History: Medical: Mom has diabetes Psych: Father has history of schizophrenia Psych Rx: Yes SA/HA: Denies Substance use family hx: Patient denies  Social History: Childhood (bring, raised, lives now, parents, siblings, schooling, education): Has high school diploma Abuse: None reported Marital Status:  Single Sexual orientation: Female from birth Children: 3 children Employment: Unemployed Peer Group: Denies Housing: Lives with parents  Finances: Financial difficulty Legal: Pending court case March 08, 2023 for larceny charges by [employer]: Denies  Associated Signs/Symptoms: Depression Symptoms:  depressed mood, anhedonia, insomnia, fatigue, feelings of worthlessness/guilt, hopelessness, anxiety, panic attacks, disturbed sleep, weight gain,  (Hypo) Manic Symptoms:  Impulsivity, Irritable Mood, Labiality of Mood,  Anxiety Symptoms:  Excessive Worry, Panic Symptoms, Social Anxiety,  Psychotic Symptoms:   Not applicable  PTSD Symptoms: NA  Total Time spent with patient: 45 minutes  Past Psychiatric History: Patient had 2 psychiatric inpatient admission in the past.  Was admitted at the age of 22 years old due to overdosing on her seizures medications, however patient does not remember the name of the facility.  Secondary psychiatric inpatient admission at [facility] in April 2023, was treated and discharged on Effexor, Abilify, and hydroxyzine.  Patient reports compliance with medications however stopped taking them due to pregnancy from April 2023 to present.  Is the patient at risk to self? No.  Has the patient been a risk to self in the past 6 months? Yes.    Has the patient been a risk to self within the distant past? Yes.    Is the patient a risk to others? No.  Has the patient been a risk to others in the past 6 months? No.  Has the patient been a risk to others within the distant past? No.   [place] Scale:  [place] Admission (Current) from 11/14/2022 in [place] [facility] Most recent reading at 11/14/2022 10:00 PM ED from 11/14/2022 in [facility] Most recent reading at 11/14/2022  8:56 AM ED from 11/08/2022 in [facility] Most recent reading at  11/08/2022  5:00 PM  C-SSRS RISK CATEGORY No Risk Moderate Risk No Risk        Prior Inpatient Therapy: Yes.   If yes, describe: X 2, in 2019 and 2023 both times for suicidal ideation and depression Prior Outpatient Therapy: Yes.   If yes, describe: Outpatient treatment from [facility]  Alcohol Screening: 1. How often do you have a drink containing alcohol?: 2 to 4 times a month 2. How many drinks containing alcohol do you have on a typical day when you are drinking?: 1 or 2 3. How often do you have six or more drinks on one occasion?: Never AUDIT-C Score: 2 4. How often during the last year have you found that you were not able to stop drinking once you had started?: Never 5. How often during the last year have you failed to do what was normally expected from you because of drinking?: Never 6. How often during the last year have you needed a first drink in the morning to get yourself going after a heavy drinking session?: Never 7. How often during the last year have you had a feeling of guilt of remorse after drinking?: Never 8. How often during the last year have you been unable to remember what happened the night before because you had been drinking?: Never 9. Have you or someone else been injured as a result of your drinking?: No 10. Has a relative or friend or a doctor or another health worker been concerned about your drinking or suggested you cut down?: No Alcohol Use Disorder Identification Test Final Score (AUDIT): 2  Substance Abuse History in the last 12 months:  No.  Consequences of Substance Abuse: NA Previous Psychotropic Medications: Yes   Psychological Evaluations: Yes  Past Medical History:  Past Medical History:  Diagnosis Date   Anemia    Anxiety    Asthma    inhaler. last attack June [id number]   Complication of anesthesia    Congenital hydronephrosis 2001   Constipation    Depression    Episodic tension-type headache, not intractable 04/16/2015   Family history of  adverse reaction to anesthesia    mom rushed to hospital from Dentist office, pt her mother & grandmother have trouble waking fully after anesthesia   Genital herpes    Low back strain, sequela 11/10/2020   Migraine without aura and without status migrainosus, not intractable 04/16/2015   PID (pelvic inflammatory disease)    Post-partum depression 11/17/2021   Seizures ([place])    last one in 2015 - on meds   Sickle cell trait ([name])    TBI (traumatic brain injury) ([place]) 2006    Past Surgical History:  Procedure Laterality Date   APPENDECTOMY     CESAREAN SECTION N/A 08/31/2021   Procedure: CESAREAN SECTION;  Surgeon: Gwynne Edinger, MD;  Location: [facility] LD ORS;  Service: Obstetrics;  Laterality: N/A;   CESAREAN SECTION N/A 06/05/2022   Procedure: CESAREAN SECTION;  Surgeon: Chancy Milroy, MD;  Location: [facility] LD ORS;  Service: Obstetrics;  Laterality: N/A;   HERNIA REPAIR     INTESTINAL MALROTATION REPAIR  2001   Family History:  Family History  Problem Relation Age of Onset   Depression Mother    Stroke Mother    Obesity Mother    Post-traumatic stress disorder Mother    Anxiety disorder Mother    Hypertension Mother  Diabetes Mother    Schizophrenia Father    Kidney disease Father    Family Psychiatric  History: Father father has history of schizophrenia   Tobacco Screening:  Social History   Tobacco Use  Smoking Status Every Day   Packs/day: .25   Types: Cigarettes  Smokeless Tobacco Never  Tobacco Comments   black and milds, 2 cigs daily    BH Tobacco Counseling     Are you interested in Tobacco Cessation Medications?  Yes, implement Nicotene Replacement Protocol Counseled patient on smoking cessation:  Refused/Declined practical counseling Reason Tobacco Screening Not Completed: No value filed.       Social History:  Social History   Substance and Sexual Activity  Alcohol Use Not Currently   Comment: occasionally, prior to pregnancy     Social  History   Substance and Sexual Activity  Drug Use Not Currently   Types: Marijuana   Comment: last 2020    Additional Social History:   Allergies:   Allergies  Allergen Reactions   Benadryl [Diphenhydramine Hcl] Other (See Comments)    Causes seizures    Gluten Meal Other (See Comments)    GI Intolerance   Shellfish Allergy Anaphylaxis and Other (See Comments)    Patient states her throat gets tight   Zithromax [Azithromycin] Anaphylaxis and Swelling   Benadryl Anti-Itch Childrens [Camphor]    Haldol [Haloperidol] Swelling and Other (See Comments)    Tongue swelling.   Haloperidol And Related    Lactose Intolerance (Gi) Diarrhea   Latex Swelling and Other (See Comments)    Labial edema after usage of latex condoms.    Lab Results:  Results for orders placed or performed during the hospital encounter of 11/14/22 (from the past 48 hour(s))  POC urine preg, ED     Status: Normal   Collection Time: 11/14/22  8:26 AM  Result Value Ref Range   Preg Test, Ur Negative Negative  POCT Urine Drug Screen - (I-Screen)     Status: Normal   Collection Time: 11/14/22  8:26 AM  Result Value Ref Range   POC Amphetamine UR None Detected NONE DETECTED (Cut Off Level 1000 ng/mL)   POC Secobarbital (BAR) None Detected NONE DETECTED (Cut Off Level 300 ng/mL)   POC Buprenorphine (BUP) None Detected NONE DETECTED (Cut Off Level 10 ng/mL)   POC Oxazepam (BZO) None Detected NONE DETECTED (Cut Off Level 300 ng/mL)   POC Cocaine UR None Detected NONE DETECTED (Cut Off Level 300 ng/mL)   POC Methamphetamine UR None Detected NONE DETECTED (Cut Off Level 1000 ng/mL)   POC Morphine None Detected NONE DETECTED (Cut Off Level 300 ng/mL)   POC Methadone UR None Detected NONE DETECTED (Cut Off Level 300 ng/mL)   POC Oxycodone UR None Detected NONE DETECTED (Cut Off Level 100 ng/mL)   POC Marijuana UR None Detected NONE DETECTED (Cut Off Level 50 ng/mL)  POC SARS Coronavirus 2 Ag     Status: None    Collection Time: 11/14/22  9:02 AM  Result Value Ref Range   SARSCOV2ONAVIRUS 2 AG NEGATIVE NEGATIVE    Comment: (NOTE) SARS-CoV-2 antigen NOT DETECTED.   Negative results are presumptive.  Negative results do not preclude SARS-CoV-2 infection and should not be used as the sole basis for treatment or other patient management decisions, including infection  control decisions, particularly in the presence of clinical signs and  symptoms consistent with COVID-19, or in those who have been in contact with the virus.  Negative  results must be combined with clinical observations, patient history, and epidemiological information. The expected result is Negative.  Fact Sheet for Patients: HandmadeRecipes.com.cy  Fact Sheet for Healthcare Providers: FuneralLife.at  This test is not yet approved or cleared by the [place] FDA and  has been authorized for detection and/or diagnosis of SARS-CoV-2 by FDA under an Emergency Use Authorization (EUA).  This EUA will remain in effect (meaning this test can be used) for the duration of  the COV ID-19 declaration under Section 564(b)(1) of the Act, 21 U.S.C. section 360bbb-3(b)(1), unless the authorization is terminated or revoked sooner.    Urinalysis, Routine w reflex microscopic -Urine, Clean Catch     Status: Abnormal   Collection Time: 11/14/22 10:29 AM  Result Value Ref Range   Color, Urine YELLOW YELLOW   APPearance HAZY (A) CLEAR   Specific Gravity, Urine 1.017 1.005 - 1.030   pH 7.0 5.0 - 8.0   Glucose, UA NEGATIVE NEGATIVE mg/dL   Hgb urine dipstick LARGE (A) NEGATIVE   Bilirubin Urine NEGATIVE NEGATIVE   Ketones, ur NEGATIVE NEGATIVE mg/dL   Protein, ur 30 (A) NEGATIVE mg/dL   Nitrite NEGATIVE NEGATIVE   Leukocytes,Ua SMALL (A) NEGATIVE   RBC / HPF >50 0 - 5 RBC/hpf   WBC, UA 11-20 0 - 5 WBC/hpf   Bacteria, UA RARE (A) NONE SEEN   Squamous Epithelial / HPF 0-5 0 - 5 /HPF     Comment: Performed at [facility], [address] [place], [place] [address]  CBC with Differential/Platelet     Status: Abnormal   Collection Time: 11/14/22 11:05 AM  Result Value Ref Range   WBC 8.5 4.0 - 10.5 K/uL   RBC 4.97 3.87 - 5.11 MIL/uL   Hemoglobin 13.0 12.0 - 15.0 g/dL   HCT 38.3 36.0 - 46.0 %   MCV 77.1 (L) 80.0 - 100.0 fL   MCH 26.2 26.0 - 34.0 pg   MCHC 33.9 30.0 - 36.0 g/dL   RDW 15.5 11.5 - 15.5 %   Platelets 255 150 - 400 K/uL   nRBC 0.0 0.0 - 0.2 %   Neutrophils Relative % 54 %   Neutro Abs 4.7 1.7 - 7.7 K/uL   Lymphocytes Relative 35 %   Lymphs Abs 3.0 0.7 - 4.0 K/uL   Monocytes Relative 8 %   Monocytes Absolute 0.7 0.1 - 1.0 K/uL   Eosinophils Relative 2 %   Eosinophils Absolute 0.2 0.0 - 0.5 K/uL   Basophils Relative 1 %   Basophils Absolute 0.0 0.0 - 0.1 K/uL   Immature Granulocytes 0 %   Abs Immature Granulocytes 0.01 0.00 - 0.07 K/uL    Comment: Performed at [facility] [address] [place], [name] [id number]  Basic metabolic panel     Status: None   Collection Time: 11/14/22 11:05 AM  Result Value Ref Range   Sodium 140 135 - 145 mmol/L   Potassium 3.6 3.5 - 5.1 mmol/L   Chloride 104 98 - 111 mmol/L   CO2 24 22 - 32 mmol/L   Glucose, Bld 76 70 - 99 mg/dL    Comment: Glucose reference range applies only to samples taken after fasting for at least 8 hours.   BUN 10 6 - 20 mg/dL   Creatinine, Ser 0.75 0.44 - 1.00 mg/dL   Calcium 9.4 8.9 - 10.3 mg/dL   GFR, Estimated >60 >60 mL/min    Comment: (NOTE) Calculated using the CKD-EPI Creatinine Equation (  2021)    Anion gap 12 5 - 15    Comment: Performed at [facility], [place] [address], [place], [place] [address]  Prolactin     Status: None   Collection Time: 11/14/22 11:05 AM  Result Value Ref Range   Prolactin 14.4 4.8 - 33.4 ng/mL    Comment: (NOTE) Performed At: [facility] [place], [place] [id number] Rush Farmer MD [id number]   Lipid  panel     Status: Abnormal   Collection Time: 11/14/22 11:05 AM  Result Value Ref Range   Cholesterol 153 0 - 200 mg/dL   Triglycerides 75 <150 mg/dL   HDL 37 (L) >40 mg/dL   Total CHOL/HDL Ratio 4.1 RATIO   VLDL 15 0 - 40 mg/dL   LDL Cholesterol 101 (H) 0 - 99 mg/dL    Comment:        Total Cholesterol/HDL:CHD Risk Coronary Heart Disease Risk Table                     Men   Women  1/2 Average Risk   3.4   3.3  Average Risk       5.0   4.4  2 X Average Risk   9.6   7.1  3 X Average Risk  23.4   11.0        Use the calculated Patient Ratio above and the CHD Risk Table to determine the patient's CHD Risk.        ATP III CLASSIFICATION (LDL):  <100     mg/dL   Optimal  100-129  mg/dL   Near or Above                    Optimal  130-159  mg/dL   Borderline  160-189  mg/dL   High  >190     mg/dL   Very High Performed at [facility] [address], [place], [place] [address]   TSH     Status: None   Collection Time: 11/14/22 11:05 AM  Result Value Ref Range   TSH 1.093 0.350 - 4.500 uIU/mL    Comment: Performed by a 3rd Generation assay with a functional sensitivity of <=0.01 uIU/mL. Performed at [facility], [place] [address] [place], [place] [address]   Ethanol     Status: None   Collection Time: 11/14/22 11:06 AM  Result Value Ref Range   Alcohol, Ethyl (B) <10 <10 mg/dL    Comment: (NOTE) Lowest detectable limit for serum alcohol is 10 mg/dL.  For medical purposes only. Performed at [facility], [place] [address] [place], [place] [address]   Hemoglobin A1c     Status: None   Collection Time: 11/14/22 11:08 AM  Result Value Ref Range   Hgb A1c MFr Bld 5.3 4.8 - 5.6 %    Comment: (NOTE)         Prediabetes: 5.7 - 6.4         Diabetes: >6.4         Glycemic control for adults with diabetes: <7.0    Mean Plasma Glucose 105 mg/dL    Comment: (NOTE) Performed At: [facility] [address] [place], [place] [id number] Rush Farmer MD  [id number]   SARS Coronavirus 2 by RT PCR (hospital order, performed in [facility] hospital lab) *cepheid single result test* Anterior Nasal Swab     Status: None   Collection Time:  11/14/22  5:11 PM   Specimen: Anterior Nasal Swab  Result Value Ref Range   SARS Coronavirus 2 by RT PCR NEGATIVE NEGATIVE    Comment: Performed at [facility], [address] [place], [place] [address]    Blood Alcohol level:  Lab Results  Component Value Date   [facility] <10 11/14/2022   ETH <10 [id number]    Metabolic Disorder Labs:  Lab Results  Component Value Date   HGBA1C 5.3 11/14/2022   MPG 105 11/14/2022   MPG 93.93 11/19/2021   Lab Results  Component Value Date   PROLACTIN 14.4 11/14/2022   PROLACTIN 8.8 01/01/2017   Lab Results  Component Value Date   CHOL 153 11/14/2022   TRIG 75 11/14/2022   HDL 37 (L) 11/14/2022   CHOLHDL 4.1 11/14/2022   VLDL 15 11/14/2022   LDLCALC 101 (H) 11/14/2022   LDLCALC 105 (H) 11/19/2021    Current Medications: Current Facility-Administered Medications  Medication Dose Route Frequency Provider Last Rate Last Admin   acetaminophen (TYLENOL) tablet 650 mg  650 mg Oral Q6H PRN Scot Jun, NP       alum & mag hydroxide-simeth (MAALOX/MYLANTA) 200-200-20 MG/5ML suspension 30 mL  30 mL Oral Q4H PRN Scot Jun, NP       ARIPiprazole (ABILIFY) tablet 5 mg  5 mg Oral Daily Scot Jun, NP   5 mg at 11/15/22 0809   hydrOXYzine (ATARAX) tablet 25 mg  25 mg Oral TID PRN Scot Jun, NP       OLANZapine zydis (ZYPREXA) disintegrating tablet 10 mg  10 mg Oral Q8H PRN Scot Jun, NP       And   LORazepam (ATIVAN) tablet 1 mg  1 mg Oral PRN Scot Jun, NP       And   ziprasidone (GEODON) injection 20 mg  20 mg Intramuscular PRN Scot Jun, NP       magnesium hydroxide (MILK OF MAGNESIA) suspension 30 mL  30 mL Oral Daily PRN Scot Jun, NP       nicotine (NICODERM CQ - dosed in mg/24 hours)  patch 14 mg  14 mg Transdermal Daily Evette Georges, NP       venlafaxine XR (EFFEXOR-XR) 24 hr capsule 75 mg  75 mg Oral Q breakfast Scot Jun, NP   75 mg at 11/15/22 0809   PTA Medications: Medications Prior to Admission  Medication Sig Dispense Refill Last Dose   albuterol (VENTOLIN HFA) 108 (90 Base) MCG/ACT inhaler Inhale 1 puff into the lungs 4 (four) times daily as needed for wheezing or shortness of breath.      cyclobenzaprine (FLEXERIL) 10 MG tablet Take 1 tablet (10 mg total) by mouth 2 (two) times daily as needed for muscle spasms. 20 tablet 0    ferrous sulfate 325 (65 FE) MG tablet Take 1 tablet (325 mg total) by mouth at bedtime. (Patient taking differently: Take 325 mg by mouth every other day.) 30 tablet 3    hydrOXYzine (ATARAX) 25 MG tablet Take 1 tablet (25 mg total) by mouth 3 (three) times daily as needed for anxiety. TAKE 1 TABLET BY MOUTH 3 TIMES DAILY AS NEEDED FOR ITCHING. (Patient taking differently: Take 25 mg by mouth 3 (three) times daily as needed for anxiety.) 90 tablet 1    ibuprofen (ADVIL) 800 MG tablet Take 1 tablet (800 mg total) by mouth every 8 (eight) hours as needed for moderate pain. 21  tablet 0    loratadine (CLARITIN) 10 MG tablet Take 10 mg by mouth daily.      medroxyPROGESTERone Acetate 150 MG/ML SUSY Inject 150 mg into the muscle every 3 (three) months.      valACYclovir (VALTREX) 1000 MG tablet Take 1 tablet (1,000 mg total) by mouth 2 (two) times daily. 20 tablet 5     Musculoskeletal: Strength & Muscle Tone: within normal limits Gait & Station: normal Patient leans: N/A  Psychiatric Specialty Exam:  Presentation  General Appearance:  Appropriate for Environment; Casual  Eye Contact: Good  Speech: Clear and Coherent; Normal Rate  Speech Volume: Normal  Handedness: Right  Mood and Affect  Mood: Anxious; Depressed (Tired)  Affect: Depressed; Congruent  Thought Process  Thought Processes: Coherent  Duration of  Psychotic Symptoms:N/A Past Diagnosis of Schizophrenia or Psychoactive disorder: No  Descriptions of Associations:Intact  Orientation:Full (Time, Place and Person)  Thought Content:Logical; WDL  Hallucinations:Hallucinations: None Description of Auditory Hallucinations: Last auditory hallucination was in February 2024.  At that time hearing voices the heart has self and hearing baby cry.  Ideas of Reference:None  Suicidal Thoughts:Suicidal Thoughts: No SI Passive Intent and/or Plan: -- (Denies)  Homicidal Thoughts:Homicidal Thoughts: No  Sensorium  Memory: Immediate Good; Recent Good  Judgment: Fair  Insight: Fair  Executive Functions  Concentration: Good  Attention Span: Good  Recall: [place] of Knowledge: Fair  Language: Good  Psychomotor Activity  Psychomotor Activity:  Assets  Assets: Communication Skills; Desire for Improvement; Housing; Physical Health; Resilience; Social Support  Sleep  Sleep: Sleep: Good Number of Hours of Sleep: 8  Physical Exam: Physical Exam Vitals and nursing note reviewed.  Constitutional:      Appearance: Normal appearance. She is obese.  HENT:     Head: Normocephalic.     Nose: Nose normal.     Mouth/Throat:     Mouth: Mucous membranes are moist.     Pharynx: Oropharynx is clear.  Eyes:     Conjunctiva/sclera: Conjunctivae normal.     Pupils: Pupils are equal, round, and reactive to light.  Cardiovascular:     Rate and Rhythm: Normal rate.     Pulses: Normal pulses.  Pulmonary:     Effort: Pulmonary effort is normal.  Abdominal:     Palpations: Abdomen is soft.  Genitourinary:    Comments: Deferred Musculoskeletal:        General: Normal range of motion.     Cervical back: Normal range of motion.  Skin:    General: Skin is warm.  Neurological:     General: No focal deficit present.     Mental Status: She is alert and oriented to person, place, and time.  Psychiatric:        Behavior: Behavior  normal.    Review of Systems  Constitutional: Negative.   HENT: Negative.    Eyes: Negative.   Respiratory: Negative.         Has history of asthma  Cardiovascular: Negative.   Gastrointestinal: Negative.   Genitourinary: Negative.   Musculoskeletal: Negative.   Skin: Negative.   Neurological:  Positive for seizures and loss of consciousness.  Endo/Heme/Allergies: Negative.        See allergy list  Psychiatric/Behavioral:  Positive for depression and substance abuse. The patient is nervous/anxious and has insomnia.    Blood pressure 120/80, pulse 83, temperature 98 F (36.7 C), temperature source Oral, resp. rate 18, height 5\' 7"  (1.702 m), weight 111.4 kg, last menstrual period  11/08/2022, SpO2 100 %, unknown if currently breastfeeding. Body mass index is 38.47 kg/m.  Treatment Plan Summary: Daily contact with patient to assess and evaluate symptoms and progress in treatment and Medication management  Observation Level/Precautions:  15 minute checks  Laboratory:  CBC Chemistry Profile HbAIC UDS UA  Psychotherapy: Therapeutic milieu  Medications: See MAR  Consultations: Social work  Discharge Concerns: Safety  Estimated LOS: 5 to 7 days  Other:  n/a   Labs:  CMP: Within normal limits  Lipid profile: LDL 101 high, HDL 37 low, otherwise normal  CBC with differential: MCV 77.1 low, otherwise normal  Pituitary related: Prolactin 14.4 normal.  Diabetes: Hemoglobin A1c 5.3, glucose 76.  All within normal limits  Thyroid: TSH 1.093, within normal limits  Viral test: Negative  UDS: Negative for substances  Urinalysis: Appearance hazy, Hgb urine dipstick large, leukocytes small protein 30  EKG: Normal sinus rhythm, ventricular rate 75, QTc/QTcB 388/433  Physician Treatment Plan for Primary Diagnosis: Severe recurrent major depression with psychotic features ([place])  Plan: --Discontinue Abilify tablet 5 mg p.o. 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 daily with breakfast for depression -- Resume medroxyprogesterone Depo-Provera injection IM 1 on three 2824 x 1 dose only  Other medical medications: -- Continue nicotine (NicoDerm CQ-dose in mg/24 hours patch 14 mg transdermal over 24 hours daily.  Agitation protocol: Olanzapine Zyprexa disintegrating tab 10 mg p.o. every 8 hours as needed agitation and Lorazepam Ativan tablet 1 mg p.o. as needed anxiety, severe agitation and Ziprasidone Geodon injection 20 mg IM as needed agitation  Other PRN Medications -Acetaminophen 650 mg every 6 as needed/mild pain -Maalox 30 mL oral every 4 as needed/digestion -Magnesium hydroxide 30 mL daily as needed/mild constipation --Initiate Zofran 4 mg p.o. every 8 hours as needed for nausea or vomiting  Safety and Monitoring: Voluntary admission to inpatient psychiatric unit for safety, stabilization and treatment Daily contact with patient to assess and evaluate symptoms and progress in treatment Patient's case to be discussed in multi-disciplinary team meeting Observation Level : q15 minute checks Vital signs: q12 hours Precautions: suicide, but pt currently verbally contracts for safety on unit    Discharge Planning: Social work and case management to assist with discharge planning and identification of hospital follow-up needs prior to discharge Estimated LOS: 5-7 days Discharge Concerns: Need to establish a safety plan; Medication compliance and effectiveness Discharge Goals: Return home with outpatient referrals for mental health follow-up including medication management/psychotherapy.  Long Term Goal(s): Improvement in symptoms so as ready for discharge  Short Term Goals: Ability to identify changes in lifestyle to reduce recurrence of condition will improve, Ability to verbalize feelings will improve, Ability to disclose and discuss suicidal ideas,  Ability to demonstrate self-control will improve, Ability to identify and develop effective coping behaviors will improve, Ability to maintain clinical measurements within normal limits will improve, Compliance with prescribed medications will improve, and Ability to identify triggers associated with substance abuse/mental health issues will improve  Physician Treatment Plan for Secondary Diagnosis: Principal Problem:   Severe recurrent major depression with psychotic features ([name])  I certify that inpatient services furnished can reasonably be expected to improve the patient's condition.    Laretta Bolster, FNP 3/27/202410:35 AM

## 2022-11-16 ENCOUNTER — Ambulatory Visit (HOSPITAL_COMMUNITY): Payer: Medicaid Other | Admitting: Mental Health

## 2022-11-16 ENCOUNTER — Ambulatory Visit (HOSPITAL_COMMUNITY): Payer: 59 | Admitting: Physician Assistant

## 2022-11-16 MED ORDER — ALBUTEROL SULFATE HFA 108 (90 BASE) MCG/ACT IN AERS
1.0000 | INHALATION_SPRAY | RESPIRATORY_TRACT | Status: DC | PRN
  Administered 2022-11-16 – 2022-11-17 (×2): 2 via RESPIRATORY_TRACT
  Administered 2022-11-18: 1 via RESPIRATORY_TRACT
  Administered 2022-11-20 – 2022-11-21 (×2): 2 via RESPIRATORY_TRACT
  Filled 2022-11-16 (×3): qty 6.7

## 2022-11-16 MED ORDER — VALACYCLOVIR HCL 500 MG PO TABS
1000.0000 mg | ORAL_TABLET | Freq: Two times a day (BID) | ORAL | Status: DC
  Administered 2022-11-16 – 2022-11-23 (×14): 1000 mg via ORAL
  Filled 2022-11-16 (×18): qty 2

## 2022-11-16 MED ORDER — BUSPIRONE HCL 5 MG PO TABS
5.0000 mg | ORAL_TABLET | Freq: Three times a day (TID) | ORAL | Status: DC
  Administered 2022-11-16 – 2022-11-23 (×19): 5 mg via ORAL
  Filled 2022-11-16 (×28): qty 1

## 2022-11-16 NOTE — Progress Notes (Addendum)
[facility] MD Progress Note  11/16/2022 9:29 AM EMBRY LINWOOD  MRN:  [id number]  Subjective: Melissa [name] states, "I feel like the walls are closing up on me this morning.  Not sure if it is my anxiety effect of medication."  Reason for admission:This is the second admission of this 22 year old African-American female to [facility]. 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, DMDD, GAD, history of postpartum depression, history of suicide attempts who presents to [facility] from [facility] for worsening depressive symptoms and progressive suicidal ideations ongoing for 2 months without specific plans, and not being safe if discharged to home   24-hour chart review:  Patient is compliant with scheduled meds except refusing nicotine gum for smoking cessation As needed medications of albuterol inhaler 2 puffs received for wheezing at 1211, hydroxyzine received at 1035 for anxiety, ondansetron received at 0947 for nausea Required Agitation PRNs: none Per RN notes, no documented behavioral issues and is attending group. Patient slept, 8 hours.    Today's assessment: Patient seen and examined in her room sitting on her bed. Chart reviewed and findings shared with the treatment team and discussed with the attending psychiatrist.  Mood appears less depressed with congruent affect.  However, patient is concerned and anxious about her children with issues involving the DSS.  Reports that she would be discharging to her grandmother and will be allowed visiting hours to her mother's house to see her children at specific times to be specified by DSS.  Patient reported anxiety and rated as #5 with 10 being the worst.  Added BuSpar 5 mg p.o. 3 times daily to her treatment plan.  Continues to take her medications as scheduled  except for nicotine gum for smoking cessation.  Denies any somatic discomfort from taking her medications.  Observe attending and participating in therapeutic milieu and group activities.  Reports good appetite and sleeping over 8 hours last night.  No delusional thinking or paranoia observed during this examination.  Denies SI, HI, or AVH.  Vital signs reviewed within normal limits.  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 Current/prior outpatient treatment: Yes, at [facility] Prior rehab hx: Denies Psychotherapy hx: Yes History of suicide: Yes History of homicide or aggression: Denies Psychiatric medication history: Yes, was prescribed Effexor, Abilify, hydroxyzine. Psychiatric medication compliance history: Yes, except during pregnancy from April 2023 to current Neuromodulation history: Denies Current Psychiatrist: Denies Current therapist: Denies  Past Medical History:  Past Medical History:  Diagnosis Date   Anemia    Anxiety    Asthma    inhaler. last attack June [id number]   Complication of anesthesia    Congenital hydronephrosis 2001   Constipation    Depression    Episodic tension-type headache, not intractable 04/16/2015   Family history of adverse reaction to anesthesia    mom rushed to hospital from Dentist office, pt her mother & grandmother have trouble waking fully after anesthesia   Genital herpes    Low back strain, sequela 11/10/2020  Migraine without aura and without status migrainosus, not intractable 04/16/2015    PID (pelvic inflammatory disease)    Post-partum depression 11/17/2021   Seizures ([place])    last one in 2015 - on meds   Sickle cell trait ([place])    TBI (traumatic brain injury) ([place]) 2006    Past Surgical History:  Procedure Laterality Date   APPENDECTOMY     CESAREAN SECTION N/A 08/31/2021   Procedure: CESAREAN SECTION;  Surgeon: Gwynne Edinger, MD;  Location: [facility] LD ORS;  Service: Obstetrics;  Laterality: N/A;   CESAREAN SECTION N/A 06/05/2022   Procedure: CESAREAN SECTION;  Surgeon: Chancy Milroy, MD;  Location: [facility] LD ORS;  Service: Obstetrics;  Laterality: N/A;   HERNIA REPAIR     INTESTINAL MALROTATION REPAIR  2001   Family History:  Family History  Problem Relation Age of Onset   Depression Mother    Stroke Mother    Obesity Mother    Post-traumatic stress disorder Mother    Anxiety disorder Mother    Hypertension Mother    Diabetes Mother    Schizophrenia Father    Kidney disease Father    Family Psychiatric  History: Psych: Father has history of schizophrenia Psych Rx: Yes SA/HA: Denies Substance use family hx: Patient denies   Social History:  Social History   Substance and Sexual Activity  Alcohol Use Not Currently   Comment: occasionally, prior to pregnancy     Social History   Substance and Sexual Activity  Drug Use Not Currently   Types: Marijuana   Comment: last 2020    Social History   Socioeconomic History   Marital status: Single    Spouse name: Not on file   Number of children: Not on file   Years of education: Not on file   Highest education level: Not on file  Occupational History   Not on file  Tobacco Use   Smoking status: Every Day    Packs/day: .25    Types: Cigarettes   Smokeless tobacco: Never   Tobacco comments:    black and milds, 2 cigs daily  Vaping Use   Vaping Use: Former  Substance and Sexual Activity   Alcohol use: Not Currently    Comment: occasionally, prior to pregnancy   Drug use: Not Currently    Types:  Marijuana    Comment: last 2020   Sexual activity: Not Currently    Partners: Male    Birth control/protection: None  Other Topics Concern   Not on file  Social History Narrative   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   She attended [employer].    She lives with her parents, siblings, and grandfather.    She enjoys writing, singing, dancing, cooking, and shopping.   She attends [employer].   Social Determinants of Health   Financial Resource Strain: Not on file  Food Insecurity: Food Insecurity Present (11/14/2022)   Hunger Vital Sign    Worried About Running Out of Food in the Last Year: Sometimes true    Ran Out of Food in the Last Year: Sometimes true  Transportation Needs: No Transportation Needs (11/14/2022)   PRAPARE - Hydrologist (Medical): No    Lack of Transportation (Non-Medical): No  Recent Concern: Transportation Needs - Unmet Transportation Needs (11/14/2022)   PRAPARE - Hydrologist (Medical): Yes    Lack of Transportation (Non-Medical): Yes  Physical Activity: Not on file  Stress: Not on file  Social Connections: Not on file   Additional Social History:     Sleep: Good  Appetite:  Good  Current Medications: Current Facility-Administered Medications  Medication Dose Route Frequency Provider Last Rate Last Admin   acetaminophen (TYLENOL) tablet 650 mg  650 mg Oral Q6H PRN Scot Jun, NP       alum & mag hydroxide-simeth (MAALOX/MYLANTA) 200-200-20 MG/5ML suspension 30 mL  30 mL Oral Q4H PRN Scot Jun, NP       hydrOXYzine (ATARAX) tablet 25 mg  25 mg Oral TID PRN Scot Jun, NP   25 mg at 11/15/22 1826   OLANZapine zydis (ZYPREXA) disintegrating tablet 10 mg  10 mg Oral Q8H PRN Scot Jun, NP       And   LORazepam (ATIVAN) tablet 1 mg  1 mg Oral PRN Scot Jun, NP       And   ziprasidone (GEODON) injection 20 mg  20 mg Intramuscular PRN Scot Jun, NP       magnesium hydroxide (MILK OF MAGNESIA) suspension 30 mL  30 mL Oral Daily PRN Scot Jun, NP       medroxyPROGESTERone (DEPO-PROVERA) injection 150 mg  150 mg Intramuscular Once Massengill, Ovid Curd, MD       nicotine (NICODERM CQ - dosed in mg/24 hours) patch 14 mg  14 mg Transdermal Daily Evette Georges, NP       ondansetron [facility]) tablet 4 mg  4 mg Oral Q8H PRN Massengill, Ovid Curd, MD   4 mg at 11/15/22 1826   venlafaxine XR (EFFEXOR-XR) 24 hr capsule 75 mg  75 mg Oral Q breakfast Scot Jun, NP   75 mg at 11/15/22 [id number]    Lab Results:  Results for orders placed or performed during the hospital encounter of 11/14/22 (from the past 48 hour(s))  Glucose, capillary     Status: Abnormal   Collection Time: 11/15/22 10:09 PM  Result Value Ref Range   Glucose-Capillary 138 (H) 70 - 99 mg/dL    Comment: Glucose reference range applies only to samples taken after fasting for at least 8 hours.    Blood Alcohol level:  Lab Results  Component Value Date   ETH <10 11/14/2022   ETH <10 [id number]    Metabolic Disorder Labs: Lab Results  Component Value Date   HGBA1C 5.3 11/14/2022   MPG 105 11/14/2022   MPG 93.93 11/19/2021   Lab Results  Component Value Date   PROLACTIN 14.4 11/14/2022   PROLACTIN 8.8 01/01/2017   Lab Results  Component Value Date   CHOL 153 11/14/2022   TRIG 75 11/14/2022   HDL 37 (L) 11/14/2022   CHOLHDL 4.1 11/14/2022   VLDL 15 11/14/2022   LDLCALC 101 (H) 11/14/2022   LDLCALC 105 (H) 11/19/2021    Physical Findings: AIMS:  , ,  ,  ,    CIWA:    COWS:     Musculoskeletal: Strength & Muscle Tone: within normal limits Gait & Station: normal Patient leans: N/A  Psychiatric Specialty Exam:  Presentation  General Appearance:  Casual; Fairly Groomed  Eye Contact: Good  Speech: Clear and Coherent; Normal Rate  Speech Volume: Normal  Handedness: Right  Mood and Affect  Mood: Anxious; Depressed;  Hopeless  Affect: Congruent  Thought Process  Thought Processes: Coherent  Descriptions of Associations:Intact  Orientation:Full (Time, Place and Person)  Thought Content:Logical  History of Schizophrenia/Schizoaffective disorder:No  Duration of Psychotic Symptoms:No  data recorded Hallucinations:Hallucinations: None Description of Auditory Hallucinations: denies  Ideas of Reference:None  Suicidal Thoughts:Suicidal Thoughts: No SI Passive Intent and/or Plan: -- (denies)  Homicidal Thoughts:Homicidal Thoughts: No  Sensorium  Memory: Immediate Good; Recent Good  Judgment: Fair  Insight: Fair  Community education officer  Concentration: Good  Attention Span: Good  Recall: [place] of Knowledge: Fair  Language: Good  Psychomotor Activity  Psychomotor Activity: Psychomotor Activity: Normal  Assets  Assets: Communication Skills; Desire for Improvement; Housing; Physical Health; Resilience  Sleep  Sleep: Sleep: Good Number of Hours of Sleep: 8  Physical Exam: Physical Exam Vitals and nursing note reviewed.  HENT:     Head: Normocephalic.     Nose: Nose normal.     Mouth/Throat:     Mouth: Mucous membranes are moist.     Pharynx: Oropharynx is clear.  Eyes:     Conjunctiva/sclera: Conjunctivae normal.     Pupils: Pupils are equal, round, and reactive to light.  Cardiovascular:     Rate and Rhythm: Normal rate.     Pulses: Normal pulses.  Pulmonary:     Effort: Pulmonary effort is normal.  Abdominal:     Palpations: Abdomen is soft.  Genitourinary:    Comments: Deferred Musculoskeletal:        General: Normal range of motion.     Cervical back: Normal range of motion.  Skin:    General: Skin is warm.  Neurological:     General: No focal deficit present.     Mental Status: She is alert and oriented to person, place, and time.  Psychiatric:        Behavior: Behavior normal.    Review of Systems  Constitutional: Negative.   HENT:  Negative.    Respiratory: Negative.    Cardiovascular: Negative.   Gastrointestinal: Negative.   Genitourinary: Negative.   Musculoskeletal: Negative.   Skin: Negative.   Neurological:  Positive for seizures and loss of consciousness.  Endo/Heme/Allergies: Negative.   Psychiatric/Behavioral:  Positive for depression. The patient is nervous/anxious and has insomnia.    Blood pressure 118/79, pulse 93, temperature 98.9 F (37.2 C), temperature source Oral, resp. rate 18, height 5\' 7"  (1.702 m), weight 111.4 kg, last menstrual period 11/08/2022, SpO2 100 %, unknown if currently breastfeeding. Body mass index is 38.47 kg/m.  Treatment Plan Summary: Daily contact with patient to assess and evaluate symptoms and progress in treatment and Medication management  Plan: --Discontinue Abilify tablet 5 mg p.o. 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 daily with breakfast for depression --Initiate BuSpar 5 mg p.o. 3 times daily for anxiety 11/16/2022 -- Resume medroxyprogesterone Depo-Provera injection IM 1 3 /28/24 x 1 dose only   Other medical medications: -- Continue nicotine (NicoDerm CQ-dose in mg/24 hours patch 14 mg transdermal over 24 hours daily.   Agitation protocol: Olanzapine Zyprexa disintegrating tab 10 mg p.o. every 8 hours as needed agitation and Lorazepam Ativan tablet 1 mg p.o. as needed anxiety, severe agitation and Ziprasidone Geodon injection 20 mg IM as needed agitation   Other PRN Medications -Acetaminophen 650 mg every 6 as needed/mild pain -Maalox 30 mL oral every 4 as needed/digestion -Magnesium hydroxide 30 mL daily as needed/mild constipation -- Continue Zofran 4 mg p.o. every 8 hours as needed for nausea or vomiting   Safety and Monitoring: Voluntary admission to inpatient psychiatric unit for safety, stabilization and treatment Daily contact with patient to  assess and evaluate symptoms and progress in treatment Patient's case to be discussed in multi-disciplinary team meeting Observation Level : q15 minute checks Vital signs: q12 hours Precautions: suicide, but pt currently verbally contracts for safety on unit    Discharge Planning: Social work and case management to assist with discharge planning and identification of hospital follow-up needs prior to discharge Estimated LOS: 5-7 days Discharge Concerns: Need to establish a safety plan; Medication compliance and effectiveness Discharge Goals: Return home with outpatient referrals for mental health follow-up including medication management/psychotherapy.   Long Term Goal(s): Improvement in symptoms so as ready for discharge   Short Term Goals: Ability to identify changes in lifestyle to reduce recurrence of condition will improve, Ability to verbalize feelings will improve, Ability to disclose and discuss suicidal ideas, Ability to demonstrate self-control will improve, Ability to identify and develop effective coping behaviors will improve, Ability to maintain clinical measurements within normal limits will improve, Compliance with prescribed medications will improve, and Ability to identify triggers associated with substance abuse/mental health issues will improve   Physician Treatment Plan for Secondary Diagnosis: Principal Problem:   Severe recurrent major depression with psychotic features ([place])   I certify that inpatient services furnished can reasonably be expected to improve the patient's condition.      Laretta Bolster, FNP 11/16/2022, 9:29 AM

## 2022-11-16 NOTE — Group Note (Unsigned)
Date:  11/16/2022 Time:  9:37 AM  Group Topic/Focus:  Orientation:   The focus of this group is to educate the patient on the purpose and policies of crisis stabilization and provide a format to answer questions about their admission.  The group details unit policies and expectations of patients while admitted.     Participation Level:  {BHH PARTICIPATION HD:[id number]  Participation Quality:  {BHH PARTICIPATION QUALITY:22265}  Affect:  {BHH AFFECT:22266}  Cognitive:  {BHH COGNITIVE:22267}  Insight: {BHH Insight2:20797}  Engagement in Group:  {BHH ENGAGEMENT IN JY:[id number]  Modes of Intervention:  {BHH MODES OF INTERVENTION:22269}  Additional Comments:  ***  Jerrye Beavers 11/16/2022, 9:37 AM

## 2022-11-16 NOTE — BHH Suicide Risk Assessment (Deleted)
[place] INPATIENT:  Family/Significant Other Suicide Prevention Education  Suicide Prevention Education:  Education Completed; 11-16-2022, Georgana Curio (Mother) [phone] has been identified by the patient as Georgana Curio (Mother) [phone] with whom the patient will be residing, and identified as the person(s) who will aid the patient in the event of a mental health crisis (suicidal ideations/suicide attempt).  With written consent from the patient, the family member/significant other has been provided the following suicide prevention education, prior to the and/or following the discharge of the patient.  The suicide prevention education provided includes the following: Suicide risk factors Suicide prevention and interventions National Suicide Hotline telephone number [facility] assessment telephone number [facility] Emergency Assistance [place] and/or Residential Mobile Crisis Unit telephone number  Request made of family/significant other to: Remove weapons (e.g., guns, rifles, knives), all items previously/currently identified as safety concern.   Remove drugs/medications (over-the-counter, prescriptions, illicit drugs), all items previously/currently identified as a safety concern.  Georgana Curio (Mother) [phone] verbalizes understanding of the suicide prevention education information provided. Georgana Curio (Mother) [phone] agrees to remove the items of safety concern listed above.  Catalena Stanhope S Arissa Fagin 11/16/2022, 3:12 PM

## 2022-11-16 NOTE — Progress Notes (Signed)
   11/16/22 2114  Psych Admission Type (Psych Patients Only)  Admission Status Voluntary  Psychosocial Assessment  Patient Complaints Anxiety;Depression;Worrying  Eye Contact Fair  Facial Expression Flat;Sad;Worried  Affect Anxious;Sad  Speech Logical/coherent  Interaction Assertive  Motor Activity Other (Comment) (WDL)  Appearance/Hygiene Unremarkable  Behavior Characteristics Cooperative;Anxious  Mood Pleasant;Depressed;Anxious  Thought Process  Coherency WDL  Content WDL  Delusions None reported or observed  Perception WDL  Hallucination None reported or observed  Judgment Limited  Confusion None  Danger to Self  Current suicidal ideation? Denies  Self-Injurious Behavior No self-injurious ideation or behavior indicators observed or expressed   Agreement Not to Harm Self Yes  Description of Agreement verbal  Danger to Others  Danger to Others None reported or observed

## 2022-11-16 NOTE — BHH Group Notes (Signed)
PsychoEducational Group Patients were asked to reflect on how to promote wellbeing with healthy lifestyle choices. Education regarding healthy sleep habits, and lifestyle impact mental health. Conversely patients were then asked to identify warning signs of when they are not well and how to integrate healthy coping skills/lifestyle to help.  Patient attended late and did not participate.

## 2022-11-16 NOTE — Progress Notes (Signed)
    11/15/22 2213  Psych Admission Type (Psych Patients Only)  Admission Status Voluntary  Psychosocial Assessment  Patient Complaints Anxiety;Depression  Eye Contact Fair  Facial Expression Flat;Sad;Worried  Affect Anxious;Sad  Speech Logical/coherent  Interaction Assertive  Motor Activity Other (Comment) (WDL)  Appearance/Hygiene Unremarkable  Behavior Characteristics Cooperative;Anxious  Mood Depressed;Anxious  Thought Process  Coherency WDL  Content WDL  Delusions None reported or observed  Perception WDL  Hallucination None reported or observed  Judgment Limited  Confusion None  Danger to Self  Current suicidal ideation? Denies  Self-Injurious Behavior No self-injurious ideation or behavior indicators observed or expressed   Agreement Not to Harm Self Yes  Description of Agreement verbal  Danger to Others  Danger to Others None reported or observed

## 2022-11-16 NOTE — BHH Counselor (Signed)
CSW spoke with pt's mother,who states that the pt can only see her children under supervision during the hours of 8am-10pm, pt is not allowed to be alone with the children under NO circumstance, not even a bathroom break as per, DSS. The case will be open for 45 days. Melissa [name]'s mother is hoping that she will not have to go to the [facility] because she cannot return to the home the case has been closed.

## 2022-11-16 NOTE — Plan of Care (Signed)
  Problem: Coping: Goal: Ability to demonstrate self-control will improve Outcome: Progressing   Problem: Education: Goal: Knowledge of the prescribed therapeutic regimen will improve Outcome: Progressing

## 2022-11-16 NOTE — Progress Notes (Signed)
   11/16/22 0534  15 Minute Checks  Location Bedroom  Visual Appearance Calm  Behavior Sleeping  Sleep (Behavioral Health Patients Only)  Calculate sleep? (Click Yes once per 24 hr at 0600 safety check) Yes  Documented sleep last 24 hours 10.25

## 2022-11-16 NOTE — Plan of Care (Signed)
  Problem: Activity: Goal: Interest or engagement in activities will improve Outcome: Progressing Goal: Sleeping patterns will improve Outcome: Progressing   Problem: Coping: Goal: Will verbalize feelings Outcome: Progressing

## 2022-11-16 NOTE — Progress Notes (Signed)
   11/16/22 1200  Psych Admission Type (Psych Patients Only)  Admission Status Voluntary  Psychosocial Assessment  Patient Complaints Anxiety;Depression  Eye Contact Fair  Facial Expression Flat;Sad;Worried  Affect Anxious;Sad  Speech Logical/coherent  Interaction Assertive  Motor Activity Slow  Appearance/Hygiene Unremarkable  Behavior Characteristics Cooperative;Anxious  Mood Depressed;Anxious;Pleasant  Thought Process  Coherency WDL  Content WDL  Delusions None reported or observed  Perception WDL  Hallucination None reported or observed  Judgment Limited  Confusion None  Danger to Self  Current suicidal ideation? Denies  Self-Injurious Behavior No self-injurious ideation or behavior indicators observed or expressed   Agreement Not to Harm Self Yes  Description of Agreement Verbal

## 2022-11-16 NOTE — BHH Group Notes (Signed)
[place] Group Notes:  (Nursing/MHT/Case Management/Adjunct)  Date:  11/16/2022  Time:  8:43 PM  Type of Therapy:  Group Therapy  Participation Level:  Minimal  Participation Quality:  Appropriate  Affect:  Appropriate  Cognitive:  Appropriate  Insight:  Limited  Engagement in Group:  None  Modes of Intervention:  Discussion  Summary of Progress/Problems:  Melissa [name] 11/16/2022, 8:43 PM

## 2022-11-16 NOTE — BHH Suicide Risk Assessment (Signed)
Melissa [name] INPATIENT:  Family/Significant Other Suicide Prevention Education  Suicide Prevention Education:  Family/Significant Other Refusal to Support Patient after Discharge:  Suicide Prevention Education Not Provided:  Patient has identified home of family/significant other as the place the patient will be residing after discharge.  With written consent of the patient, two attempts were made to provide Suicide Prevention Education to 11-16-2022 [name] (Mother) [phone], This person indicates he/she will not be responsible for the patient after discharge.  [name] [name] 11/16/2022,3:27 PM

## 2022-11-17 MED ORDER — VENLAFAXINE HCL ER 37.5 MG PO CP24
112.5000 mg | ORAL_CAPSULE | Freq: Every day | ORAL | Status: AC
  Administered 2022-11-18: 112.5 mg via ORAL
  Filled 2022-11-17: qty 3

## 2022-11-17 MED ORDER — VENLAFAXINE HCL ER 150 MG PO CP24
150.0000 mg | ORAL_CAPSULE | Freq: Every day | ORAL | Status: DC
  Filled 2022-11-17 (×4): qty 1

## 2022-11-17 NOTE — BHH Group Notes (Signed)
[place] Group Notes:  (Nursing/MHT/Case Management/Adjunct)  Date:  11/17/2022  Time:  8:21 PM  Type of Therapy:   AA  Participation Level:  Active  Participation Quality:  Appropriate  Affect:  Appropriate  Cognitive:  Appropriate  Insight:  Appropriate  Engagement in Group:  Engaged  Modes of Intervention:  Education  Summary of Progress/Problems: Attended AA meeting.  [name] 11/17/2022, 8:21 PM

## 2022-11-17 NOTE — Group Note (Signed)
Recreation Therapy Group Note   Group Topic:Stress Management  Group Date: 11/17/2022 Start Time: 0930 End Time: 0955 Facilitators: Samuele Storey-McCall, LRT,CTRS Location: [address] Dayroom   Goal Area(s) Addresses:  Patient will identify positive stress management techniques. Patient will identify benefits of using stress management post d/c.  Group Description:  Meditation.  LRT explained to patients what group would consist of, they should focus on their breathing and be in a comfortable position.  LRT proceeded to play a meditation called 'Stop Holding Yourself Back'.  The meditation wanted patients to identify habits they that have prevented them from the life they want for themselves.  It also encouraged to hold onto the positive image they have of their future selves and to working to get to that point.   Affect/Mood: Flat   Participation Level: None   Participation Quality: None   Behavior: Appropriate   Speech/Thought Process: N/A   Insight: N/A   Judgement: N/A   Modes of Intervention: App   Patient Response to Interventions:  N/A   Education Outcome:  In group clarification offered    Clinical Observations/Individualized Feedback: Pt came in right before group wrapped up.    Plan: Continue to engage patient in RT group sessions 2-3x/week.   Kiari Hosmer-McCall, LRT,CTRS  11/17/2022 11:55 AM

## 2022-11-17 NOTE — Progress Notes (Signed)
   11/17/22 0532  15 Minute Checks  Location Bedroom  Visual Appearance Calm  Behavior Sleeping  Sleep (Behavioral Health Patients Only)  Calculate sleep? (Click Yes once per 24 hr at 0600 safety check) Yes  Documented sleep last 24 hours 11.5

## 2022-11-17 NOTE — Progress Notes (Signed)
[facility] MD Progress Note  11/17/2022 3:12 PM Melissa [name]  MRN:  [id number]  Subjective: Melissa [name] states, "I feel like the walls are closing up on me this morning.  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 This is her second [facility] admission.   24-hour chart review: Patient is noted to be medication compliant PRN Albuterol, Hydroxyzine, received. No behavioral issues noted; nurse stated patient became increasingly guarded when asked about having any thoughts of wanting to harm her baby overnight and didn't answer. Staff report patient is intermittently visible in milieu attending unit groups and activities. Slept 11.5 overnight.     Today's assessment: Patient initially seen and examined in her room sitting on her bed with MD Massengill where patient reports improved mood. She reports plan to be discharged to her grandmother's home where she will be allowed visiting hours to her mother's house to see her children at specified times per DSS recommendations.  She was reassessed outside during recreation time by this provider alone where she presents alert and oriented. Calm and cooperative. Casual appearance. Euthymic mood, congruent affect. She reports stopped taking Effexor in May after learning she was pregnant. Says she became overwhelmed and began feeling suicidal at home. She describes feeling overwhelmed living in a home with her 3 kids (12 y.o, 79 y.o, and 5  months), 5 siblings, both parents, and grandfather; says her son suffered a brain bleed from 'traumatic birth' that has resulted in x3 brain surgeries at [facility] all resulting in increased stress further worsening her post-partum depression. Identifies her parents and 'main supports'; no assistance from her children's fathers. She endorses feeling much better now that she has confirmed being able to go to grandmother's home. She denies any thoughts of wanting to harm herself or baby in the past 24 hours. Further denies any HI/AVH. Describes appetite and sleep as 'good'. No delusional thinking or paranoia noted. Interacting with peers and staff appropriately. Vital signs reviewed within normal limits.  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 Current/prior outpatient treatment: Yes, at [facility] Prior rehab hx: Denies Psychotherapy hx: Yes History of suicide: Yes History of homicide or aggression: Denies Psychiatric medication history: Yes, was prescribed Effexor, Abilify, hydroxyzine. Psychiatric medication compliance history: Yes, except during pregnancy from April 2023 to current Neuromodulation history: Denies Current Psychiatrist: Denies Current therapist: Denies  Past Medical History:  Past Medical History:  Diagnosis Date   Anemia    Anxiety    Asthma    inhaler. last attack June [id number]   Complication of anesthesia    Congenital hydronephrosis 2001   Constipation    Depression    Episodic tension-type headache, not intractable 04/16/2015  Family history of adverse reaction to anesthesia    mom rushed to hospital from Dentist office, pt her mother & grandmother have trouble waking fully after anesthesia   Genital herpes    Low back strain, sequela 11/10/2020   Migraine without aura and without status migrainosus, not intractable 04/16/2015   PID (pelvic inflammatory disease)    Post-partum depression 11/17/2021   Seizures ([place])    last one in 2015 - on meds   Sickle cell trait ([place])    TBI (traumatic brain injury) ([place]) 2006    Past Surgical History:  Procedure Laterality Date   APPENDECTOMY     CESAREAN SECTION N/A 08/31/2021   Procedure: CESAREAN SECTION;  Surgeon: Gwynne Edinger, MD;  Location: [facility] LD ORS;  Service: Obstetrics;  Laterality: N/A;   CESAREAN SECTION N/A 06/05/2022   Procedure: CESAREAN SECTION;  Surgeon: Chancy Milroy, MD;  Location: [facility] LD ORS;  Service: Obstetrics;  Laterality: N/A;   HERNIA REPAIR     INTESTINAL MALROTATION REPAIR  2001   Family History:  Family History  Problem Relation Age of Onset   Depression Mother    Stroke Mother    Obesity Mother    Post-traumatic stress disorder Mother    Anxiety disorder Mother    Hypertension Mother    Diabetes Mother    Schizophrenia Father    Kidney disease Father    Family Psychiatric  History: Psych: Father has history of schizophrenia Psych Rx: Yes SA/HA: Denies Substance use family hx: Patient denies   Social History:  Social History   Substance and Sexual Activity  Alcohol Use Not Currently   Comment: occasionally, prior to pregnancy     Social History   Substance and Sexual Activity  Drug Use Not Currently   Types: Marijuana   Comment: last 2020    Social History   Socioeconomic History   Marital status: Single    Spouse name: Not on file   Number of children: Not on file   Years of education: Not on file   Highest education level: Not on file  Occupational History   Not on file  Tobacco Use   Smoking status:  Every Day    Packs/day: .25    Types: Cigarettes   Smokeless tobacco: Never   Tobacco comments:    black and milds, 2 cigs daily  Vaping Use   Vaping Use: Former  Substance and Sexual Activity   Alcohol use: Not Currently    Comment: occasionally, prior to pregnancy   Drug use: Not Currently    Types: Marijuana    Comment: last 2020   Sexual activity: Not Currently    Partners: Male    Birth control/protection: None  Other Topics Concern   Not on file  Social History Narrative   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   She attended [employer].    She lives with her parents, siblings, and grandfather.    She enjoys writing, singing, dancing, cooking, and shopping.   She attends [employer].   Social Determinants of Health   Financial Resource Strain: Not on file  Food Insecurity: Food Insecurity Present (11/14/2022)   Hunger Vital Sign    Worried About Running Out of Food in the Last Year: Sometimes true    Ran Out of Food in the Last Year: Sometimes true  Transportation Needs: No Transportation Needs (11/14/2022)   PRAPARE - Transportation    Lack of Transportation (Medical): No    Lack  of Transportation (Non-Medical): No  Recent Concern: Transportation Needs - Unmet Transportation Needs (11/14/2022)   PRAPARE - Hydrologist (Medical): Yes    Lack of Transportation (Non-Medical): Yes  Physical Activity: Not on file  Stress: Not on file  Social Connections: Not on file   Additional Social History:     Sleep: Good  Appetite:  Good  Current Medications: Current Facility-Administered Medications  Medication Dose Route Frequency Provider Last Rate Last Admin   acetaminophen (TYLENOL) tablet 650 mg  650 mg Oral Q6H PRN Scot Jun, NP   650 mg at 11/16/22 2114   albuterol (VENTOLIN HFA) 108 (90 Base) MCG/ACT inhaler 1-2 puff  1-2 puff Inhalation Q4H PRN Massengill, Ovid Curd, MD   2 puff at 11/17/22 1504   alum & mag  hydroxide-simeth (MAALOX/MYLANTA) 200-200-20 MG/5ML suspension 30 mL  30 mL Oral Q4H PRN Scot Jun, NP       busPIRone (BUSPAR) tablet 5 mg  5 mg Oral TID Laretta Bolster, FNP   5 mg at 11/17/22 1134   hydrOXYzine (ATARAX) tablet 25 mg  25 mg Oral TID PRN Scot Jun, NP   25 mg at 11/17/22 0752   OLANZapine zydis (ZYPREXA) disintegrating tablet 10 mg  10 mg Oral Q8H PRN Scot Jun, NP       And   LORazepam (ATIVAN) tablet 1 mg  1 mg Oral PRN Scot Jun, NP       And   ziprasidone (GEODON) injection 20 mg  20 mg Intramuscular PRN Scot Jun, NP       magnesium hydroxide (MILK OF MAGNESIA) suspension 30 mL  30 mL Oral Daily PRN Scot Jun, NP       nicotine (NICODERM CQ - dosed in mg/24 hours) patch 14 mg  14 mg Transdermal Daily Evette Georges, NP       ondansetron [facility]) tablet 4 mg  4 mg Oral Q8H PRN Janine Limbo, MD   4 mg at 11/16/22 [id number]   valACYclovir (VALTREX) tablet 1,000 mg  1,000 mg Oral BID Janine Limbo, MD   1,000 mg at 11/17/22 0751   [START ON 11/18/2022] venlafaxine XR (EFFEXOR-XR) 24 hr capsule 112.5 mg  112.5 mg Oral Q breakfast Leevy-Johnson, Leonie Amacher A, NP       Followed by   Derrill Memo ON 11/19/2022] venlafaxine XR (EFFEXOR-XR) 24 hr capsule 150 mg  150 mg Oral Q breakfast Leevy-Johnson, Pancho Rushing A, NP        Lab Results:  Results for orders placed or performed during the hospital encounter of 11/14/22 (from the past 48 hour(s))  Glucose, capillary     Status: Abnormal   Collection Time: 11/15/22 10:09 PM  Result Value Ref Range   Glucose-Capillary 138 (H) 70 - 99 mg/dL    Comment: Glucose reference range applies only to samples taken after fasting for at least 8 hours.    Blood Alcohol level:  Lab Results  Component Value Date   ETH <10 11/14/2022   ETH <10 [id number]    Metabolic Disorder Labs: Lab Results  Component Value Date   HGBA1C 5.3 11/14/2022   MPG 105 11/14/2022   MPG 93.93 11/19/2021   Lab  Results  Component Value Date   PROLACTIN 14.4 11/14/2022   PROLACTIN 8.8 01/01/2017   Lab Results  Component Value Date   CHOL 153 11/14/2022   TRIG 75 11/14/2022   HDL 37 (L) 11/14/2022   CHOLHDL 4.1 11/14/2022  VLDL 15 11/14/2022   LDLCALC 101 (H) 11/14/2022   LDLCALC 105 (H) 11/19/2021    Physical Findings: AIMS:  , ,  ,  ,    CIWA:    COWS:     Musculoskeletal: Strength & Muscle Tone: within normal limits Gait & Station: normal Patient leans: N/A  Psychiatric Specialty Exam:  Presentation  General Appearance:  Casual; Fairly Groomed  Eye Contact: Good  Speech: Clear and Coherent; Normal Rate  Speech Volume: Normal  Handedness: Right  Mood and Affect  Mood: Anxious; Depressed; Hopeless  Affect: Congruent  Thought Process  Thought Processes: Coherent  Descriptions of Associations:Intact  Orientation:Full (Time, Place and Person)  Thought Content:Logical  History of Schizophrenia/Schizoaffective disorder:No  Duration of Psychotic Symptoms:No data recorded Hallucinations:Hallucinations: None Description of Auditory Hallucinations: denies  Ideas of Reference:None  Suicidal Thoughts:Suicidal Thoughts: No SI Passive Intent and/or Plan: -- (denies)  Homicidal Thoughts:Homicidal Thoughts: No  Sensorium  Memory: Immediate Good; Recent Good  Judgment: Fair  Insight: Fair  Community education officer  Concentration: Good  Attention Span: Good  Recall: [place] of Knowledge: Fair  Language: Good  Psychomotor Activity  Psychomotor Activity: Psychomotor Activity: Normal  Assets  Assets: Communication Skills; Desire for Improvement; Housing; Physical Health; Resilience  Sleep  Sleep: Sleep: Good Number of Hours of Sleep: 8  Physical Exam: Physical Exam Vitals and nursing note reviewed.  HENT:     Head: Normocephalic.     Nose: Nose normal.     Mouth/Throat:     Mouth: Mucous membranes are moist.     Pharynx:  Oropharynx is clear.  Eyes:     Conjunctiva/sclera: Conjunctivae normal.     Pupils: Pupils are equal, round, and reactive to light.  Cardiovascular:     Rate and Rhythm: Normal rate.     Pulses: Normal pulses.  Pulmonary:     Effort: Pulmonary effort is normal.  Abdominal:     Palpations: Abdomen is soft.  Genitourinary:    Comments: Deferred Musculoskeletal:        General: Normal range of motion.     Cervical back: Normal range of motion.  Skin:    General: Skin is warm.  Neurological:     General: No focal deficit present.     Mental Status: She is alert and oriented to person, place, and time.  Psychiatric:        Behavior: Behavior normal.    Review of Systems  Constitutional: Negative.   HENT: Negative.    Respiratory: Negative.    Cardiovascular: Negative.   Gastrointestinal: Negative.   Genitourinary: Negative.   Musculoskeletal: Negative.   Skin: Negative.   Neurological:  Positive for seizures and loss of consciousness.  Endo/Heme/Allergies: Negative.   Psychiatric/Behavioral:  Positive for depression. Negative for suicidal ideas. The patient is nervous/anxious and has insomnia.    Blood pressure (!) 118/96, pulse 73, temperature 97.7 F (36.5 C), temperature source Oral, resp. rate 18, height 5\' 7"  (1.702 m), weight 111.4 kg, last menstrual period 11/08/2022, SpO2 100 %, unknown if currently breastfeeding. Body mass index is 38.47 kg/m.  Treatment Plan Summary: Daily contact with patient to assess and evaluate symptoms and progress in treatment and Medication management  Recent medication trials:  Abilify tablet 5 mg p.o. for mood stabilization 11/15/2022 discontinued due to effects on pathological gambling  Plan: Increase: Venlafaxine XR (Effexor-XR ) 24-hour capsule 112.5 milligram p.o. daily Saturday 11/18/22 with breakfast for depression; then increase to 150 mg Sunday 11/19/22 for depression  Continue:  BuSpar 5 mg p.o. 3 times daily for anxiety  11/16/2022 Medroxyprogesterone Depo-Provera injection IM 1 3 /28/24 x 1 dose only   Other medical medications: -- Continue nicotine (NicoDerm CQ-dose in mg/24 hours patch 14 mg transdermal over 24 hours daily.   Agitation protocol: Olanzapine Zyprexa disintegrating tab 10 mg p.o. every 8 hours as needed agitation and Lorazepam Ativan tablet 1 mg p.o. as needed anxiety, severe agitation and Ziprasidone Geodon injection 20 mg IM as needed agitation   Other PRN Medications -Acetaminophen 650 mg every 6 as needed/mild pain -Maalox 30 mL oral every 4 as needed/digestion -Magnesium hydroxide 30 mL daily as needed/mild constipation -- Continue Zofran 4 mg p.o. every 8 hours as needed for nausea or vomiting   Safety and Monitoring: Voluntary admission to inpatient psychiatric unit for safety, stabilization and treatment Daily contact with patient to assess and evaluate symptoms and progress in treatment Patient's case to be discussed in multi-disciplinary team meeting Observation Level : q15 minute checks Vital signs: q12 hours Precautions: suicide, but pt currently verbally contracts for safety on unit    Discharge Planning: Social work and case management to assist with discharge planning and identification of hospital follow-up needs prior to discharge; Active CPS case.  Estimated LOS: 5-7 days Discharge Concerns: Need to establish a safety plan; Medication compliance and effectiveness Discharge Goals: Return home with outpatient referrals for mental health follow-up including medication management/psychotherapy.   Long Term Goal(s): Improvement in symptoms so as ready for discharge   Short Term Goals: Ability to identify changes in lifestyle to reduce recurrence of condition will improve, Ability to verbalize feelings will improve, Ability to disclose and discuss suicidal ideas, Ability to demonstrate self-control will improve, Ability to identify and develop effective coping behaviors  will improve, Ability to maintain clinical measurements within normal limits will improve, Compliance with prescribed medications will improve, and Ability to identify triggers associated with substance abuse/mental health issues will improve   Physician Treatment Plan for Secondary Diagnosis: Principal Problem:   Severe recurrent major depression with psychotic features ([place])   I certify that inpatient services furnished can reasonably be expected to improve the patient's condition.      Inda Merlin, NP 11/17/2022, 3:12 PMPatient ID: [name], female   DOB: [birth year], 22 y.o.   MRN: [id number]

## 2022-11-17 NOTE — Progress Notes (Signed)
[place] Signed Note  Patient Details Name: ANNIAH WICKLAND MRN: [id number] DOB: August 25, 2000 Today's Date: 11/17/2022   72 Hour Signed Documentation:  Admission Status: Voluntary/72 hour document signed Date 72 hour document signed : 11/17/22 Time 72 hour document signed : 1335 Provider Notified (First and Last Name) (see details for [facility] to note): [facility]    Johann Capers 11/17/2022, 1:39 PM

## 2022-11-17 NOTE — BHH Group Notes (Signed)
Adult Psychoeducational Group Note  Date:  11/17/2022 Time:  11:46 AM  Group Topic/Focus:  Goals Group:   The focus of this group is to help patients establish daily goals to achieve during treatment and discuss how the patient can incorporate goal setting into their daily lives to aide in recovery.  Participation Level:  Active  Participation Quality:  Appropriate  Affect:  Appropriate  Cognitive:  Appropriate  Insight: Appropriate  Engagement in Group:  Engaged  Modes of Intervention:  Activity and Discussion  Additional Comments:  Pt stated that her goal for today is to stay positive. Pt was engaged throughout the duration of group.   Melissa [name] December 11/17/2022, 11:46 AM

## 2022-11-17 NOTE — Progress Notes (Signed)
  Administered PRN Hydroxyzine and Tylenol per MAR per patient request. 

## 2022-11-18 ENCOUNTER — Encounter (HOSPITAL_COMMUNITY): Payer: Self-pay | Admitting: Emergency Medicine

## 2022-11-18 ENCOUNTER — Other Ambulatory Visit: Payer: Self-pay

## 2022-11-18 ENCOUNTER — Inpatient Hospital Stay (HOSPITAL_COMMUNITY): Payer: Self-pay

## 2022-11-18 DIAGNOSIS — F333 Major depressive disorder, recurrent, severe with psychotic symptoms: Secondary | ICD-10-CM | POA: Diagnosis not present

## 2022-11-18 MED ORDER — ARIPIPRAZOLE 2 MG PO TABS
2.0000 mg | ORAL_TABLET | Freq: Once | ORAL | Status: DC
  Filled 2022-11-18: qty 1

## 2022-11-18 MED ORDER — NITROGLYCERIN 0.4 MG SL SUBL: 0.4000 mg | SUBLINGUAL_TABLET | SUBLINGUAL | Status: DC | PRN

## 2022-11-18 MED ORDER — ARIPIPRAZOLE 5 MG PO TABS
5.0000 mg | ORAL_TABLET | Freq: Every day | ORAL | Status: DC
  Filled 2022-11-18 (×4): qty 1

## 2022-11-18 NOTE — Progress Notes (Signed)
   11/17/22 2300  Psych Admission Type (Psych Patients Only)  Admission Status Voluntary/72 hour document signed  Psychosocial Assessment  Patient Complaints Anxiety  Eye Contact Fair  Facial Expression Anxious  Affect Appropriate to circumstance  Speech Logical/coherent  Interaction Assertive  Motor Activity Slow  Appearance/Hygiene Unremarkable  Behavior Characteristics Cooperative;Appropriate to situation  Mood Depressed;Anxious  Thought Process  Coherency WDL  Content WDL  Delusions None reported or observed  Perception WDL  Hallucination None reported or observed  Judgment Poor  Confusion None  Danger to Self  Current suicidal ideation? Denies  Self-Injurious Behavior No self-injurious ideation or behavior indicators observed or expressed   Agreement Not to Harm Self Yes  Description of Agreement verbal  Danger to Others  Danger to Others None reported or observed

## 2022-11-18 NOTE — Progress Notes (Signed)
Call to [facility] non emergent and to [facility] ED charge RN to arrange for pt to go to [facility] ED for c/o CP.

## 2022-11-18 NOTE — BHH Group Notes (Signed)
[place] Group Notes:  (Nursing/MHT/Case Management/Adjunct)  Date:  11/18/2022  Time:  9:05 PM  Type of Therapy:  Group Therapy  Participation Level:  Active  Participation Quality:  Appropriate  Affect:  Appropriate  Cognitive:  Appropriate  Insight:  Appropriate  Engagement in Group:  Engaged  Modes of Intervention:  Education  Summary of Progress/Problems: Goal to stay positive. Day 8/10. Participated in anger management exercise.   [name] 11/18/2022, 9:05 PM

## 2022-11-18 NOTE — ED Triage Notes (Signed)
Pt arrives from [facility] w/ c/o anterior chest wall pain. Also reports right jaw pain. Reports was started on new meds and has had this happen before.

## 2022-11-18 NOTE — BHH Group Notes (Signed)
[place] Group Notes:  (Nursing/MHT/Case Management/Adjunct)  Date:  11/18/2022  Time:  9:18 AM  Type of Therapy:  Group Therapy  Participation Level:  Minimal  Participation Quality:  Appropriate  Affect:  Appropriate  Cognitive:  Appropriate  Insight:  Good  Engagement in Group:  Engaged  Modes of Intervention:  Discussion and Orientation  Summary of Progress/Problems: Goal is to stay positive  Chase Picket 11/18/2022, 9:18 AM

## 2022-11-18 NOTE — Progress Notes (Signed)
Pt came to the nurse complaining of 10/10 chest pain. Pt also complained of right jaw tightness. Pt V/S obtained and were normal, EKG done and suggested possible left atrial enlargement. The provider on call notified and suggested for the pt to be sent to the ED. Pt ambulatory at the time, will continue to monitor.

## 2022-11-18 NOTE — Progress Notes (Signed)
Contacted by nursing staff that patient is reporting midsternal chest pain radiating to right jaw. EKG and Nitro 0.4mg  SL ordered. Per nursing, patient is requesting to be transfered to the ED. Call placed to Dr. Regenia Skeeter at [facility] for medical clearance. Patient may return to [facility] post medical clearance. Eulogio Bear, RN is aware of transfer to [facility]ED.

## 2022-11-18 NOTE — ED Notes (Signed)
In addition to a light green, I collected a lavender and dark green and an additional light green and sent them all down to the lab.

## 2022-11-18 NOTE — BHH Group Notes (Signed)
[place] Group Notes:  (Nursing)  Date:  11/18/2022  Time:  400PM  Type of Therapy:  Nurse Education  Participation Level:  Did Not Attend  Waymond Cera 11/18/2022, 7:00 PM

## 2022-11-18 NOTE — ED Provider Notes (Signed)
[place] EMERGENCY DEPARTMENT AT [facility] Provider Note   CSN: [id number] Arrival date & time: 11/18/22  2212     History {Add pertinent medical, surgical, social history, OB history to HPI:1} Chief Complaint  Patient presents with   Chest Pain    Melissa [name] is a 22 y.o. female.  The history is provided by the patient and medical records.  Chest Pain  22 y.o. female with intellectual disability, anxiety, depression, presenting to the ED for chest pain.  She is currently at behavioral health undergoing treatment for worsening depression and suicidal ideation.  She states she was started on new medication and began having chest pain and pain in her jaw.  Reports history of similar in the past when starting new medications.  She has not had any cough or fever.  She has no known cardiac history.  Home Medications Prior to Admission medications   Medication Sig Start Date End Date Taking? Authorizing Provider  albuterol (VENTOLIN HFA) 108 (90 Base) MCG/ACT inhaler Inhale 1 puff into the lungs 4 (four) times daily as needed for wheezing or shortness of breath.    [provider]  cyclobenzaprine (FLEXERIL) 10 MG tablet Take 1 tablet (10 mg total) by mouth 2 (two) times daily as needed for muscle spasms. 11/08/22   Garrison, [place] N, FNP  ferrous sulfate 325 (65 FE) MG tablet Take 1 tablet (325 mg total) by mouth at bedtime. Patient taking differently: Take 325 mg by mouth every other day. 04/14/22   Starr Lake, CNM  hydrOXYzine (ATARAX) 25 MG tablet Take 1 tablet (25 mg total) by mouth 3 (three) times daily as needed for anxiety. TAKE 1 TABLET BY MOUTH 3 TIMES DAILY AS NEEDED FOR ITCHING. Patient taking differently: Take 25 mg by mouth 3 (three) times daily as needed for anxiety. 10/05/22   Vanessa Kick, MD  ibuprofen (ADVIL) 800 MG tablet Take 1 tablet (800 mg total) by mouth every 8 (eight) hours as needed for moderate pain. 11/08/22    Garrison, [place] N, FNP  loratadine (CLARITIN) 10 MG tablet Take 10 mg by mouth daily.    [provider]  medroxyPROGESTERone Acetate 150 MG/ML SUSY Inject 150 mg into the muscle every 3 (three) months. 08/08/22   [provider]  valACYclovir (VALTREX) 1000 MG tablet Take 1 tablet (1,000 mg total) by mouth 2 (two) times daily. 05/22/22   Tresea Mall, CNM  rizatriptan (MAXALT-MLT) 10 MG disintegrating tablet Take 1 tablet at onset of migraine with 2 ibuprofen may repeat an additional tablet in 2 hours if needed 05/07/20 06/09/20  Jodi Geralds, MD      Allergies    Benadryl [diphenhydramine hcl], Gluten meal, Shellfish allergy, Zithromax [azithromycin], Benadryl anti-itch childrens [camphor], Haldol [haloperidol], Haloperidol and related, Lactose intolerance (gi), and Latex    Review of Systems   Review of Systems  Cardiovascular:  Positive for chest pain.  All other systems reviewed and are negative.   Physical Exam Updated Vital Signs BP 132/83 (BP Location: Right Arm)   Pulse 66   Temp 98.2 F (36.8 C) (Oral)   Resp 18   Ht 5\' 7"  (1.702 m)   Wt 111.1 kg   LMP 11/08/2022   SpO2 100%   BMI 38.37 kg/m   Physical Exam Vitals and nursing note reviewed.  Constitutional:      Appearance: She is well-developed.  HENT:     Head: Normocephalic and atraumatic.  Eyes:  Conjunctiva/sclera: Conjunctivae normal.     Pupils: Pupils are equal, round, and reactive to light.  Cardiovascular:     Rate and Rhythm: Normal rate and regular rhythm.     Heart sounds: Normal heart sounds.  Pulmonary:     Effort: Pulmonary effort is normal.     Breath sounds: Normal breath sounds.  Abdominal:     General: Bowel sounds are normal.     Palpations: Abdomen is soft.  Musculoskeletal:        General: Normal range of motion.     Cervical back: Normal range of motion.  Skin:    General: Skin is warm and dry.  Neurological:     Mental Status: She is alert and  oriented to person, place, and time.     ED Results / Procedures / Treatments   Labs (all labs ordered are listed, but only abnormal results are displayed) Labs Reviewed  GLUCOSE, CAPILLARY - Abnormal; Notable for the following components:      Result Value   Glucose-Capillary 138 (*)    All other components within normal limits  TROPONIN I (HIGH SENSITIVITY)    EKG None  Radiology No results found.  Procedures Procedures  {Document cardiac monitor, telemetry assessment procedure when appropriate:1}  Medications Ordered in ED Medications  acetaminophen (TYLENOL) tablet 650 mg (650 mg Oral Given 11/18/22 1814)  alum & mag hydroxide-simeth (MAALOX/MYLANTA) 200-200-20 MG/5ML suspension 30 mL (has no administration in time range)  magnesium hydroxide (MILK OF MAGNESIA) suspension 30 mL (30 mLs Oral Given 11/17/22 1807)  hydrOXYzine (ATARAX) tablet 25 mg (25 mg Oral Given 11/18/22 0650)  OLANZapine zydis (ZYPREXA) disintegrating tablet 10 mg (has no administration in time range)    And  LORazepam (ATIVAN) tablet 1 mg (has no administration in time range)    And  ziprasidone (GEODON) injection 20 mg (has no administration in time range)  nicotine (NICODERM CQ - dosed in mg/24 hours) patch 14 mg (14 mg Transdermal Patient Refused/Not Given 11/18/22 0818)  ondansetron (ZOFRAN) tablet 4 mg (4 mg Oral Given 11/16/22 0947)  albuterol (VENTOLIN HFA) 108 (90 Base) MCG/ACT inhaler 1-2 puff (1 puff Inhalation Given 11/18/22 1456)  valACYclovir (VALTREX) tablet 1,000 mg (1,000 mg Oral Given 11/18/22 1814)  busPIRone (BUSPAR) tablet 5 mg (5 mg Oral Given 11/18/22 1815)  venlafaxine XR (EFFEXOR-XR) 24 hr capsule 112.5 mg (112.5 mg Oral Given 11/18/22 0816)    Followed by  venlafaxine XR (EFFEXOR-XR) 24 hr capsule 150 mg (has no administration in time range)  ARIPiprazole (ABILIFY) tablet 2 mg (2 mg Oral Patient Refused/Not Given 11/18/22 1814)    Followed by  ARIPiprazole (ABILIFY) tablet 5 mg (has  no administration in time range)  medroxyPROGESTERone (DEPO-PROVERA) injection 150 mg (150 mg Intramuscular Given 11/16/22 1314)    ED Course/ Medical Decision Making/ A&P   {   Click here for ABCD2, HEART and other calculatorsREFRESH Note before signing :1}                          Medical Decision Making Amount and/or Complexity of Data Reviewed Radiology: ordered.   ***  {Document critical care time when appropriate:1} {Document review of labs and clinical decision tools ie heart score, Chads2Vasc2 etc:1}  {Document your independent review of radiology images, and any outside records:1} {Document your discussion with family members, caretakers, and with consultants:1} {Document social determinants of health affecting pt's care:1} {Document your decision making why or why not admission,  treatments were needed:1} Final Clinical Impression(s) / ED Diagnoses Final diagnoses:  None    Rx / DC Orders ED Discharge Orders     None

## 2022-11-19 ENCOUNTER — Inpatient Hospital Stay (HOSPITAL_COMMUNITY): Payer: Self-pay

## 2022-11-19 LAB — TROPONIN I (HIGH SENSITIVITY): Troponin I (High Sensitivity): <2 ng/L (ref <18)

## 2022-11-19 MED ORDER — ESCITALOPRAM OXALATE 10 MG PO TABS
10.0000 mg | ORAL_TABLET | Freq: Every day | ORAL | Status: DC
  Administered 2022-11-19 – 2022-11-20 (×2): 10 mg via ORAL
  Filled 2022-11-19 (×4): qty 1

## 2022-11-19 MED ORDER — RISPERIDONE 0.5 MG PO TABS
0.5000 mg | ORAL_TABLET | Freq: Every day | ORAL | Status: DC
  Administered 2022-11-20 – 2022-11-22 (×3): 0.5 mg via ORAL
  Filled 2022-11-19 (×7): qty 1

## 2022-11-19 NOTE — Group Note (Signed)
Date:  11/19/2022 Time:  5:17 PM  Group Topic/Focus:  Spirituality:   The focus of this group is to discuss how one's spirituality can aide in recovery.    Participation Level:  Active  Participation Quality:  Appropriate  Affect:  Appropriate  Cognitive:  Appropriate  Insight: Appropriate  Engagement in Group:  Engaged  Modes of Intervention:  Exploration  Additional Comments:     Jerrye Beavers 11/19/2022, 5:17 PM

## 2022-11-19 NOTE — Progress Notes (Signed)
Adult Psychoeducational Group Note  Date:  11/19/2022 Time:  9:04 PM  Group Topic/Focus:  Wrap-Up Group:   The focus of this group is to help patients review their daily goal of treatment and discuss progress on daily workbooks.  Participation Level:  Minimal  Participation Quality:  Appropriate  Affect:  Appropriate  Cognitive:  Appropriate  Insight: Improving  Engagement in Group:  Engaged  Modes of Intervention:  Education  Additional Comments:  Pt attended the evening wrap-up group. Tech introduced the staff for the evening, reminded group of the evening schedule and reminded them to ask for anything they need. PT introduced themself to the group and shared a coping skill they use or have learned.  [name] 11/19/2022, 9:04 PM

## 2022-11-19 NOTE — Progress Notes (Addendum)
Patient stated she feels better now about her medicines after talking to the MD.  She would like to abilify and effexor to be discontinued after talking to MD.

## 2022-11-19 NOTE — BHH Counselor (Signed)
Adult Comprehensive Assessment  Patient ID: Melissa [name], female   DOB: 07-Aug-2001, 22 y.o.   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  The patient would benefit from crisis stabilization, milieu participation, medication evaluation and management, group therapy, psychoeducation, safety monitoring, and discharge planning.  At discharge it is recommended that the patient adhere to the established aftercare plan.  Melissa [name]. 11/19/2022

## 2022-11-19 NOTE — Plan of Care (Signed)
  Problem: Education: Goal: Knowledge of [place] [facility] Education information/materials will improve Outcome: Progressing Goal: Emotional status will improve Outcome: Progressing Goal: Mental status will improve Outcome: Progressing Goal: Verbalization of understanding the information provided will improve Outcome: Progressing   Problem: Activity: Goal: Interest or engagement in activities will improve Outcome: Progressing Goal: Sleeping patterns will improve Outcome: Progressing   

## 2022-11-19 NOTE — Progress Notes (Signed)
   11/19/22 0800  Psych Admission Type (Psych Patients Only)  Admission Status Voluntary  Psychosocial Assessment  Patient Complaints Anxiety  Eye Contact Fair  Facial Expression Anxious  Affect Appropriate to circumstance  Speech Logical/coherent  Interaction Assertive  Motor Activity Slow  Appearance/Hygiene Unremarkable  Behavior Characteristics Cooperative;Appropriate to situation  Mood Pleasant  Thought Process  Coherency WDL  Content WDL  Delusions None reported or observed  Perception WDL  Hallucination None reported or observed  Judgment Poor  Confusion None  Danger to Self  Current suicidal ideation? Denies  Self-Injurious Behavior No self-injurious ideation or behavior indicators observed or expressed   Agreement Not to Harm Self Yes  Description of Agreement Verbal  Danger to Others  Danger to Others None reported or observed

## 2022-11-19 NOTE — Progress Notes (Signed)
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  This is patient's second time being admitted to [facility].  Patient was assessed by Ranae Palms, MD and Ileene Musa, PA-C for reevaluation.  Patient is currently being managed on the following psychiatric medications:  Venlafaxine 112.5 mg daily Buspirone 5 mg 3 times daily Hydroxyzine 25 mg 3 times daily as needed  Agitation protocol  -Olanzapine 10 mg every 8 hours as needed for agitation AND  -Lorazepam 1 mg as needed for anxiety and severe agitation AND  -Ziprasidone 20 mg intramuscular injection as needed for agitation  During the evaluation, patient agreed to rescind her request for discharge. Patient denied experiencing any negative thoughts or images towards her child. Patient did express wanting to discontinue Effexor due to the medication causing her hallucinations when taking it. Patient described her hallucinations as the walls closing in on her or her bed shaking. Due to the side effects experienced from her use Effexor, patient has refused to take both her Effexor and her Abilify.  Patient reports that she has been on both Prozac and Zoloft for the management of her depression.  She reports that both Prozac and Zoloft made her feel suicidal.  Patient reports that she is interested in trying out Wellbutrin stating that her mother has been on Wellbutrin and it  was helpful for her.  Patient endorses depression and rates her depression as 3 out of 10 with 10 being most severe.  She states that her symptoms have seemingly improved since being admitted to this facility.  Patient endorses anxiety and rates her anxiety at 4 out of 10.  Patient's main stressors include missing her kids and the fear of being kept here for a while.  Patient is alert and oriented x4, calm, cooperative, and fully engaged in conversation during the encounter. Patient exhibits good eye contact. Patient's speech is clear, coherent, and with normal rate. Patient's thought process in coherent and goal oriented. Patient's thought content is logical. Patient exhibits depressed and anxious mood with congruent affect. Patient denies suicidal or homicidal ideations. She further denies auditory or visual hallucinations and does not appear to be responding to internal/external stimuli.  Patient denies paranoia or delusional thoughts.  Patient endorses good appetite.  Patient endorses fair sleep and receives on average 6 hours of sleep each night.   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 last attack June [id number]   Complication of anesthesia  Congenital hydronephrosis 2001   Constipation    Depression    Episodic tension-type headache, not intractable 04/16/2015   Family  history of adverse reaction to anesthesia    mom rushed to hospital from Dentist office, pt her mother & grandmother have trouble waking fully after anesthesia   Genital herpes    Low back strain, sequela 11/10/2020   Migraine without aura and without status migrainosus, not intractable 04/16/2015   PID (pelvic inflammatory disease)    Post-partum depression 11/17/2021   Seizures ([place])    last one in 2015 - on meds   Sickle cell trait ([place])    TBI (traumatic brain injury) ([place]) 2006    Past Surgical History:  Procedure Laterality Date   APPENDECTOMY     CESAREAN SECTION N/A 08/31/2021   Procedure: CESAREAN SECTION;  Surgeon: Gwynne Edinger, MD;  Location: [facility] LD ORS;  Service: Obstetrics;  Laterality: N/A;   CESAREAN SECTION N/A 06/05/2022   Procedure: CESAREAN SECTION;  Surgeon: Chancy Milroy, MD;  Location: [facility] LD ORS;  Service: Obstetrics;  Laterality: N/A;   HERNIA REPAIR     INTESTINAL MALROTATION REPAIR  2001   Family History:  Family History  Problem Relation Age of Onset   Depression Mother    Stroke Mother    Obesity Mother    Post-traumatic stress disorder Mother    Anxiety disorder Mother    Hypertension Mother    Diabetes Mother    Schizophrenia Father    Kidney disease Father    Family Psychiatric  History:  See H&P  Social History:  Social History   Substance and Sexual Activity  Alcohol Use Not Currently   Comment: occasionally, prior to pregnancy     Social History   Substance and Sexual Activity  Drug Use Not Currently   Types: Marijuana   Comment: last 2020    Social History   Socioeconomic History   Marital status: Single    Spouse name: Not on file   Number of children: Not on file   Years of education: Not on file   Highest education level: Not on file  Occupational History   Not on file  Tobacco Use   Smoking status: Every Day    Packs/day: .25    Types: Cigarettes   Smokeless tobacco: Never   Tobacco comments:    black and  milds, 2 cigs daily  Vaping Use   Vaping Use: Former  Substance and Sexual Activity   Alcohol use: Not Currently    Comment: occasionally, prior to pregnancy   Drug use: Not Currently    Types: Marijuana    Comment: last 2020   Sexual activity: Not Currently    Partners: Male    Birth control/protection: None  Other Topics Concern   Not on file  Social History Narrative   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   She attended [employer].    She lives with her parents, siblings, and grandfather.    She enjoys writing, singing, dancing, cooking, and shopping.   She attends [employer].   Social Determinants of Health   Financial Resource Strain: Not on file  Food Insecurity: Food Insecurity Present (11/14/2022)   Hunger Vital Sign    Worried About Running Out of Food in the Last Year: Sometimes true    Ran Out of Food in the Last Year: Sometimes true  Transportation Needs: No Transportation Needs (11/14/2022)   PRAPARE - Transportation    Lack of  Transportation (Medical): No    Lack of Transportation (Non-Medical): No  Recent Concern: Transportation Needs - Unmet Transportation Needs (11/14/2022)   PRAPARE - Hydrologist (Medical): Yes    Lack of Transportation (Non-Medical): Yes  Physical Activity: Not on file  Stress: Not on file  Social Connections: Not on file   Additional Social History:  See H&P  Sleep: Fair  Appetite:  Good  Current Medications: Current Facility-Administered Medications  Medication Dose Route Frequency Provider Last Rate Last Admin   acetaminophen (TYLENOL) tablet 650 mg  650 mg Oral Q6H PRN Scot Jun, NP   650 mg at 11/18/22 1814   albuterol (VENTOLIN HFA) 108 (90 Base) MCG/ACT inhaler 1-2 puff  1-2 puff Inhalation Q4H PRN Massengill, Ovid Curd, MD   1 puff at 11/18/22 1456   alum & mag hydroxide-simeth (MAALOX/MYLANTA) 200-200-20 MG/5ML suspension 30 mL  30 mL Oral Q4H PRN Scot Jun, NP        busPIRone (BUSPAR) tablet 5 mg  5 mg Oral TID Laretta Bolster, FNP   5 mg at 11/19/22 1700   escitalopram (LEXAPRO) tablet 10 mg  10 mg Oral Daily Canden Cieslinski E, PA   10 mg at 11/19/22 1456   hydrOXYzine (ATARAX) tablet 25 mg  25 mg Oral TID PRN Scot Jun, NP   25 mg at 11/19/22 1646   OLANZapine zydis (ZYPREXA) disintegrating tablet 10 mg  10 mg Oral Q8H PRN Scot Jun, NP       And   LORazepam (ATIVAN) tablet 1 mg  1 mg Oral PRN Scot Jun, NP       And   ziprasidone (GEODON) injection 20 mg  20 mg Intramuscular PRN Scot Jun, NP       magnesium hydroxide (MILK OF MAGNESIA) suspension 30 mL  30 mL Oral Daily PRN Scot Jun, NP   30 mL at 11/17/22 1807   nicotine (NICODERM CQ - dosed in mg/24 hours) patch 14 mg  14 mg Transdermal Daily Evette Georges, NP       ondansetron [facility]) tablet 4 mg  4 mg Oral Q8H PRN Janine Limbo, MD   4 mg at 11/16/22 0947   risperiDONE (RISPERDAL) tablet 0.5 mg  0.5 mg Oral QHS Antonietta Lansdowne E, PA       valACYclovir (VALTREX) tablet 1,000 mg  1,000 mg Oral BID Massengill, Ovid Curd, MD   1,000 mg at 11/19/22 1700   venlafaxine XR (EFFEXOR-XR) 24 hr capsule 150 mg  150 mg Oral Q breakfast Leevy-Johnson, Brooke A, NP        Lab Results:  Results for orders placed or performed during the hospital encounter of 11/14/22 (from the past 48 hour(s))  Troponin I (High Sensitivity)     Status: None   Collection Time: 11/18/22 11:49 PM  Result Value Ref Range   Troponin I (High Sensitivity) <2 <18 ng/L    Comment: (NOTE) Elevated high sensitivity troponin I (hsTnI) values and significant  changes across serial measurements may suggest ACS but many other  chronic and acute conditions are known to elevate hsTnI results.  Refer to the "Links" section for chest pain algorithms and additional  guidance. Performed at [facility], [place] [address], [place], [place] [address]     Blood Alcohol level:  Lab  Results  Component Value Date   [facility] <10 11/14/2022   ETH <10 [id number]    Metabolic Disorder Labs: Lab Results  Component Value Date   HGBA1C 5.3 11/14/2022   MPG 105 11/14/2022   MPG 93.93 11/19/2021   Lab Results  Component Value Date   PROLACTIN 14.4 11/14/2022   PROLACTIN 8.8 01/01/2017   Lab Results  Component Value Date   CHOL 153 11/14/2022   TRIG 75 11/14/2022   HDL 37 (L) 11/14/2022   CHOLHDL 4.1 11/14/2022   VLDL 15 11/14/2022   LDLCALC 101 (H) 11/14/2022   LDLCALC 105 (H) 11/19/2021    Physical Findings: AIMS:  , ,  ,  ,    CIWA:    COWS:     Musculoskeletal: Strength & Muscle Tone: within normal limits Gait & Station: normal Patient leans: N/A  Psychiatric Specialty Exam:  Presentation  General Appearance:  Casual  Eye Contact: Good  Speech: Clear and Coherent; Normal Rate  Speech Volume: Normal  Handedness: Right   Mood and Affect  Mood: Depressed; Anxious  Affect: Congruent   Thought Process  Thought Processes: Coherent; Goal Directed  Descriptions of Associations:Intact  Orientation:Full (Time, Place and Person)  Thought Content:Logical  History of Schizophrenia/Schizoaffective disorder:No  Duration of Psychotic Symptoms:No data recorded Hallucinations:Hallucinations: None  Ideas of Reference:None  Suicidal Thoughts:Suicidal Thoughts: No  Homicidal Thoughts:Homicidal Thoughts: No   Sensorium  Memory: Immediate Good; Recent Good  Judgment: Fair  Insight: Fair   Community education officer  Concentration: Good  Attention Span: Good  Recall: Good  Fund of Knowledge: Good  Language: Good   Psychomotor Activity  Psychomotor Activity: Psychomotor Activity: Normal   Assets  Assets: Communication Skills; Desire for Improvement; Housing; Physical Health; Resilience; Social Support   Sleep  Sleep: Sleep: Fair    Physical Exam: Physical Exam Constitutional:      Appearance: She is  well-developed.  HENT:     Head: Normocephalic and atraumatic.  Eyes:     Extraocular Movements: Extraocular movements intact.  Cardiovascular:     Rate and Rhythm: Normal rate and regular rhythm.  Pulmonary:     Effort: Pulmonary effort is normal.  Abdominal:     General: Abdomen is flat.  Musculoskeletal:        General: Normal range of motion.     Cervical back: Normal range of motion.  Skin:    General: Skin is warm and dry.  Neurological:     General: No focal deficit present.     Mental Status: She is alert and oriented to person, place, and time.  Psychiatric:        Attention and Perception: Attention and perception normal. She does not perceive auditory or visual hallucinations.        Mood and Affect: Mood is anxious and depressed. Affect is blunt.        Speech: Speech normal.        Behavior: Behavior normal. Behavior is cooperative.        Thought Content: Thought content normal. Thought content is not paranoid or delusional. Thought content does not include homicidal or suicidal ideation.        Cognition and Memory: Cognition and memory normal.        Judgment: Judgment normal.    Review of Systems  Constitutional: Negative.   HENT: Negative.    Eyes: Negative.   Respiratory: Negative.    Cardiovascular: Negative.   Gastrointestinal: Negative.   Skin: Negative.   Neurological: Negative.   Psychiatric/Behavioral:  Positive for depression. Negative for hallucinations, substance abuse and suicidal ideas. The patient is nervous/anxious. The patient does not have  insomnia.    Blood pressure 115/83, pulse 80, temperature 97.9 F (36.6 C), temperature source Oral, resp. rate 16, height 5\' 7"  (1.702 m), weight 111.1 kg, last menstrual period 11/08/2022, SpO2 100 %, unknown if currently breastfeeding. Body mass index is 38.37 kg/m.   Treatment Plan Summary:  Daily contact with patient to assess and evaluate symptoms and progress in treatment and Medication  management  Plan:  During the assessment, patient agreed to rescind her 72-hour request for discharge.  Patient refuses to take Effexor or Abilify stating that Effexor made her experience hallucinations characterized by walls closing in on her and her legs shaking.  Patient continues to experience depression and anxiety and is open to other options for medication management.  Provider discussed with patient being placed on escitalopram 10 mg daily for the management of her depressive symptoms.  Patient was also recommended Risperdal 0.5 mg at bedtime for mood stability.  Patient was agreeable to recommendations.  Safety and Monitoring: Voluntary admission to inpatient psychiatric unit for safety, stabilization and treatment Daily contact with patient to assess and evaluate symptoms and progress in treatment Patient's case to be discussed in multi-disciplinary team meeting Observation Level : q15 minute checks Vital signs: q12 hours Precautions: suicide, elopement, and assault   Diagnosis: Principal Problem:   Severe recurrent major depression with psychotic features ([place]) Active Problems:   History of suicide attempt   GAD (generalized anxiety disorder)   History of postpartum depression  #Severe recurrent major depression with psychotic features -Start escitalopram 10 mg daily for depression -Start Risperdal 0.5 mg at bedtime for mood stability and psychosis -Discontinue Abilify 5 mg daily -Discontinue Venlafaxine XR 112.5 mg daily  #Generalized anxiety disorder -Start escitalopram 10 mg daily for anxiety -Continue buspirone 5 mg 3 times daily for anxiety  #Tobacco cessation -NicoDerm CQ 14 mg / 24-hour transdermal patch daily  Agitation protocol  -Olanzapine 10 mg every 8 hours as needed for agitation AND  -Lorazepam 1 mg as needed for anxiety and severe agitation AND  -Ziprasidone 20 mg intramuscular injection as needed for agitation  As needed medications: -Patient to  continue taking Tylenol 650 mg every 6 hours as needed for mild pain -Patient to continue taking Maalox/Mylanta 30 mL every 4 hours as needed for indigestion -Patient to continue taking Milk of Magnesia 30 mL as needed for mild constipation -Hydroxyzine 25 mg 3 times daily as needed for anxiety -Ondansetron 4 mg every 8 hours as needed for nausea and vomiting  Discharge Planning: Social work and case management to assist with discharge planning and identification of hospital follow-up needs prior to discharge Estimated LOS: 5-7 days Discharge Concerns: Need to establish a safety plan; Medication compliance and effectiveness Discharge Goals: Return home with outpatient referrals for mental health follow-up including medication management/psychotherapy   I certify that inpatient services furnished can reasonably be expected to improve the patient's condition.    Malachy Mood, PA 11/19/2022, 8:38 PM

## 2022-11-19 NOTE — Progress Notes (Signed)
Pt is back to the unit from the ED, pt is ambulatory, alert an d oriented. Pt denies pain at this time. Snacks and drink offered, will continue to monitor.

## 2022-11-19 NOTE — ED Notes (Signed)
Urine sample collected and sent to the lab.

## 2022-11-19 NOTE — ED Notes (Signed)
Safe transport called for pick up. 

## 2022-11-19 NOTE — ED Notes (Signed)
Report given to Rubin Payor

## 2022-11-19 NOTE — Progress Notes (Signed)
   Patient reports anxiety 3/10, patient denies SI/HI/AVH.  Patient verbally contracts for safety. Patient refused her scheduled Risperdal at bedtime, patient stated that she is experiencing side effects from her medications, that she has been sleeping a lot. Patient requested  for drug information on Risperdal and stated she wants to study the side effects before taking it, patient's education and drug information provided. Patient remains safe on the unit, q 15 minute safety checks maintained.   11/19/22 2100  Psych Admission Type (Psych Patients Only)  Admission Status Voluntary  Psychosocial Assessment  Patient Complaints Anxiety  Eye Contact Fair  Facial Expression Anxious  Affect Appropriate to circumstance  Speech Logical/coherent  Interaction Assertive  Motor Activity Slow  Appearance/Hygiene Unremarkable  Behavior Characteristics Appropriate to situation  Mood Angry;Depressed;Anxious  Thought Process  Coherency WDL  Content WDL  Delusions None reported or observed  Perception WDL  Hallucination None reported or observed  Judgment Poor  Confusion None  Danger to Self  Current suicidal ideation? Denies  Self-Injurious Behavior No self-injurious ideation or behavior indicators observed or expressed   Agreement Not to Harm Self Yes  Description of Agreement Veebal  Danger to Others  Danger to Others None reported or observed

## 2022-11-19 NOTE — Plan of Care (Signed)
  Problem: Education: Goal: Emotional status will improve Outcome: Progressing Goal: Verbalization of understanding the information provided will improve Outcome: Progressing   Problem: Coping: Goal: Ability to verbalize frustrations and anger appropriately will improve Outcome: Progressing   Problem: Health Behavior/Discharge Planning: Goal: Compliance with treatment plan for underlying cause of condition will improve Outcome: Progressing   Problem: Education: Goal: Knowledge of the prescribed therapeutic regimen will improve Outcome: Progressing   Problem: Coping: Goal: Coping ability will improve Outcome: Progressing Goal: Will verbalize feelings Outcome: Progressing

## 2022-11-20 ENCOUNTER — Encounter (HOSPITAL_COMMUNITY): Payer: Self-pay

## 2022-11-20 MED ORDER — ESCITALOPRAM OXALATE 5 MG PO TABS
5.0000 mg | ORAL_TABLET | Freq: Every day | ORAL | Status: DC
  Administered 2022-11-21 – 2022-11-23 (×3): 5 mg via ORAL
  Filled 2022-11-20 (×5): qty 1

## 2022-11-20 MED ORDER — TAB-A-VITE/IRON PO TABS
1.0000 | ORAL_TABLET | Freq: Every day | ORAL | Status: DC
  Administered 2022-11-21 – 2022-11-23 (×3): 1 via ORAL
  Filled 2022-11-20 (×4): qty 1

## 2022-11-20 NOTE — Plan of Care (Signed)
  Problem: Education: Goal: Knowledge of [place] [facility] Education information/materials will improve Outcome: Progressing Goal: Emotional status will improve Outcome: Progressing Goal: Mental status will improve Outcome: Progressing Goal: Verbalization of understanding the information provided will improve Outcome: Progressing   Problem: Coping: Goal: Ability to verbalize frustrations and anger appropriately will improve Outcome: Progressing   Problem: Safety: Goal: Periods of time without injury will increase Outcome: Progressing   Problem: Health Behavior/Discharge Planning: Goal: Ability to make decisions will improve Outcome: Progressing
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
## 2022-11-20 NOTE — Progress Notes (Signed)
  Delight Ovens, Chaplain  Othelia Pulling, Chaplain Spiritual care group on grief and loss facilitated by [employer], [employer] and Chaplain Emmerich Cryer   Group Goal: Support / Education around grief and loss   Members engage in facilitated group support and psycho-social education.   Group Description:   Following introductions and group rules, group members engaged in facilitated group dialogue and support around topic of loss, with particular support around experiences of loss in their lives. Group Identified types of loss (relationships / self / things) and identified patterns, circumstances, and changes that precipitate losses. Reflected on thoughts / feelings around loss, normalized grief responses, and recognized variety in grief experience. Group encouraged individual reflection on safe space and on the coping skills that they are already utilizing.   Group drew on Adlerian / Rogerian and narrative framework   Patient Progress: Patient did not attend.       Evalina Tabak [employer]

## 2022-11-20 NOTE — Group Note (Signed)
Date:  11/20/2022 Time:  9:33 AM  Group Topic/Focus:  Orientation:   The focus of this group is to educate the patient on the purpose and policies of crisis stabilization and provide a format to answer questions about their admission.  The group details unit policies and expectations of patients while admitted.    Participation Level:  Active  Participation Quality:  Appropriate  Affect:  Appropriate  Cognitive:  Appropriate  Insight: Appropriate  Engagement in Group:  Engaged  Modes of Intervention:  Discussion  Additional Comments:     Jerrye Beavers 11/20/2022, 9:33 AM

## 2022-11-20 NOTE — Group Note (Signed)
Recreation Therapy Group Note   Group Topic:Health and Wellness  Group Date: 11/20/2022 Start Time: 0930 End Time: 1000 Facilitators: Riki Gehring-McCall, LRT,CTRS Location: [address] Dayroom   Goal Area(s) Addresses:  Patient will verbalize benefit of exercise during group session. Patient will identify an exercise that can be completed post d/c. Patient will acknowledge benefits of exercise when used as a coping mechanism.   Group Description:  Exercise.  LRT introduced the activity of exercise patients.  LRT explained to patients they would be leading the activity by choosing the exercises that were completed by the group.  LRT and group completed three rounds of exercises.  Patients were instructed to take breaks if needed and listen to their bodies so as to not do any exercises that are outside of their ability.   Affect/Mood: Appropriate   Participation Level: Engaged   Participation Quality: Independent   Behavior: Appropriate   Speech/Thought Process: Focused   Insight: Good   Judgement: Good   Modes of Intervention: Music   Patient Response to Interventions:  Engaged   Education Outcome:  Acknowledges education and In group clarification offered    Clinical Observations/Individualized Feedback: Pt attended and participated in group session.    Plan: Continue to engage patient in RT group sessions 2-3x/week.   Oddie Kuhlmann-McCall, LRT,CTRS 11/20/2022 12:51 PM

## 2022-11-20 NOTE — [facility] Group Notes (Signed)
PsychoEducational Group Note. Patients were given poem by Cristopher Peru, titled '' There's a hole in my sidewalk'' as it describes repeating negative behavioral patterns. Patients were then asked to reflect on negative patterns in their own life they would like to change, using reflection and positive reframing. Pt attended late but did participate.

## 2022-11-20 NOTE — Progress Notes (Signed)
Pt is pleasant upon approach.  Observed sitting in dayroom, reading the Bible, and interacting with peers.  Pt said that all of her medications cause bad side effects.  She declined to take risperdal last night and initally declined taking lexapro this morning but decided to take lexapro later in the afternoon. RN provided education and support to pt.  Pt is coping well at this time.

## 2022-11-20 NOTE — Group Note (Signed)
Date:  11/20/2022 Time:  9:54 AM  Group Topic/Focus:  Orientation:   The focus of this group is to educate the patient on the purpose and policies of crisis stabilization and provide a format to answer questions about their admission.  The group details unit policies and expectations of patients while admitted.    Participation Level:  Active  Participation Quality:  Appropriate  Affect:  Appropriate  Cognitive:  Appropriate  Insight: Appropriate  Engagement in Group:  Engaged  Modes of Intervention:  Discussion  Additional Comments:     Jerrye Beavers 11/20/2022, 9:54 AM

## 2022-11-20 NOTE — Progress Notes (Signed)
   11/20/22 0616  15 Minute Checks  Location Bedroom  Visual Appearance Calm  Behavior Composed  Sleep (Behavioral Health Patients Only)  Calculate sleep? (Click Yes once per 24 hr at 0600 safety check) Yes  Documented sleep last 24 hours 8.5

## 2022-11-20 NOTE — Progress Notes (Cosign Needed Addendum)
[facility] MD Progress Note  11/20/2022 5:43 PM Melissa [name]  MRN:  [id number]  Subjective:  Melissa [name] states, " I feel a lot better mentally, however, I think the Lexapro is making me dizzy and having nightmares.  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  This is patient's second time being admitted to [facility].  Patient was assessed by Garrison Columbus, NP for reevaluation.  Patient is currently being managed on the following psychiatric medications:  Lexapro 5 mg p.o. daily for depression Risperidone disintegrating tab 0.5 mg p.o. daily for psychosis Buspirone 5 mg 3 times daily for anxiety Hydroxyzine 25 mg 3 times daily as needed  Agitation protocol  -Olanzapine 10 mg every 8 hours as needed for agitation AND  -Lorazepam 1 mg as needed for anxiety and severe agitation AND  -Ziprasidone 20 mg intramuscular injection as needed for agitation  Assessment: Patient is seen and examined on [address] sitting in a chair in the office.  During the evaluation, patient denies experiencing any intrusive thoughts towards her 62 months old child.  Reports nightmares and dizziness with Lexapro 10 mg p.o. daily for depression.  Lexapro was reduced from 10 mg p.o. to 5 mg p.o. daily.  Will continue to monitor the effect. Patient reports less depressed mood with congruent affect.  She  rates her depression as 0 out of 10 with 10 being most severe.  She states that her symptoms have seemingly improved since being admitted to this facility.  Present with smiles during this evaluation.  Patient endorses anxiety and rates her anxiety at 3 out of 10, which is much improved.  Patient's main stressors include missing her kids and the fear of being held in the hospital for a long time.  Therapeutic support was provided to patient stressors.  Patient is alert and oriented to person, place, time and situation.  Presents calm, cooperative, and fully engaged in the examination.  Patient exhibits good eye contact. Patient's speech is clear, coherent, and with normal rate and volume. Patient's thought process in coherent and goal oriented. Patient's thought content is logical.  Patient denies suicidal or homicidal ideations. She further denies auditory or visual hallucinations and does not appear to be responding to internal/external stimuli.  Patient denies paranoia or delusional thoughts.  Patient endorses good appetite.  Report sleeping an average 7 hours last night.  Vital signs within normal limits.  Will continue current treatment plan with adjustment as noted above.  Lab reviewed with potassium level of 3.6, and normal CBC with hemoglobin 13.0.  Patient made aware of lab results, and modification of her treatment plan.   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 last attack June [id number]   Complication of anesthesia    Congenital hydronephrosis 2001   Constipation    Depression    Episodic tension-type headache, not intractable 04/16/2015   Family history of adverse reaction to anesthesia    mom rushed to hospital from Dentist office, pt her mother & grandmother have trouble waking fully after anesthesia   Genital herpes    Low back strain, sequela 11/10/2020   Migraine without aura and without status migrainosus, not intractable 04/16/2015   PID (pelvic inflammatory disease)    Post-partum depression 11/17/2021   Seizures ([place])    last one in 2015 - on meds   Sickle cell trait ([name])    TBI (traumatic brain injury) ([place]) 2006    Past Surgical History:  Procedure Laterality Date   APPENDECTOMY     CESAREAN SECTION N/A 08/31/2021   Procedure: CESAREAN SECTION;  Surgeon: Gwynne Edinger, MD;  Location: [facility] LD ORS;  Service: Obstetrics;  Laterality: N/A;   CESAREAN SECTION N/A 06/05/2022   Procedure: CESAREAN SECTION;  Surgeon: Chancy Milroy, MD;  Location: [facility] LD ORS;  Service: Obstetrics;  Laterality: N/A;   HERNIA REPAIR     INTESTINAL MALROTATION REPAIR  2001   Family History:  Family History  Problem Relation Age of Onset   Depression Mother    Stroke Mother    Obesity Mother    Post-traumatic stress disorder Mother    Anxiety disorder Mother    Hypertension Mother    Diabetes Mother    Schizophrenia Father    Kidney disease Father    Family Psychiatric  History:  See H&P  Social History:  Social History   Substance and Sexual Activity  Alcohol Use Not Currently   Comment: occasionally, prior to pregnancy     Social History   Substance and Sexual Activity  Drug Use Not Currently   Types: Marijuana   Comment: last 2020    Social History   Socioeconomic History   Marital status: Single    Spouse name: Not on file   Number of  children: Not on file   Years of education: Not on file   Highest education level: Not on file  Occupational History   Not on file  Tobacco Use   Smoking status: Every Day    Packs/day: .25    Types: Cigarettes   Smokeless tobacco: Never   Tobacco comments:    black and milds, 2 cigs daily  Vaping Use   Vaping Use: Former  Substance and Sexual Activity   Alcohol use: Not Currently    Comment: occasionally, prior to pregnancy   Drug use: Not Currently    Types: Marijuana    Comment: last 2020   Sexual activity: Not Currently    Partners: Male    Birth control/protection: None  Other Topics Concern   Not on file  Social History Narrative   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   She attended [employer].    She lives with her parents, siblings, and grandfather.    She enjoys writing, singing, dancing, cooking, and shopping.   She attends [employer].   Social Determinants of Health   Financial Resource Strain: Not on  file  Food Insecurity: Food Insecurity Present (11/14/2022)   Hunger Vital Sign    Worried About Running Out of Food in the Last Year: Sometimes true    Ran Out of Food in the Last Year: Sometimes true  Transportation Needs: No Transportation Needs (11/14/2022)   PRAPARE - Hydrologist (Medical): No    Lack of Transportation (Non-Medical): No  Recent Concern: Transportation Needs - Unmet Transportation Needs (11/14/2022)   PRAPARE - Hydrologist (Medical): Yes    Lack of Transportation (Non-Medical): Yes  Physical Activity: Not on file  Stress: Not on file  Social Connections: Not on file   Additional Social History:  See H&P  Sleep: Fair  Appetite:  Good  Current Medications: Current Facility-Administered Medications  Medication Dose Route Frequency Provider Last Rate Last Admin   acetaminophen (TYLENOL) tablet 650 mg  650 mg Oral Q6H PRN Scot Jun, NP   650 mg at 11/18/22 1814    albuterol (VENTOLIN HFA) 108 (90 Base) MCG/ACT inhaler 1-2 puff  1-2 puff Inhalation Q4H PRN Massengill, Ovid Curd, MD   2 puff at 11/20/22 0950   alum & mag hydroxide-simeth (MAALOX/MYLANTA) 200-200-20 MG/5ML suspension 30 mL  30 mL Oral Q4H PRN Scot Jun, NP       busPIRone (BUSPAR) tablet 5 mg  5 mg Oral TID Laretta Bolster, FNP   5 mg at 11/20/22 1248   [START ON 11/21/2022] escitalopram (LEXAPRO) tablet 5 mg  5 mg Oral Daily Massengill, Nathan, MD       hydrOXYzine (ATARAX) tablet 25 mg  25 mg Oral TID PRN Scot Jun, NP   25 mg at 11/20/22 0807   OLANZapine zydis (ZYPREXA) disintegrating tablet 10 mg  10 mg Oral Q8H PRN Scot Jun, NP       And   LORazepam (ATIVAN) tablet 1 mg  1 mg Oral PRN Scot Jun, NP       And   ziprasidone (GEODON) injection 20 mg  20 mg Intramuscular PRN Scot Jun, NP       magnesium hydroxide (MILK OF MAGNESIA) suspension 30 mL  30 mL Oral Daily PRN Scot Jun, NP   30 mL at 11/17/22 1807   [START ON 11/21/2022] multivitamins with iron tablet 1 tablet  1 tablet Oral Daily Massengill, Ovid Curd, MD       nicotine (NICODERM CQ - dosed in mg/24 hours) patch 14 mg  14 mg Transdermal Daily Evette Georges, NP       ondansetron [facility]) tablet 4 mg  4 mg Oral Q8H PRN Janine Limbo, MD   4 mg at 11/16/22 0947   risperiDONE (RISPERDAL) tablet 0.5 mg  0.5 mg Oral QHS Nwoko, Uchenna E, PA       valACYclovir (VALTREX) tablet 1,000 mg  1,000 mg Oral BID Janine Limbo, MD   1,000 mg at 11/20/22 [id number]    Lab Results:  Results for orders placed or performed during the hospital encounter of 11/14/22 (from the past 48 hour(s))  Troponin I (High Sensitivity)     Status: None   Collection Time: 11/18/22 11:49 PM  Result Value Ref Range   Troponin I (High Sensitivity) <2 <18 ng/L    Comment: (NOTE) Elevated high sensitivity troponin I (hsTnI) values and significant  changes across serial measurements may suggest ACS but many other   chronic and acute conditions are known to elevate hsTnI results.  Refer to  the "Links" section for chest pain algorithms and additional  guidance. Performed at [facility], [place] [address] [place], [place] [address]     Blood Alcohol level:  Lab Results  Component Value Date   ETH <10 11/14/2022   ETH <10 [id number]    Metabolic Disorder Labs: Lab Results  Component Value Date   HGBA1C 5.3 11/14/2022   MPG 105 11/14/2022   MPG 93.93 11/19/2021   Lab Results  Component Value Date   PROLACTIN 14.4 11/14/2022   PROLACTIN 8.8 01/01/2017   Lab Results  Component Value Date   CHOL 153 11/14/2022   TRIG 75 11/14/2022   HDL 37 (L) 11/14/2022   CHOLHDL 4.1 11/14/2022   VLDL 15 11/14/2022   LDLCALC 101 (H) 11/14/2022   LDLCALC 105 (H) 11/19/2021    Physical Findings: AIMS:  , ,  ,  ,    CIWA:    COWS:     Musculoskeletal: Strength & Muscle Tone: within normal limits Gait & Station: normal Patient leans: N/A  Psychiatric Specialty Exam:  Presentation  General Appearance:  Appropriate for Environment; Casual; Fairly Groomed  Eye Contact: Good  Speech: Clear and Coherent  Speech Volume: Normal  Handedness: Right  Mood and Affect  Mood: Anxious; Depressed  Affect: Congruent  Thought Process  Thought Processes: Goal Directed; Coherent  Descriptions of Associations:Intact  Orientation:Full (Time, Place and Person)  Thought Content:Logical  History of Schizophrenia/Schizoaffective disorder:No data recorded  Duration of Psychotic Symptoms:No data recorded Hallucinations:Hallucinations: None Description of Auditory Hallucinations: Denies  Ideas of Reference:None  Suicidal Thoughts:Suicidal Thoughts: No SI Passive Intent and/or Plan: -- (Denies)  Homicidal Thoughts:Homicidal Thoughts: No  Sensorium  Memory: Immediate Good; Recent Good  Judgment: Fair  Insight: Fair  Community education officer   Concentration: Good  Attention Span: Good  Recall: Good  Fund of Knowledge: Good  Language: Good  Psychomotor Activity  Psychomotor Activity: Psychomotor Activity: Normal  Assets  Assets: Communication Skills; Desire for Improvement; Housing; Physical Health; Social Support  Sleep  Sleep: Sleep: Good Number of Hours of Sleep: 7  Physical Exam: Physical Exam Constitutional:      Appearance: She is well-developed.  HENT:     Head: Normocephalic and atraumatic.  Eyes:     Extraocular Movements: Extraocular movements intact.  Cardiovascular:     Rate and Rhythm: Normal rate and regular rhythm.  Pulmonary:     Effort: Pulmonary effort is normal.  Abdominal:     General: Abdomen is flat.  Musculoskeletal:        General: Normal range of motion.     Cervical back: Normal range of motion.  Skin:    General: Skin is warm and dry.  Neurological:     General: No focal deficit present.     Mental Status: She is alert and oriented to person, place, and time.  Psychiatric:        Attention and Perception: Attention and perception normal. She does not perceive auditory or visual hallucinations.        Mood and Affect: Mood is anxious and depressed. Affect is blunt.        Speech: Speech normal.        Behavior: Behavior normal. Behavior is cooperative.        Thought Content: Thought content normal. Thought content is not paranoid or delusional. Thought content does not include homicidal or suicidal ideation.        Cognition and Memory: Cognition and memory normal.  Judgment: Judgment normal.    Review of Systems  Constitutional: Negative.   HENT: Negative.    Eyes: Negative.   Respiratory: Negative.    Cardiovascular: Negative.   Gastrointestinal: Negative.   Skin: Negative.   Neurological: Negative.   Psychiatric/Behavioral:  Positive for depression. Negative for hallucinations, substance abuse and suicidal ideas. The patient is nervous/anxious. The  patient does not have insomnia.    Blood pressure 118/80, pulse 98, temperature 97.9 F (36.6 C), temperature source Oral, resp. rate 16, height 5\' 7"  (1.702 m), weight 111.1 kg, last menstrual period 11/08/2022, SpO2 100 %, unknown if currently breastfeeding. Body mass index is 38.37 kg/m.   Treatment Plan Summary:  Daily contact with patient to assess and evaluate symptoms and progress in treatment and Medication management  Plan:  During the assessment, patient agreed to rescind her 72-hour request for discharge.  Patient refuses to take Effexor or Abilify stating that Effexor made her experience hallucinations characterized by walls closing in on her and her legs shaking.  Patient continues to experience depression and anxiety and is open to other options for medication management.  Provider discussed with patient being placed on escitalopram 10 mg daily for the management of her depressive symptoms.  Patient was also recommended Risperdal 0.5 mg at bedtime for mood stability.  Patient was agreeable to recommendations.  Safety and Monitoring: Voluntary admission to inpatient psychiatric unit for safety, stabilization and treatment Daily contact with patient to assess and evaluate symptoms and progress in treatment Patient's case to be discussed in multi-disciplinary team meeting Observation Level : q15 minute checks Vital signs: q12 hours Precautions: suicide, elopement, and assault   Diagnosis: Principal Problem:   Severe recurrent major depression with psychotic features ([place]) Active Problems:   History of suicide attempt   GAD (generalized anxiety disorder)   History of postpartum depression  #Severe recurrent major depression with psychotic features -Decrease escitalopram 10 mg daily to 5 mg p.o. daily for depression -Continue Risperdal 0.5 mg at bedtime for mood stability and psychosis -Discontinue Abilify 5 mg daily -Discontinue Venlafaxine XR 112.5 mg daily  #Generalized  anxiety disorder -Decrease escitalopram 10 mg daily to 5 mg p.o. daily for anxiety -Continue buspirone 5 mg 3 times daily for anxiety  #Tobacco cessation -NicoDerm CQ 14 mg / 24-hour transdermal patch daily  Agitation protocol  -Olanzapine 10 mg every 8 hours as needed for agitation AND  -Lorazepam 1 mg as needed for anxiety and severe agitation AND  -Ziprasidone 20 mg intramuscular injection as needed for agitation  As needed medications: -Patient to continue taking Tylenol 650 mg every 6 hours as needed for mild pain -Patient to continue taking Maalox/Mylanta 30 mL every 4 hours as needed for indigestion -Patient to continue taking Milk of Magnesia 30 mL as needed for mild constipation -Hydroxyzine 25 mg 3 times daily as needed for anxiety -Ondansetron 4 mg every 8 hours as needed for nausea and vomiting  Discharge Planning: Social work and case management to assist with discharge planning and identification of hospital follow-up needs prior to discharge Estimated LOS: 5-7 days Discharge Concerns: Need to establish a safety plan; Medication compliance and effectiveness Discharge Goals: Return home with outpatient referrals for mental health follow-up including medication management/psychotherapy   I certify that inpatient services furnished can reasonably be expected to improve the patient's condition.    Laretta Bolster, FNP 11/20/2022, 5:43 PMPatient ID: [name], female   DOB: 11-09-2000, 22 y.o.   MRN: [id number]

## 2022-11-20 NOTE — [facility] Group Notes (Signed)
Patient attended the AA group. 

## 2022-11-20 NOTE — BH IP Treatment Plan (Unsigned)
Interdisciplinary Treatment and Diagnostic Plan Update  11/20/2022 Time of Session: 8:30am, update  Melissa [name] MRN: [id number]  Principal Diagnosis: Severe recurrent major depression with psychotic features  Secondary Diagnoses: Principal Problem:   Severe recurrent major depression with psychotic features Active Problems:   History of suicide attempt   GAD (generalized anxiety disorder)   History of postpartum depression   Current Medications:  Current Facility-Administered Medications  Medication Dose Route Frequency Provider Last Rate Last Admin   acetaminophen (TYLENOL) tablet 650 mg  650 mg Oral Q6H PRN Scot Jun, NP   650 mg at 11/18/22 1814   albuterol (VENTOLIN HFA) 108 (90 Base) MCG/ACT inhaler 1-2 puff  1-2 puff Inhalation Q4H PRN Janine Limbo, MD   2 puff at 11/20/22 0950   alum & mag hydroxide-simeth (MAALOX/MYLANTA) 200-200-20 MG/5ML suspension 30 mL  30 mL Oral Q4H PRN Scot Jun, NP       busPIRone (BUSPAR) tablet 5 mg  5 mg Oral TID Laretta Bolster, FNP   5 mg at 11/20/22 1248   escitalopram (LEXAPRO) tablet 10 mg  10 mg Oral Daily Nwoko, Uchenna E, PA   10 mg at 11/20/22 1248   hydrOXYzine (ATARAX) tablet 25 mg  25 mg Oral TID PRN Scot Jun, NP   25 mg at 11/20/22 0807   OLANZapine zydis (ZYPREXA) disintegrating tablet 10 mg  10 mg Oral Q8H PRN Scot Jun, NP       And   LORazepam (ATIVAN) tablet 1 mg  1 mg Oral PRN Scot Jun, NP       And   ziprasidone (GEODON) injection 20 mg  20 mg Intramuscular PRN Scot Jun, NP       magnesium hydroxide (MILK OF MAGNESIA) suspension 30 mL  30 mL Oral Daily PRN Scot Jun, NP   30 mL at 11/17/22 1807   nicotine (NICODERM CQ - dosed in mg/24 hours) patch 14 mg  14 mg Transdermal Daily Evette Georges, NP       ondansetron [facility]) tablet 4 mg  4 mg Oral Q8H PRN Massengill, Ovid Curd, MD   4 mg at 11/16/22 0947   risperiDONE (RISPERDAL) tablet 0.5 mg  0.5 mg Oral  QHS Nwoko, Uchenna E, PA       valACYclovir (VALTREX) tablet 1,000 mg  1,000 mg Oral BID Massengill, Ovid Curd, MD   1,000 mg at 11/20/22 0807   PTA Medications: Medications Prior to Admission  Medication Sig Dispense Refill Last Dose   albuterol (VENTOLIN HFA) 108 (90 Base) MCG/ACT inhaler Inhale 1 puff into the lungs 4 (four) times daily as needed for wheezing or shortness of breath.      cyclobenzaprine (FLEXERIL) 10 MG tablet Take 1 tablet (10 mg total) by mouth 2 (two) times daily as needed for muscle spasms. 20 tablet 0    ferrous sulfate 325 (65 FE) MG tablet Take 1 tablet (325 mg total) by mouth at bedtime. (Patient taking differently: Take 325 mg by mouth every other day.) 30 tablet 3    hydrOXYzine (ATARAX) 25 MG tablet Take 1 tablet (25 mg total) by mouth 3 (three) times daily as needed for anxiety. TAKE 1 TABLET BY MOUTH 3 TIMES DAILY AS NEEDED FOR ITCHING. (Patient taking differently: Take 25 mg by mouth 3 (three) times daily as needed for anxiety.) 90 tablet 1    ibuprofen (ADVIL) 800 MG tablet Take 1 tablet (800 mg total) by mouth every 8 (eight) hours as needed  for moderate pain. 21 tablet 0    loratadine (CLARITIN) 10 MG tablet Take 10 mg by mouth daily.      medroxyPROGESTERone Acetate 150 MG/ML SUSY Inject 150 mg into the muscle every 3 (three) months.      valACYclovir (VALTREX) 1000 MG tablet Take 1 tablet (1,000 mg total) by mouth 2 (two) times daily. 20 tablet 5     Patient Stressors: Financial difficulties   Health problems    Patient Strengths: Marketing executive fund of knowledge  Motivation for treatment/growth  Supportive family/friends   Treatment Modalities: Medication Management, Group therapy, Case management,  1 to 1 session with clinician, Psychoeducation, Recreational therapy.   Physician Treatment Plan for Primary Diagnosis: Severe recurrent major depression with psychotic features Long Term Goal(s): Improvement in symptoms so as ready for  discharge   Short Term Goals: Ability to identify changes in lifestyle to reduce recurrence of condition will improve Ability to verbalize feelings will improve Ability to disclose and discuss suicidal ideas Ability to demonstrate self-control will improve Ability to identify and develop effective coping behaviors will improve Ability to maintain clinical measurements within normal limits will improve Compliance with prescribed medications will improve Ability to identify triggers associated with substance abuse/mental health issues will improve  Medication Management: Evaluate patient's response, side effects, and tolerance of medication regimen.  Therapeutic Interventions: 1 to 1 sessions, Unit Group sessions and Medication administration.  Evaluation of Outcomes: Progressing  Physician Treatment Plan for Secondary Diagnosis: Principal Problem:   Severe recurrent major depression with psychotic features Active Problems:   History of suicide attempt   GAD (generalized anxiety disorder)   History of postpartum depression  Long Term Goal(s): Improvement in symptoms so as ready for discharge   Short Term Goals: Ability to identify changes in lifestyle to reduce recurrence of condition will improve Ability to verbalize feelings will improve Ability to disclose and discuss suicidal ideas Ability to demonstrate self-control will improve Ability to identify and develop effective coping behaviors will improve Ability to maintain clinical measurements within normal limits will improve Compliance with prescribed medications will improve Ability to identify triggers associated with substance abuse/mental health issues will improve     Medication Management: Evaluate patient's response, side effects, and tolerance of medication regimen.  Therapeutic Interventions: 1 to 1 sessions, Unit Group sessions and Medication administration.  Evaluation of Outcomes: Progressing   RN Treatment  Plan for Primary Diagnosis: Severe recurrent major depression with psychotic features Long Term Goal(s): Knowledge of disease and therapeutic regimen to maintain health will improve  Short Term Goals: Ability to remain free from injury will improve, Ability to verbalize frustration and anger appropriately will improve, Ability to demonstrate self-control, Ability to participate in decision making will improve, Ability to verbalize feelings will improve, Ability to disclose and discuss suicidal ideas, Ability to identify and develop effective coping behaviors will improve, and Compliance with prescribed medications will improve  Medication Management: RN will administer medications as ordered by provider, will assess and evaluate patient's response and provide education to patient for prescribed medication. RN will report any adverse and/or side effects to prescribing provider.  Therapeutic Interventions: 1 on 1 counseling sessions, Psychoeducation, Medication administration, Evaluate responses to treatment, Monitor vital signs and CBGs as ordered, Perform/monitor CIWA, COWS, AIMS and Fall Risk screenings as ordered, Perform wound care treatments as ordered.  Evaluation of Outcomes: Progressing   LCSW Treatment Plan for Primary Diagnosis: Severe recurrent major depression with psychotic features Long Term Goal(s): Safe transition  to appropriate next level of care at discharge, Engage patient in therapeutic group addressing interpersonal concerns.  Short Term Goals: Engage patient in aftercare planning with referrals and resources, Increase social support, Increase ability to appropriately verbalize feelings, Increase emotional regulation, Facilitate acceptance of mental health diagnosis and concerns, Facilitate patient progression through stages of change regarding substance use diagnoses and concerns, Identify triggers associated with mental health/substance abuse issues, and Increase skills for  wellness and recovery  Therapeutic Interventions: Assess for all discharge needs, 1 to 1 time with Social worker, Explore available resources and support systems, Assess for adequacy in community support network, Educate family and significant other(s) on suicide prevention, Complete Psychosocial Assessment, Interpersonal group therapy.  Evaluation of Outcomes: Progressing   Progress in Treatment: Attending groups: Yes. Participating in groups: Yes. Taking medication as prescribed: Yes. Toleration medication: Yes. Family/Significant other contact made: Yes, individual(s) contacted:  Georgana Curio (Mother) [phone] Patient understands diagnosis: Yes. Discussing patient identified problems/goals with staff: Yes. Medical problems stabilized or resolved: Yes. Denies suicidal/homicidal ideation: Yes. Issues/concerns per patient self-inventory: No.  New problem(s) identified: No, Describe:  none reported   New Short Term/Long Term Goal(s):   medication stabilization, elimination of SI thoughts, development of comprehensive mental wellness plan.    Patient Goals: " Work on what triggers my anxiety, panic attacks, and depression "     Discharge Plan or Barriers: Patient will be connected to med management and therapy  Reason for Continuation of Hospitalization: Anxiety Depression Homicidal ideation Medication stabilization  Estimated Length of Stay: 1-3 days  Last 3 [place] Suicide Severity Risk Score: Flowsheet [place] ED to Hosp-Admission (Current) from 11/14/2022 in [place] [facility] Most recent reading at 11/18/2022 10:28 PM ED from 11/14/2022 in [facility] Most recent reading at 11/14/2022  8:57 AM ED from 11/08/2022 in [facility] [place] Most recent reading at 11/08/2022  5:00 PM  C-SSRS RISK CATEGORY Error: Q3, 4, or 5 should not be populated when Q2 is No Moderate Risk No Risk       Last PHQ  2/9 Scores:    04/07/2022   10:55 AM 02/08/2022    1:49 PM 12/15/2021    9:34 AM  Depression screen PHQ 2/9  Decreased Interest 1 2 1   Down, Depressed, Hopeless 1 2 0  PHQ - 2 Score 2 4 1   Altered sleeping 0 2   Tired, decreased energy 1 1   Change in appetite 0 0   Feeling bad or failure about yourself  0 0   Trouble concentrating 1 1   Moving slowly or fidgety/restless 1 1   Suicidal thoughts 0 0   PHQ-9 Score 5 9     Scribe for Treatment Team: Zachery Conch, LCSW 11/20/2022 4:24 PM

## 2022-11-21 MED ORDER — NICOTINE POLACRILEX 2 MG MT GUM
2.0000 mg | CHEWING_GUM | OROMUCOSAL | Status: DC | PRN
  Administered 2022-11-21: 2 mg via ORAL

## 2022-11-21 NOTE — Plan of Care (Signed)
  Problem: Health Behavior/Discharge Planning: Goal: Identification of resources available to assist in meeting health care needs will improve Outcome: Progressing   Problem: Education: Goal: Knowledge of the prescribed therapeutic regimen will improve Outcome: Progressing   Problem: Health Behavior/Discharge Planning: Goal: Ability to make decisions will improve Outcome: Progressing Goal: Compliance with therapeutic regimen will improve Outcome: Progressing   Problem: Safety: Goal: Ability to disclose and discuss suicidal ideas will improve Outcome: Progressing Goal: Ability to identify and utilize support systems that promote safety will improve Outcome: Progressing   Problem: Self-Concept: Goal: Level of anxiety will decrease Outcome: Progressing

## 2022-11-21 NOTE — Progress Notes (Addendum)
Pt denied SI/HI/AVH this morning. Pt rated her depression a 0/10, anxiety a 0/10, and feelings of hopelessness a 0/10. Pt complains of 10/10 pain from her wisdom teeth growing in. PRN Tylenol and heat pack provided to patient for tooth pain. Pt approached RN tearful and anxious stating "I just wish I could go home". RN provided support and encouragement. Pt given scheduled medications as prescribed. Q15 min checks verified for safety. Patient verbally contracts for safety. Patient compliant with medications and treatment plan. Patient is interacting well on the unit. Pt is safe on the unit.   11/21/22 1000  Psych Admission Type (Psych Patients Only)  Admission Status Voluntary  Psychosocial Assessment  Patient Complaints None  Eye Contact Fair  Facial Expression Sad  Affect Appropriate to circumstance  Speech Logical/coherent  Interaction Assertive  Motor Activity Slow  Appearance/Hygiene Unremarkable  Behavior Characteristics Appropriate to situation  Mood Depressed  Thought Process  Coherency WDL  Content WDL  Delusions None reported or observed  Perception WDL  Hallucination None reported or observed  Judgment Impaired  Confusion None  Danger to Self  Current suicidal ideation? Denies  Description of Suicide Plan No plan  Self-Injurious Behavior No self-injurious ideation or behavior indicators observed or expressed   Agreement Not to Harm Self Yes  Description of Agreement Pt verbally contracts for safety  Danger to Others  Danger to Others None reported or observed

## 2022-11-21 NOTE — Group Note (Signed)
Recreation Therapy Group Note   Group Topic:Animal Assisted Therapy   Group Date: 11/21/2022 Start Time: 0945 End Time: 1030 Facilitators: Rikki Smestad-McCall, LRT,CTRS Location: [address] Dayroom   Animal-Assisted Activity (AAA) Program Checklist/Progress Notes Patient Eligibility Criteria Checklist & Daily Group note for Rec Tx Intervention  AAA/T Program Assumption of Risk Form signed by Patient/ or Parent Legal Guardian Yes  Patient is free of allergies or severe asthma Yes  Patient reports no fear of animals Yes  Patient reports no history of cruelty to animals Yes  Patient understands his/her participation is voluntary Yes  Patient washes hands before animal contact Yes  Patient washes hands after animal contact Yes   Affect/Mood: Appropriate   Participation Level: Active   Participation Quality: Independent   Behavior: Appropriate   Speech/Thought Process: Focused    Clinical Observations/Individualized Feedback: Pt was more social and active during group.  Pt was attentive throughout group.    Plan: Continue to engage patient in RT group sessions 2-3x/week.   Fadia Marlar-McCall, LRT,CTRS 11/21/2022 12:26 PM

## 2022-11-21 NOTE — Progress Notes (Signed)
   11/21/22 0555  15 Minute Checks  Location Bedroom  Visual Appearance Calm  Behavior Composed  Sleep (Behavioral Health Patients Only)  Calculate sleep? (Click Yes once per 24 hr at 0600 safety check) Yes  Documented sleep last 24 hours 7

## 2022-11-21 NOTE — Progress Notes (Signed)
   11/20/22 2100  Psych Admission Type (Psych Patients Only)  Admission Status Voluntary  Psychosocial Assessment  Patient Complaints Anhedonia  Eye Contact Fair  Facial Expression Anxious  Affect Appropriate to circumstance  Speech Logical/coherent  Interaction Assertive  Motor Activity Slow  Appearance/Hygiene Unremarkable  Behavior Characteristics Appropriate to situation  Mood Depressed;Anxious  Thought Process  Coherency WDL  Content WDL  Delusions None reported or observed  Perception WDL  Hallucination None reported or observed  Judgment Poor  Confusion None  Danger to Self  Current suicidal ideation? Denies  Self-Injurious Behavior No self-injurious ideation or behavior indicators observed or expressed   Agreement Not to Harm Self Yes  Description of Agreement Verbal  Danger to Others  Danger to Others None reported or observed

## 2022-11-21 NOTE — Progress Notes (Signed)
Adult Psychoeducational Group Note  Date:  11/21/2022 Time:  9:09 PM  Group Topic/Focus:  Wrap-Up Group:   The focus of this group is to help patients review their daily goal of treatment and discuss progress on daily workbooks.  Participation Level:  Active  Participation Quality:  Appropriate  Affect:  Appropriate  Cognitive:  Appropriate  Insight: Appropriate  Engagement in Group:  Engaged  Modes of Intervention:  Discussion  Additional Comments:  Earica said her day was a 67. Her goal for today get prepared for discharge and do a safety plan. She achieved goal and she leaves Thursday. Her copong skills listen and to talk calm.   Lenice Llamas Long 11/21/2022, 9:09 PM

## 2022-11-21 NOTE — Progress Notes (Signed)
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  This is patient's second time being admitted to [facility].  Patient was assessed by Garrison Columbus, NP for reevaluation.  Patient is currently being managed on the following psychiatric medications:  Lexapro 5 mg p.o. daily for depression Risperidone disintegrating tab 0.5 mg p.o. daily for psychosis Buspirone 5 mg 3 times daily for anxiety Hydroxyzine 25 mg 3 times daily as needed  Agitation protocol  -Olanzapine 10 mg every 8 hours as needed for agitation AND  -Lorazepam 1 mg as needed for anxiety and severe agitation AND  -Ziprasidone 20 mg intramuscular injection as needed for agitation  Assessment: Patient is seen and examined on [address] sitting in a chair in the office.  Melissa [name] reports she feels much better, happy and energetic with no intrusive thoughts. Patient reports less depressed mood with congruent affect.  She rates her depression as #0  with 10 being most severe.  She states that her symptoms have seemingly improved since being admitted to this facility.  Present with smiles during this evaluation.  Patient endorses anxiety and rates her anxiety as #0 which is much improved from yesterday.  Patient report missing her kids and the fear of being held at [facility] for a long time.  Therapeutic/emotional support was provided to patient ongoing stressors.  Requesting to know  when she will be discharged.  Made patient aware that she will be monitored for few days due to adjustment to her treatment regimen and then will be discharged.  Patient  visible on the unit and actively participating in therapeutic milieu and group activities.   Patient is alert and oriented to person, place, time and situation.  Presents calm, cooperative, and fully engaged in the examination.  Patient exhibits good eye contact. Patient's speech is clear, coherent, and with normal rate and volume. Patient's thought process in coherent and goal oriented. Patient's thought content is logical.  Continues on the scheduled psychotropic medications without noted somatic discomfort.  Patient denies suicidal or homicidal ideations. She further denies auditory or visual hallucinations and does not appear to be responding to internal.  Patient denies paranoia or delusional thoughts.  Patient endorses good appetite.  Report sleeping an average 7 hours last night. Will continue current treatment plan with adjustment as noted above.  Lab reviewed with potassium level of 3.6, and normal CBC with hemoglobin 13.0.  Patient made aware of lab results, and modification of her treatment plan.   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 last attack June [id number]   Complication of anesthesia    Congenital  hydronephrosis 2001   Constipation    Depression    Episodic tension-type headache, not intractable 04/16/2015   Family history of adverse reaction to anesthesia    mom rushed to hospital from Dentist office, pt her mother & grandmother have trouble waking fully after anesthesia   Genital herpes    Low back strain, sequela 11/10/2020   Migraine without aura and without status migrainosus, not intractable 04/16/2015   PID (pelvic inflammatory disease)    Post-partum depression 11/17/2021   Seizures ([place])    last one in 2015 - on meds   Sickle cell trait ([name])    TBI (traumatic brain injury) ([place]) 2006    Past Surgical History:  Procedure Laterality Date   APPENDECTOMY     CESAREAN SECTION N/A 08/31/2021   Procedure: CESAREAN SECTION;  Surgeon: Gwynne Edinger, MD;  Location: [facility] LD ORS;  Service: Obstetrics;  Laterality: N/A;   CESAREAN SECTION N/A 06/05/2022   Procedure: CESAREAN SECTION;  Surgeon: Chancy Milroy, MD;  Location: [facility] LD ORS;  Service: Obstetrics;  Laterality: N/A;   HERNIA REPAIR     INTESTINAL MALROTATION REPAIR  2001   Family History:  Family History  Problem Relation Age of Onset   Depression Mother    Stroke Mother    Obesity Mother    Post-traumatic stress disorder Mother    Anxiety disorder Mother    Hypertension Mother    Diabetes Mother    Schizophrenia Father    Kidney disease Father    Family Psychiatric  History:  See H&P  Social History:  Social History   Substance and Sexual Activity  Alcohol Use Not Currently   Comment: occasionally, prior to pregnancy     Social History   Substance and Sexual Activity  Drug Use Not Currently   Types: Marijuana   Comment: last 2020    Social History   Socioeconomic History   Marital status: Single    Spouse name: Not on file   Number of children: Not on file   Years of education: Not on file   Highest education level: Not on file  Occupational History   Not on file  Tobacco Use    Smoking status: Every Day    Packs/day: .25    Types: Cigarettes   Smokeless tobacco: Never   Tobacco comments:    black and milds, 2 cigs daily  Vaping Use   Vaping Use: Former  Substance and Sexual Activity   Alcohol use: Not Currently    Comment: occasionally, prior to pregnancy   Drug use: Not Currently    Types: Marijuana    Comment: last 2020   Sexual activity: Not Currently    Partners: Male    Birth control/protection: None  Other Topics Concern   Not on file  Social History Narrative   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   She attended [employer].    She lives with her parents, siblings, and grandfather.    She enjoys writing, singing, dancing, cooking, and shopping.   She attends [employer].   Social Determinants of Health   Financial Resource Strain: Not on file  Food Insecurity: Food Insecurity Present (11/14/2022)   Hunger Vital Sign    Worried About Running Out of Food in the Last Year: Sometimes true    Ran Out of Food in the  Last Year: Sometimes true  Transportation Needs: No Transportation Needs (11/14/2022)   PRAPARE - Hydrologist (Medical): No    Lack of Transportation (Non-Medical): No  Recent Concern: Transportation Needs - Unmet Transportation Needs (11/14/2022)   PRAPARE - Hydrologist (Medical): Yes    Lack of Transportation (Non-Medical): Yes  Physical Activity: Not on file  Stress: Not on file  Social Connections: Not on file   Additional Social History:  See H&P  Sleep: Fair  Appetite:  Good  Current Medications: Current Facility-Administered Medications  Medication Dose Route Frequency Provider Last Rate Last Admin   acetaminophen (TYLENOL) tablet 650 mg  650 mg Oral Q6H PRN Scot Jun, NP   650 mg at 11/21/22 1102   albuterol (VENTOLIN HFA) 108 (90 Base) MCG/ACT inhaler 1-2 puff  1-2 puff Inhalation Q4H PRN Massengill, Ovid Curd, MD   2 puff at 11/21/22 0737    alum & mag hydroxide-simeth (MAALOX/MYLANTA) 200-200-20 MG/5ML suspension 30 mL  30 mL Oral Q4H PRN Scot Jun, NP       busPIRone (BUSPAR) tablet 5 mg  5 mg Oral TID Laretta Bolster, FNP   5 mg at 11/21/22 1633   escitalopram (LEXAPRO) tablet 5 mg  5 mg Oral Daily Massengill, Nathan, MD   5 mg at 11/21/22 0737   hydrOXYzine (ATARAX) tablet 25 mg  25 mg Oral TID PRN Scot Jun, NP   25 mg at 11/21/22 0737   OLANZapine zydis (ZYPREXA) disintegrating tablet 10 mg  10 mg Oral Q8H PRN Scot Jun, NP       And   LORazepam (ATIVAN) tablet 1 mg  1 mg Oral PRN Scot Jun, NP       And   ziprasidone (GEODON) injection 20 mg  20 mg Intramuscular PRN Scot Jun, NP       magnesium hydroxide (MILK OF MAGNESIA) suspension 30 mL  30 mL Oral Daily PRN Scot Jun, NP   30 mL at 11/17/22 1807   multivitamins with iron tablet 1 tablet  1 tablet Oral Daily Massengill, Nathan, MD   1 tablet at 11/21/22 0740   nicotine (NICODERM CQ - dosed in mg/24 hours) patch 14 mg  14 mg Transdermal Daily Evette Georges, NP       nicotine polacrilex (NICORETTE) gum 2 mg  2 mg Oral PRN Massengill, Ovid Curd, MD   2 mg at 11/21/22 1252   ondansetron (ZOFRAN) tablet 4 mg  4 mg Oral Q8H PRN Massengill, Ovid Curd, MD   4 mg at 11/16/22 0947   risperiDONE (RISPERDAL) tablet 0.5 mg  0.5 mg Oral QHS Nwoko, Uchenna E, PA   0.5 mg at 11/20/22 2135   valACYclovir (VALTREX) tablet 1,000 mg  1,000 mg Oral BID Janine Limbo, MD   1,000 mg at 11/21/22 1633    Lab Results:  No results found for this or any previous visit (from the past 48 hour(s)).   Blood Alcohol level:  Lab Results  Component Value Date   [facility] <10 11/14/2022   ETH <10 [id number]    Metabolic Disorder Labs: Lab Results  Component Value Date   HGBA1C 5.3 11/14/2022   MPG 105 11/14/2022   MPG 93.93 11/19/2021   Lab Results  Component Value Date   PROLACTIN 14.4 11/14/2022   PROLACTIN 8.8 01/01/2017   Lab Results   Component Value Date   CHOL 153 11/14/2022   TRIG 75 11/14/2022  HDL 37 (L) 11/14/2022   CHOLHDL 4.1 11/14/2022   VLDL 15 11/14/2022   LDLCALC 101 (H) 11/14/2022   LDLCALC 105 (H) 11/19/2021    Physical Findings: AIMS:  , ,  ,  ,    CIWA:    COWS:     Musculoskeletal: Strength & Muscle Tone: within normal limits Gait & Station: normal Patient leans: N/A  Psychiatric Specialty Exam:  Presentation  General Appearance:  Appropriate for Environment; Fairly Groomed; Casual  Eye Contact: Good  Speech: Clear and Coherent; Normal Rate  Speech Volume: Normal  Handedness: Right  Mood and Affect  Mood: Anxious  Affect: Congruent  Thought Process  Thought Processes: Coherent; Goal Directed  Descriptions of Associations:Intact  Orientation:Full (Time, Place and Person)  Thought Content:Logical  History of Schizophrenia/Schizoaffective disorder:No data recorded  Duration of Psychotic Symptoms:No data recorded Hallucinations:Hallucinations: None Description of Auditory Hallucinations: Denies  Ideas of Reference:None  Suicidal Thoughts:Suicidal Thoughts: No SI Passive Intent and/or Plan: -- (Denies)  Homicidal Thoughts:Homicidal Thoughts: No  Sensorium  Memory: Immediate Good; Recent Good  Judgment: Fair  Insight: Fair  Community education officer  Concentration: Good  Attention Span: Good  Recall: Good  Fund of Knowledge: Good  Language: Good  Psychomotor Activity  Psychomotor Activity: Psychomotor Activity: Normal  Assets  Assets: Communication Skills; Desire for Improvement; Physical Health; Resilience; Social Support  Sleep  Sleep: Sleep: Good Number of Hours of Sleep: 7  Physical Exam: Physical Exam Vitals and nursing note reviewed.  Constitutional:      Appearance: She is well-developed.  HENT:     Head: Normocephalic and atraumatic.  Eyes:     Extraocular Movements: Extraocular movements intact.  Cardiovascular:      Rate and Rhythm: Regular rhythm. Tachycardia present.     Comments: Blood pressure 98/81, pulse at 112.  Patient remains asymptomatic.  Nursing staff to recheck vital signs. Pulmonary:     Effort: Pulmonary effort is normal.  Abdominal:     General: Abdomen is flat.  Musculoskeletal:        General: Normal range of motion.     Cervical back: Normal range of motion.  Skin:    General: Skin is warm and dry.  Neurological:     General: No focal deficit present.     Mental Status: She is alert and oriented to person, place, and time.  Psychiatric:        Attention and Perception: Attention and perception normal. She does not perceive auditory or visual hallucinations.        Mood and Affect: Mood is anxious and depressed. Affect is blunt.        Speech: Speech normal.        Behavior: Behavior normal. Behavior is cooperative.        Thought Content: Thought content normal. Thought content is not paranoid or delusional. Thought content does not include homicidal or suicidal ideation.        Cognition and Memory: Cognition and memory normal.        Judgment: Judgment normal.    Review of Systems  Constitutional: Negative.   HENT: Negative.    Eyes: Negative.   Respiratory: Negative.    Cardiovascular: Negative.        Blood pressure 98/81, pulse at 112.  Patient remains asymptomatic.  Nursing staff to recheck vital signs   Gastrointestinal: Negative.   Skin: Negative.   Neurological: Negative.   Psychiatric/Behavioral:  Positive for depression. Negative for hallucinations, substance abuse and suicidal ideas. The  patient is nervous/anxious. The patient does not have insomnia.    Blood pressure 98/81, pulse (!) 112, temperature 97.9 F (36.6 C), temperature source Oral, resp. rate 16, height 5\' 7"  (1.702 m), weight 111.1 kg, last menstrual period 11/08/2022, SpO2 100 %, unknown if currently breastfeeding. Body mass index is 38.37 kg/m.   Treatment Plan Summary:  Daily contact with  patient to assess and evaluate symptoms and progress in treatment and Medication management  Plan:  During the assessment, patient agreed to rescind her 72-hour request for discharge.  Patient refuses to take Effexor or Abilify stating that Effexor made her experience hallucinations characterized by walls closing in on her and her legs shaking.  Patient continues to experience depression and anxiety and is open to other options for medication management.  Provider discussed with patient being placed on escitalopram 10 mg daily for the management of her depressive symptoms.  Patient was also recommended Risperdal 0.5 mg at bedtime for mood stability.  Patient was agreeable to recommendations.  Safety and Monitoring: Voluntary admission to inpatient psychiatric unit for safety, stabilization and treatment Daily contact with patient to assess and evaluate symptoms and progress in treatment Patient's case to be discussed in multi-disciplinary team meeting Observation Level : q15 minute checks Vital signs: q12 hours Precautions: suicide, elopement, and assault   Diagnosis: Principal Problem:   Severe recurrent major depression with psychotic features ([place]) Active Problems:   History of suicide attempt   GAD (generalized anxiety disorder)   History of postpartum depression  #Severe recurrent major depression with psychotic features -Decrease escitalopram 10 mg daily to 5 mg p.o. daily for depression -Continue Risperdal 0.5 mg at bedtime for mood stability and psychosis -Discontinue Abilify 5 mg daily -Discontinue Venlafaxine XR 112.5 mg daily  #Generalized anxiety disorder -Decrease escitalopram 10 mg daily to 5 mg p.o. daily for anxiety -Continue buspirone 5 mg 3 times daily for anxiety  #Tobacco cessation -NicoDerm CQ 14 mg / 24-hour transdermal patch daily  Agitation protocol  -Olanzapine 10 mg every 8 hours as needed for agitation AND  -Lorazepam 1 mg as needed for anxiety and  severe agitation AND  -Ziprasidone 20 mg intramuscular injection as needed for agitation  As needed medications: -Patient to continue taking Tylenol 650 mg every 6 hours as needed for mild pain -Patient to continue taking Maalox/Mylanta 30 mL every 4 hours as needed for indigestion -Patient to continue taking Milk of Magnesia 30 mL as needed for mild constipation -Hydroxyzine 25 mg 3 times daily as needed for anxiety -Ondansetron 4 mg every 8 hours as needed for nausea and vomiting  Discharge Planning: Social work and case management to assist with discharge planning and identification of hospital follow-up needs prior to discharge Estimated LOS: 5-7 days Discharge Concerns: Need to establish a safety plan; Medication compliance and effectiveness Discharge Goals: Return home with outpatient referrals for mental health follow-up including medication management/psychotherapy   I certify that inpatient services furnished can reasonably be expected to improve the patient's condition.    Laretta Bolster, FNP 11/21/2022, 6:15 PMPatient ID: Melissa [name], female   DOB: [birth year], 22 y.o.   MRN: [id number] Patient ID: Melissa [name], female   DOB: Sep 15, 2000, 22 y.o.   MRN: [id number]

## 2022-11-21 NOTE — [facility] Group Notes (Signed)
[facility] Group Notes:  (Nursing/MHT/Case Management/Adjunct)  Date:  11/21/2022  Time:  11:28 AM  Type of Therapy:  Group Therapy  Participation Level:  Active  Participation Quality:  Appropriate  Affect:  Appropriate  Cognitive:  Appropriate  Insight:  Good  Engagement in Group:  Engaged  Modes of Intervention:  Discussion  Summary of Progress/Problems:  Redmond Pulling 11/21/2022, 11:28 AM

## 2022-11-22 NOTE — [facility] Group Notes (Signed)
[facility] [facility] Group Notes:  (Nursing/MHT/Case Management/Adjunct)  Date:  11/22/2022  Time:  4:17 PM  Type of Therapy:  Group Therapy  Participation Level:  Active  Participation Quality:  Appropriate  Affect:  Appropriate  Cognitive:  Appropriate  Insight:  Good  Engagement in Group:  Engaged  Modes of Intervention:  Discussion  Summary of Progress/Problems:  Redmond Pulling 11/22/2022, 4:17 PM

## 2022-11-22 NOTE — Progress Notes (Signed)
Pt stated she was doing better this evening, pt said she will utilize her coping skills moving forward to help her when she gets into crisis    11/22/22 2345  Psych Admission Type (Psych Patients Only)  Admission Status Voluntary  Psychosocial Assessment  Patient Complaints Depression  Eye Contact Fair  Facial Expression Anxious  Affect Appropriate to circumstance  Speech Logical/coherent  Interaction Assertive  Motor Activity Slow  Appearance/Hygiene Unremarkable  Behavior Characteristics Appropriate to situation  Mood Depressed  Aggressive Behavior  Effect No apparent injury  Thought Process  Coherency WDL  Content WDL  Delusions WDL  Perception WDL  Hallucination None reported or observed  Judgment Impaired  Confusion WDL  Danger to Self  Current suicidal ideation? Denies  Danger to Others  Danger to Others None reported or observed

## 2022-11-22 NOTE — Progress Notes (Signed)
   11/22/22 0629  15 Minute Checks  Location Dayroom  Visual Appearance Calm  Behavior Composed  Sleep (Behavioral Health Patients Only)  Calculate sleep? (Click Yes once per 24 hr at 0600 safety check) Yes  Documented sleep last 24 hours 7

## 2022-11-22 NOTE — Progress Notes (Cosign Needed Addendum)
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  This is patient's second time being admitted to [facility].  Patient was assessed by Garrison Columbus, NP for reevaluation.  Patient is currently being managed on the following psychiatric medications:  Lexapro 5 mg p.o. daily for depression Risperidone disintegrating tab 0.5 mg p.o. daily for psychosis Buspirone 5 mg 3 times daily for anxiety Hydroxyzine 25 mg 3 times daily as needed  Agitation protocol  -Olanzapine 10 mg every 8 hours as needed for agitation AND  -Lorazepam 1 mg as needed for anxiety and severe agitation AND  -Ziprasidone 20 mg intramuscular injection as needed for agitation  Assessment: Patient is seen and examined on [address] sitting in a chair in the office.  Melissa [name] reports she is excited as she will probably may be going home tomorrow.  Reports first thing to do is to hug my children and give them kisses.  Patient reports less depressed mood with congruent affect.  She rates her depression as #0  with 10 being most severe.  She states that her symptoms have seemingly improved and she is appreciative for the treatment she received.  Present with large smiles during this evaluation.  Patient rates anxiety and depression as #0, with 10 being of highest severity, which is much improved. Patient  visible on the unit and actively participating in therapeutic milieu  and group activities.   Patient is alert and oriented to person, place, time and situation.  Presents calm, cooperative, and fully engaged in the examination.  Patient exhibits good eye contact. Patient's speech is clear, coherent, and with normal rate and volume. Patient's thought process in coherent and goal oriented. Patient's thought content is logical.  Continues on the scheduled psychotropic medications without noted somatic discomfort.  Patient denies suicidal or homicidal ideations. She further denies auditory or visual hallucinations and does not appear to be responding to internal.  Patient denies paranoia or delusional thoughts.  Patient endorses good appetite.  Report sleeping an average 8 hours last night.  Vital signs reviewed with blood pressure of 107/77, pulse 108.  Patient remains asymptomatic.  Nursing staff to recheck vital signs.  Patient probably may be discharged tomorrow if she continues to respond well to her treatment plan.  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 last attack June [id number]   Complication of anesthesia    Congenital hydronephrosis 2001   Constipation    Depression  Episodic tension-type headache, not intractable 04/16/2015   Family history of adverse reaction to anesthesia    mom rushed to hospital from Dentist office, pt her mother &  grandmother have trouble waking fully after anesthesia   Genital herpes    Low back strain, sequela 11/10/2020   Migraine without aura and without status migrainosus, not intractable 04/16/2015   PID (pelvic inflammatory disease)    Post-partum depression 11/17/2021   Seizures ([name])    last one in 2015 - on meds   Sickle cell trait ([place])    TBI (traumatic brain injury) ([place]) 2006    Past Surgical History:  Procedure Laterality Date   APPENDECTOMY     CESAREAN SECTION N/A 08/31/2021   Procedure: CESAREAN SECTION;  Surgeon: Gwynne Edinger, MD;  Location: [facility] LD ORS;  Service: Obstetrics;  Laterality: N/A;   CESAREAN SECTION N/A 06/05/2022   Procedure: CESAREAN SECTION;  Surgeon: Chancy Milroy, MD;  Location: [facility] LD ORS;  Service: Obstetrics;  Laterality: N/A;   HERNIA REPAIR     INTESTINAL MALROTATION REPAIR  2001   Family History:  Family History  Problem Relation Age of Onset   Depression Mother    Stroke Mother    Obesity Mother    Post-traumatic stress disorder Mother    Anxiety disorder Mother    Hypertension Mother    Diabetes Mother    Schizophrenia Father    Kidney disease Father    Family Psychiatric  History:  See H&P  Social History:  Social History   Substance and Sexual Activity  Alcohol Use Not Currently   Comment: occasionally, prior to pregnancy     Social History   Substance and Sexual Activity  Drug Use Not Currently   Types: Marijuana   Comment: last 2020    Social History   Socioeconomic History   Marital status: Single    Spouse name: Not on file   Number of children: Not on file   Years of education: Not on file   Highest education level: Not on file  Occupational History   Not on file  Tobacco Use   Smoking status: Every Day    Packs/day: .25    Types: Cigarettes   Smokeless tobacco: Never   Tobacco comments:    black and milds, 2 cigs daily  Vaping Use   Vaping Use: Former  Substance and Sexual Activity   Alcohol use:  Not Currently    Comment: occasionally, prior to pregnancy   Drug use: Not Currently    Types: Marijuana    Comment: last 2020   Sexual activity: Not Currently    Partners: Male    Birth control/protection: None  Other Topics Concern   Not on file  Social History Narrative   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   She attended [employer].    She lives with her parents, siblings, and grandfather.    She enjoys writing, singing, dancing, cooking, and shopping.   She attends [employer].   Social Determinants of Health   Financial Resource Strain: Not on file  Food Insecurity: Food Insecurity Present (11/14/2022)   Hunger Vital Sign    Worried About Running Out of Food in the Last Year: Sometimes true    Ran Out of Food in the Last Year: Sometimes true  Transportation Needs: No Transportation Needs (11/14/2022)   PRAPARE - Hydrologist (Medical): No    Lack of Transportation (Non-Medical): No  Recent  Concern: Transportation Needs - Unmet Transportation Needs (11/14/2022)   PRAPARE - Hydrologist (Medical): Yes    Lack of Transportation (Non-Medical): Yes  Physical Activity: Not on file  Stress: Not on file  Social Connections: Not on file   Additional Social History:  See H&P  Sleep: Fair  Appetite:  Good  Current Medications: Current Facility-Administered Medications  Medication Dose Route Frequency Provider Last Rate Last Admin   acetaminophen (TYLENOL) tablet 650 mg  650 mg Oral Q6H PRN Scot Jun, NP   650 mg at 11/21/22 1102   albuterol (VENTOLIN HFA) 108 (90 Base) MCG/ACT inhaler 1-2 puff  1-2 puff Inhalation Q4H PRN Massengill, Ovid Curd, MD   2 puff at 11/21/22 0737   alum & mag hydroxide-simeth (MAALOX/MYLANTA) 200-200-20 MG/5ML suspension 30 mL  30 mL Oral Q4H PRN Scot Jun, NP       busPIRone (BUSPAR) tablet 5 mg  5 mg Oral TID Laretta Bolster, FNP   5 mg at 11/22/22 1158   escitalopram  (LEXAPRO) tablet 5 mg  5 mg Oral Daily Massengill, Nathan, MD   5 mg at 11/22/22 0841   hydrOXYzine (ATARAX) tablet 25 mg  25 mg Oral TID PRN Scot Jun, NP   25 mg at 11/21/22 2120   OLANZapine zydis (ZYPREXA) disintegrating tablet 10 mg  10 mg Oral Q8H PRN Scot Jun, NP       And   LORazepam (ATIVAN) tablet 1 mg  1 mg Oral PRN Scot Jun, NP       And   ziprasidone (GEODON) injection 20 mg  20 mg Intramuscular PRN Scot Jun, NP       magnesium hydroxide (MILK OF MAGNESIA) suspension 30 mL  30 mL Oral Daily PRN Scot Jun, NP   30 mL at 11/21/22 2017   multivitamins with iron tablet 1 tablet  1 tablet Oral Daily Massengill, Nathan, MD   1 tablet at 11/22/22 0841   nicotine (NICODERM CQ - dosed in mg/24 hours) patch 14 mg  14 mg Transdermal Daily Evette Georges, NP       nicotine polacrilex (NICORETTE) gum 2 mg  2 mg Oral PRN Massengill, Ovid Curd, MD   2 mg at 11/21/22 1252   ondansetron (ZOFRAN) tablet 4 mg  4 mg Oral Q8H PRN Massengill, Ovid Curd, MD   4 mg at 11/21/22 2017   risperiDONE (RISPERDAL) tablet 0.5 mg  0.5 mg Oral QHS Nwoko, Uchenna E, PA   0.5 mg at 11/21/22 2120   valACYclovir (VALTREX) tablet 1,000 mg  1,000 mg Oral BID Janine Limbo, MD   1,000 mg at 11/22/22 [id number]    Lab Results:  No results found for this or any previous visit (from the past 33 hour(s)).   Blood Alcohol level:  Lab Results  Component Value Date   ETH <10 11/14/2022   ETH <10 [id number]    Metabolic Disorder Labs: Lab Results  Component Value Date   HGBA1C 5.3 11/14/2022   MPG 105 11/14/2022   MPG 93.93 11/19/2021   Lab Results  Component Value Date   PROLACTIN 14.4 11/14/2022   PROLACTIN 8.8 01/01/2017   Lab Results  Component Value Date   CHOL 153 11/14/2022   TRIG 75 11/14/2022   HDL 37 (L) 11/14/2022   CHOLHDL 4.1 11/14/2022   VLDL 15 11/14/2022   LDLCALC 101 (H) 11/14/2022   LDLCALC 105 (H) 11/19/2021    Physical Findings: AIMS:  , ,  ,   ,  CIWA:    COWS:     Musculoskeletal: Strength & Muscle Tone: within normal limits Gait & Station: normal Patient leans: N/A  Psychiatric Specialty Exam:  Presentation  General Appearance:  Appropriate for Environment; Casual; Fairly Groomed  Eye Contact: Good  Speech: Clear and Coherent  Speech Volume: Normal  Handedness: Right  Mood and Affect  Mood: Euthymic  Affect: Appropriate; Congruent  Thought Process  Thought Processes: Coherent  Descriptions of Associations:Intact  Orientation:Full (Time, Place and Person)  Thought Content:Logical  History of Schizophrenia/Schizoaffective disorder:No data recorded  Duration of Psychotic Symptoms:No data recorded Hallucinations:Hallucinations: None Description of Auditory Hallucinations: Denies  Ideas of Reference:None  Suicidal Thoughts:Suicidal Thoughts: No SI Passive Intent and/or Plan: -- (Denies)  Homicidal Thoughts:Homicidal Thoughts: No  Sensorium  Memory: Immediate Good; Recent Good  Judgment: Good  Insight: Good  Executive Functions  Concentration: Good  Attention Span: Good  Recall: Good  Fund of Knowledge: Good  Language: Good  Psychomotor Activity  Psychomotor Activity: Psychomotor Activity: Normal  Assets  Assets: Communication Skills; Desire for Improvement; Physical Health; Resilience; Social Support  Sleep  Sleep: Sleep: Good Number of Hours of Sleep: 8  Physical Exam: Physical Exam Vitals and nursing note reviewed.  Constitutional:      Appearance: She is well-developed.  HENT:     Head: Normocephalic and atraumatic.  Eyes:     Extraocular Movements: Extraocular movements intact.  Cardiovascular:     Rate and Rhythm: Regular rhythm. Tachycardia present.     Comments: Blood pressure 98/81, pulse at 112.  Patient remains asymptomatic.  Nursing staff to recheck vital signs. Pulmonary:     Effort: Pulmonary effort is normal.  Abdominal:      General: Abdomen is flat.  Musculoskeletal:        General: Normal range of motion.     Cervical back: Normal range of motion.  Skin:    General: Skin is warm and dry.  Neurological:     General: No focal deficit present.     Mental Status: She is alert and oriented to person, place, and time.  Psychiatric:        Attention and Perception: Attention and perception normal. She does not perceive auditory or visual hallucinations.        Mood and Affect: Mood is anxious and depressed. Affect is blunt.        Speech: Speech normal.        Behavior: Behavior normal. Behavior is cooperative.        Thought Content: Thought content normal. Thought content is not paranoid or delusional. Thought content does not include homicidal or suicidal ideation.        Cognition and Memory: Cognition and memory normal.        Judgment: Judgment normal.    Review of Systems  Constitutional: Negative.   HENT: Negative.    Eyes: Negative.   Respiratory: Negative.    Cardiovascular: Negative.        Blood pressure 98/81, pulse at 112.  Patient remains asymptomatic.  Nursing staff to recheck vital signs   Gastrointestinal: Negative.   Skin: Negative.   Neurological: Negative.   Psychiatric/Behavioral:  Positive for depression. Negative for hallucinations, substance abuse and suicidal ideas. The patient is nervous/anxious.    Blood pressure 107/77, pulse (!) 108, temperature 98.2 F (36.8 C), temperature source Oral, resp. rate 16, height 5\' 7"  (1.702 m), weight 111.1 kg, last menstrual period 11/08/2022, SpO2 99 %, unknown if currently breastfeeding. Body mass index  is 38.37 kg/m.   Treatment Plan Summary:  Daily contact with patient to assess and evaluate symptoms and progress in treatment and Medication management  Plan:  During the assessment, patient agreed to rescind her 72-hour request for discharge.  Patient refuses to take Effexor or Abilify stating that Effexor made her experience  hallucinations characterized by walls closing in on her and her legs shaking.  Patient continues to experience depression and anxiety and is open to other options for medication management.  Provider discussed with patient being placed on escitalopram 10 mg daily for the management of her depressive symptoms.  Patient was also recommended Risperdal 0.5 mg at bedtime for mood stability.  Patient was agreeable to recommendations.  Safety and Monitoring: Voluntary admission to inpatient psychiatric unit for safety, stabilization and treatment Daily contact with patient to assess and evaluate symptoms and progress in treatment Patient's case to be discussed in multi-disciplinary team meeting Observation Level : q15 minute checks Vital signs: q12 hours Precautions: suicide, elopement, and assault   Diagnosis: Principal Problem:   Severe recurrent major depression with psychotic features ([place]) Active Problems:   History of suicide attempt   GAD (generalized anxiety disorder)   History of postpartum depression  #Severe recurrent major depression with psychotic features -Decrease escitalopram 10 mg daily to 5 mg p.o. daily for depression -Continue Risperdal 0.5 mg at bedtime for mood stability and psychosis -Discontinue Abilify 5 mg daily -Discontinue Venlafaxine XR 112.5 mg daily  #Generalized anxiety disorder -Decrease escitalopram 10 mg daily to 5 mg p.o. daily for anxiety -Continue buspirone 5 mg 3 times daily for anxiety  #Tobacco cessation -NicoDerm CQ 14 mg / 24-hour transdermal patch daily  Agitation protocol  -Olanzapine 10 mg every 8 hours as needed for agitation AND  -Lorazepam 1 mg as needed for anxiety and severe agitation AND  -Ziprasidone 20 mg intramuscular injection as needed for agitation  As needed medications: -Patient to continue taking Tylenol 650 mg every 6 hours as needed for mild pain -Patient to continue taking Maalox/Mylanta 30 mL every 4 hours as needed for  indigestion -Patient to continue taking Milk of Magnesia 30 mL as needed for mild constipation -Hydroxyzine 25 mg 3 times daily as needed for anxiety -Ondansetron 4 mg every 8 hours as needed for nausea and vomiting  Discharge Planning: Social work and case management to assist with discharge planning and identification of hospital follow-up needs prior to discharge Estimated LOS: 5-7 days Discharge Concerns: Need to establish a safety plan; Medication compliance and effectiveness Discharge Goals: Return home with outpatient referrals for mental health follow-up including medication management/psychotherapy   I certify that inpatient services furnished can reasonably be expected to improve the patient's condition.    Laretta Bolster, FNP 11/22/2022, 1:00 PMPatient ID: Melissa [name], female   DOB: January 28, 2001, 22 y.o.   MRN: [id number] Patient ID: Melissa [name], female   DOB: 21-Feb-2001, 22 y.o.   MRN: [id number] Patient ID: Melissa [name], female   DOB: [birth year], 22 y.o.   MRN: [id number]

## 2022-11-22 NOTE — Group Note (Signed)
Recreation Therapy Group Note   Group Topic:Other  Group Date: 11/22/2022 Start Time: 1400 End Time: 1440 Facilitators: Jazaria Jarecki-McCall, LRT,CTRS Location: [address] Dayroom   Activity Description/Intervention: Therapeutic Drumming. Patients with peers and staff were given the opportunity to engage in a leader facilitated [place] with staff from the [employer], in partnership with [employer]. Nurse, adult and trained [employer], Devin Going leading with LRT observing and documenting intervention and pt response. This evidenced-based practice targets 7 areas of health and wellbeing in the human experience including: stress-reduction, exercise, self-expression, camaraderie/support, nurturing, spirituality, and music-making (leisure).   Goal Area(s) Addresses:  Patient will engage in pro-social way in music group.  Patient will follow directions of drum leader on the first prompt. Patient will identify if a reduction in stress level occurs as a result of participation in therapeutic drum circle.     Affect/Mood: N/A   Participation Level: Did not attend    Clinical Observations/Individualized Feedback:     Plan: Continue to engage patient in RT group sessions 2-3x/week.   Demetric Dunnaway-McCall, LRT,CTRS 11/22/2022 3:16 PM

## 2022-11-22 NOTE — Progress Notes (Signed)
   11/22/22 1000  Psych Admission Type (Psych Patients Only)  Admission Status Voluntary  Psychosocial Assessment  Patient Complaints Depression  Eye Contact Fair  Facial Expression Anxious  Affect Appropriate to circumstance  Speech Logical/coherent  Interaction Assertive  Motor Activity Slow  Appearance/Hygiene Unremarkable  Behavior Characteristics Appropriate to situation  Mood Depressed  Thought Process  Coherency WDL  Content WDL  Delusions None reported or observed  Perception WDL  Hallucination None reported or observed  Judgment Impaired  Confusion None  Danger to Self  Current suicidal ideation? Denies  Danger to Others  Danger to Others None reported or observed

## 2022-11-22 NOTE — Progress Notes (Signed)
   11/21/22 2200  Psych Admission Type (Psych Patients Only)  Admission Status Voluntary  Psychosocial Assessment  Patient Complaints Anxiety  Eye Contact Fair  Facial Expression Anxious  Affect Appropriate to circumstance  Speech Logical/coherent  Interaction Assertive  Motor Activity Slow  Appearance/Hygiene Unremarkable  Behavior Characteristics Appropriate to situation  Mood Depressed;Anxious  Thought Process  Coherency WDL  Content WDL  Delusions None reported or observed  Perception WDL  Hallucination None reported or observed  Judgment Impaired  Confusion None  Danger to Self  Current suicidal ideation? Denies  Self-Injurious Behavior No self-injurious ideation or behavior indicators observed or expressed   Agreement Not to Harm Self Yes  Description of Agreement verbal  Danger to Others  Danger to Others None reported or observed

## 2022-11-23 DIAGNOSIS — F429 Obsessive-compulsive disorder, unspecified: Secondary | ICD-10-CM | POA: Insufficient documentation

## 2022-11-23 MED ORDER — ESCITALOPRAM OXALATE 5 MG PO TABS: 5.0000 mg | ORAL_TABLET | Freq: Every day | ORAL | 0 refills | Status: DC

## 2022-11-23 MED ORDER — BUSPIRONE HCL 5 MG PO TABS: 5.0000 mg | ORAL_TABLET | Freq: Three times a day (TID) | ORAL | 0 refills | Status: AC

## 2022-11-23 MED ORDER — RISPERIDONE 0.5 MG PO TABS: 0.5000 mg | ORAL_TABLET | Freq: Every day | ORAL | 0 refills | Status: DC

## 2022-11-23 MED ORDER — HYDROXYZINE HCL 25 MG PO TABS: 25.0000 mg | ORAL_TABLET | Freq: Three times a day (TID) | ORAL | 0 refills | Status: DC | PRN

## 2022-11-23 MED ORDER — NICOTINE 14 MG/24HR TD PT24: 14.0000 mg | MEDICATED_PATCH | Freq: Every day | TRANSDERMAL | 0 refills | Status: DC
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
## 2022-11-23 NOTE — Discharge Summary (Signed)
Physician Discharge Summary Note  Patient:  Melissa [name] is an 22 y.o., female MRN:  [id number] DOB:  [birth year] Patient phone:  [phone] (home)  Patient address:   [place] [address],  Total Time spent with patient: 30 minutes  Date of Admission:  11/14/2022 Date of Discharge:   11/23/2022  Reason for Admission:   This is the second admission of this 22 year old African-American female to [facility]. 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 Current/prior outpatient treatment: Yes, at [facility] Prior rehab hx: Denies Psychotherapy hx: Yes History of suicide: Yes History of homicide or aggression: Denies Psychiatric medication history: Yes, was prescribed Effexor, Abilify, hydroxyzine. Psychiatric medication compliance history: Yes, except during pregnancy from April 2023 to current Neuromodulation history:  Denies Current Psychiatrist: Denies Current therapist: Denies  Past Medical History:  Past Medical History:  Diagnosis Date   Anemia    Anxiety    Asthma    inhaler. last attack June [id number]   Complication of anesthesia    Congenital hydronephrosis 2001   Constipation    Depression    Episodic tension-type headache, not intractable 04/16/2015   Family history of adverse reaction to anesthesia    mom rushed to hospital from Dentist office, pt her mother & grandmother have trouble waking fully after anesthesia   Genital herpes    Low back strain, sequela 11/10/2020   Migraine without aura and without status migrainosus, not intractable 04/16/2015   PID (pelvic inflammatory disease)    Post-partum depression 11/17/2021   Seizures ([place])    last one in 2015 - on meds   Sickle cell trait ([name])    TBI (traumatic brain injury) ([place]) 2006    Past Surgical History:  Procedure Laterality Date   APPENDECTOMY     CESAREAN SECTION N/A 08/31/2021   Procedure: CESAREAN SECTION;  Surgeon: Gwynne Edinger, MD;  Location: [facility] LD ORS;  Service: Obstetrics;  Laterality: N/A;   CESAREAN SECTION N/A 06/05/2022   Procedure: CESAREAN SECTION;  Surgeon: Chancy Milroy, MD;  Location: [facility] LD ORS;  Service: Obstetrics;  Laterality: N/A;   HERNIA REPAIR     INTESTINAL MALROTATION REPAIR  2001   Family History:  Family History  Problem Relation Age of Onset   Depression Mother    Stroke Mother    Obesity Mother    Post-traumatic stress disorder Mother    Anxiety disorder Mother    Hypertension Mother    Diabetes Mother    Schizophrenia Father    Kidney disease Father    Family Psychiatric  History:  Psych: Father has history of schizophrenia  Psych Rx: Yes SA/HA: Denies Substance use family hx: Patient denies    Social History:  Social History   Substance and Sexual Activity  Alcohol Use Not Currently   Comment: occasionally, prior to pregnancy     Social History   Substance and  Sexual Activity  Drug Use Not Currently   Types: Marijuana   Comment: last 2020    Social History   Socioeconomic History   Marital status: Single    Spouse name: Not on file   Number of children: Not on file   Years of education: Not on file   Highest education level: Not on file  Occupational History   Not on file  Tobacco Use   Smoking status: Every Day    Packs/day: .25    Types: Cigarettes   Smokeless tobacco: Never   Tobacco comments:    black and milds, 2 cigs daily  Vaping Use   Vaping Use: Former  Substance and Sexual Activity   Alcohol use: Not Currently    Comment: occasionally, prior to pregnancy   Drug use: Not Currently    Types: Marijuana    Comment: last 2020   Sexual activity: Not Currently    Partners: Male    Birth control/protection: None  Other Topics Concern   Not on file  Social History Narrative   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   She attended [employer].    She lives with her parents, siblings, and grandfather.    She enjoys writing, singing, dancing, cooking, and shopping.   She attends [employer].   Social Determinants of Health   Financial Resource Strain: Not on file  Food Insecurity: Food Insecurity Present (11/14/2022)   Hunger Vital Sign    Worried About Running Out of Food in the Last Year: Sometimes true    Ran Out of Food in the Last Year: Sometimes true  Transportation Needs: No Transportation Needs (11/14/2022)   PRAPARE - Hydrologist (Medical): No    Lack of Transportation (Non-Medical): No  Recent Concern: Transportation Needs - Unmet Transportation Needs (11/14/2022)   PRAPARE - Hydrologist (Medical): Yes    Lack of Transportation (Non-Medical): Yes  Physical Activity: Not on file  Stress: Not on file  Social Connections: Not on file    [place]:  During the patient's hospitalization, patient had extensive initial  psychiatric evaluation, and follow-up psychiatric evaluations every day.  Psychiatric diagnoses provided upon initial assessment:  Severe recurrent major depression with psychotic features  Patient's psychiatric medications were adjusted on admission:  Venlafaxine XR (Effexor-XR) 24-hour capsule 75 mg p.o. daily with breakfast for depression Agitation protocol: Olanzapine Zyprexa disintegrating tab 10 mg p.o. every 8 hours as needed agitation and Lorazepam Ativan tablet 1 mg p.o. as needed anxiety, severe agitation and Ziprasidone Geodon injection 20 mg IM as needed agitation   During the hospitalization, other adjustments were made to the patient's psychiatric medication regimen:  Discontinued Abilify due to gambling tendencies Buspirone BuSpar tablet 5 mg p.o. 3 times daily for anxiety Lexapro tablet 5 mg p.o. daily for depression Risperidone Risperdal tablet 0.5 mg p.o. daily at bedtime for psychosis  Patient's care was discussed during the interdisciplinary team meeting every day during the hospitalization.  The patient  having side effects to prescribed psychiatric medication.  Gradually, patient started adjusting to milieu. The patient was evaluated each day by a clinical provider to ascertain response  to treatment. Improvement was noted by the patient's report of decreasing symptoms, improved sleep and appetite, affect, medication tolerance, behavior, and participation in unit programming.  Patient was asked each day to complete a self inventory noting mood, mental status, pain, new symptoms, anxiety and concerns.    Symptoms were reported as significantly decreased or resolved completely by discharge.   On day of discharge, the patient reports that their mood is stable. The patient denied having suicidal thoughts for more than 48 hours prior to discharge.  Patient denies having homicidal thoughts.  Patient denies having auditory hallucinations.  Patient denies any visual  hallucinations or other symptoms of psychosis. The patient was motivated to continue taking medication with a goal of continued improvement in mental health.   The patient reports their target psychiatric symptoms of depression responded well to the psychiatric medications, and the patient reports overall benefit other psychiatric hospitalization. Supportive psychotherapy was provided to the patient. The patient also participated in regular group therapy while hospitalized. Coping skills, problem solving as well as relaxation therapies were also part of the unit programming.  Labs were reviewed with the patient, and abnormal results were discussed with the patient.  The patient is able to verbalize their individual safety plan to this provider.  # It is recommended to the patient to continue psychiatric medications as prescribed, after discharge from the hospital.    # It is recommended to the patient to follow up with your outpatient psychiatric provider and PCP.  # It was discussed with the patient, the impact of alcohol, drugs, tobacco have been there overall psychiatric and medical wellbeing, and total abstinence from substance use was recommended the patient.ed.  # Prescriptions provided or sent directly to preferred pharmacy at discharge. Patient agreeable to plan. Given opportunity to ask questions. Appears to feel comfortable with discharge.    # In the event of worsening symptoms, the patient is instructed to call the crisis hotline, 911 and or go to the nearest ED for appropriate evaluation and treatment of symptoms. To follow-up with primary care provider for other medical issues, concerns and or health care needs  # Patient was discharged to home with a plan to follow up as noted below.  Physical Findings: AIMS:  , ,  ,  ,    CIWA:    COWS:     Musculoskeletal: Strength & Muscle Tone: within normal limits Gait & Station: normal Patient leans: N/A  Psychiatric Specialty  Exam:  Presentation  General Appearance:  Appropriate for Environment; Casual; Fairly Groomed  Eye Contact: Good  Speech: Normal Rate; Clear and Coherent  Speech Volume: Normal  Handedness: Right  Mood and Affect  Mood: Euthymic  Affect: Appropriate; Congruent; Full Range  Thought Process  Thought Processes: Linear  Descriptions of Associations:Intact  Orientation:Full (Time, Place and Person)  Thought Content:Logical  History of Schizophrenia/Schizoaffective disorder: None  Duration of Psychotic Symptoms: 1 week  Hallucinations:Hallucinations: None Description of Auditory Hallucinations: Denies  Ideas of Reference:None  Suicidal Thoughts:Suicidal Thoughts: No SI Passive Intent and/or Plan: -- (denies)  Homicidal Thoughts:Homicidal Thoughts: No  Sensorium  Memory: Immediate Good; Recent Good; Remote Good  Judgment: Good  Insight: Good  Executive Functions  Concentration: Good  Attention Span: Good  Recall: Good  Fund of Knowledge: Good  Language: Good  Psychomotor Activity  Psychomotor Activity: Psychomotor Activity: Normal  Assets  Assets: Communication Skills; Desire for Improvement; Physical Health; Resilience; Social Support  Sleep  Sleep: Sleep: Fair Number of Hours of Sleep:  9  Physical Exam: Physical Exam Vitals and nursing note reviewed.  HENT:     Head: Normocephalic.  Cardiovascular:     Rate and Rhythm: Tachycardia present.     Comments: Blood pressure 117/78, pulse 115.  Patient remains asymptomatic.  Nursing staff to recheck vital signs. Pulmonary:     Effort: Pulmonary effort is normal.  Abdominal:     Palpations: Abdomen is soft.  Musculoskeletal:        General: Normal range of motion.     Cervical back: Normal range of motion.  Skin:    General: Skin is warm.  Neurological:     General: No focal deficit present.     Mental Status: She is alert and oriented to person, place, and time.   Psychiatric:        Mood and Affect: Mood normal.        Behavior: Behavior normal.    Review of Systems  Constitutional: Negative.   HENT: Negative.    Eyes: Negative.   Respiratory: Negative.    Cardiovascular: Negative.        Blood pressure 117/78, pulse 115.  Patient remains asymptomatic.  Nursing staff to recheck vital signs.   Gastrointestinal: Negative.   Genitourinary: Negative.   Musculoskeletal: Negative.   Neurological:  Positive for seizures (History of seizures, last seizure was 2015).  Endo/Heme/Allergies: Negative.        See allergy listing  Psychiatric/Behavioral:  Positive for depression (Improved with medication). The patient is nervous/anxious (Improved with medication) and has insomnia (Improved with medication).    Blood pressure 117/78, pulse (!) 115, temperature 98.3 F (36.8 C), temperature source Oral, resp. rate 18, height 5\' 7"  (1.702 m), weight 111.1 kg, last menstrual period 11/08/2022, SpO2 100 %, unknown if currently breastfeeding. Body mass index is 38.37 kg/m.   Social History   Tobacco Use  Smoking Status Every Day   Packs/day: .25   Types: Cigarettes  Smokeless Tobacco Never  Tobacco Comments   black and milds, 2 cigs daily   Tobacco Cessation:  A prescription for an FDA-approved tobacco cessation medication provided at discharge   Blood Alcohol level:  Lab Results  Component Value Date   [facility] <10 11/14/2022   ETH <10 [id number]    Metabolic Disorder Labs:  Lab Results  Component Value Date   HGBA1C 5.3 11/14/2022   MPG 105 11/14/2022   MPG 93.93 11/19/2021   Lab Results  Component Value Date   PROLACTIN 14.4 11/14/2022   PROLACTIN 8.8 01/01/2017   Lab Results  Component Value Date   CHOL 153 11/14/2022   TRIG 75 11/14/2022   HDL 37 (L) 11/14/2022   CHOLHDL 4.1 11/14/2022   VLDL 15 11/14/2022   LDLCALC 101 (H) 11/14/2022   LDLCALC 105 (H) 11/19/2021    See Psychiatric Specialty Exam and Suicide Risk Assessment  completed by Attending Physician prior to discharge.  Discharge destination:  Home  Is patient on multiple antipsychotic therapies at discharge:  No   Has Patient had three or more failed trials of antipsychotic monotherapy by history:  No  Recommended Plan for Multiple Antipsychotic Therapies: NA  Discharge Instructions     Diet - low sodium heart healthy   Complete by: As directed    Increase activity slowly   Complete by: As directed       Allergies as of 11/23/2022       Reactions   Benadryl [diphenhydramine Hcl] Other (See Comments)   Causes seizures  Gluten Meal Other (See Comments)   GI Intolerance   Shellfish Allergy Anaphylaxis, Other (See Comments)   Patient states her throat gets tight   Zithromax [azithromycin] Anaphylaxis, Swelling   Benadryl Anti-itch Childrens [camphor]    Haldol [haloperidol] Swelling, Other (See Comments)   Tongue swelling.   Haloperidol And Related    Lactose Intolerance (gi) Diarrhea   Latex Swelling, Other (See Comments)   Labial edema after usage of latex condoms.         Medication List     STOP taking these medications    cyclobenzaprine 10 MG tablet Commonly known as: FLEXERIL   ibuprofen 800 MG tablet Commonly known as: ADVIL   loratadine 10 MG tablet Commonly known as: CLARITIN       TAKE these medications      Indication  albuterol 108 (90 Base) MCG/ACT inhaler Commonly known as: VENTOLIN HFA Inhale 1 puff into the lungs 4 (four) times daily as needed for wheezing or shortness of breath.  Indication: Asthma   busPIRone 5 MG tablet Commonly known as: BUSPAR Take 1 tablet (5 mg total) by mouth 3 (three) times daily.  Indication: Anxiety Disorder, Major Depressive Disorder   escitalopram 5 MG tablet Commonly known as: LEXAPRO Take 1 tablet (5 mg total) by mouth daily. Start taking on: November 24, 2022  Indication: Major Depressive Disorder, Obsessive Compulsive Disorder   ferrous sulfate 325 (65 FE) MG  tablet Take 1 tablet (325 mg total) by mouth at bedtime. What changed: when to take this  Indication: Iron Deficiency   hydrOXYzine 25 MG tablet Commonly known as: ATARAX Take 1 tablet (25 mg total) by mouth 3 (three) times daily as needed for anxiety.  Indication: Feeling Anxious   medroxyPROGESTERone Acetate 150 MG/ML Susy Inject 150 mg into the muscle every 3 (three) months.  Indication: Birth Control Treatment   nicotine 14 mg/24hr patch Commonly known as: NICODERM CQ - dosed in mg/24 hours Place 1 patch (14 mg total) onto the skin daily. Start taking on: November 24, 2022  Indication: Nicotine Addiction   risperiDONE 0.5 MG tablet Commonly known as: RISPERDAL Take 1 tablet (0.5 mg total) by mouth at bedtime.  Indication: Major Depressive Disorder   valACYclovir 1000 MG tablet Commonly known as: VALTREX Take 1 tablet (1,000 mg total) by mouth 2 (two) times daily.  Indication: Herpes Simplex Infection        Follow-up Information     [facility] Follow up.   Specialty: Behavioral Health Why: Please go to this provider for an assessment, to obtain therapy and medication management services on Monday through Friday, arrive by 7:20 am as assessments are first come, first served. Contact information: [place] [phone]                Follow-up recommendations:   The patient is being discharged with her family. Patient is to take her discharge medications as ordered.  See follow up above. We recommend that she participates in individual therapy to target uncontrollable agitation and substance abuse.  We recommend that she participates in family therapy to target the conflict with his family, to improve communication skills and conflict resolution skills.  Family is to initiate/implement a contingency based behavioral model to address patient's behavior. We recommend that she get AIMS scale, height,  weight, blood pressure, fasting lipid panel, fasting blood sugar in three months from discharge as he's on atypical antipsychotics.  Patient will benefit from  monitoring of recurrent suicidal ideation since patient is on antidepressant medication. The patient should abstain from all illicit substances and alcohol. If the patient's symptoms worsen or do not continue to improve or if the patient becomes actively suicidal or homicidal then it is recommended that the patient return to the closest hospital emergency room or call 911 for further evaluation and treatment. National Suicide Prevention Lifeline 1800-SUICIDE or [phone]. Please follow up with your primary medical doctor for all other medical needs.  The patient has been educated on the possible side effects to medications and she/her guardian is to contact a medical professional and inform outpatient provider of any new side effects of medication. She is to take regular diet and activity as tolerated.  Will benefit from moderate daily exercise. Family was educated about removing/locking any firearms, medications or dangerous products from the home.  Activity:  As tolerated Diet:  Regular no added salt  Signed: Laretta Bolster, FNP 11/23/2022, 10:05 AM

## 2022-11-23 NOTE — Progress Notes (Signed)
Pt discharged to lobby. Pt was stable and appreciative at that time. All papers, samples and prescriptions were given and valuables returned. Verbal understanding expressed. Denies SI/HI and A/VH. Pt given opportunity to express concerns and ask questions.  

## 2022-11-23 NOTE — Progress Notes (Signed)
  [facility] Adult Case Management Discharge Plan :  Will you be returning to the same living situation after discharge:  No. Grandmother At discharge, do you have transportation home?: Yes,  Grandmother Do you have the ability to pay for your medications: Yes,  Insured  Release of information consent forms completed and in the chart;  Patient's signature needed at discharge.  Patient to Follow up at:  [facility].   Specialty: Behavioral Health Why: Please go to this provider for an assessment, to obtain therapy and medication management services on Monday through Friday, arrive by 7:20 am as assessments are first come, first served. Contact information: [place] [phone]                Next level of care provider has access to [facility] and Suicide Prevention discussed: Yes,  -Georgana Curio (Mother) [phone]     Has patient been referred to the Quitline?: Patient refused referral  Patient has been referred for addiction treatment: N/A Patient to continue working towards treatment goals after discharge. Patient no longer meets criteria for inpatient criteria per attending physician. Continue taking medications as prescribed, nursing to provide instructions at discharge. Follow up with all scheduled appointments.   Wesam Gearhart S Corda Shutt, LCSW 11/23/2022, 9:29 AM

## 2022-11-23 NOTE — Group Note (Signed)
Date:  11/23/2022 Time:  9:44 AM  Group Topic/Focus:  Orientation:   The focus of this group is to educate the patient on the purpose and policies of crisis stabilization and provide a format to answer questions about their admission.  The group details unit policies and expectations of patients while admitted.    Participation Level:  Active  Participation Quality:  Appropriate  Affect:  Appropriate  Cognitive:  Appropriate  Insight: Appropriate  Engagement in Group:  Improving  Modes of Intervention:  Discussion  Additional Comments:  Pt wants to get ready for discharge planning  [name] 11/23/2022, 9:44 AM

## 2022-11-23 NOTE — Discharge Instructions (Signed)
-  Follow-up with your outpatient psychiatric provider -instructions on appointment date, time, and address (location) are provided to you in discharge paperwork.  -Take your psychiatric medications as prescribed at discharge - instructions are provided to you in the discharge paperwork  -Follow-up with outpatient primary care doctor and other specialists -for management of preventative medicine and any chronic medical disease.  -Recommend abstinence from alcohol, tobacco, and other illicit drug use at discharge.   -If your psychiatric symptoms recur, worsen, or if you have side effects to your psychiatric medications, call your outpatient psychiatric provider, 911, [phone] or go to the nearest emergency department.  -If suicidal thoughts occur, call your outpatient psychiatric provider, 911, [phone] or go to the nearest emergency department.  Naloxone (Narcan) can help reverse an overdose when given to the victim quickly.  [place] offers free naloxone kits and instructions/training on its use.  Add naloxone to your first aid kit and you can help save a life.   Pick up your free kit at the following locations:   [place]:  [facility], [address] [place] [address] ([phone]) [facility] [address] [place] [id number] ([phone]) [facility] [address] [place] [address]  [place]: [facility] [address] [place] [address] ([phone]) [facility] [address] [place] [address] ([phone])  

## 2022-11-25 ENCOUNTER — Emergency Department (HOSPITAL_COMMUNITY): Payer: 59

## 2022-11-25 ENCOUNTER — Other Ambulatory Visit: Payer: Self-pay

## 2022-11-25 ENCOUNTER — Emergency Department (HOSPITAL_COMMUNITY)
Admission: EM | Admit: 2022-11-25 | Discharge: 2022-11-25 | Discharge status: Against Medical Advice | Payer: 59 | Attending: Emergency Medicine | Admitting: Emergency Medicine

## 2022-11-25 DIAGNOSIS — R0789 Other chest pain: Secondary | ICD-10-CM | POA: Diagnosis not present

## 2022-11-25 DIAGNOSIS — R0602 Shortness of breath: Secondary | ICD-10-CM | POA: Insufficient documentation

## 2022-11-25 DIAGNOSIS — Z91040 Latex allergy status: Secondary | ICD-10-CM | POA: Insufficient documentation

## 2022-11-25 DIAGNOSIS — J45909 Unspecified asthma, uncomplicated: Secondary | ICD-10-CM | POA: Diagnosis not present

## 2022-11-25 DIAGNOSIS — Z7951 Long term (current) use of inhaled steroids: Secondary | ICD-10-CM | POA: Insufficient documentation

## 2022-11-25 DIAGNOSIS — Z5329 Procedure and treatment not carried out because of patient's decision for other reasons: Secondary | ICD-10-CM | POA: Insufficient documentation

## 2022-11-25 DIAGNOSIS — F419 Anxiety disorder, unspecified: Secondary | ICD-10-CM | POA: Insufficient documentation

## 2022-11-25 DIAGNOSIS — R002 Palpitations: Secondary | ICD-10-CM | POA: Insufficient documentation

## 2022-11-25 DIAGNOSIS — R079 Chest pain, unspecified: Secondary | ICD-10-CM | POA: Diagnosis not present

## 2022-11-25 DIAGNOSIS — I10 Essential (primary) hypertension: Secondary | ICD-10-CM | POA: Diagnosis not present

## 2022-11-25 NOTE — ED Provider Notes (Signed)
[place] EMERGENCY DEPARTMENT AT [facility] Provider Note   CSN: [id number] Arrival date & time: 11/25/22  1748     History {Add pertinent medical, surgical, social history, OB history to HPI:1} Chief Complaint  Patient presents with  . Panic Attack    Melissa [name] is a 22 y.o. 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  It does not radiate and stays in the left side of her chest.  She is also complaining of heart palpitations.  Patient was having some wheezing and shortness of breath earlier, she has taken a total of 3 albuterol treatments today.  She states that she normally takes 3 to 4-day. She was recently discharged from behavioral health 2 days ago and given 2 new medications, BuSpar and risperidone.  She has tolerated these medications for a week Denies nausea, vomiting, headache, dizziness, abdominal pain, diarrhea.  HPI     Home Medications Prior to Admission medications   Medication Sig Start Date End Date Taking? Authorizing Provider  albuterol (VENTOLIN HFA) 108 (90 Base) MCG/ACT inhaler Inhale 1 puff into the lungs 4 (four) times daily as needed for wheezing or shortness of breath.    [provider]  busPIRone (BUSPAR) 5 MG tablet Take 1 tablet (5 mg total) by mouth 3 (three) times daily. 11/23/22 12/23/22  Massengill, Harrold Donath, MD  escitalopram (LEXAPRO) 5 MG tablet Take 1 tablet (5 mg total) by mouth daily. 11/24/22 12/24/22  Massengill, Harrold Donath, MD  ferrous sulfate 325 (65 FE) MG tablet Take 1 tablet (325 mg total) by mouth at bedtime. Patient taking differently: Take 325 mg by mouth every other day. 04/14/22   Marylene Land, CNM  hydrOXYzine (ATARAX) 25 MG tablet Take 1 tablet (25 mg total) by mouth 3 (three) times daily as needed for anxiety. 11/23/22   Massengill, Harrold Donath, MD  medroxyPROGESTERone Acetate 150 MG/ML  SUSY Inject 150 mg into the muscle every 3 (three) months. 08/08/22   [provider]  nicotine (NICODERM CQ - DOSED IN MG/24 HOURS) 14 mg/24hr patch Place 1 patch (14 mg total) onto the skin daily. 11/24/22   Massengill, Harrold Donath, MD  risperiDONE (RISPERDAL) 0.5 MG tablet Take 1 tablet (0.5 mg total) by mouth at bedtime. 11/23/22 12/23/22  Massengill, Harrold Donath, MD  valACYclovir (VALTREX) 1000 MG tablet Take 1 tablet (1,000 mg total) by mouth 2 (two) times daily. 05/22/22   Armando Reichert, CNM  rizatriptan (MAXALT-MLT) 10 MG disintegrating tablet Take 1 tablet at onset of migraine with 2 ibuprofen may repeat an additional tablet in 2 hours if needed 05/07/20 06/09/20  Deetta Perla, MD      Allergies    Benadryl [diphenhydramine hcl], Gluten meal, Shellfish allergy, Zithromax [azithromycin], Benadryl anti-itch childrens [camphor], Haldol [haloperidol], Haloperidol and related, Lactose intolerance (gi), and Latex    Review of Systems   Review of Systems  Physical Exam Updated Vital Signs BP 118/77 (BP Location: Left Arm)   Pulse 96   Temp 97.9 F (36.6 C) (Oral)   Resp 18   LMP 11/08/2022   SpO2 100%  Physical Exam Vitals and nursing note reviewed.  Constitutional:      General: She is not in acute distress.    Appearance: She is well-developed.  HENT:     Head: Normocephalic and atraumatic.  Eyes:     Conjunctiva/sclera: Conjunctivae normal.  Cardiovascular:  Rate and Rhythm: Normal rate and regular rhythm.     Heart sounds: No murmur heard. Pulmonary:     Effort: Pulmonary effort is normal. No respiratory distress.     Breath sounds: Normal breath sounds.  Abdominal:     Palpations: Abdomen is soft.     Tenderness: There is no abdominal tenderness.  Musculoskeletal:        General: No swelling.     Cervical back: Neck supple.  Skin:    General: Skin is warm and dry.     Capillary Refill: Capillary refill takes less than 2 seconds.  Neurological:     Mental  Status: She is alert.  Psychiatric:        Mood and Affect: Mood normal.    ED Results / Procedures / Treatments   Labs (all labs ordered are listed, but only abnormal results are displayed) Labs Reviewed  CBC WITH DIFFERENTIAL/PLATELET  BASIC METABOLIC PANEL  I-STAT BETA HCG BLOOD, ED ([facility], [facility], [facility] ONLY)  TROPONIN I (HIGH SENSITIVITY)  TROPONIN I (HIGH SENSITIVITY)    EKG None  Radiology DG Chest 1 View  Result Date: 11/25/2022 CLINICAL DATA:  [id number] Chest pain [id number] EXAM: CHEST  1 VIEW COMPARISON:  November 19, 2022 FINDINGS: The cardiomediastinal silhouette is normal in contour. No pleural effusion. No pneumothorax. No acute pleuroparenchymal abnormality. Visualized abdomen is unremarkable. IMPRESSION: No acute cardiopulmonary abnormality. Electronically Signed   By: Meda Klinefelter M.D.   On: 11/25/2022 19:00    Procedures Procedures  {Document cardiac monitor, telemetry assessment procedure when appropriate:1}  Medications Ordered in ED Medications - No data to display  ED Course/ Medical Decision Making/ A&P   {   Click here for ABCD2, HEART and other calculatorsREFRESH Note before signing :1}                          Medical Decision Making Amount and/or Complexity of Data Reviewed Labs: ordered. Radiology: ordered.   ***  {Document critical care time when appropriate:1} {Document review of labs and clinical decision tools ie heart score, Chads2Vasc2 etc:1}  {Document your independent review of radiology images, and any outside records:1} {Document your discussion with family members, caretakers, and with consultants:1} {Document social determinants of health affecting pt's care:1} {Document your decision making why or why not admission, treatments were needed:1} Final Clinical Impression(s) / ED Diagnoses Final diagnoses:  None    Rx / DC Orders ED Discharge Orders     None

## 2022-11-25 NOTE — ED Triage Notes (Signed)
[facility] reports pt coming from home c/o anxiety with chest pain. Pt recently DC from [facility] with new anxiety meds.

## 2022-11-29 ENCOUNTER — Emergency Department (HOSPITAL_COMMUNITY)
Admission: EM | Admit: 2022-11-29 | Discharge: 2022-11-29 | Disposition: A | Discharge status: Home / Self Care | Payer: 59 | Attending: Emergency Medicine | Admitting: Emergency Medicine

## 2022-11-29 ENCOUNTER — Other Ambulatory Visit: Payer: Self-pay

## 2022-11-29 ENCOUNTER — Encounter (HOSPITAL_COMMUNITY): Payer: Self-pay

## 2022-11-29 DIAGNOSIS — Z91040 Latex allergy status: Secondary | ICD-10-CM | POA: Insufficient documentation

## 2022-11-29 DIAGNOSIS — Z5900 Homelessness unspecified: Secondary | ICD-10-CM | POA: Insufficient documentation

## 2022-11-29 DIAGNOSIS — R519 Headache, unspecified: Secondary | ICD-10-CM | POA: Diagnosis not present

## 2022-11-29 DIAGNOSIS — R0789 Other chest pain: Secondary | ICD-10-CM | POA: Diagnosis not present

## 2022-11-29 DIAGNOSIS — R079 Chest pain, unspecified: Secondary | ICD-10-CM | POA: Insufficient documentation

## 2022-11-29 LAB — CBC
HCT: 40.1 % (ref 36.0–46.0)
Hemoglobin: 13.1 g/dL (ref 12.0–15.0)
MCH: 25.6 pg — ABNORMAL LOW (ref 26.0–34.0)
MCHC: 32.7 g/dL (ref 30.0–36.0)
MCV: 78.5 fL — ABNORMAL LOW (ref 80.0–100.0)
Platelets: 308 10*3/uL (ref 150–400)
RBC: 5.11 MIL/uL (ref 3.87–5.11)
RDW: 15.4 % (ref 11.5–15.5)
WBC: 9.1 10*3/uL (ref 4.0–10.5)
nRBC: 0 % (ref 0.0–0.2)

## 2022-11-29 LAB — BASIC METABOLIC PANEL
Anion gap: 7 (ref 5–15)
BUN: 8 mg/dL (ref 6–20)
CO2: 23 mmol/L (ref 22–32)
Calcium: 9.5 mg/dL (ref 8.9–10.3)
Chloride: 106 mmol/L (ref 98–111)
Creatinine, Ser: 0.93 mg/dL (ref 0.44–1.00)
GFR, Estimated: >60 mL/min (ref >60)
Glucose, Bld: 90 mg/dL (ref 70–99)
Potassium: 3.4 mmol/L — ABNORMAL LOW (ref 3.5–5.1)
Sodium: 136 mmol/L (ref 135–145)

## 2022-11-29 LAB — I-STAT BETA HCG BLOOD, ED (MC, WL, AP ONLY): I-stat hCG, quantitative: <5 m[IU]/mL (ref <5)

## 2022-11-29 LAB — TROPONIN I (HIGH SENSITIVITY): Troponin I (High Sensitivity): 4 ng/L (ref <18)

## 2022-11-29 NOTE — ED Provider Notes (Signed)
[place] EMERGENCY DEPARTMENT AT [facility] Provider Note   CSN: [id number] Arrival date & time: 11/29/22  8756     History  Chief Complaint  Patient presents with   Chest Pain   Headache    Merikay Ferrante Kellam-Wallace is a 22 y.o. female.  The history is provided by the patient and medical records.  Chest Pain Associated symptoms: headache   Headache  22 y.o. F with hx of anxiety, depression, intellectual disability, OCD, presenting to the ED with chest pain for the past hour.  Also has been having a headache for the past day or so.  Rather vague in her description.  States she is currently homeless.  Presented to ED for similar a few days ago but left prior to work-up being performed.  Felt like it may have been her anxiety then.  Does admit to being started on new medications during last admission from [facility]-- discharged 11/23/22.  Home Medications Prior to Admission medications   Medication Sig Start Date End Date Taking? Authorizing Provider  albuterol (VENTOLIN HFA) 108 (90 Base) MCG/ACT inhaler Inhale 1 puff into the lungs 4 (four) times daily as needed for wheezing or shortness of breath.    [provider]  busPIRone (BUSPAR) 5 MG tablet Take 1 tablet (5 mg total) by mouth 3 (three) times daily. 11/23/22 12/23/22  Massengill, Harrold Donath, MD  escitalopram (LEXAPRO) 5 MG tablet Take 1 tablet (5 mg total) by mouth daily. 11/24/22 12/24/22  Massengill, Harrold Donath, MD  ferrous sulfate 325 (65 FE) MG tablet Take 1 tablet (325 mg total) by mouth at bedtime. Patient taking differently: Take 325 mg by mouth every other day. 04/14/22   Marylene Land, CNM  hydrOXYzine (ATARAX) 25 MG tablet Take 1 tablet (25 mg total) by mouth 3 (three) times daily as needed for anxiety. 11/23/22   Massengill, Harrold Donath, MD  medroxyPROGESTERone Acetate 150 MG/ML SUSY Inject 150 mg into the muscle every 3 (three) months. 08/08/22   [provider]  nicotine (NICODERM CQ - DOSED IN MG/24  HOURS) 14 mg/24hr patch Place 1 patch (14 mg total) onto the skin daily. 11/24/22   Massengill, Harrold Donath, MD  risperiDONE (RISPERDAL) 0.5 MG tablet Take 1 tablet (0.5 mg total) by mouth at bedtime. 11/23/22 12/23/22  Massengill, Harrold Donath, MD  valACYclovir (VALTREX) 1000 MG tablet Take 1 tablet (1,000 mg total) by mouth 2 (two) times daily. 05/22/22   Armando Reichert, CNM  rizatriptan (MAXALT-MLT) 10 MG disintegrating tablet Take 1 tablet at onset of migraine with 2 ibuprofen may repeat an additional tablet in 2 hours if needed 05/07/20 06/09/20  Deetta Perla, MD      Allergies    Benadryl [diphenhydramine hcl], Gluten meal, Shellfish allergy, Zithromax [azithromycin], Benadryl anti-itch childrens [camphor], Haldol [haloperidol], Haloperidol and related, Lactose intolerance (gi), and Latex    Review of Systems   Review of Systems  Cardiovascular:  Positive for chest pain.  Neurological:  Positive for headaches.  All other systems reviewed and are negative.   Physical Exam Updated Vital Signs BP 125/82 (BP Location: Right Arm)   Pulse 96   Temp 98.4 F (36.9 C) (Oral)   Resp 18   Ht 5\' 7"  (1.702 m)   Wt 108.9 kg   LMP 11/08/2022   SpO2 100%   BMI 37.59 kg/m   Physical Exam Vitals and nursing note reviewed.  Constitutional:      Appearance: She is well-developed.  HENT:     Head: Normocephalic  and atraumatic.  Eyes:     Conjunctiva/sclera: Conjunctivae normal.     Pupils: Pupils are equal, round, and reactive to light.  Cardiovascular:     Rate and Rhythm: Normal rate and regular rhythm.     Heart sounds: Normal heart sounds.  Pulmonary:     Effort: Pulmonary effort is normal.     Breath sounds: Normal breath sounds.  Abdominal:     General: Bowel sounds are normal.     Palpations: Abdomen is soft.  Musculoskeletal:        General: Normal range of motion.     Cervical back: Normal range of motion.  Skin:    General: Skin is warm and dry.  Neurological:     Mental  Status: She is alert and oriented to person, place, and time.  Psychiatric:     Comments: Odd affect     ED Results / Procedures / Treatments   Labs (all labs ordered are listed, but only abnormal results are displayed) Labs Reviewed  BASIC METABOLIC PANEL - Abnormal; Notable for the following components:      Result Value   Potassium 3.4 (*)    All other components within normal limits  CBC - Abnormal; Notable for the following components:   MCV 78.5 (*)    MCH 25.6 (*)    All other components within normal limits  I-STAT BETA HCG BLOOD, ED ([facility], [facility], [facility] ONLY)  TROPONIN I (HIGH SENSITIVITY)    EKG None  Radiology No results found.  Procedures Procedures    Medications Ordered in ED Medications - No data to display  ED Course/ Medical Decision Making/ A&P                             Medical Decision Making Amount and/or Complexity of Data Reviewed Labs: ordered. Radiology: ordered.   22 y.o. F here with chest pain and headache.  Seen recently for similar but left prior to work-up being completed.  EKG without acute ischemic changes.  Refused CXR as she felt she may have been pregnant-- testing was negative today.  She did have CXR just a few days ago which was negative.  No leukocytosis or anemia on labs today.  Chemistry without electrolyte derangement.  Trop negative.  Very low suspicion for ACS, PE, dissection, or other acute cardiac event.  Feel she is stable for discharge.  Close follow-up with PCP.  Can return here for new concerns.  Final Clinical Impression(s) / ED Diagnoses Final diagnoses:  Chest pain in adult    Rx / DC Orders ED Discharge Orders     None         Garlon Hatchet, PA-C 11/29/22 8453    Marily Memos, MD 11/29/22 [phone]

## 2022-11-29 NOTE — ED Triage Notes (Signed)
Patient BIB POV from street with complaint of chest pain x 1 hour, headache x 1 day. Patient reports being homeless, is drowsy in triage, denies taking anything .

## 2022-11-29 NOTE — Discharge Instructions (Signed)
You are not pregnant-- testing today was negative. Cardiac work-up was reassuring. Please follow-up with your primary care doctor. Return here for new concerns.

## 2022-12-16 ENCOUNTER — Emergency Department (HOSPITAL_COMMUNITY): Payer: 59

## 2022-12-16 ENCOUNTER — Emergency Department (HOSPITAL_COMMUNITY)
Admission: EM | Admit: 2022-12-16 | Discharge: 2022-12-16 | Discharge status: Against Medical Advice | Payer: 59 | Attending: Emergency Medicine | Admitting: Emergency Medicine

## 2022-12-16 ENCOUNTER — Encounter (HOSPITAL_COMMUNITY): Payer: Self-pay

## 2022-12-16 DIAGNOSIS — R079 Chest pain, unspecified: Secondary | ICD-10-CM | POA: Diagnosis not present

## 2022-12-16 DIAGNOSIS — R059 Cough, unspecified: Secondary | ICD-10-CM | POA: Diagnosis not present

## 2022-12-16 DIAGNOSIS — R0789 Other chest pain: Secondary | ICD-10-CM | POA: Diagnosis not present

## 2022-12-16 DIAGNOSIS — Z5329 Procedure and treatment not carried out because of patient's decision for other reasons: Secondary | ICD-10-CM | POA: Insufficient documentation

## 2022-12-16 DIAGNOSIS — Z1152 Encounter for screening for COVID-19: Secondary | ICD-10-CM | POA: Diagnosis not present

## 2022-12-16 DIAGNOSIS — J069 Acute upper respiratory infection, unspecified: Secondary | ICD-10-CM

## 2022-12-16 LAB — BASIC METABOLIC PANEL
Anion gap: 7 (ref 5–15)
BUN: 8 mg/dL (ref 6–20)
CO2: 24 mmol/L (ref 22–32)
Calcium: 9.1 mg/dL (ref 8.9–10.3)
Chloride: 108 mmol/L (ref 98–111)
Creatinine, Ser: 0.92 mg/dL (ref 0.44–1.00)
GFR, Estimated: >60 mL/min (ref >60)
Glucose, Bld: 99 mg/dL (ref 70–99)
Potassium: 3.6 mmol/L (ref 3.5–5.1)
Sodium: 139 mmol/L (ref 135–145)

## 2022-12-16 LAB — CBC
HCT: 38.8 % (ref 36.0–46.0)
Hemoglobin: 12.7 g/dL (ref 12.0–15.0)
MCH: 25.9 pg — ABNORMAL LOW (ref 26.0–34.0)
MCHC: 32.7 g/dL (ref 30.0–36.0)
MCV: 79.2 fL — ABNORMAL LOW (ref 80.0–100.0)
Platelets: 257 10*3/uL (ref 150–400)
RBC: 4.9 MIL/uL (ref 3.87–5.11)
RDW: 15.6 % — ABNORMAL HIGH (ref 11.5–15.5)
WBC: 6.4 10*3/uL (ref 4.0–10.5)
nRBC: 0 % (ref 0.0–0.2)

## 2022-12-16 LAB — URINALYSIS, ROUTINE W REFLEX MICROSCOPIC
Bilirubin Urine: NEGATIVE
Glucose, UA: NEGATIVE mg/dL
Hgb urine dipstick: NEGATIVE
Ketones, ur: NEGATIVE mg/dL
Nitrite: NEGATIVE
Protein, ur: NEGATIVE mg/dL
Specific Gravity, Urine: 1.016 (ref 1.005–1.030)
pH: 5 (ref 5.0–8.0)

## 2022-12-16 LAB — RESP PANEL BY RT-PCR (RSV, FLU A&B, COVID)  RVPGX2
Influenza A by PCR: NEGATIVE
Influenza B by PCR: NEGATIVE
Resp Syncytial Virus by PCR: NEGATIVE
SARS Coronavirus 2 by RT PCR: NEGATIVE

## 2022-12-16 LAB — I-STAT BETA HCG BLOOD, ED (MC, WL, AP ONLY): I-stat hCG, quantitative: <5 m[IU]/mL (ref <5)

## 2022-12-16 MED ORDER — ACETAMINOPHEN 500 MG PO TABS
1000.0000 mg | ORAL_TABLET | Freq: Once | ORAL | Status: AC
  Administered 2022-12-16: 1000 mg via ORAL
  Filled 2022-12-16: qty 2

## 2022-12-16 MED ORDER — LACTATED RINGERS IV BOLUS: 1000.0000 mL | Freq: Once | INTRAVENOUS | Status: DC

## 2022-12-16 MED ORDER — DEXAMETHASONE SODIUM PHOSPHATE 10 MG/ML IJ SOLN
10.0000 mg | Freq: Once | INTRAMUSCULAR | Status: DC
  Filled 2022-12-16: qty 1

## 2022-12-16 MED ORDER — DEXAMETHASONE SODIUM PHOSPHATE 10 MG/ML IJ SOLN
10.0000 mg | Freq: Once | INTRAMUSCULAR | Status: AC
  Administered 2022-12-16: 10 mg via INTRAMUSCULAR

## 2022-12-16 MED ORDER — ALBUTEROL SULFATE HFA 108 (90 BASE) MCG/ACT IN AERS
2.0000 | INHALATION_SPRAY | Freq: Once | RESPIRATORY_TRACT | Status: AC
  Administered 2022-12-16: 2 via RESPIRATORY_TRACT
  Filled 2022-12-16: qty 6.7

## 2022-12-16 MED ORDER — ALBUTEROL SULFATE HFA 108 (90 BASE) MCG/ACT IN AERS: 2.0000 | INHALATION_SPRAY | Freq: Once | RESPIRATORY_TRACT | Status: DC

## 2022-12-16 NOTE — ED Notes (Signed)
Belongings with pt upon depart. Ambulatory to POV by self. VSS.

## 2022-12-16 NOTE — Discharge Instructions (Signed)
Will send antibiotic to your pharmacy if urine is positive for infection.  I assume you will get a call from the pharmacy.

## 2022-12-16 NOTE — ED Triage Notes (Signed)
Pt came in via POV d/t last 2 week for coughing & wheezing, fevers & night sweats. A/Ox4, rates pain 10/10 while in triage.

## 2022-12-16 NOTE — ED Provider Notes (Signed)
[place] EMERGENCY DEPARTMENT AT [facility] Provider Note  Medical Decision Making   HPI: Melissa [name] is a 22 y.o. female with history perinent for depression/anxiety, partial epilepsy not on medication, sickle cell trait who presents complaining of multiple complaints. Patient arrived via POV from home.  History provided by patient.  No interpreter required for this encounter.  Patient reports that she has had numerous symptoms over the past 2 weeks.  Reports that she has had approximately 2 weeks of dysuria without urgency or frequency.  Reports that she has had bilateral rhinorrhea, cough productive of green sputum, bilateral ear pain, sore throat, intermittent headache for approximately 2 weeks.  Reports that she has not taken any medications as she did not want anything to interact with her psychiatric medications.  Denies any confusion, neck stiffness, shortness of breath, abdominal pain, diarrhea, vomiting.  She is tolerating p.o.  She does endorse some chest wall pain when she is coughing, otherwise none.  She has had subjective fevers and chills, reports that she presented today because she had increased sweating overnight and had to change her sheets this morning so she was concern for high fever, has not taken her temperature at home.  Has not had night sweats aside from waking up this morning with sweats.  ROS: As per HPI. Please see MAR for complete past medical history, surgical history, and social history.   Physical exam is pertinent for cough, congestion, rhinorrhea.   The differential includes but is not limited to AOM, mastoiditis, meningitis, sinusitis, strep pharyngitis, URI, pneumonia, UTI  Additional history obtained from: Chart review External records from outside source obtained and reviewed including: Patient with numerous prior ED visits  ED provider interpretation of ECG: Rate 105, sinus rhythm, nonspecific T wave inversions in lead III, V1,  V3, no ST elevations or depressions  ED provider interpretation of radiology/imaging: Chest x-ray without focal airspace opacification, cardiomediastinal silhouette derangement, pleural effusion, bony displacement, pneumothorax  Labs ordered were interpreted by myself as well as my attending and were incorporated into the medical decision making process for this patient.  ED provider interpretation of labs: BMP with out AKI or emergent electrolyte derangement, beta-hCG WNL.  CBC without leukocytosis, anemia, thrombocytopenia.  UA without UTI.  Viral panel negative.  Interventions: Tylenol, albuterol puffs, Decadron.  See the EMR for full details regarding lab and imaging results.  Patient overall well-appearing on exam, vitally stable, initially was mildly tachycardic in triage, however by the time of my evaluation and throughout the Manger of the patient stay in the ED patient was with normal rhythm.  Lungs CTAB.  Patient does feel that this may be related to her allergies/asthma, was given updated albuterol inhaler as well as a shot of Decadron.  Overall multisystem symptoms are most consistent with URI.  No evidence of AOM or mastoiditis on physical exam.  No photophobia, altered mental status or nuchal rigidity concerning for carditis.  No new unilateral nasal discharge or focal sinus numbness concerning for sinusitis.  No fever, tonsillomegaly, exudates, or anterior chain lymphadenopathy concerning for strep pharyngitis.  No evidence of pneumonia on checks x-ray.  Patient reports that she needs to leave the ED, states that she has to be back at the shelter by 5 PM in order to reserve a bed for the night.  Would like to leave AMA before the results of her UA or COVID flu.  Followed up the results of these test after she left, COVID, flu, RSV negative.  UA with moderate LE, rare bacteria, given negative nitrites, few WBC more consistent with contamination rather than infection, do not feel that patient  requires callback or telephone prescription for antibiotics at this time.     Consults: Not indicated  Disposition: AMA, plan was to discharge plus/minus antibiotics  The plan for this patient was discussed with Dr. Lockie Mola, who voiced agreement and who oversaw evaluation and treatment of this patient.  Clinical Impression:  1. Upper respiratory tract infection, unspecified type    AMA  Therapies: These medications and interventions were provided for the patient while in the ED. Medications  acetaminophen (TYLENOL) tablet 1,000 mg (1,000 mg Oral Given 12/16/22 1605)  albuterol (VENTOLIN HFA) 108 (90 Base) MCG/ACT inhaler 2 puff (2 puffs Inhalation Given 12/16/22 1606)  dexamethasone (DECADRON) injection 10 mg (10 mg Intramuscular Given 12/16/22 1612)    MDM generated using voice dictation software and may contain dictation errors.  Please contact me for any clarification or with any questions.  Clinical Complexity A medically appropriate history, review of systems, and physical exam was performed.  Collateral history obtained from: Chart review I personally reviewed the labs, EKG, imaging as discussed above. Patient's presentation is most consistent with acute complicated illness / injury requiring diagnostic workup Considered and ruled out life and body threatening conditions  Treatment: Outpatient follow-up Medications: Prescription Discussed patient's care with providers from the following different specialties: None  Physical Exam   ED Triage Vitals  Enc Vitals Group     BP 12/16/22 1435 121/78     Pulse Rate 12/16/22 1435 (!) 107     Resp 12/16/22 1435 19     Temp 12/16/22 1435 98.9 F (37.2 C)     Temp src --      SpO2 12/16/22 1435 98 %     Weight --      Height --      Head Circumference --      Peak Flow --      Pain Score 12/16/22 1443 10     Pain Loc --      Pain Edu? --      Excl. in GC? --      Physical Exam Vitals and nursing note reviewed.   Constitutional:      General: She is not in acute distress.    Appearance: She is well-developed.  HENT:     Head: Normocephalic and atraumatic.     Right Ear: Tympanic membrane normal.     Left Ear: Tympanic membrane normal.     Ears:     Comments: No mastoid erythema or tenderness    Nose: Congestion and rhinorrhea present.     Mouth/Throat:     Mouth: Mucous membranes are moist.     Pharynx: Oropharynx is clear. No oropharyngeal exudate or posterior oropharyngeal erythema.     Comments: No tonsillomegaly, tonsillar exudates, erythema Eyes:     Conjunctiva/sclera: Conjunctivae normal.  Cardiovascular:     Rate and Rhythm: Normal rate and regular rhythm.     Heart sounds: No murmur heard. Pulmonary:     Effort: Pulmonary effort is normal. No respiratory distress.     Breath sounds: Normal breath sounds.  Abdominal:     General: Abdomen is flat. Bowel sounds are normal.     Palpations: Abdomen is soft.     Tenderness: There is no abdominal tenderness. There is no guarding or rebound.  Musculoskeletal:        General: No swelling.  Cervical back: Neck supple. No rigidity.  Skin:    General: Skin is warm and dry.     Capillary Refill: Capillary refill takes less than 2 seconds.  Neurological:     Mental Status: She is alert and oriented to person, place, and time.  Psychiatric:        Mood and Affect: Mood normal.       Procedure Note  Procedures  DG Chest 2 View  Final Result      Julianne Rice, MD Emergency Medicine, PGY-2   Curley Spice, MD 12/16/22 1914    Virgina Norfolk, DO 12/16/22 1927

## 2022-12-16 NOTE — ED Provider Notes (Addendum)
Supervised resident visit.  Patient here with allergy symptoms and URI symptoms and painful urination.  History of reactive airway disease.  She is unremarkable vitals.  Differential diagnosis likely viral process versus allergy process.  Labs unremarkable per my review and interpretation.  Chest x-ray negative for pneumonia.  Plan is to get urinalysis to evaluate for urinary tract infection.  And treat for maybe some mild asthma/allergy process with steroids and albuterol.  Anticipate discharge to home.  Patient did not want to wait for her urinalysis to be completed.  She left prior to the results come in.  Will send an antibiotic if urine is positive.  This chart was dictated using voice recognition software.  Despite best efforts to proofread,  errors can occur which can change the documentation meaning.    Virgina Norfolk, DO 12/16/22 1604    Virgina Norfolk, DO 12/16/22 1637

## 2022-12-16 NOTE — ED Notes (Signed)
Patient transported to X-ray 

## 2023-01-07 ENCOUNTER — Encounter (HOSPITAL_COMMUNITY): Payer: Self-pay

## 2023-01-07 ENCOUNTER — Ambulatory Visit (HOSPITAL_COMMUNITY)
Admission: EM | Admit: 2023-01-07 | Discharge: 2023-01-07 | Disposition: A | Discharge status: Home / Self Care | Payer: 59 | Attending: Emergency Medicine | Admitting: Emergency Medicine

## 2023-01-07 DIAGNOSIS — R350 Frequency of micturition: Secondary | ICD-10-CM | POA: Diagnosis not present

## 2023-01-07 DIAGNOSIS — N898 Other specified noninflammatory disorders of vagina: Secondary | ICD-10-CM | POA: Diagnosis not present

## 2023-01-07 DIAGNOSIS — R300 Dysuria: Secondary | ICD-10-CM

## 2023-01-07 LAB — POCT URINALYSIS DIP (MANUAL ENTRY)
Bilirubin, UA: NEGATIVE
Blood, UA: NEGATIVE
Glucose, UA: NEGATIVE mg/dL
Ketones, POC UA: NEGATIVE mg/dL
Nitrite, UA: NEGATIVE
Protein Ur, POC: NEGATIVE mg/dL
Spec Grav, UA: <=1.005 — AB (ref 1.010–1.025)
Urobilinogen, UA: 0.2 E.U./dL
pH, UA: 5.5 (ref 5.0–8.0)

## 2023-01-07 LAB — POCT URINE PREGNANCY: Preg Test, Ur: NEGATIVE

## 2023-01-07 NOTE — Discharge Instructions (Signed)
Common causes of urinary tract infections include but are not limited to holding your urine longer than you should, squatting instead of sitting down when urinating, sitting around in wet clothing such as a wet swimsuit or gym clothes too long, not emptying your bladder after having sexual intercourse, wiping from back to front instead of front to back after having a bowel movement.     The urinalysis that we performed in the clinic today was essentially normal.  Because you are having symptoms of urinary tract infection, urine culture will be performed.  The result of the urine culture will be available in the next 3 to 5 days and will be posted to your MyChart account.  If there is an abnormal finding, you will be contacted by phone and advised of further treatment recommendations, if any.   The results of your vaginal swab test which screens for BV, yeast, gonorrhea, chlamydia and trichomonas will be made posted to your MyChart account once it is complete.  This typically takes 2 to 4 days.  Please abstain from sexual intercourse of any kind, vaginal, oral or anal, until you have received the results of your STD testing.     If any of your results are abnormal, you will receive a phone call regarding treatment.  Prescriptions, if any are needed, will be provided for you at your pharmacy.     Your urine pregnancy test today is negative.   If you have not had complete resolution of your symptoms after completing any recommended treatment or if your symptoms worsen, please return for repeat evaluation.   Thank you for visiting urgent care today.  I appreciate the opportunity to participate in your care.

## 2023-01-07 NOTE — ED Provider Notes (Addendum)
[facility]    CSN: [id number] Arrival date & time: 01/07/23  1521    HISTORY   Chief Complaint  Patient presents with   Urinary Frequency   HPI Melissa [name] is a pleasant, 22 y.o. female who presents to urgent care today. Patient here today with c/o burning with urination, increased frequency of urination, vaginal itching and irritation, N&V, diarrhea, and trouble breathing X 3 days.  Patient states she does not recall how many episodes of vomiting or diarrhea she has had in the past 3 days or the last time she had an episode of either.  Patient reports h/o asthma, vital signs are normal on arrival today. She states that urinary and vaginal symptoms started after having intercourse with someone.  Patient is requesting STD testing this time.  Patient states she took ibuprofen yesterday without any relief of any of the above symptoms.    The history is provided by the patient.   Past Medical History:  Diagnosis Date   Anemia    Anxiety    Asthma    inhaler. 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   Seizures ([name])    last one in 2015 - on meds   Sickle cell trait ([name])    TBI (traumatic brain injury) ([name]) 2006   Patient Active Problem List   Diagnosis Date Noted   OCD (obsessive compulsive disorder) 11/23/2022   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   Delivery by emergency cesarean 06/05/2022   Umbilical  cord prolapse 06/05/2022   Vaginal bleeding during pregnancy 05/28/2022   Gonorrhea affecting pregnancy in second trimester 05/18/2022   ?fetal pericardial effusion 05/18/2022   [redacted] weeks gestation of pregnancy    Preterm premature rupture of membranes (PPROM) with unknown onset of labor 05/17/2022   Oligohydramnios antepartum 05/11/2022   Abnormal antenatal AFP screen 04/12/2022   History of cesarean section 04/07/2022   Bartholin cyst 04/07/2022   History of herpes genitalis 04/07/2022   Supervision of high risk pregnancy, antepartum 02/08/2022   Trichomonal vaginitis during pregnancy in second trimester 02/01/2022   History of postpartum depression 11/16/2021   Abnormal involuntary movements 08/04/2020   Migraine with aura and without status migrainosus, not intractable 08/12/2019   Seizures ([name])    Sickle cell trait ([name]) 02/13/2019   Rh negative state in antepartum period 02/05/2019   Short interval between pregnancies affecting pregnancy in second trimester, antepartum 02/05/2019   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   DMDD (disruptive mood dysregulation disorder) ([name]) 07/21/2016   MDD (major depressive disorder), recurrent severe, 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   Chronic constipation 08/03/2015   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   Past Surgical History:  Procedure Laterality Date   APPENDECTOMY     CESAREAN SECTION N/A 08/31/2021   Procedure: CESAREAN SECTION;  Surgeon: Kathrynn Running, MD;  Location: [facility];  Service: Obstetrics;  Laterality: N/A;   CESAREAN SECTION N/A 06/05/2022   Procedure: CESAREAN SECTION;  Surgeon: Hermina Staggers, MD;  Location: [facility];  Service: Obstetrics;  Laterality: N/A;   HERNIA REPAIR     INTESTINAL MALROTATION REPAIR  2001   OB History     Gravida  4   Para  3   Term  2   Preterm   1   AB  1   Living  3      SAB  1   IAB      Ectopic      Multiple  0   Live Births  3          Home Medications    Prior to Admission medications   Medication Sig Start Date End Date Taking? Authorizing Provider  albuterol (VENTOLIN HFA) 108 (90 Base) MCG/ACT inhaler Inhale 1 puff into the lungs 4 (four) times daily as needed for wheezing or shortness of breath.   Yes [provider]  escitalopram (LEXAPRO) 5 MG tablet Take 1 tablet (5 mg total) by mouth daily. 11/24/22 01/07/23 Yes Massengill, Harrold Donath, MD  ferrous sulfate 325 (65 FE) MG tablet Take 1 tablet (325 mg total) by mouth at bedtime. Patient taking differently: Take 325 mg by mouth every other day. 04/14/22  Yes Marylene Land, CNM  hydrOXYzine (ATARAX) 25 MG tablet Take 1 tablet (25 mg total) by mouth 3 (three) times daily as needed for anxiety. 11/23/22  Yes Massengill, Harrold Donath, MD  risperiDONE (RISPERDAL) 0.5 MG tablet Take 1 tablet (0.5 mg total) by mouth at bedtime. 11/23/22 01/07/23 Yes Massengill, Harrold Donath, MD  medroxyPROGESTERone Acetate 150 MG/ML SUSY Inject 150 mg into the muscle every 3 (three) months. 08/08/22   [provider]  valACYclovir (VALTREX) 1000 MG tablet Take 1 tablet (1,000 mg total) by mouth 2 (two) times daily. 05/22/22   Thressa Sheller D, CNM  rizatriptan (MAXALT-MLT) 10 MG disintegrating tablet Take 1 tablet at onset of migraine with 2 ibuprofen may repeat an additional tablet in 2 hours if needed 05/07/20 06/09/20  Deetta Perla, MD    Family History Family History  Problem Relation Age of Onset   Depression Mother    Stroke Mother    Obesity Mother    Post-traumatic stress disorder Mother    Anxiety disorder Mother    Hypertension Mother    Diabetes Mother    Schizophrenia Father    Kidney disease Father    Social History Social History   Tobacco Use   Smoking status: Every Day    Packs/day: .25    Types: Cigarettes   Smokeless tobacco: Never    Tobacco comments:    black and milds, 2 cigs daily  Vaping Use   Vaping Use: Former  Substance Use Topics   Alcohol use: Not Currently    Comment: occasionally, prior to pregnancy   Drug use: Not Currently    Types: Marijuana    Comment: last 2020   Allergies   Benadryl [diphenhydramine hcl], Haldol [haloperidol], Latex, Shellfish allergy, Zithromax [azithromycin], Camphor, Gluten meal, and Lactose intolerance (gi)  Review of Systems Review of Systems Pertinent findings revealed after performing a 14 point review of systems has been noted in the history of present illness.  Physical Exam Vital Signs BP 110/80 (BP Location: Left Arm)   Pulse 87   Temp 98.1 F (36.7 C) (Oral)   Resp 16   Ht 5\' 7"  (  1.702 m)   Wt 239 lb (108.4 kg)   SpO2 100%   BMI 37.43 kg/m   No data found.  Physical Exam Vitals and nursing note reviewed.  Constitutional:      General: She is not in acute distress.    Appearance: Normal appearance. She is not ill-appearing.  HENT:     Head: Normocephalic and atraumatic.     Salivary Glands: Right salivary gland is not diffusely enlarged or tender. Left salivary gland is not diffusely enlarged or tender.     Right Ear: Tympanic membrane, ear canal and external ear normal. No drainage. No middle ear effusion. There is no impacted cerumen. Tympanic membrane is not erythematous or bulging.     Left Ear: Tympanic membrane, ear canal and external ear normal. No drainage.  No middle ear effusion. There is no impacted cerumen. Tympanic membrane is not erythematous or bulging.     Nose: Nose normal. No nasal deformity, septal deviation, mucosal edema, congestion or rhinorrhea.     Right Turbinates: Not enlarged, swollen or pale.     Left Turbinates: Not enlarged, swollen or pale.     Right Sinus: No maxillary sinus tenderness or frontal sinus tenderness.     Left Sinus: No maxillary sinus tenderness or frontal sinus tenderness.     Mouth/Throat:     Lips: Pink.  No lesions.     Mouth: Mucous membranes are moist. No oral lesions.     Pharynx: Oropharynx is clear. Uvula midline. No posterior oropharyngeal erythema or uvula swelling.     Tonsils: No tonsillar exudate. 0 on the right. 0 on the left.  Eyes:     General: Lids are normal.        Right eye: No discharge.        Left eye: No discharge.     Extraocular Movements: Extraocular movements intact.     Conjunctiva/sclera: Conjunctivae normal.     Right eye: Right conjunctiva is not injected.     Left eye: Left conjunctiva is not injected.  Neck:     Trachea: Trachea and phonation normal.  Cardiovascular:     Rate and Rhythm: Normal rate and regular rhythm.     Pulses: Normal pulses.     Heart sounds: Normal heart sounds. No murmur heard.    No friction rub. No gallop.  Pulmonary:     Effort: Pulmonary effort is normal. No accessory muscle usage, prolonged expiration or respiratory distress.     Breath sounds: Normal breath sounds. No stridor, decreased air movement or transmitted upper airway sounds. No decreased breath sounds, wheezing, rhonchi or rales.  Chest:     Chest wall: No tenderness.  Abdominal:     General: Abdomen is flat. Bowel sounds are normal. There is no distension.     Palpations: Abdomen is soft.     Tenderness: There is no abdominal tenderness. There is no right CVA tenderness or left CVA tenderness.     Hernia: No hernia is present.  Genitourinary:    Comments: Patient politely declines pelvic exam today, patient provided a vaginal swab for testing. Musculoskeletal:        General: Normal range of motion.     Cervical back: Normal range of motion and neck supple. Normal range of motion.  Lymphadenopathy:     Cervical: No cervical adenopathy.  Skin:    General: Skin is warm and dry.     Findings: No erythema or rash.  Neurological:     General:  No focal deficit present.     Mental Status: She is alert and oriented to person, place, and time.  Psychiatric:         Mood and Affect: Mood normal.        Behavior: Behavior normal.     Visual Acuity Right Eye Distance:   Left Eye Distance:   Bilateral Distance:    Right Eye Near:   Left Eye Near:    Bilateral Near:     UC Couse / Diagnostics / Procedures:     Radiology No results found.  Procedures Procedures (including critical care time) EKG  Pending results:  Labs Reviewed  POCT URINALYSIS DIP (MANUAL ENTRY) - Abnormal; Notable for the following components:      Result Value   Spec Grav, UA <=1.005 (*)    Leukocytes, UA Small (1+) (*)    All other components within normal limits  URINE CULTURE  POCT URINE PREGNANCY  CERVICOVAGINAL ANCILLARY ONLY    Medications Ordered in UC: Medications - No data to display  UC Diagnoses / Final Clinical Impressions(s)   I have reviewed the triage vital signs and the nursing notes.  Pertinent labs & imaging results that were available during my care of the patient were reviewed by me and considered in my medical decision making (see chart for details).    Final diagnoses:  Increased frequency of urination  Burning with urination  Problematic vaginal discharge   STD screening was performed, patient advised that the results be posted to their MyChart and if any of the results are positive, they will be notified by phone, further treatment will be provided as indicated based on results of STD screening. Patient was advised to abstain from sexual intercourse until that they receive the results of their STD testing.  Patient was also advised to use condoms to protect themselves from STD exposure. Urine pregnancy test was negative.  Urine dip today revealed pyuria, will perform urine culture due to patient complaining of symptoms of urinary tract infection at this time.   Patient advised that they will be contacted with results and that adjustments to treatment will be provided as indicated based on the results.   Patient was advised of  possibility that urine culture results may be negative if sample provided was obtained late in the day causing urine to be more diluted.  Patient was advised that if antibiotics were effective after the first 24 to 36 hours, despite negative urine culture result, it is recommended that they complete the full course as prescribed.    Physical exam unremarkable for asthma exacerbation.  Return precautions advised.  Please see discharge instructions below for details of plan of care as provided to patient. ED Prescriptions   None    PDMP not reviewed this encounter.  Disposition Upon Discharge:  Condition: stable for discharge home  Patient presented with concern for an acute illness with associated systemic symptoms and significant discomfort requiring urgent management. In my opinion, this is a condition that a prudent lay person (someone who possesses an average knowledge of health and medicine) may potentially expect to result in complications if not addressed urgently such as respiratory distress, impairment of bodily function or dysfunction of bodily organs.   As such, the patient has been evaluated and assessed, work-up was performed and treatment was provided in alignment with urgent care protocols and evidence based medicine.  Patient/parent/caregiver has been advised that the patient may require follow up for further testing and/or treatment if  the symptoms continue in spite of treatment, as clinically indicated and appropriate.  Routine symptom specific, illness specific and/or disease specific instructions were discussed with the patient and/or caregiver at length.  Prevention strategies for avoiding STD exposure were also discussed.  The patient will follow up with their current PCP if and as advised. If the patient does not currently have a PCP we will assist them in obtaining one.   The patient may need specialty follow up if the symptoms continue, in spite of conservative  treatment and management, for further workup, evaluation, consultation and treatment as clinically indicated and appropriate.  Patient/parent/caregiver verbalized understanding and agreement of plan as discussed.  All questions were addressed during visit.  Please see discharge instructions below for further details of plan.  Discharge Instructions:   Discharge Instructions      Common causes of urinary tract infections include but are not limited to holding your urine longer than you should, squatting instead of sitting down when urinating, sitting around in wet clothing such as a wet swimsuit or gym clothes too long, not emptying your bladder after having sexual intercourse, wiping from back to front instead of front to back after having a bowel movement.     The urinalysis that we performed in the clinic today was essentially normal.  Because you are having symptoms of urinary tract infection, urine culture will be performed.  The result of the urine culture will be available in the next 3 to 5 days and will be posted to your MyChart account.  If there is an abnormal finding, you will be contacted by phone and advised of further treatment recommendations, if any.   The results of your vaginal swab test which screens for BV, yeast, gonorrhea, chlamydia and trichomonas will be made posted to your MyChart account once it is complete.  This typically takes 2 to 4 days.  Please abstain from sexual intercourse of any kind, vaginal, oral or anal, until you have received the results of your STD testing.     If any of your results are abnormal, you will receive a phone call regarding treatment.  Prescriptions, if any are needed, will be provided for you at your pharmacy.     Your urine pregnancy test today is negative.   If you have not had complete resolution of your symptoms after completing any recommended treatment or if your symptoms worsen, please return for repeat evaluation.   Thank you for  visiting urgent care today.  I appreciate the opportunity to participate in your care.     This office note has been dictated using Teaching laboratory technician.  Unfortunately, this method of dictation can sometimes lead to typographical or grammatical errors.  I apologize for your inconvenience in advance if this occurs.  Please do not hesitate to reach out to me if clarification is needed.       Theadora Rama Scales, PA-C 01/07/23 1728    Theadora Rama Scales, PA-C 01/07/23 1730

## 2023-01-07 NOTE — ED Triage Notes (Signed)
Patient here today with c/o pain in urination, frequency in urination, N&V, diarrhea, and trouble breathing X 3 days. She does have a h/o asthma. She states that symptoms started after having intercourse with someone. She would like to be tested for STDs. She took IBU yesterday with no relief.

## 2023-01-08 LAB — URINE CULTURE

## 2023-01-09 LAB — CERVICOVAGINAL ANCILLARY ONLY
Bacterial Vaginitis (gardnerella): POSITIVE — AB
Candida Glabrata: NEGATIVE
Candida Vaginitis: NEGATIVE
Chlamydia: NEGATIVE
Comment: NEGATIVE
Comment: NEGATIVE
Comment: NEGATIVE
Comment: NEGATIVE
Comment: NEGATIVE
Comment: NORMAL
Neisseria Gonorrhea: NEGATIVE
Trichomonas: POSITIVE — AB

## 2023-01-10 ENCOUNTER — Telehealth (HOSPITAL_COMMUNITY): Payer: Self-pay | Admitting: Emergency Medicine

## 2023-01-10 MED ORDER — METRONIDAZOLE 500 MG PO TABS
500.0000 mg | ORAL_TABLET | Freq: Two times a day (BID) | ORAL | 0 refills | Status: DC
Start: 2023-01-10 — End: 2023-02-17

## 2023-01-15 ENCOUNTER — Ambulatory Visit (HOSPITAL_COMMUNITY)
Admission: EM | Admit: 2023-01-15 | Discharge: 2023-01-15 | Disposition: A | Discharge status: Home / Self Care | Payer: 59

## 2023-01-15 ENCOUNTER — Encounter (HOSPITAL_COMMUNITY): Payer: Self-pay

## 2023-01-15 NOTE — ED Provider Notes (Signed)
Left without being seen after triage. Not seen or evaluated by this provider    Luisangel Wainright, Lurena Joiner, PA-C 01/15/23 1200

## 2023-01-15 NOTE — ED Triage Notes (Signed)
Patient with chest congestion and coughing x 3 days. Pt stated she is itching all over; has not refill her medication.

## 2023-01-17 DIAGNOSIS — I10 Essential (primary) hypertension: Secondary | ICD-10-CM | POA: Diagnosis not present

## 2023-01-17 DIAGNOSIS — R457 State of emotional shock and stress, unspecified: Secondary | ICD-10-CM | POA: Diagnosis not present

## 2023-01-18 ENCOUNTER — Ambulatory Visit (HOSPITAL_COMMUNITY)
Admission: EM | Admit: 2023-01-18 | Discharge: 2023-01-18 | Disposition: A | Discharge status: Home / Self Care | Payer: 59 | Attending: Psychiatry | Admitting: Psychiatry

## 2023-01-18 DIAGNOSIS — Z760 Encounter for issue of repeat prescription: Secondary | ICD-10-CM

## 2023-01-18 DIAGNOSIS — F411 Generalized anxiety disorder: Secondary | ICD-10-CM

## 2023-01-18 MED ORDER — HYDROXYZINE HCL 25 MG PO TABS
50.0000 mg | ORAL_TABLET | ORAL | Status: AC
  Administered 2023-01-18: 50 mg via ORAL
  Filled 2023-01-18: qty 2

## 2023-01-18 NOTE — Progress Notes (Signed)
   01/18/23 1936  [facility] Triage Screening (Walk-ins at [facility] only)  What Is the Reason for Your Visit/Call Today? Pt presents to [facility] voluntarily, unaccompanied with complaint of anxiety and panic attacks. Pt reports that about an hour ago she was experiencing chest pains,feeling that her throat was closing up, and shortness of breath. Pt is prescribed Hydroxyzine, Buspar and Respiradol and took her last dose about 2 days ago.  Pt denies SI,HI, AVH and substance/alcohol use.  How Long Has This Been Causing You Problems? <Week  Have You Recently Had Any Thoughts About Hurting Yourself? No  Are You Planning to Commit Suicide/Harm Yourself At This time? No  Have you Recently Had Thoughts About Hurting Someone Karolee Ohs? No  Are You Planning To Harm Someone At This Time? No  Are you currently experiencing any auditory, visual or other hallucinations? No  Have You Used Any Alcohol or Drugs in the Past 24 Hours? No  Do you have any current medical co-morbidities that require immediate attention? No  Clinician description of patient physical appearance/behavior: Calm and cooperative  What Do You Feel Would Help You the Most Today? Treatment for Depression or other mood problem;Medication(s)  If access to [facility] was not available, would you have sought care in the Emergency Department? No  Determination of Need Routine (7 days)  Options For Referral Other: Comment;Outpatient Therapy;Medication Management

## 2023-01-18 NOTE — Discharge Instructions (Addendum)
...  the Pt to f/u with walkin pyschiatry

## 2023-01-18 NOTE — ED Provider Notes (Signed)
Behavioral Health Urgent Care Medical Screening Exam  Patient Name: Melissa [name] MRN: [id number] Date of Evaluation: 01/19/23 Chief Complaint:   Diagnosis:  Final diagnoses:  Medication refill  Generalized anxiety disorder    History of Present illness: Melissa [name] is a 22 y.o. female.  With a history of anxiety, GAD, presented to [facility] voluntarily, per the patient she have had a panic attack today and she is trying to get medications refilled.  According to the patient she is not seeing a psychiatrist or therapist when asked why did not she follow up with her psychiatrist patient stated I am busy.  According to patient she just relocated back to [place] [facility] from [facility].  Patient reported that she currently is homeless, however she works as a Conservation officer, nature.  According to patient she has 3 kids ages 8, ages 47, and 78-month-old who are currently staying with their grandmother.  Writer discussed with patient the need to get established with a psychiatric provider and also spoke with social work to provide patient with outpatient resources to get patient established.   Face-to-face observation of patient, patient is alert and oriented x 4, speech is clear, answers questions appropriately.  Patient denies SI, HI, AVH or paranoia at this time.  According to patient she is out of her medicine she needs refills discussed with patient to follow-up with walk-in psychiatry in the morning and also to get established with a provider to manage her psychiatric medications.  Patient denies alcohol use, denies illicit drug use denies smoking.  Patient does not seem to be influenced by external or internal stimuli at this time.  Patient was educated and verbalized understanding of mental health resources and other crisis services in the community she is instructed to call 911 and present to the nearest emergency room should she experience any suicide ideation or hallucinations or any  worsening of her mental condition.  Upon completion of this assessment patient was both mentally and medically stable to be discharged.   Flowsheet Row ED from 01/18/2023 in [facility] ED from 01/15/2023 in [facility] at [facility] ED from 01/07/2023 in [facility] at [facility] RISK CATEGORY No Risk No Risk No Risk       Psychiatric Specialty Exam  Presentation  General Appearance:Casual  Eye Contact:Good  Speech:Clear and Coherent  Speech Volume:Normal  Handedness:Right   Mood and Affect  Mood: Anxious  Affect: Appropriate   Thought Process  Thought Processes: Coherent  Descriptions of Associations:Circumstantial  Orientation:Full (Time, Place and Person)  Thought Content:WDL  Diagnosis of Schizophrenia or Schizoaffective disorder in past: No data recorded  Hallucinations:None Denies  Ideas of Reference:None  Suicidal Thoughts:No -- (denies)  Homicidal Thoughts:No   Sensorium  Memory: Immediate Fair  Judgment: Fair  Insight: Fair   Art therapist  Concentration: Good  Attention Span: Good  Recall: Good  Fund of Knowledge: Good  Language: Good   Psychomotor Activity  Psychomotor Activity: Normal   Assets  Assets: Desire for Improvement   Sleep  Sleep: Fair  Number of hours:  7   Physical Exam: Physical Exam HENT:     Head: Normocephalic.     Nose: Nose normal.  Cardiovascular:     Rate and Rhythm: Normal rate.  Pulmonary:     Effort: Pulmonary effort is normal.  Musculoskeletal:        General: Normal range of motion.     Cervical back: Normal range of motion.  Neurological:     General: No focal deficit present.     Mental Status: She is alert.  Psychiatric:        Mood and Affect: Mood normal.    Review of Systems  Constitutional: Negative.   HENT: Negative.    Eyes: Negative.   Respiratory: Negative.    Cardiovascular: Negative.    Gastrointestinal: Negative.   Genitourinary: Negative.   Musculoskeletal: Negative.   Skin: Negative.   Neurological: Negative.   Psychiatric/Behavioral:  The patient is nervous/anxious.    Blood pressure 114/80, pulse 96, temperature 97.6 F (36.4 C), temperature source Oral, resp. rate 20, last menstrual period 11/15/2022, SpO2 100 %, unknown if currently breastfeeding. There is no height or weight on file to calculate BMI.  Musculoskeletal: Strength & Muscle Tone: within normal limits Gait & Station: normal Patient leans: N/A   [facility] MSE Discharge Disposition for Follow up and Recommendations: Based on my evaluation the patient does not appear to have an emergency medical condition and can be discharged with resources and follow up care in outpatient services for Medication Management   Sindy Guadeloupe, NP 01/19/2023, 5:59 AM

## 2023-01-24 ENCOUNTER — Encounter (HOSPITAL_COMMUNITY): Payer: Self-pay | Admitting: Physician Assistant

## 2023-01-24 ENCOUNTER — Ambulatory Visit (INDEPENDENT_AMBULATORY_CARE_PROVIDER_SITE_OTHER): Payer: 59 | Admitting: Physician Assistant

## 2023-01-24 VITALS — BP 130/88 | HR 85 | Ht 67.0 in | Wt 237.0 lb

## 2023-01-24 DIAGNOSIS — F411 Generalized anxiety disorder: Secondary | ICD-10-CM | POA: Diagnosis not present

## 2023-01-24 DIAGNOSIS — F429 Obsessive-compulsive disorder, unspecified: Secondary | ICD-10-CM | POA: Diagnosis not present

## 2023-01-24 DIAGNOSIS — F333 Major depressive disorder, recurrent, severe with psychotic symptoms: Secondary | ICD-10-CM | POA: Diagnosis not present

## 2023-01-24 DIAGNOSIS — F53 Postpartum depression: Secondary | ICD-10-CM

## 2023-01-24 MED ORDER — ESCITALOPRAM OXALATE 5 MG PO TABS
5.0000 mg | ORAL_TABLET | Freq: Every day | ORAL | 1 refills | Status: DC
Start: 2023-01-24 — End: 2023-03-14

## 2023-01-24 MED ORDER — BUSPIRONE HCL 7.5 MG PO TABS
7.5000 mg | ORAL_TABLET | Freq: Three times a day (TID) | ORAL | 1 refills | Status: DC
Start: 2023-01-24 — End: 2023-03-14

## 2023-01-24 MED ORDER — HYDROXYZINE HCL 50 MG PO TABS
50.0000 mg | ORAL_TABLET | Freq: Three times a day (TID) | ORAL | 1 refills | Status: DC | PRN
Start: 2023-01-24 — End: 2023-03-14

## 2023-01-24 MED ORDER — RISPERIDONE 0.5 MG PO TABS
0.5000 mg | ORAL_TABLET | Freq: Every day | ORAL | 1 refills | Status: DC
Start: 2023-01-24 — End: 2023-03-14
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
## 2023-01-31 DIAGNOSIS — Z3200 Encounter for pregnancy test, result unknown: Secondary | ICD-10-CM | POA: Diagnosis not present

## 2023-01-31 DIAGNOSIS — Z30013 Encounter for initial prescription of injectable contraceptive: Secondary | ICD-10-CM | POA: Diagnosis not present

## 2023-01-31 DIAGNOSIS — Z3009 Encounter for other general counseling and advice on contraception: Secondary | ICD-10-CM | POA: Diagnosis not present

## 2023-01-31 DIAGNOSIS — Z01419 Encounter for gynecological examination (general) (routine) without abnormal findings: Secondary | ICD-10-CM | POA: Diagnosis not present

## 2023-01-31 DIAGNOSIS — Z113 Encounter for screening for infections with a predominantly sexual mode of transmission: Secondary | ICD-10-CM | POA: Diagnosis not present

## 2023-01-31 DIAGNOSIS — Z114 Encounter for screening for human immunodeficiency virus [HIV]: Secondary | ICD-10-CM | POA: Diagnosis not present

## 2023-02-17 ENCOUNTER — Encounter (HOSPITAL_COMMUNITY): Payer: Self-pay | Admitting: *Deleted

## 2023-02-17 ENCOUNTER — Other Ambulatory Visit: Payer: Self-pay

## 2023-02-17 ENCOUNTER — Emergency Department (HOSPITAL_COMMUNITY)
Admission: EM | Admit: 2023-02-17 | Discharge: 2023-02-17 | Disposition: A | Discharge status: Home / Self Care | Payer: 59 | Attending: Emergency Medicine | Admitting: Emergency Medicine

## 2023-02-17 DIAGNOSIS — Z91040 Latex allergy status: Secondary | ICD-10-CM | POA: Diagnosis not present

## 2023-02-17 DIAGNOSIS — Z5321 Procedure and treatment not carried out due to patient leaving prior to being seen by health care provider: Secondary | ICD-10-CM | POA: Diagnosis not present

## 2023-02-17 DIAGNOSIS — N739 Female pelvic inflammatory disease, unspecified: Secondary | ICD-10-CM | POA: Insufficient documentation

## 2023-02-17 DIAGNOSIS — R109 Unspecified abdominal pain: Secondary | ICD-10-CM | POA: Diagnosis not present

## 2023-02-17 LAB — URINALYSIS, ROUTINE W REFLEX MICROSCOPIC
Bilirubin Urine: NEGATIVE
Glucose, UA: NEGATIVE mg/dL
Hgb urine dipstick: NEGATIVE
Ketones, ur: NEGATIVE mg/dL
Nitrite: NEGATIVE
Protein, ur: NEGATIVE mg/dL
Specific Gravity, Urine: 1.004 — ABNORMAL LOW (ref 1.005–1.030)
pH: 6 (ref 5.0–8.0)

## 2023-02-17 LAB — COMPREHENSIVE METABOLIC PANEL
ALT: 17 U/L (ref 0–44)
AST: 19 U/L (ref 15–41)
Albumin: 3.7 g/dL (ref 3.5–5.0)
Alkaline Phosphatase: 101 U/L (ref 38–126)
Anion gap: 10 (ref 5–15)
BUN: <5 mg/dL — ABNORMAL LOW (ref 6–20)
CO2: 23 mmol/L (ref 22–32)
Calcium: 9.4 mg/dL (ref 8.9–10.3)
Chloride: 104 mmol/L (ref 98–111)
Creatinine, Ser: 0.86 mg/dL (ref 0.44–1.00)
GFR, Estimated: >60 mL/min (ref >60)
Glucose, Bld: 90 mg/dL (ref 70–99)
Potassium: 3.4 mmol/L — ABNORMAL LOW (ref 3.5–5.1)
Sodium: 137 mmol/L (ref 135–145)
Total Bilirubin: 0.6 mg/dL (ref 0.3–1.2)
Total Protein: 7.1 g/dL (ref 6.5–8.1)

## 2023-02-17 LAB — CBC
HCT: 41.1 % (ref 36.0–46.0)
Hemoglobin: 13.6 g/dL (ref 12.0–15.0)
MCH: 26.4 pg (ref 26.0–34.0)
MCHC: 33.1 g/dL (ref 30.0–36.0)
MCV: 79.7 fL — ABNORMAL LOW (ref 80.0–100.0)
Platelets: 268 10*3/uL (ref 150–400)
RBC: 5.16 MIL/uL — ABNORMAL HIGH (ref 3.87–5.11)
RDW: 15.8 % — ABNORMAL HIGH (ref 11.5–15.5)
WBC: 6.6 10*3/uL (ref 4.0–10.5)
nRBC: 0 % (ref 0.0–0.2)

## 2023-02-17 LAB — WET PREP, GENITAL
Clue Cells Wet Prep HPF POC: NONE SEEN
Sperm: NONE SEEN
WBC, Wet Prep HPF POC: >=10 — AB (ref <10)
Yeast Wet Prep HPF POC: NONE SEEN

## 2023-02-17 LAB — HCG, SERUM, QUALITATIVE: Preg, Serum: NEGATIVE

## 2023-02-17 LAB — LIPASE, BLOOD: Lipase: 30 U/L (ref 11–51)

## 2023-02-17 MED ORDER — KETOROLAC TROMETHAMINE 15 MG/ML IJ SOLN
15.0000 mg | Freq: Once | INTRAMUSCULAR | Status: DC
  Filled 2023-02-17: qty 1

## 2023-02-17 MED ORDER — CEFTRIAXONE SODIUM 500 MG IJ SOLR
500.0000 mg | Freq: Once | INTRAMUSCULAR | Status: DC
  Filled 2023-02-17: qty 500

## 2023-02-17 MED ORDER — DOXYCYCLINE HYCLATE 100 MG PO CAPS: 100.0000 mg | ORAL_CAPSULE | Freq: Two times a day (BID) | ORAL | 0 refills | Status: AC

## 2023-02-17 MED ORDER — METRONIDAZOLE 500 MG PO TABS: 500.0000 mg | ORAL_TABLET | Freq: Two times a day (BID) | ORAL | 0 refills | Status: AC

## 2023-02-17 NOTE — ED Notes (Signed)
Provided patient with behavioral number and reference per MD provider Dr. Madilyn Hook.

## 2023-02-17 NOTE — ED Triage Notes (Signed)
Patient c/o sorethroat onset 3 days ago , c/o abd cramping and vag. Discharge with painful urination onset 2 weeks ago states worse tonight

## 2023-02-17 NOTE — ED Provider Notes (Signed)
[facility] AT [facility] Provider Note   CSN: [id number] Arrival date & time: 02/17/23  4098     History {Add pertinent medical, surgical, social history, OB history to HPI:1} Chief Complaint  Patient presents with   Abdominal Pain   Sore Throat    Melissa [name] is a 22 y.o. female.   Abdominal Pain Sore Throat Associated symptoms include abdominal pain.       Home Medications Prior to Admission medications   Medication Sig Start Date End Date Taking? Authorizing Provider  doxycycline (VIBRAMYCIN) 100 MG capsule Take 1 capsule (100 mg total) by mouth 2 (two) times daily for 14 days. 02/17/23 03/03/23 Yes Antony Madura, PA-C  metroNIDAZOLE (FLAGYL) 500 MG tablet Take 1 tablet (500 mg total) by mouth 2 (two) times daily for 14 days. 02/17/23 03/03/23 Yes Antony Madura, PA-C  albuterol (VENTOLIN HFA) 108 (90 Base) MCG/ACT inhaler Inhale 1 puff into the lungs 4 (four) times daily as needed for wheezing or shortness of breath.    [provider]  busPIRone (BUSPAR) 7.5 MG tablet Take 1 tablet (7.5 mg total) by mouth 3 (three) times daily. 01/24/23 01/24/24  Nwoko, Stephens Shire E, PA  escitalopram (LEXAPRO) 5 MG tablet Take 1 tablet (5 mg total) by mouth daily. 01/24/23 01/24/24  Karel Jarvis E, PA  ferrous sulfate 325 (65 FE) MG tablet Take 1 tablet (325 mg total) by mouth at bedtime. Patient taking differently: Take 325 mg by mouth every other day. 04/14/22   Marylene Land, CNM  hydrOXYzine (ATARAX) 50 MG tablet Take 1 tablet (50 mg total) by mouth 3 (three) times daily as needed for anxiety. 01/24/23   Nwoko, Tommas Olp, PA  medroxyPROGESTERone Acetate 150 MG/ML SUSY Inject 150 mg into the muscle every 3 (three) months. 08/08/22   [provider]  risperiDONE (RISPERDAL) 0.5 MG tablet Take 1 tablet (0.5 mg total) by mouth at bedtime. 01/24/23 01/24/24  Nwoko, Tommas Olp, PA  valACYclovir (VALTREX) 1000 MG tablet Take 1 tablet (1,000  mg total) by mouth 2 (two) times daily. 05/22/22   Thressa Sheller D, CNM  rizatriptan (MAXALT-MLT) 10 MG disintegrating tablet Take 1 tablet at onset of migraine with 2 ibuprofen may repeat an additional tablet in 2 hours if needed 05/07/20 06/09/20  Deetta Perla, MD      Allergies    Benadryl [diphenhydramine hcl], Haldol [haloperidol], Latex, Shellfish allergy, Zithromax [azithromycin], Camphor, Gluten meal, and Lactose intolerance (gi)    Review of Systems   Review of Systems  Gastrointestinal:  Positive for abdominal pain.    Physical Exam Updated Vital Signs BP 124/81 (BP Location: Right Arm)   Pulse (!) 101   Temp 98.3 F (36.8 C) (Oral)   Resp 20   Ht 5\' 7"  (1.702 m)   Wt 108.4 kg   SpO2 100%   BMI 37.43 kg/m  Physical Exam  ED Results / Procedures / Treatments   Labs (all labs ordered are listed, but only abnormal results are displayed) Labs Reviewed  WET PREP, GENITAL - Abnormal; Notable for the following components:      Result Value   Trich, Wet Prep PRESENT (*)    WBC, Wet Prep HPF POC >=10 (*)    All other components within normal limits  COMPREHENSIVE METABOLIC PANEL - Abnormal; Notable for the following components:   Potassium 3.4 (*)    BUN <5 (*)    All other components within normal limits  CBC - Abnormal;  Notable for the following components:   RBC 5.16 (*)    MCV 79.7 (*)    RDW 15.8 (*)    All other components within normal limits  URINALYSIS, ROUTINE W REFLEX MICROSCOPIC - Abnormal; Notable for the following components:   APPearance HAZY (*)    Specific Gravity, Urine 1.004 (*)    Leukocytes,Ua LARGE (*)    Bacteria, UA RARE (*)    All other components within normal limits  URINE CULTURE  LIPASE, BLOOD  HCG, SERUM, QUALITATIVE  GC/CHLAMYDIA PROBE AMP ([place]) NOT AT [facility]    EKG None  Radiology No results found.  Procedures Procedures  {Document cardiac monitor, telemetry assessment procedure when  appropriate:1}  Medications Ordered in ED Medications  cefTRIAXone (ROCEPHIN) injection 500 mg (500 mg Intramuscular Patient Refused/Not Given 02/17/23 0627)  ketorolac (TORADOL) 15 MG/ML injection 15 mg (15 mg Intramuscular Patient Refused/Not Given 02/17/23 6045)    ED Course/ Medical Decision Making/ A&P Clinical Course as of 02/17/23 0645  Sat Feb 17, 2023  0640 Patient declining treatment with Rocephin.  Also reports that she is unwilling to take the doxycycline prescribed to her.  Instead, she wishes to wait until the results of her gonorrhea and chlamydia test result.  She does express willingness to complete a course of Flagyl, but only this medication, given her diagnosis of trichomonas in the ED.  I have explained to the patient that her treatment course is for the management of pelvic inflammatory disease which can be caused by anyone or combination of sexually transmitted infections.  The management of pelvic inflammatory disease does not change, even should her gonorrhea and Chlamydia tests return negative.  I have discussed the risks of incomplete treatment including, but not limited to tubo-ovarian abscess, loss of an ovary, bacteremia and sepsis, potential death.  She acknowledges these risks and continues to decline antibiotics in the ED.  She will be signed out of the department AGAINST MEDICAL ADVICE. [KH]    Clinical Course User Index [KH] Antony Madura, PA-C   {   Click here for ABCD2, HEART and other calculatorsREFRESH Note before signing :1}                          Medical Decision Making Amount and/or Complexity of Data Reviewed Labs: ordered.  Risk Prescription drug management.   ***  {Document critical care time when appropriate:1} {Document review of labs and clinical decision tools ie heart score, Chads2Vasc2 etc:1}  {Document your independent review of radiology images, and any outside records:1} {Document your discussion with family members, caretakers, and  with consultants:1} {Document social determinants of health affecting pt's care:1} {Document your decision making why or why not admission, treatments were needed:1} Final Clinical Impression(s) / ED Diagnoses Final diagnoses:  Pelvic inflammatory disease    Rx / DC Orders ED Discharge Orders          Ordered    doxycycline (VIBRAMYCIN) 100 MG capsule  2 times daily        02/17/23 0607    metroNIDAZOLE (FLAGYL) 500 MG tablet  2 times daily        02/17/23 0607

## 2023-02-17 NOTE — ED Notes (Signed)
Patient was getting ready to leave AMA and now states "I have thoughts that I don't want to be here and I just want to talk to somebody." Denies SI but having depression and stated above thought. States "I am going through a lot right now." Will notify ED provider.

## 2023-02-17 NOTE — Discharge Instructions (Signed)
Your work up today is positive for sexually transmitted infections. We believe you have pelvic inflammatory disease as a result of a UTI. Take doxycycline and flagyl as prescribed until finished. Follow-up with the health department in 48 hours for the results of your remaining STD tests. Notify all sexual partners of their need to be tested and treated as well. Do not engage in sexual intercourse until 1 week after the completion of your antibiotic treatment. Use a condom when sexually active. Take Aleve or ibuprofen for pain, as needed.

## 2023-02-18 ENCOUNTER — Emergency Department (HOSPITAL_COMMUNITY): Payer: 59

## 2023-02-18 ENCOUNTER — Encounter (HOSPITAL_COMMUNITY): Payer: Self-pay

## 2023-02-18 ENCOUNTER — Emergency Department (HOSPITAL_COMMUNITY)
Admission: EM | Admit: 2023-02-18 | Discharge: 2023-02-18 | Disposition: A | Discharge status: Home / Self Care | Payer: 59 | Attending: Emergency Medicine | Admitting: Emergency Medicine

## 2023-02-18 ENCOUNTER — Other Ambulatory Visit: Payer: Self-pay

## 2023-02-18 ENCOUNTER — Ambulatory Visit (HOSPITAL_COMMUNITY)
Admission: EM | Admit: 2023-02-18 | Discharge: 2023-02-18 | Disposition: A | Discharge status: Home / Self Care | Payer: 59 | Attending: Family Medicine | Admitting: Family Medicine

## 2023-02-18 DIAGNOSIS — J45909 Unspecified asthma, uncomplicated: Secondary | ICD-10-CM | POA: Diagnosis not present

## 2023-02-18 DIAGNOSIS — J069 Acute upper respiratory infection, unspecified: Secondary | ICD-10-CM

## 2023-02-18 DIAGNOSIS — R000 Tachycardia, unspecified: Secondary | ICD-10-CM | POA: Diagnosis not present

## 2023-02-18 DIAGNOSIS — R197 Diarrhea, unspecified: Secondary | ICD-10-CM | POA: Diagnosis not present

## 2023-02-18 DIAGNOSIS — Z1152 Encounter for screening for COVID-19: Secondary | ICD-10-CM | POA: Insufficient documentation

## 2023-02-18 DIAGNOSIS — R059 Cough, unspecified: Secondary | ICD-10-CM | POA: Diagnosis not present

## 2023-02-18 DIAGNOSIS — N730 Acute parametritis and pelvic cellulitis: Secondary | ICD-10-CM

## 2023-02-18 DIAGNOSIS — Z91040 Latex allergy status: Secondary | ICD-10-CM | POA: Insufficient documentation

## 2023-02-18 DIAGNOSIS — B9789 Other viral agents as the cause of diseases classified elsewhere: Secondary | ICD-10-CM | POA: Diagnosis not present

## 2023-02-18 DIAGNOSIS — R0602 Shortness of breath: Secondary | ICD-10-CM | POA: Diagnosis not present

## 2023-02-18 DIAGNOSIS — R509 Fever, unspecified: Secondary | ICD-10-CM | POA: Diagnosis not present

## 2023-02-18 LAB — RESP PANEL BY RT-PCR (RSV, FLU A&B, COVID)  RVPGX2
Influenza A by PCR: NEGATIVE
Influenza B by PCR: NEGATIVE
Resp Syncytial Virus by PCR: NEGATIVE
SARS Coronavirus 2 by RT PCR: NEGATIVE

## 2023-02-18 LAB — URINE CULTURE: Culture: 30000 — AB

## 2023-02-18 LAB — GROUP A STREP BY PCR: Group A Strep by PCR: NOT DETECTED

## 2023-02-18 MED ORDER — CETIRIZINE HCL 10 MG PO TABS: 10.0000 mg | ORAL_TABLET | Freq: Every day | ORAL | 0 refills | Status: DC

## 2023-02-18 MED ORDER — BENZONATATE 100 MG PO CAPS: 100.0000 mg | ORAL_CAPSULE | Freq: Three times a day (TID) | ORAL | 0 refills | Status: DC

## 2023-02-18 MED ORDER — LIDOCAINE HCL (PF) 1 % IJ SOLN
INTRAMUSCULAR | Status: AC
  Filled 2023-02-18: qty 2

## 2023-02-18 MED ORDER — ACETAMINOPHEN 500 MG PO TABS
1000.0000 mg | ORAL_TABLET | Freq: Once | ORAL | Status: AC
  Administered 2023-02-18: 1000 mg via ORAL
  Filled 2023-02-18: qty 2

## 2023-02-18 MED ORDER — CEFTRIAXONE SODIUM 500 MG IJ SOLR
500.0000 mg | INTRAMUSCULAR | Status: DC
  Administered 2023-02-18: 500 mg via INTRAMUSCULAR

## 2023-02-18 MED ORDER — BENZONATATE 100 MG PO CAPS
100.0000 mg | ORAL_CAPSULE | Freq: Once | ORAL | Status: AC
  Administered 2023-02-18: 100 mg via ORAL
  Filled 2023-02-18: qty 1

## 2023-02-18 MED ORDER — CEFTRIAXONE SODIUM 500 MG IJ SOLR
INTRAMUSCULAR | Status: AC
  Filled 2023-02-18: qty 500

## 2023-02-18 NOTE — ED Triage Notes (Signed)
Pt from home c/o flu-like symptoms x 2 days with sore throat x 2 weeks.

## 2023-02-18 NOTE — ED Notes (Signed)
Pt verbalized understanding of discharge instructions. Opportunity for questions provided. Work note provided.  

## 2023-02-18 NOTE — ED Notes (Signed)
Pt provided ice water 

## 2023-02-18 NOTE — ED Triage Notes (Signed)
Patient reports that she was at [facility] ED today and was told she had PID. Patient  states she refused the shot offered because she didn't know what it was for. Patient then states that her boyfriend called her a few minutes ago and stated that he was positive for gonorrhea.  Patient c/o lower abdominal pain, green vaginal discharge, and nausea.

## 2023-02-18 NOTE — ED Provider Notes (Signed)
[facility]    CSN: [id number] Arrival date & time: 02/18/23  1209      History   Chief Complaint Chief Complaint  Patient presents with   Abdominal Pain    HPI Melissa [name] is a 22 y.o. female.    Abdominal Pain  Here for sore throat and abdominal pain.  She was seen yesterday in the emergency room and she tested positive for trichomonas.  GC and Chlamydia testing were done but the results were not in.  Since staff felt that she had PID they had a sent in doxycycline and offered her ceftriaxone treatment.  She declined both those, wanting to wait on the results of her test.  She then returned to the emergency room this morning and was seen for sore throat.  Respiratory viral testing was all negative.  When she left the emergency room then her boyfriend called her and let her know that he had tested positive for both gonorrhea and chlamydia.   She now desires treatment. Past Medical History:  Diagnosis Date   Anemia    Anxiety    Asthma    inhaler. 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   Seizures ([name])    last one in 2015 - on meds   Sickle cell trait ([name])    TBI (traumatic brain injury) ([name]) 2006    Patient Active Problem List   Diagnosis Date Noted   OCD (obsessive compulsive disorder) 11/23/2022   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   Delivery by emergency cesarean 06/05/2022    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   Vaginal bleeding during pregnancy 05/28/2022   Gonorrhea affecting pregnancy in second trimester 05/18/2022   ?fetal pericardial effusion 05/18/2022   [redacted] weeks gestation of pregnancy    Preterm premature rupture of membranes (PPROM) with unknown onset of labor 05/17/2022   Oligohydramnios antepartum 05/11/2022   Abnormal antenatal AFP screen 04/12/2022   History of cesarean section 04/07/2022   Bartholin cyst 04/07/2022   History of herpes genitalis 04/07/2022   Supervision of high risk pregnancy, antepartum 02/08/2022   Trichomonal vaginitis during pregnancy in second trimester 02/01/2022   History of postpartum depression 11/16/2021   Abnormal involuntary movements 08/04/2020   Migraine with aura and without status migrainosus, not intractable 08/12/2019   Seizures ([name])    Sickle cell trait ([name]) 02/13/2019   Rh negative state in antepartum period 02/05/2019   Short interval between pregnancies affecting pregnancy in second trimester, antepartum 02/05/2019   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   DMDD (disruptive mood dysregulation disorder) ([name]) 07/21/2016   MDD (major depressive disorder), recurrent severe, 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   Chronic constipation 08/03/2015   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    Past Surgical History:  Procedure Laterality Date   APPENDECTOMY     CESAREAN SECTION N/A 08/31/2021  Procedure: CESAREAN SECTION;  Surgeon: Kathrynn Running, MD;  Location: [facility];  Service: Obstetrics;  Laterality: N/A;   CESAREAN SECTION N/A 06/05/2022   Procedure: CESAREAN SECTION;  Surgeon: Hermina Staggers, MD;  Location: [facility];  Service: Obstetrics;  Laterality: N/A;   HERNIA REPAIR     INTESTINAL MALROTATION REPAIR  2001    OB History     Gravida  4   Para  3   Term   2   Preterm  1   AB  1   Living  3      SAB  1   IAB      Ectopic      Multiple  0   Live Births  3            Home Medications    Prior to Admission medications   Medication Sig Start Date End Date Taking? Authorizing Provider  albuterol (VENTOLIN HFA) 108 (90 Base) MCG/ACT inhaler Inhale 1 puff into the lungs 4 (four) times daily as needed for wheezing or shortness of breath.    [provider]  benzonatate (TESSALON) 100 MG capsule Take 1 capsule (100 mg total) by mouth every 8 (eight) hours. 02/18/23   Jeanelle Malling, PA  benzonatate (TESSALON) 100 MG capsule Take 1 capsule (100 mg total) by mouth every 8 (eight) hours. 02/18/23   Jeanelle Malling, PA  busPIRone (BUSPAR) 7.5 MG tablet Take 1 tablet (7.5 mg total) by mouth 3 (three) times daily. 01/24/23 01/24/24  Meta Hatchet, PA  cetirizine (ZYRTEC ALLERGY) 10 MG tablet Take 1 tablet (10 mg total) by mouth daily for 10 days. 02/18/23 02/28/23  Jeanelle Malling, PA  doxycycline (VIBRAMYCIN) 100 MG capsule Take 1 capsule (100 mg total) by mouth 2 (two) times daily for 14 days. 02/17/23 03/03/23  Antony Madura, PA-C  escitalopram (LEXAPRO) 5 MG tablet Take 1 tablet (5 mg total) by mouth daily. 01/24/23 01/24/24  Karel Jarvis E, PA  ferrous sulfate 325 (65 FE) MG tablet Take 1 tablet (325 mg total) by mouth at bedtime. Patient taking differently: Take 325 mg by mouth every other day. 04/14/22   Marylene Land, CNM  hydrOXYzine (ATARAX) 50 MG tablet Take 1 tablet (50 mg total) by mouth 3 (three) times daily as needed for anxiety. 01/24/23   Nwoko, Tommas Olp, PA  medroxyPROGESTERone Acetate 150 MG/ML SUSY Inject 150 mg into the muscle every 3 (three) months. 08/08/22   [provider]  metroNIDAZOLE (FLAGYL) 500 MG tablet Take 1 tablet (500 mg total) by mouth 2 (two) times daily for 14 days. 02/17/23 03/03/23  Antony Madura, PA-C  risperiDONE (RISPERDAL) 0.5 MG tablet Take 1 tablet (0.5 mg total) by mouth at bedtime. 01/24/23 01/24/24   Nwoko, Tommas Olp, PA  valACYclovir (VALTREX) 1000 MG tablet Take 1 tablet (1,000 mg total) by mouth 2 (two) times daily. 05/22/22   Thressa Sheller D, CNM  rizatriptan (MAXALT-MLT) 10 MG disintegrating tablet Take 1 tablet at onset of migraine with 2 ibuprofen may repeat an additional tablet in 2 hours if needed 05/07/20 06/09/20  Deetta Perla, MD    Family History Family History  Problem Relation Age of Onset   Depression Mother    Stroke Mother    Obesity Mother    Post-traumatic stress disorder Mother    Anxiety disorder Mother    Hypertension Mother    Diabetes Mother    Schizophrenia Father    Kidney disease Father  Social History Social History   Tobacco Use   Smoking status: Every Day    Packs/day: .25    Types: Cigarettes   Smokeless tobacco: Never   Tobacco comments:    black and milds, 2 cigs daily  Vaping Use   Vaping Use: Former  Substance Use Topics   Alcohol use: Not Currently    Comment: occasionally, prior to pregnancy   Drug use: Not Currently    Types: Marijuana    Comment: last 2020     Allergies   Benadryl [diphenhydramine hcl], Haldol [haloperidol], Latex, Shellfish allergy, Zithromax [azithromycin], Camphor, Gluten meal, and Lactose intolerance (gi)   Review of Systems Review of Systems  Gastrointestinal:  Positive for abdominal pain.     Physical Exam Triage Vital Signs ED Triage Vitals [02/18/23 1251]  Enc Vitals Group     BP 109/78     Pulse Rate (!) 101     Resp 16     Temp 98.3 F (36.8 C)     Temp Source Oral     SpO2 98 %     Weight      Height      Head Circumference      Peak Flow      Pain Score 9     Pain Loc      Pain Edu?      Excl. in GC?    No data found.  Updated Vital Signs BP 109/78 (BP Location: Left Arm)   Pulse (!) 101   Temp 98.3 F (36.8 C) (Oral)   Resp 16   SpO2 98%   Visual Acuity Right Eye Distance:   Left Eye Distance:   Bilateral Distance:    Right Eye Near:   Left Eye Near:     Bilateral Near:     Physical Exam Vitals reviewed.  Constitutional:      General: She is not in acute distress.    Appearance: She is not ill-appearing, toxic-appearing or diaphoretic.  HENT:     Nose: Nose normal.     Mouth/Throat:     Mouth: Mucous membranes are moist.     Comments: There is some mild erythema of the tonsils with 1+ hypertrophy.   Eyes:     Extraocular Movements: Extraocular movements intact.     Conjunctiva/sclera: Conjunctivae normal.     Pupils: Pupils are equal, round, and reactive to light.  Cardiovascular:     Rate and Rhythm: Normal rate and regular rhythm.     Heart sounds: No murmur heard. Pulmonary:     Effort: Pulmonary effort is normal.     Breath sounds: Normal breath sounds.  Abdominal:     Palpations: Abdomen is soft.     Tenderness: There is abdominal tenderness (suprapubic).  Musculoskeletal:     Cervical back: Neck supple.  Skin:    Coloration: Skin is not jaundiced.  Neurological:     General: No focal deficit present.     Mental Status: She is alert and oriented to person, place, and time.  Psychiatric:        Behavior: Behavior normal.      UC Treatments / Results  Labs (all labs ordered are listed, but only abnormal results are displayed) Labs Reviewed - No data to display  EKG   Radiology DG Chest 2 View  Result Date: 02/18/2023 CLINICAL DATA:  Cough and shortness of breath. EXAM: CHEST - 2 VIEW COMPARISON:  Chest radiographs 12/16/2022 FINDINGS: The patient is mildly rightward  rotated. Cardiac silhouette and mediastinal contours are within normal limits. The lungs are clear. No pleural effusion or pneumothorax. No acute skeletal abnormality. IMPRESSION: No active cardiopulmonary disease. Electronically Signed   By: Neita Garnet M.D.   On: 02/18/2023 09:50    Procedures Procedures (including critical care time)  Medications Ordered in UC Medications  cefTRIAXone (ROCEPHIN) injection 500 mg (has no administration  in time range)    Initial Impression / Assessment and Plan / UC Course  I have reviewed the triage vital signs and the nursing notes.  Pertinent labs & imaging results that were available during my care of the patient were reviewed by me and considered in my medical decision making (see chart for details).     Rocephin 500 mg is given here in clinic.  Prescriptions have already been sent for doxycycline and trichomonas.  We discussed what each of these is for antibiotic to please pick them up and start taking those also.   Final Clinical Impressions(s) / UC Diagnoses   Final diagnoses:  PID (acute pelvic inflammatory disease)     Discharge Instructions      You have been given a shot of ceftriaxone 500 mg; this is the treatment for gonorrhea  Take doxycycline 100 mg --1 capsule 2 times daily for 7 days; this is the treatment for chlamydia  Take metronidazole 500 mg--1 tablet 2 times daily for 7 days.  Avoid drinking alcohol within 72 hours of taking this medication; this is the treatment for trichomonas.  Please refrain from intercourse for at least 1 week after everyone has finished treatment for the infections.     ED Prescriptions   None    PDMP not reviewed this encounter.   Zenia Resides, MD 02/18/23 [phone]

## 2023-02-18 NOTE — Discharge Instructions (Signed)
Please take your medications as prescribed. Take tylenol/ibuprofen, zyrtec and Tessalon Perles for symptom relief. I recommend close follow-up with PCP for reevaluation.  Please do not hesitate to return to emergency department if worrisome signs symptoms we discussed become apparent.

## 2023-02-18 NOTE — ED Notes (Signed)
Patient transported to X-ray 

## 2023-02-18 NOTE — Discharge Instructions (Signed)
You have been given a shot of ceftriaxone 500 mg; this is the treatment for gonorrhea  Take doxycycline 100 mg --1 capsule 2 times daily for 7 days; this is the treatment for chlamydia  Take metronidazole 500 mg--1 tablet 2 times daily for 7 days.  Avoid drinking alcohol within 72 hours of taking this medication; this is the treatment for trichomonas.  Please refrain from intercourse for at least 1 week after everyone has finished treatment for the infections.

## 2023-02-18 NOTE — ED Provider Notes (Signed)
[place] EMERGENCY DEPARTMENT AT [facility] Provider Note   CSN: [id number] Arrival date & time: 02/18/23  0544     History  Chief Complaint  Patient presents with   Fever    Melissa [name] is a 22 y.o. female with a past medical history of asthma, PID, seizure, sickle cell trait presents today for evaluation of flulike symptoms states she has been having a nonproductive nonbloody cough in the last 3 to 4 days.  Reports subjective fevers at home with cold/chills.  Patient was seen yesterday for STI and PID.  She declined treatment with Rocephin and wished to wait until the results of her gonorrhea and chlamydia results.  Patient is sexually active.  Fever     Past Medical History:  Diagnosis Date   Anemia    Anxiety    Asthma    inhaler. 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   Seizures ([name])    last one in 2015 - on meds   Sickle cell trait ([name])    TBI (traumatic brain injury) ([name]) 2006   Past Surgical History:  Procedure Laterality Date   APPENDECTOMY     CESAREAN SECTION N/A 08/31/2021   Procedure: CESAREAN SECTION;  Surgeon: Kathrynn Running, MD;  Location: [facility] LD [facility]S;  Service: Obstetrics;  Laterality: N/A;   CESAREAN SECTION N/A 06/05/2022   Procedure: CESAREAN SECTION;  Surgeon: Hermina Staggers, MD;  Location: [facility] LD ORS;  Service: Obstetrics;  Laterality: N/A;   HERNIA REPAIR     INTESTINAL MALROTATION REPAIR  2001      Home Medications Prior to Admission medications   Medication Sig Start Date End Date Taking? Authorizing Provider  albuterol (VENTOLIN HFA) 108 (90 Base) MCG/ACT inhaler Inhale 1 puff into the lungs 4 (four) times daily as needed for wheezing or shortness of breath.    [provider]  busPIRone (BUSPAR) 7.5 MG tablet Take 1 tablet (7.5 mg total) by mouth 3 (three) times daily. 01/24/23 01/24/24  Meta Hatchet, PA  doxycycline (VIBRAMYCIN) 100 MG capsule Take 1 capsule (100 mg total) by mouth 2 (two) times daily for 14 days. 02/17/23 03/03/23  Antony Madura, PA-C  escitalopram (LEXAPRO) 5 MG tablet Take 1 tablet (5 mg total) by mouth daily. 01/24/23 01/24/24  Karel Jarvis E, PA  ferrous sulfate 325 (65 FE) MG tablet Take 1 tablet (325 mg total) by mouth at bedtime. Patient taking differently: Take 325 mg by mouth every other day. 04/14/22   Marylene Land, CNM  hydrOXYzine (ATARAX) 50 MG tablet Take 1 tablet (50 mg total) by mouth 3 (three) times daily as needed for anxiety. 01/24/23   Nwoko, Tommas Olp, PA  medroxyPROGESTERone Acetate 150 MG/ML SUSY Inject 150 mg into the muscle every 3 (three) months. 08/08/22   [provider]  metroNIDAZOLE (FLAGYL) 500 MG tablet Take 1 tablet (500 mg total) by mouth 2 (two) times daily for 14 days. 02/17/23 03/03/23  Humes,  Tresa Endo, PA-C  risperiDONE (RISPERDAL) 0.5 MG tablet Take 1 tablet (0.5 mg total) by mouth at bedtime. 01/24/23 01/24/24  Nwoko, Tommas Olp, PA  valACYclovir (VALTREX) 1000 MG tablet Take 1 tablet (1,000 mg total) by mouth 2 (two) times daily. 05/22/22   Thressa Sheller D, CNM  rizatriptan (MAXALT-MLT) 10 MG disintegrating tablet Take 1 tablet at onset of migraine with 2 ibuprofen may repeat an additional tablet in 2 hours if needed 05/07/20 06/09/20  Deetta Perla, MD      Allergies    Benadryl [diphenhydramine hcl], Haldol [haloperidol], Latex, Shellfish allergy, Zithromax [azithromycin], Camphor, Gluten meal, and  Lactose intolerance (gi)    Review of Systems   Review of Systems  Constitutional:  Positive for fever.    Physical Exam Updated Vital Signs BP 121/78 (BP Location: Right Arm)   Pulse (!) 111   Temp 98.5 F (36.9 C) (Oral)   Resp 19   Ht 5\' 7"  (1.702 m)   Wt 108.4 kg   SpO2 100%   BMI 37.43 kg/m  Physical Exam Vitals and nursing note reviewed.  Constitutional:      Appearance: Normal appearance.  HENT:     Head: Normocephalic and atraumatic.     Mouth/Throat:     Mouth: Mucous membranes are moist.  Eyes:     General: No scleral icterus. Cardiovascular:     Rate and Rhythm: Normal rate and regular rhythm.     Pulses: Normal pulses.     Heart sounds: Normal heart sounds.  Pulmonary:     Effort: Pulmonary effort is normal.     Breath sounds: Normal breath sounds.  Abdominal:     General: Abdomen is flat.     Palpations: Abdomen is soft.     Tenderness: There is no abdominal tenderness.  Musculoskeletal:        General: No deformity.  Skin:    General: Skin is warm.     Findings: No rash.  Neurological:     General: No focal deficit present.     Mental Status: She is alert.  Psychiatric:        Mood and Affect: Mood normal.     ED Results / Procedures / Treatments   Labs (all labs ordered are listed, but only abnormal results are displayed) Labs Reviewed - No data to display  EKG None  Radiology No results found.  Procedures Procedures    Medications Ordered in ED Medications - No data to display  ED Course/ Medical Decision Making/ A&P                             Medical Decision Making Amount and/or Complexity of Data Reviewed Radiology: ordered.  Risk OTC drugs. Prescription drug management.   This patient presents to the ED for flulike symptoms, this involves an extensive number of treatment options, and is a complaint that carries with a high risk of complications and morbidity.  The differential diagnosis includes COVID, flu, RSV,  strep, pneumonia.  This is not an exhaustive list.  Lab tests: I ordered and personally interpreted labs.  The pertinent results include: Viral panel negative.  Group A strep negative.  Imaging studies: I ordered imaging studies. I personally reviewed, interpreted imaging and agree with the radiologist's interpretations. The results include: chest X-ray with no acute cardiopulmonary abnormalities.  Problem list/ ED course/ Critical interventions/ Medical management: HPI: See above Vital signs within normal range and stable throughout  visit. Laboratory/imaging studies significant for: See above. On physical examination, patient is afebrile and appears in no acute distress. This patient presents with symptoms suspicious for viral upper respiratory. Based on history and physical doubt sinusitis. COVID test was negative. Do not suspect underlying cardiopulmonary process.  Chest x-ray is negative for pneumonia. Patient is nontoxic appearing and not in need of emergent medical intervention. Recommended patient to take TheraFlu or Mucinex for symptom relief.  Patient was evaluated here in the ER yesterday.  States she is still waiting for her GC/chlamydia test to be back before starting doxycycline.  I told the patient to start taking Flagyl for trichomonas.  Follow-up with primary care physician or health department for further evaluation and management.  Return to the ER if new or worsening symptoms. I have reviewed the patient home medicines and have made adjustments as needed. I have reviewed the patient home medicines and have made adjustments as needed.  Cardiac monitoring/EKG: The patient was maintained on a cardiac monitor.  I personally reviewed and interpreted the cardiac monitor which showed an underlying rhythm of: sinus rhythm.  Additional history obtained: External records from outside source obtained and reviewed including: Chart review including previous notes, labs,  imaging.  Consultations obtained:  Disposition Continued outpatient therapy. Follow-up with PCP recommended for reevaluation of symptoms. Treatment plan discussed with patient.  Pt acknowledged understanding was agreeable to the plan. Worrisome signs and symptoms were discussed with patient, and patient acknowledged understanding to return to the ED if they noticed these signs and symptoms. Patient was stable upon discharge.   This chart was dictated using voice recognition software.  Despite best efforts to proofread,  errors can occur which can change the documentation meaning.          Final Clinical Impression(s) / ED Diagnoses Final diagnoses:  Viral upper respiratory tract infection    Rx / DC Orders ED Discharge Orders          Ordered    benzonatate (TESSALON) 100 MG capsule  Every 8 hours,   Status:  Discontinued        02/18/23 0958    benzonatate (TESSALON) 100 MG capsule  Every 8 hours        02/18/23 1000    cetirizine (ZYRTEC ALLERGY) 10 MG tablet  Daily        02/18/23 1000              Jeanelle Malling, [place] 02/18/23 1017    Benjiman Core, MD 02/18/23 1446

## 2023-02-19 LAB — GC/CHLAMYDIA PROBE AMP (~~LOC~~) NOT AT ARMC
Chlamydia: POSITIVE — AB
Comment: NEGATIVE
Comment: NORMAL
Neisseria Gonorrhea: NEGATIVE

## 2023-03-07 ENCOUNTER — Encounter (HOSPITAL_COMMUNITY): Payer: 59 | Admitting: Physician Assistant

## 2023-03-14 ENCOUNTER — Encounter (HOSPITAL_COMMUNITY): Payer: Self-pay | Admitting: Physician Assistant

## 2023-03-14 ENCOUNTER — Ambulatory Visit (INDEPENDENT_AMBULATORY_CARE_PROVIDER_SITE_OTHER): Payer: 59 | Admitting: Physician Assistant

## 2023-03-14 DIAGNOSIS — F429 Obsessive-compulsive disorder, unspecified: Secondary | ICD-10-CM | POA: Diagnosis not present

## 2023-03-14 DIAGNOSIS — F411 Generalized anxiety disorder: Secondary | ICD-10-CM

## 2023-03-14 DIAGNOSIS — F53 Postpartum depression: Secondary | ICD-10-CM

## 2023-03-14 DIAGNOSIS — F333 Major depressive disorder, recurrent, severe with psychotic symptoms: Secondary | ICD-10-CM

## 2023-03-14 MED ORDER — RISPERIDONE 0.5 MG PO TABS
0.5000 mg | ORAL_TABLET | Freq: Every day | ORAL | 1 refills | Status: DC
Start: 2023-03-14 — End: 2023-04-03

## 2023-03-14 MED ORDER — ESCITALOPRAM OXALATE 5 MG PO TABS
5.0000 mg | ORAL_TABLET | Freq: Every day | ORAL | 1 refills | Status: DC
Start: 2023-03-14 — End: 2023-04-03

## 2023-03-14 MED ORDER — BUSPIRONE HCL 7.5 MG PO TABS
7.5000 mg | ORAL_TABLET | Freq: Three times a day (TID) | ORAL | 1 refills | Status: DC
Start: 2023-03-14 — End: 2023-04-03

## 2023-03-14 MED ORDER — HYDROXYZINE HCL 50 MG PO TABS
50.0000 mg | ORAL_TABLET | Freq: Three times a day (TID) | ORAL | 1 refills | Status: DC | PRN
Start: 2023-03-14 — End: 2023-09-04

## 2023-03-14 NOTE — Progress Notes (Signed)
[facility] MD/PA/NP OP Progress Note  03/14/2023 3:48 PM Melissa [name]  MRN:  [id number]  Chief Complaint:  Chief Complaint  Patient presents with   Follow-up   Medication Refill   HPI: ***  Melissa [name]   Visit Diagnosis:    ICD-10-CM   1. GAD (generalized anxiety disorder)  F41.1 busPIRone (BUSPAR) 7.5 MG tablet    hydrOXYzine (ATARAX) 50 MG tablet    escitalopram (LEXAPRO) 5 MG tablet    2. Post partum depression  F53.0 escitalopram (LEXAPRO) 5 MG tablet    3. Severe recurrent major depression with psychotic features ([name])  F33.3 escitalopram (LEXAPRO) 5 MG tablet    risperiDONE (RISPERDAL) 0.5 MG tablet    4. Obsessive-compulsive disorder, unspecified type  F42.9 escitalopram (LEXAPRO) 5 MG tablet      Past Psychiatric History:  Patient has a past psychiatric history significant for history of postpartum depression, major depressive disorder with psychotic features, generalized anxiety disorder, and obsessive-compulsive disorder   Patient has a past history of hospitalization due to mental health stating that she was hospitalized at [facility] last year due to suicidal ideation that were brought upon by anger against her parent   Patient endorses a past history of suicide attempt she last attempted back in 2016 via drug overdose   Patient denies a past history of homicide attempt  Past Medical History:  Past Medical History:  Diagnosis Date   Anemia    Anxiety    Asthma    inhaler. last attack June 2019   Complication of anesthesia    Congenital hydronephrosis 2001   Constipation    Depression    Episodic tension-type headache, not intractable 04/16/2015   Family history of adverse reaction to anesthesia    mom rushed to hospital from Dentist office, pt her mother & grandmother have trouble waking fully after anesthesia   Genital herpes    Low back strain, sequela 11/10/2020   Migraine without aura and without status  migrainosus, not intractable 04/16/2015   PID (pelvic inflammatory disease)    Post-partum depression 11/17/2021   Seizures ([name])    last one in 2015 - on meds   Sickle cell trait ([name])    TBI (traumatic brain injury) ([name]) 2006    Past Surgical History:  Procedure Laterality Date   APPENDECTOMY     CESAREAN SECTION N/A 08/31/2021   Procedure: CESAREAN SECTION;  Surgeon: Kathrynn Running, MD;  Location: [facility] LD [facility];  Service: Obstetrics;  Laterality: N/A;   CESAREAN SECTION N/A 06/05/2022   Procedure: CESAREAN SECTION;  Surgeon: Hermina Staggers, MD;  Location: [facility] LD [facility]S;  Service: Obstetrics;  Laterality: N/A;   HERNIA REPAIR     INTESTINAL MALROTATION REPAIR  2001    Family Psychiatric History:  Father - Schizophrenia Grandmother (maternal) - bipolar disorder   History of suicide attempts: Patient denies Family history of homicide attempts: Patient denies Family history of substance abuse: Patient reports that her grandmother and grandfather abused alcohol and cocaine.  She reports that her sister also abused alcohol and cocaine.  Family History:  Family History  Problem Relation Age of Onset   Depression Mother    Stroke Mother    Obesity Mother    Post-traumatic stress disorder Mother    Anxiety disorder Mother    Hypertension Mother    Diabetes Mother    Schizophrenia Father    Kidney disease Father     Social History:  Social History  Socioeconomic History   Marital status: Single    Spouse name: Not on file   Number of children: Not on file   Years of education: Not on file   Highest education level: Not on file  Occupational History   Not on file  Tobacco Use   Smoking status: Every Day    Current packs/day: 0.25    Types: Cigarettes   Smokeless tobacco: Never   Tobacco comments:    black and milds, 2 cigs daily  Vaping Use   Vaping status: Former  Substance and Sexual Activity   Alcohol use: Not Currently    Comment: occasionally, prior to  pregnancy   Drug use: Not Currently    Types: Marijuana    Comment: last 2020   Sexual activity: Not Currently    Partners: Male    Birth control/protection: None  Other Topics Concern   Not on file  Social History Narrative   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   She attended [employer].    She lives with her parents, siblings, and grandfather.    She enjoys writing, singing, dancing, cooking, and shopping.   She attends [employer].   Social Determinants of Health   Financial Resource Strain: Not on file  Food Insecurity: Food Insecurity Present (11/14/2022)   Hunger Vital Sign    Worried About Running Out of Food in the Last Year: Sometimes true    Ran Out of Food in the Last Year: Sometimes true  Transportation Needs: No Transportation Needs (11/14/2022)   PRAPARE - Administrator, Civil Service (Medical): No    Lack of Transportation (Non-Medical): No  Recent Concern: Transportation Needs - Unmet Transportation Needs (11/14/2022)   PRAPARE - Transportation    Lack of Transportation (Medical): Yes    Lack of Transportation (Non-Medical): Yes  Physical Activity: Not on file  Stress: Stress Concern Present (08/08/2022)   Received from [facility], [facility] of Occupational Health - Occupational Stress Questionnaire    Feeling of Stress : To some extent  Social Connections: Unknown (01/03/2022)   Received from [facility], [facility]   Social Network    Social Network: Not on file    Allergies:  Allergies  Allergen Reactions   Benadryl [Diphenhydramine Hcl] Other (See Comments)    Causes seizures    Haldol [Haloperidol] Anaphylaxis    Tongue swelling.   Latex Swelling    Labial edema after usage of latex condoms.    Shellfish Allergy Anaphylaxis and Other (See Comments)    Patient states her throat gets tight   Zithromax [Azithromycin] Anaphylaxis and Swelling   Camphor Other (See Comments)    unknown    Gluten Meal Other (See Comments)    GI Intolerance   Lactose Intolerance (Gi) Diarrhea    Metabolic Disorder Labs: Lab Results  Component Value Date   HGBA1C 5.3 11/14/2022   MPG 105 11/14/2022   MPG 93.93 11/19/2021   Lab Results  Component Value Date   PROLACTIN 14.4 11/14/2022   PROLACTIN 8.8 01/01/2017   Lab Results  Component Value Date   CHOL 153 11/14/2022   TRIG 75 11/14/2022   HDL 37 (L) 11/14/2022   CHOLHDL 4.1 11/14/2022   VLDL 15 11/14/2022   LDLCALC 101 (H) 11/14/2022   LDLCALC 105 (H) 11/19/2021   Lab Results  Component Value Date   TSH 1.093 11/14/2022   TSH 0.422 11/16/2021    Therapeutic Level  Labs: No results found for: "LITHIUM" Lab Results  Component Value Date   VALPROATE 23.6 HEMOLYZED SPECIMEN, RESULTS MAY BE AFFECTED (L) 02/01/2007   Lab Results  Component Value Date   CBMZ <2.0 (L) 01/01/2016   CBMZ 8.0 02/16/2014    Current Medications: Current Outpatient Medications  Medication Sig Dispense Refill   albuterol (VENTOLIN HFA) 108 (90 Base) MCG/ACT inhaler Inhale 1 puff into the lungs 4 (four) times daily as needed for wheezing or shortness of breath.     benzonatate (TESSALON) 100 MG capsule Take 1 capsule (100 mg total) by mouth every 8 (eight) hours. 21 capsule 0   benzonatate (TESSALON) 100 MG capsule Take 1 capsule (100 mg total) by mouth every 8 (eight) hours. 21 capsule 0   busPIRone (BUSPAR) 7.5 MG tablet Take 1 tablet (7.5 mg total) by mouth 3 (three) times daily. 90 tablet 1   cetirizine (ZYRTEC ALLERGY) 10 MG tablet Take 1 tablet (10 mg total) by mouth daily for 10 days. 10 tablet 0   escitalopram (LEXAPRO) 5 MG tablet Take 1 tablet (5 mg total) by mouth daily. 30 tablet 1   ferrous sulfate 325 (65 FE) MG tablet Take 1 tablet (325 mg total) by mouth at bedtime. (Patient taking differently: Take 325 mg by mouth every other day.) 30 tablet 3   hydrOXYzine (ATARAX) 50 MG tablet Take 1 tablet (50 mg total) by mouth 3 (three) times  daily as needed for anxiety. 75 tablet 1   medroxyPROGESTERone Acetate 150 MG/ML SUSY Inject 150 mg into the muscle every 3 (three) months.     risperiDONE (RISPERDAL) 0.5 MG tablet Take 1 tablet (0.5 mg total) by mouth at bedtime. 30 tablet 1   valACYclovir (VALTREX) 1000 MG tablet Take 1 tablet (1,000 mg total) by mouth 2 (two) times daily. 20 tablet 5   No current facility-administered medications for this visit.     Musculoskeletal: Strength & Muscle Tone: within normal limits Gait & Station: normal Patient leans: N/A  Psychiatric Specialty Exam: Review of Systems  Psychiatric/Behavioral:  Negative for decreased concentration, dysphoric mood, hallucinations, self-injury, sleep disturbance and suicidal ideas. The patient is not nervous/anxious and is not hyperactive.     Blood pressure 118/78, pulse 77, temperature 98.2 F (36.8 C), temperature source Oral, height 5\' 7"  (1.702 m), weight 237 lb (107.5 kg), SpO2 100%, unknown if currently breastfeeding.Body mass index is 37.12 kg/m.  General Appearance: Casual  Eye Contact:  Fair  Speech:  Clear and Coherent and Normal Rate  Volume:  Normal  Mood:  Depressed  Affect:  Appropriate  Thought Process:  Coherent, Goal Directed, and Descriptions of Associations: Intact  Orientation:  Full (Time, Place, and Person)  Thought Content: WDL   Suicidal Thoughts:  No  Homicidal Thoughts:  No  Memory:  Immediate;   Good Recent;   Good Remote;   Fair  Judgement:  Good  Insight:  Good  Psychomotor Activity:  Normal  Concentration:  Concentration: Good and Attention Span: Good  Recall:  Good  Fund of Knowledge: Good  Language: Good  Akathisia:  No  Handed:  Right  AIMS (if indicated): not done  Assets:  Communication Skills Desire for Improvement Social Support Vocational/Educational  ADL's:  Intact  Cognition: WNL  Sleep:  Good   Screenings: AIMS    Flowsheet Row [facility] Admission (Discharged) [facility]  AIMS Total Score 0  0      AUDIT    Flowsheet Row ED to Hosp-Admission (Discharged) [facility] Admission (Discharged) from [facility]24[facility]  Alcohol Use Disorder Identification Test Final Score (AUDIT) 2 0      GAD-7    Flowsheet Row Clinical Support from 03/14/2023 in [facility] Office Visit from 01/24/2023 in [facility] Clinical Support from 02/08/2022 in [facility] for [facility] [place] Office Visit from 12/15/2021 in [facility] Routine Prenatal from 08/09/2021 in [facility] for [facility] [place]  Total GAD-7 Score 15 19 8 21 9       PHQ2-9    Flowsheet Row Clinical Support from 03/14/2023 in [facility] Office Visit from 01/24/2023 in [facility] Integrated [facility]al Health from 04/07/2022 in [facility] for [facility] [place] [facility] Clinical Support from 02/08/2022 in [facility] [place] Office Visit from 12/15/2021 in [facility]  PHQ-2 Total Score 3 3 2 4 1   PHQ-9 Total Score 13 19 5 9  --      Flowsheet Row Clinical Support from 03/14/2023 in [facility] Most recent reading at 03/14/2023  9:21 AM ED from 02/18/2023 in [facility] [place] Most recent reading at 02/18/2023 12:54 PM ED from 02/18/2023 in [facility] Emergency Department at [facility] Most recent reading at 02/18/2023  5:49 AM  C-SSRS RISK CATEGORY Low Risk No Risk No Risk        Assessment and Plan: ***  Collaboration of Care: Collaboration of Care: [facility] OP Collaboration of  Care:[id number]}  Patient/Guardian was advised Release of Information must be obtained prior to any record release in order to collaborate their care with an outside provider. Patient/Guardian was advised if they have not already done so to contact the registration department to sign all necessary forms in order for [place] to release information regarding their care.   Consent: Patient/Guardian gives verbal consent for treatment and assignment of benefits for services provided during this visit. Patient/Guardian expressed understanding and agreed to proceed.    Meta Hatchet, PA 03/14/2023, 3:48 PM

## 2023-03-29 ENCOUNTER — Other Ambulatory Visit: Payer: Self-pay

## 2023-03-29 ENCOUNTER — Encounter (HOSPITAL_COMMUNITY): Payer: Self-pay

## 2023-03-29 ENCOUNTER — Encounter (HOSPITAL_COMMUNITY): Payer: Self-pay | Admitting: Psychiatry

## 2023-03-29 ENCOUNTER — Emergency Department (HOSPITAL_COMMUNITY)
Admission: EM | Admit: 2023-03-29 | Discharge: 2023-03-29 | Disposition: A | Discharge status: Other Acute Inpatient Hospital | Payer: 59 | Attending: Emergency Medicine | Admitting: Emergency Medicine

## 2023-03-29 ENCOUNTER — Inpatient Hospital Stay (HOSPITAL_COMMUNITY)
Admission: AD | Admit: 2023-03-29 | Discharge: 2023-04-03 | DRG: 885 | Disposition: A | Discharge status: Home / Self Care | Payer: 59 | Source: Intra-hospital | Attending: Psychiatry | Admitting: Psychiatry

## 2023-03-29 DIAGNOSIS — R9431 Abnormal electrocardiogram [ECG] [EKG]: Secondary | ICD-10-CM | POA: Diagnosis not present

## 2023-03-29 DIAGNOSIS — R569 Unspecified convulsions: Secondary | ICD-10-CM | POA: Diagnosis not present

## 2023-03-29 DIAGNOSIS — F411 Generalized anxiety disorder: Secondary | ICD-10-CM | POA: Diagnosis not present

## 2023-03-29 DIAGNOSIS — Z91148 Patient's other noncompliance with medication regimen for other reason: Secondary | ICD-10-CM

## 2023-03-29 DIAGNOSIS — Z8249 Family history of ischemic heart disease and other diseases of the circulatory system: Secondary | ICD-10-CM | POA: Diagnosis not present

## 2023-03-29 DIAGNOSIS — F333 Major depressive disorder, recurrent, severe with psychotic symptoms: Secondary | ICD-10-CM | POA: Diagnosis present

## 2023-03-29 DIAGNOSIS — J45909 Unspecified asthma, uncomplicated: Secondary | ICD-10-CM | POA: Diagnosis not present

## 2023-03-29 DIAGNOSIS — F17210 Nicotine dependence, cigarettes, uncomplicated: Secondary | ICD-10-CM | POA: Insufficient documentation

## 2023-03-29 DIAGNOSIS — Z79899 Other long term (current) drug therapy: Secondary | ICD-10-CM

## 2023-03-29 DIAGNOSIS — Z833 Family history of diabetes mellitus: Secondary | ICD-10-CM | POA: Diagnosis not present

## 2023-03-29 DIAGNOSIS — Z9151 Personal history of suicidal behavior: Secondary | ICD-10-CM | POA: Diagnosis not present

## 2023-03-29 DIAGNOSIS — Z823 Family history of stroke: Secondary | ICD-10-CM

## 2023-03-29 DIAGNOSIS — Z5900 Homelessness unspecified: Secondary | ICD-10-CM | POA: Insufficient documentation

## 2023-03-29 DIAGNOSIS — K5900 Constipation, unspecified: Secondary | ICD-10-CM | POA: Diagnosis not present

## 2023-03-29 DIAGNOSIS — F332 Major depressive disorder, recurrent severe without psychotic features: Secondary | ICD-10-CM | POA: Diagnosis not present

## 2023-03-29 DIAGNOSIS — G43009 Migraine without aura, not intractable, without status migrainosus: Secondary | ICD-10-CM | POA: Diagnosis present

## 2023-03-29 DIAGNOSIS — Z91040 Latex allergy status: Secondary | ICD-10-CM | POA: Insufficient documentation

## 2023-03-29 DIAGNOSIS — F3160 Bipolar disorder, current episode mixed, unspecified: Secondary | ICD-10-CM | POA: Diagnosis not present

## 2023-03-29 DIAGNOSIS — Z841 Family history of disorders of kidney and ureter: Secondary | ICD-10-CM | POA: Diagnosis not present

## 2023-03-29 DIAGNOSIS — Z87820 Personal history of traumatic brain injury: Secondary | ICD-10-CM

## 2023-03-29 DIAGNOSIS — D573 Sickle-cell trait: Secondary | ICD-10-CM | POA: Diagnosis present

## 2023-03-29 DIAGNOSIS — R45851 Suicidal ideations: Secondary | ICD-10-CM | POA: Diagnosis present

## 2023-03-29 DIAGNOSIS — Z818 Family history of other mental and behavioral disorders: Secondary | ICD-10-CM

## 2023-03-29 LAB — COMPREHENSIVE METABOLIC PANEL
ALT: 16 U/L (ref 0–44)
AST: 18 U/L (ref 15–41)
Albumin: 3.8 g/dL (ref 3.5–5.0)
Alkaline Phosphatase: 95 U/L (ref 38–126)
Anion gap: 12 (ref 5–15)
BUN: <5 mg/dL — ABNORMAL LOW (ref 6–20)
CO2: 23 mmol/L (ref 22–32)
Calcium: 9.5 mg/dL (ref 8.9–10.3)
Chloride: 105 mmol/L (ref 98–111)
Creatinine, Ser: 0.93 mg/dL (ref 0.44–1.00)
GFR, Estimated: >60 mL/min (ref >60)
Glucose, Bld: 85 mg/dL (ref 70–99)
Potassium: 3.3 mmol/L — ABNORMAL LOW (ref 3.5–5.1)
Sodium: 140 mmol/L (ref 135–145)
Total Bilirubin: 0.2 mg/dL — ABNORMAL LOW (ref 0.3–1.2)
Total Protein: 7.3 g/dL (ref 6.5–8.1)

## 2023-03-29 LAB — CBC
HCT: 39.1 % (ref 36.0–46.0)
Hemoglobin: 13 g/dL (ref 12.0–15.0)
MCH: 26.3 pg (ref 26.0–34.0)
MCHC: 33.2 g/dL (ref 30.0–36.0)
MCV: 79.1 fL — ABNORMAL LOW (ref 80.0–100.0)
Platelets: 266 10*3/uL (ref 150–400)
RBC: 4.94 MIL/uL (ref 3.87–5.11)
RDW: 15.3 % (ref 11.5–15.5)
WBC: 8.9 10*3/uL (ref 4.0–10.5)
nRBC: 0 % (ref 0.0–0.2)

## 2023-03-29 LAB — RAPID URINE DRUG SCREEN, HOSP PERFORMED
Amphetamines: NOT DETECTED
Barbiturates: NOT DETECTED
Benzodiazepines: NOT DETECTED
Cocaine: NOT DETECTED
Opiates: NOT DETECTED
Tetrahydrocannabinol: NOT DETECTED

## 2023-03-29 LAB — SALICYLATE LEVEL: Salicylate Lvl: <7 mg/dL — ABNORMAL LOW (ref 7.0–30.0)

## 2023-03-29 LAB — HCG, SERUM, QUALITATIVE: Preg, Serum: NEGATIVE

## 2023-03-29 LAB — ETHANOL: Alcohol, Ethyl (B): <10 mg/dL

## 2023-03-29 LAB — WET PREP, GENITAL
Sperm: NONE SEEN
Trich, Wet Prep: NONE SEEN
WBC, Wet Prep HPF POC: <10
Yeast Wet Prep HPF POC: NONE SEEN

## 2023-03-29 LAB — ACETAMINOPHEN LEVEL: Acetaminophen (Tylenol), Serum: <10 ug/mL — ABNORMAL LOW (ref 10–30)

## 2023-03-29 MED ORDER — RISPERIDONE 0.5 MG PO TABS
0.5000 mg | ORAL_TABLET | Freq: Every day | ORAL | Status: DC
  Administered 2023-03-29 – 2023-03-30 (×2): 0.5 mg via ORAL
  Filled 2023-03-29 (×5): qty 1

## 2023-03-29 MED ORDER — DIPHENHYDRAMINE HCL 25 MG PO CAPS: 50.0000 mg | ORAL_CAPSULE | Freq: Three times a day (TID) | ORAL | Status: DC | PRN

## 2023-03-29 MED ORDER — ESCITALOPRAM OXALATE 10 MG PO TABS
5.0000 mg | ORAL_TABLET | Freq: Every day | ORAL | Status: DC
  Administered 2023-03-29: 5 mg via ORAL
  Filled 2023-03-29: qty 1

## 2023-03-29 MED ORDER — HALOPERIDOL LACTATE 5 MG/ML IJ SOLN: 5.0000 mg | Freq: Three times a day (TID) | INTRAMUSCULAR | Status: DC | PRN

## 2023-03-29 MED ORDER — TRAZODONE HCL 50 MG PO TABS
50.0000 mg | ORAL_TABLET | Freq: Every evening | ORAL | Status: DC | PRN
  Filled 2023-03-29 (×2): qty 1

## 2023-03-29 MED ORDER — BUSPIRONE HCL 15 MG PO TABS
7.5000 mg | ORAL_TABLET | Freq: Three times a day (TID) | ORAL | Status: DC
  Filled 2023-03-29 (×3): qty 1

## 2023-03-29 MED ORDER — HYDROXYZINE HCL 50 MG PO TABS
50.0000 mg | ORAL_TABLET | Freq: Three times a day (TID) | ORAL | Status: DC | PRN
  Administered 2023-03-29 – 2023-04-03 (×4): 50 mg via ORAL
  Filled 2023-03-29 (×4): qty 1

## 2023-03-29 MED ORDER — HALOPERIDOL 5 MG PO TABS: 5.0000 mg | ORAL_TABLET | Freq: Three times a day (TID) | ORAL | Status: DC | PRN

## 2023-03-29 MED ORDER — METRONIDAZOLE 500 MG PO TABS
500.0000 mg | ORAL_TABLET | Freq: Two times a day (BID) | ORAL | Status: DC
  Administered 2023-03-29: 500 mg via ORAL
  Filled 2023-03-29: qty 1

## 2023-03-29 MED ORDER — MAGNESIUM HYDROXIDE 400 MG/5ML PO SUSP: 30.0000 mL | Freq: Every day | ORAL | Status: DC | PRN

## 2023-03-29 MED ORDER — ZIPRASIDONE MESYLATE 20 MG IM SOLR: 20.0000 mg | Freq: Every day | INTRAMUSCULAR | Status: DC | PRN

## 2023-03-29 MED ORDER — HYDROXYZINE HCL 50 MG PO TABS: 50.0000 mg | ORAL_TABLET | Freq: Three times a day (TID) | ORAL | Status: DC | PRN

## 2023-03-29 MED ORDER — ALUM & MAG HYDROXIDE-SIMETH 200-200-20 MG/5ML PO SUSP: 30.0000 mL | ORAL | Status: DC | PRN

## 2023-03-29 MED ORDER — ACETAMINOPHEN 325 MG PO TABS
650.0000 mg | ORAL_TABLET | Freq: Four times a day (QID) | ORAL | Status: DC | PRN
  Administered 2023-03-29 – 2023-04-03 (×3): 650 mg via ORAL
  Filled 2023-03-29 (×3): qty 2

## 2023-03-29 MED ORDER — BUSPIRONE HCL 7.5 MG PO TABS
7.5000 mg | ORAL_TABLET | Freq: Three times a day (TID) | ORAL | Status: DC
  Administered 2023-03-29 – 2023-03-30 (×2): 7.5 mg via ORAL
  Filled 2023-03-29 (×8): qty 1

## 2023-03-29 MED ORDER — LORAZEPAM 1 MG PO TABS: 2.0000 mg | ORAL_TABLET | Freq: Three times a day (TID) | ORAL | Status: DC | PRN

## 2023-03-29 MED ORDER — DIPHENHYDRAMINE HCL 50 MG/ML IJ SOLN: 50.0000 mg | Freq: Three times a day (TID) | INTRAMUSCULAR | Status: DC | PRN

## 2023-03-29 MED ORDER — NICOTINE 14 MG/24HR TD PT24
14.0000 mg | MEDICATED_PATCH | Freq: Every day | TRANSDERMAL | Status: DC
  Filled 2023-03-29 (×7): qty 1

## 2023-03-29 MED ORDER — RISPERIDONE 0.25 MG PO TABS
0.5000 mg | ORAL_TABLET | Freq: Every day | ORAL | Status: DC
  Filled 2023-03-29: qty 2

## 2023-03-29 MED ORDER — ESCITALOPRAM OXALATE 5 MG PO TABS
5.0000 mg | ORAL_TABLET | Freq: Every day | ORAL | Status: DC
  Administered 2023-03-30: 5 mg via ORAL
  Filled 2023-03-29 (×3): qty 1

## 2023-03-29 MED ORDER — METRONIDAZOLE 500 MG PO TABS
500.0000 mg | ORAL_TABLET | Freq: Two times a day (BID) | ORAL | Status: DC
  Filled 2023-03-29 (×2): qty 1

## 2023-03-29 MED ORDER — LORAZEPAM 2 MG/ML IJ SOLN: 2.0000 mg | Freq: Three times a day (TID) | INTRAMUSCULAR | Status: DC | PRN

## 2023-03-29 NOTE — ED Notes (Signed)
Patient's belongings' placed into locker #3. RN is aware.

## 2023-03-29 NOTE — ED Notes (Signed)
TTS at bedside. 

## 2023-03-29 NOTE — ED Notes (Signed)
Patient refused EKG. Stated...Marland Kitchen"My chest doesn'y hurt." . Charge RN aware.

## 2023-03-29 NOTE — ED Provider Notes (Signed)
[place] EMERGENCY DEPARTMENT AT [facility] Provider Note   CSN: [id number] Arrival date & time: 03/29/23  0353     History  Chief Complaint  Patient presents with   Suicidal    Melissa [name] is a 22 y.o. female.  The history is provided by the patient and medical records.   22 y.o. F with hx of anxiety, migraine headaches, presenting to the ED with suicidal thoughts x1 week.  States she has been worrying a lot about her kids.  Apparently she lost custody of her children some time ago, they are currently living with her parents.  She is only allowed to have supervised visitation so sees them every few weeks or so.  She is currently homeless, living on the street or staying with friends when she can.  She states she had planned to OD on her medication or jump off a bridge today but friend talked her out of it.  Denies HI/AVH.  She is also requesting repeat STD testing.  She had trich and gonorrhea in June 2024 and wants to be sure that has cleared up.  Home Medications Prior to Admission medications   Medication Sig Start Date End Date Taking? Authorizing Provider  albuterol (VENTOLIN HFA) 108 (90 Base) MCG/ACT inhaler Inhale 1 puff into the lungs 4 (four) times daily as needed for wheezing or shortness of breath.    [provider]  benzonatate (TESSALON) 100 MG capsule Take 1 capsule (100 mg total) by mouth every 8 (eight) hours. 02/18/23   Jeanelle Malling, PA  benzonatate (TESSALON) 100 MG capsule Take 1 capsule (100 mg total) by mouth every 8 (eight) hours. 02/18/23   Jeanelle Malling, PA  busPIRone (BUSPAR) 7.5 MG tablet Take 1 tablet (7.5 mg total) by mouth 3 (three) times daily. 03/14/23 03/13/24  Nwoko, Tommas Olp, PA  cetirizine (ZYRTEC ALLERGY) 10 MG tablet Take 1 tablet (10 mg total) by mouth daily for 10 days. 02/18/23 02/28/23  Jeanelle Malling, PA  escitalopram (LEXAPRO) 5 MG tablet Take 1 tablet (5 mg total) by mouth daily. 03/14/23 03/13/24  Meta Hatchet, PA  ferrous  sulfate 325 (65 FE) MG tablet Take 1 tablet (325 mg total) by mouth at bedtime. Patient taking differently: Take 325 mg by mouth every other day. 04/14/22   Marylene Land, CNM  hydrOXYzine (ATARAX) 50 MG tablet Take 1 tablet (50 mg total) by mouth 3 (three) times daily as needed for anxiety. 03/14/23   Nwoko, Tommas Olp, PA  medroxyPROGESTERone Acetate 150 MG/ML SUSY Inject 150 mg into the muscle every 3 (three) months. 08/08/22   [provider]  risperiDONE (RISPERDAL) 0.5 MG tablet Take 1 tablet (0.5 mg total) by mouth at bedtime. 03/14/23 03/13/24  Nwoko, Tommas Olp, PA  valACYclovir (VALTREX) 1000 MG tablet Take 1 tablet (1,000 mg total) by mouth 2 (two) times daily. 05/22/22   Thressa Sheller D, CNM  rizatriptan (MAXALT-MLT) 10 MG disintegrating tablet Take 1 tablet at onset of migraine with 2 ibuprofen may repeat an additional tablet in 2 hours if needed 05/07/20 06/09/20  Deetta Perla, MD      Allergies    Benadryl [diphenhydramine hcl], Haldol [haloperidol], Latex, Shellfish allergy, Zithromax [azithromycin], Camphor, Gluten meal, and Lactose intolerance (gi)    Review of Systems   Review of Systems  Psychiatric/Behavioral:  Positive for suicidal ideas.   All other systems reviewed and are negative.   Physical Exam Updated Vital Signs BP 115/77 (BP Location: Right Arm)  Pulse 87   Temp 98.4 F (36.9 C) (Oral)   Resp 18   Ht 5\' 7"  (1.702 m)   Wt 108 kg   SpO2 100%   BMI 37.28 kg/m  Physical Exam Vitals and nursing note reviewed.  Constitutional:      Appearance: She is well-developed.  HENT:     Head: Normocephalic and atraumatic.  Eyes:     Conjunctiva/sclera: Conjunctivae normal.     Pupils: Pupils are equal, round, and reactive to light.  Cardiovascular:     Rate and Rhythm: Normal rate and regular rhythm.     Heart sounds: Normal heart sounds.  Pulmonary:     Effort: Pulmonary effort is normal.     Breath sounds: Normal breath sounds.   Abdominal:     General: Bowel sounds are normal.     Palpations: Abdomen is soft.  Musculoskeletal:        General: Normal range of motion.     Cervical back: Normal range of motion.  Skin:    General: Skin is warm and dry.  Neurological:     Mental Status: She is alert and oriented to person, place, and time.  Psychiatric:        Attention and Perception: She does not perceive auditory hallucinations.        Thought Content: Thought content includes suicidal ideation. Thought content does not include homicidal ideation. Thought content includes suicidal plan. Thought content does not include homicidal plan.     Comments: SI with plan to OD or jump off bridge Denies HI/AVH     ED Results / Procedures / Treatments   Labs (all labs ordered are listed, but only abnormal results are displayed) Labs Reviewed  WET PREP, GENITAL - Abnormal; Notable for the following components:      Result Value   Clue Cells Wet Prep HPF POC PRESENT (*)    All other components within normal limits  COMPREHENSIVE METABOLIC PANEL - Abnormal; Notable for the following components:   Potassium 3.3 (*)    BUN <5 (*)    Total Bilirubin 0.2 (*)    All other components within normal limits  SALICYLATE LEVEL - Abnormal; Notable for the following components:   Salicylate Lvl <7.0 (*)    All other components within normal limits  ACETAMINOPHEN LEVEL - Abnormal; Notable for the following components:   Acetaminophen (Tylenol), Serum <10 (*)    All other components within normal limits  CBC - Abnormal; Notable for the following components:   MCV 79.1 (*)    All other components within normal limits  ETHANOL  RAPID URINE DRUG SCREEN, HOSP PERFORMED  HCG, SERUM, QUALITATIVE  GC/CHLAMYDIA PROBE AMP ([place]) NOT AT [facility]    EKG None  Radiology No results found.  Procedures Procedures    Medications Ordered in ED Medications - No data to display  ED Course/ Medical Decision Making/ A&P                                  Medical Decision Making Amount and/or Complexity of Data Reviewed Labs: ordered. ECG/medicine tests: ordered and independent interpretation performed.  Risk Prescription drug management.   22 y.o. F here with SI, plan to OD or jump off a bridge.  No attempts made.  Denies HI/AVH.  Also requesting STD testing-- recent gonorrhea and trich and wants to make sure this has cleared up.  Afebrile, non-toxic.  Vitals are stable.  Labs as above--no leukocytosis or electrolyte derangement.  Negative Tylenol, salicylate, and ethanol levels.  Wet prep with clue cells, will start on course of flagyl.  Gc/chl pending.  Medically cleared, will get TTS consult.  Day team to follow-up on TTS recommendations.  If she is discharged, she will need script for flagyl for BV.  Final Clinical Impression(s) / ED Diagnoses Final diagnoses:  Suicidal ideation    Rx / DC Orders ED Discharge Orders     None         Garlon Hatchet, PA-C 03/29/23 0608    Melene Plan, DO 03/29/23 [phone]

## 2023-03-29 NOTE — Consult Note (Signed)
BH ED ASSESSMENT   Reason for Consult:  Psych Consult  Referring Physician:   Garlon Hatchet, PA-C   Patient Identification: Melissa [name] MRN:  [id number] ED Chief Complaint: Severe recurrent major depression with psychotic features [facility])  Diagnosis:  Principal Problem:   Severe recurrent major depression with psychotic features ([name]) Active Problems:   Homelessness   ED Assessment Time Calculation: Start Time: 0800 Stop Time: 0850 Total Time in Minutes (Assessment Completion): 50   Subjective:    Melissa [name] is a 22 y.o. 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 Patient currently voluntary and medically cleared.  HPI:    Patient seen today for face-to-face psychiatric evaluation.  Upon evaluation, patient tells me that because of psychosocial stressors in the form of losing custody of her children soon at the end of the month and now officially homeless again, she has developed a recrudescence of active suicidal ideations with a plan and intent to either overdose on her prescription medications and/or jump off of a nearby bridge.  Patient states that she does not want to do this, but all of her depressive symptoms have been overwhelming, tells me that she has been feeling hopeless, sleeping poorly, eating poorly, having growing suicidal ideations that are now active, hearing voices that are congruent to her mood, more irritable and agitated, and having psychomotor slowing.    Patient endorses that she is currently a patient of the [facility] behavioral health  outpatient system, takes the prescribed medications of escitalopram, hydroxyzine, BuSpar, and Risperdal that typically works well, but that she has not taken these for about 2 weeks, states that she has not been able to spend the money to obtain these medications. Patient endorses that she additionally lost her job at [employer] recently due to instability in her mood, which triggered her friend she has been living with to give her notice and ultimately kicked her out and be homeless. She reports that she was staying with her friend since about June up until yesterday, before this reports that she was staying at the [facility].   Patient states that her mother and father are supportive, but she cannot live with them, due to having to not be in the same housing and living arrangements as her children (children stay with her parents). Patient orientation is intact upon evaluation.  Patient endorses currently hearing faint voices that are congruent to her mood and negative in nature (though not command in nature). Patient endorses active suicidal ideation with the aforementioned plan and intent.  Patient denies homicidal ideations.  Patient denies paranoia.  Patient endorses overwhelming anxiety lately about a variety of psychosocial stressors.  Patient endorses no EtOH use and/or drug use, but does endorse daily tobacco use, about 5 cigarettes/day.  Patient endorses a desire to obtain inpatient hospitalization, help with housing instability, and get back on her medications that she states have been helpful previously.  Patient endorses last suicide attempt was 2016 when she took seizure medications in an attempt to end her life.  Patient denies past or current history of self injures behavior.  Past Psychiatric History: B[facility] x 2, GAD,  MDD w/ psychotic features, PPD, OCD  Risk to Self or Others: Is the patient at risk to self? Yes Has the patient been a risk to self in the past 6 months? Yes Has the patient been a risk  to self within the distant past? Yes Is the patient a risk to others? No Has the patient been a risk to others in the past 6 months? No Has the patient been a risk to others within the distant past? No  [place] Scale:  Flowsheet Row ED from 03/29/2023 in [facility] Emergency Department at [facility] Clinical Support from 03/14/2023 in [facility] ED from 02/18/2023 in [facility] Ur[facility] Care at [facility] RISK CATEGORY Moderate Risk Low Risk No Risk       Substance Abuse:  Tobacco  Past Medical History:  Past Medical History:  Diagnosis Date   Anemia    Anxiety    Asthma    inhaler. 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   Seizures ([name])    last one in 2015 - on meds   Sickle cell trait ([name])    TBI (traumatic brain injury) ([name]) 2006    Past Surgical History:  Procedure Laterality Date   APPENDECTOMY     CESAREAN SECTION N/A 08/31/2021   Procedure: CESAREAN SECTION;  Surgeon: Kathrynn Running, MD;  Location: [facility] LD ORS;  Service: Obstetrics;  Laterality: N/A;   CESAREAN SECTION N/A 06/05/2022   Procedure: CESAREAN SECTION;  Surgeon: Hermina Staggers, MD;  Location: [facility] LD ORS;  Service: Obstetrics;  Laterality: N/A;   HERNIA REPAIR     INTESTINAL MALROTATION REPAIR  2001   Family History:  Family History  Problem Relation Age of Onset   Depression Mother    Stroke Mother    Obesity Mother    Post-traumatic  stress disorder Mother    Anxiety disorder Mother    Hypertension Mother    Diabetes Mother    Schizophrenia Father    Kidney disease Father    Family Psychiatric  History: None endorsed Social History:  Social History   Substance and Sexual Activity  Alcohol Use Not Currently   Comment: occasionally, prior to pregnancy     Social History   Substance and Sexual Activity  Drug Use Not Currently   Types: Marijuana   Comment: last 2020    Social History   Socioeconomic History   Marital status: Single    Spouse name: Not on file   Number of children: Not on file   Years of education: Not on file   Highest education level: Not on file  Occupational History   Not on file  Tobacco Use   Smoking status: Every Day    Current packs/day: 0.25    Types: Cigarettes   Smokeless tobacco: Never   Tobacco comments:    black and milds, 2 cigs daily  Vaping Use   Vaping status: Former  Substance and  Sexual Activity   Alcohol use: Not Currently    Comment: occasionally, prior to pregnancy   Drug use: Not Currently    Types: Marijuana    Comment: last 2020   Sexual activity: Not Currently    Partners: Male    Birth control/protection: None  Other Topics Concern   Not on file  Social History Narrative   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   She attended [employer].    She lives with her parents, siblings, and grandfather.    She enjoys writing, singing, dancing, cooking, and shopping.   She attends [employer].   Social Determinants of Health   Financial Resource Strain: Not on file  Food Insecurity: Food Insecurity Present (11/14/2022)   Hunger Vital Sign    Worried About Running Out of Food in the Last Year: Sometimes true    Ran Out of Food in the Last Year: Sometimes true  Transportation Needs: No Transportation Needs (11/14/2022)   PRAPARE - Administrator, Civil Service (Medical): No    Lack of Transportation (Non-Medical): No  Recent Concern:  Transportation Needs - Unmet Transportation Needs (11/14/2022)   PRAPARE - Transportation    Lack of Transportation (Medical): Yes    Lack of Transportation (Non-Medical): Yes  Physical Activity: Not on file  Stress: Stress Concern Present (08/08/2022)   Received from [facility], [facility] of Occupational Health - Occupational Stress Questionnaire    Feeling of Stress : To some extent  Social Connections: Unknown (01/03/2022)   Received from [facility]   Social Network    Social Network: Not on file   Additional Social History:    Allergies:   Allergies  Allergen Reactions   Benadryl [Diphenhydramine Hcl] Other (See Comments)    Causes seizures    Haldol [Haloperidol] Anaphylaxis    Tongue swelling.   Latex Swelling    Labial edema after usage of latex condoms.    Shellfish Allergy Anaphylaxis and Other (See Comments)    Patient states her throat gets tight   Zithromax [Azithromycin] Anaphylaxis and Swelling   Camphor Other (See Comments)    unknown   Gluten Meal Other (See Comments)    GI Intolerance   Lactose Intolerance (Gi) Diarrhea    Labs:  Results for orders placed or performed during the hospital encounter of 03/29/23 (from the past 48 hour(s))  Rapid urine drug screen (hospital performed)     Status: None   Collection Time: 03/29/23  4:24 AM  Result Value Ref Range   Opiates NONE DETECTED NONE DETECTED   Cocaine NONE DETECTED NONE DETECTED   Benzodiazepines NONE DETECTED NONE DETECTED   Amphetamines NONE DETECTED NONE DETECTED   Tetrahydrocannabinol NONE DETECTED NONE DETECTED   Barbiturates NONE DETECTED NONE DETECTED    Comment: (NOTE) DRUG SCREEN FOR MEDICAL PURPOSES ONLY.  IF CONFIRMATION IS NEEDED FOR ANY PURPOSE, NOTIFY LAB WITHIN 5 DAYS.  LOWEST DETECTABLE LIMITS FOR URINE DRUG SCREEN Drug Class                     Cutoff (ng/mL) Amphetamine and metabolites    1000 Barbiturate and metabolites     200 Benzodiazepine                 200 Opiates and metabolites        300 Cocaine and metabolites        300 THC  50 Performed at [facility] Lab, [address] [place], [place] [address]   Comprehensive metabolic panel     Status: Abnormal   Collection Time: 03/29/23  4:30 AM  Result Value Ref Range   Sodium 140 135 - 145 mmol/L   Potassium 3.3 (L) 3.5 - 5.1 mmol/L   Chloride 105 98 - 111 mmol/L   CO2 23 22 - 32 mmol/L   Glucose, Bld 85 70 - 99 mg/dL    Comment: Glucose reference range applies only to samples taken after fasting for at least 8 hours.   BUN <5 (L) 6 - 20 mg/dL   Creatinine, Ser 8.29 0.44 - 1.00 mg/dL   Calcium 9.5 8.9 - 56.2 mg/dL   Total Protein 7.3 6.5 - 8.1 g/dL   Albumin 3.8 3.5 - 5.0 g/dL   AST 18 15 - 41 U/L   ALT 16 0 - 44 U/L   Alkaline Phosphatase 95 38 - 126 U/L   Total Bilirubin 0.2 (L) 0.3 - 1.2 mg/dL   GFR, Estimated >13 >08 mL/min    Comment: (NOTE) Calculated using the CKD-EPI Creatinine Equation (2021)    Anion gap 12 5 - 15    Comment: Performed at [facility] Lab, [address], [place], [place] [address]  Ethanol     Status: None   Collection Time: 03/29/23  4:30 AM  Result Value Ref Range   Alcohol, Ethyl (B) <10 <10 mg/dL    Comment: (NOTE) Lowest detectable limit for serum alcohol is 10 mg/dL.  For medical purposes only. Performed at [facility] Lab, [address], [place], [place] [address]   Salicylate level     Status: Abnormal   Collection Time: 03/29/23  4:30 AM  Result Value Ref Range   Salicylate Lvl <7.0 (L) 7.0 - 30.0 mg/dL    Comment: Performed at [facility] Lab, [address] [place], [place] [address]  Acetaminophen level     Status: Abnormal   Collection Time: 03/29/23  4:30 AM  Result Value Ref Range   Acetaminophen (Tylenol), Serum <10 (L) 10 - 30 ug/mL    Comment: (NOTE) Therapeutic concentrations vary significantly. A range of 10-30 ug/mL  may be an effective  concentration for many patients. However, some  are best treated at concentrations outside of this range. Acetaminophen concentrations >150 ug/mL at 4 hours after ingestion  and >50 ug/mL at 12 hours after ingestion are often associated with  toxic reactions.  Performed at [facility] Lab, [address], [place], [place] [address]   hCG, serum, qualitative     Status: None   Collection Time: 03/29/23  4:30 AM  Result Value Ref Range   Preg, Serum NEGATIVE NEGATIVE    Comment:        THE SENSITIVITY OF THIS METHODOLOGY IS >10 mIU/mL. Performed at [facility] Lab, [address] [place], [place] [address]   CBC     Status: Abnormal   Collection Time: 03/29/23  4:31 AM  Result Value Ref Range   WBC 8.9 4.0 - 10.5 K/uL   RBC 4.94 3.87 - 5.11 MIL/uL   Hemoglobin 13.0 12.0 - 15.0 g/dL   HCT 53.6 64.4 - 03.4 %   MCV 79.1 (L) 80.0 - 100.0 fL   MCH 26.3 26.0 - 34.0 pg   MCHC 33.2 30.0 - 36.0 g/dL   RDW 74.2 59.5 - 63.8 %   Platelets 266 150 - 400 K/uL  nRBC 0.0 0.0 - 0.2 %    Comment: Performed at [facility] Lab, [address], [place], [place] [address]  Wet prep, genital     Status: Abnormal   Collection Time: 03/29/23  5:04 AM  Result Value Ref Range   Yeast Wet Prep HPF POC NONE SEEN NONE SEEN   Trich, Wet Prep NONE SEEN NONE SEEN   Clue Cells Wet Prep HPF POC PRESENT (A) NONE SEEN   WBC, Wet Prep HPF POC <10 <10   Sperm NONE SEEN     Comment: Performed at [facility] Lab, [address] [place], [place] [address]    Current Facility-Administered Medications  Medication Dose Route Frequency Provider Last Rate Last Admin   metroNIDAZOLE (FLAGYL) tablet 500 mg  500 mg Oral Q12H Garlon Hatchet, PA-C       Current Outpatient Medications  Medication Sig Dispense Refill   albuterol (VENTOLIN HFA) 108 (90 Base) MCG/ACT inhaler Inhale 1 puff into the lungs 4 (four) times daily as needed for wheezing or shortness of breath.     benzonatate (TESSALON) 100 MG  capsule Take 1 capsule (100 mg total) by mouth every 8 (eight) hours. 21 capsule 0   busPIRone (BUSPAR) 7.5 MG tablet Take 1 tablet (7.5 mg total) by mouth 3 (three) times daily. 90 tablet 1   cetirizine (ZYRTEC ALLERGY) 10 MG tablet Take 1 tablet (10 mg total) by mouth daily for 10 days. 10 tablet 0   escitalopram (LEXAPRO) 5 MG tablet Take 1 tablet (5 mg total) by mouth daily. 30 tablet 1   ferrous sulfate 325 (65 FE) MG tablet Take 1 tablet (325 mg total) by mouth at bedtime. (Patient taking differently: Take 325 mg by mouth every other day.) 30 tablet 3   hydrOXYzine (ATARAX) 50 MG tablet Take 1 tablet (50 mg total) by mouth 3 (three) times daily as needed for anxiety. 75 tablet 1   medroxyPROGESTERone Acetate 150 MG/ML SUSY Inject 150 mg into the muscle every 3 (three) months.     risperiDONE (RISPERDAL) 0.5 MG tablet Take 1 tablet (0.5 mg total) by mouth at bedtime. 30 tablet 1   valACYclovir (VALTREX) 1000 MG tablet Take 1 tablet (1,000 mg total) by mouth 2 (two) times daily. 20 tablet 5    Musculoskeletal: Strength & Muscle Tone: within normal limits Gait & Station: normal Patient leans: N/A   Psychiatric Specialty Exam: Presentation  General Appearance:  Appropriate for Environment  Eye Contact: Fair  Speech: Clear and Coherent  Speech Volume: Normal  Handedness: Right   Mood and Affect  Mood: Depressed; Anxious  Affect: Depressed   Thought Process  Thought Processes: Goal Directed; Linear; Coherent  Descriptions of Associations:Intact  Orientation:Full (Time, Place and Person)  Thought Content:Logical  History of Schizophrenia/Schizoaffective disorder:No data recorded Duration of Psychotic Symptoms:No data recorded Hallucinations:Hallucinations: Auditory Description of Auditory Hallucinations: Reports hearing faint voices at times that are congruent to mood  Ideas of Reference:None  Suicidal Thoughts:Suicidal Thoughts: Yes, Active SI Active  Intent and/or Plan: With Intent; With Plan; With Means to Carry Out; With Access to Means  Homicidal Thoughts:Homicidal Thoughts: No   Sensorium  Memory: Immediate Fair; Recent Fair; Remote Fair  Judgment: Poor  Insight: Poor   Executive Functions  Concentration: Fair  Attention Span: Fair  Recall: [employer] of Knowledge: Fair  Language: Fair   Psychomotor Activity  Psychomotor Activity: Psychomotor Activity: Normal   Assets  Assets: Desire for Improvement; Communication Skills; Physical  Health; Resilience; Talents/Skills; Vocational/Educational    Sleep  Sleep: Sleep: Poor   Physical Exam: Physical Exam Vitals and nursing note reviewed.  Constitutional:      General: She is not in acute distress.    Appearance: She is obese. She is not ill-appearing, toxic-appearing or diaphoretic.  Pulmonary:     Effort: Pulmonary effort is normal.  Neurological:     Mental Status: She is alert and oriented to person, place, and time.  Psychiatric:        Attention and Perception: Attention normal. She is attentive. She perceives auditory (Hears voices) hallucinations. She does not perceive visual hallucinations.        Mood and Affect: Mood is anxious and depressed.        Speech: Speech normal.        Behavior: Behavior is not agitated, slowed, aggressive, withdrawn, hyperactive or combative. Behavior is cooperative.        Thought Content: Thought content is not paranoid or delusional. Thought content includes suicidal (Reports active suicidal ideations) ideation. Thought content does not include homicidal ideation. Thought content includes suicidal plan. Thought content does not include homicidal plan.        Cognition and Memory: Cognition and memory normal.        Judgment: Judgment is impulsive.    Review of Systems  Psychiatric/Behavioral:  Positive for depression, hallucinations (AH) and suicidal ideas (Active). Negative for substance abuse. The  patient is nervous/anxious and has insomnia.   All other systems reviewed and are negative.  Blood pressure 115/77, pulse 87, temperature 98.4 F (36.9 C), temperature source Oral, resp. rate 18, height 5\' 7"  (1.702 m), weight 108 kg, SpO2 100%, unknown if currently breastfeeding. Body mass index is 37.28 kg/m.  Medical Decision Making:  Patient presents this encounter with multiple psychosocial stressors that have caused a recrudescence of active suicidal ideations with a plan and intent to end her life by jumping off a bridge and/or overdosing on medications that are prescribed to her.  Patient endorses a variety of significant depressive symptomology, all of which are consistent with the patient's current historical diagnosis of unipolar major depression with psychotic features.  Patient endorses that pharmacological regimen she has been on currently and previously has been helpful, discussed restarting this today, to which patient endorses she was amenable to this.  Discussed with patient voluntary admission to mental health hospital, but if she should choose to attempt to leave, she would be involuntarily committed.  CSW team currently working on having patient placed for inpatient hospitalization as we speak.  Psychiatry will follow the patient until disposition is obtained.  #Severe recurrent major depression with psychotic features ([name]) #Homelessness  Recommendations  -Recommend restart psychiatric medications -Recommend inpatient hospitalization for mental health -Recommend involuntary commitment, if patient should choose to leave   Disposition: Recommend psychiatric Inpatient admission when medically cleared.  Lenox Ponds, NP 03/29/2023 8:51 AM

## 2023-03-29 NOTE — Progress Notes (Signed)
Pt has been accepted to [facility] TODAY 03/29/2023, pending EKG and signed voluntary consent faxed to [phone]. Bed assignment: 305-2  Pt meets inpatient criteria per Arsenio Loader, NP  Attending Physician will be Phineas Inches, MD  Report can be called to: - Adult unit: [phone]  Pt can arrive after pending items are received  Care Team Notified: [facility] Malva Limes, RN, Thornton Dales, RN, Erick Alley, RN, Arsenio Loader, NP, and Kem Boroughs, Paramedic  Landa, LCSW  03/29/2023 10:22 AM

## 2023-03-29 NOTE — ED Notes (Signed)
Pt ambulated to safe transport vehicle w/ steady gait. All belongings and paperwork given to safe transport. VSS upon departure.

## 2023-03-29 NOTE — Progress Notes (Signed)
Pt admitted today. Pt presents tearful with sad affect. Pt seeking help for suicidal thoughts. Pt reports she was last at [facility] in April with postpartum depression and thoughts to harm her baby. Pt has 3 children who have not been in her care since that time. Pts children remain in the care of her mother. Pt reports recently learning that she will lose custody permanently on 04/16/23. Pt denies HI or thoughts to harm her children. Pt does endorse auditory hallucinations. Pt reports she has been medication compliant but did not seek therapy after last discharge. Pt states she has struggled with housing. Pt was living with her grandmother but was kicked out, is currently living with a friend and their parents but is in fear of being kicked out. Pt states she may have to go to a shelter after discharge. Q 15 minute checks initiated.

## 2023-03-29 NOTE — ED Provider Notes (Signed)
I was asked to round on this patient has been accepted to a bed at behavioral health. Patient was sleeping but aroused easily.  She states she is having no complaints physically at this time.  She is well-appearing and appears safe for transport.   Arthor Captain, PA-C 03/29/23 1435    Margarita Grizzle, MD 04/01/23 [phone]

## 2023-03-29 NOTE — ED Notes (Signed)
Sitter is at bedside.  °

## 2023-03-29 NOTE — ED Triage Notes (Addendum)
Pt arrives POV with complaints of suicidal thoughts x 1 week. Pt states she feels like she wants to jump off of a bridge because she is going to lose custody of her kids. Pt states she has not had any of her medications in 2 weeks. Also requesting an STD test because of her "stomach cramping".

## 2023-03-30 ENCOUNTER — Encounter (HOSPITAL_COMMUNITY): Payer: Self-pay

## 2023-03-30 DIAGNOSIS — F332 Major depressive disorder, recurrent severe without psychotic features: Secondary | ICD-10-CM | POA: Diagnosis not present

## 2023-03-30 MED ORDER — POTASSIUM CHLORIDE CRYS ER 20 MEQ PO TBCR
40.0000 meq | EXTENDED_RELEASE_TABLET | Freq: Once | ORAL | Status: AC
  Administered 2023-03-30: 40 meq via ORAL
  Filled 2023-03-30: qty 2

## 2023-03-30 MED ORDER — BUSPIRONE HCL 10 MG PO TABS
10.0000 mg | ORAL_TABLET | Freq: Three times a day (TID) | ORAL | Status: DC
  Administered 2023-03-30 – 2023-04-03 (×11): 10 mg via ORAL
  Filled 2023-03-30 (×4): qty 1
  Filled 2023-03-30: qty 11
  Filled 2023-03-30: qty 1
  Filled 2023-03-30: qty 11
  Filled 2023-03-30 (×3): qty 1
  Filled 2023-03-30: qty 11
  Filled 2023-03-30 (×2): qty 1
  Filled 2023-03-30: qty 2
  Filled 2023-03-30 (×4): qty 1

## 2023-03-30 MED ORDER — METRONIDAZOLE 500 MG PO TABS
500.0000 mg | ORAL_TABLET | Freq: Two times a day (BID) | ORAL | Status: DC
  Administered 2023-03-30 – 2023-04-03 (×9): 500 mg via ORAL
  Filled 2023-03-30 (×4): qty 1
  Filled 2023-03-30: qty 5
  Filled 2023-03-30 (×7): qty 1
  Filled 2023-03-30: qty 2
  Filled 2023-03-30: qty 5

## 2023-03-30 MED ORDER — ESCITALOPRAM OXALATE 10 MG PO TABS
10.0000 mg | ORAL_TABLET | Freq: Every day | ORAL | Status: DC
  Administered 2023-03-31: 10 mg via ORAL
  Filled 2023-03-30 (×3): qty 1

## 2023-03-30 NOTE — Plan of Care (Signed)
  Problem: Education: Goal: Knowledge of [facility] Education information/materials will improve Outcome: Progressing Goal: Emotional status will improve Outcome: Progressing Goal: Mental status will improve Outcome: Progressing Goal: Verbalization of understanding the information provided will improve Outcome: Progressing   

## 2023-03-30 NOTE — Progress Notes (Signed)
This nurse and another RN attempted to get patient up x2 to get her to take her medications. Pt responded ok but did not come to get medications.

## 2023-03-30 NOTE — Group Note (Signed)
Recreation Therapy Group Note   Group Topic:Problem Solving  Group Date: 03/30/2023 Start Time: 0931 End Time: 1001 Facilitators:  -McCall, LRT,CTRS Location: [address] Day[address]   Goal Area(s) Addresses:  Patient will effectively work with peer towards shared goal.  Patient will identify skills used to make activity successful.  Patient will identify how skills used during activity can be applied to reach post d/c goals.   Group Description: [employer]. In teams of 5-6, patients were given 11 craft pipe cleaners. Using the materials provided, patients were instructed to compete again the opposing team(s) to build the tallest free-standing structure from floor level. The activity was timed; difficulty increased by Clinical research associate as Production designer, theatre/television/film continued.  Systematically resources were removed with additional directions for example, placing one arm behind their back, working in silence, and shape stipulations. LRT facilitated post-activity discussion reviewing team processes and necessary communication skills involved in completion. Patients were encouraged to reflect how the skills utilized, or not utilized, in this activity can be incorporated to positively impact support systems post discharge.   Affect/Mood: N/A   Participation Level: Did not attend    Clinical Observations/Individualized Feedback:     Plan: Continue to engage patient in RT group sessions 2-3x/week.    -McCall, LRT,CTRS  03/30/2023 12:10 PM

## 2023-03-30 NOTE — BHH Suicide Risk Assessment (Signed)
[facility] Admission Suicide Risk Assessment   Nursing information obtained from:  Patient Demographic factors:  Adolescent or young adult Current Mental Status:  Suicidal ideation indicated by patient Loss Factors:  Loss of significant relationship Historical Factors:  Prior suicide attempts Risk Reduction Factors:  Sense of responsibility to family  Total Time spent with patient: 1 hour Principal Problem: MDD (major depressive disorder), recurrent severe, without psychosis ([name]) Diagnosis:  Principal Problem:   MDD (major depressive disorder), recurrent severe, without psychosis ([name]) Active Problems:   GAD (generalized anxiety disorder)  Subjective Data: See H&P  Continued Clinical Symptoms:  Alcohol Use Disorder Identification Test Final Score (AUDIT): 0 The "Alcohol Use Disorders Identification Test", Guidelines for Use in Primary Care, Second Edition.  World Science writer [facility]). Score between 0-7:  no or low risk or alcohol related problems. Score between 8-15:  moderate risk of alcohol related problems. Score between 16-19:  high risk of alcohol related problems. Score 20 or above:  warrants further diagnostic evaluation for alcohol dependence and treatment.   CLINICAL FACTORS:   Depression:   Impulsivity   Musculoskeletal: Strength & Muscle Tone: within normal limits Gait & Station: normal Patient leans: N/A  Psychiatric Specialty Exam:  Presentation  General Appearance:  Appropriate for Environment  Eye Contact: Fair  Speech: Clear and Coherent  Speech Volume: Normal  Handedness: Right   Mood and Affect  Mood: Depressed; Anxious  Affect: Depressed   Thought Process  Thought Processes: Goal Directed; Linear; Coherent  Descriptions of Associations:Intact  Orientation:Full (Time, Place and Person)  Thought Content:Logical  History of Schizophrenia/Schizoaffective disorder:No data recorded Duration of Psychotic Symptoms:No data  recorded Hallucinations:Hallucinations: Auditory Description of Auditory Hallucinations: Reports hearing faint voices at times that are congruent to mood  Ideas of Reference:None  Suicidal Thoughts:Suicidal Thoughts: Yes, Active SI Active Intent and/or Plan: With Intent; With Plan; With Means to Carry Out; With Access to Means  Homicidal Thoughts:Homicidal Thoughts: No   Sensorium  Memory: Immediate Fair; Recent Fair; Remote Fair  Judgment: Poor  Insight: Poor   Executive Functions  Concentration: Fair  Attention Span: Fair  Recall: [employer] of Knowledge: Fair  Language: Fair   Psychomotor Activity  Psychomotor Activity: Psychomotor Activity: Normal   Assets  Assets: Desire for Improvement; Communication Skills; Physical Health; Resilience; Talents/Skills; Vocational/Educational   Sleep  Sleep: Sleep: Poor    Physical Exam: Physical Exam ROS Blood pressure 110/77, pulse 70, temperature 97.9 F (36.6 C), temperature source Oral, resp. rate 16, height 5\' 7"  (1.702 m), weight 108 kg, SpO2 100%, unknown if currently breastfeeding. Body mass index is 37.29 kg/m.   COGNITIVE FEATURES THAT CONTRIBUTE TO RISK:  Closed-mindedness    SUICIDE RISK:   Moderate:  Frequent suicidal ideation with limited intensity, and duration, some specificity in terms of plans, no associated intent, good self-control, limited dysphoria/symptomatology, some risk factors present, and identifiable protective factors, including available and accessible social support.  PLAN OF CARE: See H&P  I certify that inpatient services furnished can reasonably be expected to improve the patient's condition.    Abbott Pao, MD 03/30/2023, 1:48 PM

## 2023-03-30 NOTE — Progress Notes (Signed)
   03/30/23 1000  Psych Admission Type (Psych Patients Only)  Admission Status Voluntary  Psychosocial Assessment  Patient Complaints Anxiety  Eye Contact Fair  Facial Expression Sad  Affect Anxious;Depressed  Speech Logical/coherent  Interaction Assertive  Motor Activity Other (Comment) (WDL)  Appearance/Hygiene Unremarkable  Behavior Characteristics Appropriate to situation  Mood Anxious  Thought Process  Coherency WDL  Content WDL  Delusions None reported or observed  Perception WDL  Hallucination None reported or observed  Judgment Poor  Confusion None  Danger to Self  Current suicidal ideation? Denies  Agreement Not to Harm Self Yes  Description of Agreement verbal   Patient calm and cooperative this shift but she does have to be prompted several times to come to the medication window to get medications. Denies SI/HI/AVH today. Denies pain. Contracts for safety on unit. Q79min checks remain in place.

## 2023-03-30 NOTE — BH IP Treatment Plan (Signed)
Interdisciplinary Treatment and Diagnostic Plan Update  03/30/2023 Time of Session: 10:50 AM  SHAQUIRA GAFF MRN: [id number]  Principal Diagnosis: MDD (major depressive disorder), recurrent, severe, with psychosis ([name])  Secondary Diagnoses: Principal Problem:   MDD (major depressive disorder), recurrent, severe, with psychosis ([name])   Current Medications:  Current Facility-Administered Medications  Medication Dose Route Frequency Provider Last Rate Last Admin   acetaminophen (TYLENOL) tablet 650 mg  650 mg Oral Q6H PRN Lenox Ponds, NP   650 mg at 03/29/23 1732   alum & mag hydroxide-simeth (MAALOX/MYLANTA) 200-200-20 MG/5ML suspension 30 mL  30 mL Oral Q4H PRN Lenox Ponds, NP       busPIRone (BUSPAR) tablet 7.5 mg  7.5 mg Oral TID Lenox Ponds, NP   7.5 mg at 03/30/23 1128   escitalopram (LEXAPRO) tablet 5 mg  5 mg Oral Daily Lenox Ponds, NP   5 mg at 03/30/23 1128   hydrOXYzine (ATARAX) tablet 50 mg  50 mg Oral TID PRN Lenox Ponds, NP   50 mg at 03/29/23 2141   magnesium hydroxide (MILK OF MAGNESIA) suspension 30 mL  30 mL Oral Daily PRN Lenox Ponds, NP       metroNIDAZOLE (FLAGYL) tablet 500 mg  500 mg Oral Q12H Ravi, Himabindu, MD   500 mg at 03/30/23 1128   nicotine (NICODERM CQ - dosed in mg/24 hours) patch 14 mg  14 mg Transdermal Q0600 Lenox Ponds, NP       risperiDONE (RISPERDAL) tablet 0.5 mg  0.5 mg Oral QHS Lenox Ponds, NP   0.5 mg at 03/29/23 2141   traZODone (DESYREL) tablet 50 mg  50 mg Oral QHS PRN Lenox Ponds, NP       ziprasidone (GEODON) injection 20 mg  20 mg Intramuscular Daily PRN Lenox Ponds, NP       PTA Medications: Medications Prior to Admission  Medication Sig Dispense Refill Last Dose   albuterol (VENTOLIN HFA) 108 (90 Base) MCG/ACT inhaler Inhale 1 puff into the lungs 4 (four) times daily as needed for wheezing or shortness of breath.      benzonatate (TESSALON) 100 MG capsule Take 1 capsule (100 mg  total) by mouth every 8 (eight) hours. 21 capsule 0    busPIRone (BUSPAR) 7.5 MG tablet Take 1 tablet (7.5 mg total) by mouth 3 (three) times daily. 90 tablet 1    doxycycline (VIBRAMYCIN) 100 MG capsule Take 100 mg by mouth 2 (two) times daily.      escitalopram (LEXAPRO) 5 MG tablet Take 1 tablet (5 mg total) by mouth daily. 30 tablet 1    ferrous sulfate 325 (65 FE) MG tablet Take 1 tablet (325 mg total) by mouth at bedtime. (Patient taking differently: Take 325 mg by mouth every other day.) 30 tablet 3    hydrOXYzine (ATARAX) 50 MG tablet Take 1 tablet (50 mg total) by mouth 3 (three) times daily as needed for anxiety. 75 tablet 1    medroxyPROGESTERone Acetate 150 MG/ML SUSY Inject 150 mg into the muscle every 3 (three) months.      metroNIDAZOLE (FLAGYL) 500 MG tablet Take 500 mg by mouth 2 (two) times daily.      risperiDONE (RISPERDAL) 0.5 MG tablet Take 1 tablet (0.5 mg total) by mouth at bedtime. 30 tablet 1    valACYclovir (VALTREX) 1000 MG tablet Take 1 tablet (1,000 mg total) by mouth 2 (two) times daily. 20 tablet 5  Patient Stressors:    Patient Strengths:    Treatment Modalities: Medication Management, Group therapy, Case management,  1 to 1 session with clinician, Psychoeducation, Recreational therapy.   Physician Treatment Plan for Primary Diagnosis: MDD (major depressive disorder), recurrent, severe, with psychosis ([name]) Long Term Goal(s):     Short Term Goals:    Medication Management: Evaluate patient's response, side effects, and tolerance of medication regimen.  Therapeutic Interventions: 1 to 1 sessions, Unit Group sessions and Medication administration.  Evaluation of Outcomes: Not Progressing  Physician Treatment Plan for Secondary Diagnosis: Principal Problem:   MDD (major depressive disorder), recurrent, severe, with psychosis ([name])  Long Term Goal(s):     Short Term Goals:       Medication Management: Evaluate patient's response, side effects, and  tolerance of medication regimen.  Therapeutic Interventions: 1 to 1 sessions, Unit Group sessions and Medication administration.  Evaluation of Outcomes: Not Progressing   RN Treatment Plan for Primary Diagnosis: MDD (major depressive disorder), recurrent, severe, with psychosis ([name]) Long Term Goal(s): Knowledge of disease and therapeutic regimen to maintain health will improve  Short Term Goals: Ability to remain free from injury will improve, Ability to verbalize frustration and anger appropriately will improve, Ability to demonstrate self-control, Ability to participate in decision making will improve, Ability to verbalize feelings will improve, Ability to disclose and discuss suicidal ideas, Ability to identify and develop effective coping behaviors will improve, and Compliance with prescribed medications will improve  Medication Management: RN will administer medications as ordered by provider, will assess and evaluate patient's response and provide education to patient for prescribed medication. RN will report any adverse and/or side effects to prescribing provider.  Therapeutic Interventions: 1 on 1 counseling sessions, Psychoeducation, Medication administration, Evaluate responses to treatment, Monitor vital signs and CBGs as ordered, Perform/monitor CIWA, COWS, AIMS and Fall Risk screenings as ordered, Perform wound care treatments as ordered.  Evaluation of Outcomes: Not Progressing   LCSW Treatment Plan for Primary Diagnosis: MDD (major depressive disorder), recurrent, severe, with psychosis ([name]) Long Term Goal(s): Safe transition to appropriate next level of care at discharge, Engage patient in therapeutic group addressing interpersonal concerns.  Short Term Goals: Engage patient in aftercare planning with referrals and resources, Increase social support, Increase ability to appropriately verbalize feelings, Increase emotional regulation, Facilitate acceptance of mental health  diagnosis and concerns, Facilitate patient progression through stages of change regarding substance use diagnoses and concerns, Identify triggers associated with mental health/substance abuse issues, and Increase skills for wellness and recovery  Therapeutic Interventions: Assess for all discharge needs, 1 to 1 time with Social worker, Explore available resources and support systems, Assess for adequacy in community support network, Educate family and significant other(s) on suicide prevention, Complete Psychosocial Assessment, Interpersonal group therapy.  Evaluation of Outcomes: Not Progressing   Progress in Treatment: Attending groups: No. Participating in groups: No. Taking medication as prescribed: Yes. Toleration medication: Yes. Family/Significant other contact made: No, will contact:  Whoever pt gives CSW permission to contact  Patient understands diagnosis: Yes. Discussing patient identified problems/goals with staff: Yes. Medical problems stabilized or resolved: Yes. Denies suicidal/homicidal ideation: Yes. Issues/concerns per patient self-inventory: No.   New problem(s) identified: No, Describe:  None reported   New Short Term/Long Term Goal(s):medication stabilization, elimination of SI thoughts, development of comprehensive mental wellness plan.    Patient Goals:  " Not to have any thoughts to harm myself "  Discharge Plan or Barriers: Patient recently admitted. CSW will continue to follow and  assess for appropriate referrals and possible discharge planning.    Reason for Continuation of Hospitalization: Anxiety Depression Medication stabilization Suicidal ideation  Estimated Length of Stay: 3-5 days   Last 3 [place] Suicide Severity Risk Score: Flowsheet Row [facility]Current[facility] Most recent reading at 03/29/2023  6:14 PM ED from 03/29/2023 in [facility] Most  recent reading at 03/29/2023  4:19 AM Clinical Support from 03/14/2023 in [facility] Most recent reading at 03/14/2023  9:21 AM  C-SSRS RISK CATEGORY Low Risk Moderate Risk Low Risk       Last PHQ 2/9 Scores:    03/14/2023    9:21 AM 01/24/2023    8:32 AM 04/07/2022   10:55 AM  Depression screen PHQ 2/9  Decreased Interest 1 2 1   Down, Depressed, Hopeless 2 1 1   PHQ - 2 Score 3 3 2   Altered sleeping 1 3 0  Tired, decreased energy 3 2 1   Change in appetite 2 3 0  Feeling bad or failure about yourself  1 3 0  Trouble concentrating 1 2 1   Moving slowly or fidgety/restless 1 2 1   Suicidal thoughts 1 1 0  PHQ-9 Score 13 19 5   Difficult doing work/chores Not difficult at all Somewhat difficult     Scribe for Treatment Team: Isabella Bowens, Theresia Majors 03/30/2023 1:09 PM

## 2023-03-30 NOTE — BHH Group Notes (Signed)
Psychoeducational Group Note  Date:  03/30/2023 Time:  2000  Group Topic/Focus:  Alcoholics Anonymous Meeting  Participation Level: Did Not Attend  Participation Quality:  Not Applicable  Affect:  Not Applicable  Cognitive:  Not Applicable  Insight:  Not Applicable  Engagement in Group: Not Applicable  Additional Comments:  Joined group for the last five minutes.   Melissa [name] S 03/30/2023, 10:09 PM

## 2023-03-30 NOTE — Progress Notes (Signed)
   03/30/23 0726  Sleep (Behavioral Health Patients Only)  Calculate sleep? (Click Yes once per 24 hr at 0600 safety check) Yes  Documented sleep last 24 hours 6.25

## 2023-03-30 NOTE — Progress Notes (Signed)
Patient reports she takes Valtrex 1000mg  BID at home but it is not currently on her medication list here. Dr. Abbott Pao made aware.
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
## 2023-03-30 NOTE — H&P (Signed)
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  According to outside records, the patient presented to the emergency room reporting suicidal thoughts for 1 week with desire to jump off a bridge or overdose on prescribed medications  Initial assessment on 8/9, patient was evaluated on the inpatient unit, the patient reports has no recollection of how she ended up in the emergency room but tells me that she has been only drinking alcohol socially but recalls drinking at least 4 shots of rum prior to going to emergency room and had a phone conversation with one of her kids father who reported to her later on that she told him that she is feeling suicidal and she noted to him that she does not want to live anymore she does not recall seeing in the emergency room that she wanted to jump off a bridge, primarily she seems to be minimizing depression symptoms but with further discussion she admits to multiple stressors including possibility of losing custody of her children to her parents related to recent admission in April to this facility and her requiring mental health help also notes a stress related to arguments with her kids fathers and not seeing her children as frequent as she wants secondary to CPS being involved in the case.  She was here to assist facility in April and at that time she reported thoughts to harm herself as well as her children which started the process of CPS getting involved.  With further encouragement she does admit to some increased  depression and anxiety lately secondary to stressors with occasions of interrupted sleep, reports increased appetite but fair energy level and interest, she denies feeling hopeless or helpless but questionably minimizing symptoms she denies passive or active SI and adamantly confirms that last active SI or HI was back in April 2024 with none since then.  She denies AVH or paranoia or other delusions, she denies symptoms consistent with mania hypomania or PTSD.  She tells me that since she was started on psychotropic medications in April prior to her discharge from this facility she has been doing better in regard to her mood but agrees with medication changes as noted below.  With further discussion she does admit to some increased depression and anxiety with self-medicating with drinking alcohol.  PTA psychotropic medications: She reports her most recent medication regimen that has been helpful included Lexapro 5 mg daily, BuSpar 7.5 mg 3 times daily, risperidone 0.5 mg at bedtime and Atarax 50 mg as needed for anxiety.   Collateral information: None at this time but provided her mother as collateral source of information and he provided verbal agreement to talk to her [name] [phone]  Past Psychiatric History:  Prior Psychiatric diagnoses: MDD, GAD Past Psychiatric Hospitalizations: 3 psychiatric hospitalizations as a child, to as an adult in April 2023 in April 2020 for to this facility  History of self mutilation: Denies Past suicide attempts: Reports 1 suicide attempt when attempted to overdose on her seizure medication in 2015 Past history of HI, violent or aggressive behavior: Denies  Past Psychiatric medications trials: Recent medication regimen  including Lexapro risperidone Atarax and BuSpar with good efficacy reported but seems to be minimizing recent symptoms as noted above History of ECT/TMS: Denies  Outpatient psychiatric Follow up: Follows up at urgent care Prior  Outpatient Therapy: Follows up at the urgent care    Is the patient at risk to self? Yes.    Has the patient been a risk to self in the past 6 months? No.  Has the patient been a risk to self within the distant past? No.  Is the patient a risk to others? No.  Has the patient been a risk to others in the past 6 months? No.  Has the patient been a risk to others within the distant past? No.    Substance Use History: Alcohol: Reports drinking alcohol occasionally but as noted under HPI prior to this admission she drank 4 shots but From her description since to be large shots regarding amounts, denies any history of withdrawals or DTs Tobacoo: Smokes half pack of cigarette daily Marijuana: Denies Cocaine: Denies Stimulants: Denies IV drug use: Denies Opiates: Denies Prescribed Meds abuse: Denies H/O withdrawals, blackouts, DTs: Denies History of Detox / Rehab: Denies DUI: Denies  Alcohol Screening: 1. How often do you have a drink containing alcohol?: Never 2. How many drinks containing alcohol do you have on a typical day when you are drinking?: 1 or 2 3. How often do you have six or more drinks on one occasion?: Never AUDIT-C Score: 0 4. How often during the last year have you found that you were not able to stop drinking once you had started?: Never 5. How often during the last year have you failed to do what was normally expected from you because of drinking?: Never 6. How often during the last year have you needed a first drink in the morning to get yourself going after a heavy drinking session?: Never 7. How often during the last year have you had a feeling of guilt of remorse after drinking?: Never 8. How often during the last year have you been unable to remember what happened the night before because you had been drinking?: Never 9. Have you or someone else been injured as a result of your drinking?: No 10. Has a relative or friend or a doctor or another health worker been  concerned about your drinking or suggested you cut down?: No Alcohol Use Disorder Identification Test Final Score (AUDIT): 0 Alcohol Brief Interventions/Follow-up: Alcohol education/Brief advice  Substance Abuse History in the last 12 months:  Yes.     Tobacco Screening:     Past Medical/Surgical History:  Past Medical History:  Diagnosis Date   Anemia    Anxiety    Asthma    inhaler. 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   Seizures ([name])    last one in 2015 - on meds   Sickle cell trait ([name])    TBI (traumatic brain injury) ([name]) 2006    Past Surgical History:  Procedure Laterality Date   APPENDECTOMY     CESAREAN SECTION N/A 08/31/2021   Procedure: CESAREAN SECTION;  Surgeon: Kathrynn Running, MD;  Location: [facility] LD ORS;  Service: Obstetrics;  Laterality: N/A;   CESAREAN SECTION N/A 06/05/2022   Procedure: CESAREAN SECTION;  Surgeon: Hermina Staggers, MD;  Location: [facility] LD ORS;  Service: Obstetrics;  Laterality: N/A;   HERNIA REPAIR     INTESTINAL MALROTATION REPAIR  2001    Family History:  Family History  Problem Relation Age of Onset   Depression Mother    Stroke Mother    Obesity Mother    Post-traumatic stress disorder Mother    Anxiety disorder Mother    Hypertension Mother    Diabetes Mother    Schizophrenia Father    Kidney disease Father     Family Psychiatric History:  Psychiatric illness: Reports grandmother and sister with bipolar  disorder diagnosis Suicide: Denies Substance Abuse: Reports some family members on mother side with alcohol use  Social History:  Social History   Substance and Sexual Activity  Alcohol Use Not Currently   Comment: occasionally, prior to pregnancy     Social History   Substance and Sexual Activity  Drug Use Not Currently   Types: Marijuana   Comment: last 2020    Living situation: Currently lives with a friend for the past 3 months.  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   Latex Swelling    Labial edema after usage of latex condoms.    Shellfish Allergy Anaphylaxis and Other (See Comments)    Patient states her throat gets tight   Zithromax [Azithromycin] Anaphylaxis and Swelling   Camphor Other (See Comments)    unknown   Gluten Meal Other (See Comments)    GI Intolerance   Lactose Intolerance (Gi) Diarrhea    Lab Results:  Results for orders placed or performed during the hospital encounter of 03/29/23 (from the past 48 hour(s))  Hemoglobin A1c     Status: None   Collection Time: 03/30/23  6:55 AM  Result Value Ref Range   Hgb A1c MFr Bld 5.2 4.8 - 5.6 %    Comment: (NOTE) Pre diabetes:          5.7%-6.4%  Diabetes:               >6.4%  Glycemic control for   <7.0% adults with diabetes    Mean Plasma Glucose 102.54 mg/dL    Comment: Performed at [facility], [address], [place], [place] [address]  Lipid panel     Status: Abnormal   Collection Time: 03/30/23  6:55 AM  Result Value Ref Range   Cholesterol 148 0 - 200 mg/dL   Triglycerides 69 <604 mg/dL   HDL 32 (L) >54  mg/dL   Total CHOL/HDL Ratio 4.6 RATIO   VLDL 14 0 - 40 mg/dL   LDL Cholesterol 161 (H) 0 - 99 mg/dL    Comment:        Total Cholesterol/HDL:CHD Risk Coronary Heart Disease Risk Table                     Men   Women  1/2 Average Risk   3.4   3.3  Average Risk       5.0   4.4  2 X Average Risk   9.6   7.1  3 X Average Risk  23.4   11.0        Use the calculated Patient Ratio above and the CHD Risk Table to determine the patient's CHD Risk.        ATP III CLASSIFICATION (LDL):  <100     mg/dL   Optimal  096-045  mg/dL   Near or Above                    Optimal  130-159  mg/dL   Borderline  409-811  mg/dL   High  >914     mg/dL   Very High Performed at [facility], [address], [place], [place] [address]     Blood Alcohol level:  Lab Results  Component Value Date   ETH <10 03/29/2023   ETH <10 11/14/2022    Metabolic Disorder Labs:  Lab Results  Component Value Date   HGBA1C 5.2 03/30/2023   MPG 102.54 03/30/2023   MPG 105 11/14/2022   Lab Results  Component Value Date   PROLACTIN 14.4 11/14/2022   PROLACTIN 8.8 01/01/2017   Lab Results  Component Value Date   CHOL 148 03/30/2023   TRIG 69 03/30/2023   HDL 32 (L) 03/30/2023   CHOLHDL 4.6 03/30/2023   VLDL 14 03/30/2023   LDLCALC 102 (H) 03/30/2023   LDLCALC 101 (H) 11/14/2022    Current Medications: Current Facility-Administered Medications  Medication Dose Route Frequency Provider Last Rate Last Admin   acetaminophen (TYLENOL) tablet 650 mg  650 mg Oral Q6H PRN Lenox Ponds, NP   650 mg at 03/29/23 1732   alum &  mag hydroxide-simeth (MAALOX/MYLANTA) 200-200-20 MG/5ML suspension 30 mL  30 mL Oral Q4H PRN Lenox Ponds, NP       busPIRone (BUSPAR) tablet 7.5 mg  7.5 mg Oral TID Lenox Ponds, NP   7.5 mg at 03/30/23 1128   escitalopram (LEXAPRO) tablet 5 mg  5 mg Oral Daily Lenox Ponds, NP   5 mg at 03/30/23 1128   hydrOXYzine (ATARAX) tablet 50 mg  50 mg Oral TID PRN Lenox Ponds, NP   50 mg at 03/29/23 2141   magnesium hydroxide (MILK OF MAGNESIA) suspension 30 mL  30 mL Oral Daily PRN Lenox Ponds, NP       metroNIDAZOLE (FLAGYL) tablet 500 mg  500 mg Oral Q12H Ravi, Himabindu, MD   500 mg at 03/30/23 1128   nicotine (NICODERM CQ - dosed in mg/24 hours) patch 14 mg  14 mg Transdermal Q0600 Lenox Ponds, NP       risperiDONE (RISPERDAL) tablet 0.5 mg  0.5 mg Oral QHS Lenox Ponds, NP   0.5 mg at 03/29/23 2141   traZODone (DESYREL) tablet 50 mg  50 mg Oral QHS PRN Lenox Ponds, NP       ziprasidone (GEODON)  injection 20 mg  20 mg Intramuscular Daily PRN Lenox Ponds, NP        PTA Medications: Medications Prior to Admission  Medication Sig Dispense Refill Last Dose   albuterol (VENTOLIN HFA) 108 (90 Base) MCG/ACT inhaler Inhale 1 puff into the lungs 4 (four) times daily as needed for wheezing or shortness of breath.      benzonatate (TESSALON) 100 MG capsule Take 1 capsule (100 mg total) by mouth every 8 (eight) hours. 21 capsule 0    busPIRone (BUSPAR) 7.5 MG tablet Take 1 tablet (7.5 mg total) by mouth 3 (three) times daily. 90 tablet 1    doxycycline (VIBRAMYCIN) 100 MG capsule Take 100 mg by mouth 2 (two) times daily.      escitalopram (LEXAPRO) 5 MG tablet Take 1 tablet (5 mg total) by mouth daily. 30 tablet 1    ferrous sulfate 325 (65 FE) MG tablet Take 1 tablet (325 mg total) by mouth at bedtime. (Patient taking differently: Take 325 mg by mouth every other day.) 30 tablet 3    hydrOXYzine (ATARAX) 50 MG tablet Take 1 tablet (50 mg total) by mouth 3 (three)  times daily as needed for anxiety. 75 tablet 1    medroxyPROGESTERone Acetate 150 MG/ML SUSY Inject 150 mg into the muscle every 3 (three) months.      metroNIDAZOLE (FLAGYL) 500 MG tablet Take 500 mg by mouth 2 (two) times daily.      risperiDONE (RISPERDAL) 0.5 MG tablet Take 1 tablet (0.5 mg total) by mouth at bedtime. 30 tablet 1    valACYclovir (VALTREX) 1000 MG tablet Take 1 tablet (1,000 mg total) by mouth 2 (two) times daily. 20 tablet 5     Musculoskeletal: Strength & Muscle Tone: within normal limits Gait & Station: normal Patient leans: N/A   Physical Findings: AIMS: Facial and Oral Movements Muscles of Facial Expression: None, normal Lips and Perioral Area: None, normal Jaw: None, normal Tongue: None, normal,Extremity Movements Upper (arms, wrists, hands, fingers): None, normal Lower (legs, knees, ankles, toes): None, normal, Trunk Movements Neck, shoulders, hips: None, normal, Overall Severity Severity of abnormal movements (highest score from questions above): None, normal Incapacitation due to abnormal movements: None, normal Patient's awareness of abnormal movements (rate only patient's report): No Awareness, Dental Status Current problems with teeth and/or dentures?: No Does patient usually wear dentures?: No  CIWA:    COWS:     Psychiatric Specialty Exam:  General Appearance: Dressed in hospital gown, disheveled, appears older than stated age  Behavior: Cooperative  Psychomotor Activity:No psychomotor agitation but mild retardation noted  Eye Contact: Limited Speech: Decreased amount, decreased tone and volume   Mood: Moderately dysphoric Affect: Restricted send affect  Thought Process: Linear and goal-directed Descriptions of Associations: Intact yet concrete Thought Content: Hallucinations: Denies AH, VH, does not appear responding to stimuli Delusions: No paranoia  Suicidal Thoughts: Denies passive or active SI, intention, plan  Homicidal  Thoughts: Denies HI, intention, plan   Alertness/Orientation: Alert and oriented  Insight: poor Judgment: poor  Memory: Fair  Chartered certified accountant: Fair Attention Span: Fair Recall: [employer] of Knowledge: Fair   Physical Exam:  Physical Exam Vitals and nursing note reviewed.  Constitutional:      Appearance: Normal appearance.  HENT:     Head: Normocephalic and atraumatic.     Nose: Nose normal.  Eyes:     Pupils: Pupils are equal, round, and reactive to light.  Pulmonary:     Effort:  Pulmonary effort is normal.  Musculoskeletal:        General: Normal range of motion.     Cervical back: Normal range of motion.  Neurological:     General: No focal deficit present.     Mental Status: She is alert and oriented to person, place, and time.    Review of Systems  All other systems reviewed and are negative.  Blood pressure 110/77, pulse 70, temperature 97.9 F (36.6 C), temperature source Oral, resp. rate 16, height 5\' 7"  (1.702 m), weight 108 kg, SpO2 100%, unknown if currently breastfeeding. Body mass index is 37.29 kg/m.   Assets  Assets:Desire for Improvement; Communication Skills; Physical Health; Resilience; Talents/Skills; Vocational/Educational    Treatment Plan Summary: Daily contact with patient to assess and evaluate symptoms and progress in treatment  ASSESSMENT:  Principal Diagnosis: MDD (major depressive disorder), recurrent severe, without psychosis ([name]) Diagnosis:  Principal Problem:   MDD (major depressive disorder), recurrent severe, without psychosis ([name]) Active Problems:   GAD (generalized anxiety disorder)   PLAN: Safety and Monitoring:  -- Voluntary admission to inpatient psychiatric unit for safety, stabilization and treatment  -- Daily contact with patient to assess and evaluate symptoms and progress in treatment  -- Patient's case to be discussed in multi-disciplinary team meeting  -- Observation Level : q15 minute  checks  -- Vital signs:  q12 hours  -- Precautions: suicide, elopement, and assault  2. Medications:   Titrate Lexapro from 5 to 10 mg daily for depression and long-term treatment of anxiety, monitor efficacy and safety and adjust accordingly  Titrated BuSpar from 7.5 mg to 10 mg 3 times daily for anxiety  Continue Atarax 50 mg 3 times daily as needed for anxiety  Continue risperidone 0.5 mg at bedtime for mood and depression, home medication  Started trazodone 50 mg at bedtime as needed for sleep   Patient with history of seizure disorder last seizure reported 2015, not on antiepileptic, will continue to follow.   Continue Flagyl 500 mg twice daily for bacterial vaginosis, for 14 doses, started in emergency room  Potassium level in the emergency room low at 3.3, will replenish potassium with one-time dosing and repeat CMP in the morning  The risks/benefits/side-effects/alternatives to this medication were discussed in detail with the patient and time was given for questions. The patient consents to medication trial.    -- Metabolic profile and EKG monitoring obtained while on an atypical antipsychotic (BMI: Lipid Panel: HbgA1c: QTc:)     3. Labs Reviewed: CBC within normal level, CMP within normal limit except for slightly low potassium 3.3, lipid panel within normal level except for low HDL 32 and high LDL 102, pregnancy test negative, hemoglobin A1c 5.2, EKG 8/8 QTc 440, UDS negative, wet prep test positive      Lab ordered: CMP, TSH   4. Tobacco Use Disorder  -- Patient smokes half pack of cigarette daily, refusing NicoDerm patch, will follow  -- Smoking cessation encouraged  5. Group and Therapy: -- Encouraged patient to participate in unit milieu and in scheduled group therapies   --Substance Use counseling: Patient was counseled regarding need to abstain completely from alcohol use she reports some form of binge drinking, will continue to counsel and encourage complete  abstaining, patient agrees.  6. Discharge Planning:   -- Social work and case management to assist with discharge planning and identification of hospital follow-up needs prior to discharge  -- Estimated LOS: 5-7 days  -- Discharge Concerns: Need to  establish a safety plan; Medication compliance and effectiveness  -- Discharge Goals: Return home with outpatient referrals for mental health follow-up including medication management/psychotherapy   The patient is agreeable with the medication plan, as above. We will monitor the patient's response to pharmacologic treatment, and adjust medications as necessary. Patient is encouraged to participate in group therapy while admitted to the psychiatric unit. We will address other chronic and acute stressors, which contributed to the patient's increased depression and anxiety, in order to reduce the risk of self-harm at discharge.   Physician Treatment Plan for Primary Diagnosis: MDD (major depressive disorder), recurrent severe, without psychosis ([name]) Long Term Goal(s): Improvement in symptoms so as ready for discharge  Short Term Goals: Ability to identify changes in lifestyle to reduce recurrence of condition will improve, Ability to verbalize feelings will improve, Ability to disclose and discuss suicidal ideas, and Ability to demonstrate self-control will improve   I certify that inpatient services furnished can reasonably be expected to improve the patient's condition.    Total Time Spent in Direct Patient Care:  I personally spent 55 minutes on the unit in direct patient care. The direct patient care time included face-to-face time with the patient, reviewing the patient's chart, communicating with other professionals, and coordinating care. Greater than 50% of this time was spent in counseling or coordinating care with the patient regarding goals of hospitalization, psycho-education, and discharge planning needs.    Sarita Bottom,  MD 8/9/20242:01 PM

## 2023-03-31 DIAGNOSIS — F332 Major depressive disorder, recurrent severe without psychotic features: Secondary | ICD-10-CM | POA: Diagnosis not present

## 2023-03-31 MED ORDER — VALACYCLOVIR HCL 500 MG PO TABS
1000.0000 mg | ORAL_TABLET | Freq: Two times a day (BID) | ORAL | Status: DC
  Administered 2023-03-31 – 2023-04-03 (×7): 1000 mg via ORAL
  Filled 2023-03-31 (×12): qty 2

## 2023-03-31 MED ORDER — ARIPIPRAZOLE 10 MG PO TABS
10.0000 mg | ORAL_TABLET | Freq: Every day | ORAL | Status: DC
  Filled 2023-03-31: qty 1

## 2023-03-31 MED ORDER — ARIPIPRAZOLE 5 MG PO TABS
5.0000 mg | ORAL_TABLET | Freq: Every day | ORAL | Status: DC
  Filled 2023-03-31 (×3): qty 1

## 2023-03-31 NOTE — [facility] Group Notes (Signed)
[facility] Group Notes:  (Nursing/MHT/Case Management/Adjunct)  Date:  03/31/2023  Time:  5:43 PM  Type of Therapy:  Nurse Education  Participation Level:  Active  Participation Quality:  Appropriate, Attentive, and Sharing  Affect:  Appropriate  Cognitive:  Alert, Appropriate, and Oriented  Insight:  Appropriate  Engagement in Group:  Developing/Improving and Engaged  Modes of Intervention:  Discussion, Education, Exploration, and Problem-solving  Summary of Progress/Problems: Group about Maslow's Hierarchy of Needs and how needs relate to mental health and stability. Healthy discussion about ways to improve self care and positive relationships.   Karren Burly 03/31/2023, 5:43 PM

## 2023-03-31 NOTE — Group Note (Signed)
Date:  03/31/2023 Time:  9:02 PM  Group Topic/Focus:  Wrap-Up Group:   The focus of this group is to help patients review their daily goal of treatment and discuss progress on daily workbooks.    Participation Level:  Active  Participation Quality:  Appropriate and Sharing  Affect:  Appropriate  Cognitive:  Appropriate  Insight: Appropriate  Engagement in Group:  Engaged  Modes of Intervention:  Discussion and Socialization  Additional Comments:  The patient stated that today was a good day. The patient rated their day 8/10. The patient goal for today was to get up and be more active and interact more; patient stated that she did achieve that goal. The patient goal for tomorrow is to work on coping skills and work towards being discharged either Monday or Tuesday.   Melissa [name] 03/31/2023, 9:02 PM

## 2023-03-31 NOTE — Plan of Care (Signed)

## 2023-03-31 NOTE — Group Note (Signed)
Date:  03/31/2023 Time:  11:08 AM  Group Topic/Focus:  Goals Group:   The focus of this group is to help patients establish daily goals to achieve during treatment and discuss how the patient can incorporate goal setting into their daily lives to aide in recovery. Orientation:   The focus of this group is to educate the patient on the purpose and policies of crisis stabilization and provide a format to answer questions about their admission.  The group details unit policies and expectations of patients while admitted.    Participation Level:  Did Not Attend  Participation Quality:   n/a  Affect:   n/a  Cognitive:   n/a  Insight: None  Engagement in Group:   n/a  Modes of Intervention:   n/a  Additional Comments:   Pt did  not attend.  Edmund Hilda  03/31/2023, 11:08 AM

## 2023-03-31 NOTE — Progress Notes (Signed)
   03/31/23 0805  Psych Admission Type (Psych Patients Only)  Admission Status Voluntary  Psychosocial Assessment  Patient Complaints Anxiety;Insomnia  Eye Contact Fair  Facial Expression Anxious;Sad  Affect Other (Comment) (Bright)  Speech Logical/coherent  Interaction Assertive  Motor Activity Other (Comment) (WNL)  Appearance/Hygiene Unremarkable  Behavior Characteristics Appropriate to situation  Mood Anxious  Thought Process  Coherency WDL  Content WDL  Delusions None reported or observed  Perception WDL  Hallucination None reported or observed  Judgment Poor  Confusion None  Danger to Self  Current suicidal ideation? Denies  Agreement Not to Harm Self Yes  Description of Agreement Verbal  Danger to Others  Danger to Others None reported or observed

## 2023-03-31 NOTE — Progress Notes (Addendum)
[facility] MD Progress Note  03/31/2023 2:34 PM Melissa [name]  MRN:  [id number]   Reason for Admission:  Melissa [name] is a 22 y.o., female with a past psychiatric history significant for MDD who presents to the [facility] from [facility] emergency room for evaluation and management of increased depression and SI. The patient is currently on Hospital Day 2.   Chart Review from last 24 hours:  The patient's chart was reviewed and nursing notes were reviewed. The patient's case was discussed in multidisciplinary team meeting. Per [facility] patient is compliant with psychotropic medication on the unit, as needed Atarax was used earlier today around 6 AM for anxiety with good efficacy reported.  Information Obtained Today During Patient Interview: The patient was seen and evaluated on the unit. On assessment today the patient reports had a better day yesterday was out of her room interacting with others, reports good sleep and appetite, describes her mood as "happy" denies feeling depressed or anxious at this time, when confronted with emergency room note patient denies telling the staff and emergency room that she has been suicidal for a week and she reports "I think it was a drinking" referring that she was probably under influence of alcohol and presented there.  She continues to deny passive or active SI intention or plan denies feeling hopeless or helpless at this time, denies HI or AVH, was further discussion she does admit to feeling depressed and anxious prior to admission triggered by stressors noted in her admission note.  She is compliant with medication on the unit and denies side effect to medications.  She does agree with recommendation to comply with medication after discharge as well as individual counseling as well as abstain completely from drinking after discharge.  Attempted to contact patient's mother Melissa [name] [phone] for collateral information, no  response  Addendum: Later around 2:00 PM I was able to get in touch with patient's mother, per patient's mother patient has been worsening gradually over the past few weeks has not been compliant with her medications at home for the past few weeks with increased depression noted, mother also reports that patient has been doing doing extensively seems to be happening in episodes over the past few months which she would spend almost all of her paycheck on gambling with further questions mother describes episodes of patient feeling lower with increased fast speech decreased need for sleep and increased energy reporting symptoms consistent with manic episodes that occasionally last up to 1 week to 10 days on and off with last time about a week ago.  Patient's mother also reports patient's grandmother has diagnosis of schizophrenia, reports patient reported to her hearing voices 2 weeks ago telling her to harm herself and probably related to worsening depression.  Mother reports patient had recent episodes of prostituting herself to get money to be able to gamble.  Per mother patient's children's custody would probably be fully transferred to patient's parents but patient's mother reported that they would allow her to see her children as often as she wants and both her parents will remain supportive. Patient's mother reports patient has been drinking alcohol more than once weekly average twice weekly but no history of DTs or withdrawal seizures reported. Given information reported by mother I will change medications as noted below to target mood stabilization. Sleep  Sleep: Good sleep reported  Principal Problem: Bipolar 1 disorder, mixed ([name]) Diagnosis: Principal Problem:   Bipolar 1 disorder, mixed ([name]) Active Problems:  GAD (generalized anxiety disorder)    Past Psychiatric History:  Prior Psychiatric diagnoses: MDD, GAD Past Psychiatric Hospitalizations: 3 psychiatric hospitalizations as a  child, to as an adult in April 2023 in April 2020 for to this facility   History of self mutilation: Denies Past suicide attempts: Reports 1 suicide attempt when attempted to overdose on her seizure medication in 2015 Past history of HI, violent or aggressive behavior: Denies   Past Psychiatric medications trials: Recent medication regimen including Lexapro risperidone Atarax and BuSpar with good efficacy reported but seems to be minimizing recent symptoms as noted above History of ECT/TMS: Denies   Outpatient psychiatric Follow up: Follows up at urgent care Prior Outpatient Therapy: Follows up at the urgent care  Past Medical History:  Past Medical History:  Diagnosis Date   Anemia    Anxiety    Asthma    inhaler. 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   Seizures ([name])    last one in 2015 - on meds   Sickle cell trait ([name])    TBI (traumatic brain injury) ([name]) 2006    Past Surgical History:  Procedure Laterality Date   APPENDECTOMY     CESAREAN SECTION N/A 08/31/2021   Procedure: CESAREAN SECTION;  Surgeon: Kathrynn Running, MD;  Location: [facility] LD ORS;  Service: Obstetrics;  Laterality: N/A;   CESAREAN SECTION N/A 06/05/2022   Procedure: CESAREAN SECTION;  Surgeon: Hermina Staggers, MD;  Location: [facility] LD ORS;  Service: Obstetrics;  Laterality: N/A;   HERNIA REPAIR     INTESTINAL MALROTATION REPAIR  2001   Family History:  Family History  Problem Relation Age of  Onset   Depression Mother    Stroke Mother    Obesity Mother    Post-traumatic stress disorder Mother    Anxiety disorder Mother    Hypertension Mother    Diabetes Mother    Schizophrenia Father    Kidney disease Father    Family Psychiatric  History:  Psychiatric illness: Reports grandmother and sister with bipolar disorder diagnosis Suicide: Denies Substance Abuse: Reports some family members on mother side with alcohol use Social History:  Living situation: Currently lives with a friend for the past 3 months.  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 [employer] service: Denies Legal history: Denies pending charges or court dates except for court for her children custody on August 26 Trauma: Denies Access to guns: Denies  Current Medications: Current Facility-Administered Medications  Medication Dose Route Frequency Provider Last Rate Last Admin   acetaminophen (TYLENOL) tablet 650 mg  650 mg Oral Q6H PRN Lenox Ponds, NP  650 mg at 03/29/23 1732   alum & mag hydroxide-simeth (MAALOX/MYLANTA) 200-200-20 MG/5ML suspension 30 mL  30 mL Oral Q4H PRN Lenox Ponds, NP       busPIRone (BUSPAR) tablet 10 mg  10 mg Oral TID Sarita Bottom, MD   10 mg at 03/31/23 1311   escitalopram (LEXAPRO) tablet 10 mg  10 mg Oral Daily Abbott Pao, , MD   10 mg at 03/31/23 0805   hydrOXYzine (ATARAX) tablet 50 mg  50 mg Oral TID PRN Lenox Ponds, NP   50 mg at 03/31/23 5284   magnesium hydroxide (MILK OF MAGNESIA) suspension 30 mL  30 mL Oral Daily PRN Lenox Ponds, NP       metroNIDAZOLE  (FLAGYL) tablet 500 mg  500 mg Oral Q12H Ravi, Himabindu, MD   500 mg at 03/31/23 1324   nicotine (NICODERM CQ - dosed in mg/24 hours) patch 14 mg  14 mg Transdermal Q0600 Lenox Ponds, NP       risperiDONE (RISPERDAL) tablet 0.5 mg  0.5 mg Oral QHS Lenox Ponds, NP   0.5 mg at 03/30/23 2136   traZODone (DESYREL) tablet 50 mg  50 mg Oral QHS PRN Lenox Ponds, NP       valACYclovir (VALTREX) tablet 1,000 mg  1,000 mg Oral BID Abbott Pao, , MD   1,000 mg at 03/31/23 1100   ziprasidone (GEODON) injection 20 mg  20 mg Intramuscular Daily PRN Lenox Ponds, NP        Lab Results:  Results for orders placed or performed during the hospital encounter of 03/29/23 (from the past 48 hour(s))  Hemoglobin A1c     Status: None   Collection Time: 03/30/23  6:55 AM  Result Value Ref Range   Hgb A1c MFr Bld 5.2 4.8 - 5.6 %    Comment: (NOTE) Pre diabetes:          5.7%-6.4%  Diabetes:              >6.4%  Glycemic control for   <7.0% adults with diabetes    Mean Plasma Glucose 102.54 mg/dL    Comment: Performed at [facility], [address], [place], [place] [address]  Lipid panel     Status: Abnormal   Collection Time: 03/30/23  6:55 AM  Result Value Ref Range   Cholesterol 148 0 - 200 mg/dL   Triglycerides 69 <725 mg/dL   HDL 32 (L) >36 mg/dL   Total CHOL/HDL Ratio 4.6 RATIO   VLDL 14 0 - 40 mg/dL   LDL Cholesterol 644 (H) 0 - 99 mg/dL    Comment:        Total Cholesterol/HDL:CHD Risk Coronary Heart Disease Risk Table                     Men   Women  1/2 Average Risk   3.4   3.3  Average Risk       5.0   4.4  2 X Average Risk   9.6   7.1  3 X Average Risk  23.4   11.0        Use the calculated Patient Ratio above and the CHD Risk Table to determine the patient's CHD Risk.        ATP III CLASSIFICATION (LDL):  <100     mg/dL   Optimal  034-742  mg/dL   Near or Above  Optimal  130-159  mg/dL   Borderline  409-811  mg/dL   High  >914      mg/dL   Very High Performed at [facility], [address], [place], [place] [address]   Comprehensive metabolic panel     Status: Abnormal   Collection Time: 03/31/23  6:54 AM  Result Value Ref Range   Sodium 137 135 - 145 mmol/L   Potassium 3.8 3.5 - 5.1 mmol/L   Chloride 106 98 - 111 mmol/L   CO2 21 (L) 22 - 32 mmol/L   Glucose, Bld 96 70 - 99 mg/dL    Comment: Glucose reference range applies only to samples taken after fasting for at least 8 hours.   BUN 12 6 - 20 mg/dL   Creatinine, Ser 6.21 0.44 - 1.00 mg/dL   Calcium 9.1 8.9 - 30.8 mg/dL   Total Protein 7.3 6.5 - 8.1 g/dL   Albumin 3.8 3.5 - 5.0 g/dL   AST 16 15 - 41 U/L   ALT 15 0 - 44 U/L   Alkaline Phosphatase 91 38 - 126 U/L   Total Bilirubin 0.4 0.3 - 1.2 mg/dL   GFR, Estimated >65 >78 mL/min    Comment: (NOTE) Calculated using the CKD-EPI Creatinine Equation (2021)    Anion gap 10 5 - 15    Comment: Performed at [facility], [address] [place], [place] [address]  TSH     Status: None   Collection Time: 03/31/23  6:54 AM  Result Value Ref Range   TSH 0.623 0.350 - 4.500 uIU/mL    Comment: Performed by a 3rd Generation assay with a functional sensitivity of <=0.01 uIU/mL. Performed at [facility], [address], [place], [place] [address]     Blood Alcohol level:  Lab Results  Component Value Date   [facility] <10 03/29/2023   ETH <10 11/14/2022    Metabolic Disorder Labs: Lab Results  Component Value Date   HGBA1C 5.2 03/30/2023   MPG 102.54 03/30/2023   MPG 105 11/14/2022   Lab Results  Component Value Date   PROLACTIN 14.4 11/14/2022   PROLACTIN 8.8 01/01/2017   Lab Results  Component Value Date   CHOL 148 03/30/2023   TRIG 69 03/30/2023   HDL 32 (L) 03/30/2023   CHOLHDL 4.6 03/30/2023   VLDL 14 03/30/2023   LDLCALC 102 (H) 03/30/2023   LDLCALC 101 (H) 11/14/2022    Physical Findings: AIMS: Facial and Oral Movements Muscles of  Facial Expression: None, normal Lips and Perioral Area: None, normal Jaw: None, normal Tongue: None, normal,Extremity Movements Upper (arms, wrists, hands, fingers): None, normal Lower (legs, knees, ankles, toes): None, normal, Trunk Movements Neck, shoulders, hips: None, normal, Overall Severity Severity of abnormal movements (highest score from questions above): None, normal Incapacitation due to abnormal movements: None, normal Patient's awareness of abnormal movements (rate only patient's report): No Awareness, Dental Status Current problems with teeth and/or dentures?: No Does patient usually wear dentures?: No  CIWA:    COWS:     Musculoskeletal: Strength & Muscle Tone: within normal limits Gait & Station: normal Patient leans: N/A  Psychiatric Specialty Exam:  General Appearance: Dressed in hospital gown, disheveled, appears older than stated age   Behavior: Cooperative   Psychomotor Activity:No psychomotor agitation or retardation noted   Eye Contact: Improved, fair Speech: Normal amount, normal tone and volume     Mood: Less dysphoric Affect: More fluent and interactive affect, less depressed  Thought Process: Linear and goal-directed Descriptions of Associations: Intact yet concrete Thought Content: Hallucinations: Denies AH, VH, does not appear responding to stimuli Delusions: No paranoia  Suicidal Thoughts: Denies passive or active SI, intention, plan  Homicidal Thoughts: Denies HI, intention, plan    Alertness/Orientation: Alert and oriented   Insight: Improved yet limited Judgment: Improved yet limited   Memory: Fair   Art therapist  Concentration: Fair Attention Span: Fair Recall: [employer] of Knowledge: Fair   Assets  Assets: Desire for Improvement; Manufacturing systems engineer; Physical Health; Resilience; Talents/Skills; Vocational/Educational    Physical Exam: Physical Exam ROS Blood pressure 99/87, pulse (!) 110, temperature 98.5 F  (36.9 C), temperature source Oral, resp. rate 16, height 5\' 7"  (1.702 m), weight 108 kg, SpO2 100%, unknown if currently breastfeeding. Body mass index is 37.29 kg/m.   Treatment Plan Summary: Daily contact with patient to assess and evaluate symptoms and progress in treatment and Medication management  ASSESSMENT:  Diagnoses / Active Problems: Principal Problem: Bipolar 1 disorder, mixed ([name]) Diagnosis: Principal Problem:   Bipolar 1 disorder, mixed ([name]) Active Problems:   GAD (generalized anxiety disorder)   PLAN: Safety and Monitoring:             -- Voluntary admission to inpatient psychiatric unit for safety, stabilization and treatment             -- Daily contact with patient to assess and evaluate symptoms and progress in treatment             -- Patient's case to be discussed in multi-disciplinary team meeting             -- Observation Level : q15 minute checks             -- Vital signs:  q12 hours             -- Precautions: suicide, elopement, and assault   2. Medications:              Discontinue Lexapro given reported manic episodes recently  Start trial of Abilify 5 mg daily for mood stabilization and depression to titrate after 2 days to 10 mg daily for mood stabilization also will help with psychotic symptoms.  If Abilify is effective and well-tolerated will start Abilify Maintena LAI monthly to help with long-term compliance and decrease risk of decompensation             Continue BuSpar 10 mg 3 times daily for anxiety             Continue Atarax 50 mg 3 times daily as needed for anxiety             Discontinue risperidone for lack of efficacy, patient will be started on Abilify             Continue trazodone 50 mg at bedtime as needed for sleep               Patient with history of seizure disorder last seizure reported 2015, not on antiepileptic, will continue to follow.               Continue Flagyl 500 mg twice daily for bacterial vaginosis, for 14  doses, started in emergency room             Restart Valtrex 1000 mg twice daily for chronic genital herpes, home medication patient has been on it for over a year  The risks/benefits/side-effects/alternatives to this medication were discussed in detail  with the patient and time was given for questions. The patient consents to medication trial.                -- Metabolic profile and EKG monitoring obtained while on an atypical antipsychotic (BMI: Lipid Panel: HbgA1c: QTc:)                 3. Labs Reviewed: CBC within normal level, CMP within normal limit except for slightly low potassium 3.3, lipid panel within normal level except for low HDL 32 and high LDL 102, pregnancy test negative, hemoglobin A1c 5.2, EKG 8/8 QTc 440, UDS negative, wet prep test positive CMP 8/10 no abnormalities noted including potassium 3.8, TSH within normal level      Lab ordered: None   4. Tobacco Use Disorder             -- Patient smokes half pack of cigarette daily, refusing NicoDerm patch, will follow             -- Smoking cessation encouraged   5. Group and Therapy: -- Encouraged patient to participate in unit milieu and in scheduled group therapies              --Substance Use counseling: Patient was counseled regarding need to abstain completely from alcohol use she reports some form of binge drinking, will continue to counsel and encourage complete abstaining, patient agrees.   6. Discharge Planning:              -- Social work and case management to assist with discharge planning and identification of hospital follow-up needs prior to discharge             -- Estimated LOS: 5-7 days             -- Discharge Concerns: Need to establish a safety plan; Medication compliance and effectiveness             -- Discharge Goals: Return home with outpatient referrals for mental health follow-up including medication management/psychotherapy     The patient is agreeable with the medication plan, as above. We  will monitor the patient's response to pharmacologic treatment, and adjust medications as necessary. Patient is encouraged to participate in group therapy while admitted to the psychiatric unit. We will address other chronic and acute stressors, which contributed to the patient's increased depression and anxiety, in order to reduce the risk of self-harm at discharge.     Physician Treatment Plan for Primary Diagnosis: MDD (major depressive disorder), recurrent severe, without psychosis ([name]) Long Term Goal(s): Improvement in symptoms so as ready for discharge   Short Term Goals: Ability to identify changes in lifestyle to reduce recurrence of condition will improve, Ability to verbalize feelings will improve, Ability to disclose and discuss suicidal ideas, and Ability to demonstrate self-control will improve     I certify that inpatient services furnished can reasonably be expected to improve the patient's condition.      Total Time Spent in Direct Patient Care:  I personally spent 35 minutes on the unit in direct patient care. The direct patient care time included face-to-face time with the patient, reviewing the patient's chart, communicating with other professionals, and coordinating care. Greater than 50% of this time was spent in counseling or coordinating care with the patient regarding goals of hospitalization, psycho-education, and discharge planning needs.    Abbott Pao, MD 03/31/2023, 2:34 PM

## 2023-03-31 NOTE — Group Note (Signed)
Date:  03/31/2023 Time:  12:55 PM  Group Topic/Focus:  Dimensions of Wellness:   The focus of this group is to introduce the topic of wellness and discuss the role each dimension of wellness plays in total health.    Participation Level:  Did Not Attend  Participation Quality:   n/a  Affect:   n/a  Cognitive:   n/a  Insight: None  Engagement in Group:   n/a  Modes of Intervention:   n/a  Additional Comments:   Pt did not attend.  Melissa [name]  03/31/2023, 12:55 PM

## 2023-03-31 NOTE — Progress Notes (Signed)
   03/31/23 1830  Psych Admission Type (Psych Patients Only)  Admission Status Voluntary/72 hour document signed  Date 72 hour document signed  03/31/23  Time 72 hour document signed  1830  Provider Notified (First and Last Name) (see details for LINK to note) Dr. Abbott Pao

## 2023-03-31 NOTE — [facility] Group Notes (Signed)
LCSW Wellness Group Note   03/31/2023 1:00pm  Type of Group and Topic: Psychoeducational Group:  Wellness  Participation Level:  minimal  Description of Group  Wellness group introduces the topic and its focus on developing healthy habits across the spectrum and its relationship to a decrease in hospital admissions.  Six areas of wellness are discussed: physical, social spiritual, intellectual, occupational, and emotional.  Patients are asked to consider their current wellness habits and to identify areas of wellness where they are interested and able to focus on improvements.    Therapeutic Goals Patients will understand components of wellness and how they can positively impact overall health.  Patients will identify areas of wellness where they have developed good habits. Patients will identify areas of wellness where they would like to make improvements.    Summary of Patient Progress: pt present for most of group, called out by provider at one point.  Pt did not participate in the discussion.  She did respond when asked directly by CSW and named social and emotional as wellness areas of strength.  She named financial as a wellness area that needs work.      Therapeutic Modalities: Cognitive Behavioral Therapy Psychoeducation    Lorri Frederick, LCSW

## 2023-04-01 DIAGNOSIS — F332 Major depressive disorder, recurrent severe without psychotic features: Secondary | ICD-10-CM | POA: Diagnosis not present

## 2023-04-01 MED ORDER — RISPERIDONE 1 MG PO TABS
1.0000 mg | ORAL_TABLET | Freq: Two times a day (BID) | ORAL | Status: DC
  Administered 2023-04-01 – 2023-04-03 (×5): 1 mg via ORAL
  Filled 2023-04-01 (×11): qty 1

## 2023-04-01 MED ORDER — DOXYCYCLINE HYCLATE 100 MG PO TABS
100.0000 mg | ORAL_TABLET | Freq: Two times a day (BID) | ORAL | Status: DC
  Administered 2023-04-01 – 2023-04-03 (×5): 100 mg via ORAL
  Filled 2023-04-01: qty 1
  Filled 2023-04-01: qty 9
  Filled 2023-04-01 (×7): qty 1
  Filled 2023-04-01: qty 9
  Filled 2023-04-01: qty 1

## 2023-04-01 NOTE — Progress Notes (Signed)
Pt has been calm and cooperative on the unit. She reports feeling "better." She said that prior to admission, she had an argument with her "daughter's father" and that she was intoxicated at that time. She has also been "hurt" about losing the custody of her children although, her parents will have custody of them. She reports good sleep and appetite. She reports wanting to stop taking her Abilify because it causes her dizziness. Informed pt to also discuss this with the provider in the morning. Educated pt about continuing to take her prescribed medications to prevent a relapse in symptoms to include hallucinations. Pt denies any other side effects from her medications. Pt denies SI/HI and AVH. Active listening, reassurance, and support provided. Q 15 min safety checks continue. Pt's safety has been maintained.   03/31/23 2134  Psych Admission Type (Psych Patients Only)  Admission Status Voluntary/72 hour document signed  Psychosocial Assessment  Patient Complaints Depression;Sadness;Worrying  Eye Contact Fair  Facial Expression Sad  Affect Depressed;Sad  Speech Logical/coherent  Interaction Assertive  Motor Activity Other (Comment) (WNL)  Appearance/Hygiene Unremarkable  Behavior Characteristics Cooperative;Appropriate to situation  Mood Depressed;Sad  Thought Process  Coherency WDL  Content WDL  Delusions None reported or observed  Perception WDL  Hallucination None reported or observed  Judgment Limited  Confusion None  Danger to Self  Current suicidal ideation? Denies  Agreement Not to Harm Self Yes  Description of Agreement verbally contracts for safety  Danger to Others  Danger to Others None reported or observed

## 2023-04-01 NOTE — Progress Notes (Signed)
[facility] MD Progress Note  04/01/2023 9:46 AM Melissa [name]  MRN:  [id number]   Reason for Admission:  Melissa [name] is a 22 y.o., female with a past psychiatric history significant for MDD who presents to the [facility] from [facility] emergency room for evaluation and management of increased depression and SI. The patient is currently on Hospital Day 3.   Chart Review from last 24 hours:  The patient's chart was reviewed and nursing notes were reviewed. The patient's case was discussed in multidisciplinary team meeting. Per [facility] patient is compliant with psychotropic medication on the unit, as needed Atarax was used last on 8/10 around 6 AM for anxiety with good efficacy reported, no other as needed medication needed were reviewed with.  Information Obtained Today During Patient Interview: The patient was seen and evaluated on the unit. On assessment today patient reports she does not want to take Abilify as she received it in the past and it did not help her.  With further discussion she wants to stay on risperidone, I discussed with patient information obtained from patient's mother yesterday and now she admits to manic symptoms reported by her mother, she reports she has been gambling on and off for the past 2 to 3 years, she reports feeling regretful after gambling using big amounts of money, also admits to other manic symptoms reported including decreased need for sleep increased energy increased speech and racing thoughts and does admit to last incident was prior to this admission, she confirms sometimes the symptoms can last up to a week.  She denies any current manic symptoms and continues to minimize her depressed mood preoccupied with wanting to be discharged.  Patient signed 72 hours release request on 8/10 at 4:30 PM.  I discussed with patient medication adjustment as noted below.  Patient denies any current SI intention or plan denies HI or AVH she does agree  if responds well to risperidone to be started on [place] injection to help with long-term compliance, will follow.   Patient reports fair sleep last night as well as fair appetite, she has been attending groups with no issues reported.    Collateral information were obtained from patient's mother on 8/10, per patient's mother patient has been worsening gradually over the past few weeks has not been compliant with her medications at home for the past few weeks with increased depression noted, mother also reports that patient has been doing doing extensively seems to be happening in episodes over the past few months which she would spend almost all of her paycheck on gambling with further questions mother describes episodes of patient feeling lower with increased fast speech decreased need for sleep and increased energy reporting symptoms consistent with manic episodes that occasionally last up to 1 week to 10 days on and off with last time about a week ago.  Patient's mother also reports patient's grandmother has diagnosis of schizophrenia, reports patient reported to her hearing voices 2 weeks ago telling her to harm herself and probably related to worsening depression.  Mother reports patient had recent episodes of prostituting herself to get money to be able to gamble.  Per mother patient's children's custody would probably be fully transferred to patient's parents but patient's mother reported that they would allow her to see her children as often as she wants and both her parents will remain supportive. Patient's mother reports patient has been drinking alcohol more than once weekly average twice weekly but no history  of DTs or withdrawal seizures reported.  Sleep  Sleep: Good sleep reported  Principal Problem: Bipolar 1 disorder, mixed ([name]) Diagnosis: Principal Problem:   Bipolar 1 disorder, mixed ([name]) Active Problems:   GAD (generalized anxiety disorder)    Past Psychiatric History:   Prior Psychiatric diagnoses: MDD, GAD Past Psychiatric Hospitalizations: 3 psychiatric hospitalizations as a child, to as an adult in April 2023 in April 2020 for to this facility   History of self mutilation: Denies Past suicide attempts: Reports 1 suicide attempt when attempted to overdose on her seizure medication in 2015 Past history of HI, violent or aggressive behavior: Denies   Past Psychiatric medications trials: Recent medication regimen including Lexapro risperidone Atarax and BuSpar with good efficacy reported but seems to be minimizing recent symptoms as noted above History of ECT/TMS: Denies   Outpatient psychiatric Follow up: Follows up at urgent care Prior Outpatient Therapy: Follows up at the urgent care  Past Medical History:  Past Medical History:  Diagnosis Date   Anemia    Anxiety    Asthma    inhaler. 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   Seizures ([name])    last one in 2015 - on meds   Sickle cell trait ([name])    TBI (traumatic brain injury) ([name]) 2006    Past Surgical History:  Procedure Laterality Date   APPENDECTOMY     CESAREAN SECTION N/A 08/31/2021   Procedure: CESAREAN SECTION;  Surgeon: Kathrynn Running, MD;  Location: [facility] LD ORS;  Service: Obstetrics;  Laterality: N/A;   CESAREAN SECTION N/A 06/05/2022   Procedure: CESAREAN SECTION;  Surgeon: Hermina Staggers, MD;  Location: [facility] LD ORS;  Service: Obstetrics;  Laterality: N/A;    HERNIA REPAIR     INTESTINAL MALROTATION REPAIR  2001   Family History:  Family History  Problem Relation Age of Onset   Depression Mother    Stroke Mother    Obesity Mother    Post-traumatic stress disorder Mother    Anxiety disorder Mother    Hypertension Mother    Diabetes Mother    Schizophrenia Father    Kidney disease Father    Family Psychiatric  History:  Psychiatric illness: Reports grandmother and sister with bipolar disorder diagnosis Suicide: Denies Substance Abuse: Reports some family members on mother side with alcohol use Social History:  Living situation: Currently lives with a friend for the past 3 months.  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 [employer] service: Denies Legal history: Denies pending charges or court dates except for court for her children custody on August 26 Trauma: Denies Access to guns:  Denies  Current Medications: Current Facility-Administered Medications  Medication Dose Route Frequency Provider Last Rate Last Admin   acetaminophen (TYLENOL) tablet 650 mg  650 mg Oral Q6H PRN Lenox Ponds, NP   650 mg at 03/29/23 1732   alum & mag hydroxide-simeth (MAALOX/MYLANTA) 200-200-20 MG/5ML suspension 30 mL  30 mL Oral Q4H PRN Lenox Ponds, NP       ARIPiprazole (ABILIFY) tablet 5 mg  5 mg Oral Daily Abbott Pao, , MD       Followed by   Melene Muller ON 04/02/2023] ARIPiprazole (ABILIFY) tablet 10 mg  10 mg Oral Daily Abbott Pao, , MD       busPIRone (BUSPAR) tablet 10 mg  10 mg Oral TID Sarita Bottom, MD   10 mg at 04/01/23 1610   hydrOXYzine  (ATARAX) tablet 50 mg  50 mg Oral TID PRN Lenox Ponds, NP   50 mg at 03/31/23 9604   magnesium hydroxide (MILK OF MAGNESIA) suspension 30 mL  30 mL Oral Daily PRN Lenox Ponds, NP       metroNIDAZOLE (FLAGYL) tablet 500 mg  500 mg Oral Q12H Ravi, Himabindu, MD   500 mg at 04/01/23 5409   nicotine (NICODERM CQ - dosed in mg/24 hours) patch 14 mg  14 mg Transdermal Q0600 Lenox Ponds, NP       traZODone (DESYREL) tablet 50 mg  50 mg Oral QHS PRN Lenox Ponds, NP       valACYclovir (VALTREX) tablet 1,000 mg  1,000 mg Oral BID Abbott Pao, , MD   1,000 mg at 04/01/23 0825   ziprasidone (GEODON) injection 20 mg  20 mg Intramuscular Daily PRN Lenox Ponds, NP        Lab Results:  Results for orders placed or performed during the hospital encounter of 03/29/23 (from the past 48 hour(s))  Comprehensive metabolic panel     Status: Abnormal   Collection Time: 03/31/23  6:54 AM  Result Value Ref Range   Sodium 137 135 - 145 mmol/L   Potassium 3.8 3.5 - 5.1 mmol/L   Chloride 106 98 - 111 mmol/L   CO2 21 (L) 22 - 32 mmol/L   Glucose, Bld 96 70 - 99 mg/dL    Comment: Glucose reference range applies only to samples taken after fasting for at least 8 hours.   BUN 12 6 - 20 mg/dL   Creatinine, Ser 8.11 0.44 - 1.00 mg/dL   Calcium 9.1 8.9 - 91.4 mg/dL   Total Protein 7.3 6.5 - 8.1 g/dL   Albumin 3.8 3.5 - 5.0 g/dL   AST 16 15 - 41 U/L   ALT 15 0 - 44 U/L   Alkaline Phosphatase 91 38 - 126 U/L   Total Bilirubin 0.4 0.3 - 1.2 mg/dL   GFR, Estimated >78 >29 mL/min    Comment: (NOTE) Calculated using the CKD-EPI Creatinine Equation (2021)    Anion gap 10 5 - 15    Comment: Performed at [facility], [address], [place], [place] [address]  TSH     Status: None   Collection Time: 03/31/23  6:54 AM  Result Value Ref Range   TSH 0.623 0.350 - 4.500 uIU/mL    Comment: Performed by a 3rd Generation assay with a functional sensitivity of <=0.01 uIU/mL. Performed  at [facility], [address], [place], [place] [address]     Blood Alcohol level:  Lab Results  Component Value Date   ETH <  10 03/29/2023   ETH <10 11/14/2022    Metabolic Disorder Labs: Lab Results  Component Value Date   HGBA1C 5.2 03/30/2023   MPG 102.54 03/30/2023   MPG 105 11/14/2022   Lab Results  Component Value Date   PROLACTIN 14.4 11/14/2022   PROLACTIN 8.8 01/01/2017   Lab Results  Component Value Date   CHOL 148 03/30/2023   TRIG 69 03/30/2023   HDL 32 (L) 03/30/2023   CHOLHDL 4.6 03/30/2023   VLDL 14 03/30/2023   LDLCALC 102 (H) 03/30/2023   LDLCALC 101 (H) 11/14/2022    Physical Findings: AIMS: Facial and Oral Movements Muscles of Facial Expression: None, normal Lips and Perioral Area: None, normal Jaw: None, normal Tongue: None, normal,Extremity Movements Upper (arms, wrists, hands, fingers): None, normal Lower (legs, knees, ankles, toes): None, normal, Trunk Movements Neck, shoulders, hips: None, normal, Overall Severity Severity of abnormal movements (highest score from questions above): None, normal Incapacitation due to abnormal movements: None, normal Patient's awareness of abnormal movements (rate only patient's report): No Awareness, Dental Status Current problems with teeth and/or dentures?: No Does patient usually wear dentures?: No  CIWA:    COWS:     Musculoskeletal: Strength & Muscle Tone: within normal limits Gait & Station: normal Patient leans: N/A  Psychiatric Specialty Exam:  General Appearance: Dressed in hospital gown, disheveled, appears older than stated age   Behavior: Cooperative   Psychomotor Activity:No psychomotor agitation or retardation noted   Eye Contact: Improved, fair Speech: Normal amount, normal tone and volume     Mood: Less dysphoric Affect: More fluent and interactive affect, less depressed   Thought Process: Linear and goal-directed Descriptions of Associations: Intact  yet concrete Thought Content: Hallucinations: Denies AH, VH, does not appear responding to stimuli Delusions: No paranoia  Suicidal Thoughts: Denies passive or active SI, intention, plan  Homicidal Thoughts: Denies HI, intention, plan    Alertness/Orientation: Alert and oriented   Insight: Improved yet limited Judgment: Improved yet limited   Memory: Fair   Art therapist  Concentration: Fair Attention Span: Fair Recall: [employer] of Knowledge: Fair   Assets  Assets: Desire for Improvement; Manufacturing systems engineer; Physical Health; Resilience; Talents/Skills; Vocational/Educational    Physical Exam: Physical Exam ROS Blood pressure 112/73, pulse 75, temperature 98.5 F (36.9 C), temperature source Oral, resp. rate 16, height 5\' 7"  (1.702 m), weight 108 kg, SpO2 100%, unknown if currently breastfeeding. Body mass index is 37.29 kg/m.   Treatment Plan Summary: Daily contact with patient to assess and evaluate symptoms and progress in treatment and Medication management  ASSESSMENT:  Diagnoses / Active Problems: Principal Problem: Bipolar 1 disorder, mixed ([name]) Diagnosis: Principal Problem:   Bipolar 1 disorder, mixed ([name]) Active Problems:   GAD (generalized anxiety disorder)   PLAN: Safety and Monitoring:             -- Voluntary admission to inpatient psychiatric unit for safety, stabilization and treatment             -- Daily contact with patient to assess and evaluate symptoms and progress in treatment             -- Patient's case to be discussed in multi-disciplinary team meeting             -- Observation Level : q15 minute checks             -- Vital signs:  q12 hours             --  Precautions: suicide, elopement, and assault   2. Medications:                Discontinue Abilify given history of lack of efficacy previously, restart risperidone at a higher dose 1 mg twice daily for mood stabilization, if effective and well-tolerated will consider  switching to [place] to help with long-term compliance and decrease risk of decompensation.               Continue BuSpar 10 mg 3 times daily for anxiety             Continue Atarax 50 mg 3 times daily as needed for anxiety                         Continue trazodone 50 mg at bedtime as needed for sleep               Patient with history of seizure disorder last seizure reported 2015, not on antiepileptic, will continue to follow.   Patient reports she was supposed to be on doxycycline 100 mg twice daily for total of 7 days for STD but she is unsure how many doses she received prior to admission, will restart 7 days course and follow.             Continue Flagyl 500 mg twice daily for STD, for 14 doses, started in emergency room            Continue Valtrex 1000 mg twice daily for chronic genital herpes, home medication patient has been on it for over a year  The risks/benefits/side-effects/alternatives to this medication were discussed in detail with the patient and time was given for questions. The patient consents to medication trial.                -- Metabolic profile and EKG monitoring obtained while on an atypical antipsychotic (BMI: Lipid Panel: HbgA1c: QTc:)                 3. Labs Reviewed: CBC within normal level, CMP within normal limit except for slightly low potassium 3.3, lipid panel within normal level except for low HDL 32 and high LDL 102, pregnancy test negative, hemoglobin A1c 5.2, EKG 8/8 QTc 440, UDS negative, wet prep test positive CMP 8/10 no abnormalities noted including potassium 3.8, TSH within normal level      Lab ordered: None   4. Tobacco Use Disorder             -- Patient smokes half pack of cigarette daily, refusing NicoDerm patch, will follow             -- Smoking cessation encouraged   5. Group and Therapy: -- Encouraged patient to participate in unit milieu and in scheduled group therapies              --Substance Use counseling: Patient was  counseled regarding need to abstain completely from alcohol use she reports some form of binge drinking, will continue to counsel and encourage complete abstaining, patient agrees.   6. Discharge Planning:              -- Social work and case management to assist with discharge planning and identification of hospital follow-up needs prior to discharge             -- Estimated LOS: 5-7 days             -- Discharge Concerns: Need  to establish a safety plan; Medication compliance and effectiveness             -- Discharge Goals: Return home with outpatient referrals for mental health follow-up including medication management/psychotherapy     The patient is agreeable with the medication plan, as above. We will monitor the patient's response to pharmacologic treatment, and adjust medications as necessary. Patient is encouraged to participate in group therapy while admitted to the psychiatric unit. We will address other chronic and acute stressors, which contributed to the patient's increased depression and anxiety, in order to reduce the risk of self-harm at discharge.     Physician Treatment Plan for Primary Diagnosis: MDD (major depressive disorder), recurrent severe, without psychosis ([name]) Long Term Goal(s): Improvement in symptoms so as ready for discharge   Short Term Goals: Ability to identify changes in lifestyle to reduce recurrence of condition will improve, Ability to verbalize feelings will improve, Ability to disclose and discuss suicidal ideas, and Ability to demonstrate self-control will improve     I certify that inpatient services furnished can reasonably be expected to improve the patient's condition.      Total Time Spent in Direct Patient Care:  I personally spent 35 minutes on the unit in direct patient care. The direct patient care time included face-to-face time with the patient, reviewing the patient's chart, communicating with other professionals, and coordinating care.  Greater than 50% of this time was spent in counseling or coordinating care with the patient regarding goals of hospitalization, psycho-education, and discharge planning needs.    Abbott Pao, MD 04/01/2023, 9:46 AM

## 2023-04-01 NOTE — Group Note (Signed)
Date:  04/01/2023 Time:  4:36 PM  Group Topic/Focus:  Goals Group:   The focus of this group is to help patients establish daily goals to achieve during treatment and discuss how the patient can incorporate goal setting into their daily lives to aide in recovery. Orientation:   The focus of this group is to educate the patient on the purpose and policies of crisis stabilization and provide a format to answer questions about their admission.  The group details unit policies and expectations of patients while admitted.    Participation Level:  Active  Participation Quality:  Appropriate  Affect:  Appropriate  Cognitive:  Appropriate  Insight: Appropriate  Engagement in Group:  Engaged  Modes of Intervention:  Activity, Orientation, and Rapport Building  Additional Comments:   Pt attended and participated in the Orientation/Goals group. Pt goal for today is to write down her triggers and coping skills.  Edmund Hilda  04/01/2023, 4:36 PM

## 2023-04-01 NOTE — Plan of Care (Signed)

## 2023-04-01 NOTE — Progress Notes (Signed)
Pt reported feeling drowsy since taking her  PRN vistaril during the afternoon. Her blood pressure and pulse were checked at 2050 and are within her baseline. Educated pt about the importance of adequate hydration and food intake while taking her medications. Provided pt a pitcher of Gatorade as well. She reports good sleep. She rated her anxiety and depression a 5 on a scale of 0-10 (10 being the worst). Her goal is to identify her triggers. She identifies her coping skills as deep breathing, walking away, cooking, and talking to her grandmother. She denies having any contact with her children or parents since being hospitalized. She is worried about the side effects she may have after taking her invega sustenna injection possibly next week. Discussed possible side effects and a handout about the injection will be provided. She is calm and cooperative on the unit. She has been participating in groups. Pt denies SI/HI and AVH. Active listening, reassurance, and support provided. Q 15 min safety checks continue. Pt's safety has been maintained.   04/01/23 1950  Psych Admission Type (Psych Patients Only)  Admission Status Voluntary/72 hour document signed  Psychosocial Assessment  Patient Complaints Anxiety;Depression;Worrying  Eye Contact Fair  Facial Expression Flat;Sad  Affect Depressed;Sad;Flat  Speech Logical/coherent  Interaction Assertive  Motor Activity Slow  Appearance/Hygiene Unremarkable  Behavior Characteristics Cooperative;Appropriate to situation  Mood Depressed;Sad  Thought Process  Coherency WDL  Content WDL  Delusions None reported or observed  Perception WDL  Hallucination None reported or observed  Judgment Limited  Confusion None  Danger to Self  Current suicidal ideation? Denies  Agreement Not to Harm Self Yes  Description of Agreement verbally contracts for safety  Danger to Others  Danger to Others None reported or observed

## 2023-04-01 NOTE — Progress Notes (Signed)
   04/01/23 0800  Psych Admission Type (Psych Patients Only)  Admission Status Voluntary  Psychosocial Assessment  Patient Complaints Depression;Sadness  Eye Contact Fair  Facial Expression Flat;Anxious;Sad  Affect Depressed;Sad  Speech Logical/coherent  Interaction Assertive  Motor Activity Other (Comment) (wnl)  Appearance/Hygiene Unremarkable  Behavior Characteristics Cooperative;Appropriate to situation  Mood Depressed;Sad  Thought Process  Coherency WDL  Content WDL  Delusions None reported or observed  Perception WDL  Hallucination None reported or observed  Judgment Limited  Confusion None  Danger to Self  Current suicidal ideation? Denies  Agreement Not to Harm Self Yes  Description of Agreement verbal  Danger to Others  Danger to Others None reported or observed

## 2023-04-01 NOTE — [facility] Group Notes (Signed)
[facility] Group Notes:  (Nursing/MHT/Case Management/Adjunct)  Date:  04/01/2023  Time:  9:15 PM  Type of Therapy:   Wrap-up group  Participation Level:  Active  Participation Quality:  Appropriate  Affect:  Appropriate  Cognitive:  Appropriate  Insight:  Appropriate  Engagement in Group:  Engaged  Modes of Intervention:  Education  Summary of Progress/Problems: Pt goal to identify triggers. Rated day 5/10.    Melissa [name] 04/01/2023, 9:15 PM

## 2023-04-02 DIAGNOSIS — F332 Major depressive disorder, recurrent severe without psychotic features: Secondary | ICD-10-CM

## 2023-04-02 MED ORDER — PALIPERIDONE PALMITATE ER 156 MG/ML IM SUSY
156.0000 mg | PREFILLED_SYRINGE | Freq: Once | INTRAMUSCULAR | Status: AC
  Administered 2023-04-02: 156 mg via INTRAMUSCULAR
  Filled 2023-04-02: qty 1

## 2023-04-02 NOTE — Progress Notes (Signed)
   04/02/23 0555  15 Minute Checks  Location Bedroom  Visual Appearance Calm  Behavior Sleeping  Sleep (Behavioral Health Patients Only)  Calculate sleep? (Click Yes once per 24 hr at 0600 safety check) Yes  Documented sleep last 24 hours 8.75

## 2023-04-02 NOTE — Plan of Care (Signed)
°  Problem: Education: °Goal: Emotional status will improve °Outcome: Progressing °Goal: Mental status will improve °Outcome: Progressing °  °Problem: Activity: °Goal: Interest or engagement in activities will improve °Outcome: Progressing °  °

## 2023-04-02 NOTE — Progress Notes (Signed)
   04/02/23 2148  Psych Admission Type (Psych Patients Only)  Admission Status Voluntary  Psychosocial Assessment  Patient Complaints Anxiety;Depression  Eye Contact Fair  Facial Expression Flat  Affect Depressed;Anxious  Speech Logical/coherent  Interaction Assertive  Motor Activity Slow  Appearance/Hygiene Unremarkable  Behavior Characteristics Cooperative;Appropriate to situation  Mood Depressed;Sad  Thought Process  Coherency WDL  Content WDL  Delusions None reported or observed  Perception WDL  Hallucination None reported or observed  Judgment Limited  Confusion None  Danger to Self  Current suicidal ideation? Denies  Agreement Not to Harm Self Yes  Description of Agreement verbal  Danger to Others  Danger to Others None reported or observed

## 2023-04-02 NOTE — Progress Notes (Signed)
   04/02/23 0800  Psych Admission Type (Psych Patients Only)  Admission Status Voluntary  Psychosocial Assessment  Patient Complaints Anxiety;Crying spells;Depression  Eye Contact Fair  Facial Expression Flat  Affect Anxious;Depressed  Speech Logical/coherent  Interaction Assertive  Motor Activity Slow  Appearance/Hygiene Unremarkable  Behavior Characteristics Cooperative;Appropriate to situation  Mood Depressed;Sad  Thought Process  Coherency WDL  Content WDL  Delusions None reported or observed  Perception WDL  Hallucination None reported or observed  Judgment Limited  Confusion None  Danger to Self  Current suicidal ideation? Denies  Agreement Not to Harm Self Yes  Description of Agreement Verbally  Danger to Others  Danger to Others None reported or observed

## 2023-04-02 NOTE — Group Note (Signed)
Occupational Therapy Group Note  Group Topic: Sleep Hygiene  Group Date: 04/02/2023 Start Time: 1430 End Time: 1505 Facilitators: Ted Mcalpine, OT   Group Description: Group encouraged increased participation and engagement through topic focused on sleep hygiene. Patients reflected on the quality of sleep they typically receive and identified areas that need improvement. Group was given background information on sleep and sleep hygiene, including common sleep disorders. Group members also received information on how to improve one's sleep and introduced a sleep diary as a tool that can be utilized to track sleep quality over a length of time. Group session ended with patients identifying one or more strategies they could utilize or implement into their sleep routine in order to improve overall sleep quality.        Therapeutic Goal(s):  Identify one or more strategies to improve overall sleep hygiene  Identify one or more areas of sleep that are negatively impacted (sleep too much, too little, etc)     Participation Level: Engaged   Participation Quality: Independent   Behavior: Appropriate   Speech/Thought Process: Relevant   Affect/Mood: Appropriate   Insight: Fair   Judgement: Fair      Modes of Intervention: Education  Patient Response to Interventions:  Attentive   Plan: Continue to engage patient in OT groups 2 - 3x/week.  04/02/2023  Ted Mcalpine, OT  Kerrin Champagne, OT

## 2023-04-02 NOTE — BHH Group Notes (Signed)
Patient attended and participated in the AA group.

## 2023-04-02 NOTE — Progress Notes (Signed)
[facility] MD Progress Note  04/02/2023 2:11 PM Melissa [name]  MRN:  [id number] Subjective:   Melissa [name] is a 22 yr old female who presented on 8/8 to [facility] with complaints of worsening depression and SI with a plan (OD or jump from bridge), she was admitted to [facility] on 8/9.  PPHx is significant for Bipolar Disorder, GAD, and depression, and 1 Suicide Attempt (OD- 2015) and 3 Prior Psychiatric Hospitalizations (last- [facility] 10/2022), and no history of Self Injurious Behavior.   Case was discussed in the multidisciplinary team. MAR was reviewed and patient was compliant with medications except her Nicotine patch.  She received PRN Tylenol and Hydroxyzine yesterday.   Psychiatric Team made the following recommendations yesterday: -Stop Abilify -Start Risperdal 1 mg BID for mood stability     On interview today patient reports she slept good last night.  She reports her appetite is doing good.  She reports no SI, HI, or AVH.  She reports no Paranoia or Ideas of Reference.  She reports no issues with her medications.  Discussed obtaining the LAI [place] today and she is agreeable with this.  Discussed with her that she would need to continue with oral medication as a bridge for 2 weeks during this first time and she reported understanding and was agreeable to this.  She reports that she will continue treatment with her outpatient providers.  She reports no other concerns present.  Principal Problem: Bipolar 1 disorder, mixed ([name]) Diagnosis: Principal Problem:   Bipolar 1 disorder, mixed ([name]) Active Problems:   GAD (generalized anxiety disorder)   MDD (major depressive disorder), recurrent severe, without psychosis ([name])  Total Time spent with patient:  I personally spent 35 minutes on the unit in direct patient care. The direct patient care time included face-to-face time with the patient, reviewing the patient's chart, communicating with other professionals, and  coordinating care. Greater than 50% of this time was spent in counseling or coordinating care with the patient regarding goals of hospitalization, psycho-education, and discharge planning needs.   Past Psychiatric History: Bipolar Disorder, GAD, and depression, and 1 Suicide Attempt (OD- 2015) and 3 Prior Psychiatric Hospitalizations (last- [facility] 10/2022), and no history of Self Injurious Behavior.  Past Medical History:  Past Medical History:  Diagnosis Date   Anemia    Anxiety    Asthma    inhaler. 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   Seizures ([name])    last one in 2015 - on meds   Sickle cell trait ([name])    TBI (traumatic brain injury) ([name]) 2006    Past Surgical History:  Procedure Laterality Date   APPENDECTOMY     CESAREAN SECTION N/A 08/31/2021   Procedure: CESAREAN SECTION;  Surgeon: Kathrynn Running, MD;  Location: [facility] LD ORS;  Service: Obstetrics;  Laterality: N/A;   CESAREAN SECTION N/A 06/05/2022   Procedure: CESAREAN SECTION;  Surgeon: Hermina Staggers, MD;  Location: [facility] ORS;  Service: Obstetrics;  Laterality: N/A;   HERNIA REPAIR     INTESTINAL MALROTATION REPAIR  2001   Family History:  Family History  Problem Relation Age of Onset   Depression Mother    Stroke Mother    Obesity Mother    Post-traumatic stress disorder Mother    Anxiety disorder Mother    Hypertension Mother    Diabetes Mother    Schizophrenia Father    Kidney disease Father     Family Psychiatric  History:  Mother- Depression, Anxiety, PTSD Grandmother- Bipolar Disorder Sister- Bipolar Disorder Maternal Family- multiple members EtOH Abuse No Known Suicides  Social History:  Social History   Substance and Sexual Activity  Alcohol Use Not Currently   Comment: occasionally, prior to pregnancy     Social History   Substance and Sexual Activity  Drug Use Not Currently   Types: Marijuana   Comment: last 2020    Social History   Socioeconomic History   Marital status: Single    Spouse name: Not on file   Number of children: Not on file   Years of education: Not on file   Highest education level: Not on file  Occupational History   Not on file  Tobacco Use   Smoking status: Every Day    Current packs/day: 0.25    Types: Cigarettes   Smokeless tobacco: Never   Tobacco comments:    black and milds, 2 cigs daily  Vaping Use   Vaping status: Former  Substance and Sexual Activity   Alcohol use: Not Currently    Comment: occasionally, prior to pregnancy   Drug use: Not Currently    Types: Marijuana    Comment: last 2020   Sexual activity: Not Currently    Partners: Male    Birth control/protection: None  Other Topics Concern   Not on file  Social History Narrative   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   She attended [employer].    She lives with her parents, siblings, and grandfather.    She enjoys writing, singing, dancing, cooking, and shopping.   She attends [employer].   Social Determinants of Health   Financial Resource Strain: Not on file  Food Insecurity: No Food Insecurity (03/29/2023)   Hunger Vital Sign    Worried About Running Out of Food in the Last Year: Never true    Ran Out of Food in the Last Year: Never true  Transportation Needs: No Transportation Needs (03/29/2023)   PRAPARE - Administrator, Civil Service (Medical): No    Lack of Transportation (Non-Medical): No  Physical Activity: Not on file   Stress: Stress Concern Present (08/08/2022)   Received from [facility]   [facility] - Occupational Stress Questionnaire    Feeling of Stress : To some extent  Social Connections: Unknown (01/03/2022)   Received from [facility], [facility]   Social Network    Social Network: Not on file   Additional Social History:                         Sleep: Good  Appetite:  Good  Current Medications: Current Facility-Administered Medications  Medication Dose Route Frequency Provider Last Rate Last Admin   acetaminophen (TYLENOL) tablet 650 mg  650 mg Oral Q6H PRN Lenox Ponds, NP   650 mg at 04/01/23 2136   alum & mag hydroxide-simeth (MAALOX/MYLANTA) 200-200-20  MG/5ML suspension 30 mL  30 mL Oral Q4H PRN Lenox Ponds, NP       busPIRone (BUSPAR) tablet 10 mg  10 mg Oral TID Abbott Pao, Nadir, MD   10 mg at 04/02/23 1253   doxycycline (VIBRA-TABS) tablet 100 mg  100 mg Oral Q12H Attiah, Nadir, MD   100 mg at 04/02/23 0843   hydrOXYzine (ATARAX) tablet 50 mg  50 mg Oral TID PRN Lenox Ponds, NP   50 mg at 04/01/23 1205   magnesium hydroxide (MILK OF MAGNESIA) suspension 30 mL  30 mL Oral Daily PRN Lenox Ponds, NP       metroNIDAZOLE (FLAGYL) tablet 500 mg  500 mg Oral Q12H Ravi, Himabindu, MD   500 mg at 04/02/23 0840   nicotine (NICODERM CQ - dosed in mg/24 hours) patch 14 mg  14 mg Transdermal Q0600 Lenox Ponds, NP       risperiDONE (RISPERDAL) tablet 1 mg  1 mg Oral BID Abbott Pao, Nadir, MD   1 mg at 04/02/23 0841   traZODone (DESYREL) tablet 50 mg  50 mg Oral QHS PRN Lenox Ponds, NP       valACYclovir (VALTREX) tablet 1,000 mg  1,000 mg Oral BID Abbott Pao, Nadir, MD   1,000 mg at 04/02/23 0840   ziprasidone (GEODON) injection 20 mg  20 mg Intramuscular Daily PRN Lenox Ponds, NP        Lab Results: No results found for this or any previous visit (from the past 48 hour(s)).  Blood Alcohol level:  Lab  Results  Component Value Date   ETH <10 03/29/2023   ETH <10 11/14/2022    Metabolic Disorder Labs: Lab Results  Component Value Date   HGBA1C 5.2 03/30/2023   MPG 102.54 03/30/2023   MPG 105 11/14/2022   Lab Results  Component Value Date   PROLACTIN 14.4 11/14/2022   PROLACTIN 8.8 01/01/2017   Lab Results  Component Value Date   CHOL 148 03/30/2023   TRIG 69 03/30/2023   HDL 32 (L) 03/30/2023   CHOLHDL 4.6 03/30/2023   VLDL 14 03/30/2023   LDLCALC 102 (H) 03/30/2023   LDLCALC 101 (H) 11/14/2022    Physical Findings: AIMS: Facial and Oral Movements Muscles of Facial Expression: None, normal Lips and Perioral Area: None, normal Jaw: None, normal Tongue: None, normal,Extremity Movements Upper (arms, wrists, hands, fingers): None, normal Lower (legs, knees, ankles, toes): None, normal, Trunk Movements Neck, shoulders, hips: None, normal, Overall Severity Severity of abnormal movements (highest score from questions above): None, normal Incapacitation due to abnormal movements: None, normal Patient's awareness of abnormal movements (rate only patient's report): No Awareness, Dental Status Current problems with teeth and/or dentures?: No Does patient usually wear dentures?: No  CIWA:    COWS:     Musculoskeletal: Strength & Muscle Tone: within normal limits Gait & Station: normal Patient leans: N/A  Psychiatric Specialty Exam:  Presentation  General Appearance:  Appropriate for Environment; Casual  Eye Contact: Good  Speech: Clear and Coherent; Normal Rate  Speech Volume: Normal  Handedness: Right   Mood and Affect  Mood: -- ("ok")  Affect: Congruent   Thought Process  Thought Processes: Coherent; Goal Directed  Descriptions of Associations:Intact  Orientation:Full (Time, Place and Person)  Thought Content:Logical; WDL  History of Schizophrenia/Schizoaffective disorder:No data recorded Duration of Psychotic Symptoms:No data  recorded Hallucinations:Hallucinations: None  Ideas of Reference:None  Suicidal Thoughts:Suicidal Thoughts: No  Homicidal Thoughts:Homicidal Thoughts: No   Sensorium  Memory:  Immediate Fair; Recent Fair  Judgment: Fair  Insight: Fair   Art therapist  Concentration: Good  Attention Span: Good  Recall: Dudley Major of Knowledge: Good  Language: Good   Psychomotor Activity  Psychomotor Activity:Psychomotor Activity: Normal   Assets  Assets: Communication Skills; Desire for Improvement; Physical Health; Resilience   Sleep  Sleep:Sleep: Good Number of Hours of Sleep: 8.75    Physical Exam: Physical Exam Vitals and nursing note reviewed.  Constitutional:      General: She is not in acute distress.    Appearance: Normal appearance. She is normal weight. She is not ill-appearing or toxic-appearing.  HENT:     Head: Normocephalic and atraumatic.  Pulmonary:     Effort: Pulmonary effort is normal.  Musculoskeletal:        General: Normal range of motion.  Neurological:     General: No focal deficit present.     Mental Status: She is alert.    Review of Systems  Respiratory:  Negative for cough and shortness of breath.   Cardiovascular:  Negative for chest pain.  Gastrointestinal:  Negative for abdominal pain, constipation, diarrhea, nausea and vomiting.  Neurological:  Negative for dizziness, weakness and headaches.  Psychiatric/Behavioral:  Negative for depression, hallucinations and suicidal ideas. The patient is not nervous/anxious.    Blood pressure 117/65, pulse 87, temperature 98.4 F (36.9 C), temperature source Oral, resp. rate 16, height 5\' 7"  (1.702 m), weight 108 kg, SpO2 100%, unknown if currently breastfeeding. Body mass index is 37.29 kg/m.   Treatment Plan Summary: Daily contact with patient to assess and evaluate symptoms and progress in treatment and Medication management  Margene Depaola Kellam-Wallace is a 22 yr old female who  presented on 8/8 to [facility] with complaints of worsening depression and SI with a plan (OD or jump from bridge), she was admitted to [facility] on 8/9.  PPHx is significant for Bipolar Disorder, GAD, and depression, and 1 Suicide Attempt (OD- 2015) and 3 Prior Psychiatric Hospitalizations (last- [facility] 10/2022), and no history of Self Injurious Behavior.   Burnette has been tolerating the Risperdal well without any side effects.  She is agreeable to getting the [place] injection today and so this will be ordered.  She signed a 72-hour notice on 8/10 at 4:30 PM.  At this time there is no behavior that would warrant an IVC and she is agreeable with treatment and also agreeable with outpatient treatment., so we will plan for discharge tomorrow.  We will not make any other changes to her medication at this time.  We will continue to monitor.   Bipolar Disorder, Mixed  GAD: -Continue Risperdal 1 mg BID for mood stability -Invega Sustenna 156 mg ordered -Continue Buspar 10 mg TID for anxiety -Continue Agitation Protocol: Geodon   STD: -Continue Valacyclovir 1000 mg  -Continue Metronidazole 500 mg q12 for 14 doses -Continue Doxycycline 100 mg q12 for 14 doses   Nicotine Dependence: -Continue Nicotine Patch 14 mg daily   -Continue PRN's: Tylenol, Maalox, Atarax, Milk of Magnesia, Trazodone   Lauro Franklin, MD 04/02/2023, 2:11 PM

## 2023-04-02 NOTE — BHH Suicide Risk Assessment (Signed)
B[facility] INPATIENT:  Family/Significant Other Suicide Prevention Education  Suicide Prevention Education:  Melissa [name] (Mom) [phone] Refusal to Support Patient after Discharge:  Suicide Prevention Education Not Provided:  Patient has identified home of a friend as the place the patient will be residing after discharge.  With written consent of the patient, two attempts were made to provide Suicide Prevention Education to [employer] (Mom) [phone].This person indicates she will not be responsible for the patient after discharge.   S  04/02/2023,11:36 AM

## 2023-04-02 NOTE — BHH Group Notes (Signed)
Spiritual care group facilitated by Chaplain Dyanne Carrel, [facility]  Group focused on topic of strength. Group members reflected on what thoughts and feelings emerge when they hear this topic. They then engaged in facilitated dialog around how strength is present in their lives. This dialog focused on representing what strength had been to them in their lives (images and patterns given) and what they saw as helpful in their life now (what they needed / wanted).  Activity drew on narrative framework.  Patient Progress: Melissa [name] attended group and actively engaged and participated in group conversation and activities.  She was also supportive of peers.
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
## 2023-04-03 DIAGNOSIS — F3160 Bipolar disorder, current episode mixed, unspecified: Secondary | ICD-10-CM | POA: Diagnosis not present

## 2023-04-03 MED ORDER — DOXYCYCLINE HYCLATE 100 MG PO TABS: 100.0000 mg | ORAL_TABLET | Freq: Two times a day (BID) | ORAL | 0 refills | Status: DC

## 2023-04-03 MED ORDER — BUSPIRONE HCL 10 MG PO TABS: 10.0000 mg | ORAL_TABLET | Freq: Three times a day (TID) | ORAL | 0 refills | Status: DC

## 2023-04-03 MED ORDER — NICOTINE 14 MG/24HR TD PT24: 14.0000 mg | MEDICATED_PATCH | Freq: Every day | TRANSDERMAL | 0 refills | Status: DC

## 2023-04-03 MED ORDER — METRONIDAZOLE 500 MG PO TABS: 500.0000 mg | ORAL_TABLET | Freq: Two times a day (BID) | ORAL | 0 refills | Status: DC

## 2023-04-03 NOTE — BHH Suicide Risk Assessment (Signed)
Suicide Risk Assessment  Discharge Assessment    [facility] Discharge Suicide Risk Assessment   Principal Problem: Bipolar 1 disorder, mixed ([name]) Discharge Diagnoses: Principal Problem:   Bipolar 1 disorder, mixed ([name]) Active Problems:   GAD (generalized anxiety disorder)   MDD (major depressive disorder), recurrent severe, without psychosis ([name])  During the patient's hospitalization, patient had extensive initial psychiatric evaluation, and follow-up psychiatric evaluations every day.  Psychiatric diagnoses provided upon initial assessment: Initially diagnosed with MDD, Severe, w/out Psychosis but this was changed to Bipolar Disorder 1 Mixed. GAD  Patient's psychiatric medications were adjusted on admission: Her Lexapro was titrated along with her Buspar.  She was continued on her Risperdal.  She was continued on her Doxycycline and Metronidazole.  During the hospitalization, other adjustments were made to the patient's psychiatric medication regimen: Her Lexapro was stopped due to concerns for triggering mania.  She was given Gean Birchwood on 8/12.  Gradually, patient started adjusting to milieu.   Patient's care was discussed during the interdisciplinary team meeting every day during the hospitalization.  The patient is not having side effects to prescribed psychiatric medication.  The patient reports their target psychiatric symptoms of depression and SI responded well to the psychiatric medications, and the patient reports overall benefit other psychiatric hospitalization. Supportive psychotherapy was provided to the patient. The patient also participated in regular group therapy while admitted.   Labs were reviewed with the patient, and abnormal results were discussed with the patient.  The patient denied having suicidal thoughts more than 48 hours prior to discharge.  Patient denies having homicidal thoughts.  Patient denies having auditory hallucinations.  Patient denies any  visual hallucinations.  Patient denies having paranoid thoughts.  The patient is able to verbalize their individual safety plan to this provider.  It is recommended to the patient to continue psychiatric medications as prescribed, after discharge from the hospital.    It is recommended to the patient to follow up with your outpatient psychiatric provider and PCP.  Discussed with the patient, the impact of alcohol, drugs, tobacco have been there overall psychiatric and medical wellbeing, and total abstinence from substance use was recommended the patient.  Total Time spent with patient: 20 minutes  Musculoskeletal: Strength & Muscle Tone: within normal limits Gait & Station: normal Patient leans: N/A  Psychiatric Specialty Exam  Presentation  General Appearance:  Appropriate for Environment; Casual; Fairly Groomed  Eye Contact: Good  Speech: Normal Rate; Clear and Coherent  Speech Volume: Normal  Handedness: Right   Mood and Affect  Mood: Euthymic  Duration of Depression Symptoms: Greater than two weeks  Affect: Appropriate; Congruent; Full Range   Thought Process  Thought Processes: Linear  Descriptions of Associations:Intact  Orientation:Full (Time, Place and Person)  Thought Content:Logical  History of Schizophrenia/Schizoaffective disorder:No data recorded Duration of Psychotic Symptoms:No data recorded Hallucinations:Hallucinations: None  Ideas of Reference:None  Suicidal Thoughts:Suicidal Thoughts: No  Homicidal Thoughts:Homicidal Thoughts: No   Sensorium  Memory: Immediate Good; Recent Good; Remote Good  Judgment: Fair  Insight: Fair   Art therapist  Concentration: Fair  Attention Span: Fair  Recall: Good  Fund of Knowledge: Good  Language: Good   Psychomotor Activity  Psychomotor Activity: Psychomotor Activity: Normal   Assets  Assets: Communication Skills; Desire for Improvement; Physical Health;  Resilience   Sleep  Sleep: Sleep: Fair Number of Hours of Sleep: 8.75   Physical Exam: Physical Exam Vitals and nursing note reviewed.  Constitutional:      General: She is  not in acute distress.    Appearance: Normal appearance. She is normal weight. She is not ill-appearing or toxic-appearing.  HENT:     Head: Normocephalic and atraumatic.  Pulmonary:     Effort: Pulmonary effort is normal.  Musculoskeletal:        General: Normal range of motion.  Neurological:     General: No focal deficit present.     Mental Status: She is alert.    Review of Systems  Respiratory:  Negative for cough and shortness of breath.   Cardiovascular:  Negative for chest pain.  Gastrointestinal:  Negative for abdominal pain, constipation, diarrhea, nausea and vomiting.  Neurological:  Negative for dizziness, weakness and headaches.  Psychiatric/Behavioral:  Negative for depression, hallucinations and suicidal ideas. The patient is not nervous/anxious.    Blood pressure 118/76, pulse 89, temperature 98.1 F (36.7 C), temperature source Oral, resp. rate 16, height 5\' 7"  (1.702 m), weight 108 kg, SpO2 100%, unknown if currently breastfeeding. Body mass index is 37.29 kg/m.  Mental Status Per Nursing Assessment::   On Admission:  Suicidal ideation indicated by patient  Demographic Factors:  Adolescent or young adult  Loss Factors: Loss of significant relationship  Historical Factors: Prior suicide attempts, Family history of mental illness or substance abuse, and Impulsivity  Risk Reduction Factors:   Sense of responsibility to family and Positive social support  Continued Clinical Symptoms:  None  Cognitive Features That Contribute To Risk:  None    Suicide Risk:  Mild:  No Suicidal ideation or thoughts of Self Harm.  There are no identifiable plans, no associated intent, mild dysphoria and related symptoms, good self-control (both objective and subjective assessment), few other  risk factors, and identifiable protective factors, including available and accessible social support.  However, due to history of Prior Suicide Attempts there is some risk present.   Follow-up Information     [facility] Follow up.   Specialty: Behavioral Health Why: You have an appointment for medication management services on 04/12/23 at 11:30 am.  Therapy services are also available. Contact information: [address] [place] [address] [phone]                Plan Of Care/Follow-up recommendations:  Activity: as tolerated  Diet: heart healthy  Other: -Follow-up with your outpatient psychiatric provider -instructions on appointment date, time, and address (location) are provided to you in discharge paperwork.  -Take your psychiatric medications as prescribed at discharge - instructions are provided to you in the discharge paperwork  -Follow-up with outpatient primary care doctor and other specialists -for management of chronic medical disease, including: Elevated Lipid Panel  -Testing: Follow-up with outpatient provider for abnormal lab results: Elevated Lipid Panel  -Recommend abstinence from alcohol, tobacco, and other illicit drug use at discharge.   -If your psychiatric symptoms recur, worsen, or if you have side effects to your psychiatric medications, call your outpatient psychiatric provider, 911, [phone] or go to the nearest emergency department.  -If suicidal thoughts recur, call your outpatient psychiatric provider, 911, [phone] or go to the nearest emergency department.   Lauro Franklin, MD 04/03/2023, 9:47 AM

## 2023-04-03 NOTE — Progress Notes (Signed)
Patient discharged from [facility] on 04/03/23 at 1315. Patient denies SI, plan, and intention. Suicide safety plan completed, reviewed with this RN, given to the patient, and a copy in the chart. Patient denies HI/AVH upon discharge. Patient is alert, oriented, and cooperative. RN provided patient with discharge paperwork and reviewed information with patient. Patient expressed that she understood all of the discharge instructions. Pt was satisfied with belongings returned to her from the locker and at bedside. Discharged patient to [facility] waiting room.

## 2023-04-03 NOTE — Discharge Instructions (Signed)

## 2023-04-03 NOTE — Progress Notes (Signed)
   04/03/23 0528  15 Minute Checks  Location Bedroom  Visual Appearance Calm  Behavior Sleeping  Sleep (Behavioral Health Patients Only)  Calculate sleep? (Click Yes once per 24 hr at 0600 safety check) Yes  Documented sleep last 24 hours 8

## 2023-04-03 NOTE — Progress Notes (Signed)
   04/03/23 0900  Psych Admission Type (Psych Patients Only)  Admission Status Voluntary  Psychosocial Assessment  Patient Complaints Anxiety  Eye Contact Fair  Facial Expression Flat  Affect Anxious  Speech Logical/coherent  Interaction Assertive  Motor Activity Slow  Appearance/Hygiene Unremarkable  Behavior Characteristics Cooperative;Appropriate to situation  Mood Anxious;Pleasant  Thought Process  Coherency WDL  Content WDL  Delusions None reported or observed  Perception WDL  Hallucination None reported or observed  Judgment Impaired  Confusion None  Danger to Self  Current suicidal ideation? Denies  Agreement Not to Harm Self Yes  Description of Agreement Verbal  Danger to Others  Danger to Others None reported or observed

## 2023-04-03 NOTE — Group Note (Signed)
Recreation Therapy Group Note   Group Topic:Animal Assisted Therapy   Group Date: 04/03/2023 Start Time: 1000 End Time: 1030 Facilitators: , Benito Mccreedy, LRT Location: [address] Dayroom  Animal-Assisted Activity (AAA) Program Checklist/Progress Note Patient Eligibility Criteria Checklist & Daily Group note for Rec Tx Intervention   AAA/T Program Assumption of Risk Form signed by Patient/ or Parent Legal Guardian YES  Patient is free of allergies or severe asthma  YES  Patient reports no fear of animals YES  Patient reports no history of cruelty to animals YES  Patient understands their participation is voluntary YES  Patient washes hands before animal contact YES  Patient washes hands after animal contact YES   Group Description: Patients provided opportunity to interact with trained and credentialed [employer] Therapy dog and the community volunteer/dog handler. Patients practiced appropriate animal interaction and were educated on dog safety outside of the hospital in common community settings. Patients were allowed to use dog toys and other items to practice commands, engage the dog in play, and/or complete routine aspects of animal care.   Education: Charity fundraiser, Health visitor, Communication & Social Skills   Affect/Mood: Congruent and Euthymic   Participation Level: Engaged   Participation Quality: Independent   Behavior: Appropriate, Attentive , Cooperative, and Interactive    Modes of Intervention: Activity, Teaching laboratory technician, and Socialization   Patient Response to Interventions:  Interested    Education Outcome:  Acknowledges education   Clinical Observations/Individualized Feedback: Public relations account executive appropriately pet the visiting therapy dog, Bella throughout group. Pt was pleasant and interactive with peers and Teaching laboratory technician, asking questions and sharing stories about personal experiences with animals.  Pt left group early endorsing  feeling tired and wanting to sleep.   Benito Mccreedy , LRT, CTRS 04/03/2023 2:35 PM

## 2023-04-03 NOTE — Progress Notes (Signed)
  [facility] Adult Case Management Discharge Plan :  Will you be returning to the same living situation after discharge:  Yes,  Grandfather At discharge, do you have transportation home?: Yes,  Grandmother Do you have the ability to pay for your medications: Yes,  Insured  Release of information consent forms completed and in the chart;  Patient's signature needed at discharge.  Patient to Follow up at:  Follow-up Information     [facility] Follow up.   Specialty: Behavioral Health Why: You have an appointment for medication management services on 04/12/23 at 11:30 am.  Therapy services are also available. Contact information: [phone] [place] [address] [phone]                Next level of care provider has access to [facility] Link:yes  Safety Planning and Suicide Prevention discussed: Yes,  Marciano Sequin (Mom) [phone]     Has patient been referred to the Quitline?: Patient refused referral for treatment  Patient has been referred for addiction treatment: No known substance use disorder. Patient to continue working towards treatment goals after discharge. Patient no longer meets criteria for inpatient criteria per attending physician. Continue taking medications as prescribed, nursing to provide instructions at discharge. Follow up with all scheduled appointments.    S , LCSW 04/03/2023, 9:18 AM

## 2023-04-03 NOTE — Discharge Summary (Signed)
Physician Discharge Summary Note  Patient:  Melissa [name] is an 22 y.o., female MRN:  [id number] DOB:  10-Jul-2001 Patient phone:  [phone] (home)  Patient address:   [address] [place] [address],  Total Time spent with patient: 20 minutes  Date of Admission:  03/29/2023 Date of Discharge: 04/03/2023  Reason for Admission:   Melissa [name] is a 21 yr old female who presented on 8/8 to [facility] with complaints of worsening depression and SI with a plan (OD or jump from bridge), she was admitted to [facility] on 8/9.  PPHx is significant for Bipolar Disorder, GAD, and depression, and 1 Suicide Attempt (OD- 2015) and 3 Prior Psychiatric Hospitalizations (last- [facility] 10/2022), and no history of Self Injurious Behavior.   According to outside records, the patient presented to the emergency room reporting suicidal thoughts for 1 week with desire to jump off a bridge or overdose on prescribed medications   Initial assessment on 8/9, patient was evaluated on the inpatient unit, the patient reports has no recollection of how she ended up in the emergency room but tells me that she has been only drinking alcohol socially but recalls drinking at least 4 shots of rum prior to going to emergency room and had a phone conversation with one of her kids father who reported to her later on that she told him that she is feeling suicidal and she noted to him that she does not want to live anymore she does not recall seeing in the emergency room that she wanted to jump off a bridge, primarily she seems to be minimizing depression symptoms but with further discussion she admits to multiple stressors including possibility of losing custody of her children to her parents related to recent admission in April to this facility and her requiring mental health help also notes a stress related to arguments with her kids fathers and not seeing her children as frequent as she wants secondary to CPS being  involved in the case.  She was here to assist facility in April and at that time she reported thoughts to harm herself as well as her children which started the process of CPS getting involved.  With further encouragement she does admit to some increased depression and anxiety lately secondary to stressors with occasions of interrupted sleep, reports increased appetite but fair energy level and interest, she denies feeling hopeless or helpless but questionably minimizing symptoms she denies passive or active SI and adamantly confirms that last active SI or HI was back in April 2024 with none since then.  She denies AVH or paranoia or other delusions, she denies symptoms consistent with mania hypomania or PTSD.  She tells me that since she was started on psychotropic medications in April prior to her discharge from this facility she has been doing better in regard to her mood but agrees with medication changes as noted below.  With further discussion she does admit to some increased depression and anxiety with self-medicating with drinking alcohol.   Principal Problem: Bipolar 1 disorder, mixed ([name]) Discharge Diagnoses: Principal Problem:   Bipolar 1 disorder, mixed ([name]) Active Problems:   GAD (generalized anxiety disorder)   MDD (major depressive disorder), recurrent severe, without psychosis ([name])   Past Psychiatric History: Bipolar Disorder, GAD, and depression, and 1 Suicide Attempt (OD- 2015) and 3 Prior Psychiatric Hospitalizations (last- [facility] 10/2022), and no history of Self Injurious Behavior.   Past Medical History:  Past Medical History:  Diagnosis Date  Anemia    Anxiety    Asthma    inhaler. 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/17/2021   Seizures ([name])    last one in 2015 - on meds   Sickle cell trait ([name])    TBI (traumatic brain injury) ([name]) 2006    Past Surgical History:  Procedure Laterality Date   APPENDECTOMY     CESAREAN SECTION N/A 08/31/2021   Procedure: CESAREAN SECTION;  Surgeon: Kathrynn Running, MD;  Location: [facility] LD ORS;  Service: Obstetrics;  Laterality: N/A;   CESAREAN SECTION N/A 06/05/2022   Procedure: CESAREAN SECTION;  Surgeon: Hermina Staggers, MD;  Location: [facility] LD ORS;  Service: Obstetrics;  Laterality: N/A;   HERNIA REPAIR     INTESTINAL MALROTATION REPAIR  2001   Family History:  Family History  Problem Relation Age of Onset   Depression Mother    Stroke Mother    Obesity Mother    Post-traumatic stress disorder Mother    Anxiety disorder Mother    Hypertension Mother    Diabetes Mother    Schizophrenia Father    Kidney disease Father    Family Psychiatric  History:  Mother- Depression, Anxiety, PTSD Grandmother- Bipolar Disorder Sister- Bipolar Disorder Maternal Family- multiple members EtOH Abuse No Known Suicides Social History:  Social History   Substance and Sexual Activity  Alcohol Use Not Currently   Comment: occasionally, prior to pregnancy     Social History   Substance and Sexual Activity  Drug Use Not Currently   Types: Marijuana   Comment: last 2020    Social History   Socioeconomic History   Marital status: Single    Spouse name: Not on file   Number of children: Not on file   Years of education: Not on file   Highest education level: Not on file  Occupational History   Not on file  Tobacco Use   Smoking status: Every Day    Current packs/day: 0.25    Types: Cigarettes   Smokeless  tobacco: Never   Tobacco comments:    black and milds, 2 cigs daily  Vaping Use   Vaping status: Former  Substance and Sexual Activity   Alcohol use: Not Currently    Comment: occasionally, prior to pregnancy   Drug use: Not Currently    Types: Marijuana    Comment: last 2020   Sexual activity: Not Currently    Partners: Male    Birth control/protection: None  Other Topics Concern   Not on file  Social History Narrative   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   She attended [employer].    She lives with her parents, siblings, and grandfather.    She enjoys writing, singing, dancing, cooking, and shopping.   She attends [employer].   Social Determinants of Health   Financial Resource Strain: Not on file  Food Insecurity: No Food Insecurity (03/29/2023)   Hunger Vital Sign  Worried About Programme researcher, broadcasting/film/video in the Last Year: Never true    Ran Out of Food in the Last Year: Never true  Transportation Needs: No Transportation Needs (03/29/2023)   PRAPARE - Administrator, Civil Service (Medical): No    Lack of Transportation (Non-Medical): No  Physical Activity: Not on file  Stress: Stress Concern Present (08/08/2022)   Received from [facility], [facility]   [facility] - Occupational Stress Questionnaire    Feeling of Stress : To some extent  Social Connections: Unknown (01/03/2022)   Received [facility], [facility]   Social Network    Social Network: Not on file    Hospital Course:   During the patient's hospitalization, patient had extensive initial psychiatric evaluation, and follow-up psychiatric evaluations every day.  Psychiatric diagnoses provided upon initial assessment: Initially diagnosed with MDD, Severe, w/out Psychosis but this was changed to Bipolar Disorder 1 Mixed. GAD   Patient's psychiatric medications were adjusted on admission: Her Lexapro was titrated along with her Buspar.  She was  continued on her Risperdal.  She was continued on her Doxycycline and Metronidazole.   During the hospitalization, other adjustments were made to the patient's psychiatric medication regimen: Her Lexapro was stopped due to concerns for triggering mania. She was given Gean Birchwood on 8/12.   Patient's care was discussed during the interdisciplinary team meeting every day during the hospitalization.  The patient is not having side effects to prescribed psychiatric medication.  Gradually, patient started adjusting to milieu. The patient was evaluated each day by a clinical provider to ascertain response to treatment. Improvement was noted by the patient's report of decreasing symptoms, improved sleep and appetite, affect, medication tolerance, behavior, and participation in unit programming.  Patient was asked each day to complete a self inventory noting mood, mental status, pain, new symptoms, anxiety and concerns.   Symptoms were reported as significantly decreased or resolved completely by discharge.  The patient reports that their mood is stable.  The patient denied having suicidal thoughts for more than 48 hours prior to discharge.  Patient denies having homicidal thoughts.  Patient denies having auditory hallucinations.  Patient denies any visual hallucinations or other symptoms of psychosis.  The patient was motivated to continue taking medication with a goal of continued improvement in mental health.   The patient reports their target psychiatric symptoms of depression and SI responded well to the psychiatric medications, and the patient reports overall benefit other psychiatric hospitalization. Supportive psychotherapy was provided to the patient. The patient also participated in regular group therapy while hospitalized. Coping skills, problem solving as well as relaxation therapies were also part of the unit programming.  Labs were reviewed with the patient, and abnormal results were  discussed with the patient.  The patient is able to verbalize their individual safety plan to this provider.  # It is recommended to the patient to continue psychiatric medications as prescribed, after discharge from the hospital.    # It is recommended to the patient to follow up with your outpatient psychiatric provider and PCP.  # It was discussed with the patient, the impact of alcohol, drugs, tobacco have been there overall psychiatric and medical wellbeing, and total abstinence from substance use was recommended the patient.ed.  # Prescriptions provided or sent directly to preferred pharmacy at discharge. Patient agreeable to plan. Given opportunity to ask questions. Appears to feel comfortable with discharge.    # In  the event of worsening symptoms, the patient is instructed to call the crisis hotline, 911 and or go to the nearest ED for appropriate evaluation and treatment of symptoms. To follow-up with primary care provider for other medical issues, concerns and or health care needs  # Patient was discharged home with her grandmother with a plan to follow up as noted below.    On day of discharge she reports that she is feeling good and just has a little bit of soreness at the injection site.  She reports that this is not a major issue and she has already asked for an as needed Tylenol and expects it to improve soon.  Discussed with her that she has follow-up appointment scheduled and encouraged her to attend these.  She reports no SI, HI, or AVH.  She reports her sleep is good.  She reports her appetite is doing good.  She reports no other concerns present.  She was discharged home with her grandmother.   Physical Findings: AIMS: Facial and Oral Movements Muscles of Facial Expression: None, normal Lips and Perioral Area: None, normal Jaw: None, normal Tongue: None, normal,Extremity Movements Upper (arms, wrists, hands, fingers): None, normal Lower (legs, knees, ankles, toes):  None, normal, Trunk Movements Neck, shoulders, hips: None, normal, Overall Severity Severity of abnormal movements (highest score from questions above): None, normal Incapacitation due to abnormal movements: None, normal Patient's awareness of abnormal movements (rate only patient's report): No Awareness, Dental Status Current problems with teeth and/or dentures?: No Does patient usually wear dentures?: No  CIWA:    COWS:     Musculoskeletal: Strength & Muscle Tone: within normal limits Gait & Station: normal Patient leans: N/A   Psychiatric Specialty Exam:  Presentation  General Appearance:  Appropriate for Environment; Casual; Fairly Groomed  Eye Contact: Good  Speech: Normal Rate; Clear and Coherent  Speech Volume: Normal  Handedness: Right   Mood and Affect  Mood: Euthymic  Affect: Appropriate; Congruent; Full Range   Thought Process  Thought Processes: Linear  Descriptions of Associations:Intact  Orientation:Full (Time, Place and Person)  Thought Content:Logical  History of Schizophrenia/Schizoaffective disorder:No data recorded Duration of Psychotic Symptoms:No data recorded Hallucinations:Hallucinations: None  Ideas of Reference:None  Suicidal Thoughts:Suicidal Thoughts: No  Homicidal Thoughts:Homicidal Thoughts: No   Sensorium  Memory: Immediate Good; Recent Good; Remote Good  Judgment: Fair  Insight: Fair   Art therapist  Concentration: Fair  Attention Span: Fair  Recall: Good  Fund of Knowledge: Good  Language: Good   Psychomotor Activity  Psychomotor Activity: Psychomotor Activity: Normal   Assets  Assets: Communication Skills; Desire for Improvement; Physical Health; Resilience   Sleep  Sleep: Sleep: Fair Number of Hours of Sleep: 8.75    Physical Exam: Physical Exam Vitals and nursing note reviewed.  Constitutional:      General: She is not in acute distress.    Appearance: Normal  appearance. She is normal weight. She is not ill-appearing or toxic-appearing.  HENT:     Head: Normocephalic and atraumatic.  Pulmonary:     Effort: Pulmonary effort is normal.  Musculoskeletal:        General: Normal range of motion.  Neurological:     General: No focal deficit present.     Mental Status: She is alert.    Review of Systems  Respiratory:  Negative for cough and shortness of breath.   Cardiovascular:  Negative for chest pain.  Gastrointestinal:  Negative for abdominal pain, constipation, diarrhea, nausea and vomiting.  Neurological:  Negative for dizziness, weakness and headaches.  Psychiatric/Behavioral:  Negative for depression, hallucinations and suicidal ideas. The patient is not nervous/anxious.    Blood pressure 118/76, pulse 89, temperature 98.1 F (36.7 C), temperature source Oral, resp. rate 16, height 5\' 7"  (1.702 m), weight 108 kg, SpO2 100%, unknown if currently breastfeeding. Body mass index is 37.29 kg/m.   Social History   Tobacco Use  Smoking Status Every Day   Current packs/day: 0.25   Types: Cigarettes  Smokeless Tobacco Never  Tobacco Comments   black and milds, 2 cigs daily   Tobacco Cessation:  A prescription for an FDA-approved tobacco cessation medication provided at discharge   Blood Alcohol level:  Lab Results  Component Value Date   [facility] <10 03/29/2023   ETH <10 11/14/2022    Metabolic Disorder Labs:  Lab Results  Component Value Date   HGBA1C 5.2 03/30/2023   MPG 102.54 03/30/2023   MPG 105 11/14/2022   Lab Results  Component Value Date   PROLACTIN 14.4 11/14/2022   PROLACTIN 8.8 01/01/2017   Lab Results  Component Value Date   CHOL 148 03/30/2023   TRIG 69 03/30/2023   HDL 32 (L) 03/30/2023   CHOLHDL 4.6 03/30/2023   VLDL 14 03/30/2023   LDLCALC 102 (H) 03/30/2023   LDLCALC 101 (H) 11/14/2022    See Psychiatric Specialty Exam and Suicide Risk Assessment completed by Attending Physician prior to  discharge.  Discharge destination:  Home  Is patient on multiple antipsychotic therapies at discharge:  No   Has Patient had three or more failed trials of antipsychotic monotherapy by history:  No  Recommended Plan for Multiple Antipsychotic Therapies: NA  Discharge Instructions     Diet - low sodium heart healthy   Complete by: As directed    Increase activity slowly   Complete by: As directed       Allergies as of 04/03/2023       Reactions   Benadryl [diphenhydramine Hcl] Other (See Comments)   Causes seizures   Haldol [haloperidol] Anaphylaxis   Tongue swelling.   Latex Swelling   Labial edema after usage of latex condoms.    Shellfish Allergy Anaphylaxis, Other (See Comments)   Patient states her throat gets tight   Zithromax [azithromycin] Anaphylaxis, Swelling   Camphor Other (See Comments)   unknown   Gluten Meal Other (See Comments)   GI Intolerance   Lactose Intolerance (gi) Diarrhea        Medication List     STOP taking these medications    doxycycline 100 MG capsule Commonly known as: VIBRAMYCIN Replaced by: doxycycline 100 MG tablet   escitalopram 5 MG tablet Commonly known as: LEXAPRO   risperiDONE 0.5 MG tablet Commonly known as: RISPERDAL       TAKE these medications      Indication  albuterol 108 (90 Base) MCG/ACT inhaler Commonly known as: VENTOLIN HFA Inhale 1 puff into the lungs 4 (four) times daily as needed for wheezing or shortness of breath.  Indication: Asthma   benzonatate 100 MG capsule Commonly known as: TESSALON Take 1 capsule (100 mg total) by mouth every 8 (eight) hours.  Indication: Cough   busPIRone 10 MG tablet Commonly known as: BUSPAR Take 1 tablet (10 mg total) by mouth 3 (three) times daily. What changed:  medication strength how much to take  Indication: Anxiety Disorder   doxycycline 100 MG tablet Commonly known as: VIBRA-TABS Take 1 tablet (100 mg total) by mouth every 12 (  twelve)  hours. Replaces: doxycycline 100 MG capsule  Indication: Sexually Transmitted Disease   ferrous sulfate 325 (65 FE) MG tablet Take 1 tablet (325 mg total) by mouth at bedtime. What changed: when to take this  Indication: Iron Deficiency   hydrOXYzine 50 MG tablet Commonly known as: ATARAX Take 1 tablet (50 mg total) by mouth 3 (three) times daily as needed for anxiety.  Indication: Feeling Anxious   medroxyPROGESTERone Acetate 150 MG/ML Susy Inject 150 mg into the muscle every 3 (three) months.  Indication: Birth Control Treatment   metroNIDAZOLE 500 MG tablet Commonly known as: FLAGYL Take 1 tablet (500 mg total) by mouth every 12 (twelve) hours. What changed: when to take this  Indication: Sexually Transmitted Disease   nicotine 14 mg/24hr patch Commonly known as: NICODERM CQ - dosed in mg/24 hours Place 1 patch (14 mg total) onto the skin daily at 6 (six) AM. Start taking on: April 04, 2023  Indication: Nicotine Addiction   valACYclovir 1000 MG tablet Commonly known as: VALTREX Take 1 tablet (1,000 mg total) by mouth 2 (two) times daily.  Indication: Herpes Simplex Infection        Follow-up Information     [facility] Follow up.   Specialty: Behavioral Health Why: You have an appointment for medication management services on 04/12/23 at 11:30 am.  Therapy services are also available. Contact information: [address] [place] [address] [phone]                Follow-up recommendations/Comments:    Activity: as tolerated   Diet: heart healthy   Other: -Follow-up with your outpatient psychiatric provider -instructions on appointment date, time, and address (location) are provided to you in discharge paperwork.   -Take your psychiatric medications as prescribed at discharge - instructions are provided to you in the discharge paperwork   -Follow-up with outpatient primary care doctor and other specialists  -for management of chronic medical disease, including: Elevated Lipid Panel   -Testing: Follow-up with outpatient provider for abnormal lab results: Elevated Lipid Panel   -Recommend abstinence from alcohol, tobacco, and other illicit drug use at discharge.    -If your psychiatric symptoms recur, worsen, or if you have side effects to your psychiatric medications, call your outpatient psychiatric provider, 911, [phone] or go to the nearest emergency department.   -If suicidal thoughts recur, call your outpatient psychiatric provider, 911, [phone] or go to the nearest emergency department.  Signed: Lauro Franklin, MD 04/03/2023, 5:07 PM

## 2023-04-12 ENCOUNTER — Encounter (HOSPITAL_COMMUNITY): Payer: 59 | Admitting: Physician Assistant

## 2023-04-19 ENCOUNTER — Ambulatory Visit (HOSPITAL_COMMUNITY)
Admission: RE | Admit: 2023-04-19 | Discharge: 2023-04-19 | Disposition: A | Discharge status: Home / Self Care | Payer: 59 | Source: Ambulatory Visit | Attending: Family Medicine | Admitting: Family Medicine

## 2023-04-19 ENCOUNTER — Encounter (HOSPITAL_COMMUNITY): Payer: Self-pay

## 2023-04-19 ENCOUNTER — Other Ambulatory Visit: Payer: Self-pay

## 2023-04-19 ENCOUNTER — Emergency Department (HOSPITAL_COMMUNITY)
Admission: EM | Admit: 2023-04-19 | Discharge: 2023-04-19 | Disposition: A | Discharge status: Home / Self Care | Payer: 59 | Attending: Emergency Medicine | Admitting: Emergency Medicine

## 2023-04-19 ENCOUNTER — Emergency Department (HOSPITAL_COMMUNITY): Payer: 59

## 2023-04-19 DIAGNOSIS — R072 Precordial pain: Secondary | ICD-10-CM | POA: Diagnosis not present

## 2023-04-19 DIAGNOSIS — R079 Chest pain, unspecified: Secondary | ICD-10-CM | POA: Diagnosis not present

## 2023-04-19 DIAGNOSIS — R0789 Other chest pain: Secondary | ICD-10-CM

## 2023-04-19 DIAGNOSIS — R1084 Generalized abdominal pain: Secondary | ICD-10-CM | POA: Diagnosis not present

## 2023-04-19 DIAGNOSIS — Z91040 Latex allergy status: Secondary | ICD-10-CM | POA: Insufficient documentation

## 2023-04-19 DIAGNOSIS — M542 Cervicalgia: Secondary | ICD-10-CM | POA: Diagnosis not present

## 2023-04-19 DIAGNOSIS — I499 Cardiac arrhythmia, unspecified: Secondary | ICD-10-CM | POA: Diagnosis not present

## 2023-04-19 LAB — CBC
HCT: 36.9 % (ref 36.0–46.0)
Hemoglobin: 12.3 g/dL (ref 12.0–15.0)
MCH: 26.5 pg (ref 26.0–34.0)
MCHC: 33.3 g/dL (ref 30.0–36.0)
MCV: 79.5 fL — ABNORMAL LOW (ref 80.0–100.0)
Platelets: 251 10*3/uL (ref 150–400)
RBC: 4.64 MIL/uL (ref 3.87–5.11)
RDW: 15.3 % (ref 11.5–15.5)
WBC: 9.1 10*3/uL (ref 4.0–10.5)
nRBC: 0 % (ref 0.0–0.2)

## 2023-04-19 LAB — BASIC METABOLIC PANEL
Anion gap: 12 (ref 5–15)
BUN: 8 mg/dL (ref 6–20)
CO2: 23 mmol/L (ref 22–32)
Calcium: 9.3 mg/dL (ref 8.9–10.3)
Chloride: 102 mmol/L (ref 98–111)
Creatinine, Ser: 0.86 mg/dL (ref 0.44–1.00)
GFR, Estimated: >60 mL/min (ref >60)
Glucose, Bld: 96 mg/dL (ref 70–99)
Potassium: 3.6 mmol/L (ref 3.5–5.1)
Sodium: 137 mmol/L (ref 135–145)

## 2023-04-19 LAB — HCG, SERUM, QUALITATIVE: Preg, Serum: NEGATIVE

## 2023-04-19 LAB — TROPONIN I (HIGH SENSITIVITY): Troponin I (High Sensitivity): 3 ng/L (ref <18)

## 2023-04-19 MED ORDER — ACETAMINOPHEN 325 MG PO TABS
650.0000 mg | ORAL_TABLET | Freq: Once | ORAL | Status: AC
  Administered 2023-04-19: 650 mg via ORAL
  Filled 2023-04-19: qty 2

## 2023-04-19 MED ORDER — IBUPROFEN 400 MG PO TABS
400.0000 mg | ORAL_TABLET | Freq: Once | ORAL | Status: AC
  Administered 2023-04-19: 400 mg via ORAL
  Filled 2023-04-19: qty 1

## 2023-04-19 NOTE — ED Notes (Addendum)
Provider went into room and pt was gone from room. I called and voicemail doesn't have any identifying information. We have discarded the samples left in the room and she will have to have another visit and recollect if she returns.

## 2023-04-19 NOTE — Discharge Instructions (Signed)
You were seen in the emergency department for your chest pain.  Your workup showed no signs of heart attack or stress on your heart and no signs of pneumonia.  You may have some inflammation of your chest wall and you can take Tylenol and Motrin as needed for pain.  You should follow-up with your primary doctor in the next few days to have your symptoms rechecked.  You should return to the emergency department for significantly worsening pain, severe shortness of breath, if you pass out or if you have any other new or concerning symptoms.

## 2023-04-19 NOTE — ED Provider Notes (Signed)
[place] EMERGENCY DEPARTMENT AT [facility] Provider Note   CSN: [id number] Arrival date & time: 04/19/23  5284     History  Chief Complaint  Patient presents with   Chest Pain    Melissa [name] is a 22 y.o. female.  Patient is a 22 year old female with a past medical history of bipolar disorder presenting to the emergency department with chest pain.  The patient states for the last 2 days she has had intermittent midsternal chest pain.  She states it feels like a sharp type of pain that is worse with movements and deep breaths.  She states that she has been lifting her kids but has had no trauma or changes in activity.  She denies any shortness of breath.  She states that she has had a mild runny nose and a nonproductive cough.  She denies any fevers.  She denies any lower extremity swelling.  She states that she is on the Depo shot and denies any recent hospitalization or surgery, recent travel long car or plane, cancer history or VTE history.  The history is provided by the patient.  Chest Pain      Home Medications Prior to Admission medications   Medication Sig Start Date End Date Taking? Authorizing Provider  albuterol (VENTOLIN HFA) 108 (90 Base) MCG/ACT inhaler Inhale 1 puff into the lungs 4 (four) times daily as needed for wheezing or shortness of breath.    [provider]  benzonatate (TESSALON) 100 MG capsule Take 1 capsule (100 mg total) by mouth every 8 (eight) hours. 02/18/23   Jeanelle Malling, PA  busPIRone (BUSPAR) 10 MG tablet Take 1 tablet (10 mg total) by mouth 3 (three) times daily. 04/03/23   Lauro Franklin, MD  doxycycline (VIBRA-TABS) 100 MG tablet Take 1 tablet (100 mg total) by mouth every 12 (twelve) hours. 04/03/23   Lauro Franklin, MD  ferrous sulfate 325 (65 FE) MG tablet Take 1 tablet (325 mg total) by mouth at bedtime. Patient taking differently: Take 325 mg by mouth every other day. 04/14/22   Marylene Land, CNM  hydrOXYzine (ATARAX) 50 MG tablet Take 1 tablet (50 mg total) by mouth 3 (three) times daily as needed for anxiety. 03/14/23   Nwoko, Tommas Olp, PA  medroxyPROGESTERone Acetate 150 MG/ML SUSY Inject 150 mg into the muscle every 3 (three) months. 08/08/22   [provider]  metroNIDAZOLE (FLAGYL) 500 MG tablet Take 1 tablet (500 mg total) by mouth every 12 (twelve) hours. 04/03/23   Lauro Franklin, MD  nicotine (NICODERM CQ - DOSED IN MG/24 HOURS) 14 mg/24hr patch Place 1 patch (14 mg total) onto the skin daily at 6 (six) AM. 04/04/23   Pashayan, Mardelle Matte, MD  valACYclovir (VALTREX) 1000 MG tablet Take 1 tablet (1,000 mg total) by mouth 2 (two) times daily. 05/22/22   Thressa Sheller D, CNM  rizatriptan (MAXALT-MLT) 10 MG disintegrating tablet Take 1 tablet at onset of migraine with 2 ibuprofen may repeat an additional tablet in 2 hours if needed 05/07/20 06/09/20  Deetta Perla, MD      Allergies    Benadryl [diphenhydramine hcl], Haldol [haloperidol], Latex, Shellfish allergy, Zithromax [azithromycin], Camphor, Gluten meal, and Lactose intolerance (gi)    Review of Systems   Review of Systems  Cardiovascular:  Positive for chest pain.    Physical Exam Updated Vital Signs BP 117/85 (BP Location: Right Arm)   Pulse 83   Temp 97.8 F (36.6 C) (  Oral)   Resp 15   Ht 5\' 7"  (1.702 m)   Wt 108 kg   SpO2 99%   BMI 37.28 kg/m  Physical Exam Vitals and nursing note reviewed.  Constitutional:      General: She is not in acute distress.    Appearance: She is well-developed.     Comments: Asleep in bed, easily arousable by verbal stimulation  HENT:     Head: Normocephalic and atraumatic.  Eyes:     Extraocular Movements: Extraocular movements intact.  Cardiovascular:     Rate and Rhythm: Normal rate and regular rhythm.     Pulses:          Radial pulses are 2+ on the right side and 2+ on the left side.       Dorsalis pedis pulses are 2+ on the right  side and 2+ on the left side.     Heart sounds: Normal heart sounds.  Pulmonary:     Effort: Pulmonary effort is normal.     Breath sounds: Normal breath sounds.  Chest:     Chest wall: Tenderness (Midsternal) present.  Abdominal:     Palpations: Abdomen is soft.     Tenderness: There is no abdominal tenderness.  Musculoskeletal:        General: Normal range of motion.     Cervical back: Normal range of motion and neck supple.     Right lower leg: No edema.     Left lower leg: No edema.  Skin:    General: Skin is warm and dry.  Neurological:     General: No focal deficit present.     Mental Status: She is alert and oriented to person, place, and time.  Psychiatric:        Mood and Affect: Mood normal.        Behavior: Behavior normal.     ED Results / Procedures / Treatments   Labs (all labs ordered are listed, but only abnormal results are displayed) Labs Reviewed  CBC - Abnormal; Notable for the following components:      Result Value   MCV 79.5 (*)    All other components within normal limits  BASIC METABOLIC PANEL  HCG, SERUM, QUALITATIVE  TROPONIN I (HIGH SENSITIVITY)    EKG EKG Interpretation Date/Time:  Thursday April 19 2023 06:30:23 EDT Ventricular Rate:  86 PR Interval:  154 QRS Duration:  86 QT Interval:  380 QTC Calculation: 454 R Axis:   65  Text Interpretation: Normal sinus rhythm Normal ECG No significant change since last tracing Confirmed by Elayne Snare ([id number]) on 04/19/2023 9:15:21 AM  Radiology DG Chest 2 View  Result Date: 04/19/2023 CLINICAL DATA:  Chest pain.  Cp EXAM: CHEST - 2 VIEW COMPARISON:  Chest XR, 02/18/2019 FINDINGS: Cardiomediastinal silhouette is within normal limits. Lungs are well inflated. No focal consolidation or mass. No pleural effusion or pneumothorax. No acute displaced fracture. IMPRESSION: No acute cardiopulmonary process. Electronically Signed   By: Roanna Banning M.D.   On: 04/19/2023 07:31     Procedures Procedures    Medications Ordered in ED Medications  acetaminophen (TYLENOL) tablet 650 mg (has no administration in time range)  ibuprofen (ADVIL) tablet 400 mg (has no administration in time range)    ED Course/ Medical Decision Making/ A&P  Medical Decision Making This patient presents to the ED with chief complaint(s) of chest pain with pertinent past medical history of bipolar disorder which further complicates the presenting complaint. The complaint involves an extensive differential diagnosis and also carries with it a high risk of complications and morbidity.    The differential diagnosis includes ACS, arrhythmia, anemia, pneumonia, pneumothorax, pulmonary edema, pleural effusion, costochondritis, muscle strain, she is negative making PE unlikely  Additional history obtained: Additional history obtained from N/A Records reviewed N/A  ED Course and Reassessment: On patient's arrival she is hemodynamically stable in no acute distress.  She was initially evaluated by triage and had EKG, labs including troponin and chest x-ray performed.  Patient's EKG showed normal sinus rhythm without acute ischemic changes.  Chest x-ray showed no acute disease.  Troponin was negative.  Symptoms have been ongoing for several days a single troponin is sufficient.  She does have significant reproducible chest wall tenderness concerning for possible muscle strain or costochondritis and will be treated with Tylenol Motrin.  Independent labs interpretation:  The following labs were independently interpreted: Normal range  Independent visualization of imaging: - I independently visualized the following imaging with scope of interpretation limited to determining acute life threatening conditions related to emergency care: Chest x-ray, which revealed no acute disease  Consultation: - Consulted or discussed management/test interpretation w/ external  professional: N/A  Consideration for admission or further workup: Patient has no emergent conditions requiring admission or further work-up at this time and is stable for discharge home with primary care follow-up  Social Determinants of health: N/A    Amount and/or Complexity of Data Reviewed Labs: ordered. Radiology: ordered.  Risk OTC drugs. Prescription drug management.          Final Clinical Impression(s) / ED Diagnoses Final diagnoses:  Chest wall pain    Rx / DC Orders ED Discharge Orders     None         Rexford Maus, DO 04/19/23 1001

## 2023-04-19 NOTE — ED Notes (Signed)
Patient discharged by this RN. Discharge instructions reviewed, patient verbalized understanding with no additional questions. Ambulatory to lobby.

## 2023-04-19 NOTE — ED Triage Notes (Signed)
Patient here today with c/o vaginal discharge, odor, and itching, pain in urination and during sex X 1 week. She tested positive for Chlamydia on 03/29/2023 and completed the medication that she was prescribed but still having symptoms.

## 2023-04-19 NOTE — ED Triage Notes (Signed)
Pt BIB [facility] d/t CP with SOB and ABD pain along with neck pain onset 2 days ago.  Pt was picked up on side of road.

## 2023-05-09 ENCOUNTER — Emergency Department (HOSPITAL_COMMUNITY): Payer: 59

## 2023-05-09 ENCOUNTER — Emergency Department (HOSPITAL_COMMUNITY)
Admission: EM | Admit: 2023-05-09 | Discharge: 2023-05-09 | Disposition: A | Discharge status: Home / Self Care | Payer: 59 | Attending: Emergency Medicine | Admitting: Emergency Medicine

## 2023-05-09 ENCOUNTER — Encounter (HOSPITAL_COMMUNITY): Payer: Self-pay | Admitting: Emergency Medicine

## 2023-05-09 DIAGNOSIS — R0789 Other chest pain: Secondary | ICD-10-CM | POA: Insufficient documentation

## 2023-05-09 DIAGNOSIS — Z20822 Contact with and (suspected) exposure to covid-19: Secondary | ICD-10-CM | POA: Insufficient documentation

## 2023-05-09 DIAGNOSIS — Z91040 Latex allergy status: Secondary | ICD-10-CM | POA: Insufficient documentation

## 2023-05-09 DIAGNOSIS — J069 Acute upper respiratory infection, unspecified: Secondary | ICD-10-CM | POA: Insufficient documentation

## 2023-05-09 DIAGNOSIS — B9789 Other viral agents as the cause of diseases classified elsewhere: Secondary | ICD-10-CM | POA: Diagnosis not present

## 2023-05-09 DIAGNOSIS — R000 Tachycardia, unspecified: Secondary | ICD-10-CM | POA: Diagnosis not present

## 2023-05-09 DIAGNOSIS — R059 Cough, unspecified: Secondary | ICD-10-CM | POA: Diagnosis not present

## 2023-05-09 DIAGNOSIS — R079 Chest pain, unspecified: Secondary | ICD-10-CM | POA: Diagnosis not present

## 2023-05-09 DIAGNOSIS — R051 Acute cough: Secondary | ICD-10-CM

## 2023-05-09 DIAGNOSIS — I10 Essential (primary) hypertension: Secondary | ICD-10-CM | POA: Diagnosis not present

## 2023-05-09 LAB — CBC
HCT: 39.5 % (ref 36.0–46.0)
Hemoglobin: 12.7 g/dL (ref 12.0–15.0)
MCH: 26.1 pg (ref 26.0–34.0)
MCHC: 32.2 g/dL (ref 30.0–36.0)
MCV: 81.3 fL (ref 80.0–100.0)
Platelets: 235 10*3/uL (ref 150–400)
RBC: 4.86 MIL/uL (ref 3.87–5.11)
RDW: 14.6 % (ref 11.5–15.5)
WBC: 8.5 10*3/uL (ref 4.0–10.5)
nRBC: 0 % (ref 0.0–0.2)

## 2023-05-09 LAB — BASIC METABOLIC PANEL WITH GFR
Anion gap: 13 (ref 5–15)
BUN: 11 mg/dL (ref 6–20)
CO2: 23 mmol/L (ref 22–32)
Calcium: 9.3 mg/dL (ref 8.9–10.3)
Chloride: 103 mmol/L (ref 98–111)
Creatinine, Ser: 0.83 mg/dL (ref 0.44–1.00)
GFR, Estimated: >60 mL/min (ref >60)
Glucose, Bld: 87 mg/dL (ref 70–99)
Potassium: 4 mmol/L (ref 3.5–5.1)
Sodium: 139 mmol/L (ref 135–145)

## 2023-05-09 LAB — HCG, SERUM, QUALITATIVE: Preg, Serum: NEGATIVE

## 2023-05-09 LAB — RESP PANEL BY RT-PCR (RSV, FLU A&B, COVID)  RVPGX2
Influenza A by PCR: NEGATIVE
Influenza B by PCR: NEGATIVE
Resp Syncytial Virus by PCR: NEGATIVE
SARS Coronavirus 2 by RT PCR: NEGATIVE

## 2023-05-09 LAB — TROPONIN I (HIGH SENSITIVITY): Troponin I (High Sensitivity): 3 ng/L (ref <18)

## 2023-05-09 MED ORDER — BENZONATATE 100 MG PO CAPS: 100.0000 mg | ORAL_CAPSULE | Freq: Three times a day (TID) | ORAL | 0 refills | Status: DC

## 2023-05-09 MED ORDER — IBUPROFEN 400 MG PO TABS
600.0000 mg | ORAL_TABLET | Freq: Once | ORAL | Status: AC
  Administered 2023-05-09: 600 mg via ORAL
  Filled 2023-05-09: qty 1

## 2023-05-09 MED ORDER — ACETAMINOPHEN 325 MG PO TABS
650.0000 mg | ORAL_TABLET | Freq: Once | ORAL | Status: AC
  Administered 2023-05-09: 650 mg via ORAL
  Filled 2023-05-09: qty 2

## 2023-05-09 NOTE — Discharge Instructions (Signed)
Please use Tylenol or ibuprofen for pain.  You may use 600 mg ibuprofen every 6 hours or 1000 mg of Tylenol every 6 hours.  You may choose to alternate between the 2.  This would be most effective.  Not to exceed 4 g of Tylenol within 24 hours.  Not to exceed 3200 mg ibuprofen 24 hours.  Your symptoms are consistent with an upper respiratory infection that is likely viral in nature, but your tests were negative for COVID, flu, RSV.  I prescribed you some Tessalon Perles which is a medication that can help with cough.  You can take them as needed.  You can use over-the-counter cough and cold medication such as DayQuil, NyQuil, Mucinex, Robitussin as needed.

## 2023-05-09 NOTE — ED Triage Notes (Signed)
Pt here from home with c/o htn and chest pain and covid like symptoms , has been using some afrin today , got asa and nito from [facility]

## 2023-05-09 NOTE — ED Provider Notes (Signed)
[place] EMERGENCY DEPARTMENT AT [facility] Provider Note   CSN: [id number] Arrival date & time: 05/09/23  1722     History  Chief Complaint  Patient presents with   Chest Pain   Cough    Melissa [name] is a 22 y.o. female with past medical history significant for anxiety, mild intellectual disability, depression with psychotic features, bipolar, seizures who presents with concern for sternal chest pain beginning yesterday.  Patient reports that chest pain seem to begin out of nowhere, she denies any worsening with exertion, she reports that today she developed some cold and flulike symptoms with cough, body aches, congestion.  Her children have been sick with something she reports but have not been tested.  Patient reports her chest pain is worse with cough.  Per EMS she had severely elevated blood pressure around 250/180 verified x 2 manually.  They administered aspirin and nitro, patient reports that the pain did not immediately resolve but she is no longer having any chest pain at rest.  Still having some pain with coughing.  She denies any recent travel, hemoptysis, pain with deep breathing.  Additionally with a history of asthma and reports that she used her inhaler 4 times today.  Last albuterol 1 hour prior to arrival.   Chest Pain Associated symptoms: cough   Cough Associated symptoms: chest pain        Home Medications Prior to Admission medications   Medication Sig Start Date End Date Taking? Authorizing Provider  benzonatate (TESSALON) 100 MG capsule Take 1 capsule (100 mg total) by mouth every 8 (eight) hours. 05/09/23  Yes Ruthetta Koopmann H, PA-C  albuterol (VENTOLIN HFA) 108 (90 Base) MCG/ACT inhaler Inhale 1 puff into the lungs 4 (four) times daily as needed for wheezing or shortness of breath.    [provider]  busPIRone (BUSPAR) 10 MG tablet Take 1 tablet (10 mg total) by mouth 3 (three) times daily. 04/03/23   Lauro Franklin, MD  doxycycline (VIBRA-TABS) 100 MG tablet Take 1 tablet (100 mg total) by mouth every 12 (twelve) hours. 04/03/23   Lauro Franklin, MD  ferrous sulfate 325 (65 FE) MG tablet Take 1 tablet (325 mg total) by mouth at bedtime. Patient taking differently: Take 325 mg by mouth every other day. 04/14/22   Marylene Land, CNM  hydrOXYzine (ATARAX) 50 MG tablet Take 1 tablet (50 mg total) by mouth 3 (three) times daily as needed for anxiety. 03/14/23   Nwoko, Tommas Olp, PA  medroxyPROGESTERone Acetate 150 MG/ML SUSY Inject 150 mg into the muscle every 3 (three) months. 08/08/22   [provider]  metroNIDAZOLE (FLAGYL) 500 MG tablet Take 1 tablet (500 mg total) by mouth every 12 (twelve) hours. 04/03/23   Lauro Franklin, MD  nicotine (NICODERM CQ - DOSED IN MG/24 HOURS) 14 mg/24hr patch Place 1 patch (14 mg total) onto the skin daily at 6 (six) AM. 04/04/23   Pashayan, Mardelle Matte, MD  valACYclovir (VALTREX) 1000 MG tablet Take 1 tablet (1,000 mg total) by mouth 2 (two) times daily. 05/22/22   Thressa Sheller D, CNM  rizatriptan (MAXALT-MLT) 10 MG disintegrating tablet Take 1 tablet at onset of migraine with 2 ibuprofen may repeat an additional tablet in 2 hours if needed 05/07/20 06/09/20  Deetta Perla, MD      Allergies    Benadryl [diphenhydramine hcl], Haldol [haloperidol], Latex, Shellfish allergy, Zithromax [azithromycin], Camphor, Gluten meal, and Lactose intolerance (gi)  Review of Systems   Review of Systems  Respiratory:  Positive for cough.   Cardiovascular:  Positive for chest pain.  All other systems reviewed and are negative.   Physical Exam Updated Vital Signs BP 121/81   Pulse 86   Temp (!) 97.5 F (36.4 C) (Oral)   Resp (!) 23   LMP 04/05/2023 (Approximate)   SpO2 100%  Physical Exam Vitals and nursing note reviewed.  Constitutional:      General: She is not in acute distress.    Appearance: Normal appearance.  HENT:     Head:  Normocephalic and atraumatic.  Eyes:     General:        Right eye: No discharge.        Left eye: No discharge.  Cardiovascular:     Rate and Rhythm: Normal rate and regular rhythm.     Heart sounds: No murmur heard.    No friction rub. No gallop.  Pulmonary:     Effort: Pulmonary effort is normal.     Breath sounds: Normal breath sounds.     Comments: No wheezing, rhonchi, stridor, rales, no respiratory distress. Chest:     Comments: Some tenderness to palpation of anterior chest wall at the midline.  No step-off, deformity.  No murmurs rubs and gallops on cardiac auscultation, normal rate and rhythm. Abdominal:     General: Bowel sounds are normal.     Palpations: Abdomen is soft.  Skin:    General: Skin is warm and dry.     Capillary Refill: Capillary refill takes less than 2 seconds.  Neurological:     Mental Status: She is alert and oriented to person, place, and time.  Psychiatric:        Mood and Affect: Mood normal.        Behavior: Behavior normal.     ED Results / Procedures / Treatments   Labs (all labs ordered are listed, but only abnormal results are displayed) Labs Reviewed  RESP PANEL BY RT-PCR (RSV, FLU A&B, COVID)  [id number]  BASIC METABOLIC PANEL  CBC  HCG, SERUM, QUALITATIVE  TROPONIN I (HIGH SENSITIVITY)    EKG None  Radiology DG Chest 2 View  Result Date: 05/09/2023 CLINICAL DATA:  Chest pain. EXAM: CHEST - 2 VIEW COMPARISON:  04/19/2023. FINDINGS: Bilateral lung fields are clear. Bilateral costophrenic angles are clear. Normal cardio-mediastinal silhouette. No acute osseous abnormalities. The soft tissues are within normal limits. IMPRESSION: No active cardiopulmonary disease. Electronically Signed   By: Jules Schick M.D.   On: 05/09/2023 18:51    Procedures Procedures    Medications Ordered in ED Medications  acetaminophen (TYLENOL) tablet 650 mg (650 mg Oral Given 05/09/23 1930)  ibuprofen (ADVIL) tablet 600 mg (600 mg Oral Given 05/09/23  2038)    ED Course/ Medical Decision Making/ A&P                                 Medical Decision Making  This is a well-appearing 22 yo female who presents with concern for 2 days of cough, fever, sore throat, chest pain with cough, although she reports that it was more persistent yesterday..  My emergent differential diagnosis includes acute upper respiratory infection with COVID, flu, RSV versus new asthma presentation, acute bronchitis, less clinical concern for pneumonia.  Also considered other ENT emergencies, Ludwig angina, strep pharyngitis, mono, versus epiglottis, tonsillitis versus other.  This is not  an exhaustive differential.  On my exam patient is overall well-appearing, they have temperature of 97.5, breathing unlabored, no tachypnea, no respiratory distress, stable oxygen saturation.  Reportedly prior to arrival patient with significantly elevated blood pressure by EMS of 250/180.  This was not validated in the emergency department and patient has had no hypertension throughout her protracted stay, suspect that this was a spurious reading, but nonetheless lab work was provided to rule out hypertensive emergency.  I independently interpreted CBC, BMP, troponin x 1 in context of chest pain ongoing since yesterday with no active chest pain on arrival.  Results are all unremarkable.  Patient without tachycardia. RVP independently reviewed by myself shows negative for COVID, flu, RSV.  Patient symptoms are consistent with viral upper respiratory infection with pleuritic cough encouraged ibuprofen, Tylenol, rest, plenty of fluids.  Discussed extensive return precautions.  Patient discharged in stable condition at this time.  Final Clinical Impression(s) / ED Diagnoses Final diagnoses:  Acute cough  Viral upper respiratory tract infection  Chest wall pain    Rx / DC Orders ED Discharge Orders          Ordered    benzonatate (TESSALON) 100 MG capsule  Every 8 hours        05/09/23  2031              [place] 05/09/23 2111    Lonell Grandchild, MD 05/10/23 1819

## 2023-05-15 ENCOUNTER — Encounter (HOSPITAL_COMMUNITY): Payer: Self-pay | Admitting: Emergency Medicine

## 2023-05-15 ENCOUNTER — Emergency Department (HOSPITAL_COMMUNITY)
Admission: EM | Admit: 2023-05-15 | Discharge: 2023-05-15 | Discharge status: Against Medical Advice | Payer: 59 | Attending: Emergency Medicine | Admitting: Emergency Medicine

## 2023-05-15 DIAGNOSIS — Z91040 Latex allergy status: Secondary | ICD-10-CM | POA: Insufficient documentation

## 2023-05-15 DIAGNOSIS — J069 Acute upper respiratory infection, unspecified: Secondary | ICD-10-CM | POA: Diagnosis not present

## 2023-05-15 DIAGNOSIS — B9789 Other viral agents as the cause of diseases classified elsewhere: Secondary | ICD-10-CM | POA: Diagnosis not present

## 2023-05-15 DIAGNOSIS — N898 Other specified noninflammatory disorders of vagina: Secondary | ICD-10-CM | POA: Insufficient documentation

## 2023-05-15 DIAGNOSIS — Z5329 Procedure and treatment not carried out because of patient's decision for other reasons: Secondary | ICD-10-CM | POA: Diagnosis not present

## 2023-05-15 LAB — URINALYSIS, ROUTINE W REFLEX MICROSCOPIC
Bilirubin Urine: NEGATIVE
Glucose, UA: NEGATIVE mg/dL
Hgb urine dipstick: NEGATIVE
Ketones, ur: NEGATIVE mg/dL
Nitrite: NEGATIVE
Protein, ur: NEGATIVE mg/dL
Specific Gravity, Urine: 1.006 (ref 1.005–1.030)
pH: 6 (ref 5.0–8.0)

## 2023-05-15 LAB — HIV ANTIBODY (ROUTINE TESTING W REFLEX): HIV Screen 4th Generation wRfx: NONREACTIVE

## 2023-05-15 LAB — PREGNANCY, URINE: Preg Test, Ur: NEGATIVE

## 2023-05-15 LAB — RPR: RPR Ser Ql: NONREACTIVE

## 2023-05-15 NOTE — ED Notes (Signed)
POC Urine preg test was negative at triage

## 2023-05-15 NOTE — ED Triage Notes (Signed)
Pt reports abnormal discharge and unsure if pregnant as well as pelvic cramping. Also, seen recently for URI and still coughing. Room smells heavily of cigarette smoke.

## 2023-05-15 NOTE — ED Provider Notes (Signed)
[place] EMERGENCY DEPARTMENT AT [facility] Provider Note   CSN: [id number] Arrival date & time: 05/15/23  0413     History  Chief Complaint  Patient presents with   Vaginal Discharge   Cough    Melissa [name] is a 22 y.o. female with PMHx PID, migraine, TBI, anxiety, asthma, genital herpes, seizures who presents to ED concerned for vaginal discharge and pelvic cramping. Also complaining of dysuria x1 week. Patient with extensive hx of STD and unprotected sex.   Of note, patient was seen in ED recently for URI, is still complaining of cough. Has tried over the counter medications without much relief.  Denies fever, chest pain, nausea, vomiting, diarrhea, dysuria, hematuria, hematochezia.    Vaginal Discharge Cough      Home Medications Prior to Admission medications   Medication Sig Start Date End Date Taking? Authorizing Provider  albuterol (VENTOLIN HFA) 108 (90 Base) MCG/ACT inhaler Inhale 1 puff into the lungs 4 (four) times daily as needed for wheezing or shortness of breath.    [provider]  benzonatate (TESSALON) 100 MG capsule Take 1 capsule (100 mg total) by mouth every 8 (eight) hours. 05/09/23   Prosperi, Christian H, PA-C  busPIRone (BUSPAR) 10 MG tablet Take 1 tablet (10 mg total) by mouth 3 (three) times daily. 04/03/23   Lauro Franklin, MD  doxycycline (VIBRA-TABS) 100 MG tablet Take 1 tablet (100 mg total) by mouth every 12 (twelve) hours. 04/03/23   Lauro Franklin, MD  ferrous sulfate 325 (65 FE) MG tablet Take 1 tablet (325 mg total) by mouth at bedtime. Patient taking differently: Take 325 mg by mouth every other day. 04/14/22   Marylene Land, CNM  hydrOXYzine (ATARAX) 50 MG tablet Take 1 tablet (50 mg total) by mouth 3 (three) times daily as needed for anxiety. 03/14/23   Nwoko, Tommas Olp, PA  medroxyPROGESTERone Acetate 150 MG/ML SUSY Inject 150 mg into the muscle every 3 (three) months. 08/08/22    [provider]  metroNIDAZOLE (FLAGYL) 500 MG tablet Take 1 tablet (500 mg total) by mouth every 12 (twelve) hours. 04/03/23   Lauro Franklin, MD  nicotine (NICODERM CQ - DOSED IN MG/24 HOURS) 14 mg/24hr patch Place 1 patch (14 mg total) onto the skin daily at 6 (six) AM. 04/04/23   Pashayan, Mardelle Matte, MD  valACYclovir (VALTREX) 1000 MG tablet Take 1 tablet (1,000 mg total) by mouth 2 (two) times daily. 05/22/22   Thressa Sheller D, CNM  rizatriptan (MAXALT-MLT) 10 MG disintegrating tablet Take 1 tablet at onset of migraine with 2 ibuprofen may repeat an additional tablet in 2 hours if needed 05/07/20 06/09/20  Deetta Perla, MD      Allergies    Benadryl [diphenhydramine hcl], Haldol [haloperidol], Latex, Shellfish allergy, Zithromax [azithromycin], Camphor, Gluten meal, and Lactose intolerance (gi)    Review of Systems   Review of Systems  Respiratory:  Positive for cough.   Genitourinary:  Positive for vaginal discharge.    Physical Exam Updated Vital Signs BP 112/78 (BP Location: Right Arm)   Pulse 84   Temp 98.2 F (36.8 C) (Oral)   Resp 20   LMP 04/05/2023 (Approximate)   SpO2 100%  Physical Exam Vitals and nursing note reviewed.  Constitutional:      General: She is not in acute distress.    Appearance: She is not ill-appearing or toxic-appearing.  HENT:     Head: Normocephalic and atraumatic.  Eyes:  General: No scleral icterus.       Right eye: No discharge.        Left eye: No discharge.     Conjunctiva/sclera: Conjunctivae normal.  Cardiovascular:     Rate and Rhythm: Normal rate.  Pulmonary:     Effort: Pulmonary effort is normal.  Abdominal:     General: Abdomen is flat.  Skin:    General: Skin is warm and dry.  Neurological:     General: No focal deficit present.     Mental Status: She is alert. Mental status is at baseline.  Psychiatric:        Mood and Affect: Mood normal.        Behavior: Behavior normal.     ED Results /  Procedures / Treatments   Labs (all labs ordered are listed, but only abnormal results are displayed) Labs Reviewed  URINALYSIS, ROUTINE W REFLEX MICROSCOPIC - Abnormal; Notable for the following components:      Result Value   APPearance HAZY (*)    Leukocytes,Ua LARGE (*)    Bacteria, UA FEW (*)    All other components within normal limits  WET PREP, GENITAL  PREGNANCY, URINE  RPR  HIV ANTIBODY (ROUTINE TESTING W REFLEX)  GC/CHLAMYDIA PROBE AMP ([place]) NOT AT [facility]    EKG None  Radiology No results found.  Procedures Procedures    Medications Ordered in ED Medications - No data to display  ED Course/ Medical Decision Making/ A&P                                 Medical Decision Making Amount and/or Complexity of Data Reviewed Labs: ordered.    This patient presents to the ED for concern of vaginal discharge and pelvic cramping, this involves an extensive number of treatment options, and is a complaint that carries with it a high risk of complications and morbidity.  The differential diagnosis includes gastroenteritis, colitis, small bowel obstruction, appendicitis, cholecystitis, pancreatitis, nephrolithiasis, UTI, pyleonephritis, ruptured ectopic pregnancy, PID, ovarian.   Co morbidities that complicate the patient evaluation  PID, migraine, TBI, anxiety, asthma, genital herpes, seizures   Additional history obtained:  Patient diagnosed with PID 01/2023 and declined proper treatment   Problem List / ED Course / Critical interventions / Medication management  Patient presented for vaginal discharge and lower pelvic cramping.  Of note, patient was also seen recently in the emergency room with a URI.  Chest xray negative. Patient stating that she still has a cough that she needs to get rid of.  Over-the-counter medicines have not provided much relief. I educated patient on the possibility that honey can provide better relief for her cough. I shared with  patient that unfortunately there is not much medicine that will fully relieve a cough and that it will need more time to resolve. Patient afebrile with stable vitals.  Requested nurse to prepare patient for pelvic exam. Nurse then told me that patient is requesting to leave AMA. I attempted to visit patient before she left the ED but patient was already gone. Nurse stating that patient is going elsewhere for medical care because we could not cure her cough. I was not able to complete patient's medical exam/workup. I have reviewed the patients home medicines and have made adjustments as needed    Social Determinants of Health:  Hx of unprotected sex  Final Clinical Impression(s) / ED Diagnoses Final diagnoses:  Vaginal discharge  Viral upper respiratory tract infection    Rx / DC Orders ED Discharge Orders     None         Dorthy Cooler, [place] 05/15/23 1134    Terald Sleeper, MD 05/15/23 1152

## 2023-05-28 ENCOUNTER — Emergency Department (HOSPITAL_COMMUNITY)
Admission: EM | Admit: 2023-05-28 | Discharge: 2023-05-28 | Disposition: A | Discharge status: Home / Self Care | Payer: 59 | Attending: Emergency Medicine | Admitting: Emergency Medicine

## 2023-05-28 ENCOUNTER — Encounter (HOSPITAL_COMMUNITY): Payer: Self-pay

## 2023-05-28 ENCOUNTER — Other Ambulatory Visit: Payer: Self-pay

## 2023-05-28 ENCOUNTER — Emergency Department (HOSPITAL_COMMUNITY): Payer: 59

## 2023-05-28 DIAGNOSIS — N739 Female pelvic inflammatory disease, unspecified: Secondary | ICD-10-CM | POA: Diagnosis not present

## 2023-05-28 DIAGNOSIS — J45909 Unspecified asthma, uncomplicated: Secondary | ICD-10-CM | POA: Insufficient documentation

## 2023-05-28 DIAGNOSIS — Z91040 Latex allergy status: Secondary | ICD-10-CM | POA: Diagnosis not present

## 2023-05-28 DIAGNOSIS — R300 Dysuria: Secondary | ICD-10-CM | POA: Diagnosis not present

## 2023-05-28 DIAGNOSIS — A64 Unspecified sexually transmitted disease: Secondary | ICD-10-CM | POA: Diagnosis not present

## 2023-05-28 LAB — COMPREHENSIVE METABOLIC PANEL
ALT: 14 U/L (ref 0–44)
AST: 14 U/L — ABNORMAL LOW (ref 15–41)
Albumin: 3.6 g/dL (ref 3.5–5.0)
Alkaline Phosphatase: 95 U/L (ref 38–126)
Anion gap: 11 (ref 5–15)
BUN: 10 mg/dL (ref 6–20)
CO2: 24 mmol/L (ref 22–32)
Calcium: 9.2 mg/dL (ref 8.9–10.3)
Chloride: 101 mmol/L (ref 98–111)
Creatinine, Ser: 0.92 mg/dL (ref 0.44–1.00)
GFR, Estimated: >60 mL/min (ref >60)
Glucose, Bld: 106 mg/dL — ABNORMAL HIGH (ref 70–99)
Potassium: 3.6 mmol/L (ref 3.5–5.1)
Sodium: 136 mmol/L (ref 135–145)
Total Bilirubin: 0.4 mg/dL (ref 0.3–1.2)
Total Protein: 7.4 g/dL (ref 6.5–8.1)

## 2023-05-28 LAB — CBC WITH DIFFERENTIAL/PLATELET
Abs Immature Granulocytes: 0.04 10*3/uL (ref 0.00–0.07)
Basophils Absolute: 0 10*3/uL (ref 0.0–0.1)
Basophils Relative: 0 %
Eosinophils Absolute: 0.2 10*3/uL (ref 0.0–0.5)
Eosinophils Relative: 3 %
HCT: 38.7 % (ref 36.0–46.0)
Hemoglobin: 12.8 g/dL (ref 12.0–15.0)
Immature Granulocytes: 1 %
Lymphocytes Relative: 39 %
Lymphs Abs: 3.2 10*3/uL (ref 0.7–4.0)
MCH: 27.1 pg (ref 26.0–34.0)
MCHC: 33.1 g/dL (ref 30.0–36.0)
MCV: 82 fL (ref 80.0–100.0)
Monocytes Absolute: 0.7 10*3/uL (ref 0.1–1.0)
Monocytes Relative: 9 %
Neutro Abs: 3.9 10*3/uL (ref 1.7–7.7)
Neutrophils Relative %: 48 %
Platelets: 262 10*3/uL (ref 150–400)
RBC: 4.72 MIL/uL (ref 3.87–5.11)
RDW: 14 % (ref 11.5–15.5)
WBC: 8 10*3/uL (ref 4.0–10.5)
nRBC: 0 % (ref 0.0–0.2)

## 2023-05-28 LAB — URINALYSIS, ROUTINE W REFLEX MICROSCOPIC
Bilirubin Urine: NEGATIVE
Glucose, UA: NEGATIVE mg/dL
Hgb urine dipstick: NEGATIVE
Ketones, ur: NEGATIVE mg/dL
Nitrite: NEGATIVE
Protein, ur: NEGATIVE mg/dL
Specific Gravity, Urine: 1.006 (ref 1.005–1.030)
pH: 6 (ref 5.0–8.0)

## 2023-05-28 LAB — WET PREP, GENITAL
Sperm: NONE SEEN
Trich, Wet Prep: NONE SEEN
WBC, Wet Prep HPF POC: <10 (ref <10)
Yeast Wet Prep HPF POC: NONE SEEN

## 2023-05-28 LAB — PREGNANCY, URINE: Preg Test, Ur: NEGATIVE

## 2023-05-28 LAB — URINALYSIS, W/ REFLEX TO CULTURE (INFECTION SUSPECTED)
Bacteria, UA: NONE SEEN
Bilirubin Urine: NEGATIVE
Glucose, UA: NEGATIVE mg/dL
Hgb urine dipstick: NEGATIVE
Ketones, ur: NEGATIVE mg/dL
Nitrite: NEGATIVE
Protein, ur: NEGATIVE mg/dL
Specific Gravity, Urine: 1.005 (ref 1.005–1.030)
pH: 6 (ref 5.0–8.0)

## 2023-05-28 LAB — LIPASE, BLOOD: Lipase: 30 U/L (ref 11–51)

## 2023-05-28 LAB — HIV ANTIBODY (ROUTINE TESTING W REFLEX): HIV Screen 4th Generation wRfx: NONREACTIVE

## 2023-05-28 LAB — RPR: RPR Ser Ql: NONREACTIVE

## 2023-05-28 NOTE — ED Notes (Signed)
NAD noted upon transport to ultrasound.

## 2023-05-28 NOTE — ED Notes (Addendum)
EM provider with chaperone to do pelvic exam. Pt tolerated procedure well. NAD noted at this time.

## 2023-05-28 NOTE — ED Provider Notes (Signed)
[place] EMERGENCY DEPARTMENT AT [facility] Provider Note   CSN: [id number] Arrival date & time: 05/28/23  0443     History  Chief Complaint  Patient presents with   SEXUALLY TRANSMITTED DISEASE    Melissa [name] is a 22 y.o. female.  HPI Patient presents with dysuria pelvic pain and lower abdominal pain.  Has had nausea.  Has had previous visits for same.  Has had chlamydia.  States she was unable to keep down all the medicine last time.  States she Vomiting with it.  Also has potential reexposure from her boyfriend.  No diarrhea.  Has had prior visits for STDs.   Past Medical History:  Diagnosis Date   Anemia    Anxiety    Asthma    inhaler. 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   Seizures ([name])    last one in 2015 - on meds   Sickle cell trait ([name])    TBI (traumatic brain injury) ([name]) 2006    Home Medications Prior to Admission medications   Medication Sig Start Date End Date Taking? Authorizing Provider  albuterol (VENTOLIN HFA) 108 (90 Base) MCG/ACT inhaler Inhale 1 puff into the lungs 4 (four) times daily as needed for wheezing or shortness of breath.    [provider]  benzonatate (TESSALON) 100 MG capsule Take 1 capsule (100 mg total) by mouth every 8 (eight) hours. 05/09/23   Prosperi, Christian H, PA-C  busPIRone (BUSPAR) 10 MG tablet Take 1 tablet (10 mg total) by mouth 3 (three) times daily. 04/03/23   Lauro Franklin, MD  doxycycline (VIBRA-TABS) 100 MG tablet Take 1 tablet (100 mg total) by mouth every 12 (twelve) hours. 04/03/23   Lauro Franklin, MD  ferrous sulfate 325 (65 FE) MG tablet Take 1 tablet (325 mg total) by mouth at bedtime. Patient taking differently: Take 325 mg by mouth every other day. 04/14/22   Marylene Land, CNM  hydrOXYzine (ATARAX) 50 MG tablet Take 1 tablet (50 mg total) by mouth 3 (three) times daily as needed for anxiety. 03/14/23   Nwoko, Tommas Olp, PA  medroxyPROGESTERone Acetate 150 MG/ML SUSY Inject 150 mg into the muscle every 3 (three) months. 08/08/22   [provider]  metroNIDAZOLE (FLAGYL) 500 MG tablet Take 1 tablet (500 mg total) by mouth every 12 (twelve) hours. 04/03/23   Lauro Franklin, MD  nicotine (NICODERM CQ - DOSED IN MG/24 HOURS) 14 mg/24hr patch Place 1 patch (14 mg total) onto the skin daily at 6 (six) AM. 04/04/23   Pashayan, Mardelle Matte, MD  valACYclovir (VALTREX) 1000 MG tablet Take 1 tablet (1,000 mg total) by mouth 2 (two) times daily. 05/22/22   Thressa Sheller D, CNM  rizatriptan (MAXALT-MLT) 10 MG disintegrating tablet Take 1 tablet at onset of migraine with 2 ibuprofen may repeat an additional tablet in 2 hours if needed 05/07/20 06/09/20  Deetta Perla, MD  Allergies    Benadryl [diphenhydramine hcl], Haldol [haloperidol], Latex, Shellfish allergy, Zithromax [azithromycin], Camphor, Gluten meal, and Lactose intolerance (gi)    Review of Systems   Review of Systems  Physical Exam Updated Vital Signs BP 104/68 (BP Location: Right Arm)   Pulse 74   Temp 97.7 F (36.5 C) (Oral)   Resp 18   Ht 5\' 7"  (1.702 m)   Wt 111.1 kg   SpO2 100%   BMI 38.37 kg/m  Physical Exam Vitals reviewed.  Abdominal:     Tenderness: There is no abdominal tenderness.  Genitourinary:    Comments: Mild vaginal discharge but did have cervical motion tenderness.  Did have some adnexal tenderness  bilaterally. Musculoskeletal:     Cervical back: Neck supple.  Neurological:     Mental Status: She is alert.     ED Results / Procedures / Treatments   Labs (all labs ordered are listed, but only abnormal results are displayed) Labs Reviewed  WET PREP, GENITAL - Abnormal; Notable for the following components:      Result Value   Clue Cells Wet Prep HPF POC PRESENT (*)    All other components within normal limits  COMPREHENSIVE METABOLIC PANEL - Abnormal; Notable for the following components:   Glucose, Bld 106 (*)    AST 14 (*)    All other components within normal limits  URINALYSIS, ROUTINE W REFLEX MICROSCOPIC - Abnormal; Notable for the following components:   APPearance HAZY (*)    Leukocytes,Ua MODERATE (*)    Bacteria, UA RARE (*)    All other components within normal limits  URINALYSIS, W/ REFLEX TO CULTURE (INFECTION SUSPECTED) - Abnormal; Notable for the following components:   Leukocytes,Ua SMALL (*)    All other components within normal limits  LIPASE, BLOOD  CBC WITH DIFFERENTIAL/PLATELET  PREGNANCY, URINE  HIV ANTIBODY (ROUTINE TESTING W REFLEX)  RPR  GC/CHLAMYDIA PROBE AMP ([place]) NOT AT [facility]    EKG None  Radiology US Pelvis Complete  Result Date: 05/28/2023 CLINICAL DATA:  Pelvic pain EXAM: TRANSABDOMINAL AND TRANSVAGINAL ULTRASOUND OF PELVIS TECHNIQUE: Both transabdominal and transvaginal ultrasound examinations of the pelvis were performed. Transabdominal technique was performed for global imaging of the pelvis including uterus, ovaries, adnexal regions, and pelvic cul-de-sac. It was necessary to proceed with endovaginal exam following the transabdominal exam to visualize the . COMPARISON:  None Available. FINDINGS: Uterus Measurements: 7.6 x 4.7 x 4.6 cm = volume: 103.2 mL. Retroverted. No fibroids or other mass visualized. Endometrium Thickness: 3.8 mm.  No focal abnormality visualized. Right ovary Measurements: 2.3 x 1.1 x 2.3 cm = volume: 3.1 mL.  Normal appearance/no adnexal mass. Left ovary Measurements: 2.8 x 1.7 x 2.3 cm = volume: 4.9 mL. Normal appearance/no adnexal mass. Other findings No abnormal free fluid. IMPRESSION: No acute sonographic abnormality. Electronically Signed   By: Allegra Lai M.D.   On: 05/28/2023 11:15   US Transvaginal Non-OB  Result Date: 05/28/2023 CLINICAL DATA:  Pelvic pain EXAM: TRANSABDOMINAL AND TRANSVAGINAL ULTRASOUND OF PELVIS TECHNIQUE: Both transabdominal and transvaginal ultrasound examinations of the pelvis were performed. Transabdominal technique was performed for global imaging of the pelvis including uterus, ovaries, adnexal regions, and pelvic cul-de-sac. It was necessary to proceed with endovaginal exam following the transabdominal exam to visualize the . COMPARISON:  None Available. FINDINGS: Uterus Measurements: 7.6 x 4.7 x 4.6 cm = volume: 103.2 mL. Retroverted. No fibroids or other mass visualized. Endometrium Thickness: 3.8 mm.  No focal abnormality visualized. Right ovary  Measurements: 2.3 x 1.1 x 2.3 cm = volume: 3.1 mL. Normal appearance/no adnexal mass. Left ovary Measurements: 2.8 x 1.7 x 2.3 cm = volume: 4.9 mL. Normal appearance/no adnexal mass. Other findings No abnormal free fluid. IMPRESSION: No acute sonographic abnormality. Electronically Signed   By: Allegra Lai M.D.   On: 05/28/2023 11:15    Procedures Procedures    Medications Ordered in ED Medications - No data to display  ED Course/ Medical Decision Making/ A&P                                 Medical Decision Making Amount and/or Complexity of Data Reviewed Labs: ordered. Radiology: ordered.   Patient with dysuria and pelvic pain.  Vaginal discharge.  History of STDs and PID.  Benign abdominal exam.  Normal white count.  Urinalysis is somewhat contaminated with squamous cells visible.  Not pregnant.  Will do pelvic exam.  Will get HIV and RPR.  Reviewed previous ER visits for STD symptoms.  Pelvic exam did  show only mild discharge, but did have cervical motion tenderness.  Will get ultrasound to evaluate for tubo-ovarian abscesses.  Ultrasound reassuring.  Plan was to treat for PID.  I discussed with pharmacist and with previous vomiting were going to give IM Rocephin and oral azithromycin and 2 g of Flagyl.  However patient eloped before ultrasound had returned.        Final Clinical Impression(s) / ED Diagnoses Final diagnoses:  PID (pelvic inflammatory disease)    Rx / DC Orders ED Discharge Orders     None         Benjiman Core, MD 05/28/23 1131

## 2023-05-28 NOTE — ED Notes (Signed)
Pt and pt's belongings not in room. This RN called pt's listing mobile device. No answer.

## 2023-05-28 NOTE — ED Triage Notes (Signed)
Pt arrived POV d/t "still having vaginal and pelvic pain with nausea".  Pt was seen here recently but had to leav.  She wants to be tested for STDs.  Recent boyfriend had Chlamydia.

## 2023-05-29 LAB — GC/CHLAMYDIA PROBE AMP (~~LOC~~) NOT AT ARMC
Chlamydia: POSITIVE — AB
Comment: NEGATIVE
Comment: NORMAL
Neisseria Gonorrhea: NEGATIVE

## 2023-06-10 ENCOUNTER — Emergency Department (HOSPITAL_COMMUNITY)
Admission: EM | Admit: 2023-06-10 | Discharge: 2023-06-10 | Discharge status: Against Medical Advice | Payer: 59 | Attending: Emergency Medicine | Admitting: Emergency Medicine

## 2023-06-10 ENCOUNTER — Other Ambulatory Visit: Payer: Self-pay

## 2023-06-10 DIAGNOSIS — Z5321 Procedure and treatment not carried out due to patient leaving prior to being seen by health care provider: Secondary | ICD-10-CM | POA: Diagnosis not present

## 2023-06-10 DIAGNOSIS — R109 Unspecified abdominal pain: Secondary | ICD-10-CM | POA: Diagnosis not present

## 2023-06-10 LAB — CBC
HCT: 38.8 % (ref 36.0–46.0)
Hemoglobin: 12.9 g/dL (ref 12.0–15.0)
MCH: 26.5 pg (ref 26.0–34.0)
MCHC: 33.2 g/dL (ref 30.0–36.0)
MCV: 79.7 fL — ABNORMAL LOW (ref 80.0–100.0)
Platelets: 258 10*3/uL (ref 150–400)
RBC: 4.87 MIL/uL (ref 3.87–5.11)
RDW: 14.1 % (ref 11.5–15.5)
WBC: 10.6 10*3/uL — ABNORMAL HIGH (ref 4.0–10.5)
nRBC: 0 % (ref 0.0–0.2)

## 2023-06-10 LAB — COMPREHENSIVE METABOLIC PANEL
ALT: 13 U/L (ref 0–44)
AST: 14 U/L — ABNORMAL LOW (ref 15–41)
Albumin: 3.6 g/dL (ref 3.5–5.0)
Alkaline Phosphatase: 93 U/L (ref 38–126)
Anion gap: 15 (ref 5–15)
BUN: 9 mg/dL (ref 6–20)
CO2: 22 mmol/L (ref 22–32)
Calcium: 10.2 mg/dL (ref 8.9–10.3)
Chloride: 101 mmol/L (ref 98–111)
Creatinine, Ser: 0.91 mg/dL (ref 0.44–1.00)
GFR, Estimated: >60 mL/min (ref >60)
Glucose, Bld: 103 mg/dL — ABNORMAL HIGH (ref 70–99)
Potassium: 3.8 mmol/L (ref 3.5–5.1)
Sodium: 138 mmol/L (ref 135–145)
Total Bilirubin: 0.5 mg/dL (ref 0.3–1.2)
Total Protein: 7.2 g/dL (ref 6.5–8.1)

## 2023-06-10 LAB — URINALYSIS, ROUTINE W REFLEX MICROSCOPIC
Bilirubin Urine: NEGATIVE
Glucose, UA: NEGATIVE mg/dL
Hgb urine dipstick: NEGATIVE
Ketones, ur: NEGATIVE mg/dL
Nitrite: NEGATIVE
Protein, ur: NEGATIVE mg/dL
Specific Gravity, Urine: 1.01 (ref 1.005–1.030)
pH: 5 (ref 5.0–8.0)

## 2023-06-10 LAB — HCG, SERUM, QUALITATIVE: Preg, Serum: NEGATIVE

## 2023-06-10 LAB — LIPASE, BLOOD: Lipase: 29 U/L (ref 11–51)

## 2023-06-10 NOTE — ED Notes (Signed)
Pt called in lobby for room x3 with no response. Moved OTF.

## 2023-06-10 NOTE — ED Triage Notes (Signed)
Patient reports mid abdominal pain this morning , denies emesis or diarrhea , no fever or chills .

## 2023-06-14 ENCOUNTER — Ambulatory Visit (HOSPITAL_COMMUNITY): Payer: 59

## 2023-07-13 ENCOUNTER — Ambulatory Visit (HOSPITAL_COMMUNITY)
Admission: EM | Admit: 2023-07-13 | Discharge: 2023-07-13 | Disposition: A | Discharge status: Home / Self Care | Payer: 59 | Attending: Nurse Practitioner | Admitting: Nurse Practitioner

## 2023-07-13 DIAGNOSIS — F17210 Nicotine dependence, cigarettes, uncomplicated: Secondary | ICD-10-CM | POA: Insufficient documentation

## 2023-07-13 DIAGNOSIS — Z91148 Patient's other noncompliance with medication regimen for other reason: Secondary | ICD-10-CM | POA: Diagnosis not present

## 2023-07-13 DIAGNOSIS — Z9151 Personal history of suicidal behavior: Secondary | ICD-10-CM | POA: Insufficient documentation

## 2023-07-13 DIAGNOSIS — Z5901 Sheltered homelessness: Secondary | ICD-10-CM | POA: Diagnosis not present

## 2023-07-13 DIAGNOSIS — F3162 Bipolar disorder, current episode mixed, moderate: Secondary | ICD-10-CM | POA: Diagnosis not present

## 2023-07-13 DIAGNOSIS — Z79899 Other long term (current) drug therapy: Secondary | ICD-10-CM | POA: Diagnosis not present

## 2023-07-13 LAB — HEMOGLOBIN A1C
Hgb A1c MFr Bld: 5.6 % (ref 4.8–5.6)
Mean Plasma Glucose: 114.02 mg/dL

## 2023-07-13 LAB — CBC WITH DIFFERENTIAL/PLATELET
Abs Immature Granulocytes: 0.03 10*3/uL (ref 0.00–0.07)
Basophils Absolute: 0 10*3/uL (ref 0.0–0.1)
Basophils Relative: 1 %
Eosinophils Absolute: 0.2 10*3/uL (ref 0.0–0.5)
Eosinophils Relative: 2 %
HCT: 41.6 % (ref 36.0–46.0)
Hemoglobin: 13.6 g/dL (ref 12.0–15.0)
Immature Granulocytes: 0 %
Lymphocytes Relative: 37 %
Lymphs Abs: 3.2 10*3/uL (ref 0.7–4.0)
MCH: 26.5 pg (ref 26.0–34.0)
MCHC: 32.7 g/dL (ref 30.0–36.0)
MCV: 80.9 fL (ref 80.0–100.0)
Monocytes Absolute: 0.6 10*3/uL (ref 0.1–1.0)
Monocytes Relative: 7 %
Neutro Abs: 4.7 10*3/uL (ref 1.7–7.7)
Neutrophils Relative %: 53 %
Platelets: 254 10*3/uL (ref 150–400)
RBC: 5.14 MIL/uL — ABNORMAL HIGH (ref 3.87–5.11)
RDW: 14.2 % (ref 11.5–15.5)
WBC: 8.8 10*3/uL (ref 4.0–10.5)
nRBC: 0 % (ref 0.0–0.2)

## 2023-07-13 LAB — COMPREHENSIVE METABOLIC PANEL
ALT: 12 U/L (ref 0–44)
AST: 13 U/L — ABNORMAL LOW (ref 15–41)
Albumin: 3.5 g/dL (ref 3.5–5.0)
Alkaline Phosphatase: 93 U/L (ref 38–126)
Anion gap: 7 (ref 5–15)
BUN: 6 mg/dL (ref 6–20)
CO2: 23 mmol/L (ref 22–32)
Calcium: 9.1 mg/dL (ref 8.9–10.3)
Chloride: 105 mmol/L (ref 98–111)
Creatinine, Ser: 0.75 mg/dL (ref 0.44–1.00)
GFR, Estimated: >60 mL/min (ref >60)
Glucose, Bld: 83 mg/dL (ref 70–99)
Potassium: 3.9 mmol/L (ref 3.5–5.1)
Sodium: 135 mmol/L (ref 135–145)
Total Bilirubin: 0.4 mg/dL (ref <1.2)
Total Protein: 6.5 g/dL (ref 6.5–8.1)

## 2023-07-13 LAB — POCT URINE DRUG SCREEN - MANUAL ENTRY (I-SCREEN)
POC Amphetamine UR: NOT DETECTED
POC Buprenorphine (BUP): NOT DETECTED
POC Cocaine UR: NOT DETECTED
POC Marijuana UR: NOT DETECTED
POC Methadone UR: NOT DETECTED
POC Methamphetamine UR: NOT DETECTED
POC Morphine: NOT DETECTED
POC Oxazepam (BZO): NOT DETECTED
POC Oxycodone UR: NOT DETECTED
POC Secobarbital (BAR): NOT DETECTED

## 2023-07-13 LAB — ETHANOL: Alcohol, Ethyl (B): <10 mg/dL (ref <10)

## 2023-07-13 LAB — POC URINE PREG, ED: Preg Test, Ur: NEGATIVE

## 2023-07-13 MED ORDER — NICOTINE 21 MG/24HR TD PT24
21.0000 mg | MEDICATED_PATCH | Freq: Every day | TRANSDERMAL | Status: DC
  Administered 2023-07-13: 21 mg via TRANSDERMAL
  Filled 2023-07-13: qty 1

## 2023-07-13 MED ORDER — NICOTINE 21 MG/24HR TD PT24: 21.0000 mg | MEDICATED_PATCH | Freq: Every day | TRANSDERMAL | 0 refills | Status: DC

## 2023-07-13 MED ORDER — ALUM & MAG HYDROXIDE-SIMETH 200-200-20 MG/5ML PO SUSP: 15.0000 mL | Freq: Four times a day (QID) | ORAL | Status: DC | PRN

## 2023-07-13 MED ORDER — RISPERIDONE 1 MG PO TABS: 1.0000 mg | ORAL_TABLET | Freq: Two times a day (BID) | ORAL | 0 refills | Status: DC

## 2023-07-13 MED ORDER — ACETAMINOPHEN 325 MG PO TABS: 650.0000 mg | ORAL_TABLET | Freq: Four times a day (QID) | ORAL | Status: DC | PRN

## 2023-07-13 MED ORDER — HYDROXYZINE HCL 25 MG PO TABS: 25.0000 mg | ORAL_TABLET | Freq: Three times a day (TID) | ORAL | Status: DC | PRN

## 2023-07-13 MED ORDER — RISPERIDONE 1 MG PO TABS
1.0000 mg | ORAL_TABLET | Freq: Two times a day (BID) | ORAL | Status: DC
  Administered 2023-07-13: 1 mg via ORAL
  Filled 2023-07-13: qty 1

## 2023-07-13 MED ORDER — BUSPIRONE HCL 5 MG PO TABS: 5.0000 mg | ORAL_TABLET | Freq: Two times a day (BID) | ORAL | 0 refills | Status: DC

## 2023-07-13 MED ORDER — MAGNESIUM HYDROXIDE 400 MG/5ML PO SUSP: 15.0000 mL | Freq: Every day | ORAL | Status: DC | PRN

## 2023-07-13 MED ORDER — BUSPIRONE HCL 5 MG PO TABS
5.0000 mg | ORAL_TABLET | Freq: Two times a day (BID) | ORAL | Status: DC
  Administered 2023-07-13: 5 mg via ORAL
  Filled 2023-07-13: qty 1

## 2023-07-13 NOTE — ED Notes (Signed)
Patient observed resting quietly, eyes closed. Respirations equal and unlabored. Will continue to monitor for safety.  

## 2023-07-13 NOTE — ED Notes (Signed)
Patient discharged in no acute distress. AVS reviewed and belongings returned from locker #23. Patient escorted to the front lobby by staff. Bus pass provided. Safety maintained.

## 2023-07-13 NOTE — ED Provider Notes (Signed)
FBC/OBS ASAP Discharge Summary  Date and Time: 07/13/2023 10:34 AM  Name: Melissa [name]  MRN:  [id number]   Discharge Diagnoses:  Final diagnoses:  Bipolar 1 disorder, mixed, moderate ([name])  Non compliance w medication regimen  Sheltered homelessness    Subjective: Patient is a 22 year old admitted for observation to continuous assessment. Today on the evaluation the patient denied  thoughts of SI, HI and auditory hallucinations. Patient states she slept well. Patient states she has a good appetite. Patient states her mood is stressed out because of her CPS case and losing her children to her mother. Patient states that due to CPS case she cannot stay with her kids and is homeless.  Stay Summary: Melissa [name] is a 22 year old female with a psychiatric history of MDD, and GAD. The patient [facility]) on 07/13/2023 endorsing SI with a plan, reporting auditory hallucination asking her to hurt herself or others. Patient was admitted to [facility] continuous observation and restarted on home medications Buspar 5mg  BID and Risperdal 1mg  BID. Patient tolerated medications well over night.Upon morning assessment Patient states that she no longer has SI and auditory hallucinations are no longer present. Patient will be discharged home with medication refill for 14 days.  Consulted with Dr. Lucianne Muss and patient is appropriate for discharge.   Total Time spent with patient: 30 minutes  Past Psychiatric History:  History of MDD, and GAD Past Medical History: Past medical history of seizures ( stopped medication in 2020 due to pregnancy) Family History: No family history reported Family Psychiatric History: No family psychiatric reported Social History: Melissa [name] is the mother of 2 children 47, and 32 years old. Melissa [name] has lost custody of children to her mother. Due to DSS case against her she cannot live in the home with her children. Tobacco Cessation:  A  prescription for an FDA-approved tobacco cessation medication provided at discharge  Current Medications:  Current Facility-Administered Medications  Medication Dose Route Frequency Provider Last Rate Last Admin   acetaminophen (TYLENOL) tablet 650 mg  650 mg Oral Q6H PRN Onuoha, Chinwendu V, NP       alum & mag hydroxide-simeth (MAALOX/MYLANTA) 200-200-20 MG/5ML suspension 15 mL  15 mL Oral Q6H PRN Onuoha, Chinwendu V, NP       busPIRone (BUSPAR) tablet 5 mg  5 mg Oral BID Onuoha, Chinwendu V, NP   5 mg at 07/13/23 0944   hydrOXYzine (ATARAX) tablet 25 mg  25 mg Oral TID PRN Onuoha, Chinwendu V, NP       magnesium hydroxide (MILK OF MAGNESIA) suspension 15 mL  15 mL Oral Daily PRN Onuoha, Chinwendu V, NP       nicotine (NICODERM CQ - dosed in mg/24 hours) patch 21 mg  21 mg Transdermal Q0600 Onuoha, Chinwendu V, NP   21 mg at 07/13/23 0944   risperiDONE (RISPERDAL) tablet 1 mg  1 mg Oral BID Onuoha, Chinwendu V, NP   1 mg at 07/13/23 0944   Current Outpatient Medications  Medication Sig Dispense Refill   busPIRone (BUSPAR) 7.5 MG tablet Take 7.5 mg by mouth 3 (three) times daily.     albuterol (VENTOLIN HFA) 108 (90 Base) MCG/ACT inhaler Inhale 1 puff into the lungs 4 (four) times daily as needed for wheezing or shortness of breath.     ferrous sulfate 325 (65 FE) MG tablet Take 1 tablet (325 mg total) by mouth at bedtime. (Patient taking differently: Take 325 mg by  mouth every other day.) 30 tablet 3   hydrOXYzine (ATARAX) 50 MG tablet Take 1 tablet (50 mg total) by mouth 3 (three) times daily as needed for anxiety. 75 tablet 1   medroxyPROGESTERone Acetate 150 MG/ML SUSY Inject 150 mg into the muscle every 3 (three) months.      PTA Medications:  Facility Ordered Medications  Medication   acetaminophen (TYLENOL) tablet 650 mg   alum & mag hydroxide-simeth (MAALOX/MYLANTA) 200-200-20 MG/5ML suspension 15 mL   magnesium hydroxide (MILK OF MAGNESIA) suspension 15 mL   hydrOXYzine (ATARAX)  tablet 25 mg   nicotine (NICODERM CQ - dosed in mg/24 hours) patch 21 mg   busPIRone (BUSPAR) tablet 5 mg   risperiDONE (RISPERDAL) tablet 1 mg   PTA Medications  Medication Sig   busPIRone (BUSPAR) 7.5 MG tablet Take 7.5 mg by mouth 3 (three) times daily.   ferrous sulfate 325 (65 FE) MG tablet Take 1 tablet (325 mg total) by mouth at bedtime. (Patient taking differently: Take 325 mg by mouth every other day.)   medroxyPROGESTERone Acetate 150 MG/ML SUSY Inject 150 mg into the muscle every 3 (three) months.   albuterol (VENTOLIN HFA) 108 (90 Base) MCG/ACT inhaler Inhale 1 puff into the lungs 4 (four) times daily as needed for wheezing or shortness of breath.   hydrOXYzine (ATARAX) 50 MG tablet Take 1 tablet (50 mg total) by mouth 3 (three) times daily as needed for anxiety.       03/14/2023    9:21 AM 01/24/2023    8:32 AM 04/07/2022   10:55 AM  Depression screen PHQ 2/9  Decreased Interest 1 2 1   Down, Depressed, Hopeless 2 1 1   PHQ - 2 Score 3 3 2   Altered sleeping 1 3 0  Tired, decreased energy 3 2 1   Change in appetite 2 3 0  Feeling bad or failure about yourself  1 3 0  Trouble concentrating 1 2 1   Moving slowly or fidgety/restless 1 2 1   Suicidal thoughts 1 1 0  PHQ-9 Score 13 19 5   Difficult doing work/chores Not difficult at all Somewhat difficult     Flowsheet Row ED from 07/13/2023 in [facility] ED from 06/10/2023 in [facility] Emergency Department at [facility] ED from 05/28/2023 in [facility] Emergency Department at [facility]  C-SSRS RISK CATEGORY Low Risk No Risk Error: Q3, 4, or 5 should not be populated when Q2 is No       Musculoskeletal  Strength & Muscle Tone: within normal limits Gait & Station: normal Patient leans: N/A  Psychiatric Specialty Exam  Presentation  General Appearance:  Casual  Eye Contact: Poor  Speech: Slow; Clear and Coherent  Speech  Volume: Decreased  Handedness: Right   Mood and Affect  Mood: Depressed  Affect: Flat   Thought Process  Thought Processes: Coherent  Descriptions of Associations:Intact  Orientation:Full (Time, Place and Person)  Thought Content:WDL  Diagnosis of Schizophrenia or Schizoaffective disorder in past: No    Hallucinations:Hallucinations: Auditory Description of Auditory Hallucinations: Pt reorts hearing voices asking her to hurt herself or others  Ideas of Reference:None  Suicidal Thoughts:Suicidal Thoughts: Yes, Passive SI Passive Intent and/or Plan: Without Plan  Homicidal Thoughts:Homicidal Thoughts: No   Sensorium  Memory: Immediate Fair  Judgment: Poor  Insight: Lacking   Executive Functions  Concentration: Poor  Attention Span: Poor  Recall: Poor  Fund of Knowledge: Poor  Language: Fair   Lexicographer  Activity: Psychomotor Activity: Normal   Assets  Assets: Communication Skills; Desire for Improvement   Sleep  Sleep: Sleep: Fair   Nutritional Assessment (For OBS and FBC admissions only) Has the patient had a weight loss or gain of 10 pounds or more in the last 3 months?: No Has the patient had a decrease in food intake/or appetite?: No Does the patient have dental problems?: No Does the patient have eating habits or behaviors that may be indicators of an eating disorder including binging or inducing vomiting?: No Has the patient recently lost weight without trying?: 0 Has the patient been eating poorly because of a decreased appetite?: 0 Malnutrition Screening Tool Score: 0    Physical Exam  Physical Exam HENT:     Head: Normocephalic.  Eyes:     Extraocular Movements: Extraocular movements intact.  Pulmonary:     Breath sounds: Normal breath sounds.  Musculoskeletal:        General: Normal range of motion.     Cervical back: Normal range of motion.  Skin:    General: Skin is warm.   Neurological:     Mental Status: She is alert and oriented to person, place, and time.  Psychiatric:        Behavior: Behavior normal.    Review of Systems  Constitutional: Negative.   HENT: Negative.    Eyes: Negative.   Respiratory: Negative.    Cardiovascular: Negative.   Gastrointestinal: Negative.   Genitourinary: Negative.   Musculoskeletal: Negative.   Skin: Negative.   Neurological: Negative.   Endo/Heme/Allergies: Negative.   Psychiatric/Behavioral: Negative.     Blood pressure 115/83, pulse 92, temperature 97.7 F (36.5 C), temperature source Oral, resp. rate 18, SpO2 100%, not currently breastfeeding. There is no height or weight on file to calculate BMI.  Demographic Factors:  Adolescent or young adult, Low socioeconomic status, and Unemployed  Loss Factors: Legal issues  Historical Factors: NA  Risk Reduction Factors:   NA  Continued Clinical Symptoms:  Depression:   Anhedonia  Cognitive Features That Contribute To Risk:  None    Suicide Risk:  Minimal: No identifiable suicidal ideation.  Patients presenting with no risk factors but with morbid ruminations; may be classified as minimal risk based on the severity of the depressive symptoms  Plan Of Care/Follow-up recommendations:  Melissa [name] discharged with a 14 day supply of Buspar and Hydroxyzine.  PTA Medications  Medication Sig   busPIRone (BUSPAR) 7.5 MG tablet Take 7.5 mg by mouth 3 (three) times daily.               hydrOXYzine (ATARAX) 50 MG tablet Take 1 tablet (50 mg total) by mouth 3 (three) times daily as needed for anxiety.    Disposition: Discharge  De Burrs, NP 07/13/2023, 10:34 AM

## 2023-07-13 NOTE — ED Notes (Signed)
Patient resting quietly in the lounge chair. Awaiting resources from SW before pt leaves. Will continue to monitor for safety.

## 2023-07-13 NOTE — ED Notes (Signed)
AVS review with patient. Understanding voiced. Pt states she will call for transportation.

## 2023-07-13 NOTE — ED Provider Notes (Signed)
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  Patient was seen face to face by this provider and chart reviewed. Per chart review, patient was last admitted inpatient at the [facility] between 03/29/23-04/03/23 due to worsening SI with a plan to jump off a bridge.1 Suicide Attempt (OD- 2015) and 3 Prior inpatient Psychiatric Hospitalizations.   On evaluation, patient is drowsy, oriented at baseline, and cooperative. Speech is clear, slow and coherent. Pt appears casually dressed. Eye contact is poor. Mood is depressed, affect is flat and congruent with mood. Thought process is coherent and thought content is WDL Pt endorses passive SI, no plan, denies HI/, endorses AH, denies VH. There is no objective indication that the patient is responding to internal stimuli. No delusions elicited during this assessment.    Patient is drowsy, inattentive but coherent and endorses drinking a big single bottle of wine cooler at 12 midnight and smoking some cigarettes. Denies other illicit substance use.and reports being "up/awake gambling for long periods".    She reports " I didn't take drugs, I've just being up gambling with no sleep or food".  She is  unemployed and reports she is financially supported by her friends and mom.   Patient also reports being off her medications for four months and states  "I'm feeling like I'm going into a manic episode and  I keep hearing voices telling me to hurt myself and others".  Patient endorses she is partially suicidal with no plan for a week..   Pt endorses drinking "every other day, a big bottle of wine cooler, last drank at midnight. She also endorses smoking about a pack of cigarettes daily.  She reports staying with her friends off/on with no home of her own.  She states she is prescribed Risperdal, Buspar and atarax.  She was last seen at the [facility] outpatient clinic for medication management in July/2024. She currently is not seeing a therapist.  Support, encouragement and reassurance provided about ongoing stressors. Patient is provided with opportunity for questions.   Discussed recommendation for admission to continuous observation unit for safety monitoring overnight and re-eval in am for SI/HI/AVH or paranoia.  Patient verbalized her understanding and is in agreement.   Total Time spent with patient: 20 minutes  Musculoskeletal  Strength & Muscle Tone: within normal limits Gait & Station: normal Patient leans: N/A  Psychiatric Specialty Exam  Presentation General Appearance:  Casual  Eye Contact: Poor  Speech: Slow; Clear and Coherent  Speech Volume: Decreased  Handedness: Right   Mood and Affect  Mood: Depressed  Affect: Flat   Thought Process  Thought Processes: Coherent  Descriptions of Associations:Intact  Orientation:Full (Time, Place and Person)  Thought Content:WDL  Diagnosis of Schizophrenia or Schizoaffective disorder in past: No data recorded  Hallucinations:Hallucinations: Auditory Description of Auditory Hallucinations: Pt  reorts hearing voices asking her to hurt herself or others  Ideas of Reference:None  Suicidal Thoughts:Suicidal  Thoughts: Yes, Passive SI Passive Intent and/or Plan: Without Plan  Homicidal Thoughts:Homicidal Thoughts: No   Sensorium  Memory: Immediate Fair  Judgment: Poor  Insight: Lacking   Executive Functions  Concentration: Poor  Attention Span: Poor  Recall: Poor  Fund of Knowledge: Poor  Language: Fair   Psychomotor Activity  Psychomotor Activity: Psychomotor Activity: Normal   Assets  Assets: Communication Skills; Desire for Improvement   Sleep  Sleep: Sleep: Fair   Nutritional Assessment (For OBS and FBC admissions only) Has the patient had a weight loss or gain of 10 pounds or more in the last 3 months?: No Has the patient had a decrease in food intake/or appetite?: No Does the patient have dental problems?: No Does the patient have eating habits or behaviors that may be indicators of an eating disorder including binging or inducing vomiting?: No Has the patient recently lost weight without trying?: 0 Has the patient been eating poorly because of a decreased appetite?: 0 Malnutrition Screening Tool Score: 0    Physical Exam Constitutional:      General: She is not in acute distress.    Appearance: She is not diaphoretic.  HENT:     Right Ear: External ear normal.     Left Ear: External ear normal.     Nose: No congestion.  Eyes:     General:        Right eye: No discharge.        Left eye: No discharge.  Cardiovascular:     Rate and Rhythm: Normal rate.  Pulmonary:     Effort: No respiratory distress.  Chest:     Chest wall: No tenderness.  Neurological:     Mental Status: Mental status is at baseline.  Psychiatric:        Attention and Perception: She is inattentive. She perceives auditory hallucinations.        Mood and Affect: Mood is depressed. Affect is flat.        Speech: Speech is delayed.        Behavior: Behavior is cooperative.        Thought Content: Thought content is not paranoid or delusional. Thought content includes  suicidal ideation. Thought content does not include homicidal ideation. Thought content does not include homicidal or suicidal plan.    Review of Systems  Constitutional:  Negative for chills, diaphoresis and fever.  HENT:  Negative for congestion.   Eyes:  Negative for discharge.  Respiratory:  Negative for cough, shortness of breath and wheezing.   Cardiovascular:  Negative for chest pain and palpitations.  Gastrointestinal:  Negative for constipation, nausea and vomiting.  Neurological:  Negative for dizziness, seizures, loss of consciousness and weakness.  Psychiatric/Behavioral:  Positive for hallucinations and substance abuse.     Blood pressure (P) 115/83, pulse (P) 92, temperature (P) 97.6 F (36.4 C), temperature source (P) Oral, resp. rate (P) 18, SpO2 (P) 100%, not currently breastfeeding. There is no height or weight on file to calculate BMI.  Past Psychiatric History: See H & P   Is the patient at risk to self? Yes  Has the patient been a risk to self in the past 6 months? Yes .    Has the patient been a risk to self within the distant past? Yes   Is the patient a risk to others? Yes   Has the patient been a  risk to others in the past 6 months?  unknown   Has the patient been a risk to others within the distant past?  unknown  Past Medical History: See Chart  Family History: N/A  Social History: N/A  Last Labs:  Admission on 06/10/2023, Discharged on 06/10/2023  Component Date Value Ref Range Status   Lipase 06/10/2023 29  11 - 51 U/L Final   Performed at [facility] Lab, [address], [place], [place] [address]   Sodium 06/10/2023 138  135 - 145 mmol/L Final   Potassium 06/10/2023 3.8  3.5 - 5.1 mmol/L Final   Chloride 06/10/2023 101  98 - 111 mmol/L Final   CO2 06/10/2023 22  22 - 32 mmol/L Final   Glucose, Bld 06/10/2023 103 (H)  70 - 99 mg/dL Final   Glucose reference range applies only to samples taken after fasting for at least 8 hours.   BUN  06/10/2023 9  6 - 20 mg/dL Final   Creatinine, Ser 06/10/2023 0.91  0.44 - 1.00 mg/dL Final   Calcium 60/45/4098 10.2  8.9 - 10.3 mg/dL Final   Total Protein 11/91/4782 7.2  6.5 - 8.1 g/dL Final   Albumin 95/62/1308 3.6  3.5 - 5.0 g/dL Final   AST 65/78/4696 14 (L)  15 - 41 U/L Final   ALT 06/10/2023 13  0 - 44 U/L Final   Alkaline Phosphatase 06/10/2023 93  38 - 126 U/L Final   Total Bilirubin 06/10/2023 0.5  0.3 - 1.2 mg/dL Final   GFR, Estimated 06/10/2023 >60  >60 mL/min Final   Comment: (NOTE) Calculated using the CKD-EPI Creatinine Equation (2021)    Anion gap 06/10/2023 15  5 - 15 Final   Performed at [facility] Lab, [address], [place], [place] [address]   WBC 06/10/2023 10.6 (H)  4.0 - 10.5 K/uL Final   RBC 06/10/2023 4.87  3.87 - 5.11 MIL/uL Final   Hemoglobin 06/10/2023 12.9  12.0 - 15.0 g/dL Final   HCT 41/32/4401 38.8  36.0 - 46.0 % Final   MCV 06/10/2023 79.7 (L)  80.0 - 100.0 fL Final   MCH 06/10/2023 26.5  26.0 - 34.0 pg Final   MCHC 06/10/2023 33.2  30.0 - 36.0 g/dL Final   RDW 02/72/5366 14.1  11.5 - 15.5 % Final   Platelets 06/10/2023 258  150 - 400 K/uL Final   nRBC 06/10/2023 0.0  0.0 - 0.2 % Final   Performed at [facility] Lab, [address] [place], [place] [address]   Color, Urine 06/10/2023 YELLOW  YELLOW Final   APPearance 06/10/2023 CLEAR  CLEAR Final   Specific Gravity, Urine 06/10/2023 1.010  1.005 - 1.030 Final   pH 06/10/2023 5.0  5.0 - 8.0 Final   Glucose, UA 06/10/2023 NEGATIVE  NEGATIVE mg/dL Final   Hgb urine dipstick 06/10/2023 NEGATIVE  NEGATIVE Final   Bilirubin Urine 06/10/2023 NEGATIVE  NEGATIVE Final   Ketones, ur 06/10/2023 NEGATIVE  NEGATIVE mg/dL Final   Protein, ur 74/25/9563 NEGATIVE  NEGATIVE mg/dL Final   Nitrite 87/56/4332 NEGATIVE  NEGATIVE Final   Leukocytes,Ua 06/10/2023 MODERATE (A)  NEGATIVE Final   RBC / HPF 06/10/2023 0-5  0 - 5 RBC/hpf Final   WBC, UA 06/10/2023 6-10  0 - 5 WBC/hpf Final   Bacteria, UA  06/10/2023 RARE (A)  NONE SEEN Final   Squamous Epithelial / HPF 06/10/2023 0-5  0 - 5 /HPF Final   Performed at [facility]  Lab, [address], [place], [place] [address]   Preg, Serum 06/10/2023 NEGATIVE  NEGATIVE Final   Comment:        THE SENSITIVITY OF THIS METHODOLOGY IS >10 mIU/mL. Performed at [facility] Lab, [address] [place], [place] [address]   Admission on 05/28/2023, Discharged on 05/28/2023  Component Date Value Ref Range Status   Sodium 05/28/2023 136  135 - 145 mmol/L Final   Potassium 05/28/2023 3.6  3.5 - 5.1 mmol/L Final   Chloride 05/28/2023 101  98 - 111 mmol/L Final   CO2 05/28/2023 24  22 - 32 mmol/L Final   Glucose, Bld 05/28/2023 106 (H)  70 - 99 mg/dL Final   Glucose reference range applies only to samples taken after fasting for at least 8 hours.   BUN 05/28/2023 10  6 - 20 mg/dL Final   Creatinine, Ser 05/28/2023 0.92  0.44 - 1.00 mg/dL Final   Calcium 29/56/2130 9.2  8.9 - 10.3 mg/dL Final   Total Protein 86/57/8469 7.4  6.5 - 8.1 g/dL Final   Albumin 62/95/2841 3.6  3.5 - 5.0 g/dL Final   AST 32/44/0102 14 (L)  15 - 41 U/L Final   ALT 05/28/2023 14  0 - 44 U/L Final   Alkaline Phosphatase 05/28/2023 95  38 - 126 U/L Final   Total Bilirubin 05/28/2023 0.4  0.3 - 1.2 mg/dL Final   GFR, Estimated 05/28/2023 >60  >60 mL/min Final   Comment: (NOTE) Calculated using the CKD-EPI Creatinine Equation (2021)    Anion gap 05/28/2023 11  5 - 15 Final   Performed at [facility], [address], [place], [place] [address]   Lipase 05/28/2023 30  11 - 51 U/L Final   Performed at [facility] Lab, [address], [place], [place] [address]   WBC 05/28/2023 8.0  4.0 - 10.5 K/uL Final   RBC 05/28/2023 4.72  3.87 - 5.11 MIL/uL Final   Hemoglobin 05/28/2023 12.8  12.0 - 15.0 g/dL Final   HCT 47/42/5956 38.7  36.0 - 46.0 % Final   MCV 05/28/2023 82.0  80.0 - 100.0 fL Final   MCH 05/28/2023 27.1  26.0 - 34.0 pg Final   MCHC 05/28/2023 33.1  30.0  - 36.0 g/dL Final   RDW 38/75/6433 14.0  11.5 - 15.5 % Final   Platelets 05/28/2023 262  150 - 400 K/uL Final   nRBC 05/28/2023 0.0  0.0 - 0.2 % Final   Neutrophils Relative % 05/28/2023 48  % Final   Neutro Abs 05/28/2023 3.9  1.7 - 7.7 K/uL Final   Lymphocytes Relative 05/28/2023 39  % Final   Lymphs Abs 05/28/2023 3.2  0.7 - 4.0 K/uL Final   Monocytes Relative 05/28/2023 9  % Final   Monocytes Absolute 05/28/2023 0.7  0.1 - 1.0 K/uL Final   Eosinophils Relative 05/28/2023 3  % Final   Eosinophils Absolute 05/28/2023 0.2  0.0 - 0.5 K/uL Final   Basophils Relative 05/28/2023 0  % Final   Basophils Absolute 05/28/2023 0.0  0.0 - 0.1 K/uL Final   Immature Granulocytes 05/28/2023 1  % Final   Abs Immature Granulocytes 05/28/2023 0.04  0.00 - 0.07 K/uL Final   Performed at [facility] Lab, [address] [place], [place] [address]   Color, Urine 05/28/2023 YELLOW  YELLOW Final   APPearance 05/28/2023 HAZY (A)  CLEAR Final   Specific Gravity, Urine 05/28/2023 1.006  1.005 - 1.030 Final  pH 05/28/2023 6.0  5.0 - 8.0 Final   Glucose, UA 05/28/2023 NEGATIVE  NEGATIVE mg/dL Final   Hgb urine dipstick 05/28/2023 NEGATIVE  NEGATIVE Final   Bilirubin Urine 05/28/2023 NEGATIVE  NEGATIVE Final   Ketones, ur 05/28/2023 NEGATIVE  NEGATIVE mg/dL Final   Protein, ur 16/05/9603 NEGATIVE  NEGATIVE mg/dL Final   Nitrite 54/04/8118 NEGATIVE  NEGATIVE Final   Leukocytes,Ua 05/28/2023 MODERATE (A)  NEGATIVE Final   RBC / HPF 05/28/2023 0-5  0 - 5 RBC/hpf Final   WBC, UA 05/28/2023 6-10  0 - 5 WBC/hpf Final   Bacteria, UA 05/28/2023 RARE (A)  NONE SEEN Final   Squamous Epithelial / HPF 05/28/2023 11-20  0 - 5 /HPF Final   Performed at [facility], [address], [place], [place] [address]   Preg Test, Ur 05/28/2023 NEGATIVE  NEGATIVE Final   Comment:        THE SENSITIVITY OF THIS METHODOLOGY IS >25 mIU/mL. Performed at [facility], [address], [place], [place] [address]     Specimen Source 05/28/2023 URINE, CLEAN CATCH   Final   Color, Urine 05/28/2023 YELLOW  YELLOW Final   APPearance 05/28/2023 CLEAR  CLEAR Final   Specific Gravity, Urine 05/28/2023 1.005  1.005 - 1.030 Final   pH 05/28/2023 6.0  5.0 - 8.0 Final   Glucose, UA 05/28/2023 NEGATIVE  NEGATIVE mg/dL Final   Hgb urine dipstick 05/28/2023 NEGATIVE  NEGATIVE Final   Bilirubin Urine 05/28/2023 NEGATIVE  NEGATIVE Final   Ketones, ur 05/28/2023 NEGATIVE  NEGATIVE mg/dL Final   Protein, ur 30/86/5784 NEGATIVE  NEGATIVE mg/dL Final   Nitrite 69/62/9528 NEGATIVE  NEGATIVE Final   Leukocytes,Ua 05/28/2023 SMALL (A)  NEGATIVE Final   RBC / HPF 05/28/2023 0-5  0 - 5 RBC/hpf Final   WBC, UA 05/28/2023 6-10  0 - 5 WBC/hpf Final   Comment:        Reflex urine culture not performed if WBC <=10, OR if Squamous epithelial cells >5. If Squamous epithelial cells >5 suggest recollection.    Bacteria, UA 05/28/2023 NONE SEEN  NONE SEEN Final   Squamous Epithelial / HPF 05/28/2023 6-10  0 - 5 /HPF Final   Performed at [facility], [address] [place], [place] [address]   Neisseria Gonorrhea 05/28/2023 Negative   Final   Chlamydia 05/28/2023 Positive (A)   Final   Comment 05/28/2023 Normal Reference Ranger Chlamydia - Negative   Final   Comment 05/28/2023 Normal Reference Range Neisseria Gonorrhea - Negative   Final   RPR Ser Ql 05/28/2023 NON REACTIVE  NON REACTIVE Final   Performed at [facility], [address] [place], [place] [address]   HIV Screen 4th Generation wRfx 05/28/2023 Non Reactive  Non Reactive Final   Performed at [facility], [address] [place], [place] [address]   Yeast Wet Prep HPF POC 05/28/2023 NONE SEEN  NONE SEEN Final   Trich, Wet Prep 05/28/2023 NONE SEEN  NONE SEEN Final   Clue Cells Wet Prep HPF POC 05/28/2023 PRESENT (A)  NONE SEEN Final   WBC, Wet Prep HPF POC 05/28/2023 <10  <10 Final   Sperm 05/28/2023 NONE SEEN   Final   Performed at [facility] Lab, [address], [place], [place] [address]  Admission on 05/15/2023, Discharged on 05/15/2023  Component Date Value Ref Range Status   Preg Test, Ur 05/15/2023 NEGATIVE  NEGATIVE Final  Comment:        THE SENSITIVITY OF THIS METHODOLOGY IS >25 mIU/mL. Performed at [facility] Lab, [address] [place], [place] [address]    Color, Urine 05/15/2023 YELLOW  YELLOW Final   APPearance 05/15/2023 HAZY (A)  CLEAR Final   Specific Gravity, Urine 05/15/2023 1.006  1.005 - 1.030 Final   pH 05/15/2023 6.0  5.0 - 8.0 Final   Glucose, UA 05/15/2023 NEGATIVE  NEGATIVE mg/dL Final   Hgb urine dipstick 05/15/2023 NEGATIVE  NEGATIVE Final   Bilirubin Urine 05/15/2023 NEGATIVE  NEGATIVE Final   Ketones, ur 05/15/2023 NEGATIVE  NEGATIVE mg/dL Final   Protein, ur 54/04/8118 NEGATIVE  NEGATIVE mg/dL Final   Nitrite 14/78/2956 NEGATIVE  NEGATIVE Final   Leukocytes,Ua 05/15/2023 LARGE (A)  NEGATIVE Final   RBC / HPF 05/15/2023 0-5  0 - 5 RBC/hpf Final   WBC, UA 05/15/2023 6-10  0 - 5 WBC/hpf Final   Bacteria, UA 05/15/2023 FEW (A)  NONE SEEN Final   Squamous Epithelial / HPF 05/15/2023 11-20  0 - 5 /HPF Final   Performed at [facility] Lab, [address] [place], [place] [address]   RPR Ser Ql 05/15/2023 NON REACTIVE  NON REACTIVE Final   Performed at [facility], [address], [place], [place] [address]   HIV Screen 4th Generation wRfx 05/15/2023 Non Reactive  Non Reactive Final   Performed at [facility], [address], [place], [place] [address]  Admission on 05/09/2023, Discharged on 05/09/2023  Component Date Value Ref Range Status   SARS Coronavirus 2 by RT PCR 05/09/2023 NEGATIVE  NEGATIVE Final   Influenza A by PCR 05/09/2023 NEGATIVE  NEGATIVE Final   Influenza B by PCR 05/09/2023 NEGATIVE  NEGATIVE Final   Comment: (NOTE) The Xpert Xpress SARS-CoV-2/FLU/RSV plus assay is intended as an aid in the diagnosis of influenza from Nasopharyngeal swab specimens  and should not be used as a sole basis for treatment. Nasal washings and aspirates are unacceptable for Xpert Xpress SARS-CoV-2/FLU/RSV testing.  Fact Sheet for Patients: BloggerCourse.com  Fact Sheet for Healthcare Providers: SeriousBroker.it  This test is not yet approved or cleared by the [place] FDA and has been authorized for detection and/or diagnosis of SARS-CoV-2 by FDA under an Emergency Use Authorization (EUA). This EUA will remain in effect (meaning this test can be used) for the duration of the COVID-19 declaration under Section 564(b)(1) of the Act, 21 U.S.C. section 360bbb-3(b)(1), unless the authorization is terminated or revoked.     Resp Syncytial Virus by PCR 05/09/2023 NEGATIVE  NEGATIVE Final   Comment: (NOTE) Fact Sheet for Patients: BloggerCourse.com  Fact Sheet for Healthcare Providers: SeriousBroker.it  This test is not yet approved or cleared by the [place] FDA and has been authorized for detection and/or diagnosis of SARS-CoV-2 by FDA under an Emergency Use Authorization (EUA). This EUA will remain in effect (meaning this test can be used) for the duration of the COVID-19 declaration under Section 564(b)(1) of the Act, 21 U.S.C. section 360bbb-3(b)(1), unless the authorization is terminated or revoked.  Performed at [facility], [address] [place], [place] [address]    Sodium 05/09/2023 139  135 - 145 mmol/L Final   Potassium 05/09/2023 4.0  3.5 - 5.1 mmol/L Final   Chloride 05/09/2023 103  98 - 111 mmol/L Final   CO2 05/09/2023 23  22 - 32 mmol/L Final   Glucose, Bld 05/09/2023 87  70 -  99 mg/dL Final   Glucose reference range applies only to samples taken after fasting for at least 8 hours.   BUN 05/09/2023 11  6 - 20 mg/dL Final   Creatinine, Ser 05/09/2023 0.83  0.44 - 1.00 mg/dL Final   Calcium 19/14/7829 9.3  8.9 -  10.3 mg/dL Final   GFR, Estimated 05/09/2023 >60  >60 mL/min Final   Comment: (NOTE) Calculated using the CKD-EPI Creatinine Equation (2021)    Anion gap 05/09/2023 13  5 - 15 Final   Performed at [facility] Lab, [address] [place], [place] [address]   WBC 05/09/2023 8.5  4.0 - 10.5 K/uL Final   RBC 05/09/2023 4.86  3.87 - 5.11 MIL/uL Final   Hemoglobin 05/09/2023 12.7  12.0 - 15.0 g/dL Final   HCT 08/65/7846 39.5  36.0 - 46.0 % Final   MCV 05/09/2023 81.3  80.0 - 100.0 fL Final   MCH 05/09/2023 26.1  26.0 - 34.0 pg Final   MCHC 05/09/2023 32.2  30.0 - 36.0 g/dL Final   RDW 96/29/5284 14.6  11.5 - 15.5 % Final   Platelets 05/09/2023 235  150 - 400 K/uL Final   nRBC 05/09/2023 0.0  0.0 - 0.2 % Final   Performed at [facility] Lab, [address] [place], [place] [address]   Troponin I (High Sensitivity) 05/09/2023 3  <18 ng/L Final   Comment: (NOTE) Elevated high sensitivity troponin I (hsTnI) values and significant  changes across serial measurements may suggest ACS but many other  chronic and acute conditions are known to elevate hsTnI results.  Refer to the "Links" section for chest pain algorithms and additional  guidance. Performed at [facility] Lab, [address] [place], [place] [address]    Preg, Serum 05/09/2023 NEGATIVE  NEGATIVE Final   Comment:        THE SENSITIVITY OF THIS METHODOLOGY IS >10 mIU/mL. Performed at [facility] Lab, [address] [place], [place] [address]   Admission on 04/19/2023, Discharged on 04/19/2023  Component Date Value Ref Range Status   Sodium 04/19/2023 137  135 - 145 mmol/L Final   Potassium 04/19/2023 3.6  3.5 - 5.1 mmol/L Final   Chloride 04/19/2023 102  98 - 111 mmol/L Final   CO2 04/19/2023 23  22 - 32 mmol/L Final   Glucose, Bld 04/19/2023 96  70 - 99 mg/dL Final   Glucose reference range applies only to samples taken after fasting for at least 8 hours.   BUN 04/19/2023 8  6 - 20 mg/dL Final   Creatinine,  Ser 04/19/2023 0.86  0.44 - 1.00 mg/dL Final   Calcium 44/10/4740 9.3  8.9 - 10.3 mg/dL Final   GFR, Estimated 04/19/2023 >60  >60 mL/min Final   Comment: (NOTE) Calculated using the CKD-EPI Creatinine Equation (2021)    Anion gap 04/19/2023 12  5 - 15 Final   Performed at [facility], [address], [place], [place] [address]   WBC 04/19/2023 9.1  4.0 - 10.5 K/uL Final   RBC 04/19/2023 4.64  3.87 - 5.11 MIL/uL Final   Hemoglobin 04/19/2023 12.3  12.0 - 15.0 g/dL Final   HCT 87/56/4332 36.9  36.0 - 46.0 % Final   MCV 04/19/2023 79.5 (L)  80.0 - 100.0 fL Final   MCH 04/19/2023 26.5  26.0 - 34.0 pg Final   MCHC 04/19/2023 33.3  30.0 - 36.0 g/dL Final   RDW 95/18/8416 15.3  11.5 -  15.5 % Final   Platelets 04/19/2023 251  150 - 400 K/uL Final   nRBC 04/19/2023 0.0  0.0 - 0.2 % Final   Performed at [facility] Lab, [address] [place], [place] [address]   Troponin I (High Sensitivity) 04/19/2023 3  <18 ng/L Final   Comment: (NOTE) Elevated high sensitivity troponin I (hsTnI) values and significant  changes across serial measurements may suggest ACS but many other  chronic and acute conditions are known to elevate hsTnI results.  Refer to the "Links" section for chest pain algorithms and additional  guidance. Performed at [facility] Lab, [address], [place], [place] [address]    Preg, Serum 04/19/2023 NEGATIVE  NEGATIVE Final   Comment:        THE SENSITIVITY OF THIS METHODOLOGY IS >10 mIU/mL. Performed at [facility] Lab, [address], [place], [place] [address]   Admission on 03/29/2023, Discharged on 04/03/2023  Component Date Value Ref Range Status   Hgb A1c MFr Bld 03/30/2023 5.2  4.8 - 5.6 % Final   Comment: (NOTE) Pre diabetes:          5.7%-6.4%  Diabetes:              >6.4%  Glycemic control for   <7.0% adults with diabetes    Mean Plasma Glucose 03/30/2023 102.54  mg/dL Final   Performed at [facility] Lab, [address]  [place], [place] [address]   Cholesterol 03/30/2023 148  0 - 200 mg/dL Final   Triglycerides 84/13/2440 69  <150 mg/dL Final   HDL 06/17/2535 32 (L)  >40 mg/dL Final   Total CHOL/HDL Ratio 03/30/2023 4.6  RATIO Final   VLDL 03/30/2023 14  0 - 40 mg/dL Final   LDL Cholesterol 03/30/2023 102 (H)  0 - 99 mg/dL Final   Comment:        Total Cholesterol/HDL:CHD Risk Coronary Heart Disease Risk Table                     Men   Women  1/2 Average Risk   3.4   3.3  Average Risk       5.0   4.4  2 X Average Risk   9.6   7.1  3 X Average Risk  23.4   11.0        Use the calculated Patient Ratio above and the CHD Risk Table to determine the patient's CHD Risk.        ATP III CLASSIFICATION (LDL):  <100     mg/dL   Optimal  644-034  mg/dL   Near or Above                    Optimal  130-159  mg/dL   Borderline  742-595  mg/dL   High  >638     mg/dL   Very High Performed at [facility], [address], [place], [place] [address]    Sodium 03/31/2023 137  135 - 145 mmol/L Final   Potassium 03/31/2023 3.8  3.5 - 5.1 mmol/L Final   Chloride 03/31/2023 106  98 - 111 mmol/L Final   CO2 03/31/2023 21 (L)  22 - 32 mmol/L Final   Glucose, Bld 03/31/2023 96  70 - 99 mg/dL Final   Glucose reference range applies only to samples taken after fasting for at least 8 hours.   BUN 03/31/2023 12  6 - 20 mg/dL  Final   Creatinine, Ser 03/31/2023 0.94  0.44 - 1.00 mg/dL Final   Calcium 29/52/8413 9.1  8.9 - 10.3 mg/dL Final   Total Protein 24/40/1027 7.3  6.5 - 8.1 g/dL Final   Albumin 25/36/6440 3.8  3.5 - 5.0 g/dL Final   AST 34/74/2595 16  15 - 41 U/L Final   ALT 03/31/2023 15  0 - 44 U/L Final   Alkaline Phosphatase 03/31/2023 91  38 - 126 U/L Final   Total Bilirubin 03/31/2023 0.4  0.3 - 1.2 mg/dL Final   GFR, Estimated 03/31/2023 >60  >60 mL/min Final   Comment: (NOTE) Calculated using the CKD-EPI Creatinine Equation (2021)    Anion gap 03/31/2023 10  5 - 15 Final   Performed at  [facility], [address], [place], [place] [address]   TSH 03/31/2023 0.623  0.350 - 4.500 uIU/mL Final   Comment: Performed by a 3rd Generation assay with a functional sensitivity of <=0.01 uIU/mL. Performed at [facility], [address], [place], [place] [address]   Admission on 03/29/2023, Discharged on 03/29/2023  Component Date Value Ref Range Status   Sodium 03/29/2023 140  135 - 145 mmol/L Final   Potassium 03/29/2023 3.3 (L)  3.5 - 5.1 mmol/L Final   Chloride 03/29/2023 105  98 - 111 mmol/L Final   CO2 03/29/2023 23  22 - 32 mmol/L Final   Glucose, Bld 03/29/2023 85  70 - 99 mg/dL Final   Glucose reference range applies only to samples taken after fasting for at least 8 hours.   BUN 03/29/2023 <5 (L)  6 - 20 mg/dL Final   Creatinine, Ser 03/29/2023 0.93  0.44 - 1.00 mg/dL Final   Calcium 95/18/8416 9.5  8.9 - 10.3 mg/dL Final   Total Protein 60/63/0160 7.3  6.5 - 8.1 g/dL Final   Albumin 10/93/2355 3.8  3.5 - 5.0 g/dL Final   AST 73/22/0254 18  15 - 41 U/L Final   ALT 03/29/2023 16  0 - 44 U/L Final   Alkaline Phosphatase 03/29/2023 95  38 - 126 U/L Final   Total Bilirubin 03/29/2023 0.2 (L)  0.3 - 1.2 mg/dL Final   GFR, Estimated 03/29/2023 >60  >60 mL/min Final   Comment: (NOTE) Calculated using the CKD-EPI Creatinine Equation (2021)    Anion gap 03/29/2023 12  5 - 15 Final   Performed at [facility], [address] [place], [place] [address]   Alcohol, Ethyl (B) 03/29/2023 <10  <10 mg/dL Final   Comment: (NOTE) Lowest detectable limit for serum alcohol is 10 mg/dL.  For medical purposes only. Performed at [facility], [address], [place], [place] [address]    Salicylate Lvl 03/29/2023 <7.0 (L)  7.0 - 30.0 mg/dL Final   Performed at [facility], [address], [place], [place] [address]   Acetaminophen (Tylenol), Serum 03/29/2023 <10 (L)  10 - 30 ug/mL Final   Comment: (NOTE) Therapeutic  concentrations vary significantly. A range of 10-30 ug/mL  may be an effective concentration for many patients. However, some  are best treated at concentrations outside of this range. Acetaminophen concentrations >150 ug/mL at 4 hours after ingestion  and >50 ug/mL at 12 hours after ingestion are often associated with  toxic reactions.  Performed at [facility], [address] [place], [place] [address]    Opiates 03/29/2023 NONE DETECTED  NONE DETECTED Final   Cocaine 03/29/2023 NONE DETECTED  NONE DETECTED Final   Benzodiazepines 03/29/2023 NONE DETECTED  NONE DETECTED Final   Amphetamines 03/29/2023 NONE DETECTED  NONE DETECTED Final   Tetrahydrocannabinol 03/29/2023 NONE DETECTED  NONE DETECTED Final   Barbiturates 03/29/2023 NONE DETECTED  NONE DETECTED Final   Comment: (NOTE) DRUG SCREEN FOR MEDICAL PURPOSES ONLY.  IF CONFIRMATION IS NEEDED FOR ANY PURPOSE, NOTIFY LAB WITHIN 5 DAYS.  LOWEST DETECTABLE LIMITS FOR URINE DRUG SCREEN Drug Class                     Cutoff (ng/mL) Amphetamine and metabolites    1000 Barbiturate and metabolites    200 Benzodiazepine                 200 Opiates and metabolites        300 Cocaine and metabolites        300 THC                            50 Performed at [facility], [address], [place], [place] [address]    Preg, Serum 03/29/2023 NEGATIVE  NEGATIVE Final   Comment:        THE SENSITIVITY OF THIS METHODOLOGY IS >10 mIU/mL. Performed at [facility], [address] [place], [place] [address]    WBC 03/29/2023 8.9  4.0 - 10.5 K/uL Final   RBC 03/29/2023 4.94  3.87 - 5.11 MIL/uL Final   Hemoglobin 03/29/2023 13.0  12.0 - 15.0 g/dL Final   HCT 62/13/0865 39.1  36.0 - 46.0 % Final   MCV 03/29/2023 79.1 (L)  80.0 - 100.0 fL Final   MCH 03/29/2023 26.3  26.0 - 34.0 pg Final   MCHC 03/29/2023 33.2  30.0 - 36.0 g/dL Final   RDW 78/46/9629 15.3  11.5 - 15.5 % Final   Platelets 03/29/2023 266  150 - 400 K/uL  Final   nRBC 03/29/2023 0.0  0.0 - 0.2 % Final   Performed at [facility], [address] [place], [place] [address]   Yeast Wet Prep HPF POC 03/29/2023 NONE SEEN  NONE SEEN Final   Trich, Wet Prep 03/29/2023 NONE SEEN  NONE SEEN Final   Clue Cells Wet Prep HPF POC 03/29/2023 PRESENT (A)  NONE SEEN Final   WBC, Wet Prep HPF POC 03/29/2023 <10  <10 Final   Sperm 03/29/2023 NONE SEEN   Final   Performed at [facility], [address], [place], [place] [address]   Chlamydia 03/29/2023 Positive (A)   Final   Neisseria Gonorrhea 03/29/2023 Negative   Final   Comment 03/29/2023 Normal Reference Range Neisseria Gonorrhea - Negative   Final   Comment 03/29/2023 Normal Reference Ranger Chlamydia - Negative   Final  Admission on 02/18/2023, Discharged on 02/18/2023  Component Date Value Ref Range Status   Group A Strep by PCR 02/18/2023 NOT DETECTED  NOT DETECTED Final   Performed at [facility], [address], [place], [place] [address]   SARS Coronavirus 2 by RT PCR 02/18/2023 NEGATIVE  NEGATIVE Final   Influenza A by PCR 02/18/2023 NEGATIVE  NEGATIVE Final   Influenza B by PCR 02/18/2023 NEGATIVE  NEGATIVE Final   Comment: (NOTE) The Xpert Xpress SARS-CoV-2/FLU/RSV plus assay is intended as an aid in the diagnosis of influenza from Nasopharyngeal swab specimens and should not be used as a sole basis for treatment. Nasal washings  and aspirates are unacceptable for Xpert Xpress SARS-CoV-2/FLU/RSV testing.  Fact Sheet for Patients: BloggerCourse.com  Fact Sheet for Healthcare Providers: SeriousBroker.it  This test is not yet approved or cleared by the [place] FDA and has been authorized for detection and/or diagnosis of SARS-CoV-2 by FDA under an Emergency Use Authorization (EUA). This EUA will remain in effect (meaning this test can be used) for the duration of the COVID-19 declaration under Section 564(b)(1) of  the Act, 21 U.S.C. section 360bbb-3(b)(1), unless the authorization is terminated or revoked.     Resp Syncytial Virus by PCR 02/18/2023 NEGATIVE  NEGATIVE Final   Comment: (NOTE) Fact Sheet for Patients: BloggerCourse.com  Fact Sheet for Healthcare Providers: SeriousBroker.it  This test is not yet approved or cleared by the [place] FDA and has been authorized for detection and/or diagnosis of SARS-CoV-2 by FDA under an Emergency Use Authorization (EUA). This EUA will remain in effect (meaning this test can be used) for the duration of the COVID-19 declaration under Section 564(b)(1) of the Act, 21 U.S.C. section 360bbb-3(b)(1), unless the authorization is terminated or revoked.  Performed at [facility], [address], [place], [place] [address]   Admission on 02/17/2023, Discharged on 02/17/2023  Component Date Value Ref Range Status   Lipase 02/17/2023 30  11 - 51 U/L Final   Performed at [facility], [address] [place], [place] [address]   Sodium 02/17/2023 137  135 - 145 mmol/L Final   Potassium 02/17/2023 3.4 (L)  3.5 - 5.1 mmol/L Final   Chloride 02/17/2023 104  98 - 111 mmol/L Final   CO2 02/17/2023 23  22 - 32 mmol/L Final   Glucose, Bld 02/17/2023 90  70 - 99 mg/dL Final   Glucose reference range applies only to samples taken after fasting for at least 8 hours.   BUN 02/17/2023 <5 (L)  6 - 20 mg/dL Final   Creatinine, Ser 02/17/2023 0.86  0.44 - 1.00 mg/dL Final   Calcium 72/53/6644 9.4  8.9 - 10.3 mg/dL Final   Total Protein 03/47/4259 7.1  6.5 - 8.1 g/dL Final   Albumin 56/38/7564 3.7  3.5 - 5.0 g/dL Final   AST 33/29/5188 19  15 - 41 U/L Final   ALT 02/17/2023 17  0 - 44 U/L Final   Alkaline Phosphatase 02/17/2023 101  38 - 126 U/L Final   Total Bilirubin 02/17/2023 0.6  0.3 - 1.2 mg/dL Final   GFR, Estimated 02/17/2023 >60  >60 mL/min Final   Comment: (NOTE) Calculated using the  CKD-EPI Creatinine Equation (2021)    Anion gap 02/17/2023 10  5 - 15 Final   Performed at [facility], [address] [place], [place] [address]   WBC 02/17/2023 6.6  4.0 - 10.5 K/uL Final   RBC 02/17/2023 5.16 (H)  3.87 - 5.11 MIL/uL Final   Hemoglobin 02/17/2023 13.6  12.0 - 15.0 g/dL Final   HCT 63/08/6008 41.1  36.0 - 46.0 % Final   MCV 02/17/2023 79.7 (L)  80.0 - 100.0 fL Final   MCH 02/17/2023 26.4  26.0 - 34.0 pg Final   MCHC 02/17/2023 33.1  30.0 - 36.0 g/dL Final   RDW 93/23/5573 15.8 (H)  11.5 - 15.5 % Final   Platelets 02/17/2023 268  150 - 400 K/uL Final   nRBC 02/17/2023 0.0  0.0 - 0.2 % Final   Performed at [facility], [address], [place], [place] [address]  Color, Urine 02/17/2023 YELLOW  YELLOW Final   APPearance 02/17/2023 HAZY (A)  CLEAR Final   Specific Gravity, Urine 02/17/2023 1.004 (L)  1.005 - 1.030 Final   pH 02/17/2023 6.0  5.0 - 8.0 Final   Glucose, UA 02/17/2023 NEGATIVE  NEGATIVE mg/dL Final   Hgb urine dipstick 02/17/2023 NEGATIVE  NEGATIVE Final   Bilirubin Urine 02/17/2023 NEGATIVE  NEGATIVE Final   Ketones, ur 02/17/2023 NEGATIVE  NEGATIVE mg/dL Final   Protein, ur 91/47/8295 NEGATIVE  NEGATIVE mg/dL Final   Nitrite 62/13/0865 NEGATIVE  NEGATIVE Final   Leukocytes,Ua 02/17/2023 LARGE (A)  NEGATIVE Final   RBC / HPF 02/17/2023 0-5  0 - 5 RBC/hpf Final   WBC, UA 02/17/2023 21-50  0 - 5 WBC/hpf Final   Bacteria, UA 02/17/2023 RARE (A)  NONE SEEN Final   Squamous Epithelial / HPF 02/17/2023 11-20  0 - 5 /HPF Final   Mucus 02/17/2023 PRESENT   Final   Performed at [facility], [address], [place], [place] [address]   Preg, Serum 02/17/2023 NEGATIVE  NEGATIVE Final   Performed at [facility], [address], [place], [place] [address]   Neisseria Gonorrhea 02/17/2023 Negative   Final   Chlamydia 02/17/2023 Positive (A)   Final   Comment 02/17/2023 Normal Reference Ranger Chlamydia - Negative   Final   Comment  02/17/2023 Normal Reference Range Neisseria Gonorrhea - Negative   Final   Yeast Wet Prep HPF POC 02/17/2023 NONE SEEN  NONE SEEN Final   Trich, Wet Prep 02/17/2023 PRESENT (A)  NONE SEEN Final   Clue Cells Wet Prep HPF POC 02/17/2023 NONE SEEN  NONE SEEN Final   WBC, Wet Prep HPF POC 02/17/2023 >=10 (A)  <10 Final   Sperm 02/17/2023 NONE SEEN   Final   Performed at [facility], [address] [place], [place] [address]   Specimen Description 02/17/2023 URINE, CLEAN CATCH   Final   Special Requests 02/17/2023    Final                   Value:NONE Performed at [facility], [address], [place], [place] [address]    Culture 02/17/2023 30,000 COLONIES/mL MULTIPLE SPECIES PRESENT, SUGGEST RECOLLECTION (A)   Final   Report Status 02/17/2023 02/18/2023 FINAL   Final    Allergies: Benadryl [diphenhydramine hcl], Haldol [haloperidol], Latex, Shellfish allergy, Zithromax [azithromycin], Camphor, Gluten meal, and Lactose intolerance (gi)  Medications:  PTA Medications  Medication Sig   ferrous sulfate 325 (65 FE) MG tablet Take 1 tablet (325 mg total) by mouth at bedtime. (Patient taking differently: Take 325 mg by mouth every other day.)   valACYclovir (VALTREX) 1000 MG tablet Take 1 tablet (1,000 mg total) by mouth 2 (two) times daily.   medroxyPROGESTERone Acetate 150 MG/ML SUSY Inject 150 mg into the muscle every 3 (three) months.   albuterol (VENTOLIN HFA) 108 (90 Base) MCG/ACT inhaler Inhale 1 puff into the lungs 4 (four) times daily as needed for wheezing or shortness of breath.   hydrOXYzine (ATARAX) 50 MG tablet Take 1 tablet (50 mg total) by mouth 3 (three) times daily as needed for anxiety.   metroNIDAZOLE (FLAGYL) 500 MG tablet Take 1 tablet (500 mg total) by mouth every 12 (twelve) hours.   doxycycline (VIBRA-TABS) 100 MG tablet Take 1 tablet (100 mg total) by mouth every 12 (twelve) hours.   busPIRone (BUSPAR) 10 MG tablet Take 1 tablet (10 mg  total) by mouth 3  (three) times daily.   nicotine (NICODERM CQ - DOSED IN MG/24 HOURS) 14 mg/24hr patch Place 1 patch (14 mg total) onto the skin daily at 6 (six) AM.   benzonatate (TESSALON) 100 MG capsule Take 1 capsule (100 mg total) by mouth every 8 (eight) hours.      Medical Decision Making  Recommend admission to continuous observation unit for safety monitoring overnight and re-eval in the am for final disposition.  Patient is drowsy with slowed but coherent responses. She endorses drinking a big single bottle of wine cooler at 12 midnight and smoking some cigarettes. Denies other illicit substance use.and reports being "up/awake gambling for long periods".    Lab Orders         CBC with Differential/Platelet         Comprehensive metabolic panel         Hemoglobin A1c         Ethanol         POC urine preg, ED         POCT Urine Drug Screen - (I-Screen)      Home medications restarted -Buspar 5 mg PO BID for depressive and anxiety symptoms -Risperdal 1 mg PO, BID for mood stabilization/BPD -Atarax 25 mg  PO, TID, PRN, anxiety  Nicoderm CQ 21 mg transdermal patch Q 24 hrs for nicotine dependence  Other prns -Tylenol 650 mg PO q6h, prn pain -Maalox 15 ml. PO q6h, prn , indigestion -MOM 15 ml, PO daily prn, constipation  Recommendations  Based on my evaluation the patient does not appear to have an emergency medical condition.  Recommend admission to continuous observation unit for safety monitoring overnight and re-eval in the am for final disposition.   Mancel Bale, NP 07/13/23  4:25 AM

## 2023-07-13 NOTE — Progress Notes (Signed)
   07/13/23 0343  [facility] Triage Screening (Walk-ins at [facility] only)  How Did You Hear About [place]? Self  What Is the Reason for Your Visit/Call Today? Pt presents to [facility] as a voluntary walk-in,unaccompanied due to suicidal ideation with a plan to cut herself. Pt reports that she needs to get back on her medication (Risperdal, Hydroxyzine, and Buspar). Pt reports that she has not had medications in about a month or so. Pt stated "I just feel like I'm about to have a psychotic episode". Pt has history of MDD, GAD. Pt currently denies HI,AVH and substance/alcohol use.  How Long Has This Been Causing You Problems? 1 wk - 1 month  Have You Recently Had Any Thoughts About Hurting Yourself? Yes  How long ago did you have thoughts about hurting yourself? currently  Are You Planning to Commit Suicide/Harm Yourself At This time? No  Have you Recently Had Thoughts About Hurting Someone Karolee Ohs? No  Are You Planning To Harm Someone At This Time? No  Are you currently experiencing any auditory, visual or other hallucinations? No  Have You Used Any Alcohol or Drugs in the Past 24 Hours? No  Do you have any current medical co-morbidities that require immediate attention? No  Clinician description of patient physical appearance/behavior: calm, cooperative  What Do You Feel Would Help You the Most Today? Treatment for Depression or other mood problem;Medication(s)  If access to [facility] was not available, would you have sought care in the Emergency Department? No  Determination of Need Urgent (48 hours)  Options For Referral Other: Comment;Outpatient Therapy;Medication Management;BH Urgent Care

## 2023-07-13 NOTE — Discharge Instructions (Addendum)
Discharge recommendations:   Medications: Patient is to take medications as prescribed. The patient or patient's guardian is to contact a medical professional and/or outpatient provider to address any new side effects that develop. The patient or the patient's guardian should update outpatient providers of any new medications and/or medication changes.    Outpatient Follow up: Please review list of outpatient resources for psychiatry and counseling. Please follow up with your primary care provider for all medical related needs.   You are encouraged to follow up with [facility] for outpatient treatment.  Walk in/ Open Access Hours: Monday - Friday 8AM - 11AM (To see provider and therapist) Friday - 1PM - 4PM (To see therapist only)  [facility] [address] [place], [place] [phone]  Therapy: We recommend that patient participate in individual therapy to address mental health concerns.   Atypical antipsychotics: If you are prescribed an atypical antipsychotic, it is recommended that your height, weight, BMI, blood pressure, fasting lipid panel, and fasting blood sugar be monitored by your outpatient providers.  Safety:   The following safety precautions should be taken:   No sharp objects. This includes scissors, razors, scrapers, and putty knives.   Chemicals should be removed and locked up.   Medications should be removed and locked up.   Weapons should be removed and locked up. This includes firearms, knives and instruments that can be used to cause injury.   The patient should abstain from use of illicit substances/drugs and abuse of any medications.  If symptoms worsen or do not continue to improve or if the patient becomes actively suicidal or homicidal then it is recommended that the patient return to the closest hospital emergency department, the [facility], or call 911 for further evaluation and  treatment. National Suicide Prevention Lifeline 1-800-SUICIDE or (971) 081-6288.  About 988 988 offers 24/7 access to trained crisis counselors who can help people experiencing mental health-related distress. People can call or text 988 or chat 988lifeline.org for themselves or if they are worried about a loved one who may need crisis support.     Outpatient Services for Therapy and Medication Management   Based on what you have shared, a list of resources for outpatient therapy and psychiatry is provided below to get you started back on treatment.  It is imperative that you follow through with treatment within 5-7 days from the day of discharge to prevent any further risk to your safety or mental well-being.  You are not limited to the list provided.  In case of an urgent crisis, you may contact the Mobile Crisis Unit with [facility] at [phone].         [facility] [address], [place], [address] [phone] phone  New Patient Assessment/Therapy Walk-ins Monday and Wednesday: 8am until slots are full. Every 1st and 2nd Friday: 1pm - 5pm  NO ASSESSMENT/THERAPY WALK-INS ON TUESDAYS OR THURSDAYS  New Patient Psychiatry/Medication Management Walk-ins Monday-Friday: 8am-11am  For all walk-ins, we ask that you arrive by 7:30am because patient will be seen in the order of arrival.  Availability is limited; therefore, you may not be seen on the same day that you walk-in.  Our goal is to serve and meet the needs of our community to the best of our ability.   [facility] [address] [place], [place], [address] [phone] phone  [facility] [address] [place],  [place], [address] [phone] phone [phone] phone ([address], [address], Anthem/Elevance, [address], [address], [address], [employer], Healthy [place], [place], [place], [address], [employer], [place], [facility], [employer], [place], [employer] of Network)  [employer], [place] [address] [place], [place], [address] [phone] phone ([address], Anthem/Elevance, San[employer]SCI Options/Carelon, BCBS, [address], [place], [place], [place], [place], [employer], [place], [place], [place], [facility])  [employer] [address] [place], [place], [address] [phone] phone (Medicaid, ask about other insurance)  [facility] [address], [address] [place], [place], [address] [phone] phone [phone] fax ([address] [place] , [place], [place], [place] Health Choice, UHC, [employer] Electric, Self-Pay)  [facility] [address] [place], [place], [address] [phone] phone ([address] Anthem/Elevance, [address], [address], [place], [employer], [place], [place], [place], [employer], [place], [place], [place], [facility])  [facility] - 6-8 MONTH WAIT FOR THERAPY; [facility] FOR MEDICATION MANAGEMENT [address] [place], [place], [address] [phone] phone ([address] [employer]Health [address] - [place], [address], [place], [place], Friday Health Plans, [address], BCBS Healthy [place], [place], [address], [place], [place], [place], [place], Tricare, UHC, [employer]co [employer], [place])  [facility] [address] [place], [place], [address] [phone] phone  [facility] [address], [address] [place], [place], [address] [phone] phone  [facility] [address], [address] [place], [place], [address] [phone] phone  [facility] [place] - THERAPY ONLY [address], [place], [address] [phone] phone  [facility], [place] [address], [place], [address] [phone] phone  [facility] [address] [place], [place], [address] [phone] phone [phone] fax  [facility] [address] [place], [place], [address] [phone] phone [phone] fax  [facility] [address] [place], [place], [address] [phone] phone  Jovita Kussmaul [address] [place], [place], [address]  [phone] phone  [facility]  (Adults Only) [address] [place], [place], [address]  [phone] phone [phone] fax

## 2023-07-13 NOTE — Group Note (Deleted)
Group Topic: Decisional Balance/Substance Abuse  Group Date: 07/13/2023 Start Time: 1000 End Time: 1100 Facilitators: Donnie Coffin, NT  Department: [facility]  Number of Participants: 2  Group Focus: acceptance, chemical dependency education, chemical dependency issues, and communication Treatment Modality:  Skills Training Interventions utilized were group exercise Purpose: improve communication skills   Name: Melissa [name] Date of Birth: 11-Apr-2001  MR: [id number]    Level of Participation: {THERAPIES; PSYCH GROUP PARTICIPATION RKYHC:62376} Quality of Participation: {THERAPIES; PSYCH QUALITY OF PARTICIPATION:23992} Interactions with others: {THERAPIES; PSYCH INTERACTIONS:23993} Mood/Affect: {THERAPIES; PSYCH MOOD/AFFECT:23994} Triggers (if applicable): *** Cognition: {THERAPIES; PSYCH COGNITION:23995} Progress: {THERAPIES; PSYCH PROGRESS:23997} Response: *** Plan: {THERAPIES; PSYCH EGBT:51761}  Patients Problems:  Patient Active Problem List   Diagnosis Date Noted   MDD (major depressive disorder), recurrent severe, 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   Homelessness 03/29/2023   OCD (obsessive compulsive disorder) 11/23/2022   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   Delivery by emergency cesarean 06/05/2022   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   Vaginal bleeding during pregnancy 05/28/2022   Gonorrhea affecting pregnancy in second trimester 05/18/2022   ?fetal pericardial effusion 05/18/2022   [redacted] weeks gestation of pregnancy    Preterm premature rupture of membranes (PPROM) with unknown onset of labor 05/17/2022   Oligohydramnios antepartum 05/11/2022   Abnormal antenatal AFP screen 04/12/2022   History of cesarean section 04/07/2022   Bartholin cyst 04/07/2022   History of herpes genitalis 04/07/2022   Supervision of high risk pregnancy, antepartum 02/08/2022   Trichomonal vaginitis  during pregnancy in second trimester 02/01/2022   History of postpartum depression 11/16/2021   Abnormal involuntary movements 08/04/2020   Migraine with aura and without status migrainosus, not intractable 08/12/2019   Seizures ([name])    Sickle cell trait ([name]) 02/13/2019   Rh negative state in antepartum period 02/05/2019   Short interval between pregnancies affecting pregnancy in second trimester, antepartum 02/05/2019   

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
## 2023-07-15 ENCOUNTER — Ambulatory Visit (HOSPITAL_COMMUNITY)
Admission: EM | Admit: 2023-07-15 | Discharge: 2023-07-15 | Discharge status: Against Medical Advice | Payer: 59 | Attending: Family Medicine | Admitting: Family Medicine

## 2023-07-15 ENCOUNTER — Encounter (HOSPITAL_COMMUNITY): Payer: Self-pay

## 2023-07-15 DIAGNOSIS — K146 Glossodynia: Secondary | ICD-10-CM

## 2023-07-15 DIAGNOSIS — Z113 Encounter for screening for infections with a predominantly sexual mode of transmission: Secondary | ICD-10-CM | POA: Diagnosis not present

## 2023-07-15 DIAGNOSIS — R109 Unspecified abdominal pain: Secondary | ICD-10-CM | POA: Diagnosis present

## 2023-07-15 DIAGNOSIS — A749 Chlamydial infection, unspecified: Secondary | ICD-10-CM

## 2023-07-15 LAB — POCT URINALYSIS DIP (MANUAL ENTRY)
Bilirubin, UA: NEGATIVE
Blood, UA: NEGATIVE
Glucose, UA: NEGATIVE mg/dL
Ketones, POC UA: NEGATIVE mg/dL
Nitrite, UA: NEGATIVE
Protein Ur, POC: NEGATIVE mg/dL
Spec Grav, UA: 1.02 (ref 1.010–1.025)
Urobilinogen, UA: 0.2 U/dL
pH, UA: 5.5 (ref 5.0–8.0)

## 2023-07-15 LAB — POCT URINE PREGNANCY: Preg Test, Ur: NEGATIVE

## 2023-07-15 LAB — HIV ANTIBODY (ROUTINE TESTING W REFLEX): HIV Screen 4th Generation wRfx: NONREACTIVE

## 2023-07-15 MED ORDER — CEFTRIAXONE SODIUM 500 MG IJ SOLR: 500.0000 mg | Freq: Once | INTRAMUSCULAR | Status: DC

## 2023-07-15 MED ORDER — DOXYCYCLINE HYCLATE 100 MG PO CAPS: 100.0000 mg | ORAL_CAPSULE | Freq: Two times a day (BID) | ORAL | 0 refills | Status: AC

## 2023-07-15 MED ORDER — LIDOCAINE HCL (PF) 1 % IJ SOLN
INTRAMUSCULAR | Status: AC
Start: 2023-07-15 — End: ?
  Filled 2023-07-15: qty 2

## 2023-07-15 MED ORDER — CEFTRIAXONE SODIUM 500 MG IJ SOLR
INTRAMUSCULAR | Status: AC
  Filled 2023-07-15: qty 500

## 2023-07-15 NOTE — ED Triage Notes (Signed)
Patient here today with c/o lower abd pain X 4 days. Patient states that she tested positive for chlamydia last month but was never treated for it. Patient gets Depo injections but her last one was in July.   Patient also c/o redness under her tongue X 4 days.

## 2023-07-15 NOTE — Discharge Instructions (Signed)
  Nice seeing you today. I am sorry about your oral and abdominal symptoms. We collected samples to be checked for STDs. Given your untreated Chlamydia, we went ahead and treated you empirically with antibiotics. We gave you a shot here and sent Doxycycline to your pharmacy. We will contact you with the test results soon.

## 2023-07-15 NOTE — ED Notes (Signed)
Rocephin was ordered for the patient but she left before the injection was given.

## 2023-07-15 NOTE — ED Provider Notes (Addendum)
[facility]    CSN: [id number] Arrival date & time: 07/15/23  1019      History   Chief Complaint Chief Complaint  Patient presents with   Abdominal Pain    HPI Melissa [name] is a 22 y.o. female.   The history is provided by the patient. No language interpreter was used.  Vaginal Discharge Quality:  Manson Passey Onset quality:  Gradual Duration:  3 weeks Timing:  Constant Progression:  Worsening Context comment:  LMP: April 2024. She was on Depo. Missed Depo shot but has an appointment Dec 3rd. Sexually active and has a new partner. Relieved by:  Nothing Worsened by:  Nothing Associated symptoms: abdominal pain, dysuria, fever and vaginal itching   Associated symptoms: no nausea and no vomiting   Abdominal pain:    Location:  Suprapubic   Quality: aching     Severity:  Moderate   Onset quality:  Gradual   Duration:  4 days   Timing:  Constant   Progression:  Worsening   Chronicity:  New Denies diarrhea or constipation  Tongue pain: Started about the same time as her suprab=pubic abdominal pain. She had oral sex 4 days earlier, and then she developed tongue pain.    Past Medical History:  Diagnosis Date   Anemia    Anxiety    Asthma    inhaler. 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   Seizures ([name])    last one in 2015 - on meds   Sickle cell trait ([name])    TBI (traumatic brain injury) ([name]) 2006    Patient Active Problem  List   Diagnosis Date Noted   MDD (major depressive disorder), recurrent severe, 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   Homelessness 03/29/2023   OCD (obsessive compulsive disorder) 11/23/2022   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   Delivery by emergency cesarean 06/05/2022   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   Vaginal bleeding during pregnancy 05/28/2022   Gonorrhea affecting pregnancy in second trimester 05/18/2022   ?fetal pericardial effusion 05/18/2022   [redacted] weeks gestation of pregnancy    Preterm premature rupture of membranes (PPROM) with unknown onset of labor 05/17/2022   Oligohydramnios antepartum 05/11/2022   Abnormal antenatal AFP screen 04/12/2022   History of cesarean section 04/07/2022   Bartholin cyst 04/07/2022   History of herpes genitalis 04/07/2022   Supervision of high risk pregnancy, antepartum 02/08/2022   Trichomonal vaginitis during pregnancy in second trimester 02/01/2022   History of postpartum depression 11/16/2021   Abnormal involuntary movements 08/04/2020   Migraine with aura and without status migrainosus, not intractable 08/12/2019   Seizures ([name])    Sickle cell trait ([name]) 02/13/2019   Rh negative state in antepartum period 02/05/2019   Short interval between pregnancies affecting pregnancy in second trimester, antepartum 02/05/2019   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   DMDD (disruptive mood dysregulation disorder) ([name]) 07/21/2016   Bipolar 1 disorder, mixed ([name])  07/17/2016   Chronic constipation 08/03/2015   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    Past Surgical History:  Procedure Laterality Date   APPENDECTOMY     CESAREAN SECTION N/A 08/31/2021   Procedure: CESAREAN SECTION;  Surgeon: Kathrynn Running, MD;  Location: [facility];  Service: Obstetrics;  Laterality:  N/A;   CESAREAN SECTION N/A 06/05/2022   Procedure: CESAREAN SECTION;  Surgeon: Hermina Staggers, MD;  Location: [facility];  Service: Obstetrics;  Laterality: N/A;   HERNIA REPAIR     INTESTINAL MALROTATION REPAIR  2001    OB History     Gravida  4   Para  3   Term  2   Preterm  1   AB  1   Living  3      SAB  1   IAB      Ectopic      Multiple  0   Live Births  3            Home Medications    Prior to Admission medications   Medication Sig Start Date End Date Taking? Authorizing Provider  doxycycline (VIBRAMYCIN) 100 MG capsule Take 1 capsule (100 mg total) by mouth 2 (two) times daily for 7 days. 07/15/23 07/22/23 Yes Doreene Eland, MD  albuterol (VENTOLIN HFA) 108 (90 Base) MCG/ACT inhaler Inhale 1 puff into the lungs 4 (four) times daily as needed for wheezing or shortness of breath.    [provider]  busPIRone (BUSPAR) 5 MG tablet Take 1 tablet (5 mg total) by mouth 2 (two) times daily. 07/13/23   McLauchlin, Marylene Land, NP  ferrous sulfate 325 (65 FE) MG tablet Take 1 tablet (325 mg total) by mouth at bedtime. Patient taking differently: Take 325 mg by mouth every other day. 04/14/22   Marylene Land, CNM  hydrOXYzine (ATARAX) 50 MG tablet Take 1 tablet (50 mg total) by mouth 3 (three) times daily as needed for anxiety. 03/14/23   Nwoko, Tommas Olp, PA  nicotine (NICODERM CQ - DOSED IN MG/24 HOURS) 21 mg/24hr patch Place 1 patch (21 mg total) onto the skin daily at 6 (six) AM. 07/14/23   McLauchlin, Marylene Land, NP  risperiDONE (RISPERDAL) 1 MG tablet Take 1 tablet (1 mg total) by mouth 2 (two) times daily. 07/13/23   McLauchlin, Marylene Land, NP  rizatriptan (MAXALT-MLT) 10 MG disintegrating tablet Take 1 tablet at onset of migraine with 2 ibuprofen may repeat an additional tablet in 2 hours if needed 05/07/20 06/09/20  Deetta Perla, MD    Family History Family History  Problem Relation Age of Onset   Depression Mother    Stroke Mother     Obesity Mother    Post-traumatic stress disorder Mother    Anxiety disorder Mother    Hypertension Mother    Diabetes Mother    Schizophrenia Father    Kidney disease Father     Social History Social History   Tobacco Use   Smoking status: Every Day    Current packs/day: 0.25    Types: Cigarettes   Smokeless tobacco: Never   Tobacco comments:    black and milds, 2 cigs daily  Vaping Use   Vaping status: Former  Substance Use Topics   Alcohol use: Not Currently    Comment: occasionally, prior to pregnancy   Drug use: Not Currently    Types: Marijuana  Comment: last 2020     Allergies   Benadryl [diphenhydramine hcl], Haldol [haloperidol], Latex, Shellfish allergy, Zithromax [azithromycin], Camphor, Gluten meal, and Lactose intolerance (gi)   Review of Systems Review of Systems  Constitutional:  Positive for fever.  Gastrointestinal:  Positive for abdominal pain. Negative for nausea and vomiting.  Genitourinary:  Positive for dysuria and vaginal discharge.     Physical Exam Triage Vital Signs ED Triage Vitals  Encounter Vitals Group     BP      Systolic BP Percentile      Diastolic BP Percentile      Pulse      Resp      Temp      Temp src      SpO2      Weight      Height      Head Circumference      Peak Flow      Pain Score      Pain Loc      Pain Education      Exclude from Growth Chart    No data found.  Updated Vital Signs BP 109/80 (BP Location: Right Arm)   Pulse 94   Temp 98.6 F (37 C) (Oral)   Resp 16   Ht 5\' 7"  (1.702 m)   Wt 111.1 kg   LMP 03/05/2023 (Approximate) Comment: Last injection was in July 2024  SpO2 98%   Breastfeeding No   BMI 38.37 kg/m   Visual Acuity Right Eye Distance:   Left Eye Distance:   Bilateral Distance:    Right Eye Near:   Left Eye Near:    Bilateral Near:     Physical Exam Vitals and nursing note reviewed.  Constitutional:      General: She is not in acute distress.    Appearance:  Normal appearance.  HENT:     Nose:     Comments: No lesion noted on her tongue or underneath her tongue Cardiovascular:     Rate and Rhythm: Normal rate and regular rhythm.     Heart sounds: Normal heart sounds. No murmur heard. Pulmonary:     Effort: Pulmonary effort is normal. No respiratory distress.     Breath sounds: Normal breath sounds. No wheezing or rhonchi.  Abdominal:     General: Abdomen is flat. Bowel sounds are normal. There is no distension.     Palpations: Abdomen is soft. There is no mass.     Tenderness: There is no abdominal tenderness. There is no guarding or rebound.     Hernia: No hernia is present.  Genitourinary:    Comments: She declined exam     UC Treatments / Results  Labs (all labs ordered are listed, but only abnormal results are displayed) Labs Reviewed  POCT URINALYSIS DIP (MANUAL ENTRY) - Abnormal; Notable for the following components:      Result Value   Clarity, UA cloudy (*)    Leukocytes, UA Moderate (2+) (*)    All other components within normal limits  HIV ANTIBODY (ROUTINE TESTING W REFLEX)  RPR  POCT URINE PREGNANCY  CERVICOVAGINAL ANCILLARY ONLY  CYTOLOGY, (ORAL, ANAL, URETHRAL) ANCILLARY ONLY    EKG   Radiology No results found.  Procedures Procedures (including critical care time)  Medications Ordered in UC Medications  cefTRIAXone (ROCEPHIN) injection 500 mg (has no administration in time range)    Initial Impression / Assessment and Plan / UC Course  I have reviewed the triage vital signs  and the nursing notes.  Pertinent labs & imaging results that were available during my care of the patient were reviewed by me and considered in my medical decision making (see chart for details).  Clinical Course as of 07/15/23 1314  Sun Jul 15, 2023  1312 Abdominal pain/STD: Recent untreated Chlamydia per the patient Still having unprotected sex Given her risk, I went ahead and treated her with Rocephine 500 mg IM x 1 I  sent in Doxycycline to her pharmacy Oral and cervical GC/Chlamydia and Trich checked  HIV and RPR checked [place] + for leukocytes Urine culture sent Urine pregnancy test negative F/U with PCP for Depo shot STD prevention handout provided I will call her soon with her test results F/U as needed [KE]  1312 Tongue pain Hx of oral sex Oral GC/Chlamydia and Trich checked [KE]    Clinical Course User Index [KE] Doreene Eland, MD    Final Clinical Impressions(s) / UC Diagnoses   Final diagnoses:  Abdominal pain, unspecified abdominal location  Chlamydia infection  Tongue pain     Discharge Instructions       Nice seeing you today. I am sorry about your oral and abdominal symptoms. We collected samples to be checked for STDs. Given your untreated Chlamydia, we went ahead and treated you empirically with antibiotics. We gave you a shot here and sent Doxycycline to your pharmacy. We will contact you with the test results soon.      ED Prescriptions     Medication Sig Dispense Auth. Provider   doxycycline (VIBRAMYCIN) 100 MG capsule Take 1 capsule (100 mg total) by mouth 2 (two) times daily for 7 days. 14 capsule Doreene Eland, MD      PDMP not reviewed this encounter.   Doreene Eland, MD 07/15/23 1312    Doreene Eland, MD 07/15/23 1314

## 2023-07-16 ENCOUNTER — Other Ambulatory Visit: Payer: Self-pay

## 2023-07-16 ENCOUNTER — Encounter (HOSPITAL_COMMUNITY): Payer: Self-pay

## 2023-07-16 ENCOUNTER — Ambulatory Visit (HOSPITAL_COMMUNITY)
Admission: RE | Admit: 2023-07-16 | Discharge: 2023-07-16 | Disposition: A | Discharge status: Home / Self Care | Payer: 59 | Source: Ambulatory Visit | Attending: Internal Medicine | Admitting: Internal Medicine

## 2023-07-16 VITALS — BP 107/77 | HR 99 | Resp 18

## 2023-07-16 DIAGNOSIS — Z202 Contact with and (suspected) exposure to infections with a predominantly sexual mode of transmission: Secondary | ICD-10-CM | POA: Diagnosis not present

## 2023-07-16 LAB — RPR: RPR Ser Ql: NONREACTIVE

## 2023-07-16 LAB — CERVICOVAGINAL ANCILLARY ONLY
Chlamydia: NEGATIVE
Comment: NEGATIVE
Comment: NEGATIVE
Comment: NORMAL
Neisseria Gonorrhea: NEGATIVE
Trichomonas: NEGATIVE

## 2023-07-16 NOTE — ED Provider Notes (Signed)
[facility]    CSN: [id number] Arrival date & time: 07/16/23  1502      History   Chief Complaint Chief Complaint  Patient presents with   SEXUALLY TRANSMITTED DISEASE    Entered by patient    HPI Melissa [name] is a 22 y.o. female comes to the urgent care for follow-up of her STD screening.  Patient was evaluated for abdominal pain yesterday.  Cervicovaginal swab was negative for GC/chlamydia/trichomonas.  Patient was prescribed doxycycline yesterday.  She has had a prior swab which was positive for chlamydia.  Patient denies any vaginal discharge.  Abdominal pain is better.  No oral lesions or throat pain.   HPI  Past Medical History:  Diagnosis Date   Anemia    Anxiety    Asthma    inhaler. 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   Seizures ([name])    last one in 2015 - on meds   Sickle cell trait ([name])    TBI (traumatic brain injury) ([name]) 2006    Patient Active Problem List   Diagnosis Date Noted   MDD (major depressive disorder), recurrent severe, 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   Homelessness 03/29/2023   OCD (obsessive compulsive disorder) 11/23/2022   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   Delivery by emergency cesarean 06/05/2022   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   Vaginal bleeding during pregnancy 05/28/2022   Gonorrhea affecting pregnancy in second trimester 05/18/2022   ?fetal  pericardial effusion 05/18/2022   [redacted] weeks gestation of pregnancy    Preterm premature rupture of membranes (PPROM) with unknown onset of labor 05/17/2022   Oligohydramnios antepartum 05/11/2022   Abnormal antenatal AFP screen 04/12/2022   History of cesarean section 04/07/2022   Bartholin cyst 04/07/2022   History of herpes genitalis 04/07/2022   Supervision of high risk pregnancy, antepartum 02/08/2022   Trichomonal vaginitis during pregnancy in second trimester 02/01/2022   History of postpartum depression 11/16/2021   Abnormal involuntary movements 08/04/2020   Migraine with aura and without status migrainosus, not intractable 08/12/2019   Seizures ([name])    Sickle cell trait ([name]) 02/13/2019   Rh negative state in antepartum period 02/05/2019   Short interval between pregnancies affecting pregnancy in second trimester, antepartum 02/05/2019   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    Past Surgical History:  Procedure Laterality Date   APPENDECTOMY     CESAREAN SECTION N/A 08/31/2021   Procedure: CESAREAN SECTION;  Surgeon: Kathrynn Running, MD;  Location: [facility];  Service: Obstetrics;  Laterality: N/A;   CESAREAN SECTION N/A 06/05/2022   Procedure: CESAREAN SECTION;  Surgeon: Hermina Staggers, MD;  Location: [facility];  Service: Obstetrics;  Laterality: N/A;   HERNIA REPAIR     INTESTINAL MALROTATION REPAIR  2001    OB History     Gravida  4   Para  3   Term  2   Preterm  1   AB  1   Living  3      SAB  1   IAB      Ectopic      Multiple  0   Live Births  3            Home Medications    Prior to Admission medications    Medication Sig Start Date End Date Taking? Authorizing Provider  albuterol (VENTOLIN HFA) 108 (90 Base) MCG/ACT inhaler Inhale 1 puff into the lungs 4 (four) times daily as needed for wheezing or shortness of breath.    [provider]  busPIRone (BUSPAR) 5 MG tablet Take 1 tablet (5 mg total) by mouth 2 (two) times daily. 07/13/23   McLauchlin, Marylene Land, NP  doxycycline (VIBRAMYCIN) 100 MG capsule Take 1 capsule (100 mg total) by mouth 2 (two) times daily for 7 days. Patient not taking: Reported on 07/16/2023 07/15/23 07/22/23  Doreene Eland, MD  ferrous sulfate 325 (65 FE) MG tablet Take 1 tablet (325 mg total) by mouth at bedtime. Patient taking differently: Take 325 mg by mouth every other day. 04/14/22   Marylene Land, CNM  hydrOXYzine (ATARAX) 50 MG tablet Take 1 tablet (50 mg total) by mouth 3 (three) times daily as needed for anxiety. 03/14/23   Nwoko, Tommas Olp, PA  nicotine (NICODERM CQ - DOSED IN MG/24 HOURS) 21 mg/24hr patch Place 1 patch (21 mg total) onto the skin daily at 6 (six) AM. 07/14/23   McLauchlin, Marylene Land, NP  risperiDONE (RISPERDAL) 1 MG tablet Take 1 tablet (1 mg total) by mouth 2 (two) times daily. 07/13/23   McLauchlin, Marylene Land, NP  rizatriptan (MAXALT-MLT) 10 MG disintegrating tablet Take 1 tablet at onset of migraine with 2 ibuprofen may repeat an additional tablet in 2 hours if needed 05/07/20 06/09/20  Deetta Perla, MD    Family History Family History  Problem Relation Age of Onset   Depression Mother    Stroke Mother    Obesity Mother    Post-traumatic stress disorder Mother    Anxiety disorder Mother    Hypertension Mother    Diabetes Mother    Schizophrenia Father    Kidney disease Father     Social History Social History   Tobacco Use   Smoking status: Every Day    Current packs/day: 0.25    Types: Cigarettes   Smokeless tobacco: Never   Tobacco comments:    black and milds, 2 cigs daily  Vaping Use   Vaping status:  Former  Substance Use Topics   Alcohol use: Not Currently    Comment: occasionally, prior to pregnancy   Drug use: Not Currently    Types: Marijuana    Comment: last 2020     Allergies   Benadryl [diphenhydramine hcl], Haldol [haloperidol], Latex, Shellfish allergy, Zithromax [azithromycin], Camphor, Gluten meal, and Lactose intolerance (gi)   Review of Systems Review of Systems As per HPI  Physical Exam Triage Vital Signs ED Triage Vitals  Encounter Vitals Group     BP 07/16/23 1544 107/77     Systolic BP Percentile --      Diastolic BP Percentile --      Pulse Rate 07/16/23 1544 99  Resp 07/16/23 1544 18     Temp --      Temp src --      SpO2 --      Weight --      Height --      Head Circumference --      Peak Flow --      Pain Score 07/16/23 1539 8     Pain Loc --      Pain Education --      Exclude from Growth Chart --    No data found.  Updated Vital Signs BP 107/77 (BP Location: Right Arm)   Pulse 99   Resp 18   LMP 03/05/2023 (Approximate) Comment: Last injection was in July 2024  Visual Acuity Right Eye Distance:   Left Eye Distance:   Bilateral Distance:    Right Eye Near:   Left Eye Near:    Bilateral Near:     Physical Exam Vitals and nursing note reviewed.  Constitutional:      Appearance: She is not ill-appearing.  Cardiovascular:     Rate and Rhythm: Normal rate and regular rhythm.     Pulses: Normal pulses.     Heart sounds: Normal heart sounds.  Pulmonary:     Effort: Pulmonary effort is normal.     Breath sounds: Normal breath sounds.  Neurological:     Mental Status: She is alert.      UC Treatments / Results  Labs (all labs ordered are listed, but only abnormal results are displayed) Labs Reviewed - No data to display  EKG   Radiology No results found.  Procedures Procedures (including critical care time)  Medications Ordered in UC Medications - No data to display  Initial Impression / Assessment and Plan  / UC Course  I have reviewed the triage vital signs and the nursing notes.  Pertinent labs & imaging results that were available during my care of the patient were reviewed by me and considered in my medical decision making (see chart for details).     1.  Encounter for assessment of STD: Cervicovaginal swab is negative No need for Rocephin injection at this time Cytology is still pending-will call patient with recommendations if labs are abnormal. Safe sex practices advised Return precautions given. Final Clinical Impressions(s) / UC Diagnoses   Final diagnoses:  Encounter for assessment of STD exposure     Discharge Instructions      Your swab is negative for chlamydia, gonorrhea or trichomonas It is not unreasonable to complete the course of antibiotics since your prior swab was positive for chlamydia. Please return to urgent care if you have any other concerns.   ED Prescriptions   None    PDMP not reviewed this encounter.   Merrilee Jansky, MD 07/16/23 [phone]

## 2023-07-16 NOTE — ED Triage Notes (Signed)
Yesterday came to ucc to be seen.  Patient needed to get an inject of something starting in "R..".  patient did not stay due to kids being in car.

## 2023-07-16 NOTE — Discharge Instructions (Addendum)
Your swab is negative for chlamydia, gonorrhea or trichomonas It is not unreasonable to complete the course of antibiotics since your prior swab was positive for chlamydia. Please return to urgent care if you have any other concerns.

## 2023-07-17 LAB — CYTOLOGY, (ORAL, ANAL, URETHRAL) ANCILLARY ONLY
Chlamydia: NEGATIVE
Comment: NEGATIVE
Comment: NEGATIVE
Comment: NORMAL
Neisseria Gonorrhea: NEGATIVE
Trichomonas: NEGATIVE

## 2023-08-04 ENCOUNTER — Other Ambulatory Visit (HOSPITAL_COMMUNITY): Payer: Self-pay | Admitting: Physician Assistant

## 2023-08-04 DIAGNOSIS — F411 Generalized anxiety disorder: Secondary | ICD-10-CM

## 2023-08-06 DIAGNOSIS — J069 Acute upper respiratory infection, unspecified: Secondary | ICD-10-CM | POA: Diagnosis not present

## 2023-08-28 ENCOUNTER — Inpatient Hospital Stay (HOSPITAL_COMMUNITY)
Admission: AD | Admit: 2023-08-28 | Discharge: 2023-09-04 | DRG: 885 | Disposition: A | Discharge status: Home / Self Care | Payer: 59 | Source: Intra-hospital | Attending: Psychiatry | Admitting: Psychiatry

## 2023-08-28 ENCOUNTER — Encounter (HOSPITAL_COMMUNITY): Payer: Self-pay

## 2023-08-28 ENCOUNTER — Encounter (HOSPITAL_COMMUNITY): Payer: Self-pay | Admitting: Adult Health

## 2023-08-28 ENCOUNTER — Other Ambulatory Visit: Payer: Self-pay

## 2023-08-28 ENCOUNTER — Emergency Department (HOSPITAL_COMMUNITY)
Admission: EM | Admit: 2023-08-28 | Discharge: 2023-08-28 | Disposition: A | Discharge status: Other Acute Inpatient Hospital | Payer: 59 | Attending: Emergency Medicine | Admitting: Emergency Medicine

## 2023-08-28 ENCOUNTER — Ambulatory Visit (HOSPITAL_COMMUNITY): Payer: 59

## 2023-08-28 DIAGNOSIS — R45851 Suicidal ideations: Secondary | ICD-10-CM

## 2023-08-28 DIAGNOSIS — F323 Major depressive disorder, single episode, severe with psychotic features: Secondary | ICD-10-CM | POA: Insufficient documentation

## 2023-08-28 DIAGNOSIS — D72829 Elevated white blood cell count, unspecified: Secondary | ICD-10-CM | POA: Diagnosis not present

## 2023-08-28 DIAGNOSIS — F1010 Alcohol abuse, uncomplicated: Secondary | ICD-10-CM | POA: Diagnosis not present

## 2023-08-28 DIAGNOSIS — Z91148 Patient's other noncompliance with medication regimen for other reason: Secondary | ICD-10-CM

## 2023-08-28 DIAGNOSIS — F29 Unspecified psychosis not due to a substance or known physiological condition: Secondary | ICD-10-CM | POA: Diagnosis not present

## 2023-08-28 DIAGNOSIS — Z8249 Family history of ischemic heart disease and other diseases of the circulatory system: Secondary | ICD-10-CM

## 2023-08-28 DIAGNOSIS — F1090 Alcohol use, unspecified, uncomplicated: Secondary | ICD-10-CM | POA: Diagnosis present

## 2023-08-28 DIAGNOSIS — Z91013 Allergy to seafood: Secondary | ICD-10-CM | POA: Diagnosis not present

## 2023-08-28 DIAGNOSIS — Z885 Allergy status to narcotic agent status: Secondary | ICD-10-CM | POA: Diagnosis not present

## 2023-08-28 DIAGNOSIS — E119 Type 2 diabetes mellitus without complications: Secondary | ICD-10-CM | POA: Diagnosis present

## 2023-08-28 DIAGNOSIS — Z5900 Homelessness unspecified: Secondary | ICD-10-CM | POA: Insufficient documentation

## 2023-08-28 DIAGNOSIS — Z87820 Personal history of traumatic brain injury: Secondary | ICD-10-CM | POA: Diagnosis not present

## 2023-08-28 DIAGNOSIS — G4700 Insomnia, unspecified: Secondary | ICD-10-CM | POA: Diagnosis present

## 2023-08-28 DIAGNOSIS — F333 Major depressive disorder, recurrent, severe with psychotic symptoms: Secondary | ICD-10-CM | POA: Insufficient documentation

## 2023-08-28 DIAGNOSIS — E739 Lactose intolerance, unspecified: Secondary | ICD-10-CM | POA: Diagnosis not present

## 2023-08-28 DIAGNOSIS — E559 Vitamin D deficiency, unspecified: Secondary | ICD-10-CM | POA: Diagnosis present

## 2023-08-28 DIAGNOSIS — J45909 Unspecified asthma, uncomplicated: Secondary | ICD-10-CM | POA: Diagnosis not present

## 2023-08-28 DIAGNOSIS — Z62811 Personal history of psychological abuse in childhood: Secondary | ICD-10-CM

## 2023-08-28 DIAGNOSIS — E876 Hypokalemia: Secondary | ICD-10-CM | POA: Insufficient documentation

## 2023-08-28 DIAGNOSIS — Z046 Encounter for general psychiatric examination, requested by authority: Secondary | ICD-10-CM | POA: Diagnosis not present

## 2023-08-28 DIAGNOSIS — Z833 Family history of diabetes mellitus: Secondary | ICD-10-CM

## 2023-08-28 DIAGNOSIS — Z560 Unemployment, unspecified: Secondary | ICD-10-CM

## 2023-08-28 DIAGNOSIS — F250 Schizoaffective disorder, bipolar type: Principal | ICD-10-CM | POA: Diagnosis present

## 2023-08-28 DIAGNOSIS — F411 Generalized anxiety disorder: Secondary | ICD-10-CM | POA: Diagnosis present

## 2023-08-28 DIAGNOSIS — Z743 Need for continuous supervision: Secondary | ICD-10-CM | POA: Diagnosis not present

## 2023-08-28 DIAGNOSIS — Z823 Family history of stroke: Secondary | ICD-10-CM | POA: Diagnosis not present

## 2023-08-28 DIAGNOSIS — Z814 Family history of other substance abuse and dependence: Secondary | ICD-10-CM

## 2023-08-28 DIAGNOSIS — F17210 Nicotine dependence, cigarettes, uncomplicated: Secondary | ICD-10-CM | POA: Diagnosis present

## 2023-08-28 DIAGNOSIS — R9431 Abnormal electrocardiogram [ECG] [EKG]: Secondary | ICD-10-CM | POA: Diagnosis not present

## 2023-08-28 DIAGNOSIS — D573 Sickle-cell trait: Secondary | ICD-10-CM | POA: Diagnosis not present

## 2023-08-28 DIAGNOSIS — N390 Urinary tract infection, site not specified: Secondary | ICD-10-CM | POA: Diagnosis not present

## 2023-08-28 DIAGNOSIS — Z720 Tobacco use: Secondary | ICD-10-CM | POA: Diagnosis present

## 2023-08-28 DIAGNOSIS — Z881 Allergy status to other antibiotic agents status: Secondary | ICD-10-CM

## 2023-08-28 DIAGNOSIS — Z79899 Other long term (current) drug therapy: Secondary | ICD-10-CM

## 2023-08-28 DIAGNOSIS — B001 Herpesviral vesicular dermatitis: Secondary | ICD-10-CM | POA: Diagnosis present

## 2023-08-28 DIAGNOSIS — Z818 Family history of other mental and behavioral disorders: Secondary | ICD-10-CM | POA: Diagnosis not present

## 2023-08-28 DIAGNOSIS — Z91040 Latex allergy status: Secondary | ICD-10-CM | POA: Insufficient documentation

## 2023-08-28 DIAGNOSIS — K5900 Constipation, unspecified: Secondary | ICD-10-CM | POA: Diagnosis not present

## 2023-08-28 DIAGNOSIS — A6009 Herpesviral infection of other urogenital tract: Secondary | ICD-10-CM | POA: Diagnosis present

## 2023-08-28 DIAGNOSIS — Z841 Family history of disorders of kidney and ureter: Secondary | ICD-10-CM | POA: Diagnosis not present

## 2023-08-28 DIAGNOSIS — Y900 Blood alcohol level of less than 20 mg/100 ml: Secondary | ICD-10-CM | POA: Diagnosis not present

## 2023-08-28 DIAGNOSIS — F209 Schizophrenia, unspecified: Secondary | ICD-10-CM | POA: Insufficient documentation

## 2023-08-28 LAB — COMPREHENSIVE METABOLIC PANEL
ALT: 14 U/L (ref 0–44)
AST: 14 U/L — ABNORMAL LOW (ref 15–41)
Albumin: 3.6 g/dL (ref 3.5–5.0)
Alkaline Phosphatase: 95 U/L (ref 38–126)
Anion gap: 10 (ref 5–15)
BUN: 9 mg/dL (ref 6–20)
CO2: 22 mmol/L (ref 22–32)
Calcium: 9.4 mg/dL (ref 8.9–10.3)
Chloride: 105 mmol/L (ref 98–111)
Creatinine, Ser: 0.82 mg/dL (ref 0.44–1.00)
GFR, Estimated: >60 mL/min (ref >60)
Glucose, Bld: 98 mg/dL (ref 70–99)
Potassium: 3.2 mmol/L — ABNORMAL LOW (ref 3.5–5.1)
Sodium: 137 mmol/L (ref 135–145)
Total Bilirubin: 0.5 mg/dL (ref 0.0–1.2)
Total Protein: 7.4 g/dL (ref 6.5–8.1)

## 2023-08-28 LAB — RAPID URINE DRUG SCREEN, HOSP PERFORMED
Amphetamines: NOT DETECTED
Barbiturates: NOT DETECTED
Benzodiazepines: NOT DETECTED
Cocaine: NOT DETECTED
Opiates: NOT DETECTED
Tetrahydrocannabinol: NOT DETECTED

## 2023-08-28 LAB — CBC
HCT: 39.7 % (ref 36.0–46.0)
Hemoglobin: 13 g/dL (ref 12.0–15.0)
MCH: 26.1 pg (ref 26.0–34.0)
MCHC: 32.7 g/dL (ref 30.0–36.0)
MCV: 79.7 fL — ABNORMAL LOW (ref 80.0–100.0)
Platelets: 253 10*3/uL (ref 150–400)
RBC: 4.98 MIL/uL (ref 3.87–5.11)
RDW: 14.7 % (ref 11.5–15.5)
WBC: 10.6 10*3/uL — ABNORMAL HIGH (ref 4.0–10.5)
nRBC: 0 % (ref 0.0–0.2)

## 2023-08-28 LAB — HCG, SERUM, QUALITATIVE: Preg, Serum: NEGATIVE

## 2023-08-28 LAB — ACETAMINOPHEN LEVEL: Acetaminophen (Tylenol), Serum: <10 ug/mL — ABNORMAL LOW (ref 10–30)

## 2023-08-28 LAB — SALICYLATE LEVEL: Salicylate Lvl: <7 mg/dL — ABNORMAL LOW (ref 7.0–30.0)

## 2023-08-28 LAB — ETHANOL: Alcohol, Ethyl (B): <10 mg/dL (ref <10)

## 2023-08-28 MED ORDER — HYDROXYZINE HCL 50 MG PO TABS
50.0000 mg | ORAL_TABLET | Freq: Three times a day (TID) | ORAL | Status: DC | PRN
  Administered 2023-08-28: 50 mg via ORAL
  Filled 2023-08-28: qty 1

## 2023-08-28 MED ORDER — NICOTINE 21 MG/24HR TD PT24: 21.0000 mg | MEDICATED_PATCH | Freq: Every day | TRANSDERMAL | Status: DC

## 2023-08-28 MED ORDER — BUSPIRONE HCL 5 MG PO TABS
5.0000 mg | ORAL_TABLET | Freq: Two times a day (BID) | ORAL | Status: DC
  Administered 2023-08-28 – 2023-08-29 (×2): 5 mg via ORAL
  Filled 2023-08-28 (×7): qty 1

## 2023-08-28 MED ORDER — NICOTINE 21 MG/24HR TD PT24
21.0000 mg | MEDICATED_PATCH | Freq: Every day | TRANSDERMAL | Status: DC
  Administered 2023-08-30: 21 mg via TRANSDERMAL
  Filled 2023-08-28 (×10): qty 1

## 2023-08-28 MED ORDER — POTASSIUM CHLORIDE CRYS ER 20 MEQ PO TBCR
20.0000 meq | EXTENDED_RELEASE_TABLET | Freq: Once | ORAL | Status: AC
  Administered 2023-08-28: 20 meq via ORAL
  Filled 2023-08-28: qty 1

## 2023-08-28 MED ORDER — THIAMINE HCL 100 MG/ML IJ SOLN: 100.0000 mg | Freq: Every day | INTRAMUSCULAR | Status: DC

## 2023-08-28 MED ORDER — RISPERIDONE 1 MG PO TABS
1.0000 mg | ORAL_TABLET | Freq: Two times a day (BID) | ORAL | Status: DC
  Administered 2023-08-28 – 2023-08-29 (×2): 1 mg via ORAL
  Filled 2023-08-28 (×7): qty 1

## 2023-08-28 MED ORDER — LORAZEPAM 1 MG PO TABS
0.0000 mg | ORAL_TABLET | Freq: Two times a day (BID) | ORAL | Status: AC
Start: 2023-08-30 — End: 2023-09-01

## 2023-08-28 MED ORDER — HYDROXYZINE HCL 25 MG PO TABS: 50.0000 mg | ORAL_TABLET | Freq: Three times a day (TID) | ORAL | Status: DC | PRN

## 2023-08-28 MED ORDER — LORAZEPAM 1 MG PO TABS
0.0000 mg | ORAL_TABLET | Freq: Four times a day (QID) | ORAL | Status: AC
Start: 2023-08-29 — End: 2023-08-30
  Administered 2023-08-29: 2 mg via ORAL
  Administered 2023-08-30: 1 mg via ORAL
  Filled 2023-08-28: qty 2
  Filled 2023-08-28: qty 1

## 2023-08-28 MED ORDER — MAGNESIUM HYDROXIDE 400 MG/5ML PO SUSP: 30.0000 mL | Freq: Every day | ORAL | Status: DC | PRN

## 2023-08-28 MED ORDER — LORAZEPAM 2 MG/ML IJ SOLN: 0.0000 mg | Freq: Two times a day (BID) | INTRAMUSCULAR | Status: DC

## 2023-08-28 MED ORDER — ACETAMINOPHEN 325 MG PO TABS
650.0000 mg | ORAL_TABLET | Freq: Four times a day (QID) | ORAL | Status: DC | PRN
  Administered 2023-08-29: 650 mg via ORAL
  Filled 2023-08-28: qty 2

## 2023-08-28 MED ORDER — BUSPIRONE HCL 10 MG PO TABS: 5.0000 mg | ORAL_TABLET | Freq: Two times a day (BID) | ORAL | Status: DC

## 2023-08-28 MED ORDER — RISPERIDONE 1 MG PO TABS
1.0000 mg | ORAL_TABLET | Freq: Two times a day (BID) | ORAL | Status: DC
  Filled 2023-08-28: qty 1

## 2023-08-28 MED ORDER — LORAZEPAM 2 MG/ML IJ SOLN: 0.0000 mg | Freq: Four times a day (QID) | INTRAMUSCULAR | Status: DC

## 2023-08-28 MED ORDER — ALUM & MAG HYDROXIDE-SIMETH 200-200-20 MG/5ML PO SUSP: 30.0000 mL | ORAL | Status: DC | PRN

## 2023-08-28 MED ORDER — THIAMINE MONONITRATE 100 MG PO TABS
100.0000 mg | ORAL_TABLET | Freq: Every day | ORAL | Status: DC
  Administered 2023-08-28: 100 mg via ORAL
  Filled 2023-08-28: qty 1

## 2023-08-28 MED ORDER — LORAZEPAM 1 MG PO TABS: 0.0000 mg | ORAL_TABLET | Freq: Four times a day (QID) | ORAL | Status: DC

## 2023-08-28 MED ORDER — VITAMIN B-1 100 MG PO TABS
100.0000 mg | ORAL_TABLET | Freq: Every day | ORAL | Status: DC
  Administered 2023-08-29 – 2023-09-04 (×7): 100 mg via ORAL
  Filled 2023-08-28 (×10): qty 1

## 2023-08-28 MED ORDER — OLANZAPINE 5 MG PO TBDP: 5.0000 mg | ORAL_TABLET | Freq: Three times a day (TID) | ORAL | Status: DC | PRN

## 2023-08-28 MED ORDER — LORAZEPAM 1 MG PO TABS: 0.0000 mg | ORAL_TABLET | Freq: Two times a day (BID) | ORAL | Status: DC

## 2023-08-28 NOTE — ED Notes (Signed)
 Called  [facility] ETA  3 HOURS

## 2023-08-28 NOTE — [facility] Notes (Signed)
Patient did not attend the Wrap-up group. 

## 2023-08-28 NOTE — Consult Note (Addendum)
 [facility] Psychiatric Consult Initial  Patient Name: .Melissa [name]  MRN: [id number]  DOB: 2001/02/11  Consult Order details:  Orders (From admission, onward)     Start     Ordered   08/28/23 0702  CONSULT TO CALL ACT TEAM       Ordering Provider: Lorelle Aleck BROCKS, PA-C  Provider:  (Not yet assigned)  Question:  Reason for Consult?  Answer:  Psych consult   08/28/23 0702             Mode of Visit: Tele-visit Virtual Statement:TELE PSYCHIATRY ATTESTATION & CONSENT As the provider for this telehealth consult, I attest that I verified the patient's identity using two separate identifiers, introduced myself to the patient, provided my credentials, disclosed my location, and performed this encounter via a HIPAA-compliant, real-time, face-to-face, two-way, interactive audio and video platform and with the full consent and agreement of the patient (or guardian as applicable.) Patient physical location: [name]. Telehealth provider physical location: home office in state of [place].   Video start time: 1400 Video end time: 1456    Psychiatry Consult Evaluation  Service Date: August 28, 2023 LOS:  LOS: 0 days  Chief Complaint I've been feeling really stressed.  Primary Psychiatric Diagnoses  Major Depressive Disorder, recurrent with psychotic features  2.  Schizophrenia 3. Generalized Anxiety Disorder   Assessment  Melissa [name] is a 23 y.o. female admitted: Presented to the EDfor 08/28/2023  5:29 AM for evaluation of suicidal ideations. She carries the psychiatric diagnoses of MDD with psychotic features, PPD, GAD, OCD, and has a past medical history of  seizures, migraines. Hx of seizures last occurred 2015; she has not been  followed by neurologist since her previous provider retired.  She was taking tegretol , but stopped 4 year ago.    Her current presentation of command hallucinations, depressed mood, feelings of hopelessness, despair,  is most consistent  with her reported history for schizophrenia.  She has hx for command hallucinations, historically is better managed on psychotropic medications.  However, given she has not had medication in a few weeks, she reports feeling overwhelmed and verbalizes inability to use coping strategies.  She appears mentally decompensated and voluntarily accepts admission.  She meets criteria for inpatient psychiatric hospitalization based on above.  Current outpatient psychotropic medications include Risperdal  and Buspar  and historically she has had a good response to these medications. She was non compliant with medications prior to admission as evidenced by patient reports she has not had meds in a few weeks.   Patient seen via telepsych by this provider; chart reviewed and consulted with Dr. Zouev on 08/28/23.  On initial examination, patient Melissa [name] is observed laying in bed, appears asleep.  When greeted by this writer she raises the hob to make eye contact with this clinical research associate.  She is an AA female, her hair is neatly combed, no grooming deficits observed.  She is wearing burgundy hospital scrubs; she is not ill appearing and her appearance is not disheveled. She is alert/oriented x 4; Stated mood is,not that good appear, sad, withdrawn but cooperative; and mood congruent with affect.  Patient is speaking in a clear tone at low volume, and normal pace; with good eye contact.  Her thought process is coherent and relevant; There is no indication that she is currently responding to internal/external stimuli or experiencing delusional thought content.  Patient endorse hearing voices telling her to jump from a bridge prior to admission.  At present she  continues to endorses suicidal ideations.  She denies homicidal thoughts and does not appear paranoid. Patient has remained cooperative throughout assessment and has answered questions appropriately.    Please see plan below for detailed recommendations.    Diagnoses:  Active Hospital problems: Principal Problem:   Major depressive disorder, single episode, severe with psychotic features ([name]) Active Problems:   GAD (generalized anxiety disorder)   Schizophrenia ([name])    Plan   ## Psychiatric Medication Recommendations:  Restart home medications: -pending updated EKG and K+ placement -Risperidone  1mg  po BID for mood stability -Buspirone  5mg  po BID for anxiety -Nicotine  Patch 21mg  /24 hr patch daily  -Start: Lorazepam  0.5mg  po TID PRN for anxiety  ## Medical Decision Making Capacity:  Patient reports making her own decisions  ## Further Work-up:  -- as outlined below EKG or While pt on Qtc prolonging medications, please monitor & replete K+ to 4 and Mg2+ to 2 -- most recent EKG on 08/27/2022 had QtC of 425 -- Pertinent labwork reviewed earlier this admission includes: CMP, CBC, UDS --K+ was 3.2, will ask [name] provider to consider replacement before psychotropic medications are restarted.   ## Disposition:-- We recommend inpatient psychiatric hospitalization when medically cleared. Patient is under voluntary admission status at this time; please IVC if attempts to leave hospital.  ## Behavioral / Environmental: -Recommend using specific terminology regarding PNES, i.e. call the episodes non-epileptic seizures rather than pseudoseizures as the latter insinuates fake or feigned symptoms, when the events are a very real experience to the patient and are a physical, non-volitional, manifestation of fear, pain and anxiety.  or Utilize compassion and acknowledge the patient's experiences while setting clear and realistic expectations for care.    ## Safety and Observation Level:  - Based on my clinical evaluation, I estimate the patient to be at low risk of self harm in the current setting. - At this time, we recommend  routine. This decision is based on my review of the chart including patient's history and current presentation,  interview of the patient, mental status examination, and consideration of suicide risk including evaluating suicidal ideation, plan, intent, suicidal or self-harm behaviors, risk factors, and protective factors. This judgment is based on our ability to directly address suicide risk, implement suicide prevention strategies, and develop a safety plan while the patient is in the clinical setting. Please contact our team if there is a concern that risk level has changed.  CSSR Risk Category:C-SSRS RISK CATEGORY: High Risk  Suicide Risk Assessment: Patient has following modifiable risk factors for suicide: medication noncompliance, which we are addressing by referral for psychiatric admission, restarting psych medication and monitoring for safety and mood stability. Patient has following non-modifiable or demographic risk factors for suicide: history of suicide attempt, history of self harm behavior, and psychiatric hospitalization Patient has the following protective factors against suicide: Supportive family and Minor children in the home  Thank you for this consult request. Recommendations have been communicated to the primary team.  We will refer for psychiatric admission at this time.   Melissa FORBES Barefoot, NP       History of Present Illness  Relevant Aspects of Hospital [name] Course:  Admitted on 08/28/2023 for suicidal thoughts and plan to jump off a bridge.    Per [name] Provider Admission Assessment dated 08/27/2022:  Chief Complaint  Patient presents with   Suicidal    Chanley Mcenery [name] is a 23 y.o. female with a past medical history significant for anxiety, mild intellectual disability,  history of migraines, depression, and bipolar 1 disorder who presents to the [name] due to suicidal ideations that have been present for the past few days.  Patient notes she attempted to jump off a bridge earlier today.  Currently homeless and residing with a friend.  Denies HI and auditory/visual hallucinations.   Patient notes she is supposed to be on Risperdal  and BuSpar  however, has not taken her medications in 1 month.  Admits to drinking 4 shots daily.  Had 3 shots prior to arrival.  No history of alcohol withdrawal. Denies drug use. Patient notes she has had added stress given her parents currently have custody of her children and she is unable to see them frequently.    Was drinking when she had SI thought History obtained from patient and past medical records. No interpreter used during encounter.    Patient Report 08/27/2022:  She reports she did not sleep last night and does not want to talk much today.  However, after she's given anticipatory guidance, and positive regard extended, she agrees to talk with this clinical research associate.  She is appropriately interactive and provides relevant historical information.   She reports prior to admission, she was triggered by a verbal argument with her sister and her grandfather. Patient states she has been under a lot of stress, cites mounting psychosocial stressors that have her feeling hopeless, worthless, and in despair.  States she was enrolled in job [employer] but decided to leave one week ago because she missed her children and felt overwhelmed.  Apparently her family did not agree with her decision and have been pressuring me to go back.  She currently reports AH, telling me I don't belong here. She reports last night the voices told her to jump off a bridge.  States the voices are not new to her and she's experienced them before, one year ago. States she's usually able to pray or walk to distract herself but she was unable to do this prior to admission and believes this is because she's been off her medications.    She laments over her life choices.  She reports her parents have legal custody of her 3 children and she wants to regain custody.  She reports she has 2 one year olds and a four year old child. She does not have twins.  She reports her parents obtained custody  about one year ago when she was dealing with postpartum depression and hearing voices, voices in my head telling me to hurt my kids but I wouldn't do that.  She reports dx of depression, schizophrenia, and anxiety.   She reports she takes Risperdal  and Buspar , last had medications 2 weeks ago because she ran out.  She reports she gets meds at [facility] and has not been able to go get renewals.  She reports Risperdal  helped with voices and buspar  helped anxiety.     Psych ROS:  Depression: past and present Anxiety:  past and present Mania (lifetime and current): she has dx for bipolar 1 disorder in her chart but does not report sx congruent the dx.   Psychosis: (lifetime and current): past and present, hears voices  Collateral information:  Deferred as patient provided historical information.   Review of Systems  Constitutional: Negative.   HENT: Negative.    Eyes: Negative.   Respiratory: Negative.    Cardiovascular: Negative.   Gastrointestinal: Negative.   Genitourinary: Negative.   Musculoskeletal: Negative.   Skin: Negative.   Neurological: Negative.   Endo/Heme/Allergies: Negative.  Psychiatric/Behavioral:  Positive for depression, hallucinations and suicidal ideas. The patient is nervous/anxious.      Psychiatric and Social History  Psychiatric History:  Information collected from the patient and chart review  Prev Dx/Sx: depression, anxiety, schizophrenia Current Psych Provider: risperidone , buspar  Home Meds (current): as outlined above Previous Med Trials: denies Therapy: not established for therapy  Prior Psych Hospitalization: 5   Prior Self Harm: used to cut her wrist, 4-5 years ago admits she was trying to commit suicide.  Prior Violence: denies  Family Psych History: Maternal grandmother-schizophrenia Paternal Grandfather and Father have schizophrenia Family Hx suicide: 2015 she overdosed on seizure meds.   Social History:  Developmental Hx: reports she met  all developmental milestones. Educational Hx: 12th grade HS diploma Occupational Hx: Was in job [employer] but left one week ago d/t feeling overwhelmed. Not working d/t mental health states her mother usually supports her financially Legal Hx: denies Living Situation: she is staying with a girlfriend; no Spiritual Hx: denies Access to weapons/lethal means: denies   Substance History Alcohol: yes  Type of alcohol wine color and tea; did not feel she drank enough to get drunk Last Drink: prior to admission Number of drinks per day she reports she drinks daily to dull the pain x 2 weeks History of alcohol withdrawal seizures denies History of DT's denies Tobacco: cigarettes, smokes 1/2 daily x 3 years Illicit drugs: denies Prescription drug abuse: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 rate 18, height 5' 7 (1.702 m), weight 113.4 kg, SpO2 100%, not currently breastfeeding. Body mass index is 39.16 kg/m.  Physical Exam Cardiovascular:     Rate and Rhythm: Normal rate.     Pulses: Normal pulses.  Pulmonary:     Effort: Pulmonary effort is normal.  Musculoskeletal:        General: Normal range of motion.     Cervical back: Normal range of motion.  Neurological:     Mental Status: She is alert and oriented to person, place, and time.  Psychiatric:        Attention and Perception: Attention normal. She perceives auditory hallucinations.        Mood and Affect: Mood is anxious and depressed. Affect is labile and tearful.        Speech: Speech normal.         Behavior: Behavior is slowed. Behavior is cooperative.        Thought Content: Thought content is not paranoid or delusional. Thought content includes suicidal ideation. Thought content does not include homicidal ideation. Thought content includes suicidal plan. Thought content does not include homicidal plan.        Cognition and Memory: Cognition normal. Memory is impaired.        Judgment: Judgment is impulsive.     Mental Status Exam: General Appearance: Fairly Groomed  Orientation:  Full (Time, Place, and Person)  Memory:  Immediate;   Good Recent;   Fair Remote;   Fair  Concentration:  Concentration: Fair and Attention Span: Fair  Recall:  Good  Attention  Good  Eye Contact:  Good  Speech:  Clear and Coherent and Slow  Language:  Good  Volume:  Decreased  Mood: I'm not feeling that good  Affect:  Congruent, Depressed, Labile, and Tearful  Thought Process:  Goal Directed  Thought Content:  Hallucinations: Auditory  Suicidal Thoughts:  Yes.  with intent/plan  Homicidal Thoughts:  No  Judgement:  Fair  Insight:  Fair  Psychomotor Activity:  Decreased  Akathisia:  No  Fund of Knowledge:  Good      Assets:  Communication Skills Desire for Improvement Social Support Others:  has 3children whom she reports as my reason for living  Cognition:  WNL  ADL's:  Intact  AIMS (if indicated):        Other History   These have been pulled in through the EMR, reviewed, and updated if appropriate.  Family History:  The patient's family history includes Anxiety disorder in her mother; Depression in her mother; Diabetes in her mother; Hypertension in her mother; Kidney disease in her father; Obesity in her mother; Post-traumatic stress disorder in her mother; Schizophrenia in her father; Stroke in her mother.  Medical History: Past Medical History:  Diagnosis Date   Anemia    Anxiety    Asthma    inhaler. 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/17/2021   Seizures ([name])    last one in 2015 - on meds   Sickle cell trait ([name])    TBI (traumatic brain injury) ([name]) 2006    Surgical History: Past Surgical History:  Procedure Laterality Date   APPENDECTOMY     CESAREAN SECTION N/A 08/31/2021   Procedure: CESAREAN SECTION;  Surgeon: Kandis Devaughn Sayres, MD;  Location: [facility] LD ORS;  Service: Obstetrics;  Laterality: N/A;   CESAREAN SECTION N/A 06/05/2022   Procedure: CESAREAN SECTION;  Surgeon: Lorence Ozell CROME, MD;  Location: [facility] LD ORS;  Service: Obstetrics;  Laterality: N/A;   HERNIA REPAIR     INTESTINAL MALROTATION REPAIR  2001     Medications:   Current Facility-Administered Medications:    LORazepam  (ATIVAN ) injection 0-4 mg, 0-4 mg, Intravenous, Q6H **OR** LORazepam  (ATIVAN ) tablet 0-4 mg, 0-4 mg, Oral, Q6H, Aberman, Caroline C, PA-C   [START ON 08/30/2023] LORazepam  (ATIVAN ) injection 0-4 mg, 0-4 mg, Intravenous, Q12H **OR** [START ON 08/30/2023] LORazepam  (ATIVAN ) tablet 0-4 mg, 0-4 mg, Oral, Q12H, Aberman, Caroline C, PA-C   thiamine  (VITAMIN B1) tablet 100 mg, 100 mg, Oral, Daily, 100 mg at 08/28/23 0941 **OR** thiamine  (VITAMIN B1) injection 100 mg, 100 mg, Intravenous, Daily, Aberman, Caroline C, PA-C  Current Outpatient Medications:    busPIRone  (BUSPAR ) 5 MG tablet, Take 1 tablet (5 mg total) by mouth 2 (two) times daily. (Patient taking differently: Take 5 mg by mouth 2 (two) times daily as needed (anxiety).), Disp: 28 tablet, Rfl: 0    hydrOXYzine  (ATARAX ) 50 MG tablet, Take 1 tablet (50 mg total) by mouth 3 (three) times daily as needed  for anxiety., Disp: 75 tablet, Rfl: 1   nicotine  (NICODERM CQ  - DOSED IN MG/24 HOURS) 21 mg/24hr patch, Place 1 patch (21 mg total) onto the skin daily at 6 (six) AM., Disp: 14 patch, Rfl: 0   risperiDONE  (RISPERDAL ) 1 MG tablet, Take 1 tablet (1 mg total) by mouth 2 (two) times daily., Disp: 28 tablet, Rfl: 0  Allergies: Allergies  Allergen Reactions   Benadryl  [Diphenhydramine  Hcl] Other (See Comments)    Seizures    Haldol  [Haloperidol ] Anaphylaxis and Swelling    Tongue swelling   Latex Swelling and Other (See Comments)    Labial edema after usage of latex condoms.    Shellfish Allergy Anaphylaxis and Other (See Comments)    Throat tightening   Zithromax [Azithromycin] Anaphylaxis and Swelling   Camphor Other (See Comments)    Unknown reaction   Gluten Meal Other (See Comments)    GI Intolerance   Lactose Intolerance (Gi) Diarrhea    Melissa FORBES Barefoot, NP

## 2023-08-28 NOTE — ED Triage Notes (Signed)
 Pt reports having feelings of self harm pt reports that she almost jumped off of a bridge.

## 2023-08-28 NOTE — ED Notes (Signed)
 Report called to Adult Nursing Pod and given to [place], [place].

## 2023-08-28 NOTE — Consult Note (Signed)
 Reached out to PA-C Aberman and Delorise Royals, RN requesting to see patient for psych assessment via tts cart.  Per RN Acquanetta Belling, she's been busy with other patient care.  She will alert this Clinical research associate when she's able to get the cart to this patient for evaluation.

## 2023-08-28 NOTE — Progress Notes (Signed)
 Pt was accepted to [facility] TODAY 08/28/2023; Bed Assignment 307-2 PENDING Signed Voluntary consent uploaded to chart or faxed to [facility] [phone], EKG, and K+ replacement.  Pt meets inpatient criteria per Bernadette Moishe PIETY  Attending Physician will be Dr. Rankin Montes, MD   Report can be called to: -Adult unit: [phone]  Pt can arrive after: Uponc completion of PENDING items.  Care Team notified: [facility] Danika Riley, RN,Marcus Cougar, Ireti Omoseebi,RN, Vanessa Fiscus,RN, Shnese Bowdle, Tunisha M[place]ane, LCSWA, Treana Forester, Devinny Henderson, LCSWA 08/28/2023 @ 4:43 PM

## 2023-08-28 NOTE — ED Notes (Signed)
 Finished with TTS, finished with meal. Watching TV.

## 2023-08-28 NOTE — ED Provider Notes (Signed)
 [place] [facility] AT [facility] Provider Note   CSN: [id number] Arrival date & time: 08/28/23  9473     History  Chief Complaint  Patient presents with   Suicidal    Melissa [name] is a 23 y.o. female with a past medical history significant for anxiety, mild intellectual disability, history of migraines, depression, and bipolar 1 disorder who presents to the ED due to suicidal ideations that have been present for the past few days.  Patient notes she attempted to jump off a bridge earlier today.  Currently homeless and residing with a friend.  Denies HI and auditory/visual hallucinations.  Patient notes she is supposed to be on Risperdal  and BuSpar  however, has not taken her medications in 1 month.  Admits to drinking 4 shots daily.  Had 3 shots prior to arrival.  No history of alcohol withdrawal. Denies drug use. Patient notes she has had added stress given her parents currently have custody of her children and she is unable to see them frequently.   History obtained from patient and past medical records. No interpreter used during encounter.      Home Medications Prior to Admission medications   Medication Sig Start Date End Date Taking? Authorizing Provider  albuterol  (VENTOLIN  HFA) 108 (90 Base) MCG/ACT inhaler Inhale 1 puff into the lungs 4 (four) times daily as needed for wheezing or shortness of breath.    [provider]  busPIRone  (BUSPAR ) 5 MG tablet Take 1 tablet (5 mg total) by mouth 2 (two) times daily. 07/13/23   McLauchlin, Jon, NP  ferrous sulfate  325 (65 FE) MG tablet Take 1 tablet (325 mg total) by mouth at bedtime. Patient taking differently: Take 325 mg by mouth every other day. 04/14/22   Kooistra, Kathryn Lorraine, CNM  hydrOXYzine  (ATARAX ) 50 MG tablet Take 1 tablet (50 mg total) by mouth 3 (three) times daily as needed for anxiety. 03/14/23   Nwoko, Uchenna E, PA  nicotine  (NICODERM CQ  - DOSED IN MG/24 HOURS) 21 mg/24hr  patch Place 1 patch (21 mg total) onto the skin daily at 6 (six) AM. 07/14/23   McLauchlin, Jon, NP  risperiDONE  (RISPERDAL ) 1 MG tablet Take 1 tablet (1 mg total) by mouth 2 (two) times daily. 07/13/23   McLauchlin, Angela, NP  rizatriptan  (MAXALT -MLT) 10 MG disintegrating tablet Take 1 tablet at onset of migraine with 2 ibuprofen  may repeat an additional tablet in 2 hours if needed 05/07/20 06/09/20  Susen Elsie DEL, MD      Allergies    Benadryl  [diphenhydramine  hcl], Haldol  [haloperidol ], Latex, Shellfish allergy, Zithromax [azithromycin], Camphor, Gluten meal, and Lactose intolerance (gi)    Review of Systems   Review of Systems  Constitutional:  Negative for fever.  Respiratory:  Negative for shortness of breath.   Cardiovascular:  Negative for chest pain.  Psychiatric/Behavioral:  Positive for suicidal ideas. Negative for hallucinations.     Physical Exam Updated Vital Signs BP 125/80 (BP Location: Right Arm)   Pulse 92   Temp 98.3 F (36.8 C) (Oral)   Resp 16   Ht 5' 7 (1.702 m)   Wt 113.4 kg   SpO2 100%   BMI 39.16 kg/m  Physical Exam Vitals and nursing note reviewed.  Constitutional:      General: She is not in acute distress.    Appearance: She is not ill-appearing.  HENT:     Head: Normocephalic.  Eyes:     Pupils: Pupils are equal, round, and reactive  to light.  Cardiovascular:     Rate and Rhythm: Normal rate and regular rhythm.     Pulses: Normal pulses.     Heart sounds: Normal heart sounds. No murmur heard.    No friction rub. No gallop.  Pulmonary:     Effort: Pulmonary effort is normal.     Breath sounds: Normal breath sounds.  Abdominal:     General: Abdomen is flat. There is no distension.     Palpations: Abdomen is soft.     Tenderness: There is no abdominal tenderness. There is no guarding or rebound.  Musculoskeletal:        General: Normal range of motion.     Cervical back: Neck supple.  Skin:    General: Skin is warm and dry.   Neurological:     General: No focal deficit present.     Mental Status: She is alert.  Psychiatric:        Mood and Affect: Mood normal.        Behavior: Behavior normal.        Thought Content: Thought content includes suicidal ideation. Thought content includes suicidal plan.     ED Results / Procedures / Treatments   Labs (all labs ordered are listed, but only abnormal results are displayed) Labs Reviewed  COMPREHENSIVE METABOLIC PANEL - Abnormal; Notable for the following components:      Result Value   Potassium 3.2 (*)    AST 14 (*)    All other components within normal limits  SALICYLATE LEVEL - Abnormal; Notable for the following components:   Salicylate Lvl <7.0 (*)    All other components within normal limits  ACETAMINOPHEN  LEVEL - Abnormal; Notable for the following components:   Acetaminophen  (Tylenol ), Serum <10 (*)    All other components within normal limits  CBC - Abnormal; Notable for the following components:   WBC 10.6 (*)    MCV 79.7 (*)    All other components within normal limits  ETHANOL  RAPID URINE DRUG SCREEN, HOSP PERFORMED  HCG, SERUM, QUALITATIVE    EKG None  Radiology No results found.  Procedures Procedures    Medications Ordered in ED Medications  LORazepam  (ATIVAN ) injection 0-4 mg (has no administration in time range)    Or  LORazepam  (ATIVAN ) tablet 0-4 mg (has no administration in time range)  LORazepam  (ATIVAN ) injection 0-4 mg (has no administration in time range)    Or  LORazepam  (ATIVAN ) tablet 0-4 mg (has no administration in time range)  thiamine  (VITAMIN B1) tablet 100 mg (has no administration in time range)    Or  thiamine  (VITAMIN B1) injection 100 mg (has no administration in time range)  potassium chloride  SA (KLOR-CON  M) CR tablet 20 mEq (has no administration in time range)    ED Course/ Medical Decision Making/ A&P                                 Medical Decision Making Amount and/or Complexity of Data  Reviewed External Data Reviewed: notes. Labs: ordered. Decision-making details documented in ED Course.  Risk OTC drugs. Prescription drug management.   This patient presents to the ED for concern of SI, this involves an extensive number of treatment options, and is a complaint that carries with it a high risk of complications and morbidity.  The differential diagnosis includes psychiatric illness, metabolic derangement, infection, etc  23 year old female presents to the  ED due to suicidal ideations.  Notes she attempted to jump off a bridge earlier today.  Admits to drinking 4 shots daily.  Had 3 shots prior to arrival.  Upon arrival, patient noted to be tachycardic however, upon recheck during initial evaluation heart rate normalized.  Denies history of alcohol withdrawal.  Patient in no acute distress.  Benign physical exam.  No evidence of alcohol withdrawal. Patient denies HI and auditory/visual hallucinations.  CIWA protocol ordered.  Medical clearance labs ordered in triage.  CBC significant for mild leukocytosis at 10.6.  Normal hemoglobin.  CMP reassuring.  Normal renal function.  Mild hypokalemia 3.2.  Potassium repleted. UDS negative.  Pregnancy test negative.  Acetaminophen , salicylate, and ethanol level within normal limits.  Patient has been medically cleared for TTS evaluation.  The patient has been placed in psychiatric observation due to the need to provide a safe environment for the patient while obtaining psychiatric consultation and evaluation, as well as ongoing medical and medication management to treat the patient's condition.  The patient has not been placed under full IVC at this time.  Co morbidities that complicate the patient evaluation  Anxiety, depression, bipolar 1 disorder  Social Determinants of Health:  homeless  Test / Admission - Considered:  Admission vs. Discharge pending TTS evaluation        Final Clinical Impression(s) / ED  Diagnoses Final diagnoses:  Suicidal ideation  Alcohol abuse    Rx / DC Orders ED Discharge Orders     None         Lorelle Aleck JAYSON DEVONNA 08/28/23 0708    Albertina Dixon, MD 08/28/23 2138

## 2023-08-28 NOTE — Consult Note (Signed)
 Attempted to see patient for psych assessment via tts cart.  Sent secure message to Delorise Royals, RN caring for patient.  Currently awaiting response.

## 2023-08-28 NOTE — ED Notes (Signed)
TTS initiated

## 2023-08-28 NOTE — Tx Team (Signed)
 Initial Treatment Plan 08/28/2023 9:30 PM [name] [id number]    PATIENT STRESSORS: Loss of custody of 3 children   Substance abuse     PATIENT STRENGTHS: Supportive family/friends    PATIENT IDENTIFIED PROBLEMS: Loss of custody of children  Depression and sadness                    DISCHARGE CRITERIA:  Ability to meet basic life and health needs  PRELIMINARY DISCHARGE PLAN: Return to previous living arrangement Return to previous work or school arrangements  PATIENT/FAMILY INVOLVEMENT: This treatment plan has been presented to and reviewed with the patient, Melissa [name], and/or family member.  The patient and family have been given the opportunity to ask questions and make suggestions.  Jeronimo DELENA Sprang, RN 08/28/2023, 9:30 PM

## 2023-08-28 NOTE — ED Notes (Addendum)
 Arrives with sitter to purple 51, steady gait to sitting in chair, alert, NAD, calm, cooperative. Sitter present. Belongings noted to be in locker #2.

## 2023-08-28 NOTE — ED Triage Notes (Signed)
 Patient's belongings placed in locker #2 in purple zone

## 2023-08-28 NOTE — ED Notes (Signed)
Patient resting in bed, no s/s of distress, will continue to monitor.  

## 2023-08-29 ENCOUNTER — Encounter (HOSPITAL_COMMUNITY): Payer: Self-pay

## 2023-08-29 DIAGNOSIS — Z720 Tobacco use: Secondary | ICD-10-CM | POA: Diagnosis present

## 2023-08-29 DIAGNOSIS — F250 Schizoaffective disorder, bipolar type: Principal | ICD-10-CM

## 2023-08-29 DIAGNOSIS — F1090 Alcohol use, unspecified, uncomplicated: Secondary | ICD-10-CM | POA: Diagnosis present

## 2023-08-29 MED ORDER — POTASSIUM CHLORIDE CRYS ER 20 MEQ PO TBCR
40.0000 meq | EXTENDED_RELEASE_TABLET | Freq: Once | ORAL | Status: AC
  Administered 2023-08-29: 40 meq via ORAL
  Filled 2023-08-29 (×2): qty 2

## 2023-08-29 MED ORDER — ONDANSETRON 4 MG PO TBDP
4.0000 mg | ORAL_TABLET | Freq: Four times a day (QID) | ORAL | Status: AC | PRN
  Administered 2023-08-30: 4 mg via ORAL
  Filled 2023-08-29: qty 1

## 2023-08-29 MED ORDER — RISPERIDONE 1 MG PO TABS
1.0000 mg | ORAL_TABLET | Freq: Two times a day (BID) | ORAL | Status: DC
Start: 2023-08-29 — End: 2023-08-31
  Administered 2023-08-29 – 2023-08-31 (×4): 1 mg via ORAL
  Filled 2023-08-29 (×10): qty 1

## 2023-08-29 MED ORDER — OLANZAPINE 10 MG IM SOLR: 5.0000 mg | Freq: Three times a day (TID) | INTRAMUSCULAR | Status: DC | PRN

## 2023-08-29 MED ORDER — HYDROXYZINE HCL 25 MG PO TABS
25.0000 mg | ORAL_TABLET | Freq: Three times a day (TID) | ORAL | Status: DC | PRN
  Administered 2023-08-29 – 2023-09-04 (×4): 25 mg via ORAL
  Filled 2023-08-29 (×2): qty 1

## 2023-08-29 MED ORDER — OLANZAPINE 5 MG PO TBDP: 5.0000 mg | ORAL_TABLET | Freq: Three times a day (TID) | ORAL | Status: DC | PRN

## 2023-08-29 MED ORDER — TRAZODONE HCL 50 MG PO TABS
50.0000 mg | ORAL_TABLET | Freq: Every evening | ORAL | Status: DC | PRN
  Administered 2023-08-29 – 2023-09-03 (×5): 50 mg via ORAL
  Filled 2023-08-29 (×5): qty 1

## 2023-08-29 MED ORDER — VITAMIN B-1 100 MG PO TABS: 100.0000 mg | ORAL_TABLET | Freq: Every day | ORAL | Status: DC

## 2023-08-29 MED ORDER — WHITE PETROLATUM EX OINT
TOPICAL_OINTMENT | CUTANEOUS | Status: AC
  Filled 2023-08-29: qty 5

## 2023-08-29 MED ORDER — LOPERAMIDE HCL 2 MG PO CAPS: 2.0000 mg | ORAL_CAPSULE | ORAL | Status: AC | PRN

## 2023-08-29 MED ORDER — ADULT MULTIVITAMIN W/MINERALS CH
1.0000 | ORAL_TABLET | Freq: Every day | ORAL | Status: DC
  Administered 2023-08-29 – 2023-09-04 (×7): 1 via ORAL
  Filled 2023-08-29 (×11): qty 1

## 2023-08-29 MED ORDER — NICOTINE POLACRILEX 2 MG MT GUM: 2.0000 mg | CHEWING_GUM | OROMUCOSAL | Status: DC | PRN

## 2023-08-29 MED ORDER — OLANZAPINE 10 MG IM SOLR: 10.0000 mg | Freq: Three times a day (TID) | INTRAMUSCULAR | Status: DC | PRN

## 2023-08-29 MED ORDER — ALBUTEROL SULFATE HFA 108 (90 BASE) MCG/ACT IN AERS
1.0000 | INHALATION_SPRAY | RESPIRATORY_TRACT | Status: DC | PRN
  Filled 2023-08-29: qty 6.7

## 2023-08-29 MED ORDER — BUSPIRONE HCL 5 MG PO TABS
5.0000 mg | ORAL_TABLET | Freq: Three times a day (TID) | ORAL | Status: DC
  Administered 2023-08-29 – 2023-08-30 (×3): 5 mg via ORAL
  Filled 2023-08-29 (×11): qty 1

## 2023-08-29 MED ORDER — HYDROXYZINE HCL 25 MG PO TABS
25.0000 mg | ORAL_TABLET | Freq: Four times a day (QID) | ORAL | Status: AC | PRN
  Filled 2023-08-29 (×2): qty 1

## 2023-08-29 NOTE — Progress Notes (Signed)
 D: Patient is alert, oriented, and cooperative. Patient has not felt well today. Denies SI, HI, AVH, and verbally contracts for safety.   0800 CIWA 12 1400 CIWA 3   A: Scheduled medications administered per MD order. Support provided. Patient educated on safety on the unit and medications. Routine safety checks every 15 minutes. Patient stated understanding to tell nurse about any new physical symptoms. Patient understands to tell staff of any needs.     R: No adverse drug reactions noted. Patient verbally contracts for safety. Patient remains safe at this time and will continue to monitor.    08/29/23 1400  Psych Admission Type (Psych Patients Only)  Admission Status Voluntary  Psychosocial Assessment  Patient Complaints Anxiety;Substance abuse  Eye Contact Brief  Facial Expression Flat;Grimacing  Affect Depressed  Speech Logical/coherent;Slow;Soft  Interaction Forwards little  Motor Activity Slow  Appearance/Hygiene Disheveled  Behavior Characteristics Cooperative  Mood Depressed;Anxious  Thought Process  Coherency WDL  Content WDL  Delusions None reported or observed  Perception WDL  Hallucination None reported or observed  Judgment Impaired  Confusion None  Danger to Self  Current suicidal ideation? Denies  Self-Injurious Behavior No self-injurious ideation or behavior indicators observed or expressed   Agreement Not to Harm Self Yes  Description of Agreement verbal  Danger to Others  Danger to Others None reported or observed

## 2023-08-29 NOTE — Progress Notes (Signed)
 Patient in [facility] for worsening depression and SI with plan to jump off of a bridge. Patient had been away a job corp, and returned home in December for the holiday. Since her return she reportedly has been drinking 1/2 pint of alcohol per day. At the end of the holiday season, patient was due to return to job corp, but wanted to remain at home close to her 3 children who are in the custody of her parents. The night before she was to leave she spent drinking and crying and had thoughts of jumping over a bridge. She had a previous attempt at suicide by OD on her medications in 2024. Patient presented disheveled, and in scrubs. She was tearful and sad, with depressive affect. She was logical/coherent, denied paranoia, auditory/visual hallucinations. Continues to endorse thoughts of suicide at this time. She lives with her friend, intends to move back upon discharge. Admission information discussed, patient verbalized understanding. Oriented to the unit, and shown to her room.

## 2023-08-29 NOTE — [facility] Suicide Risk Assessment (Signed)
 Suicide Risk Assessment  Admission Assessment    [facility] Admission Suicide Risk Assessment   Nursing information obtained from:  Patient Demographic factors:  Unemployed, Low socioeconomic status Current Mental Status:  Suicidal ideation indicated by patient Loss Factors:  Decrease in vocational status Historical Factors:  Prior suicide attempts Risk Reduction Factors:  Positive social support  Total Time spent with patient: 1.5 hours Principal Problem: Schizoaffective disorder, bipolar type ([name]) Diagnosis:  Principal Problem:   Schizoaffective disorder, bipolar type ([name]) Active Problems:   GAD (generalized anxiety disorder)   Alcohol use disorder   Tobacco use  Subjective Data: SI with plan to jump off bridge  Continued Clinical Symptoms: depressed mood, anhedonia, insomnia, psychomotor retardation, fatigue, feelings of worthlessness/guilt, difficulty concentrating, hopelessness, recurrent thoughts of death, suicidal thoughts with specific plan, suicidal attempt, anxiety, panic attacks, loss of energy/fatigue, disturbed sleep, (Hypo) Manic Symptoms:  Distractibility, Hallucinations, Anxiety Symptoms:  Excessive Worry, Psychotic Symptoms:  Hallucinations: Auditory  Alcohol Use Disorder Identification Test Final Score (AUDIT): 25 The Alcohol Use Disorders Identification Test, Guidelines for Use in Primary Care, Second Edition.  World Science Writer [facility]). Score between 0-7:  no or low risk or alcohol related problems. Score between 8-15:  moderate risk of alcohol related problems. Score between 16-19:  high risk of alcohol related problems. Score 20 or above:  warrants further diagnostic evaluation for alcohol dependence and treatment.  CLINICAL FACTORS:   Alcohol/Substance Abuse/Dependencies Schizophrenia:   Depressive state More than one psychiatric diagnosis Unstable or Poor Therapeutic Relationship Previous Psychiatric Diagnoses and  Treatments  Musculoskeletal: Strength & Muscle Tone: within normal limits Gait & Station: normal Patient leans: N/A  Psychiatric Specialty Exam:  Presentation  General Appearance:  Disheveled  Eye Contact: Minimal  Speech: Clear and Coherent  Speech Volume: Decreased  Handedness: Right   Mood and Affect  Mood: Depressed; Anxious  Affect: Congruent   Thought Process  Thought Processes: Coherent  Descriptions of Associations:Intact  Orientation:Full (Time, Place and Person)  Thought Content:Logical  History of Schizophrenia/Schizoaffective disorder:No  Duration of Psychotic Symptoms:No data recorded Hallucinations:Hallucinations: Auditory Description of Auditory Hallucinations: a single female voice commanding to kill herself-last AH was 2 days ago  Ideas of Reference:None  Suicidal Thoughts:Suicidal Thoughts: Yes, Passive SI Passive Intent and/or Plan: Without Intent; Without Plan  Homicidal Thoughts:Homicidal Thoughts: No   Sensorium  Memory: Immediate Fair  Judgment: Fair  Insight: Fair   Chartered Certified Accountant: Fair  Attention Span: Fair  Recall: [employer] of Knowledge: Fair  Language: Fair   Psychomotor Activity  Psychomotor Activity: Psychomotor Activity: Normal   Assets  Assets: Resilience   Sleep  Sleep: Sleep: Poor  Physical Exam: Physical Exam Vitals and nursing note reviewed.  Constitutional:      Appearance: Normal appearance.  HENT:     Head: Normocephalic.  Musculoskeletal:     Cervical back: Normal range of motion.  Neurological:     General: No focal deficit present.     Mental Status: She is alert and oriented to person, place, and time.  Psychiatric:        Behavior: Behavior normal.    Review of Systems  Psychiatric/Behavioral:  Positive for depression, hallucinations, substance abuse and suicidal ideas. Negative for memory loss. The patient is nervous/anxious and has  insomnia.   All other systems reviewed and are negative.  Blood pressure (!) 143/96, pulse 86, temperature 98.6 F (37 C), temperature source Oral, resp. rate 20, height 5' 7 (1.702 m), weight 115.7 kg, SpO2  100%, not currently breastfeeding. Body mass index is 39.94 kg/m.   COGNITIVE FEATURES THAT CONTRIBUTE TO RISK:  None    SUICIDE RISK:   Severe:  Frequent, intense, and enduring suicidal ideation, specific plan, no subjective intent, but some objective markers of intent (i.e., choice of lethal method), the method is accessible, some limited preparatory behavior, evidence of impaired self-control, severe dysphoria/symptomatology, multiple risk factors present, and few if any protective factors, particularly a lack of social support.  PLAN OF CARE: See H & P  I certify that inpatient services furnished can reasonably be expected to improve the patient's condition.   Donia Snell, NP 08/29/2023, 3:52 PM

## 2023-08-29 NOTE — BH IP Treatment Plan (Signed)
 Interdisciplinary Treatment and Diagnostic Plan Update  08/29/2023 Time of Session: 10:40 AM Melissa [name] MRN: [id number]  Principal Diagnosis: <principal problem not specified>  Secondary Diagnoses: Active Problems:   MDD (major depressive disorder), recurrent, severe, with psychosis ([name])   Current Medications:  Current Facility-Administered Medications  Medication Dose Route Frequency Provider Last Rate Last Admin   acetaminophen  (TYLENOL ) tablet 650 mg  650 mg Oral Q6H PRN Mills, Shnese E, NP   650 mg at 08/29/23 0630   alum & mag hydroxide-simeth (MAALOX/MYLANTA) 200-200-20 MG/5ML suspension 30 mL  30 mL Oral Q4H PRN Mills, Shnese E, NP       busPIRone  (BUSPAR ) tablet 5 mg  5 mg Oral BID Mills, Shnese E, NP   5 mg at 08/29/23 9176   hydrOXYzine  (ATARAX ) tablet 25 mg  25 mg Oral Q6H PRN Massengill, Rankin, MD       loperamide  (IMODIUM ) capsule 2-4 mg  2-4 mg Oral PRN Massengill, Rankin, MD       LORazepam  (ATIVAN ) tablet 0-4 mg  0-4 mg Oral Q6H Mills, Shnese E, NP   2 mg at 08/29/23 0823   [START ON 08/30/2023] LORazepam  (ATIVAN ) tablet 0-4 mg  0-4 mg Oral Q12H Mills, Shnese E, NP       magnesium  hydroxide (MILK OF MAGNESIA) suspension 30 mL  30 mL Oral Daily PRN Mills, Shnese E, NP       multivitamin with minerals tablet 1 tablet  1 tablet Oral Daily Massengill, Nathan, MD   1 tablet at 08/29/23 1205   nicotine  (NICODERM CQ  - dosed in mg/24 hours) patch 21 mg  21 mg Transdermal Q0600 Mills, Shnese E, NP       OLANZapine  (ZYPREXA ) injection 10 mg  10 mg Intramuscular TID PRN Massengill, Rankin, MD       OLANZapine  (ZYPREXA ) injection 5 mg  5 mg Intramuscular TID PRN Massengill, Rankin, MD       OLANZapine  zydis (ZYPREXA ) disintegrating tablet 5 mg  5 mg Oral TID PRN Johny Rankin, MD       ondansetron  (ZOFRAN -ODT) disintegrating tablet 4 mg  4 mg Oral Q6H PRN Massengill, Nathan, MD       risperiDONE  (RISPERDAL ) tablet 1 mg  1 mg Oral BID Mills, Shnese E, NP   1 mg at  08/29/23 9176   thiamine  (Vitamin B-1) tablet 100 mg  100 mg Oral Daily Mills, Shnese E, NP   100 mg at 08/29/23 9176   PTA Medications: Medications Prior to Admission  Medication Sig Dispense Refill Last Dose/Taking   busPIRone  (BUSPAR ) 5 MG tablet Take 1 tablet (5 mg total) by mouth 2 (two) times daily. (Patient taking differently: Take 5 mg by mouth 2 (two) times daily as needed (anxiety).) 28 tablet 0    hydrOXYzine  (ATARAX ) 50 MG tablet Take 1 tablet (50 mg total) by mouth 3 (three) times daily as needed for anxiety. 75 tablet 1    nicotine  (NICODERM CQ  - DOSED IN MG/24 HOURS) 21 mg/24hr patch Place 1 patch (21 mg total) onto the skin daily at 6 (six) AM. 14 patch 0    risperiDONE  (RISPERDAL ) 1 MG tablet Take 1 tablet (1 mg total) by mouth 2 (two) times daily. 28 tablet 0     Patient Stressors: Loss of custody of 3 children   Substance abuse    Patient Strengths: Supportive family/friends   Treatment Modalities: Medication Management, Group therapy, Case management,  1 to 1 session with clinician, Psychoeducation, Recreational therapy.   Physician  Treatment Plan for Primary Diagnosis: <principal problem not specified> Long Term Goal(s):     Short Term Goals:    Medication Management: Evaluate patient's response, side effects, and tolerance of medication regimen.  Therapeutic Interventions: 1 to 1 sessions, Unit Group sessions and Medication administration.  Evaluation of Outcomes: Not Progressing  Physician Treatment Plan for Secondary Diagnosis: Active Problems:   MDD (major depressive disorder), recurrent, severe, with psychosis ([name])  Long Term Goal(s):     Short Term Goals:       Medication Management: Evaluate patient's response, side effects, and tolerance of medication regimen.  Therapeutic Interventions: 1 to 1 sessions, Unit Group sessions and Medication administration.  Evaluation of Outcomes: Not Progressing   RN Treatment Plan for Primary Diagnosis:  <principal problem not specified> Long Term Goal(s): Knowledge of disease and therapeutic regimen to maintain health will improve  Short Term Goals: Ability to remain free from injury will improve, Ability to verbalize frustration and anger appropriately will improve, Ability to demonstrate self-control, Ability to participate in decision making will improve, Ability to verbalize feelings will improve, Ability to disclose and discuss suicidal ideas, Ability to identify and develop effective coping behaviors will improve, and Compliance with prescribed medications will improve  Medication Management: RN will administer medications as ordered by provider, will assess and evaluate patient's response and provide education to patient for prescribed medication. RN will report any adverse and/or side effects to prescribing provider.  Therapeutic Interventions: 1 on 1 counseling sessions, Psychoeducation, Medication administration, Evaluate responses to treatment, Monitor vital signs and CBGs as ordered, Perform/monitor CIWA, COWS, AIMS and Fall Risk screenings as ordered, Perform wound care treatments as ordered.  Evaluation of Outcomes: Not Progressing   LCSW Treatment Plan for Primary Diagnosis: <principal problem not specified> Long Term Goal(s): Safe transition to appropriate next level of care at discharge, Engage patient in therapeutic group addressing interpersonal concerns.  Short Term Goals: Engage patient in aftercare planning with referrals and resources, Increase social support, Increase ability to appropriately verbalize feelings, Increase emotional regulation, Facilitate acceptance of mental health diagnosis and concerns, Facilitate patient progression through stages of change regarding substance use diagnoses and concerns, and Identify triggers associated with mental health/substance abuse issues  Therapeutic Interventions: Assess for all discharge needs, 1 to 1 time with Social worker,  Explore available resources and support systems, Assess for adequacy in community support network, Educate family and significant other(s) on suicide prevention, Complete Psychosocial Assessment, Interpersonal group therapy.  Evaluation of Outcomes: Not Progressing   Progress in Treatment: Attending groups: No. Participating in groups: No. Taking medication as prescribed: Yes. Toleration medication: Yes. Family/Significant other contact made:  consents pending Patient understands diagnosis: Yes. Discussing patient identified problems/goals with staff: Yes. Medical problems stabilized or resolved: Yes. Denies suicidal/homicidal ideation: Yes. Issues/concerns per patient self-inventory: No.  New problem(s) identified:  No  New Short Term/Long Term Goal(s):    medication stabilization, elimination of SI thoughts, development of comprehensive mental wellness plan.    Patient Goals:  to get help with alcohol use and depression.  Patient would like a referral to a psychiatrist and therapist in [place].  Discharge Plan or Barriers:  Patient recently admitted. CSW will continue to follow and assess for appropriate referrals and possible discharge planning.    Reason for Continuation of Hospitalization: Depression Medication stabilization Suicidal ideation  Estimated Length of Stay:  5 - 7 days   Last 3 [place] Suicide Severity Risk Score: Flowsheet Row [facility] Most recent reading at 08/28/2023  8:29 PM ED from 08/28/2023 in [facility] at [facility] Most recent reading at 08/28/2023  3:55 PM ED from 07/16/2023 in [facility] at [facility] Most recent reading at 07/16/2023  3:43 PM  C-SSRS RISK CATEGORY High Risk High Risk No Risk       Last PHQ 2/9 Scores:    03/14/2023    9:21 AM 01/24/2023    8:32 AM 04/07/2022   10:55 AM  Depression screen PHQ 2/9  Decreased  Interest 1 2 1   Down, Depressed, Hopeless 2 1 1   PHQ - 2 Score 3 3 2   Altered sleeping 1 3 0  Tired, decreased energy 3 2 1   Change in appetite 2 3 0  Feeling bad or failure about yourself  1 3 0  Trouble concentrating 1 2 1   Moving slowly or fidgety/restless 1 2 1   Suicidal thoughts 1 1 0  PHQ-9 Score 13 19 5   Difficult doing work/chores Not difficult at all Somewhat difficult     Scribe for Treatment Team: Zacharius Funari O Dewaine Morocho, LCSWA 08/29/2023 12:17 PM

## 2023-08-29 NOTE — H&P (Signed)
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 back: Normal range of motion.  Neurological:     General: No focal deficit present.     Mental Status: She is alert and oriented to person, place, and time.  Psychiatric:        Behavior: Behavior normal.    Review of Systems  Psychiatric/Behavioral:  Positive for depression, hallucinations, substance abuse and suicidal ideas. Negative for memory loss. The patient is nervous/anxious and has insomnia.   All other systems reviewed and are negative.  Blood pressure (!) 143/96, pulse 86, temperature 98.6 F (37 C), temperature source Oral, resp. rate 20, height 5' 7 (1.702 m), weight 115.7 kg, SpO2 100%, not currently  breastfeeding. Body mass index is 39.94 kg/m.  Treatment Plan Summary: Daily contact with patient to assess and evaluate symptoms and progress in treatment and Medication management  Safety and Monitoring: Voluntary admission to inpatient psychiatric unit for safety, stabilization and treatment Daily contact with patient to assess and evaluate symptoms and progress in treatment Patient's case to be discussed in multi-disciplinary team meeting Observation Level : q15 minute checks Vital signs: q12 hours Precautions: Safety  Long Term Goal(s): Improvement in symptoms so as ready for discharge  Short Term Goals: Ability to identify changes in lifestyle to reduce recurrence of condition will improve, Ability to verbalize feelings will improve, Ability to disclose and discuss suicidal ideas, Ability to demonstrate self-control will improve, Ability to identify and develop effective coping behaviors will improve, Ability to maintain clinical measurements within normal limits will improve, Compliance with prescribed medications will improve, and Ability to identify triggers associated with substance abuse/mental health issues will improve  Diagnoses Principal Problem:   Schizoaffective disorder, bipolar type ([name]) Active Problems:   GAD (generalized anxiety disorder)   Alcohol use disorder   Tobacco use  Medications -Change home Risperdal  from 2 mg daily to 1 mg  BID -Continue Buspar  5 mg Q 8 H for GAD -Change Ativan  0-4mg  Q ^ H as per the sliding scale-See MAR -Start Albuterol  inhaler Q 4 H PRN for wheezing/SOB -Start Nicotine  transdermal patch of nicotine  dependence    PRNS -Start Hydroxyzine  25 mg Q 6 H PRN for anxiety -Start Trazodone  50 mg nightly PRN for sleep -Start Nicorette  gum 2 mg PRN for nicotine  cravings -Continue Agitation Protocol medications as per the MAR: Zyprexa / PO/IM PRN -Continue Tylenol  650 mg every 6 hours PRN for mild pain -Continue Maalox 30 mg every 4 hrs  PRN for indigestion -Continue Milk of Magnesia as needed every 6 hrs for constipation  Labs Review: Repeating BMP in the morning due to low K of 3.2. Given 40 MEQ x 1 dose. Ordered TSH, Ha1c, Vit D levels, B12, lipid panel in the morning. EKG with Qtc WNL  Discharge Planning: Social work and case management to assist with discharge planning and identification of hospital follow-up needs prior to discharge Estimated LOS: 5-7 days Discharge Concerns: Need to establish a safety plan; Medication compliance and effectiveness Discharge Goals: Return home with outpatient referrals for mental health follow-up including medication management/psychotherapy  I certify that inpatient services furnished can reasonably be expected to improve the patient's condition.    Donia Snell, NP 1/8/20253:48 PM

## 2023-08-29 NOTE — Group Note (Signed)
 Date:  08/29/2023 Time:  9:23 PM  Group Topic/Focus:  Wrap-Up Group:   The focus of this group is to help patients review their daily goal of treatment and discuss progress on daily workbooks.    Participation Level:  Did Not Attend  Participation Quality:   N/A  Affect:   N/A  Cognitive:   N/A  Insight: None  Engagement in Group:   N/A  Modes of Intervention:   N/A  Additional Comments:  Patient did not attend wrap up group.   Eward Mace 08/29/2023, 9:23 PM

## 2023-08-29 NOTE — [facility] Group Notes (Signed)
 [facility] Group Notes:  (Nursing/MHT/Case Management/Adjunct)  Date:  08/29/2023  Time:  2000  Type of Therapy:   Narcotics Anonymous Meeting  Participation Level:  Active  Participation Quality:  Attentive  Affect:  Anxious and Depressed  Cognitive:  Alert  Insight:  Improving  Engagement in Group:   Attentive  Modes of Intervention:  Education and Support  Summary of Progress/Problems:  Melissa [name] 08/29/2023, 9:50 PM

## 2023-08-29 NOTE — Plan of Care (Signed)
  Problem: Education: Goal: Knowledge of [facility] Education information/materials will improve Outcome: Progressing Goal: Emotional status will improve Outcome: Progressing Goal: Mental status will improve Outcome: Progressing Goal: Verbalization of understanding the information provided will improve Outcome: Progressing   Problem: Coping: Goal: Ability to demonstrate self-control will improve Outcome: Progressing   Problem: Safety: Goal: Periods of time without injury will increase Outcome: Progressing

## 2023-08-29 NOTE — Progress Notes (Signed)
 CSW attempted to complete PSA, Pt asleep d/t not feeling well. CSW will attempt again tomorrow.   Steffanie Dunn [facility] 08/28/22 @ 11:27

## 2023-08-29 NOTE — Group Note (Signed)
 Recreation Therapy Group Note   Group Topic:Stress Management  Group Date: 08/29/2023 Start Time: 0935 End Time: 1005 Facilitators: Neyah Ellerman-McCall, LRT,CTRS Location: [address] Dayroom   Group Topic: Stress Management   Goal Area(s) Addresses:  Patient will actively participate in stress management techniques presented during session.  Patient will successfully identify benefit of practicing stress management post d/c.   Intervention: Relaxation exercise with ambient sound and script   Group Description: Guided Imagery. LRT provided education, instruction, and demonstration on practice of visualization via guided imagery. Patient was asked to participate in the technique introduced during session. LRT debriefed including topics of mindfulness, stress management and specific scenarios each patient could use these techniques. Patients were given suggestions of ways to access scripts post d/c and encouraged to explore Youtube and other apps available on smartphones, tablets, and computers.  Education:  Stress Management, Discharge Planning.    Affect/Mood: N/A   Participation Level: Did not attend    Clinical Observations/Individualized Feedback:      Plan: Continue to engage patient in RT group sessions 2-3x/week.   Kym Scannell-McCall, LRT,CTRS  08/29/2023 12:28 PM

## 2023-08-30 DIAGNOSIS — F250 Schizoaffective disorder, bipolar type: Secondary | ICD-10-CM | POA: Diagnosis not present

## 2023-08-30 LAB — URINALYSIS, ROUTINE W REFLEX MICROSCOPIC
Bilirubin Urine: NEGATIVE
Glucose, UA: NEGATIVE mg/dL
Hgb urine dipstick: NEGATIVE
Ketones, ur: NEGATIVE mg/dL
Nitrite: NEGATIVE
Protein, ur: NEGATIVE mg/dL
Specific Gravity, Urine: 1.015 (ref 1.005–1.030)
pH: 7 (ref 5.0–8.0)

## 2023-08-30 LAB — BASIC METABOLIC PANEL
Anion gap: 9 (ref 5–15)
BUN: 13 mg/dL (ref 6–20)
CO2: 21 mmol/L — ABNORMAL LOW (ref 22–32)
Calcium: 9.1 mg/dL (ref 8.9–10.3)
Chloride: 105 mmol/L (ref 98–111)
Creatinine, Ser: 0.76 mg/dL (ref 0.44–1.00)
GFR, Estimated: >60 mL/min (ref >60)
Glucose, Bld: 104 mg/dL — ABNORMAL HIGH (ref 70–99)
Potassium: 3.7 mmol/L (ref 3.5–5.1)
Sodium: 135 mmol/L (ref 135–145)

## 2023-08-30 LAB — HEMOGLOBIN A1C
Hgb A1c MFr Bld: 5.4 % (ref 4.8–5.6)
Mean Plasma Glucose: 108.28 mg/dL

## 2023-08-30 LAB — LIPID PANEL
Cholesterol: 142 mg/dL (ref 0–200)
HDL: 35 mg/dL — ABNORMAL LOW (ref >40)
LDL Cholesterol: 86 mg/dL (ref 0–99)
Total CHOL/HDL Ratio: 4.1 {ratio}
Triglycerides: 104 mg/dL (ref <150)
VLDL: 21 mg/dL (ref 0–40)

## 2023-08-30 LAB — TSH: TSH: 1.251 u[IU]/mL (ref 0.350–4.500)

## 2023-08-30 LAB — VITAMIN B12: Vitamin B-12: 349 pg/mL (ref 180–914)

## 2023-08-30 LAB — VITAMIN D 25 HYDROXY (VIT D DEFICIENCY, FRACTURES): Vit D, 25-Hydroxy: 6.15 ng/mL — ABNORMAL LOW (ref 30–100)

## 2023-08-30 MED ORDER — FLUOXETINE HCL 10 MG PO CAPS
10.0000 mg | ORAL_CAPSULE | Freq: Every day | ORAL | Status: DC
  Filled 2023-08-30 (×7): qty 1

## 2023-08-30 MED ORDER — BUSPIRONE HCL 10 MG PO TABS
10.0000 mg | ORAL_TABLET | Freq: Three times a day (TID) | ORAL | Status: DC
  Administered 2023-08-30 – 2023-09-03 (×12): 10 mg via ORAL
  Filled 2023-08-30 (×5): qty 1
  Filled 2023-08-30: qty 2
  Filled 2023-08-30 (×9): qty 1
  Filled 2023-08-30: qty 2
  Filled 2023-08-30 (×3): qty 1

## 2023-08-30 MED ORDER — VITAMIN D (ERGOCALCIFEROL) 1.25 MG (50000 UNIT) PO CAPS
50000.0000 [IU] | ORAL_CAPSULE | ORAL | Status: DC
Start: 2023-08-30 — End: 2023-09-04
  Administered 2023-08-30: 50000 [IU] via ORAL
  Filled 2023-08-30 (×2): qty 1

## 2023-08-30 NOTE — Progress Notes (Signed)
 [facility] MD Progress Note  08/30/2023 2:23 PM Melissa [name]  MRN:  [id number]  HPI: Patient is a 23 yo African American female with prior mental health diagnoses of bipolar d/o, MDD & GAD who presented to the [facility] on 08/28/23 with complaints of SI with a plan to jump off a bridge in the context of non medication adherence, ETOH abuse & psychosocial stressors. Pt was transferred and admitted to this [facility] voluntarily on 08/27/2022 for treatment and stabilization of his mental status.   24 hr chart review: Pt has been compliant with meds, V/S are wNL. Required Hydroxyzine  as PRN earlier today morning and last night for anxiety, required Trazodone  50 mg last night for insomnia. No behavioral episodes last night as per nursing documentation and reports. Slept for 7.25 hrs last night as per nursing documentation.  Patient assessment note: On assessment today, the pt reports that their mood is still very depressed. She rates depression 6, 10 being worst.  Reports that anxiety is less than at time of admission, but still high. She rates it 6, 10 being worst.  Sleep is fair  Appetite is fair  Concentration is poor   Energy level is poor  Denies suicidal thoughts. Denies suicidal intent and plan.  Denies having any HI.  Denies having psychotic symptoms.   Denies having side effects to current psychiatric medications.   We discussed changes to current medication regimen, including increasing Buspar  from 5 mg TID to 10 mg TID. We will add an antidepressant to pt's medication regimen for management of depressive symptoms. Pt denies any recent mania, has not exhibited any manic type symptoms since this hospitalization. Starting Prozac  10 mg daily to medication regimen.  Discussed the following psychosocial stressors: Homelessness & not having her children with her because they are in the custody of her parents. Pt shared that she is able to go there and see the children, but cannot live there because  her parents have custody. She talked about going back to residing with a friend at discharge, and talked about not wanting to go to rehab for alcohol use, even though positive reinforcements were given for her to go to rehab after discharge.  Principal Problem: Schizoaffective disorder, bipolar type ([name]) Diagnosis: Principal Problem:   Schizoaffective disorder, bipolar type ([name]) Active Problems:   GAD (generalized anxiety disorder)   Alcohol use disorder   Tobacco use  Total Time spent with patient: 45 minutes  Past Psychiatric History: See H & P  Past Medical History:  Past Medical History:  Diagnosis Date   Anemia    Anxiety    Asthma    inhaler. 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   Seizures ([name])    last one in 2015 - on meds   Sickle cell trait ([name])    TBI (traumatic brain injury) ([name]) 2006    Past Surgical History:  Procedure Laterality Date   APPENDECTOMY     CESAREAN SECTION N/A 08/31/2021  Procedure: CESAREAN SECTION;  Surgeon: Kandis Devaughn Sayres, MD;  Location: [facility] LD ORS;  Service: Obstetrics;  Laterality: N/A;   CESAREAN SECTION N/A 06/05/2022   Procedure: CESAREAN SECTION;  Surgeon: Lorence Ozell CROME, MD;  Location: [facility] LD ORS;  Service: Obstetrics;  Laterality: N/A;   HERNIA REPAIR     INTESTINAL MALROTATION REPAIR  2001   Family History:  Family History  Problem Relation Age of Onset   Depression Mother    Stroke Mother    Obesity Mother     Post-traumatic stress disorder Mother    Anxiety disorder Mother    Hypertension Mother    Diabetes Mother    Schizophrenia Father    Kidney disease Father    Family Psychiatric  History: As listed above Social History:  Social History   Substance and Sexual Activity  Alcohol Use Not Currently   Comment: occasionally, prior to pregnancy     Social History   Substance and Sexual Activity  Drug Use Not Currently   Types: Marijuana   Comment: last 2020    Social History   Socioeconomic History   Marital status: Single    Spouse name: Not on file   Number of children: Not on file   Years of education: Not on file   Highest education level: Not on file  Occupational History   Not on file  Tobacco Use   Smoking status: Every Day    Current packs/day: 0.25    Types: Cigarettes   Smokeless tobacco: Never   Tobacco comments:    black and milds, 2 cigs daily  Vaping Use   Vaping status: Former  Substance and Sexual Activity   Alcohol use: Not Currently    Comment: occasionally, prior to pregnancy   Drug use: Not Currently    Types: Marijuana    Comment: last 2020   Sexual activity: Not Currently    Partners: Male    Birth control/protection: None    Comment: missed shot for a few months, planning to go get shots in december  Other Topics Concern   Not on file  Social History Narrative   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   She attended [employer].    She lives with her parents, siblings, and grandfather.    She enjoys writing, singing, dancing, cooking, and shopping.   She attends [employer].   Social Drivers of Corporate Investment Banker Strain: Not on file  Food Insecurity: No Food Insecurity (08/28/2023)   Hunger Vital Sign    Worried About Running Out of Food in the Last Year: Never true    Ran Out of Food in the Last Year: Never true  Transportation Needs: No Transportation Needs (08/28/2023)   PRAPARE - Scientist, Research (physical Sciences) (Medical): No    Lack of Transportation (Non-Medical): No  Physical Activity: Not on file  Stress: Stress Concern Present (08/08/2022)   Received from [facility], [facility]   [facility] of Occupational Health - Occupational Stress Questionnaire    Feeling of Stress : To some extent  Social Connections: Moderately Integrated (08/28/2023)   Social Connection and Isolation Panel [NHANES]    Frequency of Communication with Friends and Family: More than three times a week    Frequency of Social Gatherings with Friends and Family: More than three times a week    Attends Religious Services: 1 to 4 times per year    Active Member of [employer]  or Organizations: No    Attends Banker Meetings: Never    Marital Status: Married   Sleep: Fair  Appetite:  Fair  Current Medications: Current Facility-Administered Medications  Medication Dose Route Frequency Provider Last Rate Last Admin   acetaminophen  (TYLENOL ) tablet 650 mg  650 mg Oral Q6H PRN Mills, Shnese E, NP   650 mg at 08/29/23 0630   albuterol  (VENTOLIN  HFA) 108 (90 Base) MCG/ACT inhaler 1-2 puff  1-2 puff Inhalation Q4H PRN Massengill, Rankin, MD       alum & mag hydroxide-simeth (MAALOX/MYLANTA) 200-200-20 MG/5ML suspension 30 mL  30 mL Oral Q4H PRN Mills, Shnese E, NP       busPIRone  (BUSPAR ) tablet 10 mg  10 mg Oral TID Doll Frazee, NP       FLUoxetine  (PROZAC ) capsule 10 mg  10 mg Oral Daily Anala Whisenant, NP       hydrOXYzine  (ATARAX ) tablet 25 mg  25 mg Oral Q6H PRN Massengill, Rankin, MD       hydrOXYzine  (ATARAX ) tablet 25 mg  25 mg Oral TID PRN Johny Rankin, MD   25 mg at 08/30/23 0640   loperamide  (IMODIUM ) capsule 2-4 mg  2-4 mg Oral PRN Massengill, Rankin, MD       LORazepam  (ATIVAN ) tablet 0-4 mg  0-4 mg Oral Q12H Mills, Shnese E, NP       magnesium  hydroxide (MILK OF MAGNESIA) suspension 30 mL  30 mL Oral Daily PRN Mills, Shnese E, NP       multivitamin with minerals tablet 1  tablet  1 tablet Oral Daily Massengill, Nathan, MD   1 tablet at 08/30/23 9044   nicotine  (NICODERM CQ  - dosed in mg/24 hours) patch 21 mg  21 mg Transdermal Q0600 Mills, Shnese E, NP   21 mg at 08/30/23 9044   nicotine  polacrilex (NICORETTE ) gum 2 mg  2 mg Oral PRN Massengill, Rankin, MD       OLANZapine  (ZYPREXA ) injection 10 mg  10 mg Intramuscular TID PRN Massengill, Rankin, MD       OLANZapine  (ZYPREXA ) injection 5 mg  5 mg Intramuscular TID PRN Massengill, Rankin, MD       OLANZapine  zydis (ZYPREXA ) disintegrating tablet 5 mg  5 mg Oral TID PRN Johny Rankin, MD       ondansetron  (ZOFRAN -ODT) disintegrating tablet 4 mg  4 mg Oral Q6H PRN Massengill, Rankin, MD   4 mg at 08/30/23 1008   risperiDONE  (RISPERDAL ) tablet 1 mg  1 mg Oral Q12H Massengill, Rankin, MD   1 mg at 08/30/23 0955   thiamine  (Vitamin B-1) tablet 100 mg  100 mg Oral Daily Mills, Shnese E, NP   100 mg at 08/30/23 9043   traZODone  (DESYREL ) tablet 50 mg  50 mg Oral QHS PRN Johny Rankin, MD   50 mg at 08/29/23 2115   Vitamin D  (Ergocalciferol ) (DRISDOL ) 1.25 MG (50000 UNIT) capsule 50,000 Units  50,000 Units Oral Q7 days Tex Drilling, NP        Lab Results:  Results for orders placed or performed during the hospital encounter of 08/28/23 (from the past 48 hours)  Basic metabolic panel     Status: Abnormal   Collection Time: 08/30/23  6:38 AM  Result Value Ref Range   Sodium 135 135 - 145 mmol/L   Potassium 3.7 3.5 - 5.1 mmol/L   Chloride 105 98 - 111 mmol/L   CO2 21 (L) 22 - 32 mmol/L   Glucose, Bld 104 (H)  70 - 99 mg/dL    Comment: Glucose reference range applies only to samples taken after fasting for at least 8 hours.   BUN 13 6 - 20 mg/dL   Creatinine, Ser 9.23 0.44 - 1.00 mg/dL   Calcium  9.1 8.9 - 10.3 mg/dL   GFR, Estimated >39 >39 mL/min    Comment: (NOTE) Calculated using the CKD-EPI Creatinine Equation (2021)    Anion gap 9 5 - 15    Comment: Performed at [facility], [address] [place], [place] [address]  TSH     Status: None   Collection Time: 08/30/23  6:38 AM  Result Value Ref Range   TSH 1.251 0.350 - 4.500 uIU/mL    Comment: Performed by a 3rd Generation assay with a functional sensitivity of <=0.01 uIU/mL. Performed at [facility], [address] [place], [place] [address]   Lipid panel     Status: Abnormal   Collection Time: 08/30/23  6:38 AM  Result Value Ref Range   Cholesterol 142 0 - 200 mg/dL   Triglycerides 895 <849 mg/dL   HDL 35 (L) >59 mg/dL   Total CHOL/HDL Ratio 4.1 RATIO   VLDL 21 0 - 40 mg/dL   LDL Cholesterol 86 0 - 99 mg/dL    Comment:        Total Cholesterol/HDL:CHD Risk Coronary Heart Disease Risk Table                     Men   Women  1/2 Average Risk   3.4   3.3  Average Risk       5.0   4.4  2 X Average Risk   9.6   7.1  3 X Average Risk  23.4   11.0        Use the calculated Patient Ratio above and the CHD Risk Table to determine the patient's CHD Risk.        ATP III CLASSIFICATION (LDL):  <100     mg/dL   Optimal  899-870  mg/dL   Near or Above                    Optimal  130-159  mg/dL   Borderline  839-810  mg/dL   High  >809     mg/dL   Very High Performed at [facility], [address], [place], [place] [address]   Hemoglobin A1c     Status: None   Collection Time: 08/30/23  6:38 AM  Result Value Ref Range   Hgb A1c MFr Bld 5.4 4.8 - 5.6 %    Comment: (NOTE) Pre diabetes:          5.7%-6.4%  Diabetes:              >6.4%  Glycemic control for   <7.0% adults with diabetes    Mean Plasma Glucose 108.28 mg/dL    Comment: Performed at [facility], [address], [place], [place] [address]  VITAMIN D  25 Hydroxy (Vit-D Deficiency, Fractures)     Status: Abnormal   Collection Time: 08/30/23  6:38 AM  Result Value Ref Range   Vit D, 25-Hydroxy 6.15 (L) 30 - 100 ng/mL    Comment: (NOTE) Vitamin D  deficiency has been defined by the Institute of  Medicine  and an Endocrine Society practice guideline as a level of serum 25-OH  vitamin D  less than 20 ng/mL (1,2). The Endocrine  Society went on to  further define vitamin D  insufficiency as a level between 21 and 29  ng/mL (2).  1. IOM (Institute of Medicine). 2010. Dietary reference intakes for  calcium  and D. [place]: The [employer]. 2. Holick MF, Binkley McIntosh, Bischoff-Ferrari HA, et al. Evaluation,  treatment, and prevention of vitamin D  deficiency: an Endocrine  Society clinical practice guideline, JCEM. 2011 Jul; 96(7): 1911-30.  Performed at [facility], [address] [place], [place] [address]   Vitamin B12     Status: None   Collection Time: 08/30/23  6:38 AM  Result Value Ref Range   Vitamin B-12 349 180 - 914 pg/mL    Comment: (NOTE) This assay is not validated for testing neonatal or myeloproliferative syndrome specimens for Vitamin B12 levels. Performed at [facility], [address] [place], [place] [address]     Blood Alcohol level:  Lab Results  Component Value Date   [facility] <10 08/28/2023   ETH <10 07/13/2023    Metabolic Disorder Labs: Lab Results  Component Value Date   HGBA1C 5.4 08/30/2023   MPG 108.28 08/30/2023   MPG 114.02 07/13/2023   Lab Results  Component Value Date   PROLACTIN 14.4 11/14/2022   PROLACTIN 8.8 01/01/2017   Lab Results  Component Value Date   CHOL 142 08/30/2023   TRIG 104 08/30/2023   HDL 35 (L) 08/30/2023   CHOLHDL 4.1 08/30/2023   VLDL 21 08/30/2023   LDLCALC 86 08/30/2023   LDLCALC 102 (H) 03/30/2023    Physical Findings: AIMS:  , ,  ,  ,    CIWA:  CIWA-Ar Total: 2 COWS:     Musculoskeletal: Strength & Muscle Tone: within normal limits Gait & Station: normal Patient leans: N/A  Psychiatric Specialty Exam:  Presentation  General Appearance:  Disheveled  Eye Contact: Fair  Speech: Clear and Coherent  Speech Volume: Decreased  Handedness: Right   Mood and  Affect  Mood: Depressed; Anxious  Affect: Congruent   Thought Process  Thought Processes: Coherent  Descriptions of Associations:Intact  Orientation:Full (Time, Place and Person)  Thought Content:Logical  History of Schizophrenia/Schizoaffective disorder:No  Duration of Psychotic Symptoms:No data recorded Hallucinations:Hallucinations: None Description of Auditory Hallucinations: a single female voice commanding to kill herself-last AH was 2 days ago  Ideas of Reference:None  Suicidal Thoughts:Suicidal Thoughts: Yes, Passive SI Passive Intent and/or Plan: Without Plan; Without Intent  Homicidal Thoughts:Homicidal Thoughts: No   Sensorium  Memory: Immediate Fair  Judgment: Fair  Insight: Fair   Chartered Certified Accountant: Fair  Attention Span: Fair  Recall: [employer] of Knowledge: Fair  Language: Fair  Psychomotor Activity  Psychomotor Activity: Psychomotor Activity: Normal  Assets  Assets: Resilience  Sleep  Sleep: Sleep: Fair  Physical Exam: Physical Exam Constitutional:      Appearance: Normal appearance.  Musculoskeletal:        General: Normal range of motion.     Cervical back: Normal range of motion.  Neurological:     General: No focal deficit present.     Mental Status: She is alert and oriented to person, place, and time.  Psychiatric:        Behavior: Behavior normal.    Review of Systems  Constitutional: Negative.   Psychiatric/Behavioral:  Positive for depression and substance abuse. Negative for hallucinations, memory loss and suicidal ideas. The patient is nervous/anxious and has insomnia.   All other systems reviewed and are negative.  Blood pressure  109/74, pulse 96, temperature 98.1 F (36.7 C), resp. rate 16, height 5' 7 (1.702 m), weight 115.7 kg, SpO2 95%, not currently breastfeeding. Body mass index is 39.94 kg/m.  Treatment Plan Summary: Daily contact with patient to assess and evaluate  symptoms and progress in treatment and Medication management   Safety and Monitoring: Voluntary admission to inpatient psychiatric unit for safety, stabilization and treatment Daily contact with patient to assess and evaluate symptoms and progress in treatment Patient's case to be discussed in multi-disciplinary team meeting Observation Level : q15 minute checks Vital signs: q12 hours Precautions: Safety   Long Term Goal(s): Improvement in symptoms so as ready for discharge   Short Term Goals: Ability to identify changes in lifestyle to reduce recurrence of condition will improve, Ability to verbalize feelings will improve, Ability to disclose and discuss suicidal ideas, Ability to demonstrate self-control will improve, Ability to identify and develop effective coping behaviors will improve, Ability to maintain clinical measurements within normal limits will improve, Compliance with prescribed medications will improve, and Ability to identify triggers associated with substance abuse/mental health issues will improve   Diagnoses Principal Problem:   Schizoaffective disorder, bipolar type ([name]) Active Problems:   GAD (generalized anxiety disorder)   Alcohol use disorder   Tobacco use   Medications -Continue Risperdal  1 mg BID for mood stabilization & psychosis -Increase Buspar  from 5 mg Q 8 H to 10 mg TID for GAD -Start Prozac  10 mg daily for depressive symptoms  -Start Vitamin D  50.000 units weekly for low Vit D level -Change Ativan  0-4mg  Q ^ H as per the sliding scale-See MAR -Continue Albuterol  inhaler Q 4 H PRN for wheezing/SOB -Continue Nicotine  transdermal patch of nicotine  dependence     PRNS -Continue Hydroxyzine  25 mg Q 6 H PRN for anxiety -Continue Trazodone  50 mg nightly PRN for sleep -Continue Nicorette  gum 2 mg PRN for nicotine  cravings -Continue Agitation Protocol medications as per the MAR: Zyprexa / PO/IM PRN -Continue Tylenol  650 mg every 6 hours PRN for mild  pain -Continue Maalox 30 mg every 4 hrs PRN for indigestion -Continue Milk of Magnesia as needed every 6 hrs for constipation   Labs Review: Low Potassium level has resolved and is currently WNL at 3.7. Vitamin D  low at 6.15, supplementing with 50.000 units weekly. Ordering STD panel as per pt's request due to recent unprotected sex.   Discharge Planning: Social work and case management to assist with discharge planning and identification of hospital follow-up needs prior to discharge Estimated LOS: 5-7 days Discharge Concerns: Need to establish a safety plan; Medication compliance and effectiveness Discharge Goals: Return home with outpatient referrals for mental health follow-up including medication management/psychotherapy   I certify that inpatient services furnished can reasonably be expected to improve the patient's condition.    Donia Snell, NP 08/30/2023, 2:23 PM

## 2023-08-30 NOTE — Progress Notes (Signed)
 PSA 2nd attempt  2nd attempt for PSA @ 2:40PM.  Pt was asleep, did not want to get up to complete assessment at this time. CSW will attempt again tomorrow.   Donevan Biller, LCSWA

## 2023-08-30 NOTE — Plan of Care (Signed)
  Problem: Education: Goal: Knowledge of [facility] Education information/materials will improve Outcome: Progressing Goal: Emotional status will improve Outcome: Progressing Goal: Mental status will improve Outcome: Progressing Goal: Verbalization of understanding the information provided will improve Outcome: Progressing   Problem: Activity: Goal: Interest or engagement in activities will improve Outcome: Progressing Goal: Sleeping patterns will improve Outcome: Progressing   Problem: Coping: Goal: Ability to verbalize frustrations and anger appropriately will improve Outcome: Progressing Goal: Ability to demonstrate self-control will improve Outcome: Progressing   Problem: Health Behavior/Discharge Planning: Goal: Identification of resources available to assist in meeting health care needs will improve Outcome: Progressing Goal: Compliance with treatment plan for underlying cause of condition will improve Outcome: Progressing   Problem: Physical Regulation: Goal: Ability to maintain clinical measurements within normal limits will improve Outcome: Progressing   Problem: Safety: Goal: Periods of time without injury will increase Outcome: Progressing   Problem: Education: Goal: Knowledge of  General Education information/materials will improve Outcome: Progressing Goal: Emotional status will improve Outcome: Progressing Goal: Mental status will improve Outcome: Progressing Goal: Verbalization of understanding the information provided will improve Outcome: Progressing   Problem: Activity: Goal: Interest or engagement in activities will improve Outcome: Progressing Goal: Sleeping patterns will improve Outcome: Progressing   Problem: Coping: Goal: Ability to verbalize frustrations and anger appropriately will improve Outcome: Progressing Goal: Ability to demonstrate self-control will improve Outcome: Progressing   Problem: Health  Behavior/Discharge Planning: Goal: Identification of resources available to assist in meeting health care needs will improve Outcome: Progressing Goal: Compliance with treatment plan for underlying cause of condition will improve Outcome: Progressing   Problem: Physical Regulation: Goal: Ability to maintain clinical measurements within normal limits will improve Outcome: Progressing   Problem: Safety: Goal: Periods of time without injury will increase Outcome: Progressing   Problem: Education: Goal: Utilization of techniques to improve thought processes will improve Outcome: Progressing Goal: Knowledge of the prescribed therapeutic regimen will improve Outcome: Progressing   Problem: Activity: Goal: Interest or engagement in leisure activities will improve Outcome: Progressing Goal: Imbalance in normal sleep/wake cycle will improve Outcome: Progressing   Problem: Coping: Goal: Coping ability will improve Outcome: Progressing Goal: Will verbalize feelings Outcome: Progressing   Problem: Health Behavior/Discharge Planning: Goal: Ability to make decisions will improve Outcome: Progressing Goal: Compliance with therapeutic regimen will improve Outcome: Progressing   Problem: Role Relationship: Goal: Will demonstrate positive changes in social behaviors and relationships Outcome: Progressing   Problem: Safety: Goal: Ability to disclose and discuss suicidal ideas will improve Outcome: Progressing Goal: Ability to identify and utilize support systems that promote safety will improve Outcome: Progressing   Problem: Self-Concept: Goal: Will verbalize positive feelings about self Outcome: Progressing Goal: Level of anxiety will decrease Outcome: Progressing   Problem: Education: Goal: Ability to make informed decisions regarding treatment will improve Outcome: Progressing   Problem: Coping: Goal: Coping ability will improve Outcome: Progressing   Problem: Health  Behavior/Discharge Planning: Goal: Identification of resources available to assist in meeting health care needs will improve Outcome: Progressing   Problem: Medication: Goal: Compliance with prescribed medication regimen will improve Outcome: Progressing   Problem: Self-Concept: Goal: Ability to disclose and discuss suicidal ideas will improve Outcome: Progressing Goal: Will verbalize positive feelings about self Outcome: Progressing Note: Patient is not on track and improving. Patient will work on increased adherence

## 2023-08-30 NOTE — Group Note (Signed)
 LCSW Group Therapy Note  Group Date: 08/30/2023 Start Time: 1100 End Time: 1200   Type of Therapy and Topic:  Group Therapy: Positive Affirmations/ vision board  Participation Level:  Did Not Attend   Description of Group:   This group addressed positive affirmation towards self and others.  Patients went around the room and identified two positive things about themselves and two positive things about a peer in the room.  Patients reflected on how it felt to share something positive with others, to identify positive things about themselves, and to hear positive things from others/ Patients were encouraged to have a daily reflection of positive characteristics or circumstances.   Therapeutic Goals: Patients will verbalize two of their positive qualities Patients will demonstrate empathy for others by stating two positive qualities about a peer in the group Patients will verbalize their feelings when voicing positive self affirmations and when voicing positive affirmations of others Patients will discuss the potential positive impact on their wellness/recovery of focusing on positive traits of self and others.  Summary of Patient Progress: Pt was invited, did not attend  Therapeutic Modalities:   Cognitive Behavioral Therapy Motivational Interviewing    Golda Louder, LCSWA 08/30/2023  12:27 PM

## 2023-08-30 NOTE — Plan of Care (Signed)
   Problem: Education: Goal: Emotional status will improve Outcome: Progressing Goal: Mental status will improve Outcome: Progressing   Problem: Activity: Goal: Interest or engagement in activities will improve Outcome: Progressing Goal: Sleeping patterns will improve Outcome: Progressing   Problem: Safety: Goal: Periods of time without injury will increase Outcome: Progressing

## 2023-08-30 NOTE — [facility] Group Notes (Signed)
 Adult Psychoeducational Group Note  Date:  08/30/2023 Time:  9:34 PM  Group Topic/Focus:  Wrap-Up Group:   The focus of this group is to help patients review their daily goal of treatment and discuss progress on daily workbooks.  Participation Level:  Active  Participation Quality:  Attentive  Affect:  Appropriate  Cognitive:  Alert  Insight: Appropriate  Engagement in Group:  Engaged  Modes of Intervention:  Discussion  Additional Comments:  Patient attended and participated in the Wrap-up group.  Melissa [name] 08/30/2023, 9:34 PM

## 2023-08-30 NOTE — Progress Notes (Signed)
   08/30/23 1500  Psych Admission Type (Psych Patients Only)  Admission Status Voluntary  Psychosocial Assessment  Patient Complaints Substance abuse  Eye Contact Brief  Facial Expression Flat  Affect Depressed  Speech Logical/coherent  Interaction Assertive  Motor Activity Slow  Appearance/Hygiene Disheveled  Behavior Characteristics Cooperative  Mood Depressed  Thought Process  Coherency WDL  Content WDL  Delusions None reported or observed  Perception WDL  Hallucination None reported or observed  Judgment Poor  Confusion None  Danger to Self  Current suicidal ideation? Denies  Danger to Others  Danger to Others None reported or observed

## 2023-08-31 DIAGNOSIS — F250 Schizoaffective disorder, bipolar type: Secondary | ICD-10-CM | POA: Diagnosis not present

## 2023-08-31 LAB — RPR: RPR Ser Ql: NONREACTIVE

## 2023-08-31 LAB — HIV ANTIBODY (ROUTINE TESTING W REFLEX): HIV Screen 4th Generation wRfx: NONREACTIVE

## 2023-08-31 MED ORDER — RISPERIDONE 0.5 MG PO TABS
0.5000 mg | ORAL_TABLET | Freq: Every morning | ORAL | Status: DC
  Administered 2023-09-01 – 2023-09-02 (×2): 0.5 mg via ORAL
  Filled 2023-08-31 (×4): qty 1

## 2023-08-31 MED ORDER — RISPERIDONE 2 MG PO TABS
2.0000 mg | ORAL_TABLET | Freq: Every day | ORAL | Status: DC
Start: 2023-09-01 — End: 2023-09-02
  Administered 2023-09-01: 2 mg via ORAL
  Filled 2023-08-31 (×4): qty 1

## 2023-08-31 MED ORDER — VALACYCLOVIR HCL 500 MG PO TABS
500.0000 mg | ORAL_TABLET | Freq: Two times a day (BID) | ORAL | Status: DC
  Administered 2023-08-31 – 2023-09-04 (×9): 500 mg via ORAL
  Filled 2023-08-31 (×15): qty 1

## 2023-08-31 MED ORDER — SERTRALINE HCL 50 MG PO TABS
50.0000 mg | ORAL_TABLET | Freq: Every day | ORAL | Status: DC
  Administered 2023-08-31 – 2023-09-04 (×5): 50 mg via ORAL
  Filled 2023-08-31 (×7): qty 1

## 2023-08-31 MED ORDER — SULFAMETHOXAZOLE-TRIMETHOPRIM 800-160 MG PO TABS
1.0000 | ORAL_TABLET | Freq: Two times a day (BID) | ORAL | Status: DC
  Administered 2023-08-31 – 2023-09-04 (×9): 1 via ORAL
  Filled 2023-08-31 (×11): qty 1

## 2023-08-31 MED ORDER — VALACYCLOVIR HCL 500 MG PO TABS
1000.0000 mg | ORAL_TABLET | Freq: Once | ORAL | Status: AC
  Administered 2023-08-31: 1000 mg via ORAL
  Filled 2023-08-31: qty 2

## 2023-08-31 NOTE — Progress Notes (Signed)
 I assumed care for Melissa [name] at about 08:00. She was resting in her bed,denied any new pain, denied any avh/hi/si,reports feeling tired and just wants to rest.She attended some groups, vital signs wnl, no behavioral problems this shift, declined her prazosin, MD made aware, med adjustments made by MD. She is being monitored as ordered.

## 2023-08-31 NOTE — Progress Notes (Signed)
     08/31/2023       10:51 AM   Melissa [name]   Type of Note: 3rd attempt on PSA  Attempted to complete assessment with pt this morning, pt was asleep and would not respond to CSW. PSA Summary will be completed.  Signed:  Arvada Seaborn, LCSW-A 08/31/2023  10:51 AM

## 2023-08-31 NOTE — Progress Notes (Signed)
 [facility] MD Progress Note  08/31/2023 12:47 PM Melissa [name]  MRN:  [id number]  HPI: Patient is a 23 yo African American female with prior mental health diagnoses of bipolar d/o, MDD & GAD who presented to the [facility] on 08/28/23 with complaints of SI with a plan to jump off a bridge in the context of non medication adherence, ETOH abuse & psychosocial stressors. Pt was transferred and admitted to this [facility] voluntarily on 08/27/2022 for treatment and stabilization of his mental status.   24 hr chart review: Pt has been compliant with meds, V/S are wNL with a slight elevation in her HR at 103. Required Hydroxyzine  as PRN last night for anxiety, required Trazodone  50 mg last night for insomnia. No behavioral episodes last night as per nursing documentation and reports. Slept for 7.25 hrs last night as per nursing documentation.  Patient assessment note: Pt is continuing to present with a flat affect and depressed mood, attention to personal hygiene and grooming remains poor, and the need to tend to personal hygiene and grooming reiterated. Eye contact is fair, speech is clear & coherent. Thought contents are organized and logical.  Pt however, is continuing to present with passive suicidal ideations today, denies having a plan or intent to harm herself, but is verbally contracting for safety on the unit. She denies HI/AVH or paranoia. There is no evidence of delusional thoughts.    Pt reports that the last time that she had AH was yesterday, and it consisted of a single female voice saying that she would be better off dead. Pt states that anxiety is slightly but depressive remains persistent & pt reports a low energy level along with poor concentration levels. She states that she was able to sleep last night, is making efforts to eat even though appetite remains poor.   We discussed changes to current medication regimen; Pt talked about Abilify  helping with her psychosis in the past, also shares that  Zoloft  was helpful. We made a decision yesterday to start Prozac  as pt denied manic type symptoms recently, and her presentation here on the unit have been depressed with no overt signs of mania. She refused the Prozac  earlier today morning, and is asking for Zoloft . We will dc Prozac  & start Zoloft  50 mg and continue to watch for mania/hypomania. We discussed keeping the Risperdal  and titrating upwards in an effort to completely eradicate her psychosis, and education was provided to substantiate the fact that Abilify  could not be added as this would be two antipsychotic, meds which are not really needed at this time. Pt verbalized understanding.  Pt. Shared that she is starting to break out in cold sores on her lips, and reveals a history of genital herpes, which is information that was not relayed on admission. She states that she typically takes Valtrex . We are ordering Valtrex  500 mg BID for treatment of her genital herpes. Continuing all other meds as listed below.  Labs reviewed: Pt''s urinalysis came back with large leukocytes & rare bacteria, & she is symptomatic reporting urgency. Will treat with Septra  DS BID x 5 days for UTI. Vit D very low at 6.15 ordering D 50.000 units weekly. STD related labs are pending.  Principal Problem: Schizoaffective disorder, bipolar type ([name]) Diagnosis: Principal Problem:   Schizoaffective disorder, bipolar type ([name]) Active Problems:   GAD (generalized anxiety disorder)   Alcohol use disorder   Tobacco use  Total Time spent with patient: 45 minutes  Past Psychiatric History: See H &  P  Past Medical History:  Past Medical History:  Diagnosis Date   Anemia    Anxiety    Asthma    inhaler. last attack June 2019   Complication of anesthesia    Congenital hydronephrosis 2001   Constipation    Depression    Episodic tension-type headache, not intractable 04/16/2015   Family history of adverse reaction to anesthesia    mom rushed to hospital from  Dentist office, pt her mother & grandmother have trouble waking fully after anesthesia   Genital herpes    Low back strain, sequela 11/10/2020   Migraine without aura and without status migrainosus, not intractable 04/16/2015   PID (pelvic inflammatory disease)    Post-partum depression 11/17/2021   Seizures ([name])    last one in 2015 - on meds   Sickle cell trait ([name])    TBI (traumatic brain injury) ([name]) 2006    Past Surgical History:  Procedure Laterality Date   APPENDECTOMY     CESAREAN SECTION N/A 08/31/2021   Procedure: CESAREAN SECTION;  Surgeon: Kandis Devaughn Sayres, MD;  Location: [facility] LD ORS;  Service: Obstetrics;  Laterality: N/A;   CESAREAN SECTION N/A 06/05/2022   Procedure: CESAREAN SECTION;  Surgeon: Lorence Ozell CROME, MD;  Location: [facility] LD ORS;  Service: Obstetrics;  Laterality: N/A;   HERNIA REPAIR     INTESTINAL MALROTATION REPAIR  2001   Family History:  Family History  Problem Relation Age of Onset   Depression Mother    Stroke Mother    Obesity Mother    Post-traumatic stress disorder Mother    Anxiety disorder Mother    Hypertension Mother    Diabetes Mother    Schizophrenia Father    Kidney disease Father    Family Psychiatric  History: As listed above Social History:  Social History   Substance and Sexual Activity  Alcohol Use Not Currently   Comment: occasionally, prior to pregnancy     Social History   Substance and Sexual Activity  Drug Use Not Currently   Types: Marijuana   Comment: last 2020    Social History   Socioeconomic History   Marital status: Single    Spouse name: Not on file   Number of children: Not on file   Years of education: Not on file   Highest education level: Not on file  Occupational History   Not on file  Tobacco Use   Smoking status: Every Day    Current packs/day: 0.25    Types: Cigarettes   Smokeless tobacco: Never   Tobacco comments:    black and milds, 2 cigs daily  Vaping Use   Vaping status: Former   Substance and Sexual Activity   Alcohol use: Not Currently    Comment: occasionally, prior to pregnancy   Drug use: Not Currently    Types: Marijuana    Comment: last 2020   Sexual activity: Not Currently    Partners: Male    Birth control/protection: None    Comment: missed shot for a few months, planning to go get shots in december  Other Topics Concern   Not on file  Social History Narrative   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   She attended [employer].    She lives with her parents, siblings, and grandfather.    She enjoys writing, singing, dancing, cooking, and shopping.   She attends [employer].   Social Drivers of Corporate Investment Banker Strain: Not on file  Food Insecurity: No Food Insecurity (08/28/2023)   Hunger Vital Sign    Worried About Running Out of Food in the Last Year: Never true    Ran Out of Food in the Last Year: Never true  Transportation Needs: No Transportation Needs (08/28/2023)   PRAPARE - Administrator, Civil Service (Medical): No    Lack of Transportation (Non-Medical): No  Physical Activity: Not on file  Stress: Stress Concern Present (08/08/2022)   Received from [facility], [facility]   [facility] - Occupational Stress Questionnaire    Feeling of Stress : To some extent  Social Connections: Moderately Integrated (08/28/2023)   Social Connection and Isolation Panel [NHANES]    Frequency of Communication with Friends and Family: More than three times a week    Frequency of Social Gatherings with Friends and Family: More than three times a week    Attends Religious Services: 1 to 4 times per year    Active Member of [employer] or Organizations: No    Attends Engineer, Structural: Never    Marital Status: Married   Sleep: Fair  Appetite:  Fair  Current Medications: Current Facility-Administered Medications  Medication Dose Route Frequency Provider Last Rate Last Admin    acetaminophen  (TYLENOL ) tablet 650 mg  650 mg Oral Q6H PRN Mills, Shnese E, NP   650 mg at 08/29/23 0630   albuterol  (VENTOLIN  HFA) 108 (90 Base) MCG/ACT inhaler 1-2 puff  1-2 puff Inhalation Q4H PRN Massengill, Rankin, MD       alum & mag hydroxide-simeth (MAALOX/MYLANTA) 200-200-20 MG/5ML suspension 30 mL  30 mL Oral Q4H PRN Mills, Shnese E, NP       busPIRone  (BUSPAR ) tablet 10 mg  10 mg Oral TID Taylee Gunnells, NP   10 mg at 08/31/23 9188   FLUoxetine  (PROZAC ) capsule 10 mg  10 mg Oral Daily Tulsi Crossett, NP       hydrOXYzine  (ATARAX ) tablet 25 mg  25 mg Oral Q6H PRN Massengill, Rankin, MD       hydrOXYzine  (ATARAX ) tablet 25 mg  25 mg Oral TID PRN Johny Rankin, MD   25 mg at 08/30/23 2118   loperamide  (IMODIUM ) capsule 2-4 mg  2-4 mg Oral PRN Massengill, Rankin, MD       LORazepam  (ATIVAN ) tablet 0-4 mg  0-4 mg Oral Q12H Mills, Shnese E, NP       magnesium  hydroxide (MILK OF MAGNESIA) suspension 30 mL  30 mL Oral Daily PRN Mills, Shnese E, NP       multivitamin with minerals tablet 1 tablet  1 tablet Oral Daily Massengill, Nathan, MD   1 tablet at 08/31/23 9188   nicotine  (NICODERM CQ  - dosed in mg/24 hours) patch 21 mg  21 mg Transdermal Q0600 Mills, Shnese E, NP   21 mg at 08/30/23 9044   nicotine  polacrilex (NICORETTE ) gum 2 mg  2 mg Oral PRN Massengill, Rankin, MD       OLANZapine  (ZYPREXA ) injection 10 mg  10 mg Intramuscular TID PRN Massengill, Rankin, MD       OLANZapine  (ZYPREXA ) injection 5 mg  5 mg Intramuscular TID PRN Massengill, Rankin, MD       OLANZapine  zydis (ZYPREXA ) disintegrating tablet 5 mg  5 mg Oral TID PRN Massengill, Rankin, MD       ondansetron  (ZOFRAN -ODT) disintegrating tablet 4 mg  4 mg Oral Q6H PRN Johny Rankin, MD   4 mg at 08/30/23 1008   [  START ON 09/01/2023] risperiDONE  (RISPERDAL ) tablet 0.5 mg  0.5 mg Oral q AM Lovel Suazo, NP       [START ON 09/01/2023] risperiDONE  (RISPERDAL ) tablet 2 mg  2 mg Oral QHS Leotha Westermeyer, NP       sertraline   (ZOLOFT ) tablet 50 mg  50 mg Oral Daily Wilder Amodei, NP       sulfamethoxazole -trimethoprim  (BACTRIM  DS) 800-160 MG per tablet 1 tablet  1 tablet Oral Q12H Marlisha Vanwyk, NP       thiamine  (Vitamin B-1) tablet 100 mg  100 mg Oral Daily Mills, Shnese E, NP   100 mg at 08/31/23 0811   traZODone  (DESYREL ) tablet 50 mg  50 mg Oral QHS PRN Johny Lot, MD   50 mg at 08/30/23 2118   valACYclovir  (VALTREX ) tablet 1,000 mg  1,000 mg Oral Once Yoshiye Kraft, NP       valACYclovir  (VALTREX ) tablet 500 mg  500 mg Oral BID Tex Drilling, NP       Vitamin D  (Ergocalciferol ) (DRISDOL ) 1.25 MG (50000 UNIT) capsule 50,000 Units  50,000 Units Oral Q7 days Tex Drilling, NP   50,000 Units at 08/30/23 1700    Lab Results:  Results for orders placed or performed during the hospital encounter of 08/28/23 (from the past 48 hours)  Basic metabolic panel     Status: Abnormal   Collection Time: 08/30/23  6:38 AM  Result Value Ref Range   Sodium 135 135 - 145 mmol/L   Potassium 3.7 3.5 - 5.1 mmol/L   Chloride 105 98 - 111 mmol/L   CO2 21 (L) 22 - 32 mmol/L   Glucose, Bld 104 (H) 70 - 99 mg/dL    Comment: Glucose reference range applies only to samples taken after fasting for at least 8 hours.   BUN 13 6 - 20 mg/dL   Creatinine, Ser 9.23 0.44 - 1.00 mg/dL   Calcium  9.1 8.9 - 10.3 mg/dL   GFR, Estimated >39 >39 mL/min    Comment: (NOTE) Calculated using the CKD-EPI Creatinine Equation (2021)    Anion gap 9 5 - 15    Comment: Performed at [facility], [address], [place], [place] [address]  TSH     Status: None   Collection Time: 08/30/23  6:38 AM  Result Value Ref Range   TSH 1.251 0.350 - 4.500 uIU/mL    Comment: Performed by a 3rd Generation assay with a functional sensitivity of <=0.01 uIU/mL. Performed at [facility], [address], [place], [place] [address]   Lipid panel     Status: Abnormal   Collection Time: 08/30/23  6:38 AM  Result Value  Ref Range   Cholesterol 142 0 - 200 mg/dL   Triglycerides 895 <849 mg/dL   HDL 35 (L) >59 mg/dL   Total CHOL/HDL Ratio 4.1 RATIO   VLDL 21 0 - 40 mg/dL   LDL Cholesterol 86 0 - 99 mg/dL    Comment:        Total Cholesterol/HDL:CHD Risk Coronary Heart Disease Risk Table                     Men   Women  1/2 Average Risk   3.4   3.3  Average Risk       5.0   4.4  2 X Average Risk   9.6   7.1  3 X Average Risk  23.4   11.0  Use the calculated Patient Ratio above and the CHD Risk Table to determine the patient's CHD Risk.        ATP III CLASSIFICATION (LDL):  <100     mg/dL   Optimal  899-870  mg/dL   Near or Above                    Optimal  130-159  mg/dL   Borderline  839-810  mg/dL   High  >809     mg/dL   Very High Performed at [facility], [address] [place], [place] [address]   Hemoglobin A1c     Status: None   Collection Time: 08/30/23  6:38 AM  Result Value Ref Range   Hgb A1c MFr Bld 5.4 4.8 - 5.6 %    Comment: (NOTE) Pre diabetes:          5.7%-6.4%  Diabetes:              >6.4%  Glycemic control for   <7.0% adults with diabetes    Mean Plasma Glucose 108.28 mg/dL    Comment: Performed at [facility], [address] [place], [place] [address]  VITAMIN D  25 Hydroxy (Vit-D Deficiency, Fractures)     Status: Abnormal   Collection Time: 08/30/23  6:38 AM  Result Value Ref Range   Vit D, 25-Hydroxy 6.15 (L) 30 - 100 ng/mL    Comment: (NOTE) Vitamin D  deficiency has been defined by the Institute of Medicine  and an Endocrine Society practice guideline as a level of serum 25-OH  vitamin D  less than 20 ng/mL (1,2). The Endocrine Society went on to  further define vitamin D  insufficiency as a level between 21 and 29  ng/mL (2).  1. IOM (Institute of Medicine). 2010. Dietary reference intakes for  calcium  and D. [place]: [employer]. 2. Holick MF, Binkley Salado, Bischoff-Ferrari HA, et al. Evaluation,   treatment, and prevention of vitamin D  deficiency: an Endocrine  Society clinical practice guideline, JCEM. 2011 Jul; 96(7): 1911-30.  Performed at [facility], [address] [place], [place] [address]   Vitamin B12     Status: None   Collection Time: 08/30/23  6:38 AM  Result Value Ref Range   Vitamin B-12 349 180 - 914 pg/mL    Comment: (NOTE) This assay is not validated for testing neonatal or myeloproliferative syndrome specimens for Vitamin B12 levels. Performed at [facility], [address] [place], [place] [address]   Urinalysis, Routine w reflex microscopic -Urine, Random     Status: Abnormal   Collection Time: 08/30/23  5:10 PM  Result Value Ref Range   Color, Urine YELLOW YELLOW   APPearance HAZY (A) CLEAR   Specific Gravity, Urine 1.015 1.005 - 1.030   pH 7.0 5.0 - 8.0   Glucose, UA NEGATIVE NEGATIVE mg/dL   Hgb urine dipstick NEGATIVE NEGATIVE   Bilirubin Urine NEGATIVE NEGATIVE   Ketones, ur NEGATIVE NEGATIVE mg/dL   Protein, ur NEGATIVE NEGATIVE mg/dL   Nitrite NEGATIVE NEGATIVE   Leukocytes,Ua LARGE (A) NEGATIVE   RBC / HPF 11-20 0 - 5 RBC/hpf   WBC, UA 21-50 0 - 5 WBC/hpf   Bacteria, UA RARE (A) NONE SEEN   Squamous Epithelial / HPF 11-20 0 - 5 /HPF   Mucus PRESENT     Comment: Performed at [facility], [address], [place], [place] [address]  Blood Alcohol level:  Lab Results  Component Value Date   ETH <10 08/28/2023   ETH <10 07/13/2023    Metabolic Disorder Labs: Lab Results  Component Value Date   HGBA1C 5.4 08/30/2023   MPG 108.28 08/30/2023   MPG 114.02 07/13/2023   Lab Results  Component Value Date   PROLACTIN 14.4 11/14/2022   PROLACTIN 8.8 01/01/2017   Lab Results  Component Value Date   CHOL 142 08/30/2023   TRIG 104 08/30/2023   HDL 35 (L) 08/30/2023   CHOLHDL 4.1 08/30/2023   VLDL 21 08/30/2023   LDLCALC 86 08/30/2023   LDLCALC 102 (H) 03/30/2023    Physical Findings: AIMS:   , ,  ,  ,    CIWA:  CIWA-Ar Total: 1 COWS:     Musculoskeletal: Strength & Muscle Tone: within normal limits Gait & Station: normal Patient leans: N/A  Psychiatric Specialty Exam:  Presentation  General Appearance:  Disheveled  Eye Contact: Fair  Speech: Clear and Coherent  Speech Volume: Normal  Handedness: Right   Mood and Affect  Mood: Depressed; Anxious  Affect: Congruent   Thought Process  Thought Processes: Coherent  Descriptions of Associations:Intact  Orientation:Full (Time, Place and Person)  Thought Content:Logical  History of Schizophrenia/Schizoaffective disorder:No  Duration of Psychotic Symptoms:No data recorded Hallucinations:Hallucinations: Auditory (last AH was yesterday, with voice telling her she should not be alive.)  Ideas of Reference:None  Suicidal Thoughts:Suicidal Thoughts: Yes, Passive SI Passive Intent and/or Plan: Without Intent; Without Plan  Homicidal Thoughts:Homicidal Thoughts: No   Sensorium  Memory: Immediate Fair; Recent Fair  Judgment: Fair  Insight: Fair   Chartered Certified Accountant: Fair  Attention Span: Fair  Recall: [employer] of Knowledge: Fair  Language: Fair  Psychomotor Activity  Psychomotor Activity: Psychomotor Activity: Normal  Assets  Assets: Resilience  Sleep  Sleep: Sleep: Good  Physical Exam: Physical Exam Constitutional:      Appearance: Normal appearance.  Musculoskeletal:        General: Normal range of motion.     Cervical back: Normal range of motion.  Neurological:     General: No focal deficit present.     Mental Status: She is alert and oriented to person, place, and time.  Psychiatric:        Behavior: Behavior normal.    Review of Systems  Constitutional: Negative.   Psychiatric/Behavioral:  Positive for depression and substance abuse. Negative for hallucinations, memory loss and suicidal ideas. The patient is nervous/anxious and has  insomnia.   All other systems reviewed and are negative.  Blood pressure 112/75, pulse 98, temperature 98.3 F (36.8 C), temperature source Oral, resp. rate 16, height 5' 7 (1.702 m), weight 115.7 kg, SpO2 100%, not currently breastfeeding. Body mass index is 39.94 kg/m.  Treatment Plan Summary: Daily contact with patient to assess and evaluate symptoms and progress in treatment and Medication management   Safety and Monitoring: Voluntary admission to inpatient psychiatric unit for safety, stabilization and treatment Daily contact with patient to assess and evaluate symptoms and progress in treatment Patient's case to be discussed in multi-disciplinary team meeting Observation Level : q15 minute checks Vital signs: q12 hours Precautions: Safety   Long Term Goal(s): Improvement in symptoms so as ready for discharge   Short Term Goals: Ability to identify changes in lifestyle to reduce recurrence of condition will improve, Ability to verbalize feelings will improve, Ability to disclose and discuss suicidal ideas, Ability to demonstrate self-control will improve, Ability to identify  and develop effective coping behaviors will improve, Ability to maintain clinical measurements within normal limits will improve, Compliance with prescribed medications will improve, and Ability to identify triggers associated with substance abuse/mental health issues will improve   Diagnoses Principal Problem:   Schizoaffective disorder, bipolar type ([name]) Active Problems:   GAD (generalized anxiety disorder)   Alcohol use disorder   Tobacco use   Medications -Start Risperdal  0.5 mg in the mornings for psychosis & mood stabilization -Increase HS Risperdal  to 2. Mg nightly for mood stabilization & psychosis -Continue Buspar  10 mg TID for GAD -Discontinue Prozac  10 mg daily per pt's request -Continue Vitamin D  50.000 units weekly for low Vit D level -Continue Ativan  0-4mg  Q 6 H as per the sliding  scale-See MAR -Continue Albuterol  inhaler Q 4 H PRN for wheezing/SOB -Continue Nicotine  transdermal patch of nicotine  dependence -Give Valtrex  1000 mg x 1 dose for herpes simplex virus -Start Valtrex  500 mg BID for genital herpes starting today at 2000.     PRNS -Continue Hydroxyzine  25 mg Q 6 H PRN for anxiety -Continue Trazodone  50 mg nightly PRN for sleep -Continue Nicorette  gum 2 mg PRN for nicotine  cravings -Continue Agitation Protocol medications as per the MAR: Zyprexa / PO/IM PRN -Continue Tylenol  650 mg every 6 hours PRN for mild pain -Continue Maalox 30 mg every 4 hrs PRN for indigestion -Continue Milk of Magnesia as needed every 6 hrs for constipation   Labs Review: No new orders placed today    Discharge Planning: Social work and case management to assist with discharge planning and identification of hospital follow-up needs prior to discharge Estimated LOS: 5-7 days Discharge Concerns: Need to establish a safety plan; Medication compliance and effectiveness Discharge Goals: Return home with outpatient referrals for mental health follow-up including medication management/psychotherapy   I certify that inpatient services furnished can reasonably be expected to improve the patient's condition.    Donia Snell, NP 08/31/2023, 12:47 PM Patient ID: Melissa [name], female   DOB: September 28, 2000, 23 y.o.   MRN: [id number]

## 2023-08-31 NOTE — [facility] Group Notes (Signed)

## 2023-08-31 NOTE — Progress Notes (Signed)
   08/30/23 2100  Psych Admission Type (Psych Patients Only)  Admission Status Voluntary  Psychosocial Assessment  Patient Complaints Anxiety;Depression;Substance abuse  Eye Contact Brief  Facial Expression Flat  Affect Depressed  Speech Logical/coherent  Interaction Assertive  Motor Activity Slow  Appearance/Hygiene Disheveled  Behavior Characteristics Cooperative  Mood Depressed  Thought Process  Coherency WDL  Content WDL  Delusions None reported or observed  Perception WDL  Hallucination None reported or observed  Judgment Poor  Confusion None  Danger to Self  Current suicidal ideation? Denies  Self-Injurious Behavior No self-injurious ideation or behavior indicators observed or expressed   Agreement Not to Harm Self Yes  Description of Agreement Verbal  Danger to Others  Danger to Others None reported or observed

## 2023-08-31 NOTE — Progress Notes (Signed)
   08/31/23 2115  Psych Admission Type (Psych Patients Only)  Admission Status Voluntary  Psychosocial Assessment  Patient Complaints Anxiety  Eye Contact Avoids  Facial Expression Flat  Affect Depressed  Speech Logical/coherent  Interaction Avoidant  Motor Activity Slow  Appearance/Hygiene Disheveled  Behavior Characteristics Cooperative  Mood Anxious  Thought Process  Coherency WDL  Content WDL  Delusions None reported or observed  Perception WDL  Hallucination None reported or observed  Judgment Poor  Confusion None  Danger to Self  Current suicidal ideation? Denies  Self-Injurious Behavior No self-injurious ideation or behavior indicators observed or expressed   Agreement Not to Harm Self Yes  Description of Agreement Verbal

## 2023-08-31 NOTE — Plan of Care (Signed)
   Problem: Education: Goal: Knowledge of [place] [facility] Education information/materials will improve Outcome: Progressing

## 2023-08-31 NOTE — [facility] Counselor (Addendum)
 Adult Comprehensive Assessment  Patient ID: Melissa [name], female   DOB: [birth year], 23 y.o.   MRN: [id number]  Information Source: Chart Review  CSW attempted PSA 3 times: 1st attempt 1/8 @1127 , 2nd attempt - 1/9 at 2:40 PM 3rd attempt 1/10 @ 11AM   Summary/Recommendations:  Melissa [name] is a 23 year old female who is voluntarily admitted to [facility] due to suicidal ideation with a plan to jump off a bridge and worsening depression according to RN note from [facility] ED. Pt also reports drinking 1/2 pint of alcohol daily for the last 2 years according to pt's H&P and not experiencing withdrawals due to continuous drinking daily. Pt has 3 children who are in her mothers custody and she did not go back to [employer] to be closer with her children per nursing note. Pt has had a prior suicide attempt by overdosing on medications. Unsure of current therapist and outpatient psychiatrist if patient is established someone. While here, [name] can benefit from crisis stabilization, medication management, therapeutic milieu, and referrals for services.   Pending consents for SPE and therapy/medication management ROI.     Jenkins LULLA Primer. 08/31/2023

## 2023-08-31 NOTE — Group Note (Signed)
 Recreation Therapy Group Note   Group Topic:Health and Wellness  Group Date: 08/31/2023 Start Time: 0930 End Time: 1007 Facilitators: Kenna Seward-McCall, LRT,CTRS Location: [address] Dayroom   Group Topic: Wellness  Goal Area(s) Addresses:  Patient will define components of whole wellness. Patient will verbalize benefit of whole wellness.  Group Description: LRT and patients discussed the importance of exercise and its affect on your body and lifestyle. Patients took turns leading the group in the exercises of their choosing. Patients also chose the music they wanted to exercise to as well.    Education: Wellness, Building Control Surveyor.   Education Outcome: Acknowledges education/In group clarification offered/Needs additional education.    Affect/Mood: N/A   Participation Level: Did not attend    Clinical Observations/Individualized Feedback:     Plan: Continue to engage patient in RT group sessions 2-3x/week.   Patricia Fargo-McCall, LRT,CTRS 08/31/2023 11:41 AM

## 2023-09-01 DIAGNOSIS — F250 Schizoaffective disorder, bipolar type: Secondary | ICD-10-CM | POA: Diagnosis not present

## 2023-09-01 NOTE — Progress Notes (Addendum)
 [facility] MD Progress Note  09/01/2023 2:14 PM Melissa [name]  MRN:  [id number]  HPI: Per chart review: Patient is a 23 yo African American female with prior mental health diagnoses of bipolar d/o, MDD & GAD who presented to the [facility] on 08/28/23 with complaints of SI with a plan to jump off a bridge in the context of non medication adherence, ETOH abuse & psychosocial stressors. Pt was transferred and admitted to this [facility] voluntarily on 08/27/2022 for treatment and stabilization of his mental status.   24 hr chart review:  The patient's chart and nursing notes were reviewed. The patient's case was discussed in multidisciplinary team meeting. Vital signs within defined limits. Patient is compliant with routine medication regimen without difficulty. Sleep hours last night: 10.25 hours, as documented in the nursing flow sheets. No behavioral episodes or nursing concerns were reported at this time. No PRN medication required, according to the nursing record.    Information Obtained Today during Interview: The patient was seen face-to-face by this provider. Chart was reviewed on 09/01/23 and case staffed with attending psychiatrist, A. Pashayan.  Upon approach, the patient was observed lying in bed asleep.  Upon awakening, she appeared drowsy but reported that her mood was better than when I first came here.  She rated her depression as 5/10 and anxiety as 4/10, with 10 being the most severe.  The patient stated that she is no longer experiencing intrusive thoughts and acknowledged feeling somewhat drowsy. However, she expressed a desire to give the new medication adjustment another day to assess its effectiveness. She reported taken as needed hydroxyzine  last night in addition to her risperidone , which was adjusted yesterday.  She was educated on the potential for risperidone  to help with her anxiety and advised that she may not need as needed hydroxyzine  when taking risperidone .  The patient  reported that she showered yesterday. She denies any active suicidal or homicidal ideation. She also reported the voices are gone and denied auditory or visual hallucinations. Her appetite is satisfactory, and she stated her focus is good. She complained of a stuffy nose, which she attributed either to the room being too warm or to the medication.  She noted that the congestion started this morning and is intermittent.  She denied any dysuria or hematuria but reported increased urinary frequency, which is consistent with her current treatment for UTI with antibiotics. She denied experiencing any adverse side effects related to her medications.  The patient stated that she attended groups on the unit, including the wrap-up group last night, where they discussed goals for the current upcoming day. She reported although she initially felt fatigue when admitted, she expressed plans to participate in more groups throughout the day. The patient shared that today is her daughter's second birthday and expressed a desire to video chat with her. This information will be passed along to the nursing staff to facilitate a video chat with her daughter if feasible.   Medication adjustments made yesterday 08/31/23: -Start Risperdal  0.5 mg in the mornings for psychosis & mood stabilization -Increase HS Risperdal  to 2. Mg nightly for mood stabilization & psychosis -Discontinue Prozac  10 mg daily per patient's request -Give Valtrex  1000 mg x 1 dose for herpes simplex virus -Start Valtrex  500 mg BID for genital herpes starting today at 2000.   Labs reviewed: STD related labs are pending.  Principal Problem: Schizoaffective disorder, bipolar type ([name]) Diagnosis: Principal Problem:   Schizoaffective disorder, bipolar type ([name]) Active Problems:  GAD (generalized anxiety disorder)   Alcohol use disorder   Tobacco use  Total Time spent with patient: 45 minutes  Past Psychiatric History: See H & P  Past Medical  History:  Past Medical History:  Diagnosis Date   Anemia    Anxiety    Asthma    inhaler. 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   Seizures ([name])    last one in 2015 - on meds   Sickle cell trait ([name])    TBI (traumatic brain injury) ([name]) 2006    Past Surgical History:  Procedure Laterality Date   APPENDECTOMY     CESAREAN SECTION N/A 08/31/2021   Procedure: CESAREAN SECTION;  Surgeon: Kandis Devaughn Sayres, MD;  Location: [facility] LD ORS;  Service: Obstetrics;  Laterality: N/A;   CESAREAN SECTION N/A 06/05/2022   Procedure: CESAREAN SECTION;  Surgeon: Lorence Ozell CROME, MD;  Location: [facility] LD ORS;  Service: Obstetrics;  Laterality: N/A;   HERNIA REPAIR     INTESTINAL MALROTATION REPAIR  2001   Family History:  Family History  Problem Relation Age of Onset   Depression Mother    Stroke Mother    Obesity Mother    Post-traumatic stress disorder Mother    Anxiety disorder Mother    Hypertension Mother    Diabetes Mother    Schizophrenia Father    Kidney disease Father    Family Psychiatric  History: As listed above Social History:  Social History   Substance and Sexual Activity  Alcohol Use Not Currently   Comment: occasionally, prior to pregnancy     Social History   Substance and Sexual Activity  Drug Use Not Currently   Types: Marijuana   Comment: last 2020    Social History   Socioeconomic History   Marital status: Single    Spouse name: Not on file    Number of children: Not on file   Years of education: Not on file   Highest education level: Not on file  Occupational History   Not on file  Tobacco Use   Smoking status: Every Day    Current packs/day: 0.25    Types: Cigarettes   Smokeless tobacco: Never   Tobacco comments:    black and milds, 2 cigs daily  Vaping Use   Vaping status: Former  Substance and Sexual Activity   Alcohol use: Not Currently    Comment: occasionally, prior to pregnancy   Drug use: Not Currently    Types: Marijuana    Comment: last 2020   Sexual activity: Not Currently    Partners: Male    Birth control/protection: None    Comment: missed shot for a few months, planning to go get shots in december  Other Topics Concern   Not on file  Social History Narrative   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   She attended [employer].    She lives with her parents, siblings, and grandfather.  She enjoys writing, singing, dancing, cooking, and shopping.   She attends [employer].   Social Drivers of Corporate Investment Banker Strain: Not on file  Food Insecurity: No Food Insecurity (08/28/2023)   Hunger Vital Sign    Worried About Running Out of Food in the Last Year: Never true    Ran Out of Food in the Last Year: Never true  Transportation Needs: No Transportation Needs (08/28/2023)   PRAPARE - Administrator, Civil Service (Medical): No    Lack of Transportation (Non-Medical): No  Physical Activity: Not on file  Stress: Stress Concern Present (08/08/2022)   Received from [facility]   [facility] of Occupational Health - Occupational Stress Questionnaire    Feeling of Stress : To some extent  Social Connections: Moderately Integrated (08/28/2023)   Social Connection and Isolation Panel [NHANES]    Frequency of Communication with Friends and Family: More than three times a week    Frequency of Social Gatherings with Friends and Family: More than three  times a week    Attends Religious Services: 1 to 4 times per year    Active Member of [employer] or Organizations: No    Attends Engineer, Structural: Never    Marital Status: Married   Sleep: Fair  Appetite:  Fair  Current Medications: Current Facility-Administered Medications  Medication Dose Route Frequency Provider Last Rate Last Admin   acetaminophen  (TYLENOL ) tablet 650 mg  650 mg Oral Q6H PRN Mills, Shnese E, NP   650 mg at 08/29/23 0630   albuterol  (VENTOLIN  HFA) 108 (90 Base) MCG/ACT inhaler 1-2 puff  1-2 puff Inhalation Q4H PRN Massengill, Rankin, MD       alum & mag hydroxide-simeth (MAALOX/MYLANTA) 200-200-20 MG/5ML suspension 30 mL  30 mL Oral Q4H PRN Mills, Shnese E, NP       busPIRone  (BUSPAR ) tablet 10 mg  10 mg Oral TID Nkwenti, Doris, NP   10 mg at 09/01/23 1236   FLUoxetine  (PROZAC ) capsule 10 mg  10 mg Oral Daily Nkwenti, Doris, NP       hydrOXYzine  (ATARAX ) tablet 25 mg  25 mg Oral TID PRN Johny Rankin, MD   25 mg at 08/30/23 2118   magnesium  hydroxide (MILK OF MAGNESIA) suspension 30 mL  30 mL Oral Daily PRN Mills, Shnese E, NP       multivitamin with minerals tablet 1 tablet  1 tablet Oral Daily Massengill, Nathan, MD   1 tablet at 09/01/23 0932   nicotine  (NICODERM CQ  - dosed in mg/24 hours) patch 21 mg  21 mg Transdermal Q0600 Mills, Shnese E, NP   21 mg at 08/30/23 9044   nicotine  polacrilex (NICORETTE ) gum 2 mg  2 mg Oral PRN Massengill, Rankin, MD       OLANZapine  (ZYPREXA ) injection 10 mg  10 mg Intramuscular TID PRN Massengill, Rankin, MD       OLANZapine  (ZYPREXA ) injection 5 mg  5 mg Intramuscular TID PRN Massengill, Rankin, MD       OLANZapine  zydis (ZYPREXA ) disintegrating tablet 5 mg  5 mg Oral TID PRN Massengill, Rankin, MD       risperiDONE  (RISPERDAL ) tablet 0.5 mg  0.5 mg Oral q AM Nkwenti, Doris, NP   0.5 mg at 09/01/23 9066   risperiDONE  (RISPERDAL ) tablet 2 mg  2 mg Oral QHS Nkwenti, Doris, NP       sertraline  (ZOLOFT ) tablet 50 mg  50  mg Oral Daily Nkwenti,  Doris, NP   50 mg at 09/01/23 0932   sulfamethoxazole -trimethoprim  (BACTRIM  DS) 800-160 MG per tablet 1 tablet  1 tablet Oral Q12H Tex Drilling, NP   1 tablet at 09/01/23 0932   thiamine  (Vitamin B-1) tablet 100 mg  100 mg Oral Daily Mills, Shnese E, NP   100 mg at 09/01/23 9066   traZODone  (DESYREL ) tablet 50 mg  50 mg Oral QHS PRN Johny Lot, MD   50 mg at 08/30/23 2118   valACYclovir  (VALTREX ) tablet 500 mg  500 mg Oral BID Tex Drilling, NP   500 mg at 09/01/23 9063   Vitamin D  (Ergocalciferol ) (DRISDOL ) 1.25 MG (50000 UNIT) capsule 50,000 Units  50,000 Units Oral Q7 days Tex Drilling, NP   50,000 Units at 08/30/23 1700    Lab Results:  Results for orders placed or performed during the hospital encounter of 08/28/23 (from the past 48 hours)  Urinalysis, Routine w reflex microscopic -Urine, Random     Status: Abnormal   Collection Time: 08/30/23  5:10 PM  Result Value Ref Range   Color, Urine YELLOW YELLOW   APPearance HAZY (A) CLEAR   Specific Gravity, Urine 1.015 1.005 - 1.030   pH 7.0 5.0 - 8.0   Glucose, UA NEGATIVE NEGATIVE mg/dL   Hgb urine dipstick NEGATIVE NEGATIVE   Bilirubin Urine NEGATIVE NEGATIVE   Ketones, ur NEGATIVE NEGATIVE mg/dL   Protein, ur NEGATIVE NEGATIVE mg/dL   Nitrite NEGATIVE NEGATIVE   Leukocytes,Ua LARGE (A) NEGATIVE   RBC / HPF 11-20 0 - 5 RBC/hpf   WBC, UA 21-50 0 - 5 WBC/hpf   Bacteria, UA RARE (A) NONE SEEN   Squamous Epithelial / HPF 11-20 0 - 5 /HPF   Mucus PRESENT     Comment: Performed at [facility], [address], [place], [place] [address]  RPR     Status: None   Collection Time: 08/31/23  7:22 AM  Result Value Ref Range   RPR Ser Ql NON REACTIVE NON REACTIVE    Comment: Performed at [facility], [address] [place], [place] [address]  HIV Antibody (routine testing w rflx)     Status: None   Collection Time: 08/31/23  7:22 AM  Result Value Ref Range   HIV Screen 4th  Generation wRfx Non Reactive Non Reactive    Comment: Performed at [facility], [address] [place], [place] [address]    Blood Alcohol level:  Lab Results  Component Value Date   [facility] <10 08/28/2023   ETH <10 07/13/2023    Metabolic Disorder Labs: Lab Results  Component Value Date   HGBA1C 5.4 08/30/2023   MPG 108.28 08/30/2023   MPG 114.02 07/13/2023   Lab Results  Component Value Date   PROLACTIN 14.4 11/14/2022   PROLACTIN 8.8 01/01/2017   Lab Results  Component Value Date   CHOL 142 08/30/2023   TRIG 104 08/30/2023   HDL 35 (L) 08/30/2023   CHOLHDL 4.1 08/30/2023   VLDL 21 08/30/2023   LDLCALC 86 08/30/2023   LDLCALC 102 (H) 03/30/2023    Physical Findings: AIMS:  , ,  ,  ,    CIWA:  CIWA-Ar Total: 1 COWS:     Musculoskeletal: Strength & Muscle Tone: within normal limits Gait & Station: normal Patient leans: N/A  Psychiatric Specialty Exam:  Presentation  General Appearance:  Appropriate for Environment; Fairly Groomed  Eye Contact: Good  Speech: Clear and Coherent  Speech Volume: Normal  Handedness: Right   Mood and Affect  Mood: Euthymic  Affect: Congruent   Thought Process  Thought Processes: Coherent  Descriptions of Associations:Intact  Orientation:Full (Time, Place and Person)  Thought Content:Logical  History of Schizophrenia/Schizoaffective disorder:No  Duration of Psychotic Symptoms:No data recorded Hallucinations:Hallucinations: None (Denies)  Ideas of Reference:None  Suicidal Thoughts:Suicidal Thoughts: No (Denies) SI Passive Intent and/or Plan: Without Intent; Without Plan  Homicidal Thoughts:Homicidal Thoughts: No   Sensorium  Memory: Immediate Good; Recent Fair  Judgment: Fair  Insight: Fair   Chartered Certified Accountant: Fair  Attention Span: Fair  Recall: [employer] of Knowledge: Fair  Language: Fair  Psychomotor Activity  Psychomotor Activity: Psychomotor  Activity: Normal  Assets  Assets: Desire for Improvement; Resilience  Sleep  Sleep: Sleep: Good  Physical Exam: Physical Exam Constitutional:      General: She is not in acute distress.    Appearance: Normal appearance.  HENT:     Head: Normocephalic.     Nose: Nose normal.  Eyes:     Conjunctiva/sclera: Conjunctivae normal.  Pulmonary:     Effort: No respiratory distress.  Musculoskeletal:        General: Normal range of motion.     Cervical back: Normal range of motion.  Skin:    General: Skin is dry.  Neurological:     General: No focal deficit present.     Mental Status: She is alert and oriented to person, place, and time.  Psychiatric:        Behavior: Behavior normal.    Review of Systems  Constitutional:  Negative for chills, diaphoresis and fever.  HENT:  Positive for congestion. Negative for hearing loss and sore throat.   Eyes:  Negative for discharge and redness.  Respiratory:  Negative for cough and shortness of breath.   Cardiovascular:  Negative for chest pain and palpitations.  Gastrointestinal:  Negative for abdominal pain, constipation, diarrhea, nausea and vomiting.  Genitourinary:  Positive for frequency and urgency. Negative for dysuria, flank pain and hematuria.  Musculoskeletal:  Negative for falls.  Neurological:  Negative for dizziness, tremors, speech change, loss of consciousness, weakness and headaches.  Psychiatric/Behavioral:  Positive for depression, hallucinations and substance abuse. Negative for memory loss and suicidal ideas. The patient is nervous/anxious and has insomnia.   All other systems reviewed and are negative.  Blood pressure 111/81, pulse 87, temperature 99.1 F (37.3 C), temperature source Oral, resp. rate 16, height 5' 7 (1.702 m), weight 115.7 kg, SpO2 100%, not currently breastfeeding. Body mass index is 39.94 kg/m.  Treatment Plan Summary: Daily contact with patient to assess and evaluate symptoms and progress in  treatment and Medication management   Safety and Monitoring: Voluntary admission to inpatient psychiatric unit for safety, stabilization and treatment Daily contact with patient to assess and evaluate symptoms and progress in treatment Patient's case to be discussed in multi-disciplinary team meeting Observation Level : q15 minute checks Vital signs: q12 hours Precautions: Safety   Long Term Goal(s): Improvement in symptoms so as ready for discharge   Short Term Goals: Ability to identify changes in lifestyle to reduce recurrence of condition will improve, Ability to verbalize feelings will improve, Ability to disclose and discuss suicidal ideas, Ability to demonstrate self-control will improve, Ability to identify and develop effective coping behaviors will improve, Ability to maintain clinical measurements within normal limits will improve, Compliance with prescribed medications will improve, and Ability to identify triggers associated with substance abuse/mental health issues will improve  Diagnoses Principal Problem:   Schizoaffective disorder, bipolar type ([name]) Active Problems:   GAD (generalized anxiety disorder)   Alcohol use disorder   Tobacco use   Medications -Continue Risperdal  0.5 mg in the mornings for psychosis & mood stabilization -Continue Risperdal  2 mg nightly for mood stabilization & psychosis -Continue Buspar  10 mg TID for GAD -Discontinued Prozac  10 mg daily per patinent's request on 08/31/23 -Continue Vitamin D  50.000 units weekly for low Vit D level -Continue Ativan  0-4mg  Q 6 H as per the sliding scale-See MAR -Continue Albuterol  inhaler Q 4 H PRN for wheezing/SOB -Continue Nicotine  transdermal patch of nicotine  dependence -One time dose Valtrex  1000 mg given for herpes simplex virus 1/10 -Continue Valtrex  500 mg BID for genital herpes starting 08/31/23 at 2000.     PRNS -Continue Hydroxyzine  25 mg Q 6 H PRN for anxiety -Continue Trazodone  50 mg nightly PRN  for sleep -Continue Nicorette  gum 2 mg PRN for nicotine  cravings -Continue Agitation Protocol medications as per the MAR: Zyprexa / PO/IM PRN -Continue Tylenol  650 mg every 6 hours PRN for mild pain -Continue Maalox 30 mg every 4 hrs PRN for indigestion -Continue Milk of Magnesia as needed every 6 hrs for constipation   Labs Review: No new orders placed today    Discharge Planning: Social work and case management to assist with discharge planning and identification of hospital follow-up needs prior to discharge Estimated LOS: 5-7 days Discharge Concerns: Need to establish a safety plan; Medication compliance and effectiveness Discharge Goals: Return home with outpatient referrals for mental health follow-up including medication management/psychotherapy   I certify that inpatient services furnished can reasonably be expected to improve the patient's condition.    Blair Chiquita Hint, NP 09/01/2023, 2:14 PM Patient ID: Leon CHRISTELLA Alberts, female   DOB: [birth year], 23 y.o.   MRN: [id number] Patient ID: KAELEI WHEELER, female   DOB: 03/13/2001, 23 y.o.   MRN: [id number]

## 2023-09-01 NOTE — [facility] Group Notes (Signed)
 [facility] Group Notes:  (Nursing/MHT/Case Management/Adjunct)  Date:  09/01/2023  Time:  2000  Type of Therapy:   Wrap up group  Participation Level:  Active  Participation Quality:  Appropriate, Attentive, Sharing, and Supportive  Affect:  Depressed and Flat  Cognitive:  Alert  Insight:  Improving  Engagement in Group:  Engaged  Modes of Intervention:  Clarification, Education, and Support  Summary of Progress/Problems Positive thinking and positive change were discussed.   Lenora Manuelita RAMAN 09/01/2023, 9:48 PM

## 2023-09-01 NOTE — [facility] Group Notes (Signed)
 [facility] Group Notes:  (Nursing)  Date:  09/01/2023  Time:  1300  Type of Therapy:  Psychoeducational Skills  Participation Level:  Did Not Attend   Shela Nevin 09/01/2023, 5:49 PM

## 2023-09-01 NOTE — Plan of Care (Signed)
  Problem: Education: Goal: Knowledge of [facility] Education information/materials will improve Outcome: Progressing Goal: Emotional status will improve Outcome: Progressing Goal: Mental status will improve Outcome: Progressing Goal: Verbalization of understanding the information provided will improve Outcome: Progressing   Problem: Activity: Goal: Interest or engagement in activities will improve Outcome: Progressing Goal: Sleeping patterns will improve Outcome: Progressing   Problem: Coping: Goal: Ability to verbalize frustrations and anger appropriately will improve Outcome: Progressing Goal: Ability to demonstrate self-control will improve Outcome: Progressing   Problem: Health Behavior/Discharge Planning: Goal: Identification of resources available to assist in meeting health care needs will improve Outcome: Progressing Goal: Compliance with treatment plan for underlying cause of condition will improve Outcome: Progressing   Problem: Physical Regulation: Goal: Ability to maintain clinical measurements within normal limits will improve Outcome: Progressing   Problem: Safety: Goal: Periods of time without injury will increase Outcome: Progressing   Problem: Education: Goal: Knowledge of [facility] Education information/materials will improve Outcome: Progressing Goal: Emotional status will improve Outcome: Progressing Goal: Mental status will improve Outcome: Progressing Goal: Verbalization of understanding the information provided will improve Outcome: Progressing   Problem: Activity: Goal: Interest or engagement in activities will improve Outcome: Progressing Goal: Sleeping patterns will improve Outcome: Progressing   Problem: Coping: Goal: Ability to verbalize frustrations and anger appropriately will improve Outcome: Progressing Goal: Ability to demonstrate self-control will improve Outcome: Progressing   Problem: Health  Behavior/Discharge Planning: Goal: Identification of resources available to assist in meeting health care needs will improve Outcome: Progressing Goal: Compliance with treatment plan for underlying cause of condition will improve Outcome: Progressing   Problem: Physical Regulation: Goal: Ability to maintain clinical measurements within normal limits will improve Outcome: Progressing   Problem: Safety: Goal: Periods of time without injury will increase Outcome: Progressing   Problem: Education: Goal: Utilization of techniques to improve thought processes will improve Outcome: Progressing Goal: Knowledge of the prescribed therapeutic regimen will improve Outcome: Progressing   Problem: Activity: Goal: Interest or engagement in leisure activities will improve Outcome: Progressing Goal: Imbalance in normal sleep/wake cycle will improve Outcome: Progressing   Problem: Coping: Goal: Coping ability will improve Outcome: Progressing Goal: Will verbalize feelings Outcome: Progressing   Problem: Health Behavior/Discharge Planning: Goal: Ability to make decisions will improve Outcome: Progressing Goal: Compliance with therapeutic regimen will improve Outcome: Progressing   Problem: Role Relationship: Goal: Will demonstrate positive changes in social behaviors and relationships Outcome: Progressing   Problem: Safety: Goal: Ability to disclose and discuss suicidal ideas will improve Outcome: Progressing Goal: Ability to identify and utilize support systems that promote safety will improve Outcome: Progressing   Problem: Self-Concept: Goal: Will verbalize positive feelings about self Outcome: Progressing Goal: Level of anxiety will decrease Outcome: Progressing   Problem: Education: Goal: Ability to make informed decisions regarding treatment will improve Outcome: Progressing   Problem: Coping: Goal: Coping ability will improve Outcome: Progressing   Problem: Health  Behavior/Discharge Planning: Goal: Identification of resources available to assist in meeting health care needs will improve Outcome: Progressing   Problem: Medication: Goal: Compliance with prescribed medication regimen will improve Outcome: Progressing   Problem: Self-Concept: Goal: Ability to disclose and discuss suicidal ideas will improve Outcome: Progressing Goal: Will verbalize positive feelings about self Outcome: Progressing Note: Patient is on track. Patient will maintain adherence

## 2023-09-02 DIAGNOSIS — F250 Schizoaffective disorder, bipolar type: Secondary | ICD-10-CM | POA: Diagnosis not present

## 2023-09-02 MED ORDER — DOCUSATE SODIUM 100 MG PO CAPS
100.0000 mg | ORAL_CAPSULE | Freq: Two times a day (BID) | ORAL | Status: DC
  Administered 2023-09-02 – 2023-09-04 (×4): 100 mg via ORAL
  Filled 2023-09-02 (×8): qty 1

## 2023-09-02 MED ORDER — RISPERIDONE 0.5 MG PO TABS
2.5000 mg | ORAL_TABLET | Freq: Every day | ORAL | Status: DC
  Administered 2023-09-02: 2.5 mg via ORAL
  Filled 2023-09-02 (×2): qty 1

## 2023-09-02 MED ORDER — WHITE PETROLATUM EX OINT
TOPICAL_OINTMENT | CUTANEOUS | Status: AC
  Filled 2023-09-02: qty 5

## 2023-09-02 MED ORDER — POLYETHYLENE GLYCOL 3350 17 G PO PACK
17.0000 g | PACK | Freq: Once | ORAL | Status: AC
  Administered 2023-09-03: 17 g via ORAL
  Filled 2023-09-02 (×2): qty 1

## 2023-09-02 NOTE — Plan of Care (Signed)
   Problem: Education: Goal: Emotional status will improve Outcome: Progressing Goal: Mental status will improve Outcome: Progressing   Problem: Activity: Goal: Interest or engagement in activities will improve Outcome: Progressing Goal: Sleeping patterns will improve Outcome: Progressing

## 2023-09-02 NOTE — Progress Notes (Signed)
   09/02/23 1100  Psych Admission Type (Psych Patients Only)  Admission Status Voluntary  Psychosocial Assessment  Patient Complaints None  Eye Contact Brief  Facial Expression Flat  Affect Depressed  Speech Logical/coherent  Interaction Assertive;Minimal  Motor Activity Slow  Appearance/Hygiene Unremarkable  Behavior Characteristics Cooperative  Mood Depressed  Thought Process  Coherency WDL  Content WDL  Delusions None reported or observed  Perception WDL  Hallucination None reported or observed  Judgment Impaired  Confusion None  Danger to Self  Current suicidal ideation? Denies  Danger to Others  Danger to Others None reported or observed   Pt refused prozac

## 2023-09-02 NOTE — Progress Notes (Addendum)
 [facility] MD Progress Note  09/02/2023 12:36 PM KALEI MEDA  MRN:  [id number]  HPI: Per chart review: Patient is a 23 yo African American female with prior mental health diagnoses of bipolar d/o, MDD & GAD who presented to the [facility] on 08/28/23 with complaints of SI with a plan to jump off a bridge in the context of non medication adherence, ETOH abuse & psychosocial stressors. Pt was transferred and admitted to this [facility] voluntarily on 08/27/2022 for treatment and stabilization of his mental status.   24 hr chart review:  The patient's chart and nursing notes were reviewed. The patient's case was discussed in multidisciplinary team meeting. Vital signs within defined limits. Patient is compliant with routine medication regimen without difficulty. Sleep hours last night: 9.0 hours, as documented in the nursing flow sheets. No behavioral episodes or nursing concerns were reported at this time. PRN Trazodone  required last evening for sleep support, according to the nursing record.    Information Obtained Today during Interview: The patient was seen face-to-face by this provider. Chart was reviewed on 09/02/23 and case staffed with attending psychiatrist, RONAL Kitty. Upon approach, the patient was observed lying in bed awake and appears drowsy. She acknowledges feeling a little bit drowsy again this morning, attributing this to morning dose of risperidone . She describes mood as good and better than the first time I came in here.  She rates her depression and anxiety both as a 1 on a scale of 1-10 (with 10 being severe). She denies suicidal or homicidal ideation, auditory or visual hallucinations, paranoia, and delusional thinking. She reports experiencing some difficulty staying asleep, waking up intermittently throughout the night without a clear cause. The patient also reports experiencing constipation. She states that Milk of Magnesia has been ineffective in the past, and usually experiences  good results with MiraLAX .   The patient was observed attending group therapy in the unit this morning. 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  The patient is experiencing daytime drowsiness, potentially linked to the current Risperdal  regimen. We discussed the plan to discontinue the morning dose of risperidone  0.5 and add it to the bedtime dose to alleviate drowsiness. The patient also reported constipation and we discussed a one-time dose of MiraLAX  and initiating Colace twice a day. The patient agrees with the aforementioned plan.   Continued hospitalization remains necessary at this time, to treat and stabilize mental status prior to discharge.    We discussed changes to the current medication regimen: -Discontinue risperidone  0.5 mg morning dose due to daytime drowsiness -Titrate risperidone  2 mg bedtime dose to 2.5 mg for mood stabilization and psychosis -Start Colace 100 mg twice daily for constipation  -One time immediate dose Miralax  17 g for constipation   Medication adjustments made on 08/31/23: -Start Risperdal  0.5 mg in the mornings for psychosis & mood stabilization -Increase HS Risperdal  to 2. Mg nightly for mood stabilization & psychosis -Discontinue Prozac  10 mg daily per patient's request -Give Valtrex  1000 mg x 1 dose for herpes simplex virus -Start Valtrex  500 mg BID for genital herpes starting today at 2000.   Labs reviewed: STD related labs are pending.  Principal Problem: Schizoaffective disorder, bipolar type ([name]) Diagnosis: Principal Problem:   Schizoaffective disorder, bipolar type ([name]) Active Problems:   GAD (generalized anxiety disorder)   Alcohol use disorder   Tobacco use  Total  Time spent with patient: 45 minutes  Past Psychiatric History: See H & P  Past Medical History:  Past  Medical History:  Diagnosis Date   Anemia    Anxiety    Asthma    inhaler. 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   Seizures ([name])    last one in 2015 - on meds   Sickle cell trait ([name])    TBI (traumatic brain injury) ([name]) 2006    Past Surgical History:  Procedure Laterality Date   APPENDECTOMY     CESAREAN SECTION N/A 08/31/2021   Procedure: CESAREAN SECTION;  Surgeon: Kandis Devaughn Sayres, MD;  Location: [facility] LD ORS;  Service: Obstetrics;  Laterality: N/A;   CESAREAN SECTION N/A 06/05/2022   Procedure: CESAREAN SECTION;  Surgeon: Lorence Ozell CROME, MD;  Location: [facility] LD ORS;  Service: Obstetrics;  Laterality: N/A;   HERNIA REPAIR     INTESTINAL MALROTATION REPAIR  2001   Family History:  Family History  Problem Relation Age of Onset   Depression Mother    Stroke Mother    Obesity Mother    Post-traumatic stress disorder Mother    Anxiety disorder Mother    Hypertension Mother    Diabetes Mother    Schizophrenia Father    Kidney disease Father    Family Psychiatric  History: As listed above Social History:  Social History   Substance and Sexual Activity  Alcohol Use Not Currently   Comment: occasionally, prior to pregnancy     Social History   Substance and Sexual Activity  Drug Use Not Currently   Types: Marijuana   Comment: last 2020    Social History   Socioeconomic History   Marital status: Single    Spouse name: Not on file   Number of  children: Not on file   Years of education: Not on file   Highest education level: Not on file  Occupational History   Not on file  Tobacco Use   Smoking status: Every Day    Current packs/day: 0.25    Types: Cigarettes   Smokeless tobacco: Never   Tobacco comments:    black and milds, 2 cigs daily  Vaping Use   Vaping status: Former  Substance and Sexual Activity   Alcohol use: Not Currently    Comment: occasionally, prior to pregnancy   Drug use: Not Currently    Types: Marijuana    Comment: last 2020   Sexual activity: Not Currently    Partners: Male    Birth control/protection: None    Comment: missed shot for a few months, planning to go get shots in december  Other Topics Concern   Not on file  Social History Narrative   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   She attended [employer].    She lives with her parents, siblings, and grandfather.    She enjoys writing, singing, dancing, cooking, and shopping.   She attends [employer].  Social Drivers of Corporate Investment Banker Strain: Not on file  Food Insecurity: No Food Insecurity (08/28/2023)   Hunger Vital Sign    Worried About Running Out of Food in the Last Year: Never true    Ran Out of Food in the Last Year: Never true  Transportation Needs: No Transportation Needs (08/28/2023)   PRAPARE - Administrator, Civil Service (Medical): No    Lack of Transportation (Non-Medical): No  Physical Activity: Not on file  Stress: Stress Concern Present (08/08/2022)   Received from [facility]   [facility] of Occupational Health - Occupational Stress Questionnaire    Feeling of Stress : To some extent  Social Connections: Moderately Integrated (08/28/2023)   Social Connection and Isolation Panel [NHANES]    Frequency of Communication with Friends and Family: More than three times a week    Frequency of Social Gatherings with Friends and Family: More than three times a week     Attends Religious Services: 1 to 4 times per year    Active Member of [employer] or Organizations: No    Attends Engineer, Structural: Never    Marital Status: Married   Sleep: Fair  Appetite:  Fair  Current Medications: Current Facility-Administered Medications  Medication Dose Route Frequency Provider Last Rate Last Admin   acetaminophen  (TYLENOL ) tablet 650 mg  650 mg Oral Q6H PRN Mills, Shnese E, NP   650 mg at 08/29/23 0630   albuterol  (VENTOLIN  HFA) 108 (90 Base) MCG/ACT inhaler 1-2 puff  1-2 puff Inhalation Q4H PRN Massengill, Rankin, MD       alum & mag hydroxide-simeth (MAALOX/MYLANTA) 200-200-20 MG/5ML suspension 30 mL  30 mL Oral Q4H PRN Mills, Shnese E, NP       busPIRone  (BUSPAR ) tablet 10 mg  10 mg Oral TID Nkwenti, Doris, NP   10 mg at 09/02/23 0900   docusate sodium  (COLACE) capsule 100 mg  100 mg Oral BID Kaycee Mcgaugh H, NP       hydrOXYzine  (ATARAX ) tablet 25 mg  25 mg Oral TID PRN Johny Rankin, MD   25 mg at 08/30/23 2118   magnesium  hydroxide (MILK OF MAGNESIA) suspension 30 mL  30 mL Oral Daily PRN Mills, Shnese E, NP       multivitamin with minerals tablet 1 tablet  1 tablet Oral Daily Massengill, Nathan, MD   1 tablet at 09/02/23 0900   nicotine  (NICODERM CQ  - dosed in mg/24 hours) patch 21 mg  21 mg Transdermal Q0600 Mills, Shnese E, NP   21 mg at 08/30/23 9044   nicotine  polacrilex (NICORETTE ) gum 2 mg  2 mg Oral PRN Massengill, Rankin, MD       OLANZapine  (ZYPREXA ) injection 10 mg  10 mg Intramuscular TID PRN Massengill, Rankin, MD       OLANZapine  (ZYPREXA ) injection 5 mg  5 mg Intramuscular TID PRN Massengill, Rankin, MD       OLANZapine  zydis (ZYPREXA ) disintegrating tablet 5 mg  5 mg Oral TID PRN Massengill, Nathan, MD       polyethylene glycol (MIRALAX  / GLYCOLAX ) packet 17 g  17 g Oral Once Miron Marxen H, NP       risperiDONE  (RISPERDAL ) tablet 2.5 mg  2.5 mg Oral QHS Chantella Creech H, NP       sertraline  (ZOLOFT ) tablet 50 mg  50  mg Oral Daily Nkwenti, Doris, NP   50 mg at 09/02/23 0900   sulfamethoxazole -trimethoprim  (  BACTRIM  DS) 800-160 MG per tablet 1 tablet  1 tablet Oral Q12H Nkwenti, Doris, NP   1 tablet at 09/02/23 0900   thiamine  (Vitamin B-1) tablet 100 mg  100 mg Oral Daily Mills, Shnese E, NP   100 mg at 09/02/23 0900   traZODone  (DESYREL ) tablet 50 mg  50 mg Oral QHS PRN Massengill, Rankin, MD   50 mg at 09/01/23 2055   valACYclovir  (VALTREX ) tablet 500 mg  500 mg Oral BID Tex Drilling, NP   500 mg at 09/02/23 0900   Vitamin D  (Ergocalciferol ) (DRISDOL ) 1.25 MG (50000 UNIT) capsule 50,000 Units  50,000 Units Oral Q7 days Tex Drilling, NP   50,000 Units at 08/30/23 1700    Lab Results:  No results found for this or any previous visit (from the past 48 hours).   Blood Alcohol level:  Lab Results  Component Value Date   ETH <10 08/28/2023   ETH <10 07/13/2023    Metabolic Disorder Labs: Lab Results  Component Value Date   HGBA1C 5.4 08/30/2023   MPG 108.28 08/30/2023   MPG 114.02 07/13/2023   Lab Results  Component Value Date   PROLACTIN 14.4 11/14/2022   PROLACTIN 8.8 01/01/2017   Lab Results  Component Value Date   CHOL 142 08/30/2023   TRIG 104 08/30/2023   HDL 35 (L) 08/30/2023   CHOLHDL 4.1 08/30/2023   VLDL 21 08/30/2023   LDLCALC 86 08/30/2023   LDLCALC 102 (H) 03/30/2023    Physical Findings: AIMS:  , ,  ,  ,    CIWA:  CIWA-Ar Total: 1 COWS:     Musculoskeletal: Strength & Muscle Tone: within normal limits Gait & Station: normal Patient leans: N/A  Psychiatric Specialty Exam:  Presentation  General Appearance:  Casual; Fairly Groomed  Eye Contact: Good  Speech: Clear and Coherent  Speech Volume: Normal  Handedness: Right   Mood and Affect  Mood: -- (Better than the first time I came in here.)  Affect: Congruent   Thought Process  Thought Processes: Coherent  Descriptions of Associations:Intact  Orientation:Full (Time, Place and  Person)  Thought Content:Logical; WDL  History of Schizophrenia/Schizoaffective disorder:No  Duration of Psychotic Symptoms:No data recorded Hallucinations:Hallucinations: None (Denies)  Ideas of Reference:None  Suicidal Thoughts:Suicidal Thoughts: No (Denies)  Homicidal Thoughts:Homicidal Thoughts: No   Sensorium  Memory: Immediate Good; Recent Good; Remote Good  Judgment: Fair  Insight: Fair   Chartered Certified Accountant: Fair  Attention Span: Fair  Recall: [employer] of Knowledge: Fair  Language: Fair  Psychomotor Activity  Psychomotor Activity: Psychomotor Activity: Normal  Assets  Assets: Communication Skills; Desire for Improvement; Resilience; Social Support  Sleep  Sleep: Sleep: Fair  Physical Exam: Physical Exam Constitutional:      General: She is not in acute distress.    Appearance: Normal appearance.  HENT:     Head: Normocephalic.     Nose: Nose normal.  Eyes:     Conjunctiva/sclera: Conjunctivae normal.  Pulmonary:     Effort: No respiratory distress.  Musculoskeletal:        General: Normal range of motion.     Cervical back: Normal range of motion.  Skin:    General: Skin is dry.  Neurological:     General: No focal deficit present.     Mental Status: She is alert and oriented to person, place, and time.  Psychiatric:        Behavior: Behavior normal.    Review of Systems  Constitutional:  Negative for chills, diaphoresis and fever.  HENT:  Negative for congestion, hearing loss and sore throat.   Eyes:  Negative for discharge and redness.  Respiratory:  Negative for cough and shortness of breath.   Cardiovascular:  Negative for chest pain and palpitations.  Gastrointestinal:  Positive for constipation. Negative for abdominal pain, diarrhea, nausea and vomiting.  Genitourinary:  Positive for frequency and urgency. Negative for dysuria, flank pain and hematuria.  Musculoskeletal:  Negative for falls.   Neurological:  Negative for dizziness, tremors, speech change, loss of consciousness, weakness and headaches.  Psychiatric/Behavioral:  Positive for depression and substance abuse. Negative for hallucinations, memory loss and suicidal ideas. The patient is nervous/anxious and has insomnia.   All other systems reviewed and are negative.  Blood pressure 119/77, pulse 96, temperature 98.5 F (36.9 C), temperature source Oral, resp. rate 18, height 5' 7 (1.702 m), weight 115.7 kg, SpO2 100%, not currently breastfeeding. Body mass index is 39.94 kg/m.  Treatment Plan Summary: Daily contact with patient to assess and evaluate symptoms and progress in treatment and Medication management   Safety and Monitoring: Voluntary admission to inpatient psychiatric unit for safety, stabilization and treatment Daily contact with patient to assess and evaluate symptoms and progress in treatment Patient's case to be discussed in multi-disciplinary team meeting Observation Level : q15 minute checks Vital signs: q12 hours Precautions: Safety   Long Term Goal(s): Improvement in symptoms so as ready for discharge   Short Term Goals: Ability to identify changes in lifestyle to reduce recurrence of condition will improve, Ability to verbalize feelings will improve, Ability to disclose and discuss suicidal ideas, Ability to demonstrate self-control will improve, Ability to identify and develop effective coping behaviors will improve, Ability to maintain clinical measurements within normal limits will improve, Compliance with prescribed medications will improve, and Ability to identify triggers associated with substance abuse/mental health issues will improve   Diagnoses Principal Problem:   Schizoaffective disorder, bipolar type ([name]) Active Problems:   GAD (generalized anxiety disorder)   Alcohol use disorder   Tobacco use   Medications -Discontinue risperidone  0.5 mg morning dose due to daytime  drowsiness -Titrate risperidone  2 mg bedtime dose to 2.5 mg for mood stabilization and psychosis -Continue Buspar  10 mg TID for GAD -Discontinued Prozac  10 mg daily per patinent's request on 08/31/23 -Continue Vitamin D  50.000 units weekly for low Vit D level -Continue Ativan  0-4mg  Q 6 H as per the sliding scale-See MAR -Continue Albuterol  inhaler Q 4 H PRN for wheezing/SOB -Continue Nicotine  transdermal patch of nicotine  dependence -One time dose Valtrex  1000 mg given for herpes simplex virus 1/10 -Continue Valtrex  500 mg BID for genital herpes starting 08/31/23 at 2000. -Start Colace 100 mg twice daily for constipation  -One time immediate dose Miralax  17 g for constipation      PRNS -Continue Hydroxyzine  25 mg Q 6 H PRN for anxiety -Continue Trazodone  50 mg nightly PRN for sleep -Continue Nicorette  gum 2 mg PRN for nicotine  cravings -Continue Agitation Protocol medications as per the MAR: Zyprexa / PO/IM PRN -Continue Tylenol  650 mg every 6 hours PRN for mild pain -Continue Maalox 30 mg every 4 hrs PRN for indigestion -Continue Milk of Magnesia as needed every 6 hrs for constipation   Labs Review: No new orders placed today    Discharge Planning: Social work and case management to assist with discharge planning and identification of hospital follow-up needs prior to discharge Estimated LOS: 5-7 days Discharge Concerns: Need to establish a safety  plan; Medication compliance and effectiveness Discharge Goals: Return home with outpatient referrals for mental health follow-up including medication management/psychotherapy   I certify that inpatient services furnished can reasonably be expected to improve the patient's condition.    Blair Chiquita Hint, NP 09/02/2023, 12:36 PM Patient ID: Leon CHRISTELLA Alberts, female   DOB: 2000/09/03, 23 y.o.   MRN: [id number] Patient ID: SHERRELL WEIR, female   DOB: 08-Apr-2001, 22 y.o.   MRN: [id number] Patient ID: XITLALY AULT, female   DOB: 25-Feb-2001, 23 y.o.   MRN: [id number]

## 2023-09-02 NOTE — Plan of Care (Signed)
  Problem: Coping: Goal: Ability to verbalize frustrations and anger appropriately will improve Outcome: Progressing Goal: Ability to demonstrate self-control will improve Outcome: Progressing   Problem: Health Behavior/Discharge Planning: Goal: Identification of resources available to assist in meeting health care needs will improve Outcome: Progressing   

## 2023-09-02 NOTE — Progress Notes (Signed)
   09/01/23 2300  Psych Admission Type (Psych Patients Only)  Admission Status Voluntary  Psychosocial Assessment  Patient Complaints  (Cold sore and constipation)  Eye Contact Brief  Facial Expression Sad  Affect Flat  Speech Logical/coherent;Slow  Interaction Assertive  Motor Activity Slow  Appearance/Hygiene Unremarkable;In hospital gown  Behavior Characteristics Cooperative;Appropriate to situation  Mood Euthymic (My mood is good.  I stayed out of bed today.)  Thought Process  Coherency WDL  Content WDL  Delusions None reported or observed  Perception WDL  Hallucination None reported or observed  Judgment Impaired  Confusion None  Danger to Self  Current suicidal ideation? Denies  Self-Injurious Behavior No self-injurious ideation or behavior indicators observed or expressed   Agreement Not to Harm Self Yes  Description of Agreement Verbal  Danger to Others  Danger to Others None reported or observed

## 2023-09-02 NOTE — Plan of Care (Signed)
  Problem: Education: Goal: Knowledge of [address] [facility] Education information/materials will improve Outcome: Progressing Goal: Emotional status will improve Outcome: Progressing Goal: Mental status will improve Outcome: Progressing Goal: Verbalization of understanding the information provided will improve Outcome: Progressing   Problem: Activity: Goal: Interest or engagement in activities will improve Outcome: Progressing Goal: Sleeping patterns will improve Outcome: Progressing   Problem: Coping: Goal: Ability to verbalize frustrations and anger appropriately will improve Outcome: Progressing Goal: Ability to demonstrate self-control will improve Outcome: Progressing   Problem: Health Behavior/Discharge Planning: Goal: Identification of resources available to assist in meeting health care needs will improve Outcome: Progressing Goal: Compliance with treatment plan for underlying cause of condition will improve Outcome: Progressing   Problem: Physical Regulation: Goal: Ability to maintain clinical measurements within normal limits will improve Outcome: Progressing   Problem: Safety: Goal: Periods of time without injury will increase Outcome: Progressing   Problem: Education: Goal: Knowledge of [place] [facility] Education information/materials will improve Outcome: Progressing Goal: Emotional status will improve Outcome: Progressing Goal: Mental status will improve Outcome: Progressing Goal: Verbalization of understanding the information provided will improve Outcome: Progressing   Problem: Activity: Goal: Interest or engagement in activities will improve Outcome: Progressing Goal: Sleeping patterns will improve Outcome: Progressing   Problem: Coping: Goal: Ability to verbalize frustrations and anger appropriately will improve Outcome: Progressing Goal: Ability to demonstrate self-control will improve Outcome: Progressing   Problem: Health  Behavior/Discharge Planning: Goal: Identification of resources available to assist in meeting health care needs will improve Outcome: Progressing Goal: Compliance with treatment plan for underlying cause of condition will improve Outcome: Progressing   Problem: Physical Regulation: Goal: Ability to maintain clinical measurements within normal limits will improve Outcome: Progressing   Problem: Safety: Goal: Periods of time without injury will increase Outcome: Progressing   Problem: Education: Goal: Utilization of techniques to improve thought processes will improve Outcome: Progressing Goal: Knowledge of the prescribed therapeutic regimen will improve Outcome: Progressing   Problem: Activity: Goal: Interest or engagement in leisure activities will improve Outcome: Progressing Goal: Imbalance in normal sleep/wake cycle will improve Outcome: Progressing   Problem: Coping: Goal: Coping ability will improve Outcome: Progressing Goal: Will verbalize feelings Outcome: Progressing   Problem: Health Behavior/Discharge Planning: Goal: Ability to make decisions will improve Outcome: Progressing Goal: Compliance with therapeutic regimen will improve Outcome: Progressing   Problem: Role Relationship: Goal: Will demonstrate positive changes in social behaviors and relationships Outcome: Progressing   Problem: Safety: Goal: Ability to disclose and discuss suicidal ideas will improve Outcome: Progressing Goal: Ability to identify and utilize support systems that promote safety will improve Outcome: Progressing   Problem: Self-Concept: Goal: Will verbalize positive feelings about self Outcome: Progressing Goal: Level of anxiety will decrease Outcome: Progressing   Problem: Education: Goal: Ability to make informed decisions regarding treatment will improve Outcome: Progressing   Problem: Coping: Goal: Coping ability will improve Outcome: Progressing   Problem: Health  Behavior/Discharge Planning: Goal: Identification of resources available to assist in meeting health care needs will improve Outcome: Progressing   Problem: Medication: Goal: Compliance with prescribed medication regimen will improve Outcome: Progressing   Problem: Self-Concept: Goal: Ability to disclose and discuss suicidal ideas will improve Outcome: Progressing Goal: Will verbalize positive feelings about self Outcome: Progressing Note: Patient is on track. Patient will work on increased adherence

## 2023-09-02 NOTE — [facility] Group Notes (Signed)
 Type of Therapy and Topic:  Group Therapy:  Boundaries  Participation Level:  Minimal   Description of Group:  Patients in this group were introduced to 7 types of boundaries to address the extent to which we allow others into our lives, how we communicate, and where we draw the line when it comes to our personal space, emotions, and needs.  Different types of boundaries were defined and described, and each type was acted out.  Patients discussed what additional healthy boundaries could be helpful in their recovery and wellness after discharge in order to prevent future hospitalizations.   An emphasis was placed on following up with the discharge plan when they leave the hospital in order to continue becoming healthier and happier.  Two songs were played during group to help further patients' understanding.  Therapeutic Goals: 1)  demonstrate and identify the type of unhealthy and healthy boundaries  2)  discuss healthy boundaries and how to address it  3)  identify the patient's current level of healthy boundaries and   4)  elicit communicating healthy boundaries and trust    Summary of Patient Progress:  The patient expressed she comprehension of the concepts presented, and understand that there is a need to communicate healthy boundaries.  Current boundaries include patient does not know.  The patient indicated one healthy boundary that could be helpful if added would be patient did not share.  She participated very minimal and did not share her opinion.  Therapeutic Modalities:   Psychoeducation Brief Solution-Focused Therapy

## 2023-09-02 NOTE — [facility] Group Notes (Signed)
Pt did not attend goals group. 

## 2023-09-02 NOTE — [facility] Group Notes (Signed)
 Pt did not attend the group activity

## 2023-09-02 NOTE — Progress Notes (Signed)
   09/02/23 2015  Psych Admission Type (Psych Patients Only)  Admission Status Voluntary  Psychosocial Assessment  Patient Complaints None  Eye Contact Brief  Facial Expression Flat  Affect Depressed  Speech Logical/coherent  Interaction Assertive  Motor Activity Slow  Appearance/Hygiene In hospital gown  Behavior Characteristics Cooperative  Mood Depressed  Aggressive Behavior  Effect No apparent injury  Thought Process  Coherency WDL  Content WDL  Delusions WDL  Perception WDL  Hallucination None reported or observed  Judgment Impaired  Confusion WDL  Danger to Self  Current suicidal ideation? Denies

## 2023-09-02 NOTE — [facility] Group Notes (Signed)
 Adult Psychoeducational Group Note  Date:  09/02/2023 Time:  9:49 PM  Group Topic/Focus:  Wrap-Up Group:   The focus of this group is to help patients review their daily goal of treatment and discuss progress on daily workbooks.  Participation Level:  Active  Participation Quality:  Appropriate  Affect:  Flat  Cognitive:  Appropriate  Insight: Appropriate  Engagement in Group:  Engaged  Modes of Intervention:  Discussion  Additional Comments:   Pt states that she had a good day but was upset that she missed her daughters birthday. Pt states that she slept most of the day but has been trying to get up and be more productive. Pt spoke with her NP today and had a good conversation. Pt denies everything   Melissa [name] A Benjie Ricketson 09/02/2023, 9:49 PM

## 2023-09-03 DIAGNOSIS — F250 Schizoaffective disorder, bipolar type: Secondary | ICD-10-CM | POA: Diagnosis not present

## 2023-09-03 MED ORDER — BUSPIRONE HCL 10 MG PO TABS
10.0000 mg | ORAL_TABLET | Freq: Every day | ORAL | Status: DC
  Administered 2023-09-04: 10 mg via ORAL
  Filled 2023-09-03 (×3): qty 1
  Filled 2023-09-03: qty 2

## 2023-09-03 MED ORDER — BUSPIRONE HCL 10 MG PO TABS
20.0000 mg | ORAL_TABLET | Freq: Every day | ORAL | Status: DC
  Administered 2023-09-03: 20 mg via ORAL
  Filled 2023-09-03 (×3): qty 2

## 2023-09-03 MED ORDER — RISPERIDONE 2 MG PO TABS
2.0000 mg | ORAL_TABLET | Freq: Every day | ORAL | Status: DC
  Administered 2023-09-03: 2 mg via ORAL
  Filled 2023-09-03 (×4): qty 1

## 2023-09-03 NOTE — Plan of Care (Signed)
  Problem: Education: Goal: Knowledge of [facility] Education information/materials will improve Outcome: Progressing Goal: Emotional status will improve Outcome: Progressing Goal: Mental status will improve Outcome: Progressing Goal: Verbalization of understanding the information provided will improve Outcome: Progressing   Problem: Activity: Goal: Interest or engagement in activities will improve Outcome: Progressing Goal: Sleeping patterns will improve Outcome: Progressing   Problem: Coping: Goal: Ability to verbalize frustrations and anger appropriately will improve Outcome: Progressing Goal: Ability to demonstrate self-control will improve Outcome: Progressing   Problem: Health Behavior/Discharge Planning: Goal: Identification of resources available to assist in meeting health care needs will improve Outcome: Progressing Goal: Compliance with treatment plan for underlying cause of condition will improve Outcome: Progressing   Problem: Physical Regulation: Goal: Ability to maintain clinical measurements within normal limits will improve Outcome: Progressing   Problem: Safety: Goal: Periods of time without injury will increase Outcome: Progressing   Problem: Education: Goal: Knowledge of [facility] Education information/materials will improve Outcome: Progressing Goal: Emotional status will improve Outcome: Progressing Goal: Mental status will improve Outcome: Progressing Goal: Verbalization of understanding the information provided will improve Outcome: Progressing   Problem: Activity: Goal: Interest or engagement in activities will improve Outcome: Progressing Goal: Sleeping patterns will improve Outcome: Progressing   Problem: Coping: Goal: Ability to verbalize frustrations and anger appropriately will improve Outcome: Progressing Goal: Ability to demonstrate self-control will improve Outcome: Progressing   Problem: Health  Behavior/Discharge Planning: Goal: Identification of resources available to assist in meeting health care needs will improve Outcome: Progressing Goal: Compliance with treatment plan for underlying cause of condition will improve Outcome: Progressing   Problem: Physical Regulation: Goal: Ability to maintain clinical measurements within normal limits will improve Outcome: Progressing   Problem: Safety: Goal: Periods of time without injury will increase Outcome: Progressing   Problem: Education: Goal: Utilization of techniques to improve thought processes will improve Outcome: Progressing Goal: Knowledge of the prescribed therapeutic regimen will improve Outcome: Progressing   Problem: Activity: Goal: Interest or engagement in leisure activities will improve Outcome: Progressing Goal: Imbalance in normal sleep/wake cycle will improve Outcome: Progressing   Problem: Coping: Goal: Coping ability will improve Outcome: Progressing Goal: Will verbalize feelings Outcome: Progressing   Problem: Health Behavior/Discharge Planning: Goal: Ability to make decisions will improve Outcome: Progressing Goal: Compliance with therapeutic regimen will improve Outcome: Progressing   Problem: Role Relationship: Goal: Will demonstrate positive changes in social behaviors and relationships Outcome: Progressing   Problem: Safety: Goal: Ability to disclose and discuss suicidal ideas will improve Outcome: Progressing Goal: Ability to identify and utilize support systems that promote safety will improve Outcome: Progressing   Problem: Self-Concept: Goal: Will verbalize positive feelings about self Outcome: Progressing Goal: Level of anxiety will decrease Outcome: Progressing   Problem: Education: Goal: Ability to make informed decisions regarding treatment will improve Outcome: Progressing   Problem: Coping: Goal: Coping ability will improve Outcome: Progressing   Problem: Health  Behavior/Discharge Planning: Goal: Identification of resources available to assist in meeting health care needs will improve Outcome: Progressing   Problem: Medication: Goal: Compliance with prescribed medication regimen will improve Outcome: Progressing   Problem: Self-Concept: Goal: Ability to disclose and discuss suicidal ideas will improve Outcome: Progressing Goal: Will verbalize positive feelings about self Outcome: Progressing Note: Patient is on track. Patient will maintain adherence

## 2023-09-03 NOTE — Group Note (Signed)
 Recreation Therapy Group Note   Group Topic:Stress Management  Group Date: 09/03/2023 Start Time: 0935 End Time: 0955 Facilitators: Williard Keller-McCall, LRT,CTRS Location: [address] Dayroom   Group Topic: Stress Management  Goal Area(s) Addresses:  Patient will identify positive stress management techniques. Patient will identify benefits of using stress management post d/c.  Intervention: Insight Timer App  Activity: Meditation. LRT played a meditation that focused on having a mindful day. Patients were encouraged to follow along with the meditation and focus on their breathing. LRT shared with patients they could find other meditations on different apps, scripts, internet, etc.    Education:  Stress Management, Discharge Planning.   Education Outcome: Acknowledges Education   Affect/Mood: Appropriate   Participation Level: Active   Participation Quality: Independent   Behavior: Appropriate   Speech/Thought Process: Barely audible    Insight: Moderate   Judgement: Moderate   Modes of Intervention: App   Patient Response to Interventions:  Receptive   Education Outcome:  In group clarification offered    Clinical Observations/Individualized Feedback: Pt was quiet and attentive. Pt had moments were she seemed to struggle focusing on the meditation and would focus on what was happening in the hallway. Pt was appropriate throughout group session.    Plan: Continue to engage patient in RT group sessions 2-3x/week.   Melissa [name], LRT,CTRS 09/03/2023 11:55 AM

## 2023-09-03 NOTE — Plan of Care (Signed)
   Problem: Education: Goal: Emotional status will improve Outcome: Progressing Goal: Mental status will improve Outcome: Progressing   Problem: Activity: Goal: Interest or engagement in activities will improve Outcome: Progressing Goal: Sleeping patterns will improve Outcome: Progressing

## 2023-09-03 NOTE — [facility] Group Notes (Signed)
 Pt did attend AA meeting group

## 2023-09-03 NOTE — Progress Notes (Signed)
 [facility] MD Progress Note  09/03/2023 2:37 PM Melissa [name]  MRN:  [id number]  HPI: Per chart review: Patient is a 23 yo African American female with prior mental health diagnoses of bipolar d/o, MDD & GAD who presented to the [facility] on 08/28/23 with complaints of SI with a plan to jump off a bridge in the context of non medication adherence, ETOH abuse & psychosocial stressors. Pt was transferred and admitted to this [facility] voluntarily on 08/27/2022 for treatment and stabilization of his mental status.   24 hr chart review:  The patient's chart and nursing notes were reviewed. The patient's case was discussed in multidisciplinary team meeting. Vital signs within defined limits. Patient is compliant with routine medication regimen without difficulty. Sleep hours last night: 10.25 hours, as documented in the nursing flow sheets. No behavioral episodes or nursing concerns were reported at this time. No PRN medication required, according to the nursing record.   24 hr chart review: Vital signs within normal limits, patient remaining compliant with scheduled medications, required trazodone  50 mg last night for sleep.  Cooperative with care over the past 24 hours.  No behavioral concerns noted.  Sleeping through the night as per nursing reports.  Patient assessment note: On assessment today, the pt reports that their mood is euthymic, improved since admission, and stable. Denies feeling down, depressed, or sad.  Reports that anxiety symptoms are at manageable level.  Sleep is stable. Appetite is stable.  Concentration is without complaint.  Energy level is adequate. Denies having any suicidal thoughts. Denies having any suicidal intent and plan.  Denies having any HI.  Denies having psychotic symptoms.   Denies having side effects to current psychiatric medications.   Discussed discharge planning for tomorrow, 09/03/2022 as long as there is safety planning in place, and discharge f/u  appointments for continuity of care. Pt states that she is planning to go back to residing with friend with whom she was residing prior to this hospitalization.    Labs reviewed: STD related labs so far with HIV & RPR NR. GC/Chlamydia is pending.  Principal Problem: Schizoaffective disorder, bipolar type ([name]) Diagnosis: Principal Problem:   Schizoaffective disorder, bipolar type ([name]) Active Problems:   GAD (generalized anxiety disorder)   Alcohol use disorder   Tobacco use  Total Time spent with patient: 45 minutes  Past Psychiatric History: See H & P  Past Medical History:  Past Medical History:  Diagnosis Date   Anemia    Anxiety    Asthma    inhaler. 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   Seizures ([name])    last one in 2015 - on meds   Sickle cell trait ([name])    TBI (traumatic brain injury) ([name]) 2006    Past Surgical History:  Procedure Laterality Date   APPENDECTOMY     CESAREAN SECTION N/A 08/31/2021   Procedure: CESAREAN SECTION;  Surgeon: Kandis Devaughn Sayres, MD;  Location: [facility] LD ORS;  Service: Obstetrics;  Laterality: N/A;   CESAREAN SECTION N/A 06/05/2022   Procedure: CESAREAN SECTION;  Surgeon: Lorence Ozell CROME, MD;  Location: [facility] LD ORS;  Service: Obstetrics;  Laterality: N/A;   HERNIA REPAIR     INTESTINAL MALROTATION REPAIR  2001   Family History:  Family History  Problem Relation Age of Onset   Depression Mother    Stroke Mother    Obesity  Mother    Post-traumatic stress disorder Mother    Anxiety disorder Mother    Hypertension Mother    Diabetes Mother    Schizophrenia Father    Kidney disease Father    Family Psychiatric  History: As listed above Social History:  Social History   Substance and Sexual Activity  Alcohol Use Not Currently   Comment: occasionally, prior to pregnancy     Social History   Substance and Sexual Activity  Drug Use Not Currently   Types: Marijuana   Comment: last 2020    Social History   Socioeconomic History   Marital status: Single    Spouse name: Not on file   Number of children: Not on file   Years of education: Not on file   Highest education level: Not on file  Occupational History   Not on file  Tobacco Use   Smoking status: Every Day    Current packs/day: 0.25    Types: Cigarettes   Smokeless tobacco: Never   Tobacco comments:    black and milds, 2 cigs daily  Vaping Use   Vaping status: Former  Substance and Sexual Activity   Alcohol use: Not Currently    Comment: occasionally, prior to pregnancy   Drug use: Not Currently    Types: Marijuana    Comment: last 2020   Sexual activity: Not Currently    Partners: Male    Birth control/protection: None    Comment: missed shot for a few months, planning to go get shots in december  Other Topics Concern   Not on file  Social History Narrative   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   She attended [employer].    She lives with her parents, siblings, and grandfather.    She enjoys writing, singing, dancing, cooking, and shopping.   She attends [employer].   Social Drivers of Corporate Investment Banker Strain: Not on file  Food Insecurity: No Food Insecurity (08/28/2023)   Hunger Vital Sign    Worried About Running Out of Food in the Last Year: Never true    Ran Out of Food in the Last Year: Never true  Transportation Needs: No Transportation Needs (08/28/2023)   PRAPARE - Scientist, Research (physical Sciences) (Medical): No    Lack of Transportation (Non-Medical): No  Physical Activity: Not on file  Stress: Stress Concern Present (08/08/2022)   Received from [facility]   [facility] of Occupational Health - Occupational Stress Questionnaire    Feeling of Stress : To some extent  Social Connections: Moderately Integrated (08/28/2023)   Social Connection and Isolation Panel [NHANES]    Frequency of Communication with Friends and Family: More than three times a week    Frequency of Social Gatherings with Friends and Family: More than three times a week    Attends Religious Services: 1 to 4 times per year    Active Member of [employer] or Organizations: No    Attends Banker Meetings: Never  Marital Status: Married   Sleep: Fair  Appetite:  Fair  Current Medications: Current Facility-Administered Medications  Medication Dose Route Frequency Provider Last Rate Last Admin   acetaminophen  (TYLENOL ) tablet 650 mg  650 mg Oral Q6H PRN Mills, Shnese E, NP   650 mg at 08/29/23 0630   albuterol  (VENTOLIN  HFA) 108 (90 Base) MCG/ACT inhaler 1-2 puff  1-2 puff Inhalation Q4H PRN Massengill, Rankin, MD       alum & mag hydroxide-simeth (MAALOX/MYLANTA) 200-200-20 MG/5ML suspension 30 mL  30 mL Oral Q4H PRN Mills, Shnese E, NP       [START ON 09/04/2023] busPIRone  (BUSPAR ) tablet 10 mg  10 mg Oral Daily Paden Kuras, NP       busPIRone  (BUSPAR ) tablet 20 mg  20 mg Oral QHS Natoya Viscomi, NP       docusate sodium  (COLACE) capsule 100 mg  100 mg Oral BID Bennett, Christal H, NP   100 mg at 09/03/23 0900   hydrOXYzine  (ATARAX ) tablet 25 mg  25 mg Oral TID PRN Johny Rankin, MD   25 mg at 08/30/23 2118   magnesium  hydroxide (MILK OF MAGNESIA) suspension 30 mL  30 mL Oral Daily PRN Mills, Shnese E, NP       multivitamin with minerals tablet 1 tablet  1 tablet Oral Daily Massengill, Nathan, MD   1 tablet at 09/03/23 0900   nicotine  (NICODERM CQ  - dosed in  mg/24 hours) patch 21 mg  21 mg Transdermal Q0600 Mills, Shnese E, NP   21 mg at 08/30/23 9044   nicotine  polacrilex (NICORETTE ) gum 2 mg  2 mg Oral PRN Massengill, Rankin, MD       OLANZapine  (ZYPREXA ) injection 10 mg  10 mg Intramuscular TID PRN Massengill, Rankin, MD       OLANZapine  (ZYPREXA ) injection 5 mg  5 mg Intramuscular TID PRN Massengill, Rankin, MD       OLANZapine  zydis (ZYPREXA ) disintegrating tablet 5 mg  5 mg Oral TID PRN Massengill, Rankin, MD       risperiDONE  (RISPERDAL ) tablet 2 mg  2 mg Oral QHS Azie Mcconahy, NP       sertraline  (ZOLOFT ) tablet 50 mg  50 mg Oral Daily Osualdo Hansell, NP   50 mg at 09/03/23 0900   sulfamethoxazole -trimethoprim  (BACTRIM  DS) 800-160 MG per tablet 1 tablet  1 tablet Oral Q12H Azaylia Fong, NP   1 tablet at 09/03/23 0900   thiamine  (Vitamin B-1) tablet 100 mg  100 mg Oral Daily Mills, Shnese E, NP   100 mg at 09/03/23 0900   traZODone  (DESYREL ) tablet 50 mg  50 mg Oral QHS PRN Massengill, Rankin, MD   50 mg at 09/02/23 2109   valACYclovir  (VALTREX ) tablet 500 mg  500 mg Oral BID Tex Drilling, NP   500 mg at 09/03/23 0900   Vitamin D  (Ergocalciferol ) (DRISDOL ) 1.25 MG (50000 UNIT) capsule 50,000 Units  50,000 Units Oral Q7 days Tex Drilling, NP   50,000 Units at 08/30/23 1700    Lab Results:  No results found for this or any previous visit (from the past 48 hours).   Blood Alcohol level:  Lab Results  Component Value Date   [facility] <10 08/28/2023   ETH <10 07/13/2023    Metabolic Disorder Labs: Lab Results  Component Value Date   HGBA1C 5.4 08/30/2023   MPG 108.28 08/30/2023   MPG 114.02 07/13/2023   Lab Results  Component Value Date   PROLACTIN 14.4 11/14/2022   PROLACTIN 8.8  01/01/2017   Lab Results  Component Value Date   CHOL 142 08/30/2023   TRIG 104 08/30/2023   HDL 35 (L) 08/30/2023   CHOLHDL 4.1 08/30/2023   VLDL 21 08/30/2023   LDLCALC 86 08/30/2023   LDLCALC 102 (H) 03/30/2023    Physical Findings: AIMS:   , ,  ,  ,    CIWA:  CIWA-Ar Total: 1 COWS:     Musculoskeletal: Strength & Muscle Tone: within normal limits Gait & Station: normal Patient leans: N/A  Psychiatric Specialty Exam:  Presentation  General Appearance:  Appropriate for Environment; Fairly Groomed  Eye Contact: Good  Speech: Clear and Coherent  Speech Volume: Normal  Handedness: Right   Mood and Affect  Mood: -- (Better than the first time I came in here.)  Affect: Congruent   Thought Process  Thought Processes: Coherent  Descriptions of Associations:Intact  Orientation:Full (Time, Place and Person)  Thought Content:Logical  History of Schizophrenia/Schizoaffective disorder:No  Duration of Psychotic Symptoms:No data recorded Hallucinations:Hallucinations: None  Ideas of Reference:None  Suicidal Thoughts:Suicidal Thoughts: No  Homicidal Thoughts:Homicidal Thoughts: No   Sensorium  Memory: Immediate Good  Judgment: Good  Insight: Good   Executive Functions  Concentration: Good  Attention Span: Good  Recall: Good  Fund of Knowledge: Good  Language: Good  Psychomotor Activity  Psychomotor Activity: Psychomotor Activity: Normal  Assets  Assets: Financial Resources/Insurance; Resilience; Communication Skills  Sleep  Sleep: Sleep: Good  Physical Exam: Physical Exam Constitutional:      General: She is not in acute distress.    Appearance: Normal appearance.  HENT:     Head: Normocephalic.     Nose: Nose normal.  Eyes:     Conjunctiva/sclera: Conjunctivae normal.  Pulmonary:     Effort: No respiratory distress.  Musculoskeletal:        General: Normal range of motion.     Cervical back: Normal range of motion.  Skin:    General: Skin is dry.  Neurological:     General: No focal deficit present.     Mental Status: She is alert and oriented to person, place, and time.  Psychiatric:        Behavior: Behavior normal.    Review of Systems   Constitutional:  Negative for chills, diaphoresis and fever.  HENT:  Positive for congestion. Negative for hearing loss and sore throat.   Eyes:  Negative for discharge and redness.  Respiratory:  Negative for cough and shortness of breath.   Cardiovascular:  Negative for chest pain and palpitations.  Gastrointestinal:  Negative for abdominal pain, constipation, diarrhea, nausea and vomiting.  Genitourinary:  Positive for frequency and urgency. Negative for dysuria, flank pain and hematuria.  Musculoskeletal:  Negative for falls.  Neurological:  Negative for dizziness, tremors, speech change, loss of consciousness, weakness and headaches.  Psychiatric/Behavioral:  Positive for depression, hallucinations and substance abuse. Negative for memory loss and suicidal ideas. The patient is nervous/anxious and has insomnia.   All other systems reviewed and are negative.  Blood pressure 109/68, pulse 93, temperature 98.5 F (36.9 C), temperature source Oral, resp. rate 18, height 5' 7 (1.702 m), weight 115.7 kg, SpO2 100%, not currently breastfeeding. Body mass index is 39.94 kg/m.  Treatment Plan Summary: Daily contact with patient to assess and evaluate symptoms and progress in treatment and Medication management   Safety and Monitoring: Voluntary admission to inpatient psychiatric unit for safety, stabilization and treatment Daily contact with patient to assess and evaluate symptoms and progress in  treatment Patient's case to be discussed in multi-disciplinary team meeting Observation Level : q15 minute checks Vital signs: q12 hours Precautions: Safety   Long Term Goal(s): Improvement in symptoms so as ready for discharge   Short Term Goals: Ability to identify changes in lifestyle to reduce recurrence of condition will improve, Ability to verbalize feelings will improve, Ability to disclose and discuss suicidal ideas, Ability to demonstrate self-control will improve, Ability to identify  and develop effective coping behaviors will improve, Ability to maintain clinical measurements within normal limits will improve, Compliance with prescribed medications will improve, and Ability to identify triggers associated with substance abuse/mental health issues will improve   Diagnoses Principal Problem:   Schizoaffective disorder, bipolar type ([name]) Active Problems:   GAD (generalized anxiety disorder)   Alcohol use disorder   Tobacco use   Medications -Continue Risperdal  0.5 mg in the mornings for psychosis & mood stabilization -Decrease Risperdal  from 2.5 to 2 mg nightly for mood stabilization & psychosis -Change Buspar  to 10 mg in the mornings and 20 mg nightly due to c/o sedation.  -Discontinued Prozac  10 mg daily per patinent's request on 08/31/23 -Continue Vitamin D  50.000 units weekly for low Vit D level -Ativan  0-4mg  Q 6 H as per the sliding scale-Completed -Continue Albuterol  inhaler Q 4 H PRN for wheezing/SOB -Continue Nicotine  transdermal patch of nicotine  dependence -One time dose Valtrex  1000 mg given for herpes simplex virus 1/10 -Continue Valtrex  500 mg BID for genital herpes starting 08/31/23 at 2000.     PRNS -Continue Hydroxyzine  25 mg Q 6 H PRN for anxiety -Continue Trazodone  50 mg nightly PRN for sleep -Continue Nicorette  gum 2 mg PRN for nicotine  cravings -Continue Agitation Protocol medications as per the MAR: Zyprexa / PO/IM PRN -Continue Tylenol  650 mg every 6 hours PRN for mild pain -Continue Maalox 30 mg every 4 hrs PRN for indigestion -Continue Milk of Magnesia as needed every 6 hrs for constipation   Labs Review: No new orders placed today    Discharge Planning: Social work and case management to assist with discharge planning and identification of hospital follow-up needs prior to discharge Estimated LOS: 5-7 days Discharge Concerns: Need to establish a safety plan; Medication compliance and effectiveness Discharge Goals: Return home with  outpatient referrals for mental health follow-up including medication management/psychotherapy   I certify that inpatient services furnished can reasonably be expected to improve the patient's condition.    Donia Snell, NP 09/03/2023, 2:37 PM Patient ID: Melissa [name], female   DOB: June 10, 2001, 23 y.o.   MRN: [id number]

## 2023-09-03 NOTE — Progress Notes (Signed)
   09/03/23 1200  Psych Admission Type (Psych Patients Only)  Admission Status Voluntary  Psychosocial Assessment  Patient Complaints None  Eye Contact Fair  Facial Expression Flat  Affect Depressed  Speech Logical/coherent  Interaction Assertive  Motor Activity Slow  Appearance/Hygiene Unremarkable  Behavior Characteristics Cooperative  Mood Depressed  Thought Process  Coherency WDL  Content WDL  Delusions WDL  Perception WDL  Hallucination None reported or observed  Judgment Impaired  Confusion WDL  Danger to Self  Current suicidal ideation? Denies  Danger to Others  Danger to Others None reported or observed

## 2023-09-04 ENCOUNTER — Encounter (HOSPITAL_COMMUNITY): Payer: Self-pay

## 2023-09-04 DIAGNOSIS — F250 Schizoaffective disorder, bipolar type: Secondary | ICD-10-CM | POA: Diagnosis not present

## 2023-09-04 MED ORDER — VITAMIN D (ERGOCALCIFEROL) 1.25 MG (50000 UNIT) PO CAPS: 50000.0000 [IU] | ORAL_CAPSULE | ORAL | 0 refills | Status: AC

## 2023-09-04 MED ORDER — RISPERIDONE 2 MG PO TABS: 2.0000 mg | ORAL_TABLET | Freq: Every day | ORAL | 0 refills | Status: DC

## 2023-09-04 MED ORDER — TRAZODONE HCL 50 MG PO TABS: 50.0000 mg | ORAL_TABLET | Freq: Every evening | ORAL | 0 refills | Status: DC | PRN

## 2023-09-04 MED ORDER — VALACYCLOVIR HCL 500 MG PO TABS: 500.0000 mg | ORAL_TABLET | Freq: Two times a day (BID) | ORAL | 0 refills | Status: DC

## 2023-09-04 MED ORDER — SERTRALINE HCL 50 MG PO TABS: 50.0000 mg | ORAL_TABLET | Freq: Every day | ORAL | 0 refills | Status: DC

## 2023-09-04 MED ORDER — BUSPIRONE HCL 10 MG PO TABS: 20.0000 mg | ORAL_TABLET | Freq: Every day | ORAL | 0 refills | Status: DC

## 2023-09-04 MED ORDER — HYDROXYZINE HCL 25 MG PO TABS: 25.0000 mg | ORAL_TABLET | Freq: Three times a day (TID) | ORAL | 0 refills | Status: DC | PRN

## 2023-09-04 MED ORDER — SULFAMETHOXAZOLE-TRIMETHOPRIM 800-160 MG PO TABS: 1.0000 | ORAL_TABLET | Freq: Two times a day (BID) | ORAL | 0 refills | Status: DC

## 2023-09-04 MED ORDER — ALBUTEROL SULFATE HFA 108 (90 BASE) MCG/ACT IN AERS: 1.0000 | INHALATION_SPRAY | RESPIRATORY_TRACT | 0 refills | Status: DC | PRN

## 2023-09-04 MED ORDER — BUSPIRONE HCL 10 MG PO TABS: 10.0000 mg | ORAL_TABLET | Freq: Every day | ORAL | 0 refills | Status: DC

## 2023-09-04 NOTE — Discharge Summary (Signed)
 Physician Discharge Summary Note  Patient:  Melissa [name] is an 23 y.o., female MRN:  [id number] DOB:  September 26, 2000 Patient phone:  [phone] (home)  Patient address:   [address] [place] [address],  Total Time spent with patient: 45 minutes  Date of Admission:  08/28/2023 Date of Discharge: 09/04/2023  Reason for Admission: Per chart review: Patient is a 23 yo African American female with prior mental health diagnoses of bipolar d/o, MDD & GAD who presented to the [facility] on 08/28/23 with complaints of SI with a plan to jump off a bridge in the context of non medication adherence, ETOH abuse & psychosocial stressors. Pt was transferred and admitted to this [facility] voluntarily on 08/27/2022 for treatment and stabilization of his mental status.   Principal Problem: Schizoaffective disorder, bipolar type [facility]) Discharge Diagnoses: Principal Problem:   Schizoaffective disorder, bipolar type ([name]) Active Problems:   GAD (generalized anxiety disorder)   Alcohol use disorder   Tobacco use  Past Psychiatric History: See H & P  Past Medical History:  Past Medical History:  Diagnosis Date   Anemia    Anxiety    Asthma    inhaler. 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   Seizures ([name])    last one in 2015 - on meds   Sickle cell trait ([name])    TBI (traumatic brain injury) ([name]) 2006    Past Surgical History:  Procedure Laterality Date   APPENDECTOMY      CESAREAN SECTION N/A 08/31/2021   Procedure: CESAREAN SECTION;  Surgeon: Kandis Devaughn Sayres, MD;  Location: [facility];  Service: Obstetrics;  Laterality: N/A;   CESAREAN SECTION N/A 06/05/2022   Procedure: CESAREAN SECTION;  Surgeon: Lorence Ozell CROME, MD;  Location: [facility];  Service: Obstetrics;  Laterality: N/A;   HERNIA REPAIR     INTESTINAL MALROTATION REPAIR  2001   Family History:  Family History  Problem Relation Age of Onset   Depression Mother    Stroke Mother    Obesity Mother    Post-traumatic stress disorder Mother    Anxiety disorder Mother    Hypertension Mother    Diabetes Mother    Schizophrenia Father    Kidney disease Father    Family Psychiatric  History: See H & P Social History:  Social History   Substance and Sexual Activity  Alcohol Use Not Currently   Comment: occasionally, prior to pregnancy     Social History   Substance and Sexual Activity  Drug Use Not Currently   Types: Marijuana   Comment: last 2020    Social History   Socioeconomic History   Marital status: Single    Spouse name: Not on file   Number of children: Not on file   Years of education: Not on file   Highest education level: Not on file  Occupational History   Not on file  Tobacco Use   Smoking status: Every Day  Current packs/day: 0.25    Types: Cigarettes   Smokeless tobacco: Never   Tobacco comments:    black and milds, 2 cigs daily  Vaping Use   Vaping status: Former  Substance and Sexual Activity   Alcohol use: Not Currently    Comment: occasionally, prior to pregnancy   Drug use: Not Currently    Types: Marijuana    Comment: last 2020   Sexual activity: Not Currently    Partners: Male    Birth control/protection: None    Comment: missed shot for a few months, planning to go get shots in december  Other Topics Concern   Not on file  Social History Narrative   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   She attended [employer].    She  lives with her parents, siblings, and grandfather.    She enjoys writing, singing, dancing, cooking, and shopping.   She attends [employer].   Social Drivers of Corporate Investment Banker Strain: Not on file  Food Insecurity: No Food Insecurity (08/28/2023)   Hunger Vital Sign    Worried About Running Out of Food in the Last Year: Never true    Ran Out of Food in the Last Year: Never true  Transportation Needs: No Transportation Needs (08/28/2023)   PRAPARE - Administrator, Civil Service (Medical): No    Lack of Transportation (Non-Medical): No  Physical Activity: Not on file  Stress: Stress Concern Present (08/08/2022)   Received from [facility], [facility]   [employer] of Occupational Health - Occupational Stress Questionnaire    Feeling of Stress : To some extent  Social Connections: Moderately Integrated (08/28/2023)   Social Connection and Isolation Panel [NHANES]    Frequency of Communication with Friends and Family: More than three times a week    Frequency of Social Gatherings with Friends and Family: More than three times a week    Attends Religious Services: 1 to 4 times per year    Active Member of [employer] or Organizations: No    Attends Banker Meetings: Never    Marital Status: Married   Hospital Course:   During the patient's hospitalization, patient had extensive initial psychiatric evaluation, and follow-up psychiatric evaluations every day. Psychiatric diagnoses provided upon initial assessment: As listed above. Patient's psychiatric medications were adjusted on admission: As follows: -Change home Risperdal  from 2 mg daily to 1 mg BID -Continue Buspar  5 mg Q 8 H for GAD -Change Ativan  0-4mg  Q ^ H as per the sliding for ETOH detox. -Start Albuterol  inhaler Q 4 H PRN for wheezing/SOB -Start Nicotine  transdermal patch of nicotine  dependence   During the hospitalization, other adjustments were made to the patient's psychiatric medication  regimen: -Discontinued Prozac  10 mg daily per patinent's request on 08/31/23.-Ativan  0-4mg  Q 6 H as per the sliding scale for alcohol detox-Completed.   Medications at discharge are as follows: -Continue Risperdal  2 mg nightly for mood stabilization & psychosis -Continue Buspar  10 mg in the mornings and 20 mg nightly for GAD.  -Continue Vitamin D  50.000 units weekly for low Vit D level -Continue Albuterol  inhaler Q 4 H PRN for wheezing/SOB -Continue Nicotine  transdermal patch of nicotine  dependence -Continue Hydroxyzine  25 mg TID PRN for anxiety -Continue Valtrex  500 mg BID for genital herpes  -Continue Bactrim  DS x 2 more days, & stop & f/u with PCP if symptoms of UTI persist.   Patient's care was discussed during the interdisciplinary  team meeting every day during the hospitalization. The patient denies having side effects to prescribed psychiatric medication. Gradually, patient started adjusting to milieu. The patient was evaluated each day by a clinical provider to ascertain response to treatment. Improvement was noted by the patient's report of decreasing symptoms, improved sleep and appetite, affect, medication tolerance, behavior, and participation in unit programming.  Patient was asked each day to complete a self inventory noting mood, mental status, pain, new symptoms, anxiety and concerns. Symptoms were reported as significantly decreased or resolved completely by discharge.    On day of discharge, the patient reports that their mood is stable. The patient denied having suicidal thoughts for more than 48 hours prior to discharge.  Patient denies having homicidal thoughts.  Patient denies having auditory hallucinations.  Patient denies any visual hallucinations or other symptoms of psychosis. The patient is motivated to continue taking medication with a goal of continued improvement in mental health.    The patient reports their target psychiatric symptoms of depression, anxiety & insomnia  responded well to the psychiatric medications, and the patient reports overall benefit from this psychiatric hospitalization. Supportive psychotherapy was provided to the patient. The patient also participated in regular group therapy while hospitalized. Coping skills, problem solving as well as relaxation therapies were also part of the unit programming.   Labs were reviewed with the patient, and abnormal results were discussed with the patient.   The patient is able to verbalize their individual safety plan to this provider.   # It is recommended to the patient to continue psychiatric medications as prescribed, after discharge from the hospital.     # It is recommended to the patient to follow up with your outpatient psychiatric provider and PCP.   # It was discussed with the patient, the impact of alcohol, drugs, tobacco have been there overall psychiatric and medical wellbeing, and total abstinence from substance use was recommended the patient.ed.   # Prescriptions provided or sent directly to preferred pharmacy at discharge. Patient agreeable to plan. Given opportunity to ask questions. Appears to feel comfortable with discharge.    # In the event of worsening symptoms, the patient is instructed to call the crisis hotline (988), 911 and or go to the nearest ED for appropriate evaluation and treatment of symptoms. To follow-up with primary care provider for other medical issues, concerns and or health care needs   # Patient was discharged with a plan to follow up as noted below. Pt states that she will be residing, with a friend after discharge, as she is unable to stay with her parents, since they have custody of her children, and she can only visit them.   Total Time spent with patient: 45 minutes  Physical Findings: AIMS: 0 CIWA:  CIWA-Ar Total: 1 COWS:  0  Musculoskeletal: Strength & Muscle Tone: within normal limits Gait & Station: normal Patient leans: N/A  Psychiatric  Specialty Exam:  Presentation  General Appearance:  Appropriate for Environment; Fairly Groomed  Eye Contact: Fair  Speech: Clear and Coherent  Speech Volume: Normal  Handedness: Right   Mood and Affect  Mood: Euthymic  Affect: Congruent; Appropriate   Thought Process  Thought Processes: Coherent  Descriptions of Associations:Intact  Orientation:Full (Time, Place and Person)  Thought Content:Logical  History of Schizophrenia/Schizoaffective disorder:No  Duration of Psychotic Symptoms:No data recorded Hallucinations:Hallucinations: None  Ideas of Reference:None  Suicidal Thoughts:Suicidal Thoughts: No  Homicidal Thoughts:Homicidal Thoughts: No   Sensorium  Memory: Immediate Good  Judgment:  Good  Insight: Good   Executive Functions  Concentration: Good  Attention Span: Good  Recall: Metta Abe of Knowledge: Fair  Language: Good   Psychomotor Activity  Psychomotor Activity: Psychomotor Activity: Normal   Assets  Assets: Communication Skills; Resilience   Sleep  Sleep: Sleep: Good    Physical Exam: Physical Exam Constitutional:      Appearance: Normal appearance.  Eyes:     Pupils: Pupils are equal, round, and reactive to light.  Musculoskeletal:     Cervical back: Normal range of motion.  Skin:    General: Skin is warm.  Neurological:     Mental Status: She is alert and oriented to person, place, and time.  Psychiatric:        Mood and Affect: Mood normal.        Behavior: Behavior normal.        Thought Content: Thought content normal.        Judgment: Judgment normal.    Review of Systems  Psychiatric/Behavioral:  Positive for depression (Denies SI/HI/AVH, denies plan or intent to harm self or any one else) and substance abuse (Educated on cessation). Negative for hallucinations, memory loss and suicidal ideas. The patient is nervous/anxious (STable on current meds) and has insomnia (STable on current  meds).   All other systems reviewed and are negative.  Blood pressure 108/75, pulse 84, temperature 98.3 F (36.8 C), temperature source Oral, resp. rate 20, height 5' 7 (1.702 m), weight 115.7 kg, SpO2 100%, not currently breastfeeding. Body mass index is 39.94 kg/m.   Social History   Tobacco Use  Smoking Status Every Day   Current packs/day: 0.25   Types: Cigarettes  Smokeless Tobacco Never  Tobacco Comments   black and milds, 2 cigs daily   Tobacco Cessation:  A prescription for an FDA-approved tobacco cessation medication was offered at discharge and the patient refused-Refused a prescription , and it was not provided, but she was educated on the need to resume Nicotine  patch which she states she has at home.   Blood Alcohol level:  Lab Results  Component Value Date   ETH <10 08/28/2023   ETH <10 07/13/2023    Metabolic Disorder Labs:  Lab Results  Component Value Date   HGBA1C 5.4 08/30/2023   MPG 108.28 08/30/2023   MPG 114.02 07/13/2023   Lab Results  Component Value Date   PROLACTIN 14.4 11/14/2022   PROLACTIN 8.8 01/01/2017   Lab Results  Component Value Date   CHOL 142 08/30/2023   TRIG 104 08/30/2023   HDL 35 (L) 08/30/2023   CHOLHDL 4.1 08/30/2023   VLDL 21 08/30/2023   LDLCALC 86 08/30/2023   LDLCALC 102 (H) 03/30/2023    See Psychiatric Specialty Exam and Suicide Risk Assessment completed by Attending Physician prior to discharge.  Discharge destination:  Home  Is patient on multiple antipsychotic therapies at discharge:  No   Has Patient had three or more failed trials of antipsychotic monotherapy by history:  No  Recommended Plan for Multiple Antipsychotic Therapies: NA   Allergies as of 09/04/2023       Reactions   Benadryl  [diphenhydramine  Hcl] Other (See Comments)   Seizures   Haldol  [haloperidol ] Anaphylaxis, Swelling   Tongue swelling   Latex Swelling, Other (See Comments)   Labial edema after usage of latex condoms.     Shellfish Allergy Anaphylaxis, Other (See Comments)   Throat tightening   Zithromax [azithromycin] Anaphylaxis, Swelling   Camphor Other (See Comments)  Unknown reaction   Gluten Meal Other (See Comments)   GI Intolerance   Lactose Intolerance (gi) Diarrhea        Medication List     TAKE these medications      Indication  albuterol  108 (90 Base) MCG/ACT inhaler Commonly known as: VENTOLIN  HFA Inhale 1-2 puffs into the lungs every 4 (four) hours as needed for wheezing or shortness of breath.  Indication: Asthma   busPIRone  10 MG tablet Commonly known as: BUSPAR  Take 1 tablet (10 mg total) by mouth daily. What changed:  medication strength how much to take when to take this  Indication: Anxiety Disorder, Major Depressive Disorder   busPIRone  10 MG tablet Commonly known as: BUSPAR  Take 2 tablets (20 mg total) by mouth at bedtime. What changed: You were already taking a medication with the same name, and this prescription was added. Make sure you understand how and when to take each.  Indication: Anxiety Disorder   hydrOXYzine  25 MG tablet Commonly known as: ATARAX  Take 1 tablet (25 mg total) by mouth 3 (three) times daily as needed for anxiety. What changed:  medication strength how much to take  Indication: Feeling Anxious   nicotine  21 mg/24hr patch Commonly known as: NICODERM CQ  - dosed in mg/24 hours Place 1 patch (21 mg total) onto the skin daily at 6 (six) AM.  Indication: Nicotine  Addiction   risperiDONE  2 MG tablet Commonly known as: RISPERDAL  Take 1 tablet (2 mg total) by mouth at bedtime. What changed:  medication strength how much to take when to take this  Indication: MIXED BIPOLAR AFFECTIVE DISORDER   sertraline  50 MG tablet Commonly known as: ZOLOFT  Take 1 tablet (50 mg total) by mouth daily.  Indication: Generalized Anxiety Disorder, Major Depressive Disorder   sulfamethoxazole -trimethoprim  800-160 MG tablet Commonly known as: BACTRIM   DS Take 1 tablet by mouth every 12 (twelve) hours.  Indication: UTI   traZODone  50 MG tablet Commonly known as: DESYREL  Take 1 tablet (50 mg total) by mouth at bedtime as needed for sleep.  Indication: Trouble Sleeping   valACYclovir  500 MG tablet Commonly known as: VALTREX  Take 1 tablet (500 mg total) by mouth 2 (two) times daily.  Indication: Genital Herpes   Vitamin D  (Ergocalciferol ) 1.25 MG (50000 UNIT) Caps capsule Commonly known as: DRISDOL  Take 1 capsule (50,000 Units total) by mouth every 7 (seven) days. Start taking on: September 06, 2023  Indication: Vitamin D  Deficiency        Follow-up Information     [facility] Schedule an appointment as soon as possible for a visit.   Specialty: Psychology Why: A referral has been made to this provider for therapy services.  Please call personally, to schedule an appointment. Contact information: [address] [place] [address] [phone]         [facility] Go on 09/26/2023.   Why: You have an appointment for medication management services on 09/26/23 at 3:00 pm. The appointment will be held in person, but you may call to switch to Virtual. Contact information: [address] [place] [address] [phone]         [facility] Follow up.   Why: You have an appt for therapy on 09/04/2023 at 3:00 pm. This appt will be held in  person. Please ask for referral regarding Substance Abuse and Gambling. Contact information: [address] [place] [address] [phone]  Signed: Donia Snell, NP 09/04/2023, 11:49 AM

## 2023-09-04 NOTE — BH IP Treatment Plan (Signed)
 Interdisciplinary Treatment and Diagnostic Plan Update  09/03/2023 Time of Session: 12:25 pm- UPDATE Melissa [name] MRN: [id number]  Principal Diagnosis: Schizoaffective disorder, bipolar type ([name])  Secondary Diagnoses: Principal Problem:   Schizoaffective disorder, bipolar type ([name]) Active Problems:   GAD (generalized anxiety disorder)   Alcohol use disorder   Tobacco use   Current Medications:  Current Facility-Administered Medications  Medication Dose Route Frequency Provider Last Rate Last Admin   acetaminophen  (TYLENOL ) tablet 650 mg  650 mg Oral Q6H PRN Mills, Shnese E, NP   650 mg at 08/29/23 0630   albuterol  (VENTOLIN  HFA) 108 (90 Base) MCG/ACT inhaler 1-2 puff  1-2 puff Inhalation Q4H PRN Massengill, Rankin, MD       alum & mag hydroxide-simeth (MAALOX/MYLANTA) 200-200-20 MG/5ML suspension 30 mL  30 mL Oral Q4H PRN Mills, Shnese E, NP       busPIRone  (BUSPAR ) tablet 10 mg  10 mg Oral Daily Nkwenti, Shaquita Fort, NP       busPIRone  (BUSPAR ) tablet 20 mg  20 mg Oral QHS Tex Drilling, NP   20 mg at 09/03/23 2104   docusate sodium  (COLACE) capsule 100 mg  100 mg Oral BID Bennett, Christal H, NP   100 mg at 09/03/23 1723   hydrOXYzine  (ATARAX ) tablet 25 mg  25 mg Oral TID PRN Johny Rankin, MD   25 mg at 08/30/23 2118   magnesium  hydroxide (MILK OF MAGNESIA) suspension 30 mL  30 mL Oral Daily PRN Mills, Shnese E, NP       multivitamin with minerals tablet 1 tablet  1 tablet Oral Daily Massengill, Nathan, MD   1 tablet at 09/03/23 0900   nicotine  (NICODERM CQ  - dosed in mg/24 hours) patch 21 mg  21 mg Transdermal Q0600 Mills, Shnese E, NP   21 mg at 08/30/23 9044   nicotine  polacrilex (NICORETTE ) gum 2 mg  2 mg Oral PRN Massengill, Rankin, MD       OLANZapine  (ZYPREXA ) injection 10 mg  10 mg Intramuscular TID PRN Massengill, Rankin, MD       OLANZapine  (ZYPREXA ) injection 5 mg  5 mg Intramuscular TID PRN Massengill, Rankin, MD       OLANZapine  zydis (ZYPREXA )  disintegrating tablet 5 mg  5 mg Oral TID PRN Johny Rankin, MD       risperiDONE  (RISPERDAL ) tablet 2 mg  2 mg Oral QHS Nkwenti, Terion Hedman, NP   2 mg at 09/03/23 2105   sertraline  (ZOLOFT ) tablet 50 mg  50 mg Oral Daily Nkwenti, Tae Robak, NP   50 mg at 09/03/23 0900   sulfamethoxazole -trimethoprim  (BACTRIM  DS) 800-160 MG per tablet 1 tablet  1 tablet Oral Q12H Tex Drilling, NP   1 tablet at 09/03/23 2114   thiamine  (Vitamin B-1) tablet 100 mg  100 mg Oral Daily Mills, Shnese E, NP   100 mg at 09/03/23 0900   traZODone  (DESYREL ) tablet 50 mg  50 mg Oral QHS PRN Massengill, Rankin, MD   50 mg at 09/03/23 2105   valACYclovir  (VALTREX ) tablet 500 mg  500 mg Oral BID Tex Drilling, NP   500 mg at 09/03/23 2114   Vitamin D  (Ergocalciferol ) (DRISDOL ) 1.25 MG (50000 UNIT) capsule 50,000 Units  50,000 Units Oral Q7 days Tex Drilling, NP   50,000 Units at 08/30/23 1700   PTA Medications: Medications Prior to Admission  Medication Sig Dispense Refill Last Dose/Taking   busPIRone  (BUSPAR ) 5 MG tablet Take 1 tablet (5 mg total) by mouth 2 (two) times daily. (Patient taking  differently: Take 5 mg by mouth 2 (two) times daily as needed (anxiety).) 28 tablet 0    hydrOXYzine  (ATARAX ) 50 MG tablet Take 1 tablet (50 mg total) by mouth 3 (three) times daily as needed for anxiety. 75 tablet 1    nicotine  (NICODERM CQ  - DOSED IN MG/24 HOURS) 21 mg/24hr patch Place 1 patch (21 mg total) onto the skin daily at 6 (six) AM. 14 patch 0    risperiDONE  (RISPERDAL ) 1 MG tablet Take 1 tablet (1 mg total) by mouth 2 (two) times daily. 28 tablet 0     Patient Stressors: Loss of custody of 3 children   Substance abuse    Patient Strengths: Supportive family/friends   Treatment Modalities: Medication Management, Group therapy, Case management,  1 to 1 session with clinician, Psychoeducation, Recreational therapy.   Physician Treatment Plan for Primary Diagnosis: Schizoaffective disorder, bipolar type ([name]) Long Term  Goal(s): Improvement in symptoms so as ready for discharge   Short Term Goals: Ability to identify changes in lifestyle to reduce recurrence of condition will improve Ability to verbalize feelings will improve Ability to disclose and discuss suicidal ideas Ability to demonstrate self-control will improve Ability to identify and develop effective coping behaviors will improve Ability to maintain clinical measurements within normal limits will improve Compliance with prescribed medications will improve Ability to identify triggers associated with substance abuse/mental health issues will improve  Medication Management: Evaluate patient's response, side effects, and tolerance of medication regimen.  Therapeutic Interventions: 1 to 1 sessions, Unit Group sessions and Medication administration.  Evaluation of Outcomes: Progressing  Physician Treatment Plan for Secondary Diagnosis: Principal Problem:   Schizoaffective disorder, bipolar type ([name]) Active Problems:   GAD (generalized anxiety disorder)   Alcohol use disorder   Tobacco use  Long Term Goal(s): Improvement in symptoms so as ready for discharge   Short Term Goals: Ability to identify changes in lifestyle to reduce recurrence of condition will improve Ability to verbalize feelings will improve Ability to disclose and discuss suicidal ideas Ability to demonstrate self-control will improve Ability to identify and develop effective coping behaviors will improve Ability to maintain clinical measurements within normal limits will improve Compliance with prescribed medications will improve Ability to identify triggers associated with substance abuse/mental health issues will improve     Medication Management: Evaluate patient's response, side effects, and tolerance of medication regimen.  Therapeutic Interventions: 1 to 1 sessions, Unit Group sessions and Medication administration.  Evaluation of Outcomes: Adequate for  Discharge   RN Treatment Plan for Primary Diagnosis: Schizoaffective disorder, bipolar type ([name]) Long Term Goal(s): Knowledge of disease and therapeutic regimen to maintain health will improve  Short Term Goals: Ability to verbalize frustration and anger appropriately will improve, Ability to participate in decision making will improve, and Ability to disclose and discuss suicidal ideas  Medication Management: RN will administer medications as ordered by provider, will assess and evaluate patient's response and provide education to patient for prescribed medication. RN will report any adverse and/or side effects to prescribing provider.  Therapeutic Interventions: 1 on 1 counseling sessions, Psychoeducation, Medication administration, Evaluate responses to treatment, Monitor vital signs and CBGs as ordered, Perform/monitor CIWA, COWS, AIMS and Fall Risk screenings as ordered, Perform wound care treatments as ordered.  Evaluation of Outcomes: Progressing   LCSW Treatment Plan for Primary Diagnosis: Schizoaffective disorder, bipolar type ([name]) Long Term Goal(s): Safe transition to appropriate next level of care at discharge, Engage patient in therapeutic group addressing interpersonal concerns.  Short Term  Goals: Engage patient in aftercare planning with referrals and resources and Increase ability to appropriately verbalize feelings  Therapeutic Interventions: Assess for all discharge needs, 1 to 1 time with Social worker, Explore available resources and support systems, Assess for adequacy in community support network, Educate family and significant other(s) on suicide prevention, Complete Psychosocial Assessment, Interpersonal group therapy.  Evaluation of Outcomes: Adequate for Discharge   Progress in Treatment:  Attending groups: No. Participating in groups: No. Taking medication as prescribed: Yes. Toleration medication: Yes. Family/Significant other contact made:  consents  pending Patient understands diagnosis: Yes. Discussing patient identified problems/goals with staff: Yes. Medical problems stabilized or resolved: Yes. Denies suicidal/homicidal ideation: Yes. Issues/concerns per patient self-inventory: No.   New problem(s) identified:  No   New Short Term/Long Term Goal(s):     medication stabilization, elimination of SI thoughts, development of comprehensive mental wellness plan.     Patient Goals:  to get help with alcohol use and depression.  Patient would like a referral to a psychiatrist and therapist in [place].   Discharge Plan or Barriers:  Patient recently admitted. CSW will continue to follow and assess for appropriate referrals and possible discharge planning.     Reason for Continuation of Hospitalization: Depression Medication stabilization Suicidal ideation   Estimated Length of Stay:  5 - 7 days   Last 3 [place] Suicide Severity Risk Score: Flowsheet Row [facility] ([facility] Most recent reading at 08/28/2023  8:29 PM ED from 08/28/2023 in [facility] Emergency Department at [facility] Most recent reading at 08/28/2023  3:55 PM ED from 07/16/2023 in [facility] at [place] Most recent reading at 07/16/2023  3:43 PM  C-SSRS RISK CATEGORY High Risk High Risk No Risk       Last PHQ 2/9 Scores:    03/14/2023    9:21 AM 01/24/2023    8:32 AM 04/07/2022   10:55 AM  Depression screen PHQ 2/9  Decreased Interest 1 2 1   Down, Depressed, Hopeless 2 1 1   PHQ - 2 Score 3 3 2   Altered sleeping 1 3 0  Tired, decreased energy 3 2 1   Change in appetite 2 3 0  Feeling bad or failure about yourself  1 3 0  Trouble concentrating 1 2 1   Moving slowly or fidgety/restless 1 2 1   Suicidal thoughts 1 1 0  PHQ-9 Score 13 19 5   Difficult doing work/chores Not difficult at all Somewhat difficult     Scribe for Treatment Team: Benjaman Donia JONELLE ISRAEL 09/04/2023 5:04 AM

## 2023-09-04 NOTE — Progress Notes (Signed)
 Patient discharged from [facility] on 09/04/23. Patient denies SI, plan, and intention. Suicide safety plan completed, reviewed with this RN, given to the patient, and a copy in the chart. Patient denies HI/AVH upon discharge. Patient rates her depression a 0/10 and her anxiety a 0/10. Patient is alert, oriented, and cooperative. RN provided patient with discharge paperwork and reviewed information with patient. Patient expressed that she understood all of the discharge instructions. Pt was satisfied with belongings returned to her from the locker and at bedside. Discharged patient to [facility] waiting room. Pt's grandfather awaiting patient in the [facility] waiting room.

## 2023-09-04 NOTE — BHH Suicide Risk Assessment (Addendum)
 Suicide Risk Assessment  Discharge Assessment    [facility] Discharge Suicide Risk Assessment   Principal Problem: Schizoaffective disorder, bipolar type [facility]) Discharge Diagnoses: Principal Problem:   Schizoaffective disorder, bipolar type ([name]) Active Problems:   GAD (generalized anxiety disorder)   Alcohol use disorder   Tobacco use  PI: Per chart review: Patient is a 23 yo African American female with prior mental health diagnoses of bipolar d/o, MDD & GAD who presented to the [facility] on 08/28/23 with complaints of SI with a plan to jump off a bridge in the context of non medication adherence, ETOH abuse & psychosocial stressors. Pt was transferred and admitted to this [facility] voluntarily on 08/27/2022 for treatment and stabilization of his mental status.    During the patient's hospitalization, patient had extensive initial psychiatric evaluation, and follow-up psychiatric evaluations every day. Psychiatric diagnoses provided upon initial assessment: As listed above. Patient's psychiatric medications were adjusted on admission: As follows: -Change home Risperdal  from 2 mg daily to 1 mg BID -Continue Buspar  5 mg Q 8 H for GAD -Change Ativan  0-4mg  Q ^ H as per the sliding for ETOH detox. -Start Albuterol  inhaler Q 4 H PRN for wheezing/SOB -Start Nicotine  transdermal patch of nicotine  dependence  During the hospitalization, other adjustments were made to the patient's psychiatric medication regimen: -Discontinued Prozac  10 mg daily per patinent's request on 08/31/23.-Ativan  0-4mg  Q 6 H as per the sliding scale for alcohol detox-Completed.  Medications at discharge are as follows: -Continue Risperdal  2 mg nightly for mood stabilization & psychosis -Continue Buspar  10 mg in the mornings and 20 mg nightly for GAD.  -Continue Vitamin D  50.000 units weekly for low Vit D level -Continue Albuterol  inhaler Q 4 H PRN for wheezing/SOB -Continue Nicotine  transdermal patch of nicotine  dependence -Continue  Hydroxyzine  25 mg TID PRN for anxiety -Continue Valtrex  500 mg BID for genital herpes  -Continue Bactrim  DS x 2 more days, & stop & f/u with PCP if symptoms of UTI persist.  Patient's care was discussed during the interdisciplinary team meeting every day during the hospitalization. The patient denies having side effects to prescribed psychiatric medication. Gradually, patient started adjusting to milieu. The patient was evaluated each day by a clinical provider to ascertain response to treatment. Improvement was noted by the patient's report of decreasing symptoms, improved sleep and appetite, affect, medication tolerance, behavior, and participation in unit programming.  Patient was asked each day to complete a self inventory noting mood, mental status, pain, new symptoms, anxiety and concerns. Symptoms were reported as significantly decreased or resolved completely by discharge.   On day of discharge, the patient reports that their mood is stable. The patient denied having suicidal thoughts for more than 48 hours prior to discharge.  Patient denies having homicidal thoughts.  Patient denies having auditory hallucinations.  Patient denies any visual hallucinations or other symptoms of psychosis. The patient is motivated to continue taking medication with a goal of continued improvement in mental health.   The patient reports their target psychiatric symptoms of depression, anxiety & insomnia responded well to the psychiatric medications, and the patient reports overall benefit from this psychiatric hospitalization. Supportive psychotherapy was provided to the patient. The patient also participated in regular group therapy while hospitalized. Coping skills, problem solving as well as relaxation therapies were also part of the unit programming.  Labs were reviewed with the patient, and abnormal results were discussed with the patient.  The patient is able to verbalize their individual safety plan  to this  provider.  # It is recommended to the patient to continue psychiatric medications as prescribed, after discharge from the hospital.    # It is recommended to the patient to follow up with your outpatient psychiatric provider and PCP.  # It was discussed with the patient, the impact of alcohol, drugs, tobacco have been there overall psychiatric and medical wellbeing, and total abstinence from substance use was recommended the patient.ed.  # Prescriptions provided or sent directly to preferred pharmacy at discharge. Patient agreeable to plan. Given opportunity to ask questions. Appears to feel comfortable with discharge.    # In the event of worsening symptoms, the patient is instructed to call the crisis hotline (988), 911 and or go to the nearest ED for appropriate evaluation and treatment of symptoms. To follow-up with primary care provider for other medical issues, concerns and or health care needs  # Patient was discharged with a plan to follow up as noted below. Pt states that she will be residing, with a friend after discharge, as she is unable to stay with her parents, since they have custody of her children, and she can only visit them.  Total Time spent with patient: 45 minutes  Musculoskeletal: Strength & Muscle Tone: within normal limits Gait & Station: normal Patient leans: N/A  Psychiatric Specialty Exam  Presentation  General Appearance:  Appropriate for Environment; Fairly Groomed  Eye Contact: Fair  Speech: Clear and Coherent  Speech Volume: Normal  Handedness: Right   Mood and Affect  Mood: Euthymic  Duration of Depression Symptoms: Greater than two weeks  Affect: Congruent; Appropriate   Thought Process  Thought Processes: Coherent  Descriptions of Associations:Intact  Orientation:Full (Time, Place and Person)  Thought Content:Logical  History of Schizophrenia/Schizoaffective disorder:No  Duration of Psychotic Symptoms:No data  recorded Hallucinations:Hallucinations: None  Ideas of Reference:None  Suicidal Thoughts:Suicidal Thoughts: No  Homicidal Thoughts:Homicidal Thoughts: No   Sensorium  Memory: Immediate Good  Judgment: Good  Insight: Good   Executive Functions  Concentration: Good  Attention Span: Good  Recall: Good  Fund of Knowledge: Fair  Language: Good   Psychomotor Activity  Psychomotor Activity: Psychomotor Activity: Normal   Assets  Assets: Communication Skills; Resilience   Sleep  Sleep: Sleep: Good   Physical Exam: Physical Exam Psychiatric:        Mood and Affect: Mood normal.        Behavior: Behavior normal.        Thought Content: Thought content normal.        Judgment: Judgment normal.    Review of Systems  Psychiatric/Behavioral:  Positive for depression (Denies SI/HI, denies plan or intent to harm self or others after discharge). Negative for hallucinations, memory loss, substance abuse and suicidal ideas. The patient is nervous/anxious (stable for discharge) and has insomnia.   All other systems reviewed and are negative.  Blood pressure 108/75, pulse 84, temperature 98.3 F (36.8 C), temperature source Oral, resp. rate 20, height 5' 7 (1.702 m), weight 115.7 kg, SpO2 100%, not currently breastfeeding. Body mass index is 39.94 kg/m.  Mental Status Per Nursing Assessment::   On Admission:  Suicidal ideation indicated by patient Denies SI/HI, denies intent or plan to harm others or self at discharge. Demographic Factors:  Adolescent or young adult  Loss Factors: Financial problems/change in socioeconomic status  Historical Factors: Family history of mental illness or substance abuse  Risk Reduction Factors:   Responsible for children under 70 years of age  Continued Clinical  Symptoms:  Unstable or Poor Therapeutic Relationship Previous Psychiatric Diagnoses and Treatments  Cognitive Features That Contribute To Risk:  None     Suicide Risk:  Mild:  There are no identifiable suicide plans, no associated intent, mild dysphoria and related symptoms, good self-control (both objective and subjective assessment), few other risk factors, and identifiable protective factors, including available and accessible social support.    Follow-up Information     [facility] Schedule an appointment as soon as possible for a visit.   Specialty: Psychology Why: A referral has been made to this provider for therapy services.  Please call personally, to schedule an appointment. Contact information: [address] [place] [address] [phone]         [facility] Go on 09/26/2023.   Why: You have an appointment for medication management services on 09/26/23 at 3:00 pm. The appointment will be held in person, but you may call to switch to Virtual. Contact information: [address] [place] [address] [phone]                Donia Snell, NP 09/04/2023, 9:06 AM

## 2023-09-04 NOTE — Plan of Care (Signed)
   Problem: Education: Goal: Emotional status will improve Outcome: Progressing Goal: Mental status will improve Outcome: Progressing   Problem: Activity: Goal: Interest or engagement in activities will improve Outcome: Progressing Goal: Sleeping patterns will improve Outcome: Progressing

## 2023-09-04 NOTE — Progress Notes (Addendum)
  [facility] Adult Case Management Discharge Plan :  Will you be returning to the same living situation after discharge:  Yes,  pt will be returning home with friends.  At discharge, do you have transportation home?: Yes,  pt will be transported by grandfather, Lynwood Shutter [phone] Do you have the ability to pay for your medications: Yes,  pt has active medical coverage  Release of information consent forms completed and in the chart;  Patient's signature needed at discharge.  Patient to Follow up at:  Follow-up Information     [facility] Schedule an appointment as soon as possible for a visit.   Specialty: Psychology Why: A referral has been made to this provider for therapy services.  Please call personally, to schedule an appointment. Contact information: [address] [place] [address] [phone]         [facility] Go on 09/26/2023.   Why: You have an appointment for medication management services on 09/26/23 at 3:00 pm. The appointment will be held in person, but you may call to switch to Virtual. Contact information: [address] [place] [address] [phone]         [facility] Follow up.   Why: You have an appt for therapy on 09/04/2023 at 3:00 pm. This appt will be held in  person. Please ask for referral regarding Substance Abuse and Gambling. Contact information: [address] [place] [address] [phone]                 Next level of care provider has access to [facility] Link:no  Safety Planning and Suicide Prevention discussed: Yes,  Safety Planning completed with   Doyce Norris (Mother) [phone]       Has patient been referred to the Quitline?: pt has requested information on Quit Line  Patient has been referred for addiction treatment: Patient refused referral for treatment; referral information given to patient at discharge.  Cordie Buening,  Donia SAUNDERS, LCSWA 09/04/2023, 10:27 AM

## 2023-09-04 NOTE — Progress Notes (Signed)
   09/04/23 0841  Psych Admission Type (Psych Patients Only)  Admission Status Voluntary  Psychosocial Assessment  Patient Complaints None  Eye Contact Fair  Facial Expression Flat;Sad  Affect Depressed  Speech Logical/coherent  Interaction Assertive  Motor Activity Slow  Appearance/Hygiene Unremarkable  Behavior Characteristics Cooperative  Mood Depressed  Thought Process  Coherency WDL  Content WDL  Delusions WDL  Perception WDL  Hallucination None reported or observed  Judgment Impaired  Confusion None  Danger to Self  Current suicidal ideation? Denies  Description of Suicide Plan No plan  Self-Injurious Behavior No self-injurious ideation or behavior indicators observed or expressed   Agreement Not to Harm Self Yes  Description of Agreement Pt verbally contracts for safety  Danger to Others  Danger to Others None reported or observed

## 2023-09-06 ENCOUNTER — Encounter (HOSPITAL_COMMUNITY): Payer: Self-pay

## 2023-09-06 ENCOUNTER — Emergency Department (HOSPITAL_COMMUNITY)
Admission: EM | Admit: 2023-09-06 | Discharge: 2023-09-07 | Disposition: A | Discharge status: Home / Self Care | Payer: 59 | Attending: Emergency Medicine | Admitting: Emergency Medicine

## 2023-09-06 ENCOUNTER — Emergency Department (HOSPITAL_COMMUNITY): Payer: 59

## 2023-09-06 DIAGNOSIS — J90 Pleural effusion, not elsewhere classified: Secondary | ICD-10-CM | POA: Diagnosis not present

## 2023-09-06 DIAGNOSIS — R000 Tachycardia, unspecified: Secondary | ICD-10-CM | POA: Diagnosis not present

## 2023-09-06 DIAGNOSIS — R079 Chest pain, unspecified: Secondary | ICD-10-CM | POA: Diagnosis not present

## 2023-09-06 DIAGNOSIS — R072 Precordial pain: Secondary | ICD-10-CM | POA: Diagnosis not present

## 2023-09-06 DIAGNOSIS — I10 Essential (primary) hypertension: Secondary | ICD-10-CM | POA: Diagnosis not present

## 2023-09-06 DIAGNOSIS — G4489 Other headache syndrome: Secondary | ICD-10-CM | POA: Diagnosis not present

## 2023-09-06 DIAGNOSIS — R7989 Other specified abnormal findings of blood chemistry: Secondary | ICD-10-CM | POA: Diagnosis not present

## 2023-09-06 DIAGNOSIS — Z91040 Latex allergy status: Secondary | ICD-10-CM | POA: Diagnosis not present

## 2023-09-06 LAB — CBC
HCT: 36.6 % (ref 36.0–46.0)
Hemoglobin: 12.1 g/dL (ref 12.0–15.0)
MCH: 26.1 pg (ref 26.0–34.0)
MCHC: 33.1 g/dL (ref 30.0–36.0)
MCV: 78.9 fL — ABNORMAL LOW (ref 80.0–100.0)
Platelets: 227 10*3/uL (ref 150–400)
RBC: 4.64 MIL/uL (ref 3.87–5.11)
RDW: 15 % (ref 11.5–15.5)
WBC: 9.7 10*3/uL (ref 4.0–10.5)
nRBC: 0 % (ref 0.0–0.2)

## 2023-09-06 LAB — BASIC METABOLIC PANEL
Anion gap: 10 (ref 5–15)
BUN: 9 mg/dL (ref 6–20)
CO2: 21 mmol/L — ABNORMAL LOW (ref 22–32)
Calcium: 9 mg/dL (ref 8.9–10.3)
Chloride: 103 mmol/L (ref 98–111)
Creatinine, Ser: 0.98 mg/dL (ref 0.44–1.00)
GFR, Estimated: >60 mL/min (ref >60)
Glucose, Bld: 84 mg/dL (ref 70–99)
Potassium: 3.1 mmol/L — ABNORMAL LOW (ref 3.5–5.1)
Sodium: 134 mmol/L — ABNORMAL LOW (ref 135–145)

## 2023-09-06 LAB — TROPONIN I (HIGH SENSITIVITY)
Troponin I (High Sensitivity): 4 ng/L (ref <18)
Troponin I (High Sensitivity): 4 ng/L (ref <18)

## 2023-09-06 LAB — HCG, SERUM, QUALITATIVE: Preg, Serum: NEGATIVE

## 2023-09-06 LAB — D-DIMER, QUANTITATIVE: D-Dimer, Quant: 2.01 ug{FEU}/mL — ABNORMAL HIGH (ref 0.00–0.50)

## 2023-09-06 MED ORDER — IOHEXOL 350 MG/ML SOLN
75.0000 mL | Freq: Once | INTRAVENOUS | Status: AC | PRN
  Administered 2023-09-06: 75 mL via INTRAVENOUS

## 2023-09-06 NOTE — BHH Group Notes (Signed)
Spiritual care group on grief and loss facilitated by Chaplain Dyanne Carrel, [facility]  Group Goal: Support / Education around grief and loss  Members engage in facilitated group support and psycho-social education.  Group Description:  Following introductions and group rules, group members engaged in facilitated group dialogue and support around topic of loss, with particular support around experiences of loss in their lives. Group Identified types of loss (relationships / self / things) and identified patterns, circumstances, and changes that precipitate losses. Reflected on thoughts / feelings around loss, normalized grief responses, and recognized variety in grief experience. Group encouraged individual reflection on safe space and on the coping skills that they are already utilizing.  Group drew on Adlerian / Rogerian and narrative framework  Patient Progress: Melissa [name] attended group and actively engaged and participated ing roup conversation and activities.

## 2023-09-06 NOTE — ED Provider Triage Note (Signed)
Emergency Medicine Provider Triage Evaluation Note  Melissa [name] , a 23 y.o. female  was evaluated in triage.  Pt complains of  cp. Onset this evening. No sob.  Review of Systems  Positive: cp Negative: vomiting  Physical Exam  BP 123/78 (BP Location: Right Arm)   Pulse (!) 106   Temp 98.9 F (37.2 C)   Resp 20   SpO2 100%  Gen:   Awake, no distress   Resp:  Normal effort  MSK:   Moves extremities without difficulty  Other:    Medical Decision Making  Medically screening exam initiated at 9:29 PM.  Appropriate orders placed.  Melissa [name] was informed that the remainder of the evaluation will be completed by another provider, this initial triage assessment does not replace that evaluation, and the importance of remaining in the ED until their evaluation is complete.     Arthor Captain, PA-C 09/06/23 2130

## 2023-09-06 NOTE — ED Notes (Addendum)
Unable to access peripheral IV despite multiple attempts by RNs at triage . IV team consult ordered.

## 2023-09-06 NOTE — ED Triage Notes (Signed)
Pt was in a store, had a sudden onset of chest pain, describes it as stabbing chest pain, no cardiac Hx. She also reports a headache on the right side of he rhead. She is ambulatory and has no other complaints at this time.   Medications   324mg  asa given   Medic vitals  118/74 97%ra 17rr 106hr 115bgl

## 2023-09-07 ENCOUNTER — Other Ambulatory Visit: Payer: Self-pay

## 2023-09-07 NOTE — Discharge Instructions (Addendum)
It was a pleasure taking care of you here in the emergency department today  Your workup today was reassuring  I would make sure to follow-up outpatient, return for any worsening symptoms

## 2023-09-07 NOTE — ED Provider Notes (Signed)
[place] EMERGENCY DEPARTMENT AT [facility] Provider Note   CSN: [id number] Arrival date & time: 09/06/23  2104     History  Chief Complaint  Patient presents with   Chest Pain    Zandaya Fournet Kellam-Wallace is a 23 y.o. female here for evaluation of chest pain.  Patient states pain started earlier today.  No shortness of breath.  Does not radiate.  No associated diaphoresis.  No back pain, arm pain, jaw pain.  Able to complete her ADLs without any shortness of breath or pain.  No swelling, edema to legs.  No history of PE or DVT.  No recent URI symptoms.  HPI     Home Medications Prior to Admission medications   Medication Sig Start Date End Date Taking? Authorizing Provider  albuterol (VENTOLIN HFA) 108 (90 Base) MCG/ACT inhaler Inhale 1-2 puffs into the lungs every 4 (four) hours as needed for wheezing or shortness of breath. 09/04/23   Starleen Blue, NP  busPIRone (BUSPAR) 10 MG tablet Take 1 tablet (10 mg total) by mouth daily. 09/04/23   Starleen Blue, NP  busPIRone (BUSPAR) 10 MG tablet Take 2 tablets (20 mg total) by mouth at bedtime. 09/04/23   Starleen Blue, NP  hydrOXYzine (ATARAX) 25 MG tablet Take 1 tablet (25 mg total) by mouth 3 (three) times daily as needed for anxiety. 09/04/23   Starleen Blue, NP  nicotine (NICODERM CQ - DOSED IN MG/24 HOURS) 21 mg/24hr patch Place 1 patch (21 mg total) onto the skin daily at 6 (six) AM. 07/14/23   McLauchlin, Marylene Land, NP  risperiDONE (RISPERDAL) 2 MG tablet Take 1 tablet (2 mg total) by mouth at bedtime. 09/04/23   Starleen Blue, NP  sertraline (ZOLOFT) 50 MG tablet Take 1 tablet (50 mg total) by mouth daily. 09/04/23   Starleen Blue, NP  sulfamethoxazole-trimethoprim (BACTRIM DS) 800-160 MG tablet Take 1 tablet by mouth every 12 (twelve) hours. 09/04/23   Starleen Blue, NP  traZODone (DESYREL) 50 MG tablet Take 1 tablet (50 mg total) by mouth at bedtime as needed for sleep. 09/04/23   Starleen Blue, NP  valACYclovir  (VALTREX) 500 MG tablet Take 1 tablet (500 mg total) by mouth 2 (two) times daily. 09/04/23   Starleen Blue, NP  Vitamin D, Ergocalciferol, (DRISDOL) 1.25 MG (50000 UNIT) CAPS capsule Take 1 capsule (50,000 Units total) by mouth every 7 (seven) days. 09/06/23   Starleen Blue, NP  rizatriptan (MAXALT-MLT) 10 MG disintegrating tablet Take 1 tablet at onset of migraine with 2 ibuprofen may repeat an additional tablet in 2 hours if needed 05/07/20 06/09/20  Deetta Perla, MD      Allergies    Benadryl [diphenhydramine hcl], Haldol [haloperidol], Latex, Shellfish allergy, Zithromax [azithromycin], Camphor, Gluten meal, and Lactose intolerance (gi)    Review of Systems   Review of Systems  Constitutional: Negative.   HENT: Negative.    Respiratory: Negative.    Cardiovascular:  Positive for chest pain.  Gastrointestinal: Negative.   Genitourinary: Negative.   Musculoskeletal: Negative.   Skin: Negative.   Neurological: Negative.   All other systems reviewed and are negative.   Physical Exam Updated Vital Signs BP 123/78 (BP Location: Right Arm)   Pulse (!) 106   Temp 98.9 F (37.2 C)   Resp 20   SpO2 100%  Physical Exam Vitals and nursing note reviewed.  Constitutional:      General: She is not in acute distress.    Appearance: She is well-developed. She  is not ill-appearing.  HENT:     Head: Atraumatic.  Eyes:     Pupils: Pupils are equal, round, and reactive to light.  Cardiovascular:     Rate and Rhythm: Normal rate.     Pulses:          Radial pulses are 2+ on the right side and 2+ on the left side.       Dorsalis pedis pulses are 2+ on the right side and 2+ on the left side.     Heart sounds: Normal heart sounds.  Pulmonary:     Effort: Pulmonary effort is normal. No respiratory distress.     Breath sounds: Normal breath sounds.  Chest:     Chest wall: Tenderness present.  Abdominal:     General: Bowel sounds are normal. There is no distension or abdominal  bruit.     Palpations: Abdomen is soft. There is no fluid wave or mass.     Tenderness: There is no abdominal tenderness. There is no guarding or rebound.  Musculoskeletal:        General: Normal range of motion.     Cervical back: Normal range of motion.     Right lower leg: No tenderness. No edema.     Left lower leg: No tenderness. No edema.  Skin:    General: Skin is warm and dry.     Capillary Refill: Capillary refill takes less than 2 seconds.  Neurological:     General: No focal deficit present.     Mental Status: She is alert.  Psychiatric:        Mood and Affect: Mood normal.     ED Results / Procedures / Treatments   Labs (all labs ordered are listed, but only abnormal results are displayed) Labs Reviewed  BASIC METABOLIC PANEL - Abnormal; Notable for the following components:      Result Value   Sodium 134 (*)    Potassium 3.1 (*)    CO2 21 (*)    All other components within normal limits  CBC - Abnormal; Notable for the following components:   MCV 78.9 (*)    All other components within normal limits  D-DIMER, QUANTITATIVE - Abnormal; Notable for the following components:   D-Dimer, Quant 2.01 (*)    All other components within normal limits  HCG, SERUM, QUALITATIVE  TROPONIN I (HIGH SENSITIVITY)  TROPONIN I (HIGH SENSITIVITY)    EKG None  Radiology CT Angio Chest PE W and/or Wo Contrast Result Date: 09/07/2023 CLINICAL DATA:  Chest pain positive D-dimer EXAM: CT ANGIOGRAPHY CHEST WITH CONTRAST TECHNIQUE: Multidetector CT imaging of the chest was performed using the standard protocol during bolus administration of intravenous contrast. Multiplanar CT image reconstructions and MIPs were obtained to evaluate the vascular anatomy. RADIATION DOSE REDUCTION: This exam was performed according to the departmental dose-optimization program which includes automated exposure control, adjustment of the mA and/or kV according to patient size and/or use of iterative  reconstruction technique. CONTRAST:  75mL OMNIPAQUE IOHEXOL 350 MG/ML SOLN COMPARISON:  Chest x-ray 09/06/2023 FINDINGS: Cardiovascular: Satisfactory opacification of the pulmonary arteries to the segmental level. No evidence of pulmonary embolism. Normal heart size. No pericardial effusion. Nonaneurysmal aorta. No dissection Mediastinum/Nodes: No enlarged mediastinal, hilar, or axillary lymph nodes. Thyroid gland, trachea, and esophagus demonstrate no significant findings. Lungs/Pleura: Trace left pleural effusion. No acute airspace disease or pneumothorax Upper Abdomen: No acute abnormality. Musculoskeletal: No chest wall abnormality. No acute or significant osseous findings. Review of the  MIP images confirms the above findings. IMPRESSION: 1. Negative for acute pulmonary embolus or aortic dissection. 2. Trace left pleural effusion. Electronically Signed   By: Jasmine Pang M.D.   On: 09/07/2023 00:07   DG Chest 2 View Result Date: 09/06/2023 CLINICAL DATA:  Chest pain. EXAM: CHEST - 2 VIEW COMPARISON:  Chest radiograph 05/09/2023 FINDINGS: The heart size and mediastinal contours are within normal limits. Both lungs are clear. The visualized skeletal structures are unremarkable. IMPRESSION: No active cardiopulmonary disease. Electronically Signed   By: Annia Belt M.D.   On: 09/06/2023 21:32    Procedures Procedures    Medications Ordered in ED Medications  iohexol (OMNIPAQUE) 350 MG/ML injection 75 mL (75 mLs Intravenous Contrast Given 09/06/23 2349)   ED Course/ Medical Decision Making/ A&P   84 old here for evaluation of sudden onset chest pain.  Does not radiate.  No associated diaphoresis, nausea, vomiting, shortness of breath.  Able to complete her ADLs without exertional chest pain or shortness of breath.  No pain or swelling to lower extremities.  Cannot PERC due to tachycardia 106 on arrival.  No recent URI symptoms.  Mild tenderness anterior chest wall without any known trauma.  No  crepitus.  Will plan on labs imaging and reassess  Labs and imaging ordered from triage personally viewed and interpreted:  EKG without ischemic changes CBC without leukocytosis metabolic panel sodium 134, potassium 3.1,  D-dimer 2.01 Preg neg Chest xray without acute abnormality CTA chest without PE, infection, pneumothorax, pneumomediastinum  Patient reassessed, discussed labs and imaging.  Her pain is reproducible on exam.  Discussed OTC medications to help.  At this time low suspicion for acute ACS, PE, dissection, endocarditis, myocarditis, pericarditis, rib fracture, unstable angina, PNA.  The patient has been appropriately medically screened and/or stabilized in the ED. I have low suspicion for any other emergent medical condition which would require further screening, evaluation or treatment in the ED or require inpatient management.  Patient is hemodynamically stable and in no acute distress.  Patient able to ambulate in department prior to ED.  Evaluation does not show acute pathology that would require ongoing or additional emergent interventions while in the emergency department or further inpatient treatment.  I have discussed the diagnosis with the patient and answered all questions.  Pain is been managed while in the emergency department and patient has no further complaints prior to discharge.  Patient is comfortable with plan discussed in room and is stable for discharge at this time.  I have discussed strict return precautions for returning to the emergency department.  Patient was encouraged to follow-up with PCP/specialist refer to at discharge.                                Medical Decision Making Amount and/or Complexity of Data Reviewed Independent Historian: EMS External Data Reviewed: labs, radiology, ECG and notes. Labs: ordered. Decision-making details documented in ED Course. Radiology: ordered and independent interpretation performed. Decision-making details  documented in ED Course. ECG/medicine tests: ordered and independent interpretation performed. Decision-making details documented in ED Course.  Risk OTC drugs. Decision regarding hospitalization. Diagnosis or treatment significantly limited by social determinants of health.          Final Clinical Impression(s) / ED Diagnoses Final diagnoses:  Precordial pain    Rx / DC Orders ED Discharge Orders     None  Khylen Riolo A, PA-C 09/07/23 0226    Sabas Sous, MD 09/07/23 [id number]

## 2023-09-24 ENCOUNTER — Encounter (HOSPITAL_COMMUNITY): Payer: Self-pay | Admitting: *Deleted

## 2023-09-24 ENCOUNTER — Ambulatory Visit (HOSPITAL_COMMUNITY)
Admission: EM | Admit: 2023-09-24 | Discharge: 2023-09-24 | Disposition: A | Discharge status: Home / Self Care | Payer: 59 | Attending: Physician Assistant | Admitting: Physician Assistant

## 2023-09-24 DIAGNOSIS — J069 Acute upper respiratory infection, unspecified: Secondary | ICD-10-CM

## 2023-09-24 LAB — POCT RAPID STREP A (OFFICE): Rapid Strep A Screen: NEGATIVE

## 2023-09-24 NOTE — ED Provider Notes (Signed)
[facility]    CSN: [id number] Arrival date & time: 09/24/23  4098      History   Chief Complaint Chief Complaint  Patient presents with   Nasal Congestion   Sore Throat    HPI Melissa [name] is a 23 y.o. female.   HPI  Patient is here for concerns for sore throat and nasal congestion for about a week  She denies taking home medications for symptoms   She reports fever, chills, mildly productive coughing, body aches  Tmax 100.0 yesterday   She reports her sister was sick recently as well     Past Medical History:  Diagnosis Date   Anemia    Anxiety    Asthma    inhaler. 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   Seizures ([name])    last one in 2015 - on meds   Sickle cell trait ([name])    TBI (traumatic brain injury) ([name]) 2006    Patient Active Problem List   Diagnosis Date Noted   Schizoaffective disorder, bipolar type ([name]) 08/29/2023   Alcohol use disorder 08/29/2023   Tobacco use 08/29/2023   Schizophrenia ([name]) 08/28/2023   Major depressive disorder, single episode, severe with psychotic features ([name]) 08/28/2023   MDD (major depressive disorder), recurrent severe, 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   Homelessness 03/29/2023   OCD (obsessive compulsive disorder) 11/23/2022   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   Delivery by emergency cesarean 06/05/2022   Umbilical cord prolapse  06/05/2022   Vaginal bleeding during pregnancy 05/28/2022   Gonorrhea affecting pregnancy in second trimester 05/18/2022   ?fetal pericardial effusion 05/18/2022   [redacted] weeks gestation of pregnancy    Preterm premature rupture of membranes (PPROM) with unknown onset of labor 05/17/2022   Oligohydramnios antepartum 05/11/2022   Abnormal antenatal AFP screen 04/12/2022   History of cesarean section 04/07/2022   Bartholin cyst 04/07/2022   History of herpes genitalis 04/07/2022   Supervision of high risk pregnancy, antepartum 02/08/2022   Trichomonal vaginitis during pregnancy in second trimester 02/01/2022   History of postpartum depression 11/16/2021   Abnormal involuntary movements 08/04/2020   Migraine with aura and without status migrainosus, not intractable 08/12/2019   Seizures ([name])    Sickle cell trait ([name]) 02/13/2019   Rh negative state in antepartum period 02/05/2019   Short interval between pregnancies affecting pregnancy in second trimester, antepartum 02/05/2019   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    Past Surgical History:  Procedure Laterality Date   APPENDECTOMY     CESAREAN SECTION N/A 08/31/2021   Procedure: CESAREAN SECTION;  Surgeon: Kathrynn Running, MD;  Location: [facility];  Service: Obstetrics;  Laterality: N/A;   CESAREAN SECTION N/A 06/05/2022   Procedure: CESAREAN SECTION;  Surgeon: Hermina Staggers, MD;  Location: [facility];  Service: Obstetrics;  Laterality: N/A;   HERNIA REPAIR     INTESTINAL MALROTATION REPAIR  2001    OB History     Gravida  4   Para  3   Term  2   Preterm  1   AB  1   Living  3      SAB  1   IAB       Ectopic      Multiple  0   Live Births  3            Home Medications    Prior to Admission medications   Medication Sig Start Date End Date Taking? Authorizing Provider  busPIRone (BUSPAR) 10 MG tablet Take 2 tablets (20 mg total) by mouth at bedtime. 09/04/23  Yes Starleen Blue, NP  hydrOXYzine (ATARAX) 25 MG tablet Take 1 tablet (25 mg total) by mouth 3 (three) times daily as needed for anxiety. 09/04/23  Yes Starleen Blue, NP  risperiDONE (RISPERDAL) 2 MG tablet Take 1 tablet (2 mg total) by mouth at bedtime. 09/04/23  Yes Starleen Blue, NP  sertraline (ZOLOFT) 50 MG tablet Take 1 tablet (50 mg total) by mouth daily. 09/04/23  Yes Starleen Blue, NP  traZODone (DESYREL) 50 MG tablet Take 1 tablet (50 mg total) by mouth at bedtime as needed for sleep. 09/04/23  Yes Starleen Blue, NP  valACYclovir (VALTREX) 500 MG tablet Take 1 tablet (500 mg total) by mouth 2 (two) times daily. 09/04/23  Yes Nkwenti, Tyler Aas, NP  Vitamin D, Ergocalciferol, (DRISDOL) 1.25 MG (50000 UNIT) CAPS capsule Take 1 capsule (50,000 Units total) by mouth every 7 (seven) days. 09/06/23  Yes Starleen Blue, NP  albuterol (VENTOLIN HFA) 108 (90 Base) MCG/ACT inhaler Inhale 1-2 puffs into the lungs every 4 (four) hours as needed for wheezing or shortness of breath. 09/04/23   Starleen Blue, NP  busPIRone (BUSPAR) 10 MG tablet Take 1 tablet (10 mg total) by mouth daily. 09/04/23   Starleen Blue, NP  nicotine (NICODERM CQ - DOSED IN MG/24 HOURS) 21 mg/24hr patch Place 1 patch (21 mg total) onto the skin daily at 6 (six) AM. 07/14/23   McLauchlin, Marylene Land, NP  sulfamethoxazole-trimethoprim (BACTRIM DS) 800-160 MG tablet Take 1 tablet by mouth every 12 (twelve) hours. 09/04/23   Starleen Blue, NP  rizatriptan (MAXALT-MLT) 10 MG disintegrating tablet Take 1 tablet at onset of migraine with 2 ibuprofen may repeat an additional tablet in 2 hours if needed 05/07/20 06/09/20  Deetta Perla, MD    Family History Family  History  Problem Relation Age of Onset   Depression Mother    Stroke Mother    Obesity Mother    Post-traumatic stress disorder Mother    Anxiety disorder Mother    Hypertension Mother    Diabetes Mother    Schizophrenia Father    Kidney disease Father     Social History Social History   Tobacco Use   Smoking status: Every Day    Current packs/day: 0.25    Types: Cigarettes   Smokeless tobacco: Never   Tobacco comments:    black and milds, 2 cigs daily  Vaping Use   Vaping status: Former  Substance Use Topics   Alcohol use: Not Currently    Comment: occasionally, prior to pregnancy   Drug use: Not Currently  Types: Marijuana    Comment: last 2020     Allergies   Benadryl [diphenhydramine hcl], Haldol [haloperidol], Latex, Shellfish allergy, Zithromax [azithromycin], Camphor, Gluten meal, and Lactose intolerance (gi)   Review of Systems Review of Systems  Constitutional:  Positive for appetite change, chills and fever.  HENT:  Positive for congestion, ear pain and sore throat.   Gastrointestinal:  Positive for nausea. Negative for diarrhea and vomiting.  Musculoskeletal:  Positive for myalgias.  Neurological:  Positive for headaches. Negative for dizziness.     Physical Exam Triage Vital Signs ED Triage Vitals  Encounter Vitals Group     BP 09/24/23 1004 105/77     Systolic BP Percentile --      Diastolic BP Percentile --      Pulse Rate 09/24/23 1004 91     Resp 09/24/23 1004 18     Temp 09/24/23 1004 98.6 F (37 C)     Temp Source 09/24/23 1004 Oral     SpO2 09/24/23 1004 98 %     Weight --      Height --      Head Circumference --      Peak Flow --      Pain Score 09/24/23 1002 8     Pain Loc --      Pain Education --      Exclude from Growth Chart --    No data found.  Updated Vital Signs BP 105/77 (BP Location: Left Arm)   Pulse 91   Temp 98.6 F (37 C) (Oral)   Resp 18   LMP  (LMP Unknown)   SpO2 98%   Visual Acuity Right Eye  Distance:   Left Eye Distance:   Bilateral Distance:    Right Eye Near:   Left Eye Near:    Bilateral Near:     Physical Exam Vitals reviewed.  Constitutional:      General: She is awake.     Appearance: She is well-developed and well-groomed. She is ill-appearing and diaphoretic.  HENT:     Head: Normocephalic and atraumatic.     Right Ear: Hearing, tympanic membrane and ear canal normal.     Left Ear: Hearing, tympanic membrane and ear canal normal.     Mouth/Throat:     Lips: Pink.     Mouth: Mucous membranes are moist.     Pharynx: Oropharynx is clear. Uvula midline. No pharyngeal swelling, oropharyngeal exudate, posterior oropharyngeal erythema, uvula swelling or postnasal drip.  Cardiovascular:     Rate and Rhythm: Normal rate and regular rhythm.     Pulses: Normal pulses.          Radial pulses are 2+ on the right side and 2+ on the left side.     Heart sounds: Normal heart sounds. No murmur heard.    No friction rub. No gallop.  Pulmonary:     Effort: Pulmonary effort is normal.     Breath sounds: Normal breath sounds. No decreased air movement. No decreased breath sounds, wheezing, rhonchi or rales.  Musculoskeletal:     Cervical back: Normal range of motion and neck supple.  Lymphadenopathy:     Head:     Right side of head: No submental, submandibular or preauricular adenopathy.     Left side of head: No submental, submandibular or preauricular adenopathy.     Cervical:     Right cervical: No superficial cervical adenopathy.    Left cervical: No superficial cervical adenopathy.  Upper Body:     Right upper body: No supraclavicular adenopathy.     Left upper body: No supraclavicular adenopathy.  Neurological:     Mental Status: She is alert.  Psychiatric:        Behavior: Behavior is cooperative.      UC Treatments / Results  Labs (all labs ordered are listed, but only abnormal results are displayed) Labs Reviewed  POCT RAPID STREP A (OFFICE)     EKG   Radiology No results found.  Procedures Procedures (including critical care time)  Medications Ordered in UC Medications - No data to display  Initial Impression / Assessment and Plan / UC Course  I have reviewed the triage vital signs and the nursing notes.  Pertinent labs & imaging results that were available during my care of the patient were reviewed by me and considered in my medical decision making (see chart for details).      Final Clinical Impressions(s) / UC Diagnoses   Final diagnoses:  Upper respiratory tract infection, unspecified type   Acute, new concern Patient reports symptoms comprised of fever, sore throat, congestion.  She reports that this has been ongoing for about a week.  She has not taken anything over-the-counter to help with symptoms. Physical exam and HPI appear consistent with suspected viral URI. She is outside the window for flu and COVID antiviral medications.  POC testing for flu and COVID is not available today.  Testing would not change recommendations at this time. Recommend starting multisymptom medication such as DayQuil/NyQuil, TheraFlu, Alka-Seltzer for symptom management. If symptoms are worsening or not improving over the next 5 days recommend follow-up with PCP or return to clinic.  If symptoms significantly worsen to include shortness of breath, severe fever, confusion, fatigue recommend emergency room follow-up Follow-up as needed   Discharge Instructions       Your strep testing was negative.  At this time you are outside the window for COVID and flu antiviral medications.   Based on your described symptoms and the duration of symptoms it is likely that you have a viral upper respiratory infection (often called a "cold")  Symptoms can last for 3-10 days with lingering cough and intermittent symptoms lasting weeks after that.  The goal of treatment at this time is to reduce your symptoms and discomfort    You can  use over the counter medications such as Dayquil/Nyquil, AlkaSeltzer formulations, etc to provide further relief of symptoms according to the manufacturer's instructions  If preferred you can use Coricidin to manage your symptoms rather than those medications mentioned above.    If your symptoms do not improve or become worse in the next 5-7 days please make an apt at the office so we can see you  Go to the ER if you begin to have more serious symptoms such as shortness of breath, trouble breathing, loss of consciousness, swelling around the eyes, high fever, severe lasting headaches, vision changes or neck pain/stiffness.       ED Prescriptions   None    PDMP not reviewed this encounter.   Marquitta Persichetti, Oswaldo Conroy, PA-C 09/24/23 1120

## 2023-09-24 NOTE — Discharge Instructions (Addendum)
  Your strep testing was negative.  At this time you are outside the window for COVID and flu antiviral medications.   Based on your described symptoms and the duration of symptoms it is likely that you have a viral upper respiratory infection (often called a "cold")  Symptoms can last for 3-10 days with lingering cough and intermittent symptoms lasting weeks after that.  The goal of treatment at this time is to reduce your symptoms and discomfort    You can use over the counter medications such as Dayquil/Nyquil, AlkaSeltzer formulations, etc to provide further relief of symptoms according to the manufacturer's instructions  If preferred you can use Coricidin to manage your symptoms rather than those medications mentioned above.    If your symptoms do not improve or become worse in the next 5-7 days please make an apt at the office so we can see you  Go to the ER if you begin to have more serious symptoms such as shortness of breath, trouble breathing, loss of consciousness, swelling around the eyes, high fever, severe lasting headaches, vision changes or neck pain/stiffness.

## 2023-09-24 NOTE — ED Triage Notes (Signed)
Pt states she has had sore throat, congestion X 1 week. She has not taken any meds for sx.

## 2023-11-01 ENCOUNTER — Ambulatory Visit (HOSPITAL_COMMUNITY)

## 2023-11-24 ENCOUNTER — Ambulatory Visit (HOSPITAL_COMMUNITY)

## 2024-01-01 NOTE — Progress Notes (Deleted)
 ANNUAL EXAM Patient name: Melissa [name] MRN [id number]  Date of birth: May 24, 2001 Chief Complaint:   No chief complaint on file.  History of Present Illness:   Melissa [name] is a 23 y.o. [phone] {race:25618} female being seen today for a routine annual exam.  Current complaints: ***  No LMP recorded. Patient has had an injection.   The pregnancy intention screening data noted above was reviewed. Potential methods of contraception were discussed. The patient elected to proceed with No data recorded.   Last pap 11/19/20 NILM. Results were: {Pap findings:25134}. H/O abnormal pap: {yes/yes***/no:23866} Last mammogram: ***. Results were: {normal, abnormal, n/a:23837}. Family h/o breast cancer: {yes***/no:23838} Last colonoscopy: ***. Results were: {normal, abnormal, n/a:23837}. Family h/o colorectal cancer: {yes***/no:23838}     03/14/2023    9:21 AM 01/24/2023    8:32 AM 04/07/2022   10:55 AM 02/08/2022    1:49 PM 12/15/2021    9:34 AM  Depression screen PHQ 2/9  Decreased Interest   1 2   Down, Depressed, Hopeless   1 2   PHQ - 2 Score   2 4   Altered sleeping   0 2   Tired, decreased energy   1 1   Change in appetite   0 0   Feeling bad or failure about yourself    0 0   Trouble concentrating   1 1   Moving slowly or fidgety/restless   1 1   Suicidal thoughts   0 0   PHQ-9 Score   5 9   Difficult doing work/chores          Information is confidential and restricted. Go to Review Flowsheets to unlock data.        03/14/2023    9:23 AM 01/24/2023    8:34 AM 02/08/2022    1:50 PM 12/15/2021    9:35 AM  GAD 7 : Generalized Anxiety Score  Nervous, Anxious, on Edge   1   Control/stop worrying   1   Worry too much - different things   1   Trouble relaxing   1   Restless   1   Easily annoyed or irritable   1   Afraid - awful might happen   2   Total GAD 7 Score   8   Anxiety Difficulty         Information is confidential and restricted. Go to Review Flowsheets  to unlock data.     Review of Systems:   Pertinent items are noted in HPI Denies any headaches, blurred vision, fatigue, shortness of breath, chest pain, abdominal pain, abnormal vaginal discharge/itching/odor/irritation, problems with periods, bowel movements, urination, or intercourse unless otherwise stated above. Pertinent History Reviewed:  Reviewed past medical,surgical, social and family history.  Reviewed problem list, medications and allergies. Physical Assessment:  There were no vitals filed for this visit.There is no height or weight on file to calculate BMI.        Physical Examination:   General appearance - well appearing, and in no distress  Mental status - alert, oriented to person, place, and time  Psych:  She has a normal mood and affect  Skin - warm and dry, normal color, no suspicious lesions noted  Chest - effort normal, all lung fields clear to auscultation bilaterally  Heart - normal rate and regular rhythm  Neck:  midline trachea, no thyromegaly or nodules  Breasts - breasts appear normal, no suspicious masses, no skin or nipple changes or  axillary nodes  Abdomen - soft, nontender, nondistended, no masses or organomegaly  Pelvic - VULVA: normal appearing vulva with no masses, tenderness or lesions  VAGINA: normal appearing vagina with normal color and discharge, no lesions  CERVIX: normal appearing cervix without discharge or lesions, no CMT  Thin prep pap is {Desc; done/not:10129} *** HR HPV cotesting  UTERUS: uterus is felt to be normal size, shape, consistency and nontender   ADNEXA: No adnexal masses or tenderness noted.  Extremities:  No swelling or varicosities noted  Chaperone present for exam  No results found for this or any previous visit (from the past 24 hours).  Assessment & Plan:  - Cervical cancer screening: Discussed screening Q3 years. Reviewed importance of annual exams and limits of pap smear. Pap with reflex HPV *** - GC/CT: Discussed and  recommended. Pt  {Blank single:19197::"accepts","declines"} - Gardasil: {Blank single:19197::"***","has not yet had. Will provide information","completed","has not yet had. Counseling provided and she declines","Has not yet had. Counseling provided and pt accepts"} - Birth Control: {Birth control type:23956} - Breast Health: Encouraged self breast awareness/exams. Teaching provided. - Follow-up: 12 months and prn   Mammogram: {Mammo f/u:25212::"@ 24yo"}, or sooner if problems Colonoscopy: {TCS f/u:25213::"@ 24yo"}, or sooner if problems  No orders of the defined types were placed in this encounter.   Meds: No orders of the defined types were placed in this encounter.   Follow-up: No follow-ups on file.  Acen Craun E Piccola [place], PA-C 01/01/2024 7:54 AM

## 2024-01-03 ENCOUNTER — Ambulatory Visit: Admitting: Physician Assistant

## 2024-01-03 DIAGNOSIS — Z124 Encounter for screening for malignant neoplasm of cervix: Secondary | ICD-10-CM

## 2024-01-03 DIAGNOSIS — Z8619 Personal history of other infectious and parasitic diseases: Secondary | ICD-10-CM

## 2024-01-03 DIAGNOSIS — Z01419 Encounter for gynecological examination (general) (routine) without abnormal findings: Secondary | ICD-10-CM

## 2024-01-30 ENCOUNTER — Telehealth (HOSPITAL_COMMUNITY): Payer: Self-pay | Admitting: Physician Assistant

## 2024-01-30 ENCOUNTER — Other Ambulatory Visit (HOSPITAL_COMMUNITY): Payer: Self-pay | Admitting: Physician Assistant

## 2024-01-30 ENCOUNTER — Encounter (HOSPITAL_COMMUNITY): Payer: Self-pay

## 2024-01-30 ENCOUNTER — Ambulatory Visit (INDEPENDENT_AMBULATORY_CARE_PROVIDER_SITE_OTHER): Payer: MEDICAID | Admitting: Clinical

## 2024-01-30 DIAGNOSIS — F250 Schizoaffective disorder, bipolar type: Secondary | ICD-10-CM

## 2024-01-30 DIAGNOSIS — F209 Schizophrenia, unspecified: Secondary | ICD-10-CM | POA: Diagnosis not present

## 2024-01-30 DIAGNOSIS — F411 Generalized anxiety disorder: Secondary | ICD-10-CM

## 2024-01-30 MED ORDER — BUSPIRONE HCL 10 MG PO TABS
20.0000 mg | ORAL_TABLET | Freq: Every day | ORAL | 1 refills | Status: AC
Start: 2024-01-30 — End: ?

## 2024-01-30 MED ORDER — HYDROXYZINE HCL 25 MG PO TABS: 25.0000 mg | ORAL_TABLET | Freq: Three times a day (TID) | ORAL | 1 refills | Status: AC | PRN

## 2024-01-30 MED ORDER — RISPERIDONE 2 MG PO TABS
2.0000 mg | ORAL_TABLET | Freq: Every day | ORAL | 1 refills | Status: AC
Start: 2024-01-30 — End: ?

## 2024-01-30 MED ORDER — TRAZODONE HCL 50 MG PO TABS: 50.0000 mg | ORAL_TABLET | Freq: Every evening | ORAL | 1 refills | Status: AC | PRN

## 2024-01-30 MED ORDER — SERTRALINE HCL 50 MG PO TABS
50.0000 mg | ORAL_TABLET | Freq: Every day | ORAL | 1 refills | Status: AC
Start: 2024-01-30 — End: ?

## 2024-01-30 MED ORDER — BUSPIRONE HCL 10 MG PO TABS: 10.0000 mg | ORAL_TABLET | Freq: Every day | ORAL | 1 refills | Status: AC

## 2024-01-30 NOTE — Telephone Encounter (Signed)
 Patient is requesting bridge until her next appt. Please send Bridge to pharmacy on file her next appt is 03/05/24 @ 3:30. Thanks
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
## 2024-01-30 NOTE — Progress Notes (Signed)
 Provider was contacted by Coit Dasen regarding patient's request for bridge on her medications until her next appointment. Patient's medication to be e-prescribed to pharmacy of choice.

## 2024-02-15 ENCOUNTER — Emergency Department (HOSPITAL_COMMUNITY)
Admission: EM | Admit: 2024-02-15 | Discharge: 2024-02-15 | Disposition: A | Discharge status: Home / Self Care | Payer: MEDICAID | Attending: Emergency Medicine | Admitting: Emergency Medicine

## 2024-02-15 DIAGNOSIS — B349 Viral infection, unspecified: Secondary | ICD-10-CM | POA: Insufficient documentation

## 2024-02-15 DIAGNOSIS — R519 Headache, unspecified: Secondary | ICD-10-CM

## 2024-02-15 DIAGNOSIS — Z91040 Latex allergy status: Secondary | ICD-10-CM | POA: Diagnosis not present

## 2024-02-15 LAB — RESP PANEL BY RT-PCR (RSV, FLU A&B, COVID)  RVPGX2
Influenza A by PCR: NEGATIVE
Influenza B by PCR: NEGATIVE
Resp Syncytial Virus by PCR: NEGATIVE
SARS Coronavirus 2 by RT PCR: NEGATIVE

## 2024-02-15 LAB — PREGNANCY, URINE: Preg Test, Ur: NEGATIVE

## 2024-02-15 MED ORDER — IBUPROFEN 400 MG PO TABS
600.0000 mg | ORAL_TABLET | Freq: Once | ORAL | Status: AC
  Administered 2024-02-15: 600 mg via ORAL
  Filled 2024-02-15: qty 1

## 2024-02-15 NOTE — Discharge Instructions (Addendum)
 You were seen in the emerged department for headache body aches Your symptoms improved after he took some Tylenol  and we gave you a dose of Motrin  here Your pregnancy test was negative Your COVID influenza RSV test results are all negative Your symptoms may be due to a lack of sleep as you are working late or an early viral illness Continue taking Tylenol  and/or Motrin  as directed for pain discomfort Continue taking all previous prescribed medications Follow-up with your primary care doctor in 1 week for reevaluation Return to the emergency department for severe headaches or any other concerns

## 2024-02-15 NOTE — ED Triage Notes (Signed)
 Patient arrives via [facility]. Patient reports general body aches, headaches, nausea, congestion and general weakness x6 hours. Patient took tylenol  3 hours ago with some relief.

## 2024-02-15 NOTE — ED Provider Notes (Addendum)
 [place] EMERGENCY DEPARTMENT AT [facility] Provider Note   CSN: [id number] Arrival date & time: 02/15/24  [id number]     Patient presents with: Generalized Body Aches   Melissa [name] is a 23 y.o. female.  With a history of migraines, anemia and schizophrenia who presents to the ED for general malaise.  Patient worked until 0400 this morning.  After work she began to have headaches with floaters in her vision along with nausea and bodyaches.  She did take Tylenol  prior to arrival that helped with her symptoms and she was able to fall asleep here.  No fevers, chills chest pain shortness of breath nausea vomiting abdominal pain.   HPI     Prior to Admission medications   Medication Sig Start Date End Date Taking? Authorizing Provider  albuterol  (VENTOLIN  HFA) 108 (90 Base) MCG/ACT inhaler Inhale 1-2 puffs into the lungs every 4 (four) hours as needed for wheezing or shortness of breath. 09/04/23   Tex Drilling, NP  busPIRone  (BUSPAR ) 10 MG tablet Take 1 tablet (10 mg total) by mouth daily. 01/30/24   Nwoko, Uchenna E, PA  busPIRone  (BUSPAR ) 10 MG tablet Take 2 tablets (20 mg total) by mouth at bedtime. 01/30/24   Nwoko, Uchenna E, PA  hydrOXYzine  (ATARAX ) 25 MG tablet Take 1 tablet (25 mg total) by mouth 3 (three) times daily as needed for anxiety. 01/30/24   Nwoko, Uchenna E, PA  nicotine  (NICODERM CQ  - DOSED IN MG/24 HOURS) 21 mg/24hr patch Place 1 patch (21 mg total) onto the skin daily at 6 (six) AM. 07/14/23   McLauchlin, Jon, NP  risperiDONE  (RISPERDAL ) 2 MG tablet Take 1 tablet (2 mg total) by mouth at bedtime. 01/30/24   Nwoko, Uchenna E, PA  sertraline  (ZOLOFT ) 50 MG tablet Take 1 tablet (50 mg total) by mouth daily. 01/30/24   Nwoko, Uchenna E, PA  sulfamethoxazole -trimethoprim  (BACTRIM  DS) 800-160 MG tablet Take 1 tablet by mouth every 12 (twelve) hours. 09/04/23   Tex Drilling, NP  traZODone  (DESYREL ) 50 MG tablet Take 1 tablet (50 mg total) by mouth at bedtime  as needed for sleep. 01/30/24   Nwoko, Uchenna E, PA  valACYclovir  (VALTREX ) 500 MG tablet Take 1 tablet (500 mg total) by mouth 2 (two) times daily. 09/04/23   Tex Drilling, NP  Vitamin D , Ergocalciferol , (DRISDOL ) 1.25 MG (50000 UNIT) CAPS capsule Take 1 capsule (50,000 Units total) by mouth every 7 (seven) days. 09/06/23   Tex Drilling, NP  rizatriptan  (MAXALT -MLT) 10 MG disintegrating tablet Take 1 tablet at onset of migraine with 2 ibuprofen  may repeat an additional tablet in 2 hours if needed 05/07/20 06/09/20  Susen Elsie DEL, MD    Allergies: Benadryl  [diphenhydramine  hcl], Haldol  [haloperidol ], Latex, Shellfish allergy, Zithromax [azithromycin], Camphor, Gluten meal, and Lactose intolerance (gi)    Review of Systems  Updated Vital Signs BP 118/70   Pulse 98   Temp 97.8 F (36.6 C)   Resp 18   SpO2 100%   Physical Exam Vitals and nursing note reviewed.  HENT:     Head: Normocephalic and atraumatic.   Eyes:     Pupils: Pupils are equal, round, and reactive to light.    Cardiovascular:     Rate and Rhythm: Normal rate and regular rhythm.  Pulmonary:     Effort: Pulmonary effort is normal.     Breath sounds: Normal breath sounds.  Abdominal:     Palpations: Abdomen is soft.     Tenderness: There is  no abdominal tenderness.   Skin:    General: Skin is warm and dry.   Neurological:     Mental Status: She is alert.   Psychiatric:        Mood and Affect: Mood normal.     (all labs ordered are listed, but only abnormal results are displayed) Labs Reviewed  RESP PANEL BY RT-PCR (RSV, FLU A&B, COVID)  [id number]  PREGNANCY, URINE    EKG: None  Radiology: No results found.   Procedures   Medications Ordered in the ED  ibuprofen  (ADVIL ) tablet 600 mg (600 mg Oral Given 02/15/24 0828)    Clinical Course as of 02/15/24 0922  Fri Feb 15, 2024  0922 Pregnancy negative.  COVID RSV influenza all negative.  Patient reports improvement in symptoms.  Suspect this  is migraine headache or fatigue from lack of sleep.  Return precaution discussed in detail [MP]    Clinical Course User Index [MP] Pamella Ozell LABOR, DO                                 Medical Decision Making 23 year old female with history as above presenting for headache body aches beginning this morning.  She describes what sounds like a migraine headache with visual floaters.  Pain improved after Tylenol .  She is able to sleep.[name]  Neurologically intact.  No other symptoms.  Will obtain pregnancy test to look for early pregnancy as a cause of her general malaise along with COVID/RSV/influenza to look for potential viral illness as cause of her symptoms.  Will give her ibuprofen  for symptomatic relief and continue to monitor here  Amount and/or Complexity of Data Reviewed Labs: ordered.        Final diagnoses:  Viral syndrome  Acute nonintractable headache, unspecified headache type    ED Discharge Orders     None          Pamella Ozell LABOR, DO 02/15/24 0917    Pamella Ozell LABOR, DO 02/15/24 [id number]

## 2024-03-05 ENCOUNTER — Encounter (HOSPITAL_COMMUNITY): Payer: MEDICAID | Admitting: Physician Assistant

## 2024-03-30 ENCOUNTER — Encounter (HOSPITAL_COMMUNITY): Payer: Self-pay | Admitting: *Deleted

## 2024-03-30 ENCOUNTER — Emergency Department (HOSPITAL_COMMUNITY)
Admission: EM | Admit: 2024-03-30 | Discharge: 2024-03-30 | Disposition: A | Discharge status: Home / Self Care | Payer: MEDICAID | Attending: Emergency Medicine | Admitting: Emergency Medicine

## 2024-03-30 ENCOUNTER — Other Ambulatory Visit: Payer: Self-pay

## 2024-03-30 DIAGNOSIS — R7303 Prediabetes: Secondary | ICD-10-CM | POA: Insufficient documentation

## 2024-03-30 DIAGNOSIS — R0981 Nasal congestion: Secondary | ICD-10-CM | POA: Diagnosis present

## 2024-03-30 DIAGNOSIS — Z91040 Latex allergy status: Secondary | ICD-10-CM | POA: Insufficient documentation

## 2024-03-30 DIAGNOSIS — B349 Viral infection, unspecified: Secondary | ICD-10-CM | POA: Diagnosis not present

## 2024-03-30 LAB — RESP PANEL BY RT-PCR (RSV, FLU A&B, COVID)  RVPGX2
Influenza A by PCR: NEGATIVE
Influenza B by PCR: NEGATIVE
Resp Syncytial Virus by PCR: NEGATIVE
SARS Coronavirus 2 by RT PCR: NEGATIVE

## 2024-03-30 LAB — CBG MONITORING, ED: Glucose-Capillary: 181 mg/dL — ABNORMAL HIGH (ref 70–99)

## 2024-03-30 NOTE — Discharge Instructions (Signed)
 Tylenol  or motrin  as needed for body aches.  Make sure to hydrate well. Follow-up with your doctor. Return here for new concerns.

## 2024-03-30 NOTE — ED Provider Notes (Signed)
 [place] [facility] AT [facility] Provider Note   CSN: [id number] Arrival date & time: 03/30/24  9683     Patient presents with: Generalized Body Aches and Sore Throat   Melissa [name] is a 23 y.o. female.   The history is provided by the patient and medical records.  Sore Throat   23 year old female with history of anxiety, bipolar disorder, intellectual disability, OCD, presenting to the ED with URI type symptoms.  She reports nasal congestion, sore throat, and generally feeling unwell for the past few days.  Children and roommate have been sick with similar.  She denies any fever or chills.  No vomiting or diarrhea.  Requesting COVID/flu testing as she was concerned about exposing her children.    Also requested glucose check as she is prediabetic.    Prior to Admission medications   Medication Sig Start Date End Date Taking? Authorizing Provider  albuterol  (VENTOLIN  HFA) 108 (90 Base) MCG/ACT inhaler Inhale 1-2 puffs into the lungs every 4 (four) hours as needed for wheezing or shortness of breath. 09/04/23   Tex Drilling, NP  busPIRone  (BUSPAR ) 10 MG tablet Take 1 tablet (10 mg total) by mouth daily. 01/30/24   Nwoko, Uchenna E, PA  busPIRone  (BUSPAR ) 10 MG tablet Take 2 tablets (20 mg total) by mouth at bedtime. 01/30/24   Nwoko, Uchenna E, PA  hydrOXYzine  (ATARAX ) 25 MG tablet Take 1 tablet (25 mg total) by mouth 3 (three) times daily as needed for anxiety. 01/30/24   Nwoko, Uchenna E, PA  nicotine  (NICODERM CQ  - DOSED IN MG/24 HOURS) 21 mg/24hr patch Place 1 patch (21 mg total) onto the skin daily at 6 (six) AM. 07/14/23   McLauchlin, Jon, NP  risperiDONE  (RISPERDAL ) 2 MG tablet Take 1 tablet (2 mg total) by mouth at bedtime. 01/30/24   Nwoko, Uchenna E, PA  sertraline  (ZOLOFT ) 50 MG tablet Take 1 tablet (50 mg total) by mouth daily. 01/30/24   Nwoko, Uchenna E, PA  sulfamethoxazole -trimethoprim  (BACTRIM  DS) 800-160 MG tablet Take 1 tablet by  mouth every 12 (twelve) hours. 09/04/23   Tex Drilling, NP  traZODone  (DESYREL ) 50 MG tablet Take 1 tablet (50 mg total) by mouth at bedtime as needed for sleep. 01/30/24   Nwoko, Uchenna E, PA  valACYclovir  (VALTREX ) 500 MG tablet Take 1 tablet (500 mg total) by mouth 2 (two) times daily. 09/04/23   Tex Drilling, NP  Vitamin D , Ergocalciferol , (DRISDOL ) 1.25 MG (50000 UNIT) CAPS capsule Take 1 capsule (50,000 Units total) by mouth every 7 (seven) days. 09/06/23   Tex Drilling, NP  rizatriptan  (MAXALT -MLT) 10 MG disintegrating tablet Take 1 tablet at onset of migraine with 2 ibuprofen  may repeat an additional tablet in 2 hours if needed 05/07/20 06/09/20  Susen Elsie DEL, MD    Allergies: Benadryl  [diphenhydramine  hcl], Haldol  [haloperidol ], Latex, Shellfish allergy, Zithromax [azithromycin], Camphor, Gluten meal, and Lactose intolerance (gi)    Review of Systems  HENT:  Positive for congestion and sore throat.   All other systems reviewed and are negative.   Updated Vital Signs BP 123/87 (BP Location: Right Arm)   Pulse 85   Temp 98.3 F (36.8 C)   Resp 18   SpO2 96%   Physical Exam Vitals and nursing note reviewed.  Constitutional:      Appearance: She is well-developed.  HENT:     Head: Normocephalic and atraumatic.     Nose: Congestion present. No rhinorrhea.     Mouth/Throat:  Comments: Tonsils overall normal in appearance bilaterally without exudate; uvula midline without evidence of peritonsillar abscess; handling secretions appropriately; no difficulty swallowing or speaking; normal phonation without stridor Eyes:     Conjunctiva/sclera: Conjunctivae normal.     Pupils: Pupils are equal, round, and reactive to light.  Cardiovascular:     Rate and Rhythm: Normal rate and regular rhythm.     Heart sounds: Normal heart sounds.  Pulmonary:     Effort: Pulmonary effort is normal.     Breath sounds: Normal breath sounds.  Abdominal:     General: Bowel sounds are  normal.     Palpations: Abdomen is soft.  Musculoskeletal:        General: Normal range of motion.     Cervical back: Normal range of motion.  Skin:    General: Skin is warm and dry.  Neurological:     Mental Status: She is alert and oriented to person, place, and time.     (all labs ordered are listed, but only abnormal results are displayed) Labs Reviewed  CBG MONITORING, ED - Abnormal; Notable for the following components:      Result Value   Glucose-Capillary 181 (*)    All other components within normal limits  RESP PANEL BY RT-PCR (RSV, FLU A&B, COVID)  [id number]    EKG: None  Radiology: No results found.   Procedures   Medications Ordered in the ED - No data to display                                  Medical Decision Making  23 year old female presenting to the ED with URI type symptoms in the past few days.  Sick children and roommate as well.  She is afebrile, nontoxic in appearance here.  Had some nasal congestion but no tonsillar edema or exudates.  Hand excretions well, no stridor.  Lung sounds are clear.  RVP is negative.  Also requested glucose check as she is prediabetic, 181 but she did just eat peanut butter crackers while in the treatment room.  Feel she is stable for discharge with continued symptomatic care. Suspect likely viral etiology.  Follow-up with PCP.   Can return here for new concerns.  Final diagnoses:  Viral syndrome    ED Discharge Orders     None          Jarold Olam [name], PA-C 03/30/24 9547    Theadore Ozell [name], MD 03/30/24 0700

## 2024-03-30 NOTE — ED Triage Notes (Signed)
 Pt c/o sore throat and not feeling well over the past day or two. Children previously sick. Was having chest tightness and sob after walking to the store that has since resolved. Requesting covid/flu test

## 2024-04-14 ENCOUNTER — Other Ambulatory Visit: Payer: Self-pay | Admitting: Family Medicine

## 2024-05-01 ENCOUNTER — Telehealth: Payer: MEDICAID

## 2024-05-02 ENCOUNTER — Ambulatory Visit (HOSPITAL_COMMUNITY): Payer: MEDICAID

## 2024-05-03 ENCOUNTER — Ambulatory Visit (HOSPITAL_COMMUNITY)
Admission: EM | Admit: 2024-05-03 | Discharge: 2024-05-03 | Disposition: A | Discharge status: Home / Self Care | Payer: MEDICAID | Attending: Emergency Medicine | Admitting: Emergency Medicine

## 2024-05-03 ENCOUNTER — Encounter (HOSPITAL_COMMUNITY): Payer: Self-pay

## 2024-05-03 ENCOUNTER — Ambulatory Visit (INDEPENDENT_AMBULATORY_CARE_PROVIDER_SITE_OTHER): Payer: MEDICAID

## 2024-05-03 DIAGNOSIS — A6000 Herpesviral infection of urogenital system, unspecified: Secondary | ICD-10-CM | POA: Diagnosis present

## 2024-05-03 DIAGNOSIS — J4521 Mild intermittent asthma with (acute) exacerbation: Secondary | ICD-10-CM

## 2024-05-03 DIAGNOSIS — R0602 Shortness of breath: Secondary | ICD-10-CM

## 2024-05-03 DIAGNOSIS — Z3202 Encounter for pregnancy test, result negative: Secondary | ICD-10-CM | POA: Diagnosis not present

## 2024-05-03 DIAGNOSIS — N760 Acute vaginitis: Secondary | ICD-10-CM | POA: Diagnosis present

## 2024-05-03 LAB — HIV ANTIBODY (ROUTINE TESTING W REFLEX): HIV Screen 4th Generation wRfx: NONREACTIVE

## 2024-05-03 LAB — POCT URINE PREGNANCY: Preg Test, Ur: NEGATIVE

## 2024-05-03 MED ORDER — DOXYCYCLINE HYCLATE 100 MG PO TABS: 100.0000 mg | ORAL_TABLET | Freq: Two times a day (BID) | ORAL | 0 refills | Status: AC

## 2024-05-03 MED ORDER — PREDNISONE 20 MG PO TABS: 40.0000 mg | ORAL_TABLET | Freq: Every day | ORAL | 0 refills | Status: AC

## 2024-05-03 MED ORDER — CEFTRIAXONE SODIUM 500 MG IJ SOLR
INTRAMUSCULAR | Status: AC
  Filled 2024-05-03: qty 500

## 2024-05-03 MED ORDER — VALACYCLOVIR HCL 1 G PO TABS: 500.0000 mg | ORAL_TABLET | Freq: Two times a day (BID) | ORAL | 0 refills | Status: AC

## 2024-05-03 MED ORDER — FLUCONAZOLE 150 MG PO TABS
ORAL_TABLET | ORAL | 0 refills | Status: DC
Start: 2024-05-03 — End: 2024-07-11

## 2024-05-03 MED ORDER — CEFTRIAXONE SODIUM 1 G IJ SOLR
0.5000 g | Freq: Once | INTRAMUSCULAR | Status: AC
  Administered 2024-05-03: 0.5 g via INTRAMUSCULAR

## 2024-05-03 MED ORDER — LIDOCAINE HCL (PF) 1 % IJ SOLN
INTRAMUSCULAR | Status: AC
  Filled 2024-05-03: qty 2

## 2024-05-03 NOTE — ED Provider Notes (Signed)
 [facility]    CSN: [id number] Arrival date & time: 05/03/24  1125      History   Chief Complaint Chief Complaint  Patient presents with   Vaginal Itching   Shortness of Breath    HPI Melissa [name] is a 23 y.o. female.   Patient presents to clinic with multiple concerns.  She has been short of breath and wheezing for the past 2 weeks.  Reports reactive airway disease and asthma that is worse with activity.  Has been using inhaler throughout the day, last use this morning.  More short of breath when walking upstairs.  Has a mild cough, nonproductive.  Reports a history of fluid on her lungs and has had some chest soreness.  Is unsure of fever, has had hot and cold chills.  Reports an active herpes simplex outbreak in the genital area and would like a refill of her Valtrex .  Has had vaginal itching with a discharge for the past 5 days.  Reports she was post be treated for PID 4 to 5 months ago but she left prior to getting her Rocephin  injection and completing her treatment.  She is unsure if she ever tested positive for sexually transmitted infections from this encounter.  Since May she has been having abdominal pain and cramping.  Reports swabbing herself in clinic and having a brown/red discharge.  Reports pain with intercourse.  Also is having some dental pain, thinks this is from a wisdom tooth that is growing in.  Is working on following up with a Education officer, community.  Does have a primary care provider appointment scheduled for the 23rd of this month.  The history is provided by the patient and medical records.  Vaginal Itching  Shortness of Breath   Past Medical History:  Diagnosis Date   Anemia    Anxiety    Asthma    inhaler. last attack June 2019   Complication of anesthesia    Congenital hydronephrosis 2001   Constipation    Depression    Episodic tension-type headache, not intractable 04/16/2015   Family history of adverse reaction to anesthesia     mom rushed to hospital from Dentist office, pt her mother & grandmother have trouble waking fully after anesthesia   Genital herpes    Low back strain, sequela 11/10/2020   Migraine without aura and without status migrainosus, not intractable 04/16/2015   PID (pelvic inflammatory disease)    Post-partum depression 11/17/2021   Seizures ([name])    last one in 2015 - on meds   Sickle cell trait ([name])    TBI (traumatic brain injury) ([name]) 2006    Patient Active Problem List   Diagnosis Date Noted   Schizoaffective disorder, bipolar type ([name]) 08/29/2023   Alcohol use disorder 08/29/2023   Tobacco use 08/29/2023   Schizophrenia ([name]) 08/28/2023   Major depressive disorder, single episode, severe with psychotic features ([name]) 08/28/2023   MDD (major depressive disorder), recurrent severe, 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   Homelessness 03/29/2023   OCD (obsessive compulsive disorder) 11/23/2022   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   Delivery by emergency cesarean 06/05/2022   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   Vaginal bleeding during pregnancy 05/28/2022   Gonorrhea affecting pregnancy in second trimester 05/18/2022   ?fetal pericardial effusion 05/18/2022   [redacted] weeks gestation of pregnancy    Preterm premature rupture of membranes (PPROM) with unknown onset of labor 05/17/2022   Oligohydramnios antepartum 05/11/2022   Abnormal antenatal  AFP screen 04/12/2022   History of cesarean section 04/07/2022   Bartholin cyst 04/07/2022   History of herpes genitalis 04/07/2022   Supervision of high risk pregnancy, antepartum 02/08/2022   Trichomonal vaginitis during pregnancy in second trimester 02/01/2022   History of postpartum depression 11/16/2021   Abnormal involuntary movements 08/04/2020   Migraine with aura and without status migrainosus, not intractable 08/12/2019   Seizures ([name])    Sickle cell trait ([name]) 02/13/2019   Rh negative state in  antepartum period 02/05/2019   Short interval between pregnancies affecting pregnancy in second trimester, antepartum 02/05/2019   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    Past Surgical History:  Procedure Laterality Date   APPENDECTOMY     CESAREAN SECTION N/A 08/31/2021   Procedure: CESAREAN SECTION;  Surgeon: Kandis Devaughn Sayres, MD;  Location: [facility];  Service: Obstetrics;  Laterality: N/A;   CESAREAN SECTION N/A 06/05/2022   Procedure: CESAREAN SECTION;  Surgeon: Lorence Ozell CROME, MD;  Location: [facility];  Service: Obstetrics;  Laterality: N/A;   HERNIA REPAIR     INTESTINAL MALROTATION REPAIR  2001    OB History     Gravida  4   Para  3   Term  2   Preterm  1   AB  1   Living  3      SAB  1   IAB      Ectopic      Multiple  0   Live Births  3            Home Medications    Prior to Admission medications   Medication Sig Start Date End Date Taking? Authorizing Provider  doxycycline  (VIBRA -TABS) 100 MG tablet Take 1 tablet (100 mg total) by mouth 2 (two) times daily for 7 days. 05/03/24 05/10/24 Yes Larnce Schnackenberg  N, FNP  fluconazole  (DIFLUCAN ) 150 MG tablet As needed if vaginal itching develops. 05/03/24  Yes Takuma Cifelli  N, FNP  predniSONE  (DELTASONE ) 20 MG tablet Take 2 tablets (40 mg total) by mouth daily for 5 days. 05/03/24 05/08/24 Yes Braylin Formby  N, FNP  valACYclovir  (VALTREX ) 1000 MG tablet Take 0.5 tablets (500 mg total) by mouth 2 (two) times daily for 3 days. 05/03/24 05/06/24 Yes Tia Hieronymus  N, FNP  albuterol  (VENTOLIN  HFA) 108 (90 Base) MCG/ACT inhaler Inhale 1-2 puffs into the lungs every 4 (four) hours as  needed for wheezing or shortness of breath. 09/04/23   Tex Drilling, NP  busPIRone  (BUSPAR ) 10 MG tablet Take 1 tablet (10 mg total) by mouth daily. 01/30/24   Nwoko, Uchenna E, PA  busPIRone  (BUSPAR ) 10 MG tablet Take 2 tablets (20 mg total) by mouth at bedtime. 01/30/24   Nwoko, Uchenna E, PA  hydrOXYzine  (ATARAX ) 25 MG tablet Take 1 tablet (25 mg total) by mouth 3 (three) times daily as needed for anxiety. 01/30/24   Nwoko, Uchenna E, PA  nicotine  (NICODERM CQ  - DOSED IN MG/24 HOURS) 21 mg/24hr patch Place 1 patch (21 mg total) onto the skin daily at 6 (six) AM. 07/14/23   McLauchlin, Jon, NP  risperiDONE  (RISPERDAL ) 2 MG tablet Take 1 tablet (2 mg total) by mouth at bedtime. 01/30/24   Nwoko, Uchenna  E, PA  sertraline  (ZOLOFT ) 50 MG tablet Take 1 tablet (50 mg total) by mouth daily. 01/30/24   Nwoko, Uchenna E, PA  traZODone  (DESYREL ) 50 MG tablet Take 1 tablet (50 mg total) by mouth at bedtime as needed for sleep. 01/30/24   Nwoko, Uchenna E, PA  Vitamin D , Ergocalciferol , (DRISDOL ) 1.25 MG (50000 UNIT) CAPS capsule Take 1 capsule (50,000 Units total) by mouth every 7 (seven) days. 09/06/23   Tex Drilling, NP  rizatriptan  (MAXALT -MLT) 10 MG disintegrating tablet Take 1 tablet at onset of migraine with 2 ibuprofen  may repeat an additional tablet in 2 hours if needed 05/07/20 06/09/20  Susen Elsie DEL, MD    Family History Family History  Problem Relation Age of Onset   Depression Mother    Stroke Mother    Obesity Mother    Post-traumatic stress disorder Mother    Anxiety disorder Mother    Hypertension Mother    Diabetes Mother    Schizophrenia Father    Kidney disease Father     Social History Social History   Tobacco Use   Smoking status: Every Day    Current packs/day: 0.25    Types: Cigarettes   Smokeless tobacco: Never   Tobacco comments:    black and milds, 2 cigs daily  Vaping Use   Vaping status: Former  Substance Use Topics   Alcohol use: Not Currently     Comment: occasionally, prior to pregnancy   Drug use: Not Currently    Types: Marijuana    Comment: last 2020     Allergies   Benadryl  [diphenhydramine  hcl], Haldol  [haloperidol ], Latex, Shellfish allergy, Zithromax [azithromycin], Camphor, Gluten meal, and Lactose intolerance (gi)   Review of Systems Review of Systems  Per HPI  Physical Exam Triage Vital Signs ED Triage Vitals  Encounter Vitals Group     BP 05/03/24 1201 111/75     Girls Systolic BP Percentile --      Girls Diastolic BP Percentile --      Boys Systolic BP Percentile --      Boys Diastolic BP Percentile --      Pulse Rate 05/03/24 1201 82     Resp 05/03/24 1201 16     Temp 05/03/24 1201 98.4 F (36.9 C)     Temp Source 05/03/24 1201 Oral     SpO2 05/03/24 1201 98 %     Weight --      Height --      Head Circumference --      Peak Flow --      Pain Score 05/03/24 1204 7     Pain Loc --      Pain Education --      Exclude from Growth Chart --    No data found.  Updated Vital Signs BP 111/75 (BP Location: Left Arm)   Pulse 82   Temp 98.4 F (36.9 C) (Oral)   Resp 16   LMP 04/09/2024 (Approximate)   SpO2 98%   Breastfeeding No   Visual Acuity Right Eye Distance:   Left Eye Distance:   Bilateral Distance:    Right Eye Near:   Left Eye Near:    Bilateral Near:     Physical Exam Vitals and nursing note reviewed.  Constitutional:      Appearance: Normal appearance.  HENT:     Head: Normocephalic and atraumatic.     Right Ear: External ear normal.     Left Ear: External ear normal.  Nose: Nose normal.     Mouth/Throat:     Mouth: Mucous membranes are moist.  Eyes:     Conjunctiva/sclera: Conjunctivae normal.  Cardiovascular:     Rate and Rhythm: Normal rate and regular rhythm.     Heart sounds: Normal heart sounds. No murmur heard. Pulmonary:     Effort: Pulmonary effort is normal. No respiratory distress.     Breath sounds: Normal breath sounds. No wheezing.  Abdominal:      General: Abdomen is flat.     Palpations: Abdomen is soft.     Tenderness: There is abdominal tenderness. There is no guarding or rebound.  Musculoskeletal:        General: Normal range of motion.  Skin:    General: Skin is warm and dry.  Neurological:     General: No focal deficit present.     Mental Status: She is alert and oriented to person, place, and time.  Psychiatric:        Mood and Affect: Mood normal.        Behavior: Behavior normal.      UC Treatments / Results  Labs (all labs ordered are listed, but only abnormal results are displayed) Labs Reviewed  RPR  HIV ANTIBODY (ROUTINE TESTING W REFLEX)  POCT URINE PREGNANCY  CERVICOVAGINAL ANCILLARY ONLY    EKG   Radiology DG Chest 2 View Result Date: 05/03/2024 CLINICAL DATA:  Shortness of breath over the last 2 weeks EXAM: CHEST - 2 VIEW COMPARISON:  09/06/2023 FINDINGS: The lungs appear clear. Cardiac and mediastinal contours normal. No blunting of the costophrenic angles. IMPRESSION: 1. No active cardiopulmonary disease is radiographically apparent. Electronically Signed   By: Ryan Salvage M.D.   On: 05/03/2024 13:03    Procedures Procedures (including critical care time)  Medications Ordered in UC Medications  cefTRIAXone  (ROCEPHIN ) injection 0.5 g (has no administration in time range)    Initial Impression / Assessment and Plan / UC Course  I have reviewed the triage vital signs and the nursing notes.  Pertinent labs & imaging results that were available during my care of the patient were reviewed by me and considered in my medical decision making (see chart for details).  Vitals and triage reviewed, patient is hemodynamically stable.  Lungs vesicular, heart with regular rate and rhythm.  Oxygen 98% on room air, no acute distress.  Able to speak in full sentences.  Chest x-ray by my interpretation does not show pneumonia or acute cardiopulmonary abnormality.  Will treat for asthma exacerbation with  steroid burst.  Patient with diffuse abdominal tenderness, without rebound or guarding.  Cannot find on medical records where patient was diagnosed with PID, is having abnormal discharge and is sexually active.  Will treat empirically with IM Rocephin  and doxycycline  for gonorrhea and chlamydia.  Diflucan  as needed for yeast vaginitis if this develops.  Staff will contact if additional treatment is needed based on STI screening.  Patient endorses active herpes simplex outbreak, will treat with Valtrex .    Plan of care, follow-up care return precautions given, no questions at this time.     Final Clinical Impressions(s) / UC Diagnoses   Final diagnoses:  Acute vaginitis  Shortness of breath  Genital herpes simplex, unspecified site  Mild intermittent asthma with acute exacerbation     Discharge Instructions      You have been treated empirically for gonorrhea and chlamydia.  The Rocephin  will treat gonorrhea.  The doxycycline  tablets I have sent to your  pharmacy will treat chlamydia.  Take these twice daily with food for the next 7 days.  Take the Diflucan  as needed if vaginal itching develops.  Abstain from intercourse until all sexually-transmitted infection screening has returned, notify partners of any positive results so they can also receive treatment.  For your wheezing and shortness of breath take the prednisone  daily with breakfast for the next 5 days.  Continue to use your inhaler as needed.  For your herpes simplex outbreak take the Valtrex .  Follow-up with your primary care provider for refills as needed for this ongoing issue.  Return to clinic for new or urgent issues.  Follow-up with your primary care provider as scheduled.     ED Prescriptions     Medication Sig Dispense Auth. Provider   valACYclovir  (VALTREX ) 1000 MG tablet Take 0.5 tablets (500 mg total) by mouth 2 (two) times daily for 3 days. 3 tablet Dreama, Shamonica Schadt  N, FNP   doxycycline  (VIBRA -TABS) 100  MG tablet Take 1 tablet (100 mg total) by mouth 2 (two) times daily for 7 days. 14 tablet Sammye Staff  N, FNP   fluconazole  (DIFLUCAN ) 150 MG tablet As needed if vaginal itching develops. 1 tablet Dreama, Keyani Rigdon  N, FNP   predniSONE  (DELTASONE ) 20 MG tablet Take 2 tablets (40 mg total) by mouth daily for 5 days. 10 tablet Dreama, Elvis Laufer  N, FNP      PDMP not reviewed this encounter.   Dreama Chrishelle Zito  N, FNP 05/03/24 1307

## 2024-05-03 NOTE — Discharge Instructions (Addendum)
 You have been treated empirically for gonorrhea and chlamydia.  The Rocephin  will treat gonorrhea.  The doxycycline  tablets I have sent to your pharmacy will treat chlamydia.  Take these twice daily with food for the next 7 days.  Take the Diflucan  as needed if vaginal itching develops.  Abstain from intercourse until all sexually-transmitted infection screening has returned, notify partners of any positive results so they can also receive treatment.  For your wheezing and shortness of breath take the prednisone  daily with breakfast for the next 5 days.  Continue to use your inhaler as needed.  For your herpes simplex outbreak take the Valtrex .  Follow-up with your primary care provider for refills as needed for this ongoing issue.  Return to clinic for new or urgent issues.  Follow-up with your primary care provider as scheduled.

## 2024-05-03 NOTE — ED Triage Notes (Signed)
 Patient here today with c/o SOB X 2 weeks. Patient states that it's worse with activity. Patient has a h/o asthma and uses Ventolin . Patient states that she had some fluid in her lungs last month and has some chest soreness for the past 2 weeks.   Patient also c/o vaginal itching and discharge X 5 days. Patient is also requesting a refill on Valtrex  due to frequent outbreaks.

## 2024-05-04 LAB — RPR: RPR Ser Ql: NONREACTIVE

## 2024-05-05 LAB — CERVICOVAGINAL ANCILLARY ONLY
Bacterial Vaginitis (gardnerella): POSITIVE — AB
Candida Glabrata: POSITIVE — AB
Candida Vaginitis: POSITIVE — AB
Chlamydia: NEGATIVE
Comment: NEGATIVE
Comment: NEGATIVE
Comment: NEGATIVE
Comment: NEGATIVE
Comment: NEGATIVE
Comment: NORMAL
Neisseria Gonorrhea: NEGATIVE
Trichomonas: NEGATIVE

## 2024-05-06 ENCOUNTER — Ambulatory Visit (HOSPITAL_COMMUNITY): Payer: Self-pay

## 2024-05-06 MED ORDER — METRONIDAZOLE 500 MG PO TABS
500.0000 mg | ORAL_TABLET | Freq: Two times a day (BID) | ORAL | 0 refills | Status: DC
Start: 2024-05-06 — End: 2024-07-11

## 2024-05-09 ENCOUNTER — Telehealth: Payer: MEDICAID | Admitting: Physician Assistant

## 2024-05-09 DIAGNOSIS — R079 Chest pain, unspecified: Secondary | ICD-10-CM

## 2024-05-09 NOTE — Progress Notes (Signed)
 Virtual Visit Consent   Melissa [name], you are scheduled for a virtual visit with a [facility] provider today. Just as with appointments in the office, your consent must be obtained to participate. Your consent will be active for this visit and any virtual visit you may have with one of our providers in the next 365 days. If you have a MyChart account, a copy of this consent can be sent to you electronically.  As this is a virtual visit, video technology does not allow for your provider to perform a traditional examination. This may limit your provider's ability to fully assess your condition. If your provider identifies any concerns that need to be evaluated in person or the need to arrange testing (such as labs, EKG, etc.), we will make arrangements to do so. Although advances in technology are sophisticated, we cannot ensure that it will always work on either your end or our end. If the connection with a video visit is poor, the visit may have to be switched to a telephone visit. With either a video or telephone visit, we are not always able to ensure that we have a secure connection.  By engaging in this virtual visit, you consent to the provision of healthcare and authorize for your insurance to be billed (if applicable) for the services provided during this visit. Depending on your insurance coverage, you may receive a charge related to this service.  I need to obtain your verbal consent now. Are you willing to proceed with your visit today? Melissa [name] has provided verbal consent on 05/09/2024 for a virtual visit (video or telephone). Teena Shuck, [place]  Date: 05/09/2024 10:05 AM   Virtual Visit via Video Note   I, Teena Shuck, connected with  Melissa [name]  ([id number], 07/15/2001) on 05/09/24 at 10:00 AM EDT by a video-enabled telemedicine application and verified that I am speaking with the correct person using two identifiers.  Location: Patient: Virtual  Visit Location Patient: Home Provider: Virtual Visit Location Provider: Home Office   I discussed the limitations of evaluation and management by telemedicine and the availability of in person appointments. The patient expressed understanding and agreed to proceed.    History of Present Illness: Melissa [name] is a 23 y.o. who identifies as a female who was assigned female at birth, and is being seen today for chest pain.  HPI: Chest Pain  This is a new problem. The current episode started in the past 7 days. The onset quality is gradual. The problem occurs constantly. The problem has been unchanged. The pain does not radiate. Associated symptoms include shortness of breath. The pain is aggravated by deep breathing. She has tried nothing for the symptoms. The treatment provided no relief. Past medical history comments: asthma    Problems:  Patient Active Problem List   Diagnosis Date Noted   Schizoaffective disorder, bipolar type ([name]) 08/29/2023   Alcohol use disorder 08/29/2023   Tobacco use 08/29/2023   Schizophrenia ([name]) 08/28/2023   Major depressive disorder, single episode, severe with psychotic features ([name]) 08/28/2023   MDD (major depressive disorder), recurrent severe, 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   Homelessness 03/29/2023   OCD (obsessive compulsive disorder) 11/23/2022   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   Delivery by emergency cesarean 06/05/2022   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   Vaginal bleeding during pregnancy 05/28/2022   Gonorrhea affecting pregnancy in second trimester 05/18/2022   ?fetal pericardial effusion 05/18/2022   [redacted] weeks  gestation of pregnancy    Preterm premature rupture of membranes (PPROM) with unknown onset of labor 05/17/2022   Oligohydramnios antepartum 05/11/2022   Abnormal antenatal AFP screen 04/12/2022   History of cesarean section 04/07/2022   Bartholin cyst 04/07/2022   History of  herpes genitalis 04/07/2022   Supervision of high risk pregnancy, antepartum 02/08/2022   Trichomonal vaginitis during pregnancy in second trimester 02/01/2022   History of postpartum depression 11/16/2021   Abnormal involuntary movements 08/04/2020   Migraine with aura and without status migrainosus, not intractable 08/12/2019   Seizures ([name])    Sickle cell trait ([name]) 02/13/2019   Rh negative state in antepartum period 02/05/2019   Short interval between pregnancies affecting pregnancy in second trimester, antepartum 02/05/2019   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    Shellfish Allergy Anaphylaxis and Other (See Comments)    Throat tightening   Zithromax [Azithromycin] Anaphylaxis and Swelling   Camphor Other (See Comments)    Unknown reaction   Gluten Meal Other (See Comments)    GI Intolerance   Lactose Intolerance (Gi) Diarrhea   Medications:  Current Outpatient Medications:    albuterol  (VENTOLIN  HFA) 108 (90 Base) MCG/ACT inhaler, Inhale 1-2 puffs into the lungs every 4 (four) hours as needed for wheezing or shortness of breath., Disp: 1 each, Rfl: 0   busPIRone  (BUSPAR ) 10 MG tablet, Take 1 tablet (10 mg total) by mouth daily., Disp: 30 tablet, Rfl: 1   busPIRone  (BUSPAR ) 10 MG tablet, Take 2 tablets (20 mg total) by mouth at bedtime., Disp: 60 tablet, Rfl: 1   doxycycline  (VIBRA -TABS) 100  MG tablet, Take 1 tablet (100 mg total) by mouth 2 (two) times daily for 7 days., Disp: 14 tablet, Rfl: 0   fluconazole  (DIFLUCAN ) 150 MG tablet, As needed if vaginal itching develops., Disp: 1 tablet, Rfl: 0   hydrOXYzine  (ATARAX ) 25 MG tablet, Take 1 tablet (25 mg total) by mouth 3 (three) times daily as needed for anxiety., Disp: 75 tablet, Rfl: 1   metroNIDAZOLE  (FLAGYL ) 500 MG tablet, Take 1 tablet (500 mg total) by mouth 2 (two) times daily., Disp: 14 tablet, Rfl: 0   nicotine  (NICODERM CQ  - DOSED IN MG/24 HOURS) 21 mg/24hr patch, Place 1 patch (21 mg total) onto the skin daily at 6 (six) AM., Disp: 14 patch, Rfl: 0   risperiDONE  (RISPERDAL ) 2 MG tablet, Take 1 tablet (2 mg total) by mouth at bedtime., Disp: 30 tablet, Rfl: 1   sertraline  (ZOLOFT ) 50 MG tablet, Take 1 tablet (50 mg total) by mouth daily., Disp: 30 tablet, Rfl: 1   traZODone  (DESYREL ) 50 MG tablet, Take 1 tablet (50 mg total) by mouth at bedtime as needed for sleep., Disp: 30 tablet, Rfl: 1   Vitamin D , Ergocalciferol , (DRISDOL ) 1.25 MG (50000 UNIT) CAPS capsule, Take 1 capsule (50,000 Units total) by mouth every 7 (seven) days., Disp: 4 capsule, Rfl: 0  Observations/Objective:  Patient is well-developed, well-nourished in no acute distress.  Resting comfortably  at home.  Head is normocephalic, atraumatic.  No labored breathing. Speech is clear and coherent with logical content.  Patient is alert and oriented at baseline.    Assessment and Plan: 1. Chest pain, unspecified type (Primary)  Patient with ongoing chest pain, recently evaluated for asthma at urgent care. She is not toxic or acutely ill appearing however I suggested she report to urgent care for evaluation. All questions answered and patient is in agreement with this plan.   Follow Up Instructions: I discussed the assessment and treatment plan with the patient. The patient was provided an opportunity to ask questions and all were answered. The patient agreed  with the plan and demonstrated an understanding of the instructions.  A copy of instructions were sent to the patient via MyChart unless otherwise noted below.   The patient was advised to call back or seek an in-person evaluation if the symptoms worsen or if the condition fails to improve as anticipated.    Teena Shuck, PA-C

## 2024-05-09 NOTE — Patient Instructions (Signed)
 Melissa [name], thank you for joining Teena Shuck, PA-C for today's virtual visit.  While this provider is not your primary care provider (PCP), if your PCP is located in our provider database this encounter information will be shared with them immediately following your visit.   A [facility] MyChart account gives you access to today's visit and all your visits, tests, and labs performed at [facility]  click here if you don't have a [facility] MyChart account or go to [email or web address]  Consent: (Patient) Melissa [name] provided verbal consent for this virtual visit at the beginning of the encounter.  Current Medications:  Current Outpatient Medications:    albuterol  (VENTOLIN  HFA) 108 (90 Base) MCG/ACT inhaler, Inhale 1-2 puffs into the lungs every 4 (four) hours as needed for wheezing or shortness of breath., Disp: 1 each, Rfl: 0   busPIRone  (BUSPAR ) 10 MG tablet, Take 1 tablet (10 mg total) by mouth daily., Disp: 30 tablet, Rfl: 1   busPIRone  (BUSPAR ) 10 MG tablet, Take 2 tablets (20 mg total) by mouth at bedtime., Disp: 60 tablet, Rfl: 1   doxycycline  (VIBRA -TABS) 100 MG tablet, Take 1 tablet (100 mg total) by mouth 2 (two) times daily for 7 days., Disp: 14 tablet, Rfl: 0   fluconazole  (DIFLUCAN ) 150 MG tablet, As needed if vaginal itching develops., Disp: 1 tablet, Rfl: 0   hydrOXYzine  (ATARAX ) 25 MG tablet, Take 1 tablet (25 mg total) by mouth 3 (three) times daily as needed for anxiety., Disp: 75 tablet, Rfl: 1   metroNIDAZOLE  (FLAGYL ) 500 MG tablet, Take 1 tablet (500 mg total) by mouth 2 (two) times daily., Disp: 14 tablet, Rfl: 0   nicotine  (NICODERM CQ  - DOSED IN MG/24 HOURS) 21 mg/24hr patch, Place 1 patch (21 mg total) onto the skin daily at 6 (six) AM., Disp: 14 patch, Rfl: 0   risperiDONE  (RISPERDAL ) 2 MG tablet, Take 1 tablet (2 mg total) by mouth at bedtime., Disp: 30 tablet, Rfl: 1   sertraline  (ZOLOFT ) 50 MG tablet, Take 1 tablet (50  mg total) by mouth daily., Disp: 30 tablet, Rfl: 1   traZODone  (DESYREL ) 50 MG tablet, Take 1 tablet (50 mg total) by mouth at bedtime as needed for sleep., Disp: 30 tablet, Rfl: 1   Vitamin D , Ergocalciferol , (DRISDOL ) 1.25 MG (50000 UNIT) CAPS capsule, Take 1 capsule (50,000 Units total) by mouth every 7 (seven) days., Disp: 4 capsule, Rfl: 0   Medications ordered in this encounter:  No orders of the defined types were placed in this encounter.    *If you need refills on other medications prior to your next appointment, please contact your pharmacy*  Follow-Up: Call back or seek an in-person evaluation if the symptoms worsen or if the condition fails to improve as anticipated.  [facility] [phone]  Other Instructions Report to Urgent Care.    If you have been instructed to have an in-person evaluation today at a local Urgent Care facility, please use the link below. It will take you to a list of all of our available [facility], including address, phone number and hours of operation. Please do not delay care.  [facility]  If you or a family member do not have a primary care provider, use the link below to schedule a visit and establish care. When you choose a [facility] primary care physician or advanced practice provider, you gain a long-term partner in health. Find a Primary Care Provider  Learn more about [facility] in-office and virtual care options: [facility] - Get Care Now

## 2024-05-14 ENCOUNTER — Encounter: Payer: Self-pay | Admitting: Neurology

## 2024-06-02 ENCOUNTER — Ambulatory Visit (HOSPITAL_COMMUNITY): Payer: MEDICAID

## 2024-06-08 ENCOUNTER — Emergency Department (HOSPITAL_COMMUNITY)
Admission: EM | Admit: 2024-06-08 | Discharge: 2024-06-08 | Discharge status: Against Medical Advice | Payer: MEDICAID | Attending: Emergency Medicine | Admitting: Emergency Medicine

## 2024-06-08 ENCOUNTER — Encounter (HOSPITAL_COMMUNITY): Payer: Self-pay

## 2024-06-08 ENCOUNTER — Other Ambulatory Visit: Payer: Self-pay

## 2024-06-08 ENCOUNTER — Emergency Department (HOSPITAL_COMMUNITY): Payer: MEDICAID

## 2024-06-08 DIAGNOSIS — N939 Abnormal uterine and vaginal bleeding, unspecified: Secondary | ICD-10-CM | POA: Diagnosis present

## 2024-06-08 DIAGNOSIS — Z5321 Procedure and treatment not carried out due to patient leaving prior to being seen by health care provider: Secondary | ICD-10-CM | POA: Diagnosis not present

## 2024-06-08 DIAGNOSIS — R0602 Shortness of breath: Secondary | ICD-10-CM | POA: Insufficient documentation

## 2024-06-08 DIAGNOSIS — R42 Dizziness and giddiness: Secondary | ICD-10-CM | POA: Diagnosis not present

## 2024-06-08 DIAGNOSIS — R002 Palpitations: Secondary | ICD-10-CM | POA: Diagnosis not present

## 2024-06-08 LAB — CBC WITH DIFFERENTIAL/PLATELET
Abs Immature Granulocytes: 0.02 K/uL (ref 0.00–0.07)
Basophils Absolute: 0 K/uL (ref 0.0–0.1)
Basophils Relative: 0 %
Eosinophils Absolute: 0.2 K/uL (ref 0.0–0.5)
Eosinophils Relative: 2 %
HCT: 40.2 % (ref 36.0–46.0)
Hemoglobin: 13.4 g/dL (ref 12.0–15.0)
Immature Granulocytes: 0 %
Lymphocytes Relative: 37 %
Lymphs Abs: 2.6 K/uL (ref 0.7–4.0)
MCH: 27.5 pg (ref 26.0–34.0)
MCHC: 33.3 g/dL (ref 30.0–36.0)
MCV: 82.5 fL (ref 80.0–100.0)
Monocytes Absolute: 0.6 K/uL (ref 0.1–1.0)
Monocytes Relative: 8 %
Neutro Abs: 3.8 K/uL (ref 1.7–7.7)
Neutrophils Relative %: 53 %
Platelets: 279 K/uL (ref 150–400)
RBC: 4.87 MIL/uL (ref 3.87–5.11)
RDW: 14.4 % (ref 11.5–15.5)
WBC: 7.1 K/uL (ref 4.0–10.5)
nRBC: 0 % (ref 0.0–0.2)

## 2024-06-08 LAB — BASIC METABOLIC PANEL WITH GFR
Anion gap: 11 (ref 5–15)
BUN: <5 mg/dL — ABNORMAL LOW (ref 6–20)
CO2: 23 mmol/L (ref 22–32)
Calcium: 9.1 mg/dL (ref 8.9–10.3)
Chloride: 103 mmol/L (ref 98–111)
Creatinine, Ser: 0.77 mg/dL (ref 0.44–1.00)
GFR, Estimated: >60 mL/min (ref >60)
Glucose, Bld: 88 mg/dL (ref 70–99)
Potassium: 3.8 mmol/L (ref 3.5–5.1)
Sodium: 137 mmol/L (ref 135–145)

## 2024-06-08 LAB — HCG, SERUM, QUALITATIVE: Preg, Serum: NEGATIVE

## 2024-06-08 MED ORDER — IBUPROFEN 400 MG PO TABS
400.0000 mg | ORAL_TABLET | Freq: Once | ORAL | Status: AC
  Administered 2024-06-08: 400 mg via ORAL
  Filled 2024-06-08: qty 1

## 2024-06-08 MED ORDER — ACETAMINOPHEN 500 MG PO TABS
1000.0000 mg | ORAL_TABLET | Freq: Once | ORAL | Status: AC
  Administered 2024-06-08: 1000 mg via ORAL
  Filled 2024-06-08: qty 2

## 2024-06-08 NOTE — ED Notes (Signed)
Pt called [facility] to go to a room. Pt could not be found.  

## 2024-06-08 NOTE — ED Provider Triage Note (Signed)
 Emergency Medicine Provider Triage Evaluation Note  Melissa [name] , a 23 y.o. female  was evaluated in triage.  Pt complains of pelvic cramping and pain as well as heavy vaginal bleeding.  Patient had heavy menses approximately 2 weeks ago and stopped bleeding for several days and then restarted bleeding on Monday of this past week and has bled for the last 6 days and in the last 48 hours the bleeding has become very heavy going through multiple pads per hour with clot.  She does have a prior history of anemia and reports in the last 2 days she started feeling lightheaded with palpitations and shortness of breath and is concerned she is anemic  Review of Systems  Positive: Pelvic cramps, vaginal bleeding, palpitations, shortness of breath and lightheadedness Negative: No fever, vomiting  Physical Exam  BP 101/72 (BP Location: Right Arm)   Pulse 87   Temp 98.6 F (37 C)   Resp 18   SpO2 98%  Gen:   Awake, no distress slightly sleepy Resp:  Normal effort  MSK:   Moves extremities without difficulty  Other:  Suprapubic discomfort  Medical Decision Making  Medically screening exam initiated at 4:47 PM.  Appropriate orders placed.  Melissa [name] was informed that the remainder of the evaluation will be completed by another provider, this initial triage assessment does not replace that evaluation, and the importance of remaining in the ED until their evaluation is complete.     Doretha Folks, MD 06/08/24 [phone]

## 2024-06-08 NOTE — ED Triage Notes (Signed)
 Reports 2nd time bleeding this month.  Reports 7 pads a day and woke up this morning with blood all in her bed.  Reports seeing spots and feeling lightheaded dizzy and sob of breath.

## 2024-06-08 NOTE — ED Notes (Signed)
 Pt called multiple times for room placement.  No answer.

## 2024-06-19 ENCOUNTER — Ambulatory Visit (HOSPITAL_COMMUNITY)
Admission: EM | Admit: 2024-06-19 | Discharge: 2024-06-19 | Disposition: A | Discharge status: Home / Self Care | Payer: MEDICAID | Attending: Family Medicine | Admitting: Family Medicine

## 2024-06-19 ENCOUNTER — Encounter (HOSPITAL_COMMUNITY): Payer: Self-pay | Admitting: Emergency Medicine

## 2024-06-19 DIAGNOSIS — Z113 Encounter for screening for infections with a predominantly sexual mode of transmission: Secondary | ICD-10-CM | POA: Diagnosis present

## 2024-06-19 DIAGNOSIS — B356 Tinea cruris: Secondary | ICD-10-CM | POA: Insufficient documentation

## 2024-06-19 DIAGNOSIS — N898 Other specified noninflammatory disorders of vagina: Secondary | ICD-10-CM | POA: Insufficient documentation

## 2024-06-19 DIAGNOSIS — Z3202 Encounter for pregnancy test, result negative: Secondary | ICD-10-CM

## 2024-06-19 DIAGNOSIS — R300 Dysuria: Secondary | ICD-10-CM | POA: Diagnosis not present

## 2024-06-19 LAB — POCT URINALYSIS DIP (MANUAL ENTRY)
Bilirubin, UA: NEGATIVE
Blood, UA: NEGATIVE
Glucose, UA: NEGATIVE mg/dL
Ketones, POC UA: NEGATIVE mg/dL
Leukocytes, UA: NEGATIVE
Nitrite, UA: NEGATIVE
Protein Ur, POC: NEGATIVE mg/dL
Spec Grav, UA: 1.01 (ref 1.010–1.025)
Urobilinogen, UA: 0.2 U/dL
pH, UA: 5.5 (ref 5.0–8.0)

## 2024-06-19 LAB — HIV ANTIBODY (ROUTINE TESTING W REFLEX): HIV Screen 4th Generation wRfx: NONREACTIVE

## 2024-06-19 LAB — POCT URINE PREGNANCY: Preg Test, Ur: NEGATIVE

## 2024-06-19 MED ORDER — NYSTATIN 100000 UNIT/GM EX POWD: 1.0000 | Freq: Three times a day (TID) | CUTANEOUS | 0 refills | Status: AC

## 2024-06-19 NOTE — ED Provider Notes (Signed)
 [facility]    CSN: [id number] Arrival date & time: 06/19/24  1647      History   Chief Complaint Chief Complaint  Patient presents with   Dysuria    HPI Melissa [name] is a 23 y.o. female.   Patient presents with dysuria, abnormal vaginal discharge, and vaginal odor for some for about 1 week.  Patient also endorses mild intermittent lower abdominal pain. Patient reports that she was sexually active with a new partner last week.  Patient denies any known exposures to STDs.  LMP 10/17.  Patient reports that she also has an itchy rash and irritation to the top of her bottom that began a few days ago.  Patient denies applying anything to the rash.  Patient denies any drainage from this area that she knows of.  The history is provided by the patient and medical records.  Dysuria   Past Medical History:  Diagnosis Date   Anemia    Anxiety    Asthma    inhaler. 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   Seizures ([name])    last one in 2015 - on meds   Sickle cell trait    TBI (traumatic brain injury) ([name]) 2006    Patient Active Problem List   Diagnosis Date Noted   Schizoaffective disorder, bipolar type ([name]) 08/29/2023   Alcohol use disorder 08/29/2023   Tobacco use 08/29/2023   Schizophrenia ([name]) 08/28/2023   Major depressive disorder, single episode, severe with psychotic features ([name]) 08/28/2023   MDD (major depressive disorder),  recurrent severe, 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   Homelessness 03/29/2023   OCD (obsessive compulsive disorder) 11/23/2022   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   Delivery by emergency cesarean 06/05/2022   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   Vaginal bleeding during pregnancy 05/28/2022   Gonorrhea affecting pregnancy in second trimester 05/18/2022   ?fetal pericardial effusion 05/18/2022   [redacted] weeks gestation of pregnancy    Preterm premature rupture of membranes (PPROM) with unknown onset of labor 05/17/2022   Oligohydramnios antepartum 05/11/2022   Abnormal antenatal AFP screen 04/12/2022   History of cesarean section 04/07/2022   Bartholin cyst 04/07/2022   History of herpes genitalis 04/07/2022   Supervision of high risk pregnancy, antepartum 02/08/2022   Trichomonal vaginitis during pregnancy in second trimester 02/01/2022   History of postpartum depression 11/16/2021   Abnormal involuntary movements 08/04/2020   Migraine with aura and without status migrainosus, not intractable 08/12/2019   Seizures ([name])    Sickle cell trait 02/13/2019   Rh negative state in antepartum period 02/05/2019   Short interval between pregnancies affecting pregnancy in second trimester, antepartum 02/05/2019   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   DMDD (disruptive mood dysregulation disorder) 07/21/2016   Bipolar 1 disorder, mixed ([name]) 07/17/2016   Chronic constipation 08/03/2015   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  GAD (generalized anxiety disorder) 11/25/2013    Past Surgical History:  Procedure Laterality Date   APPENDECTOMY     CESAREAN SECTION N/A 08/31/2021   Procedure: CESAREAN SECTION;  Surgeon: Kandis Devaughn Sayres, MD;  Location: [facility];  Service: Obstetrics;  Laterality: N/A;   CESAREAN SECTION N/A 06/05/2022   Procedure: CESAREAN SECTION;   Surgeon: Lorence Ozell CROME, MD;  Location: [facility];  Service: Obstetrics;  Laterality: N/A;   HERNIA REPAIR     INTESTINAL MALROTATION REPAIR  2001    OB History     Gravida  4   Para  3   Term  2   Preterm  1   AB  1   Living  3      SAB  1   IAB      Ectopic      Multiple  0   Live Births  3            Home Medications    Prior to Admission medications   Medication Sig Start Date End Date Taking? Authorizing Provider  nystatin (MYCOSTATIN/NYSTOP) powder Apply 1 Application topically 3 (three) times daily. 06/19/24  Yes Johnie, Laquitha Heslin A, NP  albuterol  (VENTOLIN  HFA) 108 (90 Base) MCG/ACT inhaler Inhale 1-2 puffs into the lungs every 4 (four) hours as needed for wheezing or shortness of breath. 09/04/23   Tex Drilling, NP  busPIRone  (BUSPAR ) 10 MG tablet Take 1 tablet (10 mg total) by mouth daily. 01/30/24   Nwoko, Uchenna E, PA  busPIRone  (BUSPAR ) 10 MG tablet Take 2 tablets (20 mg total) by mouth at bedtime. 01/30/24   Nwoko, Uchenna E, PA  fluconazole  (DIFLUCAN ) 150 MG tablet As needed if vaginal itching develops. 05/03/24   Dreama, [place]  N, FNP  hydrOXYzine  (ATARAX ) 25 MG tablet Take 1 tablet (25 mg total) by mouth 3 (three) times daily as needed for anxiety. 01/30/24   Nwoko, Uchenna E, PA  metroNIDAZOLE  (FLAGYL ) 500 MG tablet Take 1 tablet (500 mg total) by mouth 2 (two) times daily. 05/06/24   Vonna Sharlet POUR, MD  nicotine  (NICODERM CQ  - DOSED IN MG/24 HOURS) 21 mg/24hr patch Place 1 patch (21 mg total) onto the skin daily at 6 (six) AM. 07/14/23   McLauchlin, Angela, NP  risperiDONE  (RISPERDAL ) 2 MG tablet Take 1 tablet (2 mg total) by mouth at bedtime. 01/30/24   Nwoko, Uchenna E, PA  sertraline  (ZOLOFT ) 50 MG tablet Take 1 tablet (50 mg total) by mouth daily. 01/30/24   Nwoko, Uchenna E, PA  traZODone  (DESYREL ) 50 MG tablet Take 1 tablet (50 mg total) by mouth at bedtime as needed for sleep. 01/30/24   Nwoko, Uchenna E, PA  Vitamin D , Ergocalciferol ,  (DRISDOL ) 1.25 MG (50000 UNIT) CAPS capsule Take 1 capsule (50,000 Units total) by mouth every 7 (seven) days. 09/06/23   Tex Drilling, NP  rizatriptan  (MAXALT -MLT) 10 MG disintegrating tablet Take 1 tablet at onset of migraine with 2 ibuprofen  may repeat an additional tablet in 2 hours if needed 05/07/20 06/09/20  Susen Elsie DEL, MD    Family History Family History  Problem Relation Age of Onset   Depression Mother    Stroke Mother    Obesity Mother    Post-traumatic stress disorder Mother    Anxiety disorder Mother    Hypertension Mother    Diabetes Mother    Schizophrenia Father    Kidney disease Father     Social History Social History   Tobacco Use  Smoking status: Every Day    Current packs/day: 0.25    Types: Cigarettes   Smokeless tobacco: Never   Tobacco comments:    black and milds, 2 cigs daily  Vaping Use   Vaping status: Former  Substance Use Topics   Alcohol use: Not Currently    Comment: occasionally, prior to pregnancy   Drug use: Not Currently    Types: Marijuana    Comment: last 2020     Allergies   Benadryl  [diphenhydramine  hcl], Haldol  [haloperidol ], Latex, Shellfish allergy, Zithromax [azithromycin], Camphor, Gluten meal, and Lactose intolerance (gi)   Review of Systems Review of Systems  Genitourinary:  Positive for dysuria.   Per HPI  Physical Exam Triage Vital Signs ED Triage Vitals  Encounter Vitals Group     BP 06/19/24 1756 109/80     Girls Systolic BP Percentile --      Girls Diastolic BP Percentile --      Boys Systolic BP Percentile --      Boys Diastolic BP Percentile --      Pulse Rate 06/19/24 1756 92     Resp 06/19/24 1756 17     Temp 06/19/24 1756 97.8 F (36.6 C)     Temp Source 06/19/24 1756 Oral     SpO2 06/19/24 1756 100 %     Weight --      Height --      Head Circumference --      Peak Flow --      Pain Score 06/19/24 1755 9     Pain Loc --      Pain Education --      Exclude from Growth Chart --     No data found.  Updated Vital Signs BP 109/80 (BP Location: Right Arm)   Pulse 92   Temp 97.8 F (36.6 C) (Oral)   Resp 17   LMP 06/06/2024 (Exact Date)   SpO2 100%   Visual Acuity Right Eye Distance:   Left Eye Distance:   Bilateral Distance:    Right Eye Near:   Left Eye Near:    Bilateral Near:     Physical Exam Vitals and nursing note reviewed.  Constitutional:      General: She is awake. She is not in acute distress.    Appearance: Normal appearance. She is well-developed and well-groomed. She is not ill-appearing.  Abdominal:     General: Abdomen is flat. Bowel sounds are normal. There is no distension.     Palpations: Abdomen is soft.     Tenderness: There is no abdominal tenderness. There is no right CVA tenderness, left CVA tenderness, guarding or rebound.  Genitourinary:    Comments: Exam deferred Skin:    General: Skin is warm and dry.     Capillary Refill: Declined chaperone for exam        Comments: Beefy red rash noted to superior intergluteal cleft.  There is a small tear to this area as well.  Neurological:     Mental Status: She is alert.  Psychiatric:        Behavior: Behavior is cooperative.      UC Treatments / Results  Labs (all labs ordered are listed, but only abnormal results are displayed) Labs Reviewed  POCT URINALYSIS DIP (MANUAL ENTRY) - Abnormal; Notable for the following components:      Result Value   Clarity, UA turbid (*)    All other components within normal limits  URINE CULTURE  HIV ANTIBODY (ROUTINE  TESTING W REFLEX)  RPR  POCT URINE PREGNANCY  CERVICOVAGINAL ANCILLARY ONLY    EKG   Radiology No results found.  Procedures Procedures (including critical care time)  Medications Ordered in UC Medications - No data to display  Initial Impression / Assessment and Plan / UC Course  I have reviewed the triage vital signs and the nursing notes.  Pertinent labs & imaging results that were available during my  care of the patient were reviewed by me and considered in my medical decision making (see chart for details).     Patient is overall well-appearing.  Vitals are stable.  Rash consistent with presence of tinea cruris.  Prescribed nystatin powder for this.  GU exam deferred.  Patient perform self swab for STD/STI.  HIV and RPR ordered.  Urinalysis unremarkable.  Will send urine culture to confirm presence of urinary tract infection due to presence of the dysuria.  UPT negative.  Discussed follow-up and return precautions. Final Clinical Impressions(s) / UC Diagnoses   Final diagnoses:  Vaginal discharge  Dysuria  Tinea cruris  Screen for STD (sexually transmitted disease)     Discharge Instructions      Apply nystatin powder 3 times daily to the rash and area of itching to your bottom. All of your results will come back over the next few days and someone will call if results are positive and require treatment.  Follow-up with primary care provider or return here as needed.    ED Prescriptions     Medication Sig Dispense Auth. Provider   nystatin (MYCOSTATIN/NYSTOP) powder Apply 1 Application topically 3 (three) times daily. 15 g Johnie Flaming A, NP      PDMP not reviewed this encounter.   Johnie Flaming LABOR, NP 06/19/24 [phone]

## 2024-06-19 NOTE — ED Triage Notes (Signed)
 Pt having dysuria and abd pains since last week. Pt adds that she has yellow vaginal discharge and rash at buttock area.

## 2024-06-19 NOTE — Discharge Instructions (Addendum)
 Apply nystatin powder 3 times daily to the rash and area of itching to your bottom. All of your results will come back over the next few days and someone will call if results are positive and require treatment.  Follow-up with primary care provider or return here as needed.

## 2024-06-20 LAB — CERVICOVAGINAL ANCILLARY ONLY
Bacterial Vaginitis (gardnerella): POSITIVE — AB
Candida Glabrata: NEGATIVE
Candida Vaginitis: NEGATIVE
Chlamydia: NEGATIVE
Comment: NEGATIVE
Comment: NEGATIVE
Comment: NEGATIVE
Comment: NEGATIVE
Comment: NEGATIVE
Comment: NORMAL
Neisseria Gonorrhea: NEGATIVE
Trichomonas: NEGATIVE

## 2024-06-20 LAB — RPR: RPR Ser Ql: NONREACTIVE

## 2024-06-20 LAB — URINE CULTURE: Culture: NO GROWTH

## 2024-06-23 ENCOUNTER — Ambulatory Visit (HOSPITAL_COMMUNITY): Payer: Self-pay

## 2024-06-23 MED ORDER — METRONIDAZOLE 0.75 % VA GEL: 1.0000 | Freq: Every day | VAGINAL | 0 refills | Status: AC

## 2024-06-23 MED ORDER — METRONIDAZOLE 500 MG PO TABS: 500.0000 mg | ORAL_TABLET | Freq: Two times a day (BID) | ORAL | 0 refills | Status: DC

## 2024-06-25 MED ORDER — FLUCONAZOLE 150 MG PO TABS: 150.0000 mg | ORAL_TABLET | Freq: Once | ORAL | 0 refills | Status: AC

## 2024-06-25 NOTE — Telephone Encounter (Signed)
 Diflucan  sent per protocol for abx-associated yeast infection.

## 2024-06-25 NOTE — Addendum Note (Signed)
 Addended by: ARNALDO ALFONSO RAMAN on: 06/25/2024 01:58 PM   Modules accepted: Orders

## 2024-07-02 ENCOUNTER — Other Ambulatory Visit: Payer: Self-pay

## 2024-07-02 ENCOUNTER — Emergency Department (HOSPITAL_COMMUNITY)
Admission: EM | Admit: 2024-07-02 | Discharge: 2024-07-02 | Discharge status: Against Medical Advice | Payer: MEDICAID | Attending: Emergency Medicine | Admitting: Emergency Medicine

## 2024-07-02 ENCOUNTER — Encounter (HOSPITAL_COMMUNITY): Payer: Self-pay

## 2024-07-02 ENCOUNTER — Ambulatory Visit: Payer: MEDICAID | Admitting: Neurology

## 2024-07-02 ENCOUNTER — Encounter: Payer: Self-pay | Admitting: Neurology

## 2024-07-02 DIAGNOSIS — Z5321 Procedure and treatment not carried out due to patient leaving prior to being seen by health care provider: Secondary | ICD-10-CM | POA: Diagnosis not present

## 2024-07-02 DIAGNOSIS — R1030 Lower abdominal pain, unspecified: Secondary | ICD-10-CM | POA: Diagnosis present

## 2024-07-02 DIAGNOSIS — R309 Painful micturition, unspecified: Secondary | ICD-10-CM | POA: Diagnosis not present

## 2024-07-02 LAB — URINALYSIS, ROUTINE W REFLEX MICROSCOPIC
Bilirubin Urine: NEGATIVE
Glucose, UA: NEGATIVE mg/dL
Ketones, ur: NEGATIVE mg/dL
Leukocytes,Ua: NEGATIVE
Nitrite: NEGATIVE
Protein, ur: NEGATIVE mg/dL
Specific Gravity, Urine: 1.005 (ref 1.005–1.030)
pH: 7 (ref 5.0–8.0)

## 2024-07-02 LAB — HIV ANTIBODY (ROUTINE TESTING W REFLEX): HIV Screen 4th Generation wRfx: NONREACTIVE

## 2024-07-02 LAB — HCG, SERUM, QUALITATIVE: Preg, Serum: NEGATIVE

## 2024-07-02 NOTE — ED Triage Notes (Signed)
 Pt states she had sex with a new partner 2 days ago. No protection used.C/O yellow discharge, lower abd pain, and odor. Pt requesting to get STd/ hiv CHECKED. C/O painful urination. Pt started period today.

## 2024-07-02 NOTE — ED Provider Triage Note (Signed)
 Emergency Medicine Provider Triage Evaluation Note  Melissa [name] , a 23 y.o. female  was evaluated in triage.  Pt complains of abdominal pain.  She had unprotected intercourse while she was intoxicated 2 nights ago and she is worried she might have infection.  She has had burning in her vaginal area since that time and pelvic pain.  Review of Systems  Positive: Pelvic discomfort Negative: Urinary symptoms  Physical Exam  BP 111/81 (BP Location: Right Arm)   Pulse 76   Temp 98.4 F (36.9 C)   Resp 17   Ht 5' 7 (1.702 m)   Wt 106.6 kg   LMP 07/02/2024 (Exact Date)   SpO2 100%   BMI 36.81 kg/m  Gen:   Awake, no distress   Resp:  Normal effort  MSK:   Moves extremities without difficulty  Other:    Medical Decision Making  Medically screening exam initiated at 3:12 PM.  Appropriate orders placed.  Melissa [name] was informed that the remainder of the evaluation will be completed by another provider, this initial triage assessment does not replace that evaluation, and the importance of remaining in the ED until their evaluation is complete.     Arloa Chroman, PA-C 07/02/24 1515

## 2024-07-02 NOTE — ED Notes (Signed)
 Pt reports she no longer wants to wait to be seen.  Pt encouraged to stay, but chose to leave.

## 2024-07-03 LAB — RPR: RPR Ser Ql: NONREACTIVE

## 2024-07-04 LAB — GC/CHLAMYDIA PROBE AMP (~~LOC~~) NOT AT ARMC
Chlamydia: NEGATIVE
Comment: NEGATIVE
Comment: NORMAL
Neisseria Gonorrhea: NEGATIVE

## 2024-07-09 ENCOUNTER — Encounter (HOSPITAL_COMMUNITY): Payer: MEDICAID | Admitting: Physician Assistant

## 2024-07-11 ENCOUNTER — Ambulatory Visit (HOSPITAL_COMMUNITY)
Admission: EM | Admit: 2024-07-11 | Discharge: 2024-07-11 | Disposition: A | Discharge status: Home / Self Care | Payer: MEDICAID | Attending: Physician Assistant | Admitting: Physician Assistant

## 2024-07-11 ENCOUNTER — Encounter (HOSPITAL_COMMUNITY): Payer: Self-pay

## 2024-07-11 DIAGNOSIS — J069 Acute upper respiratory infection, unspecified: Secondary | ICD-10-CM | POA: Diagnosis present

## 2024-07-11 DIAGNOSIS — Z202 Contact with and (suspected) exposure to infections with a predominantly sexual mode of transmission: Secondary | ICD-10-CM | POA: Diagnosis present

## 2024-07-11 DIAGNOSIS — R051 Acute cough: Secondary | ICD-10-CM | POA: Insufficient documentation

## 2024-07-11 DIAGNOSIS — J4521 Mild intermittent asthma with (acute) exacerbation: Secondary | ICD-10-CM | POA: Insufficient documentation

## 2024-07-11 DIAGNOSIS — Z113 Encounter for screening for infections with a predominantly sexual mode of transmission: Secondary | ICD-10-CM | POA: Insufficient documentation

## 2024-07-11 LAB — POCT INFLUENZA A/B
Influenza A, POC: NEGATIVE
Influenza B, POC: NEGATIVE

## 2024-07-11 LAB — POC SOFIA SARS ANTIGEN FIA: SARS Coronavirus 2 Ag: NEGATIVE

## 2024-07-11 MED ORDER — CEFTRIAXONE SODIUM 500 MG IJ SOLR
500.0000 mg | INTRAMUSCULAR | Status: DC
  Administered 2024-07-11: 500 mg via INTRAMUSCULAR

## 2024-07-11 MED ORDER — DEXAMETHASONE SOD PHOSPHATE PF 10 MG/ML IJ SOLN
10.0000 mg | Freq: Once | INTRAMUSCULAR | Status: AC
Start: 2024-07-11 — End: 2024-07-11
  Administered 2024-07-11: 10 mg via INTRAMUSCULAR

## 2024-07-11 MED ORDER — LIDOCAINE HCL (PF) 1 % IJ SOLN
INTRAMUSCULAR | Status: AC
  Filled 2024-07-11: qty 2

## 2024-07-11 MED ORDER — ALBUTEROL SULFATE HFA 108 (90 BASE) MCG/ACT IN AERS: 1.0000 | INHALATION_SPRAY | Freq: Four times a day (QID) | RESPIRATORY_TRACT | 0 refills | Status: AC | PRN

## 2024-07-11 MED ORDER — CEFTRIAXONE SODIUM 500 MG IJ SOLR
INTRAMUSCULAR | Status: AC
  Filled 2024-07-11: qty 500

## 2024-07-11 MED ORDER — ALBUTEROL SULFATE (2.5 MG/3ML) 0.083% IN NEBU
2.5000 mg | INHALATION_SOLUTION | Freq: Once | RESPIRATORY_TRACT | Status: AC
  Administered 2024-07-11: 2.5 mg via RESPIRATORY_TRACT

## 2024-07-11 MED ORDER — PREDNISONE 20 MG PO TABS: 40.0000 mg | ORAL_TABLET | Freq: Every day | ORAL | 0 refills | Status: AC

## 2024-07-11 MED ORDER — DOXYCYCLINE HYCLATE 100 MG PO CAPS: 100.0000 mg | ORAL_CAPSULE | Freq: Two times a day (BID) | ORAL | 0 refills | Status: DC

## 2024-07-11 MED ORDER — ALBUTEROL SULFATE (2.5 MG/3ML) 0.083% IN NEBU
INHALATION_SOLUTION | RESPIRATORY_TRACT | Status: AC
  Filled 2024-07-11: qty 3

## 2024-07-11 NOTE — ED Triage Notes (Signed)
 Pt c/o cough and congestion since yesterday. Denies taken any meds. States drinking ginger tea.  Pt states had positive exposure to gonorrhea and chlamydia. C/o odor and discharge since yesterday.

## 2024-07-11 NOTE — Discharge Instructions (Signed)
 You tested negative for COVID and flu.  I suspect you have a different virus that has triggered your asthma.  I am glad you are feeling better after the medication.  Start prednisone  tomorrow (07/12/2024).  Do not take NSAIDs with this medication including aspirin, ibuprofen /Advil , naproxen /Aleve .  Use albuterol  before to 6 hours as needed.  Continue your Symbicort as previously prescribed.  If you are not feeling better within 3 to 5 days or if anything worsens and you have high fever, worsening cough, shortness of breath, chest pain you need to be seen immediately.  We gave an injection to cover for gonorrhea today.  Start doxycycline  100 mg twice daily for 7 days to cover for chlamydia.  Stay out of the sun while on this medication.  We will contact you if any of your other testing is positive we will need to arrange additional treatment.  Abstain from sex until 1 week after you finish your treatment.  All partners need to be tested and treated as well.  If you develop any pelvic pain, fever, nausea, vomiting you need to be reevaluated.

## 2024-07-11 NOTE — ED Provider Notes (Signed)
 [facility]    CSN: [id number] Arrival date & time: 07/11/24  1746      History   Chief Complaint Chief Complaint  Patient presents with   Cough   Exposure to STD    HPI Melissa [name] is a 23 y.o. female.   Patient presents today with several concerns.  Her primary concern today is a 24-hour history of URI symptoms including cough, congestion, chest tightness, wheezing.  She denies any fever, nausea, vomiting.  She does have a history of asthma and has been using previously prescribed Symbicort without improvement of symptoms.  She does not have an albuterol  inhaler available.  Denies any recent antibiotics or steroids.  She has never been hospitalized for asthma.  Denies any known sick contacts.  She has had COVID several years ago but not recently.  She is confident that she is not pregnant.  In addition, she reports that she has been exposed to both gonorrhea and chlamydia.  She is requesting treatment as she is experiencing vaginal discharge and odor.  She does have a history of recurrent BV but is concerned because the recent partner informed her that he tested positive.  She denies any pelvic pain, abdominal pain, nausea, vomiting.  She is confident that she is not pregnant.  Denies any recent antibiotics.    Past Medical History:  Diagnosis Date   Anemia    Anxiety    Asthma    inhaler. 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   Seizures  ([name])    last one in 2015 - on meds   Sickle cell trait    TBI (traumatic brain injury) ([name]) 2006    Patient Active Problem List   Diagnosis Date Noted   Schizoaffective disorder, bipolar type ([name]) 08/29/2023   Alcohol use disorder 08/29/2023   Tobacco use 08/29/2023   Schizophrenia ([name]) 08/28/2023   Major depressive disorder, single episode, severe with psychotic features ([name]) 08/28/2023   MDD (major depressive disorder), recurrent severe, 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   Homelessness 03/29/2023   OCD (obsessive compulsive disorder) 11/23/2022   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   Delivery by emergency cesarean 06/05/2022   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   Vaginal bleeding during pregnancy 05/28/2022   Gonorrhea affecting pregnancy in second trimester 05/18/2022   ?fetal pericardial effusion 05/18/2022   [redacted] weeks gestation of pregnancy    Preterm premature rupture of membranes (PPROM) with unknown onset of labor 05/17/2022   Oligohydramnios antepartum 05/11/2022   Abnormal antenatal AFP screen 04/12/2022   History of cesarean section 04/07/2022   Bartholin cyst 04/07/2022   History of herpes genitalis 04/07/2022   Supervision of high risk pregnancy, antepartum 02/08/2022   Trichomonal vaginitis during pregnancy in second trimester 02/01/2022   History of postpartum depression 11/16/2021   Abnormal involuntary movements 08/04/2020   Migraine with aura and without status migrainosus, not intractable 08/12/2019   Seizures ([name])    Sickle  cell trait 02/13/2019   Rh negative state in antepartum period 02/05/2019   Short interval between pregnancies affecting pregnancy in second trimester, antepartum 02/05/2019   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   DMDD (disruptive mood dysregulation disorder) 07/21/2016   Bipolar 1 disorder, mixed ([name]) 07/17/2016   Chronic constipation 08/03/2015   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    Past Surgical History:  Procedure Laterality Date   APPENDECTOMY     CESAREAN SECTION N/A 08/31/2021   Procedure: CESAREAN SECTION;  Surgeon: Kandis Devaughn Sayres, MD;  Location: [facility];  Service: Obstetrics;  Laterality: N/A;   CESAREAN SECTION N/A 06/05/2022   Procedure: CESAREAN SECTION;  Surgeon: Lorence Ozell CROME, MD;  Location: [facility];  Service: Obstetrics;  Laterality: N/A;   HERNIA REPAIR     INTESTINAL MALROTATION REPAIR  2001    OB History     Gravida  4   Para  3   Term  2   Preterm  1   AB  1   Living  3      SAB  1   IAB      Ectopic      Multiple  0   Live Births  3            Home Medications    Prior to Admission medications   Medication Sig Start Date End Date Taking? Authorizing Provider  albuterol  (VENTOLIN  HFA) 108 (90 Base) MCG/ACT inhaler Inhale 1-2 puffs into the lungs every 6 (six) hours as needed for wheezing or shortness of breath. 07/11/24  Yes Barbara Keng K, PA-C  doxycycline  (VIBRAMYCIN ) 100 MG capsule Take 1 capsule (100 mg total) by mouth 2 (two) times daily. 07/11/24  Yes Arend Bahl K, PA-C  predniSONE  (DELTASONE ) 20 MG tablet Take 2 tablets (40 mg total) by mouth daily for 4 days. 07/11/24 07/15/24 Yes Morell Mears K, PA-C  busPIRone  (BUSPAR ) 10 MG tablet Take 1 tablet (10 mg total) by mouth daily. 01/30/24   Nwoko, Uchenna E, PA  busPIRone  (BUSPAR ) 10 MG tablet Take 2 tablets (20 mg total) by mouth at bedtime. 01/30/24   Nwoko, Uchenna E, PA  hydrOXYzine  (ATARAX ) 25 MG tablet Take 1 tablet (25 mg total) by mouth 3 (three) times daily as needed for anxiety. 01/30/24   Nwoko, Uchenna E, PA  nystatin  (MYCOSTATIN /NYSTOP ) powder Apply 1 Application topically 3 (three) times daily. 06/19/24   Johnie Flaming A, NP  risperiDONE  (RISPERDAL ) 2 MG tablet Take 1 tablet (2 mg total) by mouth at  bedtime. 01/30/24   Nwoko, Uchenna E, PA  sertraline  (ZOLOFT ) 50 MG tablet Take 1 tablet (50 mg total) by mouth daily. 01/30/24   Nwoko, Uchenna E, PA  traZODone  (DESYREL ) 50 MG tablet Take 1 tablet (50 mg total) by mouth at bedtime as needed for sleep. 01/30/24   Nwoko, Uchenna E, PA  Vitamin D , Ergocalciferol , (DRISDOL ) 1.25 MG (50000 UNIT) CAPS capsule Take 1 capsule (50,000 Units total) by mouth every 7 (seven) days. 09/06/23   Tex Drilling, NP  rizatriptan  (MAXALT -MLT) 10 MG disintegrating tablet Take 1 tablet at onset of migraine with 2 ibuprofen  may repeat an additional tablet in 2 hours if needed 05/07/20 06/09/20  Susen Elsie DEL, MD    Family History Family History  Problem Relation Age of Onset   Depression Mother    Stroke Mother  Obesity Mother    Post-traumatic stress disorder Mother    Anxiety disorder Mother    Hypertension Mother    Diabetes Mother    Schizophrenia Father    Kidney disease Father     Social History Social History   Tobacco Use   Smoking status: Every Day    Current packs/day: 0.25    Types: Cigarettes   Smokeless tobacco: Never   Tobacco comments:    black and milds, 2 cigs daily  Vaping Use   Vaping status: Former  Substance Use Topics   Alcohol use: Not Currently    Comment: occasionally, prior to pregnancy   Drug use: Not Currently    Types: Marijuana    Comment: last 2020     Allergies   Benadryl  [diphenhydramine  hcl], Haldol  [haloperidol ], Latex, Shellfish allergy, Zithromax [azithromycin], Camphor, Gluten meal, and Lactose intolerance (gi)   Review of Systems Review of Systems  Constitutional:  Positive for activity change. Negative for appetite change, fatigue and fever.  HENT:  Positive for congestion and sore throat. Negative for sinus pressure and sneezing.   Respiratory:  Positive for cough and shortness of breath.   Cardiovascular:  Negative for chest pain.  Gastrointestinal:  Negative for abdominal pain, diarrhea,  nausea and vomiting.  Genitourinary:  Positive for vaginal discharge. Negative for dysuria, frequency, pelvic pain, urgency, vaginal bleeding and vaginal pain.  Neurological:  Negative for dizziness, light-headedness and headaches.     Physical Exam Triage Vital Signs ED Triage Vitals  Encounter Vitals Group     BP 07/11/24 1913 112/70     Girls Systolic BP Percentile --      Girls Diastolic BP Percentile --      Boys Systolic BP Percentile --      Boys Diastolic BP Percentile --      Pulse Rate 07/11/24 1913 93     Resp 07/11/24 1913 20     Temp 07/11/24 1913 98.4 F (36.9 C)     Temp Source 07/11/24 1913 Oral     SpO2 07/11/24 1913 98 %     Weight --      Height --      Head Circumference --      Peak Flow --      Pain Score 07/11/24 1910 8     Pain Loc --      Pain Education --      Exclude from Growth Chart --    No data found.  Updated Vital Signs BP 112/70 (BP Location: Left Arm)   Pulse 93   Temp 98.4 F (36.9 C) (Oral)   Resp 20   LMP 07/02/2024 (Exact Date)   SpO2 98%   Visual Acuity Right Eye Distance:   Left Eye Distance:   Bilateral Distance:    Right Eye Near:   Left Eye Near:    Bilateral Near:     Physical Exam Vitals reviewed.  Constitutional:      General: She is awake. She is not in acute distress.    Appearance: Normal appearance. She is well-developed. She is not ill-appearing.     Comments: Very pleasant female appears stated age in no acute distress sitting comfortably in exam room  HENT:     Head: Normocephalic and atraumatic.     Right Ear: Tympanic membrane, ear canal and external ear normal. Tympanic membrane is not erythematous or bulging.     Left Ear: Tympanic membrane, ear canal and external ear normal. Tympanic membrane is  not erythematous or bulging.     Nose:     Right Sinus: No maxillary sinus tenderness or frontal sinus tenderness.     Left Sinus: No maxillary sinus tenderness or frontal sinus tenderness.      Mouth/Throat:     Pharynx: Uvula midline. No oropharyngeal exudate or posterior oropharyngeal erythema.  Cardiovascular:     Rate and Rhythm: Normal rate and regular rhythm.     Heart sounds: Normal heart sounds, S1 normal and S2 normal. No murmur heard. Pulmonary:     Effort: Pulmonary effort is normal.     Breath sounds: Wheezing present. No rhonchi or rales.  Abdominal:     General: Bowel sounds are normal.     Palpations: Abdomen is soft.     Tenderness: There is no abdominal tenderness.  Psychiatric:        Behavior: Behavior is cooperative.      UC Treatments / Results  Labs (all labs ordered are listed, but only abnormal results are displayed) Labs Reviewed  POC SOFIA SARS ANTIGEN FIA  POCT INFLUENZA A/B  CERVICOVAGINAL ANCILLARY ONLY  CYTOLOGY, (ORAL, ANAL, URETHRAL) ANCILLARY ONLY    EKG   Radiology No results found.  Procedures Procedures (including critical care time)  Medications Ordered in UC Medications  cefTRIAXone  (ROCEPHIN ) injection 500 mg (500 mg Intramuscular Given 07/11/24 2003)  albuterol  (PROVENTIL ) (2.5 MG/3ML) 0.083% nebulizer solution 2.5 mg (2.5 mg Nebulization Given 07/11/24 2003)  dexamethasone  (DECADRON ) injection 10 mg (10 mg Intramuscular Given 07/11/24 2002)    Initial Impression / Assessment and Plan / UC Course  I have reviewed the triage vital signs and the nursing notes.  Pertinent labs & imaging results that were available during my care of the patient were reviewed by me and considered in my medical decision making (see chart for details).     Patient is well-appearing, afebrile, nontoxic, nontachycardic.  She did have scattered wheezing initially but was given nebulizer treatment as well as Decadron  in clinic with improvement of symptoms.  She tested negative for COVID and flu.  No evidence of acute infection on physical exam that warrant initiation of antibiotics but she has been exposed to chlamydia and so we will start on  doxycycline  we discussed this would also cover for respiratory infections (see below).  She has Symbicort at home and was encouraged to continue taking this medication as prescribed.  Albuterol  was sent for acute symptoms and will start prednisone  burst tomorrow given Decadron  injection today (07/12/2024).  Discussed that she is not to take NSAIDs with this medication due to risk of GI bleeding.  If she is not feeling better in 3 to 5 days or if she has any worsening symptoms she needs to be seen immediately.  Chest x-ray was deferred as her adventitious lung sounds resolved following nebulizer and her oxygen saturation was 98%.  Patient expressed understanding and agreement treatment plan.  Patient reports known exposure to both gonorrhea and chlamydia requested appropriate treatment as she is experiencing vaginal discharge.  She was given 500 mg of Rocephin  in clinic and will start doxycycline  100 mg twice daily for 7 days.  Discussed that she is to avoid prolonged sun exposure while on this medication.  Cervicovaginal swab as well as oral swab for gonorrhea/committee a was obtained and is pending.  We discussed that she is to abstain from sex for 1 week after she finishes her course of treatment.  Also discussed that all partners to be tested and treated as  well.  We discussed that if she develops any pelvic pain, abdominal pain, persistent or changing discharge needs to be seen emergently.  Strict return precautions given.  All questions answered patient satisfaction.  Final Clinical Impressions(s) / UC Diagnoses   Final diagnoses:  Acute cough  Viral URI with cough  Mild intermittent asthma with acute exacerbation  Exposure to chlamydia  Exposure to gonorrhea  Screening examination for STI     Discharge Instructions      You tested negative for COVID and flu.  I suspect you have a different virus that has triggered your asthma.  I am glad you are feeling better after the medication.  Start  prednisone  tomorrow (07/12/2024).  Do not take NSAIDs with this medication including aspirin, ibuprofen /Advil , naproxen /Aleve .  Use albuterol  before to 6 hours as needed.  Continue your Symbicort as previously prescribed.  If you are not feeling better within 3 to 5 days or if anything worsens and you have high fever, worsening cough, shortness of breath, chest pain you need to be seen immediately.  We gave an injection to cover for gonorrhea today.  Start doxycycline  100 mg twice daily for 7 days to cover for chlamydia.  Stay out of the sun while on this medication.  We will contact you if any of your other testing is positive we will need to arrange additional treatment.  Abstain from sex until 1 week after you finish your treatment.  All partners need to be tested and treated as well.  If you develop any pelvic pain, fever, nausea, vomiting you need to be reevaluated.     ED Prescriptions     Medication Sig Dispense Auth. Provider   doxycycline  (VIBRAMYCIN ) 100 MG capsule Take 1 capsule (100 mg total) by mouth 2 (two) times daily. 14 capsule Vivien Barretto K, PA-C   predniSONE  (DELTASONE ) 20 MG tablet Take 2 tablets (40 mg total) by mouth daily for 4 days. 8 tablet Melondy Blanchard K, PA-C   albuterol  (VENTOLIN  HFA) 108 (90 Base) MCG/ACT inhaler Inhale 1-2 puffs into the lungs every 6 (six) hours as needed for wheezing or shortness of breath. 18 g Ridge Lafond K, PA-C      PDMP not reviewed this encounter.   Sherrell Rocky POUR, PA-C 07/11/24 2028

## 2024-07-14 ENCOUNTER — Ambulatory Visit (HOSPITAL_COMMUNITY): Payer: Self-pay

## 2024-07-14 LAB — CERVICOVAGINAL ANCILLARY ONLY
Bacterial Vaginitis (gardnerella): POSITIVE — AB
Candida Glabrata: POSITIVE — AB
Candida Vaginitis: NEGATIVE
Chlamydia: NEGATIVE
Comment: NEGATIVE
Comment: NEGATIVE
Comment: NEGATIVE
Comment: NEGATIVE
Comment: NEGATIVE
Comment: NORMAL
Neisseria Gonorrhea: NEGATIVE
Trichomonas: NEGATIVE

## 2024-07-14 LAB — CYTOLOGY, (ORAL, ANAL, URETHRAL) ANCILLARY ONLY
Chlamydia: NEGATIVE
Comment: NEGATIVE
Comment: NEGATIVE
Comment: NORMAL
Neisseria Gonorrhea: NEGATIVE
Trichomonas: NEGATIVE

## 2024-07-14 MED ORDER — METRONIDAZOLE 500 MG PO TABS: 500.0000 mg | ORAL_TABLET | Freq: Two times a day (BID) | ORAL | 0 refills | Status: AC

## 2024-07-17 ENCOUNTER — Emergency Department (HOSPITAL_COMMUNITY): Payer: MEDICAID

## 2024-07-17 ENCOUNTER — Emergency Department (HOSPITAL_COMMUNITY)
Admission: EM | Admit: 2024-07-17 | Discharge: 2024-07-17 | Disposition: A | Discharge status: Home / Self Care | Payer: MEDICAID | Attending: Emergency Medicine | Admitting: Emergency Medicine

## 2024-07-17 ENCOUNTER — Encounter (HOSPITAL_COMMUNITY): Payer: Self-pay | Admitting: Emergency Medicine

## 2024-07-17 ENCOUNTER — Other Ambulatory Visit: Payer: Self-pay

## 2024-07-17 DIAGNOSIS — J069 Acute upper respiratory infection, unspecified: Secondary | ICD-10-CM | POA: Diagnosis not present

## 2024-07-17 DIAGNOSIS — J45909 Unspecified asthma, uncomplicated: Secondary | ICD-10-CM | POA: Diagnosis not present

## 2024-07-17 DIAGNOSIS — Z91040 Latex allergy status: Secondary | ICD-10-CM | POA: Diagnosis not present

## 2024-07-17 DIAGNOSIS — R051 Acute cough: Secondary | ICD-10-CM

## 2024-07-17 DIAGNOSIS — F17200 Nicotine dependence, unspecified, uncomplicated: Secondary | ICD-10-CM | POA: Insufficient documentation

## 2024-07-17 DIAGNOSIS — R059 Cough, unspecified: Secondary | ICD-10-CM | POA: Diagnosis present

## 2024-07-17 LAB — CBC
HCT: 38 % (ref 36.0–46.0)
Hemoglobin: 12.9 g/dL (ref 12.0–15.0)
MCH: 27.9 pg (ref 26.0–34.0)
MCHC: 33.9 g/dL (ref 30.0–36.0)
MCV: 82.3 fL (ref 80.0–100.0)
Platelets: 241 K/uL (ref 150–400)
RBC: 4.62 MIL/uL (ref 3.87–5.11)
RDW: 14.1 % (ref 11.5–15.5)
WBC: 15.5 K/uL — ABNORMAL HIGH (ref 4.0–10.5)
nRBC: 0 % (ref 0.0–0.2)

## 2024-07-17 LAB — BASIC METABOLIC PANEL WITH GFR
Anion gap: 9 (ref 5–15)
BUN: 11 mg/dL (ref 6–20)
CO2: 24 mmol/L (ref 22–32)
Calcium: 9.2 mg/dL (ref 8.9–10.3)
Chloride: 102 mmol/L (ref 98–111)
Creatinine, Ser: 0.86 mg/dL (ref 0.44–1.00)
GFR, Estimated: >60 mL/min (ref >60)
Glucose, Bld: 111 mg/dL — ABNORMAL HIGH (ref 70–99)
Potassium: 3.4 mmol/L — ABNORMAL LOW (ref 3.5–5.1)
Sodium: 135 mmol/L (ref 135–145)

## 2024-07-17 MED ORDER — BENZONATATE 100 MG PO CAPS: 100.0000 mg | ORAL_CAPSULE | Freq: Three times a day (TID) | ORAL | 0 refills | Status: DC

## 2024-07-17 MED ORDER — BENZONATATE 100 MG PO CAPS: 100.0000 mg | ORAL_CAPSULE | Freq: Once | ORAL | Status: DC

## 2024-07-17 NOTE — ED Provider Notes (Signed)
 [place] [facility] AT [facility] Provider Note   CSN: [id number] Arrival date & time: 07/17/24  0425     Patient presents with: Palpitations   Melissa [name] is a 23 y.o. female.  Patient with past medical history significant for TBI, anxiety, seizures, anemia, asthma presents to the emergency department complaining of 1 week of upper respiratory symptoms with coughing and pain in the ribs.  She states that she went to an urgent care on November 21 and was prescribed steroids and a prescription for doxycycline .  She states she continues to cough up mucus and has rib pain when coughing.  She also endorses some feelings of palpitations earlier this evening.  She denies abdominal pain, nausea, vomiting, fevers.    Palpitations      Prior to Admission medications   Medication Sig Start Date End Date Taking? Authorizing Provider  benzonatate  (TESSALON ) 100 MG capsule Take 1 capsule (100 mg total) by mouth every 8 (eight) hours. 07/17/24  Yes Logan Ubaldo NOVAK, PA-C  albuterol  (VENTOLIN  HFA) 108 (90 Base) MCG/ACT inhaler Inhale 1-2 puffs into the lungs every 6 (six) hours as needed for wheezing or shortness of breath. 07/11/24   Raspet, Erin K, PA-C  busPIRone  (BUSPAR ) 10 MG tablet Take 1 tablet (10 mg total) by mouth daily. 01/30/24   Nwoko, Uchenna E, PA  busPIRone  (BUSPAR ) 10 MG tablet Take 2 tablets (20 mg total) by mouth at bedtime. 01/30/24   Nwoko, Uchenna E, PA  doxycycline  (VIBRAMYCIN ) 100 MG capsule Take 1 capsule (100 mg total) by mouth 2 (two) times daily. 07/11/24   Raspet, Erin K, PA-C  hydrOXYzine  (ATARAX ) 25 MG tablet Take 1 tablet (25 mg total) by mouth 3 (three) times daily as needed for anxiety. 01/30/24   Nwoko, Uchenna E, PA  metroNIDAZOLE  (FLAGYL ) 500 MG tablet Take 1 tablet (500 mg total) by mouth 2 (two) times daily for 7 days. 07/14/24 07/21/24  Vonna Sharlet POUR, MD  nystatin  (MYCOSTATIN /NYSTOP ) powder Apply 1 Application topically 3  (three) times daily. 06/19/24   Johnie Flaming A, NP  risperiDONE  (RISPERDAL ) 2 MG tablet Take 1 tablet (2 mg total) by mouth at bedtime. 01/30/24   Nwoko, Uchenna E, PA  sertraline  (ZOLOFT ) 50 MG tablet Take 1 tablet (50 mg total) by mouth daily. 01/30/24   Nwoko, Uchenna E, PA  traZODone  (DESYREL ) 50 MG tablet Take 1 tablet (50 mg total) by mouth at bedtime as needed for sleep. 01/30/24   Nwoko, Uchenna E, PA  Vitamin D , Ergocalciferol , (DRISDOL ) 1.25 MG (50000 UNIT) CAPS capsule Take 1 capsule (50,000 Units total) by mouth every 7 (seven) days. 09/06/23   Tex Drilling, NP  rizatriptan  (MAXALT -MLT) 10 MG disintegrating tablet Take 1 tablet at onset of migraine with 2 ibuprofen  may repeat an additional tablet in 2 hours if needed 05/07/20 06/09/20  Susen Elsie DEL, MD    Allergies: Benadryl  [diphenhydramine  hcl], Haldol  [haloperidol ], Latex, Shellfish allergy, Zithromax [azithromycin], Camphor, Gluten meal, and Lactose intolerance (gi)    Review of Systems  Cardiovascular:  Positive for palpitations.    Updated Vital Signs BP (!) 132/92 (BP Location: Right Arm)   Pulse 92   Temp 98.4 F (36.9 C)   Resp (!) 22   Ht 5' 7 (1.702 m)   Wt 106.6 kg   LMP 07/02/2024 (Exact Date)   SpO2 99%   BMI 36.81 kg/m   Physical Exam Vitals and nursing note reviewed.  Constitutional:      General: She  is not in acute distress.    Appearance: She is well-developed.  HENT:     Head: Normocephalic and atraumatic.  Eyes:     Conjunctiva/sclera: Conjunctivae normal.  Cardiovascular:     Rate and Rhythm: Normal rate and regular rhythm.     Heart sounds: No murmur heard. Pulmonary:     Effort: Pulmonary effort is normal. No respiratory distress.     Breath sounds: Normal breath sounds. No wheezing, rhonchi or rales.  Chest:     Chest wall: No tenderness.  Abdominal:     Palpations: Abdomen is soft.     Tenderness: There is no abdominal tenderness.  Musculoskeletal:        General: No  swelling.     Cervical back: Neck supple.  Skin:    General: Skin is warm and dry.     Capillary Refill: Capillary refill takes less than 2 seconds.  Neurological:     Mental Status: She is alert.  Psychiatric:        Mood and Affect: Mood normal.     (all labs ordered are listed, but only abnormal results are displayed) Labs Reviewed  BASIC METABOLIC PANEL WITH GFR - Abnormal; Notable for the following components:      Result Value   Potassium 3.4 (*)    Glucose, Bld 111 (*)    All other components within normal limits  CBC - Abnormal; Notable for the following components:   WBC 15.5 (*)    All other components within normal limits    EKG: EKG Interpretation Date/Time:  Thursday July 17 2024 04:30:49 EST Ventricular Rate:  94 PR Interval:  146 QRS Duration:  88 QT Interval:  356 QTC Calculation: 445 R Axis:   62  Text Interpretation: Normal sinus rhythm Possible Left atrial enlargement Borderline ECG Confirmed by Midge Golas ([id number]) on 07/17/2024 4:54:36 AM  Radiology: [facility] Chest Portable 1 View Result Date: 07/17/2024 EXAM: 1 VIEW(S) XRAY OF THE CHEST 07/17/2024 05:13:24 AM COMPARISON: PA lateral chest 05/03/2024. CLINICAL HISTORY: SOB, palpitation. FINDINGS: LINES, TUBES AND DEVICES: Multiple overlying monitor leads. LUNGS AND PLEURA: No focal pulmonary opacity. No pleural effusion. No pneumothorax. HEART AND MEDIASTINUM: No acute abnormality of the cardiac and mediastinal silhouettes. BONES AND SOFT TISSUES: No acute osseous abnormality. IMPRESSION: 1. No acute cardiopulmonary findings. Stable chest. Electronically signed by: Francis Quam MD 07/17/2024 05:30 AM EST RP Workstation: [id number]     Procedures   Medications Ordered in the ED  benzonatate  (TESSALON ) capsule 100 mg (has no administration in time range)                                    Medical Decision Making Amount and/or Complexity of Data Reviewed Labs: ordered. Radiology:  ordered.  Risk Prescription drug management.   This patient presents to the ED for concern of shortness of breath with coughing and palpitations, this involves an extensive number of treatment options, and is a complaint that carries with it a high risk of complications and morbidity.  The differential diagnosis includes pneumonia, dysrhythmia, viral respiratory infection, others  Co morbidities / Chronic conditions that complicate the patient evaluation  As noted in HPI   Additional history obtained:  Additional history obtained from EMR External records from outside source obtained and reviewed including urgent care note, antibiotics including Rocephin  and doxycycline  were prescribed/administered due to concerns for possible STI   Lab Tests:  I  Ordered, and personally interpreted labs.  The pertinent results include: Leukocytosis with a white count of 15,500, likely partly due to recent Decadron  injection   Imaging Studies ordered:  I ordered imaging studies including chest x-ray I independently visualized and interpreted imaging which showed no acute findings I agree with the radiologist interpretation   Cardiac Monitoring: / EKG:  The patient was maintained on a cardiac monitor.  I personally viewed and interpreted the cardiac monitored which showed an underlying rhythm of: Sinus rhythm   Problem List / ED Course / Critical interventions / Medication management   I ordered medication including Tessalon     Social Determinants of Health:  Patient is a daily smoker   Test / Admission - Considered:  Patient with lung sounds clear to auscultation bilaterally.  She does have a mild leukocytosis, suspect some portion of this is related to her recent steroid injection.  No pneumonia on chest x-ray.  EKG reassuring.  Presentation most consistent with upper respiratory infection with cough.  Plan to prescribe Tessalon  Per[place] and recommend supportive care at home.  No  indication for further emergent workup or admission at this time. Patient left facility prior to receiving Tessalon  or discharge instructions      Final diagnoses:  Upper respiratory tract infection, unspecified type  Acute cough    ED Discharge Orders          Ordered    benzonatate  (TESSALON ) 100 MG capsule  Every 8 hours        07/17/24 0547               Logan Ubaldo NOVAK, PA-C 07/17/24 9365    Midge Golas, MD 07/17/24 [phone]

## 2024-07-17 NOTE — ED Triage Notes (Signed)
 Pt arrived POV, pt states that she has been having coughing up mucous and pain in ribs from coughing. Pt states that she started felling palpitations earlier tonight. No wheezing noted.   Pt went to Urgent care 11/21 and was given dose of steroids (and Rx) and Rx for Doxycycline .

## 2024-07-17 NOTE — Discharge Instructions (Signed)
 Your presentation this evening is most consistent with an upper respiratory infection.  This is likely viral and should resolve on its own with time as your body fights the infection.  I have prescribed a cough medication called Tessalon .  Please take as directed.  Follow-up with your primary care provider as needed for further evaluation.  Return to the emergency department if you develop any life-threatening symptoms.

## 2024-07-17 NOTE — ED Notes (Signed)
 Pt stated they needed to leave and left.

## 2024-07-31 ENCOUNTER — Ambulatory Visit (HOSPITAL_COMMUNITY)
Admission: EM | Admit: 2024-07-31 | Discharge: 2024-07-31 | Disposition: A | Discharge status: Home / Self Care | Payer: MEDICAID | Attending: Family Medicine | Admitting: Family Medicine

## 2024-07-31 ENCOUNTER — Encounter (HOSPITAL_COMMUNITY): Payer: Self-pay

## 2024-07-31 DIAGNOSIS — Z202 Contact with and (suspected) exposure to infections with a predominantly sexual mode of transmission: Secondary | ICD-10-CM | POA: Insufficient documentation

## 2024-07-31 DIAGNOSIS — R070 Pain in throat: Secondary | ICD-10-CM | POA: Insufficient documentation

## 2024-07-31 DIAGNOSIS — N760 Acute vaginitis: Secondary | ICD-10-CM | POA: Diagnosis present

## 2024-07-31 LAB — HIV ANTIBODY (ROUTINE TESTING W REFLEX): HIV Screen 4th Generation wRfx: NONREACTIVE

## 2024-07-31 LAB — POCT RAPID STREP A (OFFICE): Rapid Strep A Screen: NEGATIVE

## 2024-07-31 MED ORDER — LIDOCAINE HCL (PF) 1 % IJ SOLN
INTRAMUSCULAR | Status: AC
  Filled 2024-07-31: qty 2

## 2024-07-31 MED ORDER — DOXYCYCLINE HYCLATE 100 MG PO CAPS: 100.0000 mg | ORAL_CAPSULE | Freq: Two times a day (BID) | ORAL | 0 refills | Status: AC

## 2024-07-31 MED ORDER — CEFTRIAXONE SODIUM 500 MG IJ SOLR
500.0000 mg | INTRAMUSCULAR | Status: DC
  Administered 2024-07-31: 500 mg via INTRAMUSCULAR

## 2024-07-31 MED ORDER — CEFTRIAXONE SODIUM 500 MG IJ SOLR
INTRAMUSCULAR | Status: AC
  Filled 2024-07-31: qty 500

## 2024-07-31 NOTE — Discharge Instructions (Signed)
 Your strep test is negative.    Staff will notify you if there is anything positive on the swab or on the bloodwork. It can take 2-3 days for the tests to result, depending on the day of the week your test was taken. You will only be notified if there are any positives on the testing; test results will also go to your MyChart if you are signed up for MyChart.   You have been given a shot of ceftriaxone  500 mg  Take doxycycline  100 mg --1 capsule 2 times daily for 7 days

## 2024-07-31 NOTE — ED Triage Notes (Signed)
 Pt coming in today with c/o sore throat x 5 days, and has been running a fever. Has been having a cough on and off, denies chills and body aches. Has taken  ibuprofen  @ 3am, with no relief.Pt states, wants to get STD test due to new partner, would like swab and blood work done.

## 2024-07-31 NOTE — ED Provider Notes (Signed)
 [facility]    CSN: [id number] Arrival date & time: 07/31/24  1034      History   Chief Complaint Chief Complaint  Patient presents with   Sore Throat    std   SEXUALLY TRANSMITTED DISEASE    HPI Melissa [name] is a 23 y.o. female.    Sore Throat  Here for throat pain has been going on for about 5 days.  She has had subjective fever.  She did last take some ibuprofen  about 8 hours ago.  No chills or myalgia.  She has had some nasal congestion and cough that preceded the sore throat and that is improving though she is still coughing some.  She also has begun having some pelvic pain that is intermittent, just a pressure and vaginal irritation.  Her menstrual cycle started 2 days ago.  She is allergic to azithromycin and to Benadryl  and Haldol  and shellfish.  Past Medical History:  Diagnosis Date   Anemia    Anxiety    Asthma    inhaler. 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   Seizures ([name])    last one in 2015 - on meds   Sickle cell trait    TBI (traumatic brain injury) ([name]) 2006    Patient Active Problem List   Diagnosis Date Noted   Schizoaffective disorder, bipolar type ([name]) 08/29/2023   Alcohol use disorder 08/29/2023   Tobacco use 08/29/2023   Schizophrenia ([name]) 08/28/2023   Major depressive disorder, single episode, severe with psychotic features ([name]) 08/28/2023   MDD (major depressive disorder), recurrent severe,  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   Homelessness 03/29/2023   OCD (obsessive compulsive disorder) 11/23/2022   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   Delivery by emergency cesarean 06/05/2022   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   Vaginal bleeding during pregnancy 05/28/2022   Gonorrhea affecting pregnancy in second trimester 05/18/2022   ?fetal pericardial effusion 05/18/2022   [redacted] weeks gestation of pregnancy    Preterm premature rupture of membranes (PPROM) with unknown onset of labor 05/17/2022   Oligohydramnios antepartum 05/11/2022   Abnormal antenatal AFP screen 04/12/2022   History of cesarean section 04/07/2022   Bartholin cyst 04/07/2022   History of herpes genitalis 04/07/2022   Supervision of high risk pregnancy, antepartum 02/08/2022   Trichomonal vaginitis during pregnancy in second trimester 02/01/2022   History of postpartum depression 11/16/2021   Abnormal involuntary movements 08/04/2020   Migraine with aura and without status migrainosus, not intractable 08/12/2019   Seizures ([name])    Sickle cell trait 02/13/2019   Rh negative state in antepartum period 02/05/2019   Short interval between pregnancies affecting pregnancy in second trimester, antepartum 02/05/2019   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   DMDD (disruptive mood dysregulation disorder) 07/21/2016   Bipolar 1 disorder, mixed ([name]) 07/17/2016   Chronic constipation 08/03/2015   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   GAD (generalized anxiety disorder) 11/25/2013    Past Surgical History:  Procedure Laterality Date   APPENDECTOMY     CESAREAN SECTION N/A 08/31/2021   Procedure: CESAREAN SECTION;  Surgeon: Kandis Devaughn Sayres, MD;  Location: [facility];  Service: Obstetrics;  Laterality: N/A;   CESAREAN SECTION N/A 06/05/2022   Procedure: CESAREAN SECTION;  Surgeon: Lorence Ozell CROME, MD;  Location: [facility];  Service: Obstetrics;  Laterality: N/A;   HERNIA REPAIR     INTESTINAL MALROTATION REPAIR  2001    OB History     Gravida  4   Para  3   Term  2   Preterm  1   AB  1   Living  3      SAB  1   IAB      Ectopic      Multiple  0   Live Births  3            Home Medications    Prior to Admission medications  Medication Sig Start Date End Date Taking? Authorizing Provider  doxycycline  (VIBRAMYCIN ) 100 MG capsule Take 1 capsule (100 mg total) by mouth 2 (two) times daily for 7 days. 07/31/24 08/07/24 Yes Vonna Sharlet POUR, MD  albuterol  (VENTOLIN  HFA) 108 (90 Base) MCG/ACT inhaler Inhale 1-2 puffs into the lungs every 6 (six) hours as needed for wheezing or shortness of breath. 07/11/24   Raspet, Erin K, PA-C  busPIRone  (BUSPAR ) 10 MG tablet Take 1 tablet (10 mg total) by mouth daily. 01/30/24   Nwoko, Uchenna E, PA  busPIRone  (BUSPAR ) 10 MG tablet Take 2 tablets (20 mg total) by mouth at bedtime. 01/30/24   Nwoko, Uchenna E, PA  hydrOXYzine  (ATARAX ) 25 MG tablet Take 1 tablet (25 mg total) by mouth 3 (three) times daily as needed for anxiety. 01/30/24   Nwoko, Uchenna E, PA  nystatin  (MYCOSTATIN /NYSTOP ) powder Apply 1 Application topically 3 (three) times daily. 06/19/24   Johnie Flaming A, NP  risperiDONE  (RISPERDAL ) 2 MG tablet Take 1 tablet (2 mg total) by mouth at bedtime. 01/30/24   Nwoko, Uchenna E, PA  sertraline  (ZOLOFT ) 50 MG tablet Take 1 tablet (50 mg total) by mouth daily. 01/30/24   Nwoko, Uchenna E, PA  traZODone  (DESYREL ) 50 MG tablet Take 1 tablet (50 mg total) by mouth at bedtime as needed for sleep. 01/30/24   Nwoko, Uchenna E, PA  Vitamin D , Ergocalciferol , (DRISDOL ) 1.25 MG (50000 UNIT) CAPS capsule Take 1 capsule (50,000 Units total) by mouth every 7 (seven) days. 09/06/23   Tex Drilling, NP  rizatriptan  (MAXALT -MLT) 10 MG disintegrating tablet Take 1 tablet at onset of migraine with 2 ibuprofen  may repeat an additional  tablet in 2 hours if needed 05/07/20 06/09/20  Susen Elsie DEL, MD    Family History Family History  Problem Relation Age of Onset   Depression Mother    Stroke Mother    Obesity Mother    Post-traumatic stress disorder Mother    Anxiety disorder Mother    Hypertension Mother    Diabetes Mother    Schizophrenia Father    Kidney disease Father     Social History Social History[1]   Allergies   Benadryl  [diphenhydramine  hcl], Haldol  [haloperidol ], Latex, Shellfish allergy, Zithromax [azithromycin], Camphor, Gluten meal, and Lactose intolerance (gi)   Review of Systems Review of Systems   Physical Exam Triage Vital Signs ED Triage Vitals [07/31/24 1119]  Encounter Vitals Group  BP 106/68     Girls Systolic BP Percentile      Girls Diastolic BP Percentile      Boys Systolic BP Percentile      Boys Diastolic BP Percentile      Pulse Rate 84     Resp 17     Temp 98.2 F (36.8 C)     Temp Source Oral     SpO2 97 %     Weight      Height      Head Circumference      Peak Flow      Pain Score 9     Pain Loc      Pain Education      Exclude from Growth Chart    No data found.  Updated Vital Signs BP 106/68 (BP Location: Left Arm)   Pulse 84   Temp 98.2 F (36.8 C) (Oral)   Resp 17   LMP 07/29/2024 (Exact Date)   SpO2 97%   Visual Acuity Right Eye Distance:   Left Eye Distance:   Bilateral Distance:    Right Eye Near:   Left Eye Near:    Bilateral Near:     Physical Exam Vitals reviewed.  Constitutional:      General: She is not in acute distress.    Appearance: She is not toxic-appearing.  HENT:     Right Ear: Tympanic membrane and ear canal normal.     Left Ear: Tympanic membrane and ear canal normal.     Nose: Congestion present.     Mouth/Throat:     Mouth: Mucous membranes are moist.     Comments: There is mild erythema of the posterior oropharynx.  No tonsillar hypertrophy.  No white exudates on the tonsils. Eyes:     Extraocular  Movements: Extraocular movements intact.     Conjunctiva/sclera: Conjunctivae normal.     Pupils: Pupils are equal, round, and reactive to light.  Cardiovascular:     Rate and Rhythm: Normal rate and regular rhythm.     Heart sounds: No murmur heard. Pulmonary:     Effort: Pulmonary effort is normal. No respiratory distress.     Breath sounds: No stridor. No wheezing, rhonchi or rales.  Chest:     Chest wall: No tenderness.  Abdominal:     Palpations: Abdomen is soft.     Tenderness: There is no abdominal tenderness.  Musculoskeletal:     Cervical back: Neck supple.  Lymphadenopathy:     Cervical: No cervical adenopathy.  Skin:    Capillary Refill: Capillary refill takes less than 2 seconds.     Coloration: Skin is not jaundiced or pale.  Neurological:     General: No focal deficit present.     Mental Status: She is alert and oriented to person, place, and time.  Psychiatric:        Behavior: Behavior normal.      UC Treatments / Results  Labs (all labs ordered are listed, but only abnormal results are displayed) Labs Reviewed  HIV ANTIBODY (ROUTINE TESTING W REFLEX)  SYPHILIS: RPR W/REFLEX TO RPR TITER AND TREPONEMAL ANTIBODIES, TRADITIONAL SCREENING AND DIAGNOSIS ALGORITHM  POCT RAPID STREP A (OFFICE)  CERVICOVAGINAL ANCILLARY ONLY  CYTOLOGY, (ORAL, ANAL, URETHRAL) ANCILLARY ONLY    EKG   Radiology No results found.  Procedures Procedures (including critical care time)  Medications Ordered in UC Medications  cefTRIAXone  (ROCEPHIN ) injection 500 mg (has no administration in time range)    Initial  Impression / Assessment and Plan / UC Course  I have reviewed the triage vital signs and the nursing notes.  Pertinent labs & imaging results that were available during my care of the patient were reviewed by me and considered in my medical decision making (see chart for details).     Rapid strep is negative.  Vaginal self swab and oral cytology swab are both  done, and we will notify of any positives on that and treat per protocol.  She wished to be treated empirically, so Rocephin  is given IM and doxycycline  is sent to the pharmacy.  Final Clinical Impressions(s) / UC Diagnoses   Final diagnoses:  Throat pain  Acute vaginitis  Exposure to sexually transmitted disease (STD)     Discharge Instructions      Your strep test is negative.    Staff will notify you if there is anything positive on the swab or on the bloodwork. It can take 2-3 days for the tests to result, depending on the day of the week your test was taken. You will only be notified if there are any positives on the testing; test results will also go to your MyChart if you are signed up for MyChart.   You have been given a shot of ceftriaxone  500 mg  Take doxycycline  100 mg --1 capsule 2 times daily for 7 days       ED Prescriptions     Medication Sig Dispense Auth. Provider   doxycycline  (VIBRAMYCIN ) 100 MG capsule Take 1 capsule (100 mg total) by mouth 2 (two) times daily for 7 days. 14 capsule Vonna, Natara Monfort K, MD      PDMP not reviewed this encounter.     [1]  Social History Tobacco Use   Smoking status: Every Day    Current packs/day: 0.25    Types: Cigarettes   Smokeless tobacco: Never   Tobacco comments:    black and milds, 2 cigs daily  Vaping Use   Vaping status: Former  Substance Use Topics   Alcohol use: Yes    Comment: occasionally, prior to pregnancy   Drug use: Not Currently    Types: Marijuana    Comment: last 2020     Vonna Sharlet POUR, MD 07/31/24 1234

## 2024-08-01 ENCOUNTER — Ambulatory Visit (HOSPITAL_COMMUNITY): Payer: Self-pay

## 2024-08-01 LAB — CERVICOVAGINAL ANCILLARY ONLY
Bacterial Vaginitis (gardnerella): POSITIVE — AB
Candida Glabrata: NEGATIVE
Candida Vaginitis: NEGATIVE
Chlamydia: NEGATIVE
Comment: NEGATIVE
Comment: NEGATIVE
Comment: NEGATIVE
Comment: NEGATIVE
Comment: NEGATIVE
Comment: NORMAL
Neisseria Gonorrhea: NEGATIVE
Trichomonas: POSITIVE — AB

## 2024-08-01 LAB — CYTOLOGY, (ORAL, ANAL, URETHRAL) ANCILLARY ONLY
Chlamydia: NEGATIVE
Comment: NEGATIVE
Comment: NORMAL
Neisseria Gonorrhea: NEGATIVE

## 2024-08-01 LAB — SYPHILIS: RPR W/REFLEX TO RPR TITER AND TREPONEMAL ANTIBODIES, TRADITIONAL SCREENING AND DIAGNOSIS ALGORITHM: RPR Ser Ql: NONREACTIVE

## 2024-08-01 MED ORDER — METRONIDAZOLE 500 MG PO TABS: 500.0000 mg | ORAL_TABLET | Freq: Two times a day (BID) | ORAL | 0 refills | Status: AC

## 2024-08-04 MED ORDER — FLUCONAZOLE 150 MG PO TABS: 150.0000 mg | ORAL_TABLET | Freq: Once | ORAL | 0 refills | Status: AC

## 2024-08-05 ENCOUNTER — Ambulatory Visit: Payer: MEDICAID | Admitting: Neurology

## 2024-08-05 ENCOUNTER — Encounter: Payer: Self-pay | Admitting: Neurology

## 2024-08-05 VITALS — BP 101/70 | HR 89 | Ht 67.0 in | Wt 226.4 lb

## 2024-08-05 DIAGNOSIS — Z87898 Personal history of other specified conditions: Secondary | ICD-10-CM

## 2024-08-05 DIAGNOSIS — G43109 Migraine with aura, not intractable, without status migrainosus: Secondary | ICD-10-CM
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
## 2024-08-05 NOTE — Patient Instructions (Addendum)
 Good to meet you.  Schedule 1-hour EEG  2. You can drop off the DMV paperwork for the Neurology part and have you psychiatrist fill out the Psychiatry part  3. Follow-up in 1 year, call for any changes   Seizure Precautions: 1. If medication has been prescribed for you to prevent seizures, take it exactly as directed.  Do not stop taking the medicine without talking to your doctor first, even if you have not had a seizure in a long time.   2. Avoid activities in which a seizure would cause danger to yourself or to others.  Don't operate dangerous machinery, swim alone, or climb in high or dangerous places, such as on ladders, roofs, or girders.  Do not drive unless your doctor says you may.  3. If you have any warning that you may have a seizure, lay down in a safe place where you can't hurt yourself.    4.  No driving for 6 months from last seizure, as per [place]  state law.   Please refer to the following link on the Epilepsy Foundation of America's website for more information: http://www.epilepsyfoundation.org/answerplace/Social/driving/drivingu.cfm   5.  Maintain good sleep hygiene. Avoid alcohol  6.  Notify your neurology if you are planning pregnancy or if you become pregnant.  7.  Contact your doctor if you have any problems that may be related to the medicine you are taking.  8.  Call 911 and bring the patient back to the ED if:        A.  The seizure lasts longer than 5 minutes.       B.  The patient doesn't awaken shortly after the seizure  C.  The patient has new problems such as difficulty seeing, speaking or moving  D.  The patient was injured during the seizure  E.  The patient has a temperature over 102 F (39C)  F.  The patient vomited and now is having trouble breathing

## 2024-08-22 ENCOUNTER — Other Ambulatory Visit: Payer: MEDICAID

## 2024-08-22 ENCOUNTER — Encounter: Payer: Self-pay | Admitting: Neurology

## 2024-08-30 ENCOUNTER — Ambulatory Visit (HOSPITAL_COMMUNITY): Payer: MEDICAID

## 2024-09-09 ENCOUNTER — Emergency Department (HOSPITAL_COMMUNITY)
Admission: EM | Admit: 2024-09-09 | Discharge: 2024-09-09 | Disposition: A | Discharge status: Home / Self Care | Payer: MEDICAID | Attending: Emergency Medicine | Admitting: Emergency Medicine

## 2024-09-09 ENCOUNTER — Emergency Department (HOSPITAL_COMMUNITY): Payer: MEDICAID

## 2024-09-09 ENCOUNTER — Other Ambulatory Visit: Payer: MEDICAID

## 2024-09-09 ENCOUNTER — Other Ambulatory Visit: Payer: Self-pay

## 2024-09-09 ENCOUNTER — Encounter (HOSPITAL_COMMUNITY): Payer: Self-pay

## 2024-09-09 ENCOUNTER — Encounter: Payer: Self-pay | Admitting: Neurology

## 2024-09-09 DIAGNOSIS — Z91040 Latex allergy status: Secondary | ICD-10-CM | POA: Diagnosis not present

## 2024-09-09 DIAGNOSIS — N72 Inflammatory disease of cervix uteri: Secondary | ICD-10-CM | POA: Insufficient documentation

## 2024-09-09 DIAGNOSIS — B9689 Other specified bacterial agents as the cause of diseases classified elsewhere: Secondary | ICD-10-CM

## 2024-09-09 DIAGNOSIS — N760 Acute vaginitis: Secondary | ICD-10-CM | POA: Insufficient documentation

## 2024-09-09 DIAGNOSIS — N12 Tubulo-interstitial nephritis, not specified as acute or chronic: Secondary | ICD-10-CM | POA: Insufficient documentation

## 2024-09-09 DIAGNOSIS — R300 Dysuria: Secondary | ICD-10-CM | POA: Diagnosis present

## 2024-09-09 LAB — CBC
HCT: 37.8 % (ref 36.0–46.0)
Hemoglobin: 12.7 g/dL (ref 12.0–15.0)
MCH: 27.3 pg (ref 26.0–34.0)
MCHC: 33.6 g/dL (ref 30.0–36.0)
MCV: 81.3 fL (ref 80.0–100.0)
Platelets: 276 K/uL (ref 150–400)
RBC: 4.65 MIL/uL (ref 3.87–5.11)
RDW: 14.2 % (ref 11.5–15.5)
WBC: 12.3 K/uL — ABNORMAL HIGH (ref 4.0–10.5)
nRBC: 0 % (ref 0.0–0.2)

## 2024-09-09 LAB — BASIC METABOLIC PANEL WITH GFR
Anion gap: 11 (ref 5–15)
BUN: 11 mg/dL (ref 6–20)
CO2: 21 mmol/L — ABNORMAL LOW (ref 22–32)
Calcium: 9.4 mg/dL (ref 8.9–10.3)
Chloride: 101 mmol/L (ref 98–111)
Creatinine, Ser: 0.79 mg/dL (ref 0.44–1.00)
GFR, Estimated: >60 mL/min
Glucose, Bld: 88 mg/dL (ref 70–99)
Potassium: 3.8 mmol/L (ref 3.5–5.1)
Sodium: 134 mmol/L — ABNORMAL LOW (ref 135–145)

## 2024-09-09 LAB — WET PREP, GENITAL
Sperm: NONE SEEN
Trich, Wet Prep: NONE SEEN
WBC, Wet Prep HPF POC: >=10 — AB
Yeast Wet Prep HPF POC: NONE SEEN

## 2024-09-09 LAB — I-STAT CG4 LACTIC ACID, ED: Lactic Acid, Venous: 0.6 mmol/L (ref 0.5–1.9)

## 2024-09-09 LAB — HIV ANTIBODY (ROUTINE TESTING W REFLEX): HIV Screen 4th Generation wRfx: NONREACTIVE

## 2024-09-09 LAB — URINALYSIS, ROUTINE W REFLEX MICROSCOPIC
Bilirubin Urine: NEGATIVE
Glucose, UA: NEGATIVE mg/dL
Ketones, ur: NEGATIVE mg/dL
Nitrite: NEGATIVE
Protein, ur: 100 mg/dL — AB
RBC / HPF: >50 RBC/hpf (ref 0–5)
Specific Gravity, Urine: 1.01 (ref 1.005–1.030)
WBC, UA: >50 WBC/hpf (ref 0–5)
pH: 7 (ref 5.0–8.0)

## 2024-09-09 LAB — HCG, SERUM, QUALITATIVE: Preg, Serum: NEGATIVE

## 2024-09-09 LAB — SYPHILIS: RPR W/REFLEX TO RPR TITER AND TREPONEMAL ANTIBODIES, TRADITIONAL SCREENING AND DIAGNOSIS ALGORITHM: RPR Ser Ql: NONREACTIVE

## 2024-09-09 MED ORDER — IOHEXOL 350 MG/ML SOLN
75.0000 mL | Freq: Once | INTRAVENOUS | Status: AC | PRN
  Administered 2024-09-09: 75 mL via INTRAVENOUS

## 2024-09-09 MED ORDER — METRONIDAZOLE 500 MG PO TABS: 500.0000 mg | ORAL_TABLET | Freq: Once | ORAL | Status: DC

## 2024-09-09 MED ORDER — METRONIDAZOLE 500 MG PO TABS: 500.0000 mg | ORAL_TABLET | Freq: Two times a day (BID) | ORAL | 0 refills | Status: DC

## 2024-09-09 MED ORDER — CEFPODOXIME PROXETIL 200 MG PO TABS: 200.0000 mg | ORAL_TABLET | Freq: Two times a day (BID) | ORAL | 0 refills | Status: DC

## 2024-09-09 MED ORDER — SODIUM CHLORIDE 0.9 % IV BOLUS
1000.0000 mL | Freq: Once | INTRAVENOUS | Status: AC
  Administered 2024-09-09: 1000 mL via INTRAVENOUS

## 2024-09-09 MED ORDER — LACTATED RINGERS IV BOLUS: 1000.0000 mL | Freq: Once | INTRAVENOUS | Status: DC

## 2024-09-09 MED ORDER — DOXYCYCLINE HYCLATE 100 MG PO CAPS: 100.0000 mg | ORAL_CAPSULE | Freq: Two times a day (BID) | ORAL | 0 refills | Status: DC

## 2024-09-09 MED ORDER — DOXYCYCLINE HYCLATE 100 MG PO TABS: 100.0000 mg | ORAL_TABLET | Freq: Once | ORAL | Status: DC

## 2024-09-09 MED ORDER — ACETAMINOPHEN 325 MG PO TABS
650.0000 mg | ORAL_TABLET | Freq: Four times a day (QID) | ORAL | Status: AC | PRN
  Administered 2024-09-09: 650 mg via ORAL
  Filled 2024-09-09: qty 2

## 2024-09-09 MED ORDER — SODIUM CHLORIDE 0.9 % IV SOLN
1.0000 g | Freq: Once | INTRAVENOUS | Status: AC
  Administered 2024-09-09: 1 g via INTRAVENOUS
  Filled 2024-09-09: qty 10

## 2024-09-09 NOTE — ED Triage Notes (Signed)
 Pt is coming in for UTI symptoms x1 week, she mentions right sided flank pain with some dysuria. She also reports a fever at home but no fever upon arrival, she has not taken any medications today for the pain or reported fever.

## 2024-09-09 NOTE — ED Notes (Signed)
 Pt requested to AMA, relays she needs to be at work at 1400. Pt relays will check her mychart for results. Pt requested to get abx sent to [facility] on [address].  Provider notified.

## 2024-09-09 NOTE — ED Provider Notes (Signed)
 " [place] EMERGENCY DEPARTMENT AT [facility] Provider Note   CSN: [id number] Arrival date & time: 09/09/24  0600     Patient presents with: UTI   Melissa [name] is a 24 y.o. female.  {Add pertinent medical, surgical, social history, OB history to HPI:32947} HPI     Prior to Admission medications  Medication Sig Start Date End Date Taking? Authorizing Provider  albuterol  (VENTOLIN  HFA) 108 (90 Base) MCG/ACT inhaler Inhale 1-2 puffs into the lungs every 6 (six) hours as needed for wheezing or shortness of breath. 07/11/24   Raspet, Erin K, PA-C  busPIRone  (BUSPAR ) 10 MG tablet Take 1 tablet (10 mg total) by mouth daily. 01/30/24   Nwoko, Uchenna E, PA  busPIRone  (BUSPAR ) 10 MG tablet Take 2 tablets (20 mg total) by mouth at bedtime. 01/30/24   Nwoko, Uchenna E, PA  hydrOXYzine  (ATARAX ) 25 MG tablet Take 1 tablet (25 mg total) by mouth 3 (three) times daily as needed for anxiety. 01/30/24   Nwoko, Uchenna E, PA  nystatin  (MYCOSTATIN /NYSTOP ) powder Apply 1 Application topically 3 (three) times daily. 06/19/24   Johnie Flaming A, NP  risperiDONE  (RISPERDAL ) 2 MG tablet Take 1 tablet (2 mg total) by mouth at bedtime. 01/30/24   Nwoko, Uchenna E, PA  sertraline  (ZOLOFT ) 50 MG tablet Take 1 tablet (50 mg total) by mouth daily. 01/30/24   Nwoko, Uchenna E, PA  traZODone  (DESYREL ) 50 MG tablet Take 1 tablet (50 mg total) by mouth at bedtime as needed for sleep. 01/30/24   Nwoko, Uchenna E, PA  Vitamin D , Ergocalciferol , (DRISDOL ) 1.25 MG (50000 UNIT) CAPS capsule Take 1 capsule (50,000 Units total) by mouth every 7 (seven) days. 09/06/23   Tex Drilling, NP  rizatriptan  (MAXALT -MLT) 10 MG disintegrating tablet Take 1 tablet at onset of migraine with 2 ibuprofen  may repeat an additional tablet in 2 hours if needed 05/07/20 06/09/20  Susen Elsie DEL, MD    Allergies: Benadryl  [diphenhydramine  hcl], Haldol  [haloperidol ], Latex, Shellfish allergy, Zithromax [azithromycin],  Camphor, Gluten meal, and Lactose intolerance (gi)    Review of Systems  Updated Vital Signs BP 114/76   Pulse 92   Temp (!) 97.4 F (36.3 C)   Resp 18   SpO2 99%   Physical Exam Vitals and nursing note reviewed. Exam conducted with a chaperone present.  Constitutional:      General: She is not in acute distress.    Appearance: She is well-developed.  HENT:     Head: Normocephalic and atraumatic.  Eyes:     Conjunctiva/sclera: Conjunctivae normal.  Cardiovascular:     Rate and Rhythm: Normal rate and regular rhythm.     Heart sounds: No murmur heard. Pulmonary:     Effort: Pulmonary effort is normal. No respiratory distress.     Breath sounds: Normal breath sounds.  Abdominal:     Palpations: Abdomen is soft.     Tenderness: There is no abdominal tenderness.  Genitourinary:    Cervix: Cervical motion tenderness and discharge present. No cervical bleeding.     Adnexa: Right adnexa normal and left adnexa normal.     Comments: No adnexal TTP or fullness Musculoskeletal:        General: No swelling.     Cervical back: Neck supple.  Skin:    General: Skin is warm and dry.     Capillary Refill: Capillary refill takes less than 2 seconds.  Neurological:     Mental Status: She is alert.  Psychiatric:  Mood and Affect: Mood normal.     (all labs ordered are listed, but only abnormal results are displayed) Labs Reviewed  URINALYSIS, ROUTINE W REFLEX MICROSCOPIC - Abnormal; Notable for the following components:      Result Value   APPearance CLOUDY (*)    Hgb urine dipstick MODERATE (*)    Protein, ur 100 (*)    Leukocytes,Ua LARGE (*)    Bacteria, UA FEW (*)    All other components within normal limits  BASIC METABOLIC PANEL WITH GFR - Abnormal; Notable for the following components:   Sodium 134 (*)    CO2 21 (*)    All other components within normal limits  CBC - Abnormal; Notable for the following components:   WBC 12.3 (*)    All other components within  normal limits  HCG, SERUM, QUALITATIVE  HIV ANTIBODY (ROUTINE TESTING W REFLEX)  GC/CHLAMYDIA PROBE AMP ([place]) NOT AT [facility]    EKG: None  Radiology: No results found.  {Document cardiac monitor, telemetry assessment procedure when appropriate:32947} Procedures   Medications Ordered in the ED  acetaminophen  (TYLENOL ) tablet 650 mg (650 mg Oral Given 09/09/24 0615)      {Click here for ABCD2, HEART and other calculators REFRESH Note before signing:1}                              Medical Decision Making Amount and/or Complexity of Data Reviewed Labs: ordered. Radiology: ordered.  Risk Prescription drug management.   ***  {Document critical care time when appropriate  Document review of labs and clinical decision tools ie CHADS2VASC2, etc  Document your independent review of radiology images and any outside records  Document your discussion with family members, caretakers and with consultants  Document social determinants of health affecting pt's care  Document your decision making why or why not admission, treatments were needed:32947:::1}   Final diagnoses:  None    ED Discharge Orders     None        "

## 2024-09-09 NOTE — Discharge Instructions (Addendum)
 Symptoms are most consistent with pyelonephritis in the setting of urinary tract infection however you have been empirically covered for STIs with Rocephin  and doxycycline .  Your wet prep was positive for bacterial vaginosis.  Given your discomfort, consider developing pelvic inflammatory disease which changes the duration of your antibiotics (doxycycline  and Flagyl ) to a 14-day course.  We will prescribe a 10-day course of antibiotics to treat for pyelonephritis.  Return to the emergency department for any severe worsening symptoms.

## 2024-09-09 NOTE — ED Notes (Signed)
 Pt is also requesting to be tested for STD's as well now

## 2024-09-10 LAB — GC/CHLAMYDIA PROBE AMP (~~LOC~~) NOT AT ARMC
Chlamydia: NEGATIVE
Chlamydia: NEGATIVE
Comment: NEGATIVE
Comment: NORMAL
Comment: NEGATIVE
Comment: NORMAL
Neisseria Gonorrhea: NEGATIVE
Neisseria Gonorrhea: NEGATIVE

## 2024-09-14 LAB — CULTURE, BLOOD (ROUTINE X 2): Culture: NO GROWTH

## 2024-09-18 ENCOUNTER — Ambulatory Visit (HOSPITAL_COMMUNITY)
Admission: RE | Admit: 2024-09-18 | Discharge: 2024-09-18 | Disposition: A | Discharge status: Home / Self Care | Payer: MEDICAID | Source: Ambulatory Visit

## 2024-09-18 ENCOUNTER — Encounter (HOSPITAL_COMMUNITY): Payer: Self-pay

## 2024-09-18 VITALS — BP 114/81 | HR 86 | Temp 98.3°F | Resp 16

## 2024-09-18 DIAGNOSIS — Z113 Encounter for screening for infections with a predominantly sexual mode of transmission: Secondary | ICD-10-CM | POA: Insufficient documentation

## 2024-09-18 LAB — POCT URINALYSIS DIP (MANUAL ENTRY)
Bilirubin, UA: NEGATIVE
Glucose, UA: NEGATIVE mg/dL
Ketones, POC UA: NEGATIVE mg/dL
Nitrite, UA: NEGATIVE
Protein Ur, POC: 100 mg/dL — AB
Spec Grav, UA: 1.01
Urobilinogen, UA: 0.2 U/dL
pH, UA: 6

## 2024-09-18 MED ORDER — DOXYCYCLINE HYCLATE 100 MG PO CAPS: 100.0000 mg | ORAL_CAPSULE | Freq: Two times a day (BID) | ORAL | 0 refills | Status: DC

## 2024-09-18 MED ORDER — CEFPODOXIME PROXETIL 200 MG PO TABS: 200.0000 mg | ORAL_TABLET | Freq: Two times a day (BID) | ORAL | 0 refills | Status: AC

## 2024-09-18 MED ORDER — DOXYCYCLINE HYCLATE 100 MG PO CAPS: 100.0000 mg | ORAL_CAPSULE | Freq: Two times a day (BID) | ORAL | 0 refills | Status: AC

## 2024-09-18 MED ORDER — METRONIDAZOLE 500 MG PO TABS: 500.0000 mg | ORAL_TABLET | Freq: Two times a day (BID) | ORAL | 0 refills | Status: DC

## 2024-09-18 MED ORDER — METRONIDAZOLE 500 MG PO TABS: 500.0000 mg | ORAL_TABLET | Freq: Two times a day (BID) | ORAL | 0 refills | Status: AC

## 2024-09-18 NOTE — ED Provider Notes (Signed)
 " [facility]    CSN: [id number] Arrival date & time: 09/18/24  1541      History   Chief Complaint Chief Complaint  Patient presents with   SEXUALLY TRANSMITTED DISEASE    I need to be tested for bacterial vaginosis and yeast also and I'm having symptoms of a uti - Entered by patient    HPI NICKOLETTE ESPINOLA is a 24 y.o. female.   This 24 year old female is being seen for complaints of continued dysuria.  She would also like STI screening.  She was seen in the emergency department on 09/09/2024 for complaints of UTI and was diagnosed with pyelonephritis, bacterial vaginosis, cervicitis.  She had 3 antibiotics sent to pharmacy, however she reports she never picked these antibiotics up.  She reports that the pharmacy they were sent to is too far from her house.  She is requesting prescriptions be sent to [facility] at [facility].  She complains of continued urinary tract infection symptoms including dysuria.  She also complains of nausea, vaginal discharge and odor.  She reports continued fevers at home, but is afebrile in clinic today.  She denies headache, dizziness, chest pain, shortness of breath, abdominal pain, vomiting, diarrhea.  She has not taken any medication for her symptoms.     Past Medical History:  Diagnosis Date   Anemia    Anxiety    Asthma    inhaler. 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   Seizures ([name])    last one in 2015 - on meds    Sickle cell trait    TBI (traumatic brain injury) ([name]) 2006    Patient Active Problem List   Diagnosis Date Noted   Schizoaffective disorder, bipolar type ([name]) 08/29/2023   Alcohol use disorder 08/29/2023   Tobacco use 08/29/2023   Schizophrenia ([name]) 08/28/2023   Major depressive disorder, single episode, severe with psychotic features ([name]) 08/28/2023   MDD (major depressive disorder), recurrent severe, 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   Homelessness 03/29/2023   OCD (obsessive compulsive disorder) 11/23/2022   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   Delivery by emergency cesarean 06/05/2022   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   Vaginal bleeding during pregnancy 05/28/2022   Gonorrhea affecting pregnancy in second trimester 05/18/2022   ?fetal pericardial effusion 05/18/2022   [redacted] weeks gestation of pregnancy    Preterm premature rupture of membranes (PPROM) with unknown onset of labor 05/17/2022   Oligohydramnios antepartum 05/11/2022   Abnormal antenatal AFP screen 04/12/2022   History of cesarean section 04/07/2022   Bartholin cyst 04/07/2022   History of herpes genitalis 04/07/2022   Supervision of high risk pregnancy, antepartum 02/08/2022   Trichomonal vaginitis during pregnancy in second trimester 02/01/2022   History of postpartum depression 11/16/2021   Abnormal involuntary movements 08/04/2020   Migraine with aura and without status migrainosus, not intractable 08/12/2019   Seizures ([name])    Sickle cell trait 02/13/2019   Rh negative  state in antepartum period 02/05/2019   Short interval between pregnancies affecting pregnancy in second trimester, antepartum 02/05/2019   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   DMDD (disruptive mood dysregulation disorder) 07/21/2016   Bipolar 1 disorder, mixed ([name]) 07/17/2016   Chronic constipation 08/03/2015   Partial epilepsy with impairment of consciousness ([name])  04/16/2015   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    Past Surgical History:  Procedure Laterality Date   APPENDECTOMY     CESAREAN SECTION N/A 08/31/2021   Procedure: CESAREAN SECTION;  Surgeon: Kandis Devaughn Sayres, MD;  Location: [facility];  Service: Obstetrics;  Laterality: N/A;   CESAREAN SECTION N/A 06/05/2022   Procedure: CESAREAN SECTION;  Surgeon: Lorence Ozell CROME, MD;  Location: [facility];  Service: Obstetrics;  Laterality: N/A;   HERNIA REPAIR     INTESTINAL MALROTATION REPAIR  2001    OB History     Gravida  4   Para  3   Term  2   Preterm  1   AB  1   Living  3      SAB  1   IAB      Ectopic      Multiple  0   Live Births  3            Home Medications    Prior to Admission medications  Medication Sig Start Date End Date Taking? Authorizing Provider  cefpodoxime  (VANTIN ) 200 MG tablet Take 1 tablet (200 mg total) by mouth 2 (two) times daily for 10 days. 09/18/24 09/28/24 Yes Fujie Dickison C, FNP  albuterol  (VENTOLIN  HFA) 108 (90 Base) MCG/ACT inhaler Inhale 1-2 puffs into the lungs every 6 (six) hours as needed for wheezing or shortness of breath. 07/11/24   Raspet, Erin K, PA-C  busPIRone  (BUSPAR ) 10 MG tablet Take 1 tablet (10 mg total) by mouth daily. 01/30/24   Nwoko, Uchenna E, PA  busPIRone  (BUSPAR ) 10 MG tablet Take 2 tablets (20 mg total) by mouth at bedtime. 01/30/24   Nwoko, Uchenna E, PA  doxycycline  (VIBRAMYCIN ) 100 MG capsule Take 1 capsule (100 mg total) by mouth 2 (two) times daily for 14 days. 09/18/24 10/02/24  Eleana Tocco C, FNP  hydrOXYzine  (ATARAX ) 25 MG tablet Take 1 tablet (25 mg total) by mouth 3 (three) times daily as needed for anxiety. 01/30/24   Nwoko, Uchenna E, PA  metroNIDAZOLE  (FLAGYL ) 500 MG tablet Take 1 tablet (500 mg total) by mouth 2 (two) times daily for 14 days. 09/18/24 10/02/24  Brynlee Pennywell C, FNP  nystatin  (MYCOSTATIN /NYSTOP ) powder Apply 1 Application topically 3  (three) times daily. 06/19/24   Johnie Flaming A, NP  risperiDONE  (RISPERDAL ) 2 MG tablet Take 1 tablet (2 mg total) by mouth at bedtime. 01/30/24   Nwoko, Uchenna E, PA  sertraline  (ZOLOFT ) 50 MG tablet Take 1 tablet (50 mg total) by mouth daily. 01/30/24   Nwoko, Uchenna E, PA  traZODone  (DESYREL ) 50 MG tablet Take 1 tablet (50 mg total) by mouth at bedtime as needed for sleep. 01/30/24   Nwoko, Uchenna E, PA  Vitamin D , Ergocalciferol , (DRISDOL ) 1.25 MG (50000 UNIT) CAPS capsule Take 1 capsule (50,000 Units total) by mouth every 7 (seven) days. 09/06/23   Tex Drilling, NP  rizatriptan  (MAXALT -MLT) 10 MG disintegrating tablet Take 1 tablet at onset of migraine with 2 ibuprofen  may repeat an additional tablet in 2 hours if needed 05/07/20 06/09/20  Susen Fallow  VEAR, MD    Family History Family History  Problem Relation Age of Onset   Depression Mother    Stroke Mother    Obesity Mother    Post-traumatic stress disorder Mother    Anxiety disorder Mother    Hypertension Mother    Diabetes Mother    Schizophrenia Father    Kidney disease Father     Social History Social History[1]   Allergies   Benadryl  [diphenhydramine  hcl], Haldol  [haloperidol ], Latex, Shellfish allergy, Zithromax [azithromycin], Camphor, Gluten meal, and Lactose intolerance (gi)   Review of Systems Review of Systems   Physical Exam Triage Vital Signs ED Triage Vitals  Encounter Vitals Group     BP 09/18/24 1556 114/81     Girls Systolic BP Percentile --      Girls Diastolic BP Percentile --      Boys Systolic BP Percentile --      Boys Diastolic BP Percentile --      Pulse Rate 09/18/24 1556 86     Resp 09/18/24 1556 16     Temp 09/18/24 1556 98.3 F (36.8 C)     Temp Source 09/18/24 1556 Oral     SpO2 09/18/24 1556 98 %     Weight --      Height --      Head Circumference --      Peak Flow --      Pain Score 09/18/24 1554 9     Pain Loc --      Pain Education --      Exclude from Growth  Chart --    No data found.  Updated Vital Signs BP 114/81 (BP Location: Right Arm)   Pulse 86   Temp 98.3 F (36.8 C) (Oral)   Resp 16   LMP 09/16/2024 (Exact Date)   SpO2 98%   Visual Acuity Right Eye Distance:   Left Eye Distance:   Bilateral Distance:    Right Eye Near:   Left Eye Near:    Bilateral Near:     Physical Exam   UC Treatments / Results  Labs (all labs ordered are listed, but only abnormal results are displayed) Labs Reviewed  POCT URINALYSIS DIP (MANUAL ENTRY) - Abnormal; Notable for the following components:      Result Value   Color, UA red (*)    Blood, UA large (*)    Protein Ur, POC =100 (*)    Leukocytes, UA Large (3+) (*)    All other components within normal limits  URINE CULTURE  CERVICOVAGINAL ANCILLARY ONLY    EKG   Radiology No results found.  Procedures Procedures (including critical care time)  Medications Ordered in UC Medications - No data to display  Initial Impression / Assessment and Plan / UC Course  I have reviewed the triage vital signs and the nursing notes.  Pertinent labs & imaging results that were available during my care of the patient were reviewed by me and considered in my medical decision making (see chart for details).     Vitals and triage reviewed, patient is hemodynamically stable.  Urinalysis again for large leukocytes, 100 protein, large blood (patient is menstruating).  Urine culture will be sent.  Cervicovaginal swab obtained.  She failed to pick up prescriptions that were prescribed on 09/09/2024, therefore those prescriptions will be sent to updated pharmacy.  Prescription for cefpodoxime , doxycycline , metronidazole  sent.  Staff will contact for abnormal cervicovaginal swab results to initiate appropriate treatment.  Plan of care,  follow-up care, return precautions given, no questions at this time. Final Clinical Impressions(s) / UC Diagnoses   Final diagnoses:  Screening examination for STI      Discharge Instructions      Your urine shows you have a urinary tract infection.   Urine culture will be sent to confirm no change in antibiotic is needed.    You have been prescribed cefpodoxime , take twice a day for 10 days.   You have been prescribed doxycycline , take twice a day for 14 days.  You have been prescribed metronidazole , take twice a day for 14 days.    STD testing pending, this will take 2-3 days to result. We will only call you if your testing is positive for any infection(s) and we will provide treatment.  Avoid sexual intercourse until your STD results come back.  If any of your STD results are positive, you will need to avoid sexual intercourse for 7 days while you are being treated to prevent spread of STD.  Condom use is the best way to prevent spread of STDs. Notify partner(s) of any positive results.  Avoid drinking beverages that irritate the urinary tract like sodas, tea, coffee, or juice. Drink plenty of water  to stay well hydrated and prevent severe infection.  If you develop pain in your upper back on one side, fever despite taking antibiotic, nausea and vomiting where you cannot keep anything down for 24 hours, dizziness/severe headache, or decreased urinary output, please go to the ER. Follow-up with PCP.      ED Prescriptions     Medication Sig Dispense Auth. Provider   doxycycline  (VIBRAMYCIN ) 100 MG capsule  (Status: Discontinued) Take 1 capsule (100 mg total) by mouth 2 (two) times daily. 20 capsule Prabhjot Piscitello C, FNP   metroNIDAZOLE  (FLAGYL ) 500 MG tablet  (Status: Discontinued) Take 1 tablet (500 mg total) by mouth 2 (two) times daily. 14 tablet Dejanae Helser C, FNP   cefpodoxime  (VANTIN ) 200 MG tablet Take 1 tablet (200 mg total) by mouth 2 (two) times daily for 10 days. 20 tablet Cathline Dowen C, FNP   doxycycline  (VIBRAMYCIN ) 100 MG capsule Take 1 capsule (100 mg total) by mouth 2 (two) times daily for 14 days. 28 capsule Lucely Leard C,  FNP   metroNIDAZOLE  (FLAGYL ) 500 MG tablet Take 1 tablet (500 mg total) by mouth 2 (two) times daily for 14 days. 28 tablet Mikya Don C, FNP      PDMP not reviewed this encounter.     [1]  Social History Tobacco Use   Smoking status: Every Day    Current packs/day: 0.25    Types: Cigarettes   Smokeless tobacco: Never   Tobacco comments:    black and milds, 2 cigs daily  Vaping Use   Vaping status: Former  Substance Use Topics   Alcohol use: Yes    Comment: occasionally, prior to pregnancy   Drug use: Not Currently    Types: Marijuana    Comment: last 2020     Lennice Jon BROCKS, FNP 09/18/24 1709  "

## 2024-09-18 NOTE — Discharge Instructions (Addendum)
 Your urine shows you have a urinary tract infection.   Urine culture will be sent to confirm no change in antibiotic is needed.    You have been prescribed cefpodoxime , take twice a day for 10 days.   You have been prescribed doxycycline , take twice a day for 14 days.  You have been prescribed metronidazole , take twice a day for 14 days.    STD testing pending, this will take 2-3 days to result. We will only call you if your testing is positive for any infection(s) and we will provide treatment.  Avoid sexual intercourse until your STD results come back.  If any of your STD results are positive, you will need to avoid sexual intercourse for 7 days while you are being treated to prevent spread of STD.  Condom use is the best way to prevent spread of STDs. Notify partner(s) of any positive results.  Avoid drinking beverages that irritate the urinary tract like sodas, tea, coffee, or juice. Drink plenty of water  to stay well hydrated and prevent severe infection.  If you develop pain in your upper back on one side, fever despite taking antibiotic, nausea and vomiting where you cannot keep anything down for 24 hours, dizziness/severe headache, or decreased urinary output, please go to the ER. Follow-up with PCP.

## 2024-09-18 NOTE — ED Triage Notes (Signed)
 Patient presents for STI check and BV check x 4 days.  Patient is having discharge and odor.  Patient was in the ER for a UTI and feels like it has not gotten better.  Patient has not taken any medication.

## 2024-09-19 LAB — CERVICOVAGINAL ANCILLARY ONLY
Bacterial Vaginitis (gardnerella): POSITIVE — AB
Candida Glabrata: NEGATIVE
Candida Vaginitis: NEGATIVE
Chlamydia: NEGATIVE
Comment: NEGATIVE
Comment: NEGATIVE
Comment: NEGATIVE
Comment: NEGATIVE
Comment: NEGATIVE
Comment: NORMAL
Neisseria Gonorrhea: NEGATIVE
Trichomonas: POSITIVE — AB

## 2024-09-20 ENCOUNTER — Ambulatory Visit (HOSPITAL_COMMUNITY): Payer: Self-pay

## 2024-09-20 LAB — URINE CULTURE: Culture: >=100000 — AB

## 2024-10-28 ENCOUNTER — Ambulatory Visit: Payer: MEDICAID | Admitting: Neurology

## 2025-08-05 ENCOUNTER — Ambulatory Visit: Payer: Self-pay | Admitting: Neurology
